# Patient Record
Sex: Male | Born: 1950 | Race: White | Hispanic: No | Smoking: Former smoker
Health system: Southern US, Community
[De-identification: ages and names within clinical notes are randomized; demographics above are authoritative.]

## PROBLEM LIST (undated history)

## (undated) DIAGNOSIS — I509 Heart failure, unspecified: Secondary | ICD-10-CM

## (undated) DIAGNOSIS — I10 Essential (primary) hypertension: Secondary | ICD-10-CM

## (undated) DIAGNOSIS — I2510 Atherosclerotic heart disease of native coronary artery without angina pectoris: Secondary | ICD-10-CM

## (undated) DIAGNOSIS — E785 Hyperlipidemia, unspecified: Secondary | ICD-10-CM

## (undated) DIAGNOSIS — Z87442 Personal history of urinary calculi: Secondary | ICD-10-CM

## (undated) DIAGNOSIS — E119 Type 2 diabetes mellitus without complications: Secondary | ICD-10-CM

## (undated) HISTORY — DX: Atherosclerotic heart disease of native coronary artery without angina pectoris: I25.10

## (undated) HISTORY — PX: BILATERAL KNEE ARTHROSCOPY: SUR91

## (undated) HISTORY — DX: Type 2 diabetes mellitus without complications: E11.9

## (undated) HISTORY — PX: NOSE SURGERY: SHX723

## (undated) HISTORY — DX: Essential (primary) hypertension: I10

## (undated) HISTORY — DX: Hyperlipidemia, unspecified: E78.5

## (undated) HISTORY — PX: LAPAROSCOPIC INGUINAL HERNIA REPAIR: SUR788

## (undated) HISTORY — DX: Heart failure, unspecified: I50.9

## (undated) HISTORY — PX: CORONARY ANGIOPLASTY WITH STENT PLACEMENT: SHX49

---

## 2000-10-26 ENCOUNTER — Emergency Department (HOSPITAL_COMMUNITY): Admission: EM | Admit: 2000-10-26 | Discharge: 2000-10-27 | Payer: Self-pay | Admitting: Emergency Medicine

## 2007-10-14 DIAGNOSIS — E785 Hyperlipidemia, unspecified: Secondary | ICD-10-CM

## 2007-10-14 DIAGNOSIS — E119 Type 2 diabetes mellitus without complications: Secondary | ICD-10-CM

## 2007-10-14 DIAGNOSIS — M109 Gout, unspecified: Secondary | ICD-10-CM

## 2007-10-14 DIAGNOSIS — I10 Essential (primary) hypertension: Secondary | ICD-10-CM | POA: Insufficient documentation

## 2007-10-14 HISTORY — DX: Essential (primary) hypertension: I10

## 2007-10-14 HISTORY — DX: Gout, unspecified: M10.9

## 2007-10-14 HISTORY — DX: Type 2 diabetes mellitus without complications: E11.9

## 2007-10-14 HISTORY — DX: Hyperlipidemia, unspecified: E78.5

## 2008-08-23 ENCOUNTER — Ambulatory Visit: Payer: Self-pay | Admitting: Internal Medicine

## 2008-08-23 ENCOUNTER — Inpatient Hospital Stay (HOSPITAL_COMMUNITY): Admission: EM | Admit: 2008-08-23 | Discharge: 2008-08-27 | Payer: Self-pay | Admitting: Emergency Medicine

## 2008-08-24 ENCOUNTER — Encounter (INDEPENDENT_AMBULATORY_CARE_PROVIDER_SITE_OTHER): Payer: Self-pay | Admitting: Internal Medicine

## 2008-09-16 ENCOUNTER — Ambulatory Visit: Payer: Self-pay | Admitting: Cardiology

## 2008-09-17 ENCOUNTER — Encounter (HOSPITAL_COMMUNITY): Admission: RE | Admit: 2008-09-17 | Discharge: 2008-11-16 | Payer: Self-pay | Admitting: Internal Medicine

## 2008-11-10 ENCOUNTER — Ambulatory Visit: Payer: Self-pay | Admitting: Internal Medicine

## 2009-06-15 ENCOUNTER — Ambulatory Visit: Payer: Self-pay | Admitting: Internal Medicine

## 2009-06-15 DIAGNOSIS — I10 Essential (primary) hypertension: Secondary | ICD-10-CM

## 2009-06-15 DIAGNOSIS — I2510 Atherosclerotic heart disease of native coronary artery without angina pectoris: Secondary | ICD-10-CM | POA: Insufficient documentation

## 2009-06-15 DIAGNOSIS — E785 Hyperlipidemia, unspecified: Secondary | ICD-10-CM

## 2009-06-15 HISTORY — DX: Essential (primary) hypertension: I10

## 2009-06-15 HISTORY — DX: Atherosclerotic heart disease of native coronary artery without angina pectoris: I25.10

## 2009-07-19 ENCOUNTER — Encounter: Payer: Self-pay | Admitting: Internal Medicine

## 2009-08-05 ENCOUNTER — Encounter: Payer: Self-pay | Admitting: Cardiology

## 2009-08-06 ENCOUNTER — Telehealth: Payer: Self-pay | Admitting: Internal Medicine

## 2009-08-09 ENCOUNTER — Encounter: Payer: Self-pay | Admitting: Internal Medicine

## 2009-09-30 ENCOUNTER — Telehealth: Payer: Self-pay | Admitting: Internal Medicine

## 2009-10-22 ENCOUNTER — Ambulatory Visit (HOSPITAL_COMMUNITY): Admission: RE | Admit: 2009-10-22 | Discharge: 2009-10-22 | Payer: Self-pay | Admitting: General Surgery

## 2009-11-23 ENCOUNTER — Encounter: Payer: Self-pay | Admitting: Internal Medicine

## 2010-03-04 ENCOUNTER — Encounter: Payer: Self-pay | Admitting: Internal Medicine

## 2010-03-29 ENCOUNTER — Telehealth: Payer: Self-pay | Admitting: Internal Medicine

## 2010-07-04 ENCOUNTER — Ambulatory Visit: Payer: Self-pay | Admitting: Internal Medicine

## 2010-12-22 NOTE — Assessment & Plan Note (Signed)
Summary: f1y   Visit Type:  Follow-up Primary Provider:  Ria Clock MD  CC:  no compliants.  History of Present Illness: Mr. Tremont is a very pleasant 60 year old male with history of coronary artery disease status post non-ST-elevation myocardial infarction, October 2008, treated with two drug-eluting stents to the RCA and a stent to the mid LAD.  EF was 55%.  Also, has a history of hypertension (with white-coat component), hyperlipidemia, and diabetes.   He returns today for routine followup.  He is doing very well.  He is walking and working in the yard without any chest pain or dyspnea.  Not exerising much the past month due to heat. He has been taking his blood pressure at home and his average pressures are being 120-130/70s.  His cholesterol and diabetes has been followed by Dr. Ilsa Iha. Recently HgBA1c 6.1. Unable to tolerate Niaspan due to severe flushing.   Current Medications (verified): 1)  Norvasc 10 Mg Tabs (Amlodipine Besylate) .... Take 1 Tablet By Mouth Once A Day 2)  Plavix 75 Mg Tabs (Clopidogrel Bisulfate) .... Take 1 Tablet By Mouth Once A Day 3)  Coreg Cr 40 Mg Xr24h-Cap (Carvedilol Phosphate) .... Take One Tablet By Mouth Once Daily. 4)  Glipizide 10 Mg Xr24h-Tab (Glipizide) .... Take One Tablet By Mouth Once Daily. 5)  Micardis 80 Mg Tabs (Telmisartan) .... Take One Tablet By Mouth Daily 6)  Lantus 100 Unit/ml Soln (Insulin Glargine) .... Uad 7)  Crestor 20 Mg Tabs (Rosuvastatin Calcium) .... Take One Tablet By Mouth Daily. 8)  Aspirin 81 Mg Tbec (Aspirin) .... Take One Tablet By Mouth Daily  Allergies (verified): 1)  ! * Pt Can Not Take Asa 325mg /bleeding 2)  * Fish Oil  Review of Systems       As per HPI and past medical history; otherwise all systems negative.    Vital Signs:  Patient profile:   60 year old male Height:      73 inches Weight:      224 pounds BMI:     29.66 Pulse rate:   69 / minute BP sitting:   148 / 70  (left arm) Cuff size:    regular  Vitals Entered By: Hardin Negus, RMA (July 04, 2010 2:15 PM)  Physical Exam  General:  Gen: well appearing. no resp difficulty HEENT: normal Neck: supple. no JVD. Carotids 2+ bilat; not bruits. No lymphadenopathy or thryomegaly appreciated. Cor: PMI nondisplaced. Regular rate & rhythm. No rubs, murmur. +s4 Lungs: clear Abdomen: obese. soft, nontender, nondistended. Good bowel sounds. Extremities: no cyanosis, clubbing, rash, tr edema on left Neuro: alert & orientedx3, cranial nerves grossly intact. moves all 4 extremities w/o difficulty. affect pleasant    Impression & Recommendations:  Problem # 1:  CAD, NATIVE VESSEL (ICD-414.01) He remains active with no evidence of ischemia. Continue current regimen. We discussed f/u treadmill for screening but he is active enough without symptoms that i don't feel strongly about it.   Problem # 2:  HYPERTENSION, BENIGN (ICD-401.1) Blood pressure well controlled. Continue current regimen.  Problem # 3:  HYPERLIPIDEMIA-MIXED (ICD-272.4) Recent TC was 94. Looks great. Continue Crestor.   Other Orders: EKG w/ Interpretation (93000)  Patient Instructions: 1)  Your physician wants you to follow-up in:  1 year.  You will receive a reminder letter in the mail two months in advance. If you don't receive a letter, please call our office to schedule the follow-up appointment.

## 2010-12-22 NOTE — Letter (Signed)
Summary: Dr Jim Desanctis Rosenbower's Office Note  Dr Jim Desanctis Rosenbower's Office Note   Imported By: Roderic Ovens 12/30/2009 13:08:51  _____________________________________________________________________  External Attachment:    Type:   Image     Comment:   External Document

## 2010-12-22 NOTE — Progress Notes (Signed)
Summary: Pt returning call  Phone Note Call from Patient Call back at [phone]   Caller: Patient Summary of Call: Pt returning  call Initial call taken by: Judie Grieve,  Mar 29, 2010 10:10 AM  Follow-up for Phone Call        Left message to call back Meredith Staggers, RN  Mar 29, 2010 2:32 PM   spoke w/pt regarding labwork he states he can not tolerate fish oil Meredith Staggers, RN  Mar 29, 2010 3:03 PM    New Allergies: * FISH OIL New Allergies: * FISH OIL

## 2011-02-09 ENCOUNTER — Observation Stay (HOSPITAL_COMMUNITY)
Admission: EM | Admit: 2011-02-09 | Discharge: 2011-02-10 | DRG: 143 | Disposition: A | Discharge status: Home Health | Payer: BC Managed Care – PPO | Attending: Internal Medicine | Admitting: Internal Medicine

## 2011-02-09 ENCOUNTER — Emergency Department (HOSPITAL_COMMUNITY): Payer: BC Managed Care – PPO

## 2011-02-09 DIAGNOSIS — E119 Type 2 diabetes mellitus without complications: Secondary | ICD-10-CM | POA: Insufficient documentation

## 2011-02-09 DIAGNOSIS — I2510 Atherosclerotic heart disease of native coronary artery without angina pectoris: Secondary | ICD-10-CM | POA: Insufficient documentation

## 2011-02-09 DIAGNOSIS — I498 Other specified cardiac arrhythmias: Secondary | ICD-10-CM | POA: Insufficient documentation

## 2011-02-09 DIAGNOSIS — R079 Chest pain, unspecified: Principal | ICD-10-CM | POA: Insufficient documentation

## 2011-02-09 DIAGNOSIS — F411 Generalized anxiety disorder: Secondary | ICD-10-CM | POA: Insufficient documentation

## 2011-02-09 DIAGNOSIS — Z794 Long term (current) use of insulin: Secondary | ICD-10-CM | POA: Insufficient documentation

## 2011-02-09 DIAGNOSIS — M109 Gout, unspecified: Secondary | ICD-10-CM | POA: Insufficient documentation

## 2011-02-09 DIAGNOSIS — I252 Old myocardial infarction: Secondary | ICD-10-CM | POA: Insufficient documentation

## 2011-02-09 DIAGNOSIS — E785 Hyperlipidemia, unspecified: Secondary | ICD-10-CM | POA: Insufficient documentation

## 2011-02-09 DIAGNOSIS — I10 Essential (primary) hypertension: Secondary | ICD-10-CM | POA: Insufficient documentation

## 2011-02-09 LAB — POCT I-STAT, CHEM 8
Calcium, Ion: 1.15 mmol/L (ref 1.12–1.32)
Chloride: 106 mEq/L (ref 96–112)
Creatinine, Ser: 1.3 mg/dL (ref 0.4–1.5)
Glucose, Bld: 77 mg/dL (ref 70–99)
HCT: 45 % (ref 39.0–52.0)
Potassium: 3.7 mEq/L (ref 3.5–5.1)
Sodium: 146 mEq/L — ABNORMAL HIGH (ref 135–145)

## 2011-02-09 LAB — POCT CARDIAC MARKERS: Troponin i, poc: <0.05 ng/mL (ref 0.00–0.09)

## 2011-02-10 DIAGNOSIS — R079 Chest pain, unspecified: Secondary | ICD-10-CM

## 2011-02-10 LAB — DIFFERENTIAL
Basophils Absolute: 0 10*3/uL (ref 0.0–0.1)
Lymphs Abs: 1.8 10*3/uL (ref 0.7–4.0)
Neutrophils Relative %: 69 % (ref 43–77)

## 2011-02-10 LAB — COMPREHENSIVE METABOLIC PANEL
ALT: 20 U/L (ref 0–53)
Albumin: 4.3 g/dL (ref 3.5–5.2)
Alkaline Phosphatase: 60 U/L (ref 39–117)
Creatinine, Ser: 1.13 mg/dL (ref 0.4–1.5)
GFR calc Af Amer: >60 mL/min (ref >60)
GFR calc non Af Amer: >60 mL/min (ref >60)
Glucose, Bld: 71 mg/dL (ref 70–99)
Total Protein: 6.7 g/dL (ref 6.0–8.3)

## 2011-02-10 LAB — CARDIAC PANEL(CRET KIN+CKTOT+MB+TROPI)
CK, MB: 1.7 ng/mL (ref 0.3–4.0)
Total CK: 80 U/L (ref 7–232)
Troponin I: 0.02 ng/mL (ref 0.00–0.06)

## 2011-02-10 LAB — TSH: TSH: 2.587 u[IU]/mL (ref 0.350–4.500)

## 2011-02-10 LAB — HEMOGLOBIN A1C: Hgb A1c MFr Bld: 6.6 % — ABNORMAL HIGH (ref <5.7)

## 2011-02-10 LAB — GLUCOSE, CAPILLARY

## 2011-02-10 LAB — CK TOTAL AND CKMB (NOT AT ARMC)
CK, MB: 1.7 ng/mL (ref 0.3–4.0)
Relative Index: INVALID (ref 0.0–2.5)
Total CK: 98 U/L (ref 7–232)

## 2011-02-10 LAB — CBC
Hemoglobin: 15.6 g/dL (ref 13.0–17.0)
MCH: 31.5 pg (ref 26.0–34.0)

## 2011-02-10 LAB — LIPID PANEL
LDL Cholesterol: 33 mg/dL (ref 0–99)
Triglycerides: 149 mg/dL (ref <150)
VLDL: 30 mg/dL (ref 0–40)

## 2011-02-10 LAB — TROPONIN I: Troponin I: 0.01 ng/mL (ref 0.00–0.06)

## 2011-02-10 LAB — HEPARIN LEVEL (UNFRACTIONATED): Heparin Unfractionated: 0.3 IU/mL (ref 0.30–0.70)

## 2011-02-20 ENCOUNTER — Ambulatory Visit (HOSPITAL_COMMUNITY): Payer: BC Managed Care – PPO | Attending: Internal Medicine | Admitting: Radiology

## 2011-02-20 DIAGNOSIS — R079 Chest pain, unspecified: Secondary | ICD-10-CM | POA: Insufficient documentation

## 2011-02-20 DIAGNOSIS — I252 Old myocardial infarction: Secondary | ICD-10-CM

## 2011-02-20 DIAGNOSIS — R0789 Other chest pain: Secondary | ICD-10-CM

## 2011-02-20 DIAGNOSIS — E119 Type 2 diabetes mellitus without complications: Secondary | ICD-10-CM

## 2011-02-20 DIAGNOSIS — I2510 Atherosclerotic heart disease of native coronary artery without angina pectoris: Secondary | ICD-10-CM

## 2011-02-20 DIAGNOSIS — R0989 Other specified symptoms and signs involving the circulatory and respiratory systems: Secondary | ICD-10-CM

## 2011-02-20 MED ORDER — TECHNETIUM TC 99M TETROFOSMIN IV KIT
33.0000 | PACK | Freq: Once | INTRAVENOUS | Status: AC | PRN
  Administered 2011-02-20: 33 via INTRAVENOUS

## 2011-02-20 MED ORDER — TECHNETIUM TC 99M TETROFOSMIN IV KIT
11.0000 | PACK | Freq: Once | INTRAVENOUS | Status: AC | PRN
  Administered 2011-02-20: 11 via INTRAVENOUS

## 2011-02-20 NOTE — Progress Notes (Signed)
[facility] SITE 3 NUCLEAR MED [address] [place] [address] [phone]  Cardiology Nuclear Med Study  [name] [name] is a 60 y.o. male [id number] 12-31-1950 60 y.o.   Nuclear Med Background Indication for Stress Test:  Evaluation for Ischemia, Stent Patency and 02/10/11 Post Hospital: CP, R/O MI History: '09 Echo EF=55%,'09 NSTEMI>Heart Catheterization>PTCA/ Stents(RCA), residual mild-moderate CAD, EF=60% Cardiac Risk Factors: Family History - CAD, History of Smoking, Hypertension, IDDM Type 2 and Lipids  Symptoms:  Chest Pressure with Exertion, Dizziness, DOE, Fatigue, Fatigue with Exertion, Light-Headedness and Near Syncope   Nuclear Pre-Procedure Caffeine/Decaff Intake:  7:00pm NPO After: 8:00am   Lungs:  Clear IV 0.9% NS with Angio Cath:  18g  IV Site: R Antecubital  IV Started by:  Stanton Kidney, EMT-P  Chest Size (in):  46 Cup Size: n/a  Height: 6' (1.829 m)  Weight:  228 lb (103.42 kg)  BMI:  Body mass index is 30.92 kg/(m^2). Tech Comments:  Coreg held x 4 days, per patient.    Nuclear Med Study 1 or 2 day study: 1 day  Stress Test Type:  Stress  Reading MD: Dietrich Pates, MD  Order Authorizing Provider:  Dr. Arvilla Meres, MD  Resting Radionuclide: Technetium 23m Tetrofosmin  Resting Radionuclide Dose: 11 mCi   Stress Radionuclide:  Technetium 25m Tetrofosmin  Stress Radionuclide Dose: 33 mCi           Stress Protocol Rest HR: 67 Stress HR: 160  Rest BP: 151/81 Stress BP: 220/84  Exercise Time (min): 8:01 METS: 10.1          Dose of Adenosine (mg):  n/a Dose of Lexiscan: n/a mg  Dose of Atropine (mg): n/a Dose of Dobutamine:  n/a mcg/kg/min (at max HR)  Stress Test Technologist: Irean Hong, RN  Nuclear Technologist:  Doyne Keel, CNMT     Rest Procedure:  Myocardial perfusion imaging was performed at rest 45 minutes following the intravenous administration of Technetium 39m Tetrofosmin. Rest ECG: NSR  Stress Procedure:   The patient exercised for 8 minutes and 01 seconds.  The patient stopped due to DOE and denied any chest pain.  There were nonspecific ST-T wave changes. There was a hypertensive response to exercise. There were frequent PAC's, occasion PVC's, and 6 beat PSVT.  Technetium 35m Tetrofosmin was injected at peak exercise and myocardial perfusion imaging was performed after a brief delay. Stress ECG: No significant change from baseline ECG  QPS Raw Data Images:  Images were motion corrected.  SOft tissue (subcutaneous fat, diaphragm) surround heart. Stress Images:  Defect in the inferior wall (base,minimally mid), inferoseptal wall (base, mid) and apex.  Otherwise normal perfusion. Rest Images:  Incomplete improvement with increased counts in the mid inferior, mid inferoseptal wall. Subtraction (SDS):  Mild ischemia. Transient Ischemic Dilatation (Normal <1.22):  0.96 Lung/Heart Ratio (Normal <0.45):  0.37  Quantitative Gated Spect Images QGS EDV:  137 ml QGS ESV:  77 ml QGS cine images:  Hypokinesis in the inferior and septal walls. QGS EF: 44%  Impression Exercise Capacity:  Good exercise capacity. BP Response:  Normal blood pressure response. Clinical Symptoms:  No chest pain. ECG Impression:  No significant ST segment change suggestive of ischemia. Comparison with Prior Nuclear Study: No previous nuclear study performed  Overall Impression:  Scar with mild periinfarct ischemia in the inferior, inferoseptal walls.  Cannot exclude coexistent soft tissue attenuation.  Thinning at apex.  Consider echo to further define EF.    [name]  [employer]

## 2011-02-21 LAB — GLUCOSE, CAPILLARY: Glucose-Capillary: 146 mg/dL — ABNORMAL HIGH (ref 70–99)

## 2011-02-21 LAB — COMPREHENSIVE METABOLIC PANEL
Calcium: 9.7 mg/dL (ref 8.4–10.5)
Chloride: 108 mEq/L (ref 96–112)
GFR calc Af Amer: >60 mL/min (ref >60)
Glucose, Bld: 119 mg/dL — ABNORMAL HIGH (ref 70–99)
Sodium: 145 mEq/L (ref 135–145)

## 2011-02-21 LAB — CBC
HCT: 47.7 % (ref 39.0–52.0)
Hemoglobin: 16.8 g/dL (ref 13.0–17.0)
MCHC: 35.1 g/dL (ref 30.0–36.0)
Platelets: 119 10*3/uL — ABNORMAL LOW (ref 150–400)
RBC: 5.08 MIL/uL (ref 4.22–5.81)
RDW: 12.7 % (ref 11.5–15.5)
WBC: 5.9 10*3/uL (ref 4.0–10.5)

## 2011-02-21 LAB — DIFFERENTIAL
Basophils Relative: 1 % (ref 0–1)
Eosinophils Absolute: 0.2 10*3/uL (ref 0.0–0.7)
Eosinophils Relative: 3 % (ref 0–5)
Monocytes Absolute: 0.4 10*3/uL (ref 0.1–1.0)
Monocytes Relative: 7 % (ref 3–12)

## 2011-02-21 NOTE — Progress Notes (Signed)
NUC REPORT ROUTED TO DR. Gala Romney.Mirna Mires

## 2011-02-21 NOTE — Progress Notes (Signed)
Probably ok. Please pull images for me to review. Also check 2-d echo to assess EF better.

## 2011-02-22 ENCOUNTER — Other Ambulatory Visit: Payer: Self-pay | Admitting: Internal Medicine

## 2011-03-01 NOTE — Progress Notes (Signed)
Per Dr Gala Romney, he reviewed images would pt to have an office visit to discuss,

## 2011-03-01 NOTE — Progress Notes (Signed)
Left message to call back  

## 2011-03-02 ENCOUNTER — Telehealth: Payer: Self-pay | Admitting: Internal Medicine

## 2011-03-02 NOTE — Telephone Encounter (Signed)
Left message to call back  

## 2011-03-02 NOTE — Telephone Encounter (Signed)
Pt aware test abn. appt sch for Mon 4/16 at 3pm with Dr Gala Romney

## 2011-03-02 NOTE — Progress Notes (Signed)
Pt aware appt sch fro 4/16 at 3pm

## 2011-03-06 ENCOUNTER — Encounter: Payer: Self-pay | Admitting: Internal Medicine

## 2011-03-06 ENCOUNTER — Ambulatory Visit (INDEPENDENT_AMBULATORY_CARE_PROVIDER_SITE_OTHER): Payer: BC Managed Care – PPO | Admitting: Internal Medicine

## 2011-03-06 VITALS — BP 152/84 | HR 66 | Ht 72.0 in | Wt 228.0 lb

## 2011-03-06 DIAGNOSIS — I10 Essential (primary) hypertension: Secondary | ICD-10-CM

## 2011-03-06 DIAGNOSIS — I2510 Atherosclerotic heart disease of native coronary artery without angina pectoris: Secondary | ICD-10-CM

## 2011-03-06 MED ORDER — CARVEDILOL PHOSPHATE ER 10 MG PO CP24: 10.0000 mg | ORAL_CAPSULE | Freq: Every day | ORAL | Status: DC

## 2011-03-06 MED ORDER — SPIRONOLACTONE 25 MG PO TABS: ORAL_TABLET | ORAL | Status: DC

## 2011-03-06 NOTE — Patient Instructions (Signed)
Decrease Coreg CR 10mg  daily Start Spironolactone 25mg  1/2 tab daily Your physician recommends that you return for lab work in: 1 week (bmet 401.1) Your physician wants you to follow-up in: 6 months. You will receive a reminder letter in the mail two months in advance. If you don't receive a letter, please call our office to schedule the follow-up appointment.

## 2011-03-06 NOTE — Progress Notes (Signed)
HPI:  [name] is a very pleasant 60 year old male with history of coronary artery disease status post non-ST-elevation myocardial infarction, October 2008, treated with two drug-eluting stents to the RCA and a stent to the mid LAD.  EF was 55%.  Also, has a history of hypertension (with white-coat component), hyperlipidemia, and diabetes.  He was admitted in March with dizziness and staggering after mowing the grass. Also had some tightness. HR on admit was in low 40s and we cut Coreg back. Ruled out for MI with serial cardiac markers. Had f/u ETT/Myoview on 02/20/11, Walked 8:01. Stopped due to dyspnea. No CP.  BP 164/93 at baseline.went up to 220/84 in stage I. ECG normal. Images with mild reversible defect at the base of the infero-septal wall.   Returns today to discuss results. Feeling much better since Coreg cut back. Mowing the grass and doing other activities with no CP or dyspnea.   Following BP at home SBP 120-135. HRs in 50s    ROS: All systems negative except as listed in HPI, PMH and Problem List.  Past Medical History  Diagnosis Date  . CAD (coronary artery disease)     nonST elevated MI in Oct 2008 treated w/2 drug -eluting stents to the RCA and  mid LAD, EF 55%  . HTN (hypertension)   . Hyperlipidemia   . DM (diabetes mellitus)     Current Outpatient Prescriptions  Medication Sig Dispense Refill  . amLODipine (NORVASC) 10 MG tablet Take 10 mg by mouth daily.        Marland Kitchen aspirin 81 MG tablet Take 81 mg by mouth daily.        . carvedilol (COREG CR) 40 MG 24 hr capsule Take 40 mg by mouth daily.        Marland Kitchen glipiZIDE (GLUCOTROL) 10 MG tablet Take 10 mg by mouth daily.        . insulin glargine (LANTUS) 100 UNIT/ML injection Inject into the skin as directed.        Marland Kitchen PLAVIX 75 MG tablet TAKE ONE TABLET BY MOUTH ONE TIME DAILY  30 each  7  . rosuvastatin (CRESTOR) 20 MG tablet Take 20 mg by mouth daily.        Marland Kitchen telmisartan (MICARDIS) 80 MG tablet Take 80 mg by mouth daily.            PHYSICAL EXAM: Filed Vitals:   03/06/11 1535  BP: 152/84  Pulse: 66   General:  Well appearing. No resp difficulty HEENT: normal Neck: supple. JVP flat. Carotids 2+ bilaterally; no bruits. No lymphadenopathy or thryomegaly appreciated. Cor: PMI normal. Regular rate & rhythm. No rubs, gallops or murmurs. Lungs: clear Abdomen: soft, nontender, nondistended. No hepatosplenomegaly. No bruits or masses. Good bowel sounds. Extremities: no cyanosis, clubbing, rash, edema Neuro: alert & orientedx3, cranial nerves grossly intact. Moves all 4 extremities w/o difficulty. Affect pleasant.    ECG:   ASSESSMENT & PLAN:

## 2011-03-06 NOTE — Assessment & Plan Note (Signed)
We reviewed stress test together and I told him I felt this was very low-risk. As his symptoms have totally resolved with decrease of Coreg would not pursue cath at this time. Continue RF management.

## 2011-03-06 NOTE — Assessment & Plan Note (Signed)
He has a significant white-coat component. Given how much better he feels on lower dose of Coreg will decrease down to 10 qd and add spiro 12.5 daily. Check labs 1 week. Keep on eye BP.

## 2011-03-15 ENCOUNTER — Other Ambulatory Visit (INDEPENDENT_AMBULATORY_CARE_PROVIDER_SITE_OTHER): Payer: BC Managed Care – PPO | Admitting: *Deleted

## 2011-03-15 DIAGNOSIS — I10 Essential (primary) hypertension: Secondary | ICD-10-CM

## 2011-03-16 LAB — BASIC METABOLIC PANEL
Calcium: 9.3 mg/dL (ref 8.4–10.5)
Creatinine, Ser: 1.1 mg/dL (ref 0.4–1.5)
GFR: 73.79 mL/min (ref >60.00)
Sodium: 145 mEq/L (ref 135–145)

## 2011-03-17 NOTE — H&P (Signed)
NAME:  [name] [name], [name] [name] NO.:  [id number]  MEDICAL RECORD NO.:  [id number]           PATIENT TYPE:  E  LOCATION:  [facility]                         FACILITY:  [facility]  PHYSICIAN:  Rollene Rotunda, MD, FACCDATE OF BIRTH:  [birth year]  DATE OF ADMISSION:  02/09/2011 DATE OF DISCHARGE:                             HISTORY & PHYSICAL   PRIMARY PHYSICIAN:  Dalbert Mayotte, MD  CARDIOLOGIST:  Bevelyn Buckles. Bensimhon, MD  REASON FOR PRESENTATION:  Evaluate the patient with chest pain and known coronary artery disease.  HISTORY OF PRESENT ILLNESS:  The patient is a 60 year old with history of coronary artery disease as described below.  He has been having weakness for couple of days.  He has felt lethargic and slightly dizzy. He did see his primary care doctor and labs were pending.  Today, he had some chest discomfort.  This was while mowing his lawn.  He had some mild tightness like he could not get a deep breath.  He was a little bit dyspneic.  He was anxious.  He did not have any excessive diaphoresis, nausea or vomiting.  There was no arm or jaw discomfort.  He thought it was slightly reminiscent of his previous angina though this was mild. He had otherwise been active, walking up until about a week ago.  He had not had any discomfort with this.  He has not had any resting shortness of breath, PND or orthopnea.  He was having some discomfort in the emergency room without acute EKG changes.  Point-of-care markers were negative x1.  He currently is pain free.  He did receive aspirin and a sublingual nitroglycerin.  He is not sure when the pain disappeared.  He does report increased anxiety recently.  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  PAST SURGICAL HISTORY:  Hemorrhoids, nasal surgery, inguinal hernia repair x2, right elbow surgery.  ALLERGIES:  Intolerances none.  MEDICATIONS: 1. Aspirin 81 mg daily. 2. NovoLog insulin 17 units nightly. 3. Micardis 80 mg daily. 4. Glipizide 10 mg daily. 5. Crestor 20 mg daily. 6. Plavix 75 mg daily. 7. Norvasc 10 mg daily. 8. Coreg 40 mg daily.  SOCIAL HISTORY:  The patient is married.  He does not smoke cigarettes, quitting 25 years ago.  FAMILY HISTORY:  Contributory for his father dying with myocardial infarction age 84.  REVIEW OF SYSTEMS:  As stated in the HPI negative for all other systems.  PHYSICAL EXAMINATION:  GENERAL:  The patient is pleasant and in no distress. VITAL SIGNS:  Blood pressure 158/86, heart rate 64 and regular, afebrile, respiratory rate 16. HEENT:  Eyes are unremarkable.  Pupils equal, round, and reactive to light.  Fundi not visualized.  Oral mucosa unremarkable. NECK:  No jugular venous distention at 45 degrees.  Carotid upstroke brisk and symmetrical.  No bruits and no thyromegaly.  LYMPHATICS:  No cervical,  axillary, or inguinal adenopathy. LUNGS:  Clear to auscultation bilaterally. BACK:  No costovertebral angle tenderness. CHEST:  Unremarkable. HEART:  PMI not displaced or sustained.  S1 and S2 within normal limits. No S3.  No S4.  No clicks, rubs, or murmurs.  ABDOMEN:  Flat, positive bowel sounds normal in frequency and pitch.  No bruits, no rebound, no guarding, no midline pulsatile mass, no hepatomegaly, no splenomegaly. SKIN:  No rashes.  No nodules. EXTREMITIES:  Pulses 2+ throughout.  No edema, no cyanosis, no clubbing. NEURO:  Oriented to person, place, and time.  Cranial nerves II through XII are grossly intact.  Motor grossly intact.  EKG:  Sinus bradycardia, rate 50, axis within normal  limits, intervals within normal limits, no acute ST-T wave changes, bifid T-wave.  LABORATORY DATA:  Point-of-care markers negative x1.  Sodium 146, potassium 3.7, BUN 17, creatinine 1.3.  CHEST X-RAY:  No acute disease.  ASSESSMENT AND PLAN: 1. Chest comfort.  The patient's chest discomfort has atypical greater     than typical features, though it is somewhat reminiscent to this     previous angina.  The predominance of complaints have been     dizziness and lethargy.  At this point, given his problems with     catheterization previously, if he does not have any objective     evidence of ischemia, I would suggest inpatient stress perfusion     imaging prior to discharge.  If, however, of course he has any     acute EKG changes or enzyme elevations, he would need cardiac     catheterization.  He might suggest holding his ARB at that time and     hydrating.  He needs continued risk reduction. 2. Diabetes.  He will continue his previous insulin regimen with     sliding scale coverage.  We will check hemoglobin A1c. 3. Dyslipidemia.  I will continue his current statin and check a     fasting lipid profile. 4. Bradycardia.  He has been lightheaded and fatigued.  His heart rate     at home has been in the 60s to 50s.  His     blood pressures have been controlled.  He will likely need his dose     of carvedilol reduced at discharge.  I will hold it in the hospital     for heart rate less than 55.  He will have a TSH. 5. Thrombocytopenia.  CBC is pending.     Rollene Rotunda, MD, [facility]     JH/MEDQ  D:  02/10/2011  T:  02/10/2011  Job:  [id number]  Electronically Signed by Rollene Rotunda MD [facility] on 03/17/2011 11:43:25 AM

## 2011-03-21 NOTE — Discharge Summary (Signed)
NAME:  [name] [name] NO.:  [id number]  MEDICAL RECORD NO.:  [id number]           PATIENT TYPE:  I  LOCATION:  2011                         FACILITY:  [facility]  PHYSICIAN:  Bevelyn Buckles. Kathryn Cosby, MDDATE OF BIRTH:  [birth year]  DATE OF ADMISSION:  02/09/2011 DATE OF DISCHARGE:  02/10/2011                              DISCHARGE SUMMARY   PRIMARY CARE PHYSICIAN:  Dalbert Mayotte, MD  PRIMARY CARDIOLOGIST:  Bevelyn Buckles. Artie Mcintyre, MD  DISCHARGE DIAGNOSES: 1. Chest pain, low risk for cardiac etiology.     a.     Ruled out with serial negative cardiac enzymes, stress      Myoview scheduled for February 20, 2011, at [facility]. 2. Elevated TSH.     a.     TSH 5.306, symptoms consistent with hypothyroidism, follow      up with primary care physician. 3. Bradycardia.     a.     Long-acting Coreg, decreased from 40 to 20 mg p.o. daily.  SECONDARY DIAGNOSES: 1. Coronary artery disease.a.     Status post non-ST segment elevation myocardial infarction,      August 2009, obtuse marginal 40%, diagonal mid 70%, distal 90%,      stenosis, Atrioventricular groove 50% stenosis, right coronary      artery occluded, percutaneous coronary intervention of right      coronary artery with two overlapping drug-eluting stents. 2. Insulin-dependent diabetes mellitus. 3. Hypertension. 4. Hyperlipidemia. 5. Gout. 6. Anxiety. 7. Mild thrombocytopenia. 8. Renal insufficiency (following catheterization).  ALLERGIES:  NKDA.  PROCEDURES: 1. EKG, February 09, 2011:  Sinus bradycardia, 50 bpm, no acute ST-T wave     changes or significant Q-waves.  Normal axis, no evidence of     hypertrophy, PR 166, QRS 98, and QTc of 419. 2. Chest x-ray, February 09, 2011:  No acute disease. 3. EKG, February 10, 2011:  No significant change from prior tracing.  HISTORY OF PRESENT ILLNESS:  [name] is a 60 year old gentleman with the above-noted complex medical history who presented to [facility] ED in the late  evening of February 09, 2011, complaining of chest discomfort as well as weakness, lethargy, and dizziness.  He saw his primary care physician recently and labs were done, but he is unaware of these results.  On the day of his presentation, he was mowing his lawn and had chest tightness and felt like he could not take a deep breath.  He was mildly dyspneic and anxious.  No diaphoresis, nausea, or vomiting.  No radiation to the arm or jaw.  He thought it was somewhat reminiscent of his prior angina and decided to present to [facility] ED for further evaluation.  The patient denies any symptoms recently with activity including walking.  No resting shortness of breath, PND, or orthopnea either.  When evaluated by Cardiology, he continued to have some mild chest discomfort but was without objective evidence of cardiac etiology (no EKG changes or elevation to initial point-of-care markers).  He was treated with aspirin and sublingual nitroglycerin in the emergency department.  He is unsure of when his pain totally resolved, but did resolve in the  emergency department.  He has had increased anxiety recently as well.  HOSPITAL COURSE:  The patient was admitted and was ruled out with serial negative cardiac enzymes.  He was seen in the morning of February 10, 2011, by his primary cardiologist, Dr. Arvilla Meres.  Dr. Gala Romney felt that his chest pain was mostly atypical and as the patient was anxious to get home, it was felt that he was appropriate for outpatient risk stratification with a nuclear treadmill stress Myoview.  The patient had continued bradycardia down to the 40's even off his beta-blocker.  The patient had his long-acting Coreg decreased from 40 to 20 mg p.o. daily. At the time of discharge, the patient received his new medication list, followup instructions including instructions to follow up with primary care provider (TSH noted to be slightly elevated and symptoms consistent with  hypothyroidism) in 1-2 weeks.  The patient has treadmill Myoview on February 20, 2011, at 12:30 p.m.  He also had all of his questions and concerns answered prior to his leaving the hospital.  DISCHARGE LABORATORIES:  WBC is 8.7, HGB 15.6, HCT 44.6, PLT count is 136, WBC differential was within normal limits.  Sodium 144, potassium 3.8, chloride 109, bicarb 30, BUN 15, creatinine 1.13, glucose 71. Liver function tests within normal limits.  Total protein 6.7, albumin 4.3, calcium 9.3.  Point-of-care markers negative x1 and two full sets of cardiac enzymes also negative.  Total cholesterol 93, triglycerides 149, HDL 30, LDL 33, total cholesterol/HDL ratio of 3.1.  TSH 5.306.  FOLLOWUP PLANS AND APPOINTMENTS: 1. Dalbert Mayotte, MD (primary care physician), the patient will call     and set up appointment in the next 1-2 weeks. 2. [facility], nuclear treadmill stress test, February 20, 2011,     at 12:30 p.m.  DISCHARGE MEDICATIONS: 1. Coreg CR 20 mg p.o. daily. 2. Aspirin 81 mg p.o. nightly. 3. Crestor 20 mg p.o. nightly. 4. Glipizide XL 10 mg p.o. nightly. 5. Lantus insulin 17 units subcu injection nightly. 6. Micardis 80 mg 1 tablet p.o. nightly. 7. Norvasc 10 mg p.o. nightly. 8. Plavix 75 mg p.o. daily.  DURATION OF DISCHARGE ENCOUNTER INCLUDING PHYSICIAN TIME:  35 minutes.     Jarrett Ables, PAC   ______________________________ Bevelyn Buckles. Saryna Kneeland, MD    MS/MEDQ  D:  02/10/2011  T:  02/11/2011  Job:  [id number]  cc:   Dalbert Mayotte, M.D.  Electronically Signed by Jarrett Ables PAC on 02/18/2011 02:49:10 PM Electronically Signed by Arvilla Meres MD on 03/21/2011 07:08:07 PM

## 2011-04-04 NOTE — Assessment & Plan Note (Signed)
[facility]                            CARDIOLOGY OFFICE NOTE   NAME:Powell, DARRIS STAIGER                   MRN:          [id number]  DATE:11/10/2008                            DOB:          06/28/1951    PRIMARY CARE PHYSICIAN:  Dalbert Mayotte, MD   INTERVAL HISTORY:  Mr. Dommer is a very pleasant 60 year old male with  history of coronary artery disease status post non-ST-elevation  myocardial infarction, October 2008, treated with two drug-eluting  stents to the RCA and a stent to the mid LAD.  EF was 55%.  Also, has a  history of hypertension, hyperlipidemia, and diabetes.   He returns today for routine followup.  He is doing well.  He is  exercising without any chest pain or dyspnea.  He has been taking his  blood pressure at home and his average pressures are being 130/70.  His  cholesterol has been followed by Dr. Drue Second.  He tells me recently that  his total cholesterol has been in the 80s.   CURRENT MEDICATIONS:  1. Aspirin 81 a day.  2. Plavix 75 a day.  3. Crestor 20 a day.  4. Coreg CR 40 a day.  5. Norvasc 5 a day.  6. Niacin 1000 mg a day.  7. Glipizide 10 a day.  8. Micardis 80 a day.  9. Lantus insulin.   PHYSICAL EXAMINATION:  GENERAL:  He is well-appearing in no acute  distress.  He ambulates around the clinic without respiratory  difficulty.  VITAL SIGNS:  Blood pressure is 150/86, heart rate 75.  HEENT:  Normal.  NECK:  Supple.  No JVD.  CARDIAC:  PMI is nondisplaced.  Irregular rate and rhythm.  No S4, no  murmur.  LUNGS:  Clear.  ABDOMEN:  Obese, nontender, nondistended.  No hepatosplenomegaly, no  bruits, no masses.  Good bowel sounds.  EXTREMITIES:  Warm.  No cyanosis, clubbing, or edema.  No rash.  NEURO:  Alert and oriented x3.  Cranial nerves II through XII intact.  Moves all 4 extremities without difficulty.  Affect is pleasant.   ASSESSMENT AND PLAN:  1. Coronary artery disease.  He is doing well.  No  evidence of      ischemia.  Continue current therapy.  2. Hypertension.  Blood pressure is mildly elevated, increase Norvasc      to 10.  3. Hyperlipidemia, followed by Dr. Drue Second.  It sounds like his lipids      are well controlled.  Goal LDL is less than 70.   DISPOSITION:  We will see him back in 1 year for routine followup.     Bevelyn Buckles. Bensimhon, MD  Electronically Signed    DRB/MedQ  DD: 11/10/2008  DT: 11/11/2008  Job #: [id number]   cc:   Dalbert Mayotte, M.D.

## 2011-04-04 NOTE — Cardiovascular Report (Signed)
NAME:  MCKADE, GURKA NO.:  [id number]   MEDICAL RECORD NO.:  [id number]          PATIENT TYPE:  INP   LOCATION:  2807                         FACILITY:  [facility]   PHYSICIAN:  Bevelyn Buckles. Bensimhon, MDDATE OF BIRTH:  [birth year]   DATE OF PROCEDURE:  08/24/2008  DATE OF DISCHARGE:                            CARDIAC CATHETERIZATION   PATIENT IDENTIFICATION:  Mr. Kliebert is well as a very pleasant 60-year-  old male with a history of hypertension and hyperlipidemia, as well as  diabetes, but no known history of coronary artery disease who was  admitted with a non-ST elevation myocardial infarction.   PROCEDURES PERFORMED:  1. Selective coronary angiography.  2. Left heart cath.  3. Left ventriculogram.   DESCRIPTION OF PROCEDURE:  The risks and indications of the  catheterization were explained.  Consent was signed and placed on the  chart.  A 6-French arterial sheath was placed in the right femoral  artery using the modified Seldinger technique.  Standard catheters  including JL-4, JR-4 and angled pigtail were used for the procedure.  All catheter exchanges were made over wire.  No apparent complications.   HEMODYNAMICS:  Central aortic pressure 94/57 with a mean of 74.  LV  pressure 97/11 with an EDP of 20.  There was no aortic stenosis.   Left main had a 40% mid-lesion and a 40% distal lesion.   LAD was a long vessel wrapping the apex.  It gave off a large diagonal  in the proximal portion.  There was mild disease proximally.  There was  a long 70% lesion in the midsection after the first diagonal.  Within  that section, there was a high-grade 90% stenosis.  There was mild to  moderate diffuse disease in the distal LAD.   Left circumflex was made up of an OM-1, an OM-2 and a small OM-3.  There  was a 50% tubular lesion in the mid AV groove circ and moderate diffuse  disease distally.  No high-grade stenoses.   Right coronary artery was totally occluded  proximally.  There was  significant calcification in the midsection.  There were left-to-right  collaterals filling the distal RCA.   Left ventriculogram done in the RAO position showed ejection fraction of  60% with a small area of inferior basilar akinesis.   ASSESSMENT:  1. Three-vessel coronary artery disease with totally occluded RCA and      high-grade LAD lesion.  2. Normal left ventricular function.   PLAN/DISCUSSION:  It is a bit unclear what is the culprit artery,  whether the right coronary is freshly occluded or whether that is more  chronic and the LAD is the culprit.  At this point, I think we have to  assume the RCA is the culprit.  He will undergo angioplasty of his RCA  with Dr. Juanda Chance today with a staged angioplasty of the LAD on Wednesday.  He will need aggressive risk factor modification.      Bevelyn Buckles. Bensimhon, MD  Electronically Signed     DRB/MEDQ  D:  08/24/2008  T:  08/24/2008  Job:  [id number]

## 2011-04-04 NOTE — Cardiovascular Report (Signed)
NAME:  ORMAN, [name] [name] NO.:  [id number]   MEDICAL RECORD NO.:  [id number]          PATIENT TYPE:  INP   LOCATION:  6525                         FACILITY:  [facility]   PHYSICIAN:  Everardo Beals. Juanda Chance, MD, FACCDATE OF BIRTH:  [birth year]   DATE OF PROCEDURE:  08/24/2008  DATE OF DISCHARGE:                            CARDIAC CATHETERIZATION   CLINICAL HISTORY:  Mr. Nikolai is 60 years old and has no prior history  of known heart disease.  Although, he does have a history of  hypertension and diabetes and hyperlipidemia.  He was admitted to the  hospital with a non-ST-elevation myocardial infarction and studied  earlier today by Dr. Gala Romney and found to have a total occlusion of  the right coronary artery and 90% stenosis in the mid LAD.  We elected  to treat the right coronary artery today.   PROCEDURE:  The procedure was performed via the right femoral arteries,  an arterial sheath, and a JR-4 6-French guiding catheter with side  holes.  The patient given Angiomax bolus and infusion and was loaded  with 600 mg of Plavix and had previously received 4 chewable aspirin.  We were able to cross the lesion with a Prowater wire without too much  difficulty.  We predilated the lesion with a 2.25 x 20 mm apex balloon  performing multiple inflations in the proximal and mid vessel.  We then  upgraded to a 2.5 x 20 mm balloon and performed multiple inflations in  the proximal and mid vessel up to 10 atmospheres for 30 seconds each.  We then deployed a 3.0 x 24 mm PROMUS stent in the midportion of the  vessel, deploying this with 1 inflation of 15 atmospheres for 30  seconds.  We then deployed a 3.5 x 24 mm PROMUS stent proximal to the  previous stent and overlapping the previous stent and located in the  proximal and proximal of the mid vessel.  We deployed this with 1  inflation of 12 atmospheres for 30 seconds.  We then postdilated both  stents with a 3.5 x 20 mm [place] Voyager  performing 4 inflations up to 18  atmospheres for 30 seconds.  Final diagnostics were then performed  through the guiding catheter.  The patient tolerated the procedure well  and left the laboratory in satisfactory condition.   RESULTS:  Initially, the right coronary artery was totally occluded, the  proximal vessel with TIMI 1 flow distally.  Following stenting, this  improved to 0% with TIMI 3 flow.   CONCLUSION:  Successful PCI of the totally occluded right coronary  artery using 2 overlapping PROMUS drug-eluting stents with improvement  of central narrowing from 99% to 0% improvement of the flow from TIMI 1  to TIMI 3 flow.   DISPOSITION:  The patient returned to Post-Angioplasty Unit.  We will  plan intervention on the LAD on Wednesday.      Bruce Elvera Lennox Juanda Chance, MD, [facility]  Electronically Signed     BRB/MEDQ  D:  08/24/2008  T:  08/25/2008  Job:  [id number]   cc:   Bevelyn Buckles. Bensimhon, MD  Dalbert Mayotte, M.D.

## 2011-04-04 NOTE — Assessment & Plan Note (Signed)
[facility]                            CARDIOLOGY OFFICE NOTE   NAME:[name], [name] ALSIP                   MRN:          [id number]  DATE:09/16/2008                            DOB:          [birth year]    PRIMARY CARDIOLOGIST:  Bevelyn Buckles. Bensimhon, MD   PRIMARY CARE PHYSICIAN:  Dalbert Mayotte, MD   [name] [name] is a 61 year old married white male patient who had a non-  ST-elevation MI on August 24, 2007, treated with two drug-eluting stents  to the RCA, as well as a stent to the mid LAD by Dr. Charlies Constable.  It  was difficult to tell which was the culprit artery, but they felt the  RCA was most likely the culprit.  The patient had normal LV function and  followup 2D echo confirmed this with an ejection fraction of 55% with no  wall motion abnormalities.  He had left ventricular wall thickness with  mildly-to-moderately increased.   Since the patient has been home, he has had no further chest pain,  shortness of breath, palpitations, dizziness or presyncope.  He is  walking 30 minutes a day and doing well.  He is very nervous about  coming to our office today and his blood pressure is elevated, although  he says at home it has been in the 120-130 over 60-70 range.  The  patient says his sugars have been running high and he has an appointment  with his primary care physician and most likely will be started on  insulin.   CURRENT MEDICATIONS:  1. Aspirin 81 mg daily.  2. Plavix 75 mg daily.  3. Glipizide 5 mg daily.  4. Crestor 20 mg daily.  5. Micardis 40 mg daily, the patient was on 80 mg prior to admission.  6. Coreg CR 40 mg daily.  7. Norvasc 5 mg daily.  8. Niacin 1000 mg daily.   PHYSICAL EXAMINATION:  GENERAL:  This is a pleasant, but anxious 57-year-  old white male in no acute distress.  VITAL SIGNS:  Blood pressure 156/90 when I retook it was 160/100, pulse  74, weight 235.  NECK:  Without JVD, HJR, bruit, or thyroid enlargement.  LUNGS:  Clear anterior, posterior, and lateral.  HEART:  Regular rate and rhythm at 70 beats per minute.  Normal S1 and  S2.  No murmur, rub, bruit, thrill, or heave noted.  ABDOMEN:  Soft  without organomegaly, masses, lesions or abnormal tenderness.  Right  groin without hematoma or hemorrhage.  He has good distal pulses.  LOWER EXTREMITIES:  Without cyanosis, clubbing, or edema.  He has  positive distal pulses.   EKG, normal sinus rhythm.  He has some inferolateral T-wave inversion.  No acute changes.   IMPRESSION:  1. Status post non-ST-segment elevation myocardial infarction on      August 23, 2008, treated with two drug-eluting stents to the right      coronary artery and drug-eluting stent to the mid left anterior      descending (coronary artery).  2. Normal left ventricular function.  3. Hypertension.  4. Diabetes mellitus type 2.  5. Dyslipidemia.   PLAN:  I will increase his Micardis back up to 80 mg a day as he was  taking prior to hospitalization.  I told him he could increase his  exercise.  He is able to return back to work at [employer] November 15.  He  will see Dr. Gala Romney back in 2-3 months.  I have not increased his  niacin because he says he has a little bit of ringing in his ears when  he takes this.      Jacolyn Reedy, PA-C  Electronically Signed      Marca Ancona, MD  Electronically Signed   ML/MedQ  DD: 09/16/2008  DT: 09/16/2008  Job #: [id number]   cc:   Dalbert Mayotte, M.D.

## 2011-04-04 NOTE — Consult Note (Signed)
NAME:  [name] [name], [name] [name] NO.:  [id number]   MEDICAL RECORD NO.:  [id number]          PATIENT TYPE:  INP   LOCATION:  3705                         FACILITY:  [facility]   PHYSICIAN:  Bevelyn Buckles. Bensimhon, MDDATE OF BIRTH:  [birth year]   DATE OF CONSULTATION:  08/23/2008  DATE OF DISCHARGE:                                 CONSULTATION   PRIMARY CARE PHYSICIAN:  Bevelyn Buckles. Bensimhon, MD, cardiologist.   SUMMARY OF HISTORY:  [name] [name] is a 60 year old white male who was  admitted through [facility] via teaching service  secondary to fatigue and left arm numbness.  [name] [name] states over  the last week he has felt lethargic.  He further describes this as  weakness, feeling drunk, lightheaded, and fatigue with decreased energy.  He said that his blood pressure checks at home were not really resulted  in any specific changes and he does never check his sugar.  However,  last night, he went to bed as usual around 11 o'clock and was awakened  around 1 a.m. with sweatiness.  He checked his blood pressure and it was  151/89.  He also noted some left arm tingling.  He went downstairs and  watched TV and the tingling reduced from a 4 intensity to a 2 intensity,  and when he returned back to bed about 2:30 his left arm tingling became  worse and he also noticed a feeling like a hot flash.  It was similar to  the beginning of a panic attack, but it was also different that he  really could not elaborate.  He also describes some radiation into his  left shoulder.  He could not describe any alleviating or aggravating  factors; however, he has noticed this before on occasion that it would  occur when he would sleep on his arm long, but today it was much worse  than usual and lasted much longer.  He came to the emergency room due to  the persistence, which was unusual, but he is not sure what relieved the  tingling.   ALLERGIES:  No known drug allergies.   MEDICATIONS:   Prior to admission include:  1. Coreg 40 mg daily.  2. Micardis 80 mg daily.  3. Glipizide 10 mg daily.  4. Crestor 10 mg daily.  5. Norvasc 5 mg daily.  6. Aspirin 81 daily.  7. NovoLog 70/30, 15 units in the morning and 35 units in the p.m.  8. Colchicine p.r.n.  9. Xanax p.r.n.   His medical history is notable for hypertension since the 90s at home.  It would be from the 120s-140s/70s-80s.  He has also had diabetes for  the last 6 years.  He does not check his sugars.  He denies any  associated apathies.  Last hemoglobin A1c was approximately 1 month ago  and was 6.6.  He admits to a history of gout, anxiety, hyperlipidemia  with unknown last check.   PAST SURGICAL HISTORY:  Hemorrhoids, inguinal hernia repair, nasal  surgery, bilateral knee surgery, and right elbow surgery.  He  specifically denies a history of myocardial infarction,  CVA, COPD,  bleeding dyscrasias, renal dysfunction, and thyroid disorder.   SOCIAL HISTORY:  He resides in [place] with his wife.  He works at  the [employer].  He quit smoking approximately 25 years ago.  He  denies any alcohol, drugs, exercise, or specific diet.   FAMILY HISTORY:  Mother died at the age of 59 with hypertension and  squamous cell cancer.  Father died at the age of 16 with his first  myocardial infarction, history of diabetes, and hypertension.  He has  two brothers one of which has hypertension and another has a irregular  heart beat.   REVIEW OF SYSTEMS:  In addition to the above is notable for occasional  sinus problems, glasses, slow heart beat, positive snoring but has never  been evaluated for sleep apnea, generalized weakness, numbness, and  anxiety.  All other systems are unremarkable.   PHYSICAL EXAMINATION:  GENERAL:  Well-nourished, well-developed,  pleasant white male in no apparent distress.  Wife is present.  VITAL SIGNS:  Temperature is 98.2, blood pressure 145/91, pulse 48,  respirations 18, and 97%  sat on 2 liters.  HEENT:  Unremarkable.  NECK:  Supple without thyromegaly, adenopathy, JVD, or carotid bruits.  CHEST:  Symmetrical excursion.  LUNGS:  Clear to auscultation.  HEART:  PMI is not displaced.  Regular rate and rhythm.  Did not  appreciate any murmurs, rubs, clicks, or gallops.  All pulses are  symmetrical and intact.  Did not appreciate any abdominal or femoral  bruits.  SKIN:  Integument is intact.  ABDOMEN:  Obese.  Bowel sounds present without organomegaly, masses, or  tenderness.  EXTREMITIES:  Negative for cyanosis, clubbing, or edema.  MUSCULOSKELETAL:  Unremarkable.  NEUROLOGIC:  Unremarkable.   Chest x-ray showed mild cardiomegaly with minimal left basilar scarring  and atelectasis.  EKG showed sinus bradycardia, normal axis, early R-  wave, and nonspecific ST-T wave changes.  Old EKG is not available for  comparison.  H&H was 15.9 and 47.1, normal indices, platelets 150, and  WBCs 8.3.  I-STAT in the ER showed a sodium of 144, potassium 3.8, BUN  15, creatinine 1.2, and glucose 169.  Point-of-care markers were  negative x1.  PTT 28, PT 13.8, CK-MB was 130 and 4.3, and a troponin of  1.6.  Urine drug screen was negative.   IMPRESSION:  1. Left arm tingling, possible anginal equivalent.  2. Hypertension.  3. Sinus bradycardia.  4. Hyperglycemia with a history of diabetes.  5. History as listed per past medical history.   DISPOSITION:  Dr. Gala Romney reviewed the patient's history, spoke with  and examined the patient, and agrees with the above.  His symptoms are  felt to be very typical for cardiac ischemia; however given the  presentation,  elevated markers, and risk factors, we will arrange a cardiac  catheterization to evaluate his coronary artery anatomy for August 24, 2008.  I agree with current medications and decreasing Coreg given his  sinus bradycardia.  We would also consider checking a TSH and evaluate  him as an outpatient for sleep  apnea.      Joellyn Rued, PA-C      Bevelyn Buckles. Bensimhon, MD  Electronically Signed    EW/MEDQ  D:  08/23/2008  T:  08/24/2008  Job:  [id number]   cc:   Dalbert Mayotte, M.D.

## 2011-04-04 NOTE — Discharge Summary (Signed)
NAME:  [name] [name], [name] [name] NO.:  [id number]   MEDICAL RECORD NO.:  [id number]          PATIENT TYPE:  INP   LOCATION:  6525                         FACILITY:  [facility]   PHYSICIAN:  Chauncey Reading, D.O.  DATE OF BIRTH:  [birth year]   DATE OF ADMISSION:  08/23/2008  DATE OF DISCHARGE:  08/27/2008                               DISCHARGE SUMMARY   CONSULTANTS:  [facility], Dr. Gala Romney and Dr. Juanda Chance.   DISCHARGE DIAGNOSES:  1. Non ST segment elevation myocardial infarction.  2. Hypertension.  3. Renal insufficiency, resolved, likely due to contrast exposure      during cath.  4. Diabetes type 2.  5. Dyslipidemia.  6. Bradycardia, likely secondary to #1.  7. Mild thrombocytopenia, unclear etiology.   DISCHARGE MEDICATIONS:  1. Aspirin 81 mg daily.  2. Plavix 75 mg daily.  3. Glipizide 5 mg daily.  4. Crestor 20 mg daily.  5. Niacin 250 mg b.i.d., this will be increased to 2-3 g as an      outpatient.  6. Micardis 40 mg daily.   DISPOSITION:  The patient will follow up with [facility] for his  stent.   PROCEDURES PERFORMED:  1. The patient had a 2-D echo that showed a left ventricular systolic      function within normal limits with an ejection fraction of 55%.      There was no diagnostic evidence of left ventricular wall motion      abnormality.  Cardiac catheterization showed a totally occluded      right coronary artery and a partially occluded left anterior      descending.  2. The right coronary was stented with two drug-eluting stents and the      left anterior descending was stented in the midportion without a      drug-eluting stent.   BRIEF ADMITTING H AND P:  This is a 60 year old gentleman with past  medical history of hypertension, diabetes, hyperlipidemia, presents with  chest pain of 1-week duration, feeling tired and lightheaded at this  time.  This would improve during the night.  One day prior to admission,  he was washing  his truck and feeling dizzy, felt tired, and little bit  short of breath.  He does sat down and his chest pain improved.  The  next day, he was working at his yard and started getting left arm pain  with some diaphoresis.  He took his blood pressure at home and his blood  pressure was 151/89.  He complained of some nausea and some vomiting,  but no diarrhea accompanying this with shortness of breath.  It is to  note that he has no baseline short of breath and the shortness of breath  started after he was exerting himself.   PHYSICAL EXAMINATION:  VITAL SIGNS:  Temperature 97.1, blood pressure  157/87, pulse of 57, respirations 16, and he was sating 99% on room air.  GENERAL APPEARANCE:  He is alert, lying down in bed comfortably in no  acute distress.  RESPIRATION:  Good air movement and clear to auscultation.  CARDIOVASCULAR:  Regular rate  and rhythm, borderlines level.  No  murmurs, rubs, or gallops.  ABDOMEN:  He had positive bowel sounds, nondistended, and nontender.  Extremities:  Warm positive pulses with no edema.  NEUROLOGIC:  Alert and oriented x3.  Cranial nerves III to XII grossly  intact.  Sensation intact to light touch.   LABS ON THE DAY OF ADMISSION:  His UDS was negative.  Sodium 144,  potassium 3.8, chloride of 108, bicarb of 29, BUN of 15, creatinine 1.2.  Glucose of 169, INR is currently 1.19.  His point of cardiac care  markers, his first one was 0.16 and his CK-MB was 4.3.  A chest x-ray  showed little bit of cardiomegaly with some right basilar scarring.  An  EKG showed sinus brady with normal axis and nonspecific T-wave changes.  This was thought to be an NSTEMI due to his typical presentation,  Cardiology was consulted.  The patient had a bump in his troponins.   HOSPITAL COURSE:  1. Non STEMI.  His second set of cardiac enzymes showed a troponin of      0.16 and Cardiology was consulted.  The patient was taken to the      cath lab his RCA which was totally  occluded was opened up and 2      drug-eluting stent overlapping were placed.  He was continued on      his aspirin and he was started on Plavix on admission.  During his      hospital stay, he was not complaining of any more chest pain or      shortness of breath and he is to follow up with [place] as an outpatient      too.  He was not started on a beta-blocker due to his bradycardia.      This will be follow up with his cardiologist in the outpatient      clinic to see if a beta-blocker is needed.  2. Hypertension.  On admission, his blood pressure medication was      stopped because his blood pressure was running low.  His Coreg was      held due to his bradycardia.  The only medication for his      hypertension which was started was Benicar.  3. Increasing creatinine, clearly thought to be secondarily to      contrast.  So, the ACE was held during the first day.  He was given      fluids and his creatinine came back down.  4. Diabetes.  He was on sliding scale in the hospital, required no      additional insulin.  His hemoglobin A1c was 6.7.  He was only sent      on glipizide 5 mg daily.  This will be follow up as an outpatient      by his PCP.  5. Dyslipidemia.  The patient had an HDL of less than 40.  This was      significant and he was started on niacin, this would be increased      to 2-3 g as an outpatient.  On the day of discharge, his      temperature was 98.8, blood pressure 105/52, pulse of 52,      respiration of 20, he was sating on 93% on room air.  His      hemoglobin is 13.7, white count 6.5, and platelet 112.  Sodium 138,      potassium 3.3, chloride 105, bicarb  was 27, BUN of 10, creatinine      of 1.0.  Glucose of 149 and calcium of 8.8.  6. Thrombocytopenia.  This patient came in with platelets of 124 from      160.  He has no history of alcohol abuse and this was thought to be      secondarily to stress.  It could also be from his Plavix, so this      will be follow  up as an outpatient by his PCP.      Marinda Elk, M.D.  Electronically Signed      Chauncey Reading, D.O.  Electronically Signed    AF/MEDQ  D:  09/09/2008  T:  09/10/2008  Job:  [id number]   cc:   Dalbert Mayotte, M.D.

## 2011-08-22 LAB — LIPID PANEL
HDL: 27 — ABNORMAL LOW
LDL Cholesterol: 56
Total CHOL/HDL Ratio: 3.7
VLDL: 17

## 2011-08-22 LAB — COMPREHENSIVE METABOLIC PANEL
AST: 25
Albumin: 4.1
BUN: 12
Calcium: 9.1
Chloride: 104
Creatinine, Ser: 0.98
GFR calc Af Amer: >60
Total Protein: 6.3

## 2011-08-22 LAB — BASIC METABOLIC PANEL
BUN: 10
BUN: 12
CO2: 24
CO2: 25
CO2: 26
Calcium: 8.4
Chloride: 108
Chloride: 109
Creatinine, Ser: 1.03
Creatinine, Ser: 1.08
Creatinine, Ser: 1.23
Creatinine, Ser: 1.46
GFR calc Af Amer: >60
GFR calc Af Amer: >60
GFR calc non Af Amer: >60
GFR calc non Af Amer: >60
Glucose, Bld: 149 — ABNORMAL HIGH
Potassium: 3.3 — ABNORMAL LOW
Potassium: 3.5
Potassium: 3.7
Sodium: 141
Sodium: 141

## 2011-08-22 LAB — CK TOTAL AND CKMB (NOT AT ARMC)
CK, MB: 4.3 — ABNORMAL HIGH
Relative Index: 3.3 — ABNORMAL HIGH
Total CK: 130

## 2011-08-22 LAB — RAPID URINE DRUG SCREEN, HOSP PERFORMED
Amphetamines: NOT DETECTED
Cocaine: NOT DETECTED
Tetrahydrocannabinol: NOT DETECTED

## 2011-08-22 LAB — TSH: TSH: 2.674

## 2011-08-22 LAB — GLUCOSE, CAPILLARY
Glucose-Capillary: 132 — ABNORMAL HIGH
Glucose-Capillary: 136 — ABNORMAL HIGH
Glucose-Capillary: 138 — ABNORMAL HIGH
Glucose-Capillary: 142 — ABNORMAL HIGH
Glucose-Capillary: 142 — ABNORMAL HIGH
Glucose-Capillary: 146 — ABNORMAL HIGH
Glucose-Capillary: 150 — ABNORMAL HIGH
Glucose-Capillary: 151 — ABNORMAL HIGH
Glucose-Capillary: 161 — ABNORMAL HIGH
Glucose-Capillary: 161 — ABNORMAL HIGH
Glucose-Capillary: 179 — ABNORMAL HIGH
Glucose-Capillary: 191 — ABNORMAL HIGH
Glucose-Capillary: 195 — ABNORMAL HIGH
Glucose-Capillary: 207 — ABNORMAL HIGH
Glucose-Capillary: 149 — ABNORMAL HIGH

## 2011-08-22 LAB — CBC
HCT: 39.9
HCT: 42.5
HCT: 47.1
Hemoglobin: 12.8 — ABNORMAL LOW
MCHC: 33.8
MCHC: 34
MCV: 92.9
MCV: 94.8
MCV: 94.8
Platelets: 112 — ABNORMAL LOW
Platelets: 115 — ABNORMAL LOW
Platelets: 124 — ABNORMAL LOW
Platelets: 150
RBC: 3.98 — ABNORMAL LOW
RBC: 4 — ABNORMAL LOW
RDW: 12.6
RDW: 13.1
WBC: 6.5
WBC: 7.8
WBC: 8.3
WBC: 9.5

## 2011-08-22 LAB — POCT I-STAT, CHEM 8
Calcium, Ion: 1.19
Chloride: 108
HCT: 47
Hemoglobin: 16
Potassium: 3.8

## 2011-08-22 LAB — HEPARIN LEVEL (UNFRACTIONATED)
Heparin Unfractionated: 0.42
Heparin Unfractionated: 0.71 — ABNORMAL HIGH

## 2011-08-22 LAB — DIFFERENTIAL
Basophils Absolute: 0
Eosinophils Relative: 1
Lymphocytes Relative: 16
Lymphs Abs: 1.3
Monocytes Absolute: 0.4
Monocytes Relative: 4
Neutro Abs: 6.5

## 2011-08-22 LAB — APTT: aPTT: 28

## 2011-08-22 LAB — POCT CARDIAC MARKERS: Myoglobin, poc: 79.1

## 2011-08-22 LAB — PROTIME-INR
INR: 1
Prothrombin Time: 13.8

## 2011-08-22 LAB — CARDIAC PANEL(CRET KIN+CKTOT+MB+TROPI): Troponin I: 3.86

## 2011-10-23 ENCOUNTER — Other Ambulatory Visit: Payer: Self-pay | Admitting: Internal Medicine

## 2012-05-16 ENCOUNTER — Other Ambulatory Visit: Payer: Self-pay | Admitting: Internal Medicine

## 2012-05-16 MED ORDER — CLOPIDOGREL BISULFATE 75 MG PO TABS: 75.0000 mg | ORAL_TABLET | Freq: Every day | ORAL | Status: DC

## 2012-07-15 ENCOUNTER — Other Ambulatory Visit: Payer: Self-pay | Admitting: *Deleted

## 2012-07-15 MED ORDER — CLOPIDOGREL BISULFATE 75 MG PO TABS: 75.0000 mg | ORAL_TABLET | Freq: Every day | ORAL | Status: DC

## 2014-08-05 ENCOUNTER — Ambulatory Visit (HOSPITAL_COMMUNITY)
Admission: RE | Admit: 2014-08-05 | Discharge: 2014-08-05 | Disposition: A | Discharge status: Home / Self Care | Payer: BC Managed Care – PPO | Source: Ambulatory Visit | Attending: Internal Medicine | Admitting: Internal Medicine

## 2014-08-05 VITALS — BP 176/84 | HR 84 | Wt 234.0 lb

## 2014-08-05 DIAGNOSIS — Z7901 Long term (current) use of anticoagulants: Secondary | ICD-10-CM | POA: Diagnosis not present

## 2014-08-05 DIAGNOSIS — I2589 Other forms of chronic ischemic heart disease: Secondary | ICD-10-CM | POA: Diagnosis not present

## 2014-08-05 DIAGNOSIS — Z794 Long term (current) use of insulin: Secondary | ICD-10-CM | POA: Diagnosis not present

## 2014-08-05 DIAGNOSIS — E119 Type 2 diabetes mellitus without complications: Secondary | ICD-10-CM | POA: Insufficient documentation

## 2014-08-05 DIAGNOSIS — E785 Hyperlipidemia, unspecified: Secondary | ICD-10-CM | POA: Insufficient documentation

## 2014-08-05 DIAGNOSIS — Z7982 Long term (current) use of aspirin: Secondary | ICD-10-CM | POA: Insufficient documentation

## 2014-08-05 DIAGNOSIS — I10 Essential (primary) hypertension: Secondary | ICD-10-CM | POA: Insufficient documentation

## 2014-08-05 DIAGNOSIS — I2510 Atherosclerotic heart disease of native coronary artery without angina pectoris: Secondary | ICD-10-CM | POA: Insufficient documentation

## 2014-08-05 DIAGNOSIS — R0989 Other specified symptoms and signs involving the circulatory and respiratory systems: Secondary | ICD-10-CM | POA: Diagnosis not present

## 2014-08-05 DIAGNOSIS — I252 Old myocardial infarction: Secondary | ICD-10-CM | POA: Diagnosis not present

## 2014-08-05 HISTORY — DX: Other specified symptoms and signs involving the circulatory and respiratory systems: R09.89

## 2014-08-05 NOTE — Progress Notes (Signed)
Patient ID: CLANTON EMANUELSON, male   DOB: 01/12/1951, 63 y.o.   MRN: [id number] HPI:  Mr. Denham is a very pleasant 63 year old male with history of coronary artery disease status post non-ST-elevation myocardial infarction, October 2008, treated with two drug-eluting stents to the RCA and a stent to the mid LAD.  EF was 55%. Myoview 2012 EF 44% Also, has a history of hypertension (with white-coat component), hyperlipidemia, and diabetes.  He was admitted in March 2012 with dizziness and staggering after mowing the grass. Also had some tightness. HR on admit was in low 40s and we cut Coreg back. Ruled out for MI with serial cardiac markers. Had f/u ETT/Myoview on 02/20/11, Walked 8:01. Stopped due to dyspnea. No CP.  BP 164/93 at baseline.went up to 220/84 in stage I. ECG normal. Images with mild reversible defect at the base of the infero-septal wall. EF 44%  Returns today to establish care since 2012. SBP in home 258N/27P (lowest systolic at home 824M); HR usually running 60-65. Weights in the last few weeks 220s-230s. Feels good when he is at 220. No further episodes of chest pain or tightness. Has used lasix about 3 times in the last month. Takes when notes peripheral swelling in ankles. Is staying away from carbonated beverages, watches fluid and salt intake. Exercises at least every other day, working towards becoming more active.   Labs 03/15/11- Cr 1.1; K 4.0  ROS: All systems negative except as listed in HPI, PMH and Problem List.  Past Medical History  Diagnosis Date  . CAD (coronary artery disease)     nonST elevated MI in Oct 2008 treated w/2 drug -eluting stents to the RCA and  mid LAD, EF 55%  . HTN (hypertension)   . Hyperlipidemia   . DM (diabetes mellitus)     Current Outpatient Prescriptions  Medication Sig Dispense Refill  . amLODipine (NORVASC) 10 MG tablet Take 10 mg by mouth daily.        Marland Kitchen aspirin 81 MG tablet Take 81 mg by mouth daily.        . clopidogrel (PLAVIX)  75 MG tablet Take 1 tablet (75 mg total) by mouth daily.  15 tablet  0  . cyanocobalamin 1000 MCG tablet Take 1,000 mcg by mouth every 14 (fourteen) days.      . furosemide (LASIX) 20 MG tablet Take 20 mg by mouth as needed.      Marland Kitchen glipiZIDE (GLUCOTROL) 10 MG tablet Take 10 mg by mouth daily.        . insulin glargine (LANTUS) 100 UNIT/ML injection Inject 13 Units into the skin as directed.       . metoprolol succinate (TOPROL-XL) 25 MG 24 hr tablet Take 12.5 mg by mouth daily.      . rosuvastatin (CRESTOR) 20 MG tablet Take 20 mg by mouth daily.        Marland Kitchen telmisartan (MICARDIS) 80 MG tablet Take 80 mg by mouth daily.         No current facility-administered medications for this encounter.     PHYSICAL EXAM: Filed Vitals:   08/05/14 1350  BP: 176/84  Pulse: 84   General:  Well appearing. No resp difficulty HEENT: normal Neck: supple. JVP flat. Carotids 2+ bilaterally; right carotid bruit, not present on the left. No lymphadenopathy or thryomegaly appreciated. Cor: PMI normal. Regular rate & rhythm. Soft TR murmur No rubs, gallops Lungs: clear Abdomen: soft, nontender, nondistended. No hepatosplenomegaly. No bruits or masses. Good bowel sounds.  Extremities: no cyanosis, clubbing, rash, trace edema bilaterally  Neuro: alert & orientedx3, cranial nerves grossly intact. Moves all 4 extremities w/o difficulty. Affect pleasant.   ECG: NSR, nml axis, Qt nml, small inferior Qs  twave flattening in ii, iii, avf (not new from previous), no LVH   ASSESSMENT & PLAN: 1. CAD s/p NSTEMI s/p 2 DES placement- currently doing well on ASA and plavix -is prone to more bleeding events, but no falls at home or trauma injuries of late   -goal LDL <70, would f/up with PCP, presently on crestor 20mg   -continue exercise goal 30 mins most days a week; diet control -cannot up titrate metoprolol 2/2 episodes of bradycardia in the past   2. HTN- has known hx of white coat HTN- at home SBPs at goal -continue  home BP monitoring  - If BP trends back up would add back spiro 12.5 for BP and trace edema.   3. Ischemic CM - EF 44% per myoview in 2012 -doing much better on toprolol vs coreg -on ARB, statin, previously on spiro not currently  4. Carotid bruit (right sided) -will obtain carotid dupplex   Benay Spice 5:21 PM

## 2014-08-05 NOTE — Patient Instructions (Signed)
Your physician has requested that you have a carotid duplex. This test is an ultrasound of the carotid arteries in your neck. It looks at blood flow through these arteries that supply the brain with blood. Allow one hour for this exam. There are no restrictions or special instructions.  We will contact you in 1 year to schedule your next appointment.  

## 2014-08-06 NOTE — Addendum Note (Signed)
Encounter addended by: Asencion Gowda, CCT on: 08/06/2014  8:50 AM<BR>     Documentation filed: Charges VN

## 2014-10-01 ENCOUNTER — Ambulatory Visit (HOSPITAL_COMMUNITY): Payer: BC Managed Care – PPO | Attending: Cardiology | Admitting: *Deleted

## 2014-10-01 DIAGNOSIS — I10 Essential (primary) hypertension: Secondary | ICD-10-CM | POA: Diagnosis not present

## 2014-10-01 DIAGNOSIS — R0989 Other specified symptoms and signs involving the circulatory and respiratory systems: Secondary | ICD-10-CM | POA: Insufficient documentation

## 2014-10-01 DIAGNOSIS — I2510 Atherosclerotic heart disease of native coronary artery without angina pectoris: Secondary | ICD-10-CM | POA: Diagnosis not present

## 2014-10-01 DIAGNOSIS — E119 Type 2 diabetes mellitus without complications: Secondary | ICD-10-CM | POA: Insufficient documentation

## 2014-10-01 DIAGNOSIS — E785 Hyperlipidemia, unspecified: Secondary | ICD-10-CM | POA: Insufficient documentation

## 2014-10-01 NOTE — Progress Notes (Signed)
Carotid duplex complete 

## 2016-08-17 ENCOUNTER — Emergency Department (HOSPITAL_BASED_OUTPATIENT_CLINIC_OR_DEPARTMENT_OTHER): Payer: BLUE CROSS/BLUE SHIELD

## 2016-08-17 ENCOUNTER — Encounter (HOSPITAL_BASED_OUTPATIENT_CLINIC_OR_DEPARTMENT_OTHER): Payer: Self-pay

## 2016-08-17 ENCOUNTER — Inpatient Hospital Stay (HOSPITAL_BASED_OUTPATIENT_CLINIC_OR_DEPARTMENT_OTHER)
Admission: EM | Admit: 2016-08-17 | Discharge: 2016-08-20 | DRG: 194 | Disposition: A | Discharge status: Home / Self Care | Payer: BLUE CROSS/BLUE SHIELD | Attending: Internal Medicine | Admitting: Internal Medicine

## 2016-08-17 DIAGNOSIS — Z881 Allergy status to other antibiotic agents status: Secondary | ICD-10-CM

## 2016-08-17 DIAGNOSIS — I2510 Atherosclerotic heart disease of native coronary artery without angina pectoris: Secondary | ICD-10-CM | POA: Diagnosis present

## 2016-08-17 DIAGNOSIS — Z880 Allergy status to penicillin: Secondary | ICD-10-CM

## 2016-08-17 DIAGNOSIS — Z23 Encounter for immunization: Secondary | ICD-10-CM | POA: Diagnosis not present

## 2016-08-17 DIAGNOSIS — I10 Essential (primary) hypertension: Secondary | ICD-10-CM | POA: Diagnosis present

## 2016-08-17 DIAGNOSIS — E119 Type 2 diabetes mellitus without complications: Secondary | ICD-10-CM

## 2016-08-17 DIAGNOSIS — D696 Thrombocytopenia, unspecified: Secondary | ICD-10-CM | POA: Diagnosis present

## 2016-08-17 DIAGNOSIS — N179 Acute kidney failure, unspecified: Secondary | ICD-10-CM | POA: Diagnosis present

## 2016-08-17 DIAGNOSIS — J181 Lobar pneumonia, unspecified organism: Secondary | ICD-10-CM

## 2016-08-17 DIAGNOSIS — J189 Pneumonia, unspecified organism: Principal | ICD-10-CM | POA: Diagnosis present

## 2016-08-17 DIAGNOSIS — Z87891 Personal history of nicotine dependence: Secondary | ICD-10-CM

## 2016-08-17 DIAGNOSIS — I252 Old myocardial infarction: Secondary | ICD-10-CM | POA: Diagnosis not present

## 2016-08-17 DIAGNOSIS — Z833 Family history of diabetes mellitus: Secondary | ICD-10-CM

## 2016-08-17 DIAGNOSIS — E785 Hyperlipidemia, unspecified: Secondary | ICD-10-CM | POA: Diagnosis present

## 2016-08-17 DIAGNOSIS — Z888 Allergy status to other drugs, medicaments and biological substances status: Secondary | ICD-10-CM | POA: Diagnosis not present

## 2016-08-17 DIAGNOSIS — Z955 Presence of coronary angioplasty implant and graft: Secondary | ICD-10-CM

## 2016-08-17 DIAGNOSIS — Z8249 Family history of ischemic heart disease and other diseases of the circulatory system: Secondary | ICD-10-CM | POA: Diagnosis not present

## 2016-08-17 DIAGNOSIS — D72829 Elevated white blood cell count, unspecified: Secondary | ICD-10-CM | POA: Diagnosis present

## 2016-08-17 DIAGNOSIS — Z7902 Long term (current) use of antithrombotics/antiplatelets: Secondary | ICD-10-CM | POA: Diagnosis not present

## 2016-08-17 DIAGNOSIS — Z7982 Long term (current) use of aspirin: Secondary | ICD-10-CM

## 2016-08-17 DIAGNOSIS — R05 Cough: Secondary | ICD-10-CM | POA: Diagnosis present

## 2016-08-17 DIAGNOSIS — E118 Type 2 diabetes mellitus with unspecified complications: Secondary | ICD-10-CM

## 2016-08-17 DIAGNOSIS — Z79899 Other long term (current) drug therapy: Secondary | ICD-10-CM

## 2016-08-17 DIAGNOSIS — Z794 Long term (current) use of insulin: Secondary | ICD-10-CM | POA: Diagnosis not present

## 2016-08-17 DIAGNOSIS — E1165 Type 2 diabetes mellitus with hyperglycemia: Secondary | ICD-10-CM | POA: Diagnosis present

## 2016-08-17 DIAGNOSIS — T380X5A Adverse effect of glucocorticoids and synthetic analogues, initial encounter: Secondary | ICD-10-CM | POA: Diagnosis present

## 2016-08-17 HISTORY — DX: Pneumonia, unspecified organism: J18.9

## 2016-08-17 LAB — COMPREHENSIVE METABOLIC PANEL
ALBUMIN: 3.8 g/dL (ref 3.5–5.0)
ALT: 28 U/L (ref 17–63)
ANION GAP: 9 (ref 5–15)
AST: 37 U/L (ref 15–41)
Alkaline Phosphatase: 58 U/L (ref 38–126)
BILIRUBIN TOTAL: 1.9 mg/dL — AB (ref 0.3–1.2)
BUN: 18 mg/dL (ref 6–20)
CO2: 26 mmol/L (ref 22–32)
Calcium: 8.9 mg/dL (ref 8.9–10.3)
Chloride: 101 mmol/L (ref 101–111)
Creatinine, Ser: 1.35 mg/dL — ABNORMAL HIGH (ref 0.61–1.24)
GFR calc Af Amer: >60 mL/min (ref >60)
GFR calc non Af Amer: 54 mL/min — ABNORMAL LOW (ref >60)
GLUCOSE: 214 mg/dL — AB (ref 65–99)
POTASSIUM: 3.7 mmol/L (ref 3.5–5.1)
SODIUM: 136 mmol/L (ref 135–145)
TOTAL PROTEIN: 7.3 g/dL (ref 6.5–8.1)

## 2016-08-17 LAB — CBC WITH DIFFERENTIAL/PLATELET
BASOS PCT: 0 %
Basophils Absolute: 0 10*3/uL (ref 0.0–0.1)
EOS ABS: 0 10*3/uL (ref 0.0–0.7)
EOS PCT: 0 %
HEMATOCRIT: 47.7 % (ref 39.0–52.0)
Hemoglobin: 16.5 g/dL (ref 13.0–17.0)
Lymphocytes Relative: 8 %
Lymphs Abs: 0.7 10*3/uL (ref 0.7–4.0)
MCH: 30.8 pg (ref 26.0–34.0)
MCHC: 34.6 g/dL (ref 30.0–36.0)
MCV: 89.2 fL (ref 78.0–100.0)
MONO ABS: 1 10*3/uL (ref 0.1–1.0)
MONOS PCT: 11 %
Neutro Abs: 7.4 10*3/uL (ref 1.7–7.7)
Neutrophils Relative %: 81 %
PLATELETS: 109 10*3/uL — AB (ref 150–400)
RBC: 5.35 MIL/uL (ref 4.22–5.81)
RDW: 12.5 % (ref 11.5–15.5)
WBC: 9.2 10*3/uL (ref 4.0–10.5)

## 2016-08-17 LAB — I-STAT CG4 LACTIC ACID, ED: LACTIC ACID, VENOUS: 0.91 mmol/L (ref 0.5–1.9)

## 2016-08-17 MED ORDER — CEFTRIAXONE SODIUM 1 G IJ SOLR: 1.0000 g | Freq: Once | INTRAMUSCULAR | Status: DC

## 2016-08-17 MED ORDER — IPRATROPIUM-ALBUTEROL 0.5-2.5 (3) MG/3ML IN SOLN: 3.0000 mL | RESPIRATORY_TRACT | Status: DC

## 2016-08-17 MED ORDER — DEXTROSE 5 % IV SOLN
1.0000 g | Freq: Once | INTRAVENOUS | Status: AC
  Administered 2016-08-17: 1 g via INTRAVENOUS
  Filled 2016-08-17: qty 10

## 2016-08-17 MED ORDER — LEVALBUTEROL HCL 1.25 MG/0.5ML IN NEBU
1.2500 mg | INHALATION_SOLUTION | Freq: Once | RESPIRATORY_TRACT | Status: AC
  Administered 2016-08-17: 1.25 mg via RESPIRATORY_TRACT
  Filled 2016-08-17: qty 0.5

## 2016-08-17 MED ORDER — ACETAMINOPHEN 325 MG PO TABS
650.0000 mg | ORAL_TABLET | Freq: Once | ORAL | Status: AC
  Administered 2016-08-17: 650 mg via ORAL
  Filled 2016-08-17: qty 2

## 2016-08-17 MED ORDER — METHYLPREDNISOLONE SODIUM SUCC 125 MG IJ SOLR
125.0000 mg | Freq: Once | INTRAMUSCULAR | Status: AC
  Administered 2016-08-17: 125 mg via INTRAVENOUS
  Filled 2016-08-17: qty 2

## 2016-08-17 MED ORDER — IPRATROPIUM BROMIDE 0.02 % IN SOLN
0.5000 mg | Freq: Once | RESPIRATORY_TRACT | Status: AC
  Administered 2016-08-17: 0.5 mg via RESPIRATORY_TRACT
  Filled 2016-08-17: qty 2.5

## 2016-08-17 MED ORDER — DEXTROSE 5 % IV SOLN
500.0000 mg | Freq: Once | INTRAVENOUS | Status: AC
  Administered 2016-08-17: 500 mg via INTRAVENOUS

## 2016-08-17 MED ORDER — AZITHROMYCIN 500 MG IV SOLR
INTRAVENOUS | Status: AC
  Filled 2016-08-17: qty 500

## 2016-08-17 NOTE — ED Notes (Signed)
Patient transported to X-ray 

## 2016-08-17 NOTE — ED Triage Notes (Addendum)
Prod cough x 1 week-was seen by PCP 2 days ago-started on abx and inhaler-DOE noted when walked from ED WR to triage

## 2016-08-17 NOTE — Progress Notes (Signed)
[place],  ED requesting admission for LLL pneumonia, will accept to [facility]

## 2016-08-17 NOTE — ED Provider Notes (Signed)
[facility] DEPT [facility] Provider Note   CSN: [id number] Arrival date & time: 08/17/16  2039   By signing my name below, I, Delton Prairie, attest that this documentation has been prepared under the direction and in the presence of Deno Etienne, DO  Electronically Signed: Delton Prairie, ED Scribe. 08/17/16. 9:32 PM.   History   Chief Complaint Chief Complaint  Patient presents with  . Cough    The history is provided by the patient. No language interpreter was used.   HPI Comments:  [name] [name] is a 65 y.o. male, with a hx of CAD, MI's and HTN, who presents to the Emergency Department complaining of productive cough x 1 week. Pt was seen by PCP 2 days ago and was prescribed medication. He is not able to properly catch his breath. He denies any other complaints at this time.   Past Medical History:  Diagnosis Date  . 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 DM (diabetes mellitus) ([place])   . HTN (hypertension)   . Hyperlipidemia     Patient Active Problem List   Diagnosis Date Noted  . Right carotid bruit 08/05/2014  . HYPERLIPIDEMIA-MIXED 06/15/2009  . HYPERTENSION, BENIGN 06/15/2009  . CAD, NATIVE VESSEL 06/15/2009    Past Surgical History:  Procedure Laterality Date  . CORONARY ANGIOPLASTY WITH STENT PLACEMENT    . LAPAROSCOPIC INGUINAL HERNIA REPAIR         Home Medications    Prior to Admission medications   Medication Sig Start Date End Date Taking? Authorizing Provider  cefdinir (OMNICEF) 300 MG capsule Take 300 mg by mouth 2 (two) times daily.   Yes Historical Provider, MD  levalbuterol [facility] HFA) 45 MCG/ACT inhaler Inhale into the lungs every 4 (four) hours as needed for wheezing.   Yes Historical Provider, MD  amLODipine (NORVASC) 10 MG tablet Take 10 mg by mouth daily.      Historical Provider, MD  aspirin 81 MG tablet Take 81 mg by mouth daily.      Historical  Provider, MD  clopidogrel (PLAVIX) 75 MG tablet Take 1 tablet (75 mg total) by mouth daily. 07/15/12   Jolaine Artist, MD  cyanocobalamin 1000 MCG tablet Take 1,000 mcg by mouth every 14 (fourteen) days.    Historical Provider, MD  furosemide (LASIX) 20 MG tablet Take 20 mg by mouth as needed.    Historical Provider, MD  glipiZIDE (GLUCOTROL) 10 MG tablet Take 10 mg by mouth daily.      Historical Provider, MD  insulin glargine (LANTUS) 100 UNIT/ML injection Inject 13 Units into the skin as directed.     Historical Provider, MD  metoprolol succinate (TOPROL-XL) 25 MG 24 hr tablet Take 12.5 mg by mouth daily.    Historical Provider, MD  rosuvastatin (CRESTOR) 20 MG tablet Take 20 mg by mouth daily.      Historical Provider, MD  telmisartan (MICARDIS) 80 MG tablet Take 80 mg by mouth daily.      Historical Provider, MD    Family History Family History  Problem Relation Age of Onset  . Hypertension Mother   . Cancer Mother   . Heart attack Father   . Hypertension Father   . Diabetes Father   . Hypertension Brother   . Irregular heart beat Brother     Social History Social History  Substance Use Topics  .  Smoking status: Former Research scientist (life sciences)  . Smokeless tobacco: Never Used  . Alcohol use No     Allergies   Fish oil; Penicillins; Ramipril; and Amoxicillin   Review of Systems Review of Systems  Constitutional: Positive for chills and fever.  HENT: Negative for congestion and facial swelling.   Eyes: Negative for discharge and visual disturbance.  Respiratory: Positive for cough and shortness of breath.   Cardiovascular: Negative for chest pain and palpitations.  Gastrointestinal: Negative for abdominal pain, diarrhea and vomiting.  Musculoskeletal: Negative for arthralgias and myalgias.  Skin: Negative for color change and rash.  Neurological: Positive for weakness. Negative for tremors, syncope and headaches.  Psychiatric/Behavioral: Negative for confusion and dysphoric mood.       Physical Exam Updated Vital Signs BP 118/77 (BP Location: Right Arm)   Pulse 108   Temp 102.1 F (38.9 C) (Oral) Comment: RN Santiago Glad informed of Pts temp  Resp 24   SpO2 92%   Physical Exam  Constitutional: He is oriented to person, place, and time. He appears well-developed and well-nourished.  HENT:  Head: Normocephalic and atraumatic.  Eyes: EOM are normal. Pupils are equal, round, and reactive to light.  Neck: Normal range of motion. Neck supple. No JVD present.  Cardiovascular: Normal rate and regular rhythm.  Exam reveals no gallop and no friction rub.   No murmur heard. Pulmonary/Chest: No respiratory distress. He has no wheezes.  Dimised lung sounds in all fields with prolonged expiration.  Abdominal: He exhibits no distension. There is no rebound and no guarding.  Musculoskeletal: Normal range of motion. He exhibits no edema.  No lower extremity edema.  Neurological: He is alert and oriented to person, place, and time.  Skin: No rash noted. No pallor.  Psychiatric: He has a normal mood and affect. His behavior is normal.  Nursing note and vitals reviewed.    ED Treatments / Results  DIAGNOSTIC STUDIES:  Oxygen Saturation is 91% on RA, adequate by my interpretation.    COORDINATION OF CARE:  9:16 PM Discussed treatment plan with pt at bedside and pt agreed to plan.  Labs (all labs ordered are listed, but only abnormal results are displayed) Labs Reviewed  CBC WITH DIFFERENTIAL/PLATELET - Abnormal; Notable for the following:       Result Value   Platelets 109 (*)    All other components within normal limits  COMPREHENSIVE METABOLIC PANEL - Abnormal; Notable for the following:    Glucose, Bld 214 (*)    Creatinine, Ser 1.35 (*)    Total Bilirubin 1.9 (*)    GFR calc non Af Amer 54 (*)    All other components within normal limits  CULTURE, BLOOD (ROUTINE X 2)  CULTURE, BLOOD (ROUTINE X 2)  I-STAT CG4 LACTIC ACID, ED    EKG  EKG  Interpretation  Date/Time:  Thursday August 17 2016 21:03:55 EDT Ventricular Rate:  105 PR Interval:  218 QRS Duration: 96 QT Interval:  344 QTC Calculation: 454 R Axis:   27 Text Interpretation:  Sinus tachycardia with 1st degree A-V block Nonspecific T wave abnormality Abnormal ECG No significant change since last tracing Confirmed by Ulas Zuercher MD, DANIEL [phone]) on 08/17/2016 9:12:06 PM       Radiology Dg Chest 2 View  Result Date: 08/17/2016 CLINICAL DATA:  65 year old with 4 day history of cough and chest congestion. Former smoker. Current history of coronary artery disease post coronary stenting. EXAM: CHEST  2 VIEW COMPARISON:  02/09/2011, 10/20/2009, 08/23/2008. FINDINGS: Suboptimal inspiration.  Taking this into account, cardiac silhouette upper normal in size, unchanged. Hilar and mediastinal contours otherwise unremarkable. Airspace consolidation involving the left lower lobe. Lungs otherwise clear. Pulmonary vascularity normal. No pleural effusions. Visualized bony thorax intact. IMPRESSION: Suboptimal inspiration.  Acute left lower lobe pneumonia. Electronically Signed   By: Evangeline Dakin M.D.   On: 08/17/2016 22:17    Procedures Procedures (including critical care time)  Medications Ordered in ED Medications  azithromycin (ZITHROMAX) 500 mg in dextrose 5 % 250 mL IVPB (not administered)  cefTRIAXone (ROCEPHIN) 1 g in dextrose 5 % 50 mL IVPB (1 g Intravenous New Bag/Given 08/17/16 2252)  acetaminophen (TYLENOL) tablet 650 mg (650 mg Oral Given 08/17/16 2053)  levalbuterol (XOPENEX) nebulizer solution 1.25 mg (1.25 mg Nebulization Given 08/17/16 2110)  ipratropium (ATROVENT) nebulizer solution 0.5 mg (0.5 mg Nebulization Given 08/17/16 2110)  methylPREDNISolone sodium succinate (SOLU-MEDROL) 125 mg/2 mL injection 125 mg (125 mg Intravenous Given 08/17/16 2141)  levalbuterol (XOPENEX) nebulizer solution 1.25 mg (1.25 mg Nebulization Given 08/17/16 2142)  ipratropium (ATROVENT)  nebulizer solution 0.5 mg (0.5 mg Nebulization Given 08/17/16 2141)  levalbuterol (XOPENEX) nebulizer solution 1.25 mg (1.25 mg Nebulization Given 08/17/16 2156)  ipratropium (ATROVENT) nebulizer solution 0.5 mg (0.5 mg Nebulization Given 08/17/16 2156)     Initial Impression / Assessment and Plan / ED Course  I have reviewed the triage vital signs and the nursing notes.  Pertinent labs & imaging results that were available during my care of the patient were reviewed by me and considered in my medical decision making (see chart for details).  Clinical Course    64 yo M With a chief complaint of shortness of breath area is been going on for the past 3 or 4 days. He seen by his family physician and started on Omnicef. He said no improvement of the symptoms. He has been using a albuterol inhaler a couple times a day with some minimal improvement. On my initial exam the patient was mildly tachypnic and diminished breath sounds bilaterally. Chest x-ray concerning for left lower lobe pneumonia. Patient was given 3 DuoNeb's back-to-back with no significant improvement. Will start the patient on Rocephin and azithromycin. Admit.  Final Clinical Impressions(s) / ED Diagnoses   Final diagnoses:  LLL pneumonia    New Prescriptions New Prescriptions   No medications on file  I personally performed the services described in this documentation, which was scribed in my presence. The recorded information has been reviewed and is accurate.      Deno Etienne, DO 08/17/16 2257

## 2016-08-18 ENCOUNTER — Encounter (HOSPITAL_COMMUNITY): Payer: Self-pay | Admitting: Internal Medicine

## 2016-08-18 DIAGNOSIS — J189 Pneumonia, unspecified organism: Principal | ICD-10-CM

## 2016-08-18 DIAGNOSIS — I10 Essential (primary) hypertension: Secondary | ICD-10-CM

## 2016-08-18 DIAGNOSIS — E118 Type 2 diabetes mellitus with unspecified complications: Secondary | ICD-10-CM

## 2016-08-18 DIAGNOSIS — Z794 Long term (current) use of insulin: Secondary | ICD-10-CM

## 2016-08-18 LAB — CBC
HEMATOCRIT: 48.7 % (ref 39.0–52.0)
Hemoglobin: 16.4 g/dL (ref 13.0–17.0)
MCH: 30.8 pg (ref 26.0–34.0)
MCHC: 33.7 g/dL (ref 30.0–36.0)
MCV: 91.4 fL (ref 78.0–100.0)
Platelets: 110 10*3/uL — ABNORMAL LOW (ref 150–400)
RBC: 5.33 MIL/uL (ref 4.22–5.81)
RDW: 12.5 % (ref 11.5–15.5)
WBC: 10 10*3/uL (ref 4.0–10.5)

## 2016-08-18 LAB — COMPREHENSIVE METABOLIC PANEL
ALT: 32 U/L (ref 17–63)
ANION GAP: 11 (ref 5–15)
AST: 38 U/L (ref 15–41)
Albumin: 3.5 g/dL (ref 3.5–5.0)
Alkaline Phosphatase: 61 U/L (ref 38–126)
BILIRUBIN TOTAL: 1.4 mg/dL — AB (ref 0.3–1.2)
BUN: 22 mg/dL — AB (ref 6–20)
CO2: 23 mmol/L (ref 22–32)
Calcium: 9 mg/dL (ref 8.9–10.3)
Chloride: 101 mmol/L (ref 101–111)
Creatinine, Ser: 1.59 mg/dL — ABNORMAL HIGH (ref 0.61–1.24)
GFR, EST AFRICAN AMERICAN: 52 mL/min — AB (ref >60)
GFR, EST NON AFRICAN AMERICAN: 45 mL/min — AB (ref >60)
Glucose, Bld: 417 mg/dL — ABNORMAL HIGH (ref 65–99)
POTASSIUM: 3.7 mmol/L (ref 3.5–5.1)
Sodium: 135 mmol/L (ref 135–145)
TOTAL PROTEIN: 7.2 g/dL (ref 6.5–8.1)

## 2016-08-18 LAB — BASIC METABOLIC PANEL
ANION GAP: 9 (ref 5–15)
BUN: 29 mg/dL — ABNORMAL HIGH (ref 6–20)
CO2: 23 mmol/L (ref 22–32)
Calcium: 8.8 mg/dL — ABNORMAL LOW (ref 8.9–10.3)
Chloride: 103 mmol/L (ref 101–111)
Creatinine, Ser: 1.58 mg/dL — ABNORMAL HIGH (ref 0.61–1.24)
GFR calc Af Amer: 52 mL/min — ABNORMAL LOW (ref >60)
GFR, EST NON AFRICAN AMERICAN: 45 mL/min — AB (ref >60)
Glucose, Bld: 515 mg/dL (ref 65–99)
POTASSIUM: 3.5 mmol/L (ref 3.5–5.1)
SODIUM: 135 mmol/L (ref 135–145)

## 2016-08-18 LAB — GLUCOSE, CAPILLARY
GLUCOSE-CAPILLARY: 364 mg/dL — AB (ref 65–99)
GLUCOSE-CAPILLARY: 515 mg/dL — AB (ref 65–99)
GLUCOSE-CAPILLARY: 449 mg/dL — AB (ref 65–99)
GLUCOSE-CAPILLARY: 396 mg/dL — AB (ref 65–99)
GLUCOSE-CAPILLARY: 197 mg/dL — AB (ref 65–99)
GLUCOSE-CAPILLARY: 237 mg/dL — AB (ref 65–99)
GLUCOSE-CAPILLARY: 129 mg/dL — AB (ref 65–99)
Glucose-Capillary: 118 mg/dL — ABNORMAL HIGH (ref 65–99)
Glucose-Capillary: 238 mg/dL — ABNORMAL HIGH (ref 65–99)
Glucose-Capillary: 290 mg/dL — ABNORMAL HIGH (ref 65–99)
Glucose-Capillary: 394 mg/dL — ABNORMAL HIGH (ref 65–99)
Glucose-Capillary: 449 mg/dL — ABNORMAL HIGH (ref 65–99)

## 2016-08-18 LAB — MRSA PCR SCREENING: MRSA BY PCR: NEGATIVE

## 2016-08-18 MED ORDER — DEXTROSE-NACL 5-0.45 % IV SOLN
INTRAVENOUS | Status: DC
  Administered 2016-08-18: 20:00:00 via INTRAVENOUS

## 2016-08-18 MED ORDER — INSULIN ASPART 100 UNIT/ML ~~LOC~~ SOLN
25.0000 [IU] | Freq: Once | SUBCUTANEOUS | Status: AC
  Administered 2016-08-18: 25 [IU] via SUBCUTANEOUS

## 2016-08-18 MED ORDER — CLOPIDOGREL BISULFATE 75 MG PO TABS
75.0000 mg | ORAL_TABLET | Freq: Every day | ORAL | Status: DC
  Administered 2016-08-18 – 2016-08-20 (×3): 75 mg via ORAL
  Filled 2016-08-18 (×3): qty 1

## 2016-08-18 MED ORDER — ACETAMINOPHEN 650 MG RE SUPP: 650.0000 mg | Freq: Four times a day (QID) | RECTAL | Status: DC | PRN

## 2016-08-18 MED ORDER — INSULIN ASPART 100 UNIT/ML ~~LOC~~ SOLN: 0.0000 [IU] | Freq: Every day | SUBCUTANEOUS | Status: DC

## 2016-08-18 MED ORDER — SODIUM CHLORIDE 0.9% FLUSH
3.0000 mL | Freq: Two times a day (BID) | INTRAVENOUS | Status: DC
Start: 2016-08-18 — End: 2016-08-20
  Administered 2016-08-18 – 2016-08-20 (×5): 3 mL via INTRAVENOUS

## 2016-08-18 MED ORDER — ROSUVASTATIN CALCIUM 10 MG PO TABS
20.0000 mg | ORAL_TABLET | Freq: Every day | ORAL | Status: DC
  Administered 2016-08-18 – 2016-08-20 (×3): 20 mg via ORAL
  Filled 2016-08-18: qty 2
  Filled 2016-08-18: qty 1
  Filled 2016-08-18: qty 2

## 2016-08-18 MED ORDER — INSULIN ASPART 100 UNIT/ML ~~LOC~~ SOLN: 4.0000 [IU] | Freq: Three times a day (TID) | SUBCUTANEOUS | Status: DC

## 2016-08-18 MED ORDER — ENOXAPARIN SODIUM 40 MG/0.4ML ~~LOC~~ SOLN
40.0000 mg | SUBCUTANEOUS | Status: DC
  Administered 2016-08-18 – 2016-08-20 (×3): 40 mg via SUBCUTANEOUS
  Filled 2016-08-18 (×3): qty 0.4

## 2016-08-18 MED ORDER — INSULIN ASPART 100 UNIT/ML ~~LOC~~ SOLN: 0.0000 [IU] | Freq: Three times a day (TID) | SUBCUTANEOUS | Status: DC

## 2016-08-18 MED ORDER — IRBESARTAN 75 MG PO TABS: 37.5000 mg | ORAL_TABLET | Freq: Every day | ORAL | Status: DC

## 2016-08-18 MED ORDER — INSULIN GLARGINE 100 UNIT/ML ~~LOC~~ SOLN: 20.0000 [IU] | Freq: Every day | SUBCUTANEOUS | Status: DC

## 2016-08-18 MED ORDER — CYANOCOBALAMIN 1000 MCG/ML IJ SOLN: 1000.0000 ug | INTRAMUSCULAR | Status: DC

## 2016-08-18 MED ORDER — SODIUM CHLORIDE 0.9 % IV SOLN
INTRAVENOUS | Status: DC
  Administered 2016-08-18: 03:00:00 via INTRAVENOUS

## 2016-08-18 MED ORDER — IRBESARTAN 75 MG PO TABS
37.5000 mg | ORAL_TABLET | Freq: Every day | ORAL | Status: DC
  Filled 2016-08-18: qty 0.5

## 2016-08-18 MED ORDER — HYDROCOD POLST-CPM POLST ER 10-8 MG/5ML PO SUER
5.0000 mL | Freq: Two times a day (BID) | ORAL | Status: DC | PRN
  Administered 2016-08-18 – 2016-08-19 (×3): 5 mL via ORAL
  Filled 2016-08-18 (×3): qty 5

## 2016-08-18 MED ORDER — FUROSEMIDE 20 MG PO TABS
20.0000 mg | ORAL_TABLET | Freq: Every day | ORAL | Status: DC
  Administered 2016-08-18: 20 mg via ORAL
  Filled 2016-08-18: qty 1

## 2016-08-18 MED ORDER — GLIPIZIDE 10 MG PO TABS
10.0000 mg | ORAL_TABLET | Freq: Every day | ORAL | Status: DC
  Administered 2016-08-18: 10 mg via ORAL
  Filled 2016-08-18: qty 1

## 2016-08-18 MED ORDER — DEXTROSE 50 % IV SOLN: 25.0000 mL | INTRAVENOUS | Status: DC | PRN

## 2016-08-18 MED ORDER — INSULIN ASPART 100 UNIT/ML ~~LOC~~ SOLN
20.0000 [IU] | Freq: Once | SUBCUTANEOUS | Status: AC
  Administered 2016-08-18: 20 [IU] via SUBCUTANEOUS

## 2016-08-18 MED ORDER — LEVALBUTEROL HCL 0.63 MG/3ML IN NEBU: 0.6300 mg | INHALATION_SOLUTION | RESPIRATORY_TRACT | Status: DC | PRN

## 2016-08-18 MED ORDER — INSULIN REGULAR BOLUS VIA INFUSION
0.0000 [IU] | Freq: Three times a day (TID) | INTRAVENOUS | Status: DC
  Filled 2016-08-18: qty 10

## 2016-08-18 MED ORDER — BENZONATATE 100 MG PO CAPS: 100.0000 mg | ORAL_CAPSULE | Freq: Three times a day (TID) | ORAL | Status: DC | PRN

## 2016-08-18 MED ORDER — VITAMIN B-12 1000 MCG PO TABS: 1000.0000 ug | ORAL_TABLET | ORAL | Status: DC

## 2016-08-18 MED ORDER — ASPIRIN EC 81 MG PO TBEC
81.0000 mg | DELAYED_RELEASE_TABLET | Freq: Every day | ORAL | Status: DC
  Administered 2016-08-18 – 2016-08-20 (×3): 81 mg via ORAL
  Filled 2016-08-18 (×3): qty 1

## 2016-08-18 MED ORDER — METOPROLOL SUCCINATE ER 25 MG PO TB24
12.5000 mg | ORAL_TABLET | Freq: Every day | ORAL | Status: DC
  Administered 2016-08-18 – 2016-08-20 (×3): 12.5 mg via ORAL
  Filled 2016-08-18 (×3): qty 1

## 2016-08-18 MED ORDER — SODIUM CHLORIDE 0.9 % IV SOLN
INTRAVENOUS | Status: DC
  Administered 2016-08-18: 3.3 [IU]/h via INTRAVENOUS
  Filled 2016-08-18: qty 2.5

## 2016-08-18 MED ORDER — SODIUM CHLORIDE 0.9 % IV SOLN
INTRAVENOUS | Status: DC
  Administered 2016-08-18 – 2016-08-20 (×3): via INTRAVENOUS

## 2016-08-18 MED ORDER — INSULIN GLARGINE 100 UNIT/ML ~~LOC~~ SOLN
20.0000 [IU] | Freq: Every day | SUBCUTANEOUS | Status: DC
  Administered 2016-08-18: 20 [IU] via SUBCUTANEOUS
  Filled 2016-08-18: qty 0.2

## 2016-08-18 MED ORDER — LEVOFLOXACIN IN D5W 750 MG/150ML IV SOLN
750.0000 mg | INTRAVENOUS | Status: DC
  Administered 2016-08-18: 750 mg via INTRAVENOUS
  Filled 2016-08-18: qty 150

## 2016-08-18 MED ORDER — INSULIN GLARGINE 100 UNIT/ML ~~LOC~~ SOLN
15.0000 [IU] | Freq: Every day | SUBCUTANEOUS | Status: DC
  Filled 2016-08-18: qty 0.15

## 2016-08-18 MED ORDER — ACETAMINOPHEN 325 MG PO TABS: 650.0000 mg | ORAL_TABLET | Freq: Four times a day (QID) | ORAL | Status: DC | PRN

## 2016-08-18 NOTE — H&P (Addendum)
TRH H&P   Patient Demographics:    [name] [name], is a 65 y.o. male  MRN: [id number]   DOB - [birth year]  Admit Date - 08/17/2016  Outpatient Primary MD for the patient is Pcp Not In System  . [name]  ([place], [place])  Referring MD/NP/PA:  Olegario Shearer  Outpatient Specialists:   Patient coming from: home  Chief Complaint  Patient presents with  . Cough      HPI:    [name] [name]  is a 65 y.o. male, w Dm2, CAD s/p stent apparently c/o cough w yellow sputum. X 5 days.   + subjective fever.  Slight dyspnea.  Pt denies cp, palp, n/v, diarrhea, brbpr, black stool.   In ED.  CXR showed LLL pneumonia.  Pt will be admitted for pneumonia , CAP    Review of systems:    In addition to the HPI above,   No Headache, No changes with Vision or hearing, No problems swallowing food or Liquids, No Chest pain, No Abdominal pain, No Nausea or Vommitting, Bowel movements are regular, No Blood in stool or Urine, No dysuria, No new skin rashes or bruises, No new joints pains-aches,  No new weakness, tingling, numbness in any extremity, No recent weight gain or loss, No polyuria, polydypsia or polyphagia, No significant Mental Stressors.  A full 10 point Review of Systems was done, except as stated above, all other Review of Systems were negative.   With Past History of the following :    Past Medical History:  Diagnosis Date  . 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 DM (diabetes mellitus) ([place])   . HTN (hypertension)   . Hyperlipidemia       Past Surgical History:  Procedure Laterality Date  . CORONARY ANGIOPLASTY WITH STENT PLACEMENT    . LAPAROSCOPIC INGUINAL HERNIA REPAIR        Social History:     Social History  Substance Use Topics  . Smoking  status: Former Smoker    Packs/day: 0.50    Years: 10.00  . Smokeless tobacco: Never Used  . Alcohol use No     Lives - at home  Mobility -  Ambulates by self.     Family History :     Family History  Problem Relation Age of Onset  . Hypertension Mother   . Cancer Mother   . Heart attack Father   . Hypertension Father   . Diabetes Father   . Hypertension Brother   . Irregular heart beat Brother       Home Medications:   Prior to Admission medications   Medication Sig Start Date End Date Taking? Authorizing Provider  cefdinir (OMNICEF) 300 MG capsule Take 300 mg by mouth 2 (  two) times daily.   Yes Historical Provider, MD  levalbuterol [facility] HFA) 45 MCG/ACT inhaler Inhale into the lungs every 4 (four) hours as needed for wheezing.   Yes Historical Provider, MD  amLODipine (NORVASC) 10 MG tablet Take 10 mg by mouth daily.      Historical Provider, MD  aspirin 81 MG tablet Take 81 mg by mouth daily.      Historical Provider, MD  clopidogrel (PLAVIX) 75 MG tablet Take 1 tablet (75 mg total) by mouth daily. 07/15/12   Jolaine Artist, MD  cyanocobalamin 1000 MCG tablet Take 1,000 mcg by mouth every 14 (fourteen) days.    Historical Provider, MD  furosemide (LASIX) 20 MG tablet Take 20 mg by mouth as needed.    Historical Provider, MD  glipiZIDE (GLUCOTROL) 10 MG tablet Take 10 mg by mouth daily.      Historical Provider, MD  insulin glargine (LANTUS) 100 UNIT/ML injection Inject 13 Units into the skin as directed.     Historical Provider, MD  metoprolol succinate (TOPROL-XL) 25 MG 24 hr tablet Take 12.5 mg by mouth daily.    Historical Provider, MD  rosuvastatin (CRESTOR) 20 MG tablet Take 20 mg by mouth daily.      Historical Provider, MD  telmisartan (MICARDIS) 80 MG tablet Take 80 mg by mouth daily.      Historical Provider, MD     Allergies:     Allergies  Allergen Reactions  . Fish Oil   . Penicillins   . Ramipril Cough  . Amoxicillin Rash     Physical  Exam:   Vitals  Blood pressure (!) 93/54, pulse 84, temperature 98.1 F (36.7 C), temperature source Oral, resp. rate 20, height 6\' 1"  (1.854 m), weight 106.3 kg (234 lb 5.6 oz), SpO2 94 %.   1. General  lying in bed in NAD,    2. Normal affect and insight, Not Suicidal or Homicidal, Awake Alert, Oriented X 3.  3. No F.N deficits, ALL C.Nerves Intact, Strength 5/5 all 4 extremities, Sensation intact all 4 extremities, Plantars down going.  4. Ears and Eyes appear Normal, Conjunctivae clear, PERRLA. Moist Oral Mucosa.  5. Supple Neck, No JVD, No cervical lymphadenopathy appriciated, No Carotid Bruits.  6. Symmetrical Chest wall movement, Good air movement bilaterally, + crackles, LLL base, no wheeze  7. RRR, No Gallops, Rubs or Murmurs, No Parasternal Heave.  8. Positive Bowel Sounds, Abdomen Soft, No tenderness, No organomegaly appriciated,No rebound -guarding or rigidity.  9.  No Cyanosis, Normal Skin Turgor, No Skin Rash or Bruise.  10. Good muscle tone,  joints appear normal , no effusions, Normal ROM.  11. No Palpable Lymph Nodes in Neck or Axillae     Data Review:    CBC  Recent Labs Lab 08/17/16 2055  WBC 9.2  HGB 16.5  HCT 47.7  PLT 109*  MCV 89.2  MCH 30.8  MCHC 34.6  RDW 12.5  LYMPHSABS 0.7  MONOABS 1.0  EOSABS 0.0  BASOSABS 0.0   ------------------------------------------------------------------------------------------------------------------  Chemistries   Recent Labs Lab 08/17/16 2055  NA 136  K 3.7  CL 101  CO2 26  GLUCOSE 214*  BUN 18  CREATININE 1.35*  CALCIUM 8.9  AST 37  ALT 28  ALKPHOS 58  BILITOT 1.9*   ------------------------------------------------------------------------------------------------------------------ estimated creatinine clearance is 71.7 mL/min (by C-G formula based on SCr of 1.35 mg/dL  (H)). ------------------------------------------------------------------------------------------------------------------ No results for input(s): TSH, T4TOTAL, T3FREE, THYROIDAB in the last 72 hours.  Invalid  input(s): FREET3  Coagulation profile No results for input(s): INR, PROTIME in the last 168 hours. ------------------------------------------------------------------------------------------------------------------- No results for input(s): DDIMER in the last 72 hours. -------------------------------------------------------------------------------------------------------------------  Cardiac Enzymes No results for input(s): CKMB, TROPONINI, MYOGLOBIN in the last 168 hours.  Invalid input(s): CK ------------------------------------------------------------------------------------------------------------------ No results found for: BNP   ---------------------------------------------------------------------------------------------------------------  Urinalysis No results found for: COLORURINE, APPEARANCEUR, [place], [place], [place], [place], BILIRUBINUR, KETONESUR, PROTEINUR, UROBILINOGEN, NITRITE, LEUKOCYTESUR  ----------------------------------------------------------------------------------------------------------------   Imaging Results:    Dg Chest 2 View  Result Date: 08/17/2016 CLINICAL DATA:  65 year old with 4 day history of cough and chest congestion. Former smoker. Current history of coronary artery disease post coronary stenting. EXAM: CHEST  2 VIEW COMPARISON:  02/09/2011, 10/20/2009, 08/23/2008. FINDINGS: Suboptimal inspiration. Taking this into account, cardiac silhouette upper normal in size, unchanged. Hilar and mediastinal contours otherwise unremarkable. Airspace consolidation involving the left lower lobe. Lungs otherwise clear. Pulmonary vascularity normal. No pleural effusions. Visualized bony thorax intact. IMPRESSION: Suboptimal inspiration.  Acute left lower  lobe pneumonia. Electronically Signed   By: Evangeline Dakin M.D.   On: 08/17/2016 22:17      Assessment & Plan:    Principal Problem:   CAP (community acquired pneumonia) Active Problems:   HYPERTENSION, BENIGN   Type II diabetes mellitus with manifestations ([place])    1. CAP Check sputum gram stain, culture Check blood culture x2 Check urine legionella antigen Start levaquin iv pharmacy to dose  2. Dm2 fsbs ac and qhs, iss Hold off on glipizide due to risk of hypoglycemia w levaquin  3. Hypertension bp soft Hold off on amlodipine, continue other bp medications.   4. Renal insufficiency Hydrate with ns iv Check cmp in am  DVT Prophylaxis Lovenox - SCDs   AM Labs Ordered, also please review Full Orders  Family Communication: Admission, patients condition and plan of care including tests being ordered have been discussed with the patient  who indicate understanding and agree with the plan and Code Status.  Code Status FULL CODE  Likely DC to  home  Condition GUARDED    Consults called:   Admission status: inpatient  Time spent in minutes : 45 minutes   Jani Gravel M.D on 08/18/2016 at 2:33 AM  Between 7am to 7pm - Pager - [phone]. After 7pm go to [email or web address] - password [email or web address]  [facility] - Office  [phone]

## 2016-08-18 NOTE — ED Notes (Signed)
Attempted report to floor.  

## 2016-08-18 NOTE — Progress Notes (Signed)
Dr. Maudie Mercury with pt.- informed of cbg 364.

## 2016-08-18 NOTE — Progress Notes (Signed)
Pt. transferred from [place] to [place]- 13; alert and oriented x4; skin intact; some coughing noted. Oriented to room.

## 2016-08-18 NOTE — Plan of Care (Signed)
Problem: Education: Goal: Knowledge of [facility] Education information/materials will improve Outcome: Progressing POC reviewed with pt.   

## 2016-08-18 NOTE — Progress Notes (Addendum)
PROGRESS NOTE        PATIENT DETAILS Name: [name] [name] Age: 65 y.o. Sex: male Date of Birth: November 05, 1951 Admit Date: 08/17/2016 Admitting Physician Jani Gravel, MD PCP:Pcp Not In System  Brief Narrative: Patient is a 65 y.o. male with history of type 2 diabetes, hypertension admitted with several days history of cold like symptoms, cough and slight dyspnea. Found to have community acquired pneumonia and admitted to the hospitalist service for further evaluation and treatment.   Subjective: Feels better than yesterday. Not much shortness of breath today. Continues to cough.  No chest pain Nausea vomiting  Assessment/Plan: Community-acquired pneumonia: Clinically improved-less cough and fever curve better-no leukocytosis I think continue empiric levofloxacin. Await culture data.  Uncontrolled type 2 diabetes: Claims CBGs were at home are controlled with just 15 units of Lantus and glipizide-CBGs are currently uncontrolled (given 125 mg of Solu-Medrol in the emergency room)-given numerous doses of NovoLog insulin this morning-waiting repeat CBC, if still uncontrolled-will require insulin infusion. Await A1c  Addendum 1:55 PM  CBGs still > 400-has received numerous subcutaneous NovoLog this morning-we will place the patient on insulin infusion, transfer to stepdown, once CBGs are better controlled we will attempt to get him off the insulin drip and transition him back to subcutaneous insulin.   Acute kidney injury: Suspect hemodynamically mediated-secondary to pneumonia and uncontrolled diabetes.Continue to hold the Micardis-discontinue Lasix for now. Hydrate, supportive measures and follow electrolytes.   Hypertension: Blood pressure currently soft-continue to hold amlodipineMicardis,-cautiously continue with metoprolol  History of CAD: Currently chest pain-free-continue aspirin/Plavix/beta blocker/statin.  Thrombocytopenia: Mild-seems to be a chronic  issue-follow for now  DVT Prophylaxis: Prophylactic Lovenox   Code Status: Full code  Family Communication: None at bedside  Disposition Plan: Remain inpatient-requires several more days of hospitalization prior to discharge  Antimicrobial agents: Levofloxacin 9/29>>  Procedures: None  CONSULTS:  None  Time spent: 25 minutes-Greater than 50% of this time was spent in counseling, explanation of diagnosis, planning of further management, and coordination of care.  MEDICATIONS: Anti-infectives    Start     Dose/Rate Route Frequency Ordered Stop   08/18/16 1000  levofloxacin (LEVAQUIN) IVPB 750 mg    Comments:  Levaquin 750 mg IV q24h for CrCl > 50 mL/min   750 mg 100 mL/hr over 90 Minutes Intravenous Every 24 hours 08/18/16 0227     08/17/16 2305  azithromycin (ZITHROMAX) 500 MG injection    Comments:  Miguel Rota   : cabinet override      08/17/16 2305 08/18/16 1114   08/17/16 2245  cefTRIAXone (ROCEPHIN) 1 g in dextrose 5 % 50 mL IVPB     1 g 100 mL/hr over 30 Minutes Intravenous  Once 08/17/16 2230 08/17/16 2314   08/17/16 2230  azithromycin (ZITHROMAX) 500 mg in dextrose 5 % 250 mL IVPB     500 mg 250 mL/hr over 60 Minutes Intravenous  Once 08/17/16 2228 08/18/16 0022   08/17/16 2230  cefTRIAXone (ROCEPHIN) injection 1 g  Status:  Discontinued     1 g Intramuscular  Once 08/17/16 2228 08/17/16 2230      Scheduled Meds: . aspirin EC  81 mg Oral Daily  . clopidogrel  75 mg Oral Daily  . [START ON 08/25/2016] cyanocobalamin  1,000 mcg Intramuscular Q14 Days  . enoxaparin (LOVENOX) injection  40 mg Subcutaneous Q24H  .  furosemide  20 mg Oral Daily  . glipiZIDE  10 mg Oral Q breakfast  . insulin aspart  0-20 Units Subcutaneous TID WC  . insulin aspart  0-5 Units Subcutaneous QHS  . insulin aspart  4 Units Subcutaneous TID WC  . insulin glargine  20 Units Subcutaneous Daily  . [START ON 08/19/2016] irbesartan  37.5 mg Oral Daily  . levofloxacin (LEVAQUIN) IV  750  mg Intravenous Q24H  . metoprolol succinate  12.5 mg Oral Daily  . rosuvastatin  20 mg Oral Daily  . sodium chloride flush  3 mL Intravenous Q12H   Continuous Infusions:  PRN Meds:.acetaminophen **OR** acetaminophen, benzonatate, chlorpheniramine-HYDROcodone, levalbuterol   PHYSICAL EXAM: Vital signs: Vitals:   08/18/16 0030 08/18/16 0126 08/18/16 0511 08/18/16 0943  BP: 112/69 (!) 93/54 126/73 107/60  Pulse: 90 84 80 76  Resp: 19 20 19 17   Temp: 97.9 F (36.6 C) 98.1 F (36.7 C) 98.2 F (36.8 C) 97.7 F (36.5 C)  TempSrc: Oral Oral Oral Oral  SpO2: 93% 94% 94% 92%  Weight:  106.3 kg (234 lb 5.6 oz)    Height:  6\' 1"  ([id number] m)     Filed Weights   08/18/16 0126  Weight: 106.3 kg (234 lb 5.6 oz)   Body mass index is 30.92 kg/m.   General appearance :Awake, alert, not in any distress. Speech Clear. Not toxic Looking Eyes:, pupils equally reactive to light and accomodation,no scleral icterus.Pink conjunctiva HEENT: Atraumatic and Normocephalic Neck: supple, no JVD. No cervical lymphadenopathy. No thyromegaly Resp:Good air entry bilaterally, no added sounds  CVS: S1 S2 regular, no murmurs.  GI: Bowel sounds present, Non tender and not distended with no gaurding, rigidity or rebound.No organomegaly Extremities: B/L Lower Ext shows no edema, both legs are warm to touch Neurology:  speech clear,Non focal, sensation is grossly intact. Psychiatric: Normal judgment and insight. Alert and oriented x 3. Normal mood. Musculoskeletal:gait appears to be normal.No digital cyanosis Skin:No Rash, warm and dry Wounds:N/A  I have personally reviewed following labs and imaging studies  LABORATORY DATA: CBC:  Recent Labs Lab 08/17/16 2055 08/18/16 0522  WBC 9.2 10.0  NEUTROABS 7.4  --   HGB 16.5 16.4  HCT 47.7 48.7  MCV 89.2 91.4  PLT 109* 110*    Basic Metabolic Panel:  Recent Labs Lab 08/17/16 2055 08/18/16 0522 08/18/16 1150  NA 136 135 135  K 3.7 3.7 3.5  CL 101  101 103  CO2 26 23 23   GLUCOSE 214* 417* 515*  BUN 18 22* 29*  CREATININE 1.35* 1.59* 1.58*  CALCIUM 8.9 9.0 8.8*    GFR: Estimated Creatinine Clearance: 61.3 mL/min (by C-G formula based on SCr of 1.58 mg/dL (H)).  Liver Function Tests:  Recent Labs Lab 08/17/16 2055 08/18/16 0522  AST 37 38  ALT 28 32  ALKPHOS 58 61  BILITOT 1.9* 1.4*  PROT 7.3 7.2  ALBUMIN 3.8 3.5   No results for input(s): LIPASE, AMYLASE in the last 168 hours. No results for input(s): AMMONIA in the last 168 hours.  Coagulation Profile: No results for input(s): INR, PROTIME in the last 168 hours.  Cardiac Enzymes: No results for input(s): CKTOTAL, CKMB, CKMBINDEX, TROPONINI in the last 168 hours.  BNP (last 3 results) No results for input(s): PROBNP in the last 8760 hours.  HbA1C: No results for input(s): HGBA1C in the last 72 hours.  CBG:  Recent Labs Lab 08/18/16 0123 08/18/16 0759 08/18/16 1033  GLUCAP 364* 449* 515*  Lipid Profile: No results for input(s): CHOL, HDL, LDLCALC, TRIG, CHOLHDL, LDLDIRECT in the last 72 hours.  Thyroid Function Tests: No results for input(s): TSH, T4TOTAL, FREET4, T3FREE, THYROIDAB in the last 72 hours.  Anemia Panel: No results for input(s): VITAMINB12, FOLATE, FERRITIN, TIBC, IRON, RETICCTPCT in the last 72 hours.  Urine analysis: No results found for: COLORURINE, APPEARANCEUR, LABSPEC, PHURINE, GLUCOSEU, HGBUR, BILIRUBINUR, KETONESUR, PROTEINUR, UROBILINOGEN, NITRITE, LEUKOCYTESUR  Sepsis Labs: Lactic Acid, Venous    Component Value Date/Time   LATICACIDVEN 0.91 08/17/2016 2107    MICROBIOLOGY: No results found for this or any previous visit (from the past 240 hour(s)).  RADIOLOGY STUDIES/RESULTS: Dg Chest 2 View  Result Date: 08/17/2016 CLINICAL DATA:  65 year old with 4 day history of cough and chest congestion. Former smoker. Current history of coronary artery disease post coronary stenting. EXAM: CHEST  2 VIEW COMPARISON:   02/09/2011, 10/20/2009, 08/23/2008. FINDINGS: Suboptimal inspiration. Taking this into account, cardiac silhouette upper normal in size, unchanged. Hilar and mediastinal contours otherwise unremarkable. Airspace consolidation involving the left lower lobe. Lungs otherwise clear. Pulmonary vascularity normal. No pleural effusions. Visualized bony thorax intact. IMPRESSION: Suboptimal inspiration.  Acute left lower lobe pneumonia. Electronically Signed   By: Evangeline Dakin M.D.   On: 08/17/2016 22:17     LOS: 1 day   Oren Binet, MD  [facility] Pager:[phone]  If 7PM-7AM, please contact night-coverage [email or web address] Password [email or web address] 08/18/2016, 1:27 PM

## 2016-08-18 NOTE — ED Notes (Addendum)
Attempted report to floor.  

## 2016-08-19 LAB — BLOOD CULTURE ID PANEL (REFLEXED)
ACINETOBACTER BAUMANNII: NOT DETECTED
CANDIDA ALBICANS: NOT DETECTED
CANDIDA GLABRATA: NOT DETECTED
CANDIDA PARAPSILOSIS: NOT DETECTED
Candida krusei: NOT DETECTED
Candida tropicalis: NOT DETECTED
ENTEROBACTER CLOACAE COMPLEX: NOT DETECTED
ENTEROBACTERIACEAE SPECIES: NOT DETECTED
ENTEROCOCCUS SPECIES: NOT DETECTED
Escherichia coli: NOT DETECTED
HAEMOPHILUS INFLUENZAE: NOT DETECTED
KLEBSIELLA OXYTOCA: NOT DETECTED
Klebsiella pneumoniae: NOT DETECTED
LISTERIA MONOCYTOGENES: NOT DETECTED
NEISSERIA MENINGITIDIS: NOT DETECTED
Proteus species: NOT DETECTED
Pseudomonas aeruginosa: NOT DETECTED
STREPTOCOCCUS PYOGENES: NOT DETECTED
STREPTOCOCCUS SPECIES: DETECTED — AB
Serratia marcescens: NOT DETECTED
Staphylococcus aureus (BCID): NOT DETECTED
Staphylococcus species: NOT DETECTED
Streptococcus agalactiae: NOT DETECTED
Streptococcus pneumoniae: NOT DETECTED

## 2016-08-19 LAB — GLUCOSE, CAPILLARY
GLUCOSE-CAPILLARY: 135 mg/dL — AB (ref 65–99)
GLUCOSE-CAPILLARY: 176 mg/dL — AB (ref 65–99)
GLUCOSE-CAPILLARY: 206 mg/dL — AB (ref 65–99)
GLUCOSE-CAPILLARY: 228 mg/dL — AB (ref 65–99)
GLUCOSE-CAPILLARY: 340 mg/dL — AB (ref 65–99)
Glucose-Capillary: 146 mg/dL — ABNORMAL HIGH (ref 65–99)
Glucose-Capillary: 181 mg/dL — ABNORMAL HIGH (ref 65–99)
Glucose-Capillary: 219 mg/dL — ABNORMAL HIGH (ref 65–99)

## 2016-08-19 LAB — CBC
HEMATOCRIT: 46.7 % (ref 39.0–52.0)
HEMOGLOBIN: 16.1 g/dL (ref 13.0–17.0)
MCH: 31.1 pg (ref 26.0–34.0)
MCHC: 34.5 g/dL (ref 30.0–36.0)
MCV: 90.3 fL (ref 78.0–100.0)
Platelets: 121 10*3/uL — ABNORMAL LOW (ref 150–400)
RBC: 5.17 MIL/uL (ref 4.22–5.81)
RDW: 12.3 % (ref 11.5–15.5)
WBC: 17.6 10*3/uL — AB (ref 4.0–10.5)

## 2016-08-19 LAB — BASIC METABOLIC PANEL
Anion gap: 8 (ref 5–15)
BUN: 32 mg/dL — AB (ref 6–20)
CHLORIDE: 109 mmol/L (ref 101–111)
CO2: 24 mmol/L (ref 22–32)
CREATININE: 1.24 mg/dL (ref 0.61–1.24)
Calcium: 9 mg/dL (ref 8.9–10.3)
GFR calc Af Amer: >60 mL/min (ref >60)
GFR calc non Af Amer: >60 mL/min (ref >60)
Glucose, Bld: 153 mg/dL — ABNORMAL HIGH (ref 65–99)
POTASSIUM: 4.1 mmol/L (ref 3.5–5.1)
Sodium: 141 mmol/L (ref 135–145)

## 2016-08-19 LAB — LEGIONELLA PNEUMOPHILA SEROGP 1 UR AG: L. pneumophila Serogp 1 Ur Ag: NEGATIVE

## 2016-08-19 LAB — HEMOGLOBIN A1C
Hgb A1c MFr Bld: 7.4 % — ABNORMAL HIGH (ref 4.8–5.6)
MEAN PLASMA GLUCOSE: 166 mg/dL

## 2016-08-19 MED ORDER — INSULIN DETEMIR 100 UNIT/ML ~~LOC~~ SOLN
10.0000 [IU] | Freq: Once | SUBCUTANEOUS | Status: AC
  Administered 2016-08-19: 10 [IU] via SUBCUTANEOUS
  Filled 2016-08-19: qty 0.1

## 2016-08-19 MED ORDER — DEXTROSE 5 % IV SOLN
500.0000 mg | INTRAVENOUS | Status: DC
  Administered 2016-08-19 – 2016-08-20 (×2): 500 mg via INTRAVENOUS
  Filled 2016-08-19 (×2): qty 500

## 2016-08-19 MED ORDER — INSULIN DETEMIR 100 UNIT/ML ~~LOC~~ SOLN
10.0000 [IU] | Freq: Once | SUBCUTANEOUS | Status: DC
  Filled 2016-08-19: qty 0.1

## 2016-08-19 MED ORDER — PNEUMOCOCCAL VAC POLYVALENT 25 MCG/0.5ML IJ INJ
0.5000 mL | INJECTION | INTRAMUSCULAR | Status: AC
  Administered 2016-08-20: 0.5 mL via INTRAMUSCULAR
  Filled 2016-08-19: qty 0.5

## 2016-08-19 MED ORDER — INSULIN ASPART 100 UNIT/ML ~~LOC~~ SOLN
0.0000 [IU] | Freq: Three times a day (TID) | SUBCUTANEOUS | Status: DC
  Administered 2016-08-19: 8 [IU] via SUBCUTANEOUS
  Administered 2016-08-19: 16 [IU] via SUBCUTANEOUS
  Administered 2016-08-19: 8 [IU] via SUBCUTANEOUS
  Administered 2016-08-20 (×2): 2 [IU] via SUBCUTANEOUS

## 2016-08-19 MED ORDER — DEXTROSE 5 % IV SOLN
2.0000 g | INTRAVENOUS | Status: DC
  Administered 2016-08-19 – 2016-08-20 (×2): 2 g via INTRAVENOUS
  Filled 2016-08-19 (×2): qty 2

## 2016-08-19 MED ORDER — INSULIN DETEMIR 100 UNIT/ML ~~LOC~~ SOLN
10.0000 [IU] | Freq: Every day | SUBCUTANEOUS | Status: DC
  Administered 2016-08-19: 10 [IU] via SUBCUTANEOUS
  Filled 2016-08-19: qty 0.1

## 2016-08-19 MED ORDER — ORAL CARE MOUTH RINSE: 15.0000 mL | Freq: Two times a day (BID) | OROMUCOSAL | Status: DC

## 2016-08-19 MED ORDER — ALPRAZOLAM 0.5 MG PO TABS
0.5000 mg | ORAL_TABLET | Freq: Once | ORAL | Status: AC
  Administered 2016-08-19: 0.5 mg via ORAL
  Filled 2016-08-19: qty 1

## 2016-08-19 NOTE — Progress Notes (Signed)
PROGRESS NOTE        PATIENT DETAILS Name: [name] [name] Age: 65 y.o. Sex: male Date of Birth: October 25, 1951 Admit Date: 08/17/2016 Admitting Physician Jani Gravel, MD PCP:Pcp Not In System  Brief Narrative: Patient is a 65 y.o. male with history of type 2 diabetes, hypertension admitted with several days history of cold like symptoms, cough and slight dyspnea. Found to have community acquired pneumonia and admitted to the hospitalist service for further evaluation and treatment. He was transferred to step down 9/29 for glucommander after getting IV steroids in the ER, but sugars are better now and off the insulin drip. Concern for strep species in 1/2 bottles of blood culture 9/30 so Levaquin changed to Rocephin/Azithromycin.  Subjective: Feels better than yesterday. No shortness of breath today. Continues to cough with green/yellow sputum. No fevers, no chest pain.  Assessment/Plan: Community-acquired pneumonia: Clinically improved-less cough and fever curve better- leukocytosis due to steroids. Change to Azithro/Rocephin due to pos blood culture.  Bacteremia Clinically he looks good, this is likely contaminant. Follow BC results, will obtain repeat culture x2 this AM.  Uncontrolled type 2 diabetes: Claims CBGs were at home are controlled with just 15 units of Lantus and glipizide-CBGs are currently uncontrolled (given 125 mg of Solu-Medrol in the emergency room). Now his sugars are better, on baseline Levemir 10 units qHS and SSI. I will discontinue steroids today as he is not wheezing, and will help [place] stabilize his sugars. Out of [place] if sugars still acceptable at lunch time. Await A1c  Acute kidney injury: Suspect hemodynamically mediated-secondary to pneumonia and uncontrolled diabetes.Continue to hold the Micardis-discontinue Lasix for now. Hydrate, supportive measures and follow electrolytes. Cr improved to normal, will follow daily.  Hypertension:  Blood pressure normal, but will continue to hold amlodipineMicardis,-cautiously continue with metoprolol  History of CAD: Currently chest pain-free-continue aspirin/Plavix/beta blocker/statin.  Thrombocytopenia: Mild-seems to be a chronic issue-follow for now  DVT Prophylaxis: Prophylactic Lovenox   Code Status: Full code  Family Communication: None at bedside  Disposition Plan: Remain inpatient-requires 24-48 hours of hospitalization prior to discharge  Antimicrobial agents: Levofloxacin 9/29>>9/30 Azithro/Rocephin 9/30 -->  Procedures: None  CONSULTS:  None  Time spent: 25 minutes-Greater than 50% of this time was spent in counseling, explanation of diagnosis, planning of further management, and coordination of care.  MEDICATIONS: Anti-infectives    Start     Dose/Rate Route Frequency Ordered Stop   08/18/16 1000  levofloxacin (LEVAQUIN) IVPB 750 mg    Comments:  Levaquin 750 mg IV q24h for CrCl > 50 mL/min   750 mg 100 mL/hr over 90 Minutes Intravenous Every 24 hours 08/18/16 0227     08/17/16 2305  azithromycin (ZITHROMAX) 500 MG injection    Comments:  Miguel Rota   : cabinet override      08/17/16 2305 08/18/16 1114   08/17/16 2245  cefTRIAXone (ROCEPHIN) 1 g in dextrose 5 % 50 mL IVPB     1 g 100 mL/hr over 30 Minutes Intravenous  Once 08/17/16 2230 08/17/16 2314   08/17/16 2230  azithromycin (ZITHROMAX) 500 mg in dextrose 5 % 250 mL IVPB     500 mg 250 mL/hr over 60 Minutes Intravenous  Once 08/17/16 2228 08/18/16 0022   08/17/16 2230  cefTRIAXone (ROCEPHIN) injection 1 g  Status:  Discontinued     1 g Intramuscular  Once 08/17/16 2228 08/17/16 2230      Scheduled Meds: . aspirin EC  81 mg Oral Daily  . clopidogrel  75 mg Oral Daily  . [START ON 08/25/2016] cyanocobalamin  1,000 mcg Intramuscular Q14 Days  . enoxaparin (LOVENOX) injection  40 mg Subcutaneous Q24H  . insulin aspart  0-24 Units Subcutaneous TID WC  . insulin detemir  10 Units  Subcutaneous QHS  . insulin regular  0-10 Units Intravenous TID WC  . levofloxacin (LEVAQUIN) IV  750 mg Intravenous Q24H  . mouth rinse  15 mL Mouth Rinse BID  . metoprolol succinate  12.5 mg Oral Daily  . [START ON 08/20/2016] pneumococcal 23 valent vaccine  0.5 mL Intramuscular Tomorrow-1000  . rosuvastatin  20 mg Oral Daily  . sodium chloride flush  3 mL Intravenous Q12H   Continuous Infusions: . sodium chloride Stopped (08/18/16 1951)   PRN Meds:.acetaminophen **OR** acetaminophen, benzonatate, chlorpheniramine-HYDROcodone, dextrose, levalbuterol   PHYSICAL EXAM: Vital signs: Vitals:   08/18/16 0943 08/18/16 1917 08/18/16 2346 08/19/16 0335  BP: 107/60 (!) 139/97 122/73 125/78  Pulse: 76 75 65 66  Resp: 17 (!) 22 (!) 21 17  Temp: 97.7 F (36.5 C) 97.3 F (36.3 C) 97.5 F (36.4 C) 97 F (36.1 C)  TempSrc: Oral Oral Oral Oral  SpO2: 92% 97% 96% 97%  Weight:      Height:       Filed Weights   08/18/16 0126  Weight: 106.3 kg (234 lb 5.6 oz)   Body mass index is 30.92 kg/m.   General appearance :Awake, alert, not in any distress. Speech Clear. Not toxic Looking HEENT: Atraumatic and Normocephalic Neck: supple, no JVD. No cervical lymphadenopathy. No thyromegaly Resp: Reduced air entry bilaterally, no added sounds  CVS: S1 S2 regular, no murmurs.  GI: Bowel sounds present, Non tender and not distended with no gaurding, rigidity or rebound.No organomegaly Extremities: B/L Lower Ext shows no edema, both legs are warm to touch Neurology:  speech clear,Non focal, sensation is grossly intact. Psychiatric: Normal judgment and insight. Alert and oriented x 3. Normal mood. Musculoskeletal:gait appears to be normal.No digital cyanosis Skin:No Rash, warm and dry Wounds:N/A  I have personally reviewed following labs and imaging studies  LABORATORY DATA: CBC:  Recent Labs Lab 08/17/16 2055 08/18/16 0522 08/19/16 0147  WBC 9.2 10.0 17.6*  NEUTROABS 7.4  --   --   HGB  16.5 16.4 16.1  HCT 47.7 48.7 46.7  MCV 89.2 91.4 90.3  PLT 109* 110* 121*    Basic Metabolic Panel:  Recent Labs Lab 08/17/16 2055 08/18/16 0522 08/18/16 1150 08/19/16 0147  NA 136 135 135 141  K 3.7 3.7 3.5 4.1  CL 101 101 103 109  CO2 26 23 23 24   GLUCOSE 214* 417* 515* 153*  BUN 18 22* 29* 32*  CREATININE 1.35* 1.59* 1.58* 1.24  CALCIUM 8.9 9.0 8.8* 9.0    GFR: Estimated Creatinine Clearance: 78.1 mL/min (by C-G formula based on SCr of 1.24 mg/dL).  Liver Function Tests:  Recent Labs Lab 08/17/16 2055 08/18/16 0522  AST 37 38  ALT 28 32  ALKPHOS 58 61  BILITOT 1.9* 1.4*  PROT 7.3 7.2  ALBUMIN 3.8 3.5   No results for input(s): LIPASE, AMYLASE in the last 168 hours. No results for input(s): AMMONIA in the last 168 hours.  Coagulation Profile: No results for input(s): INR, PROTIME in the last 168 hours.  Cardiac Enzymes: No results for input(s): CKTOTAL, CKMB, CKMBINDEX, TROPONINI in  the last 168 hours.  BNP (last 3 results) No results for input(s): PROBNP in the last 8760 hours.  HbA1C:  Recent Labs  08/18/16 0845  HGBA1C 7.4*    CBG:  Recent Labs Lab 08/18/16 2340 08/19/16 0046 08/19/16 0152 08/19/16 0304 08/19/16 0420  GLUCAP 118* 135* 146* 176* 181*    Lipid Profile: No results for input(s): CHOL, HDL, LDLCALC, TRIG, CHOLHDL, LDLDIRECT in the last 72 hours.  Thyroid Function Tests: No results for input(s): TSH, T4TOTAL, FREET4, T3FREE, THYROIDAB in the last 72 hours.  Anemia Panel: No results for input(s): VITAMINB12, FOLATE, FERRITIN, TIBC, IRON, RETICCTPCT in the last 72 hours.  Urine analysis: No results found for: COLORURINE, APPEARANCEUR, LABSPEC, PHURINE, GLUCOSEU, HGBUR, BILIRUBINUR, KETONESUR, PROTEINUR, UROBILINOGEN, NITRITE, LEUKOCYTESUR  Sepsis Labs: Lactic Acid, Venous    Component Value Date/Time   LATICACIDVEN 0.91 08/17/2016 2107    MICROBIOLOGY: Recent Results (from the past 240 hour(s))  Blood culture  (routine x 2)     Status: None (Preliminary result)   Collection Time: 08/17/16  9:15 PM  Result Value Ref Range Status   Specimen Description BLOOD LEFT HAND  Final   Special Requests   Final    BOTTLES DRAWN AEROBIC AND ANAEROBIC AER 5cc ANA 5cc   Culture  Setup Time   Final    GRAM POSITIVE COCCI IN CHAINS AEROBIC BOTTLE ONLY CRITICAL RESULT CALLED TO, READ BACK BY AND VERIFIED WITH: Gypsy Balsam AT 0710 08/19/16 BY L BENFIELD Performed at [facility]  Final   Report Status PENDING  Incomplete  Blood Culture ID Panel (Reflexed)     Status: Abnormal   Collection Time: 08/17/16  9:15 PM  Result Value Ref Range Status   Enterococcus species NOT DETECTED NOT DETECTED Final   Listeria monocytogenes NOT DETECTED NOT DETECTED Final   Staphylococcus species NOT DETECTED NOT DETECTED Final   Staphylococcus aureus NOT DETECTED NOT DETECTED Final   Streptococcus species DETECTED (A) NOT DETECTED Final    Comment: CRITICAL RESULT CALLED TO, READ BACK BY AND VERIFIED WITH: M SHUDA,PHARMD AT 0710 08/19/16 BY L BENFIELD    Streptococcus agalactiae NOT DETECTED NOT DETECTED Final   Streptococcus pneumoniae NOT DETECTED NOT DETECTED Final   Streptococcus pyogenes NOT DETECTED NOT DETECTED Final   Acinetobacter baumannii NOT DETECTED NOT DETECTED Final   Enterobacteriaceae species NOT DETECTED NOT DETECTED Final   Enterobacter cloacae complex NOT DETECTED NOT DETECTED Final   Escherichia coli NOT DETECTED NOT DETECTED Final   Klebsiella oxytoca NOT DETECTED NOT DETECTED Final   Klebsiella pneumoniae NOT DETECTED NOT DETECTED Final   Proteus species NOT DETECTED NOT DETECTED Final   Serratia marcescens NOT DETECTED NOT DETECTED Final   Haemophilus influenzae NOT DETECTED NOT DETECTED Final   Neisseria meningitidis NOT DETECTED NOT DETECTED Final   Pseudomonas aeruginosa NOT DETECTED NOT DETECTED Final   Candida albicans NOT DETECTED NOT DETECTED Final    Candida glabrata NOT DETECTED NOT DETECTED Final   Candida krusei NOT DETECTED NOT DETECTED Final   Candida parapsilosis NOT DETECTED NOT DETECTED Final   Candida tropicalis NOT DETECTED NOT DETECTED Final    Comment: Performed at [facility]  MRSA PCR Screening     Status: None   Collection Time: 08/18/16  5:16 PM  Result Value Ref Range Status   MRSA by PCR NEGATIVE NEGATIVE Final    Comment:        The GeneXpert MRSA Assay (FDA approved  for NASAL specimens only), is one component of a comprehensive MRSA colonization surveillance program. It is not intended to diagnose MRSA infection nor to guide or monitor treatment for MRSA infections.     RADIOLOGY STUDIES/RESULTS: Dg Chest 2 View  Result Date: 08/17/2016 CLINICAL DATA:  65 year old with 4 day history of cough and chest congestion. Former smoker. Current history of coronary artery disease post coronary stenting. EXAM: CHEST  2 VIEW COMPARISON:  02/09/2011, 10/20/2009, 08/23/2008. FINDINGS: Suboptimal inspiration. Taking this into account, cardiac silhouette upper normal in size, unchanged. Hilar and mediastinal contours otherwise unremarkable. Airspace consolidation involving the left lower lobe. Lungs otherwise clear. Pulmonary vascularity normal. No pleural effusions. Visualized bony thorax intact. IMPRESSION: Suboptimal inspiration.  Acute left lower lobe pneumonia. Electronically Signed   By: Evangeline Dakin M.D.   On: 08/17/2016 22:17     LOS: 2 days   Mir Marry Guan, MD  [facility] Pager:[phone]  If 7PM-7AM, please contact night-coverage [email or web address] Password [facility] 08/19/2016, 7:37 AM

## 2016-08-19 NOTE — Progress Notes (Signed)
PHARMACY - PHYSICIAN COMMUNICATION CRITICAL VALUE ALERT - BLOOD CULTURE IDENTIFICATION (BCID)  Results for orders placed or performed during the hospital encounter of 08/17/16  Blood Culture ID Panel (Reflexed) (Collected: 08/17/2016  9:15 PM)  Result Value Ref Range   Enterococcus species NOT DETECTED NOT DETECTED   Listeria monocytogenes NOT DETECTED NOT DETECTED   Staphylococcus species NOT DETECTED NOT DETECTED   Staphylococcus aureus NOT DETECTED NOT DETECTED   Streptococcus species DETECTED (A) NOT DETECTED   Streptococcus agalactiae NOT DETECTED NOT DETECTED   Streptococcus pneumoniae NOT DETECTED NOT DETECTED   Streptococcus pyogenes NOT DETECTED NOT DETECTED   Acinetobacter baumannii NOT DETECTED NOT DETECTED   Enterobacteriaceae species NOT DETECTED NOT DETECTED   Enterobacter cloacae complex NOT DETECTED NOT DETECTED   Escherichia coli NOT DETECTED NOT DETECTED   Klebsiella oxytoca NOT DETECTED NOT DETECTED   Klebsiella pneumoniae NOT DETECTED NOT DETECTED   Proteus species NOT DETECTED NOT DETECTED   Serratia marcescens NOT DETECTED NOT DETECTED   Haemophilus influenzae NOT DETECTED NOT DETECTED   Neisseria meningitidis NOT DETECTED NOT DETECTED   Pseudomonas aeruginosa NOT DETECTED NOT DETECTED   Candida albicans NOT DETECTED NOT DETECTED   Candida glabrata NOT DETECTED NOT DETECTED   Candida krusei NOT DETECTED NOT DETECTED   Candida parapsilosis NOT DETECTED NOT DETECTED   Candida tropicalis NOT DETECTED NOT DETECTED    Name of physician (or Provider) Contacted: Dr. Hollice Gong  Changes to prescribed antibiotics required:  Changing levofloxacin to ceftriaxone and azithromycin to cover for CAP per MD  Delane Ginger 08/19/2016  7:13 AM

## 2016-08-20 LAB — BASIC METABOLIC PANEL
Anion gap: 6 (ref 5–15)
BUN: 30 mg/dL — AB (ref 6–20)
CO2: 26 mmol/L (ref 22–32)
CREATININE: 1.17 mg/dL (ref 0.61–1.24)
Calcium: 8.6 mg/dL — ABNORMAL LOW (ref 8.9–10.3)
Chloride: 112 mmol/L — ABNORMAL HIGH (ref 101–111)
GFR calc Af Amer: >60 mL/min (ref >60)
GLUCOSE: 159 mg/dL — AB (ref 65–99)
POTASSIUM: 3.9 mmol/L (ref 3.5–5.1)
SODIUM: 144 mmol/L (ref 135–145)

## 2016-08-20 LAB — CBC
HEMATOCRIT: 45.9 % (ref 39.0–52.0)
Hemoglobin: 15.3 g/dL (ref 13.0–17.0)
MCH: 30.9 pg (ref 26.0–34.0)
MCHC: 33.3 g/dL (ref 30.0–36.0)
MCV: 92.7 fL (ref 78.0–100.0)
PLATELETS: 130 10*3/uL — AB (ref 150–400)
RBC: 4.95 MIL/uL (ref 4.22–5.81)
RDW: 13 % (ref 11.5–15.5)
WBC: 13.9 10*3/uL — AB (ref 4.0–10.5)

## 2016-08-20 LAB — GLUCOSE, CAPILLARY
Glucose-Capillary: 160 mg/dL — ABNORMAL HIGH (ref 65–99)
Glucose-Capillary: 130 mg/dL — ABNORMAL HIGH (ref 65–99)
Glucose-Capillary: 152 mg/dL — ABNORMAL HIGH (ref 65–99)

## 2016-08-20 MED ORDER — INSULIN GLARGINE 100 UNIT/ML ~~LOC~~ SOLN: 18.0000 [IU] | Freq: Every day | SUBCUTANEOUS | 0 refills | Status: DC

## 2016-08-20 MED ORDER — CEFDINIR 300 MG PO CAPS: 300.0000 mg | ORAL_CAPSULE | Freq: Two times a day (BID) | ORAL | 0 refills | Status: DC

## 2016-08-20 MED ORDER — CEFDINIR 300 MG PO CAPS: 300.0000 mg | ORAL_CAPSULE | Freq: Two times a day (BID) | ORAL | 0 refills | Status: AC

## 2016-08-20 MED ORDER — AZITHROMYCIN 500 MG PO TABS: 500.0000 mg | ORAL_TABLET | Freq: Every day | ORAL | 0 refills | Status: AC

## 2016-08-20 MED ORDER — BENZONATATE 100 MG PO CAPS: 100.0000 mg | ORAL_CAPSULE | Freq: Three times a day (TID) | ORAL | 0 refills | Status: DC | PRN

## 2016-08-20 MED ORDER — INSULIN GLARGINE 100 UNIT/ML ~~LOC~~ SOLN
18.0000 [IU] | Freq: Every day | SUBCUTANEOUS | 11 refills | Status: DC
Start: 2016-08-20 — End: 2016-08-20

## 2016-08-20 MED ORDER — INSULIN DETEMIR 100 UNIT/ML ~~LOC~~ SOLN
22.0000 [IU] | Freq: Every day | SUBCUTANEOUS | Status: DC
  Filled 2016-08-20: qty 0.22

## 2016-08-20 NOTE — Discharge Summary (Signed)
Discharge Summary  [name] [name] [id number] DOB: 03/28/1951  PCP: Pcp Not In System  Admit date: 08/17/2016 Discharge date: 08/20/2016   Recommendations for Outpatient Follow-up:  1. PCP 1 week. Needs to follow up inpatient blood culture. If consistent with contamination, d/c all antibiotics on 10/5.   Discharge Diagnoses:  Active Hospital Problems   Diagnosis Date Noted  . CAP (community acquired pneumonia) 08/17/2016  . Type II diabetes mellitus with manifestations ([place]) 08/18/2016  . HYPERTENSION, BENIGN 06/15/2009    Resolved Hospital Problems   Diagnosis Date Noted Date Resolved  No resolved problems to display.    Discharge Condition: Stable   Diet recommendation: Diabetic Diet  Vitals:   08/20/16 0800 08/20/16 1000  BP: (!) 146/88 126/70  Pulse: 71 (!) 55  Resp:    Temp: 97.6 F (36.4 C)     History of present illness and Hospital Course:  Patient is a 65 y.o. male with history of type 2 diabetes, hypertension admitted with several days history of cold like symptoms, cough and slight dyspnea. Found to have community acquired pneumonia and admitted to the hospitalist service for further evaluation and treatment. He was transferred to step down 9/29 for glucommander after getting IV steroids in the ER, but sugars are better now and off the insulin drip. Concern for strep species in 1/2 bottles of blood culture 9/30 so Levaquin changed to Rocephin/Azithromycin. He has been afebrile and requests discharge home, he is on room air. Plan dc home today since I feel blood culture is likely contaminant. Home on Azithro/Cefdinir and he was instructed to follow closely with PCP for blood cx results.  Community-acquired pneumonia: Clinically improved-less cough and fever curve better- leukocytosis due to steroids. Change to Azithro/Rocephin due to pos blood culture.  Bacteremia Clinically he looks good, this is likely contaminant. Follow [facility] results, repeat culture x2 was  obtained 9/30 as well. As patient is looking very good today, will discharge home. Prescription given for total 14 days of antibiotics in case of true bacteremia, and patient knows to f/u with PCP to follow up blood cultures. Also discussed symptoms such as fever, chills for which to seek medical care or return to ER.  Uncontrolled type 2 diabetes: Claims CBGs were at home are controlled with just 15 units of Lantus and glipizide-CBGs are currently uncontrolled (given 125 mg of Solu-Medrol in the emergency room). Now his sugars are better, on baseline Levemir 10 units qHS and SSI. Steroids were discontinued rapidly as he has had no wheezing or other indication for steroids.  Acute kidney injury: Suspect hemodynamically mediated-secondary to pneumonia and uncontrolled diabetes.Continue to hold the Micardis due to hypotension. Lasix was held initially but since renal function improved can restart Lasix PO at home.   Hypertension: Blood pressure normal, but will continue to hold Micardis at home, continue beta blocker.  History of CAD: Currently chest pain-free-continue aspirin/Plavix/beta blocker/statin.  Thrombocytopenia: Mild-seems to be a chronic issue-follow for now  Procedures:  None   Consultations:  None   Discharge Exam: BP 126/70   Pulse (!) 55   Temp 97.6 F (36.4 C) (Oral)   Resp 18   Ht 6\' 1"  (1.854 m)   Wt 106.3 kg (234 lb 5.6 oz)   SpO2 96%   BMI 30.92 kg/m  General:  Alert, oriented, calm, in no acute distress  Eyes: EOMI, clear sclerea Neck: supple, no masses, trachea mildline  Cardiovascular: RRR, no murmurs or rubs, no peripheral edema  Respiratory: clear to auscultation  bilaterally, no wheezes, no crackles  Abdomen: soft, nontender, nondistended, normal bowel tones heard  Skin: dry, no rashes  Musculoskeletal: no joint effusions, normal range of motion  Psychiatric: appropriate affect, normal speech  Neurologic: extraocular muscles intact, clear speech,  moving all extremities with intact sensorium    Discharge Instructions You were cared for by a hospitalist during your hospital stay. If you have any questions about your discharge medications or the care you received while you were in the hospital after you are discharged, you can call the unit and asked to speak with the hospitalist on call if the hospitalist that took care of you is not available. Once you are discharged, your primary care physician will handle any further medical issues. Please note that NO REFILLS for any discharge medications will be authorized once you are discharged, as it is imperative that you return to your primary care physician (or establish a relationship with a primary care physician if you do not have one) for your aftercare needs so that they can reassess your need for medications and monitor your lab values.  Discharge Instructions    Call MD for:  difficulty breathing, headache or visual disturbances    Complete by:  As directed    Call MD for:  persistant dizziness or light-headedness    Complete by:  As directed    Call MD for:  temperature >100.4    Complete by:  As directed    Diet - low sodium heart healthy    Complete by:  As directed    Increase activity slowly    Complete by:  As directed        Medication List    STOP taking these medications   amLODipine 10 MG tablet Commonly known as:  NORVASC   telmisartan 80 MG tablet Commonly known as:  MICARDIS     TAKE these medications   aspirin 81 MG tablet Take 81 mg by mouth daily.   azithromycin 500 MG tablet Commonly known as:  ZITHROMAX Take 1 tablet (500 mg total) by mouth daily.   benzonatate 100 MG capsule Commonly known as:  TESSALON Take 1 capsule (100 mg total) by mouth 3 (three) times daily as needed for cough.   cefdinir 300 MG capsule Commonly known as:  OMNICEF Take 1 capsule (300 mg total) by mouth 2 (two) times daily.   clopidogrel 75 MG tablet Commonly known as:   PLAVIX Take 1 tablet (75 mg total) by mouth daily.   cyanocobalamin 1000 MCG/ML injection Commonly known as:  (VITAMIN B-12) Inject 1,000 mcg into the muscle every 14 (fourteen) days.   furosemide 20 MG tablet Commonly known as:  LASIX Take 20 mg by mouth as needed for fluid.   glipiZIDE 10 MG tablet Commonly known as:  GLUCOTROL Take 10 mg by mouth daily.   insulin glargine 100 UNIT/ML injection Commonly known as:  LANTUS Inject 0.18 mLs (18 Units total) into the skin at bedtime. What changed:  how much to take   levalbuterol 45 MCG/ACT inhaler Commonly known as:  XOPENEX HFA Inhale into the lungs every 4 (four) hours as needed for wheezing.   metoprolol succinate 25 MG 24 hr tablet Commonly known as:  TOPROL-XL Take 12.5 mg by mouth daily.   rosuvastatin 20 MG tablet Commonly known as:  CRESTOR Take 20 mg by mouth daily.      Allergies  Allergen Reactions  . Fish Oil   . Penicillins   . Ramipril Cough  .  Amoxicillin Rash   Follow-up Information    BRYAN,DANIEL J, PA-C Follow up in 1 week(s).   Specialty:  Family Medicine Why:  Please contact [facility] for final results of blood cultures (likely contaminant). Contact information: [address] [place] [address] [phone]            The results of significant diagnostics from this hospitalization (including imaging, microbiology, ancillary and laboratory) are listed below for reference.    Significant Diagnostic Studies: Dg Chest 2 View  Result Date: 08/17/2016 CLINICAL DATA:  65 year old with 4 day history of cough and chest congestion. Former smoker. Current history of coronary artery disease post coronary stenting. EXAM: CHEST  2 VIEW COMPARISON:  02/09/2011, 10/20/2009, 08/23/2008. FINDINGS: Suboptimal inspiration. Taking this into account, cardiac silhouette upper normal in size, unchanged. Hilar and mediastinal contours otherwise unremarkable. Airspace consolidation  involving the left lower lobe. Lungs otherwise clear. Pulmonary vascularity normal. No pleural effusions. Visualized bony thorax intact. IMPRESSION: Suboptimal inspiration.  Acute left lower lobe pneumonia. Electronically Signed   By: Evangeline Dakin M.D.   On: 08/17/2016 22:17    Microbiology: Recent Results (from the past 240 hour(s))  Blood culture (routine x 2)     Status: None (Preliminary result)   Collection Time: 08/17/16  9:00 PM  Result Value Ref Range Status   Specimen Description BLOOD LEFT ANTECUBITAL  Final   Special Requests   Final    BOTTLES DRAWN AEROBIC AND ANAEROBIC AER 5cc ANA 5cc   Culture   Final    NO GROWTH 1 DAY Performed at [facility]    Report Status PENDING  Incomplete  Blood culture (routine x 2)     Status: Abnormal (Preliminary result)   Collection Time: 08/17/16  9:15 PM  Result Value Ref Range Status   Specimen Description BLOOD LEFT HAND  Final   Special Requests   Final    BOTTLES DRAWN AEROBIC AND ANAEROBIC AER 5cc ANA 5cc   Culture  Setup Time   Final    GRAM POSITIVE COCCI IN CHAINS AEROBIC BOTTLE ONLY CRITICAL RESULT CALLED TO, READ BACK BY AND VERIFIED WITH: M SHUDA,PHARMD AT 0710 08/19/16 BY L BENFIELD    Culture (A)  Final    STREPTOCOCCUS SPECIES IDENTIFICATION TO FOLLOW Performed at [facility]    Report Status PENDING  Incomplete  Blood Culture ID Panel (Reflexed)     Status: Abnormal   Collection Time: 08/17/16  9:15 PM  Result Value Ref Range Status   Enterococcus species NOT DETECTED NOT DETECTED Final   Listeria monocytogenes NOT DETECTED NOT DETECTED Final   Staphylococcus species NOT DETECTED NOT DETECTED Final   Staphylococcus aureus NOT DETECTED NOT DETECTED Final   Streptococcus species DETECTED (A) NOT DETECTED Final    Comment: CRITICAL RESULT CALLED TO, READ BACK BY AND VERIFIED WITH: M SHUDA,PHARMD AT 0710 08/19/16 BY L BENFIELD    Streptococcus agalactiae NOT DETECTED NOT DETECTED Final    Streptococcus pneumoniae NOT DETECTED NOT DETECTED Final   Streptococcus pyogenes NOT DETECTED NOT DETECTED Final   Acinetobacter baumannii NOT DETECTED NOT DETECTED Final   Enterobacteriaceae species NOT DETECTED NOT DETECTED Final   Enterobacter cloacae complex NOT DETECTED NOT DETECTED Final   Escherichia coli NOT DETECTED NOT DETECTED Final   Klebsiella oxytoca NOT DETECTED NOT DETECTED Final   Klebsiella pneumoniae NOT DETECTED NOT DETECTED Final   Proteus species NOT DETECTED NOT DETECTED Final   Serratia marcescens NOT DETECTED NOT DETECTED  Final   Haemophilus influenzae NOT DETECTED NOT DETECTED Final   Neisseria meningitidis NOT DETECTED NOT DETECTED Final   Pseudomonas aeruginosa NOT DETECTED NOT DETECTED Final   Candida albicans NOT DETECTED NOT DETECTED Final   Candida glabrata NOT DETECTED NOT DETECTED Final   Candida krusei NOT DETECTED NOT DETECTED Final   Candida parapsilosis NOT DETECTED NOT DETECTED Final   Candida tropicalis NOT DETECTED NOT DETECTED Final    Comment: Performed at [facility]  MRSA PCR Screening     Status: None   Collection Time: 08/18/16  5:16 PM  Result Value Ref Range Status   MRSA by PCR NEGATIVE NEGATIVE Final    Comment:        The GeneXpert MRSA Assay (FDA approved for NASAL specimens only), is one component of a comprehensive MRSA colonization surveillance program. It is not intended to diagnose MRSA infection nor to guide or monitor treatment for MRSA infections.      Labs: Basic Metabolic Panel:  Recent Labs Lab 08/17/16 2055 08/18/16 0522 08/18/16 1150 08/19/16 0147 08/20/16 0306  NA 136 135 135 141 144  K 3.7 3.7 3.5 4.1 3.9  CL 101 101 103 109 112*  CO2 26 23 23 24 26   GLUCOSE 214* 417* 515* 153* 159*  BUN 18 22* 29* 32* 30*  CREATININE 1.35* 1.59* 1.58* 1.24 1.17  CALCIUM 8.9 9.0 8.8* 9.0 8.6*   Liver Function Tests:  Recent Labs Lab 08/17/16 2055 08/18/16 0522  AST 37 38  ALT 28 32  ALKPHOS 58  61  BILITOT 1.9* 1.4*  PROT 7.3 7.2  ALBUMIN 3.8 3.5   No results for input(s): LIPASE, AMYLASE in the last 168 hours. No results for input(s): AMMONIA in the last 168 hours. CBC:  Recent Labs Lab 08/17/16 2055 08/18/16 0522 08/19/16 0147 08/20/16 0306  WBC 9.2 10.0 17.6* 13.9*  NEUTROABS 7.4  --   --   --   HGB 16.5 16.4 16.1 15.3  HCT 47.7 48.7 46.7 45.9  MCV 89.2 91.4 90.3 92.7  PLT 109* 110* 121* 130*   Cardiac Enzymes: No results for input(s): CKTOTAL, CKMB, CKMBINDEX, TROPONINI in the last 168 hours. BNP: BNP (last 3 results) No results for input(s): BNP in the last 8760 hours.  ProBNP (last 3 results) No results for input(s): PROBNP in the last 8760 hours.  CBG:  Recent Labs Lab 08/19/16 1343 08/19/16 1654 08/19/16 2111 08/20/16 0311 08/20/16 0808  GLUCAP 219* 340* 206* 160* 130*    Time spent: 32 minutes were spent in preparing this discharge including medication reconciliation, counseling, and coordination of care.  Signed:  Salvatrice Morandi [employer]  [facility] 08/20/2016, 12:11 PM

## 2016-08-21 LAB — CULTURE, BLOOD (ROUTINE X 2)

## 2016-08-23 LAB — CULTURE, BLOOD (ROUTINE X 2): Culture: NO GROWTH

## 2016-08-24 LAB — CULTURE, BLOOD (ROUTINE X 2)
CULTURE: NO GROWTH
CULTURE: NO GROWTH

## 2017-03-05 ENCOUNTER — Encounter: Payer: Self-pay | Admitting: Sports Medicine

## 2017-03-05 ENCOUNTER — Ambulatory Visit (INDEPENDENT_AMBULATORY_CARE_PROVIDER_SITE_OTHER): Payer: BLUE CROSS/BLUE SHIELD | Admitting: Sports Medicine

## 2017-03-05 ENCOUNTER — Ambulatory Visit: Payer: Self-pay

## 2017-03-05 VITALS — BP 160/84 | HR 82 | Ht 73.0 in | Wt 228.4 lb

## 2017-03-05 DIAGNOSIS — Z794 Long term (current) use of insulin: Secondary | ICD-10-CM | POA: Diagnosis not present

## 2017-03-05 DIAGNOSIS — I10 Essential (primary) hypertension: Secondary | ICD-10-CM

## 2017-03-05 DIAGNOSIS — M25461 Effusion, right knee: Secondary | ICD-10-CM

## 2017-03-05 DIAGNOSIS — M170 Bilateral primary osteoarthritis of knee: Secondary | ICD-10-CM | POA: Insufficient documentation

## 2017-03-05 DIAGNOSIS — E118 Type 2 diabetes mellitus with unspecified complications: Secondary | ICD-10-CM

## 2017-03-05 DIAGNOSIS — M25561 Pain in right knee: Secondary | ICD-10-CM | POA: Diagnosis not present

## 2017-03-05 HISTORY — DX: Bilateral primary osteoarthritis of knee: M17.0

## 2017-03-05 NOTE — Assessment & Plan Note (Signed)
Has been doing quite well.  Will need to be cautious with continued steroid use

## 2017-03-05 NOTE — Assessment & Plan Note (Signed)
Blood pressure has been elevated over the past several days since using ibuprofen.  He reports good control at home on a regular basis although the lower extremity swelling has been problematic for him.  He will plan to follow-up with his PCP to further discuss both of these issues.

## 2017-03-05 NOTE — Assessment & Plan Note (Signed)
Aspiration injection of the right knee.  We will get him preapproved him for Visco supplementation.  He did have an extruded meniscus on MSK ultrasound along the medial joint line.  If any lack of improvement MRI of the right knee versus surgical consultation but his degenerative changes quite advanced and he would likely require total knee arthroplasty if surgical intervention was pursued.  He would like to avoid surgery at this time.  Continue with bracing and strengthening as previously prescribed.  We will preapproved him for Visco supplementation with Synvisc series.

## 2017-03-05 NOTE — Progress Notes (Signed)
OFFICE VISIT NOTE [name] [name]. [name] [name], [name] [name] at [facility] [phone]  RIOT WATERWORTH - 66 y.o. male MRN [id number]  Date of birth: [birth year]  Visit Date: 03/05/2017  PCP: Pcp Not In System   Referred by: No ref. provider found  SUBJECTIVE:   Chief Complaint  Patient presents with  . pain in right knee    Pt c/o right knee pain that started Friday. The knee is tender to palpation. The pain in mostly medial. He has tried Advil, Naproxen, Tylenol, and Icy Hot with not relief. Pain is currently 7/10. The pain is constant but is worse when bending the knee.    HPI: As above. Additional pertinent information includes:  Symptoms as above.  Prior patient of mine at [facility].  Has done well with last injections including cortisone and Visco supplementation with Synvisc 1 up until Friday.  Ibuprofen has been mildly helpful but still having mainly medial sided pain.  Denies any catching, locking or giving way.  Summary swelling has been present for quite some time due to amlodipine use.  Is trying to reestablish with his prior primary doctor who is now doing [facility]    ROS: ROS  Otherwise per HPI.  HISTORY & PERTINENT PRIOR DATA:  No specialty comments available. He reports that he has quit smoking. He has a 5.00 pack-year smoking history. He has never used smokeless tobacco.   Recent Labs  08/18/16 0845  HGBA1C 7.4*   Medications & Allergies reviewed per EMR Patient Active Problem List   Diagnosis Date Noted  . Primary osteoarthritis of both knees 03/05/2017  . Type II diabetes mellitus with manifestations ([place]) 08/18/2016  . CAP (community acquired pneumonia) 08/17/2016  . Right carotid bruit 08/05/2014  . HYPERLIPIDEMIA-MIXED 06/15/2009  . HYPERTENSION, BENIGN 06/15/2009  . CAD, NATIVE VESSEL 06/15/2009   Past Medical History:  Diagnosis Date  . CAD (coronary artery disease)    nonST elevated MI in Oct 2008 treated  w/2 drug -eluting stents to the RCA and  mid LAD, EF 55%  . DM (diabetes mellitus) ([place])   . HTN (hypertension)   . Hyperlipidemia    Family History  Problem Relation Age of Onset  . Hypertension Mother   . Cancer Mother   . Heart attack Father   . Hypertension Father   . Diabetes Father   . Hypertension Brother   . Irregular heart beat Brother    Past Surgical History:  Procedure Laterality Date  . CORONARY ANGIOPLASTY WITH STENT PLACEMENT    . LAPAROSCOPIC INGUINAL HERNIA REPAIR     Social History   Occupational History  . Not on file.   Social History Main Topics  . Smoking status: Former Smoker    Packs/day: 0.50    Years: 10.00  . Smokeless tobacco: Never Used  . Alcohol use No  . Drug use: No  . Sexual activity: Not on file    OBJECTIVE:  VS:  HT:6\' 1"  (185.4 cm)   WT:228 lb 6.4 oz (103.6 kg)  BMI:30.2    BP:(!) 160/84  HR:82bpm  TEMP: ( )  RESP:96 % Physical Exam  Constitutional: He appears well-developed and well-nourished. He is cooperative.  Non-toxic appearance.  HENT:  Head: Normocephalic and atraumatic.  Cardiovascular: Intact distal pulses.   Pulmonary/Chest: No accessory muscle usage. No respiratory distress.  Musculoskeletal: He exhibits edema (2+ pretibial).  Neurological: He is alert. He is not disoriented. He displays normal reflexes. No sensory  deficit.  Skin: Skin is warm, dry and intact. Capillary refill takes less than 2 seconds. No abrasion and no rash noted.  Psychiatric: He has a normal mood and affect. His speech is normal and behavior is normal. Thought content normal.   Right Knee:  No significant rashes/lesions/ulcerations overlying the legs.  No significant bruising or scarring.  LE Sensation intact to light touch.  Overall joint is well aligned, no significant deformity.    Moderate effusion.    ROM: 5 to 95.    Extensor mechanism intact  Moderate medial joint line tenderness..    Stable to varus/valgus strain &  anterior/posterior drawer.  Normal Lachman's.    Pain with McMurray's and Thessaly.  There is a palpable click on the medial joint line with flexion and extension.      IMAGING & PROCEDURES: No results found. No additional findings.   PROCEDURE NOTE: ULTRASOUND GUIDED RIGHT KNEE ASPIRATION &INJECTION  Images were obtained and interpreted by myself, Teresa Coombs, DO  Images have been saved and stored to PACS system. Images obtained on: GE S7 Ultrasound machine  DESCRIPTION OF PROCEDURE:  The patient's clinical condition is marked by substantial pain and/or significant functional disability. Other conservative therapy has not provided relief, is contraindicated, or not appropriate. There is a reasonable likelihood that injection will significantly improve the patient's pain and/or functional impairment. After discussing the risks, benefits and expected outcomes of the injection and all questions were reviewed and answered, the patient wished to undergo the above named procedure. Verbal consent was obtained. The ultrasound was used to identify the target structure and adjacent neurovascular structures. The skin was then prepped in sterile fashion and the target structure was injected under direct visualization using sterile technique as below: PREP: Alcohol, Ethel Chloride, 5cc 1% lidocaine on 21 needle APPROACH: Superiolateral,Stockcock technique, 21g 2" needle INJECTATE: 2cc 0.5% marcaine, 2cc 40mg  DepoMedrol ASPIRATE: 25cc slightly orange clear synovial fluid  DRESSING: Band-Aid and 6" Ace-Wrap  Post procedural instructions including recommending icing and warning signs for infection were reviewed. This procedure was well tolerated and there were no complications.   IMPRESSION: Succesful US Guided Aspiration & injection    ASSESSMENT & PLAN:  Visit Diagnoses:  1. Right knee pain, unspecified chronicity   2. Effusion of right knee   3. Primary osteoarthritis of both knees   4.  Type 2 diabetes mellitus with complication, with long-term current use of insulin ([place])   5. HYPERTENSION, BENIGN    Meds: No orders of the defined types were placed in this encounter.   Orders:  Orders Placed This Encounter  Procedures  . [place] LIMITED JOINT SPACE STRUCTURES LOW RIGHT(NO LINKED CHARGES)    Follow-up: No Follow-up on file.   Otherwise please see problem oriented charting as below.

## 2017-03-05 NOTE — Patient Instructions (Signed)
Keep using your compression. We are going get you pre-approved for the Synvisc 3 shot series to do in 10 weeks.  If you are not better in 2 weeks please give [place] a call!

## 2017-03-29 ENCOUNTER — Telehealth: Payer: Self-pay | Admitting: Sports Medicine

## 2017-03-29 NOTE — Telephone Encounter (Signed)
Patient asking about prior auth for knee injections he discussed with provider. Can you advise?

## 2017-03-29 NOTE — Telephone Encounter (Signed)
Spoke with patient and advised that Synvisc did not appear to be covered so we will check coverage for Monovisc and let him know when we hear back.

## 2017-04-03 NOTE — Telephone Encounter (Signed)
Monovisc is not covered under medical benefits but is covered under pharmacy benefits. Rx can be sent to [facility]. Pt should also contact [facility] regarding cost [phone].

## 2017-04-03 NOTE — Telephone Encounter (Signed)
Rx request received from [facility], completed, and faxed back.

## 2017-04-03 NOTE — Telephone Encounter (Signed)
Called [phone] and left VM for pt to call the office.

## 2017-04-03 NOTE — Telephone Encounter (Signed)
Spoke with patient and advised. He will contact [facility] and call back if he wants to proceed with the injection.

## 2017-04-27 ENCOUNTER — Ambulatory Visit: Payer: BLUE CROSS/BLUE SHIELD | Admitting: Sports Medicine

## 2017-05-04 ENCOUNTER — Encounter: Payer: Self-pay | Admitting: Sports Medicine

## 2017-05-04 ENCOUNTER — Ambulatory Visit (INDEPENDENT_AMBULATORY_CARE_PROVIDER_SITE_OTHER): Payer: BLUE CROSS/BLUE SHIELD | Admitting: Sports Medicine

## 2017-05-04 ENCOUNTER — Ambulatory Visit: Payer: Self-pay

## 2017-05-04 VITALS — BP 144/90 | HR 86 | Ht 73.0 in | Wt 232.8 lb

## 2017-05-04 DIAGNOSIS — R600 Localized edema: Secondary | ICD-10-CM | POA: Diagnosis not present

## 2017-05-04 DIAGNOSIS — M170 Bilateral primary osteoarthritis of knee: Secondary | ICD-10-CM | POA: Diagnosis not present

## 2017-05-04 DIAGNOSIS — I872 Venous insufficiency (chronic) (peripheral): Secondary | ICD-10-CM

## 2017-05-04 HISTORY — DX: Localized edema: R60.0

## 2017-05-04 NOTE — Progress Notes (Signed)
OFFICE VISIT NOTE [name] [name] at [facility] [phone]  [name] [name] - 66 y.o. male MRN [id number]  Date of birth: Jul 08, 1951  Visit Date: 05/04/2017  PCP: System, Pcp Not In   Referred by: No ref. provider found  Burlene Arnt, CMA acting as scribe for Dr. Paulla Fore.  SUBJECTIVE:   Chief Complaint  Patient presents with  . Follow-up    osteoarthritis of both knees.   HPI: As below and per problem based documentation when appropriate.  Pt presents today in follow-up of osteoarthritis of both knees. He had steroid injection 03/05/17. He takes Ibuprofen as needed for the pain. He has purchases Monovisc injections and had them shipped to the office and would like to receive these injections today.   Pt reports increased pain in both knees since receiving the steroid injection. His knees swell and swelling increases throughout the day. Pt reports that the steroid injection increased his BG but he was able to get it under control after 3-4 days.   Pt denies fever, chills, night sweats, unintentional weight gain or loss.     Review of Systems  Constitutional: Negative for chills and fever.  Respiratory: Negative for shortness of breath and wheezing.   Cardiovascular: Positive for leg swelling. Negative for chest pain and palpitations.  Musculoskeletal: Positive for joint pain. Negative for falls.  Neurological: Positive for tingling (fingers). Negative for dizziness and headaches.  Endo/Heme/Allergies: Bruises/bleeds easily.    Otherwise per HPI.  HISTORY & PERTINENT PRIOR DATA:  No specialty comments available. He reports that he has quit smoking. He has a 5.00 pack-year smoking history. He has never used smokeless tobacco.   Recent Labs  08/18/16 0845  HGBA1C 7.4*   Medications & Allergies reviewed per EMR Patient Active Problem List   Diagnosis Date Noted  . Bilateral leg edema 05/04/2017  . Primary  osteoarthritis of both knees 03/05/2017  . Type II diabetes mellitus with manifestations ([place]) 08/18/2016  . CAP (community acquired pneumonia) 08/17/2016  . Right carotid bruit 08/05/2014  . HYPERLIPIDEMIA-MIXED 06/15/2009  . HYPERTENSION, BENIGN 06/15/2009  . CAD, NATIVE VESSEL 06/15/2009   Past Medical History:  Diagnosis Date  . 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 DM (diabetes mellitus) ([place])   . HTN (hypertension)   . Hyperlipidemia    Family History  Problem Relation Age of Onset  . Hypertension Mother   . Cancer Mother   . Heart attack Father   . Hypertension Father   . Diabetes Father   . Hypertension Brother   . Irregular heart beat Brother    Past Surgical History:  Procedure Laterality Date  . CORONARY ANGIOPLASTY WITH STENT PLACEMENT    . LAPAROSCOPIC INGUINAL HERNIA REPAIR     Social History   Occupational History  . Not on file.   Social History Main Topics  . Smoking status: Former Smoker    Packs/day: 0.50    Years: 10.00  . Smokeless tobacco: Never Used  . Alcohol use No  . Drug use: No  . Sexual activity: Not on file    OBJECTIVE:  VS:  HT:6\' 1"  (185.4 cm)   WT:232 lb 12.8 oz (105.6 kg)  BMI:30.8    BP:(!) 144/90  HR:86bpm  TEMP: ( )  RESP:94 % EXAM: Findings:  Knees have generalized osteoarthritic bossing.  Large effusions.  Ligamentously has 3-4 mm of opening with valgus testing bilaterally with solid endpoint.  Anterior posterior drawer stable.  Range of motion is full and unrestricted.  2+ pitting edema bilaterally.  DP and PT pulses 2+/4.     No results found. ASSESSMENT & PLAN:   Problem List Items Addressed This Visit    Primary osteoarthritis of both knees - Primary    PROCEDURE NOTE -  ULTRASOUND GUIDEDINJECTION: Bilateral Knee Aspiration & Monovisc Inection  Images were obtained and interpreted by myself, Teresa Coombs, DO    Images have been saved and stored to PACS system. Images obtained on: GE S7 Ultrasound machine  ULTRASOUND FINDINGS: Large Effusions bilaterally with marked synovitis  DESCRIPTION OF PROCEDURE:  The patient's clinical condition is marked by substantial pain and/or significant functional disability. Other conservative therapy has not provided relief, is contraindicated, or not appropriate. There is a reasonable likelihood that injection will significantly improve the patient's pain and/or functional impairment. After discussing the risks, benefits and expected outcomes of the injection and all questions were reviewed and answered, the patient wished to undergo the above named procedure. Verbal consent was obtained. The ultrasound was used to identify the target structure and adjacent neurovascular structures. The skin was then prepped in sterile fashion and the target structure was injected under direct visualization using sterile technique as below:  Right Knee: PREP: Alcohol, Ethel Chloride, 3cc 1% lidocaine on 25 needle APPROACH: Superiolateral, sterile stopcock Technique, 18g 2 " needle INJECTATE: 88mg  Monovisc ([facility]) ASPIRATE: 25cc yellow serous fluid DRESSING: Band-Aid and 6" Ace-Wrap  Right Knee: PREP: Alcohol, Ethel Chloride, 3cc 1% lidocaine on 25 needle APPROACH: Superiolateral, sterile stopcock Technique, 18g 2 " needle INJECTATE: 88mg  Monovisc [facility]) ASPIRATE: 45cc Orange tinged thin fluid DRESSING: Band-Aid and 6" Ace-Wrap  Post procedural instructions including recommending icing and warning signs for infection were reviewed. This procedure was well tolerated and there were no complications.   IMPRESSION: Succesful US Guided Aspiration & Monovisc injection           Relevant Orders   US GUIDED NEEDLE PLACEMENT(NO LINKED CHARGES)   Synovial cell count + diff, w/ crystals (Completed)   Bilateral leg edema    Marked lower extremity edema  that is symmetric.  He has noticed that this is worse towards the end of the day.  He has tried over-the-counter compression socks as well as low-grade prescription that are quite uncomfortable for him.  We will try prescription grade 30-40 mm 2 layer sock from 5 wear.  Prescription provided today.  He should also continue with elevation at nighttime.  He will follow-up with his PCP for discussion further of continuing his diuretic as he has discontinued since I last saw him.  I do not favor him being on this we will leave this up to his PCP.       Other Visit Diagnoses    Venous insufficiency       Relevant Medications   amLODipine (NORVASC) 5 MG tablet      Follow-up: Return if symptoms worsen or fail to improve.  No future appointments.   CMA/ATC served as Education administrator during this visit. History, Physical, and Plan performed by medical provider. Documentation and orders reviewed and attested to.      Teresa Coombs, [place] [facility] Physician

## 2017-05-04 NOTE — Assessment & Plan Note (Signed)
PROCEDURE NOTE -  ULTRASOUND GUIDEDINJECTION: Bilateral Knee Aspiration & Monovisc Inection  Images were obtained and interpreted by myself, Teresa Coombs, DO  Images have been saved and stored to PACS system. Images obtained on: GE S7 Ultrasound machine  ULTRASOUND FINDINGS: Large Effusions bilaterally with marked synovitis  DESCRIPTION OF PROCEDURE:  The patient's clinical condition is marked by substantial pain and/or significant functional disability. Other conservative therapy has not provided relief, is contraindicated, or not appropriate. There is a reasonable likelihood that injection will significantly improve the patient's pain and/or functional impairment. After discussing the risks, benefits and expected outcomes of the injection and all questions were reviewed and answered, the patient wished to undergo the above named procedure. Verbal consent was obtained. The ultrasound was used to identify the target structure and adjacent neurovascular structures. The skin was then prepped in sterile fashion and the target structure was injected under direct visualization using sterile technique as below:  Right Knee: PREP: Alcohol, Ethel Chloride, 3cc 1% lidocaine on 25 needle APPROACH: Superiolateral, sterile stopcock Technique, 18g 2 " needle INJECTATE: 88mg  Monovisc ([facility]) ASPIRATE: 25cc yellow serous fluid DRESSING: Band-Aid and 6" Ace-Wrap  Right Knee: PREP: Alcohol, Ethel Chloride, 3cc 1% lidocaine on 25 needle APPROACH: Superiolateral, sterile stopcock Technique, 18g 2 " needle INJECTATE: 88mg  Monovisc ([facility]) ASPIRATE: 45cc Orange tinged thin fluid DRESSING: Band-Aid and 6" Ace-Wrap  Post procedural instructions including recommending icing and warning signs for infection were reviewed. This procedure was well tolerated and there were no complications.   IMPRESSION: Succesful US Guided Aspiration & Monovisc injection

## 2017-05-04 NOTE — Assessment & Plan Note (Signed)
Marked lower extremity edema that is symmetric.  He has noticed that this is worse towards the end of the day.  He has tried over-the-counter compression socks as well as low-grade prescription that are quite uncomfortable for him.  We will try prescription grade 30-40 mm 2 layer sock from 5 wear.  Prescription provided today.  He should also continue with elevation at nighttime.  He will follow-up with his PCP for discussion further of continuing his diuretic as he has discontinued since I last saw him.  I do not favor him being on this we will leave this up to his PCP.

## 2017-05-05 LAB — SYNOVIAL CELL COUNT + DIFF, W/ CRYSTALS
BASOPHILS, %: 0 %
EOSINOPHILS-SYNOVIAL: 0 % (ref 0–2)
LYMPHOCYTES-SYNOVIAL FLD: 28 % (ref 0–74)
MONOCYTE/MACROPHAGE: 60 % (ref 0–69)
NEUTROPHIL, SYNOVIAL: 12 % (ref 0–24)
Synoviocytes, %: 0 % (ref 0–15)
WBC, Synovial: 62 cells/uL (ref <150)

## 2017-05-22 ENCOUNTER — Other Ambulatory Visit: Payer: Self-pay | Admitting: Sports Medicine

## 2017-11-09 ENCOUNTER — Ambulatory Visit: Payer: Self-pay

## 2017-11-09 ENCOUNTER — Encounter: Payer: Self-pay | Admitting: Sports Medicine

## 2017-11-09 ENCOUNTER — Ambulatory Visit: Payer: BLUE CROSS/BLUE SHIELD | Admitting: Sports Medicine

## 2017-11-09 VITALS — BP 132/78 | HR 76 | Ht 73.0 in | Wt 224.6 lb

## 2017-11-09 DIAGNOSIS — Z794 Long term (current) use of insulin: Secondary | ICD-10-CM | POA: Diagnosis not present

## 2017-11-09 DIAGNOSIS — R600 Localized edema: Secondary | ICD-10-CM | POA: Diagnosis not present

## 2017-11-09 DIAGNOSIS — M25561 Pain in right knee: Secondary | ICD-10-CM

## 2017-11-09 DIAGNOSIS — M170 Bilateral primary osteoarthritis of knee: Secondary | ICD-10-CM

## 2017-11-09 DIAGNOSIS — G8929 Other chronic pain: Secondary | ICD-10-CM | POA: Diagnosis not present

## 2017-11-09 DIAGNOSIS — E118 Type 2 diabetes mellitus with unspecified complications: Secondary | ICD-10-CM

## 2017-11-09 DIAGNOSIS — M25562 Pain in left knee: Secondary | ICD-10-CM | POA: Diagnosis not present

## 2017-11-09 NOTE — Progress Notes (Signed)
[name], [place] at [facility] [phone]  CORY KITT - 66 y.o. male MRN [id number]  Date of birth: Feb 06, 1951  Visit Date: 11/09/2017  PCP: System, Pcp Not In   Referred by: No ref. provider found   Scribe for today's visit: Wendy Poet, ATC    SUBJECTIVE:  [name] [name] is here for Follow-up (B knee pain) .   Pt presents today in follow-up of osteoarthritis of both knees. He had steroid injection 03/05/17. He takes Ibuprofen as needed for the pain. He has purchases Monovisc injections and had them shipped to the office and would like to receive these injections today.   Pt reports increased pain in both knees since receiving the steroid injection. His knees swell and swelling increases throughout the day. Pt reports that the steroid injection increased his BG but he was able to get it under control after 3-4 days.   Pt denies fever, chills, night sweats, unintentional weight gain or loss.     Compared to the last office visit on 05/04/17, his previously described B knee pain symptoms are worsening with increased pain and swelling in both knees over the past month. Current symptoms are 4-5/10 pain at rest and 6-7/10 pain at it's worst & are nonradiating He has been taking Advil as needed w/ very limited relief.   ROS Denies night time disturbances. Denies fevers, chills, or night sweats. Denies unexplained weight loss. Denies personal history of cancer. Denies changes in bowel or bladder habits. Denies recent unreported falls. Denies new or worsening dyspnea or wheezing. Denies headaches or dizziness.  Reports numbness, tingling or weakness  In the extremities. In B hands. Denies dizziness or presyncopal episodes Denies lower extremity edema     HISTORY & PERTINENT PRIOR DATA:  Prior History reviewed and updated per electronic medical record.  Significant history, findings, studies and interim changes  include:  reports that he has quit smoking. He has a 5.00 pack-year smoking history. he has never used smokeless tobacco. No results for input(s): HGBA1C, LABURIC, CREATINE in the last 8760 hours. No specialty comments available. Problem  Bilateral Leg Edema  Type II Diabetes Mellitus With Manifestations ([facility])     OBJECTIVE:  VS:  HT:6\' 1"  (185.4 cm)   WT:224 lb 9.6 oz (101.9 kg)  BMI:29.64    BP:132/78  HR:76bpm  TEMP: ( )  RESP:94 %  PHYSICAL EXAM: Constitutional: WDWN, Non-toxic appearing. Psychiatric: Alert & appropriately interactive. Not depressed or anxious appearing. Respiratory: No increased work of breathing. Trachea Midline Eyes: Pupils are equal. EOM intact without nystagmus. No scleral icterus Cardiovascular:  Peripheral Pulses: peripheral pulses symmetrical No clubbing or cyanosis appreciated Capillary Refill is normal, less than 2 seconds  1+ pitting edema bilateral lower extremities Sensory Exam: intact to light touch   Bilateral knees: Moderate effusions bilaterally.  Limited extension by approximately 3 degrees on the left and 5 degrees on the right.  This improved following aspiration.  He is ligamentously intact.   ASSESSMENT & PLAN:   1. Chronic pain of both knees   2. Type 2 diabetes mellitus with complication, with long-term current use of insulin ([place])   3. Primary osteoarthritis of both knees   4. Bilateral leg edema    PLAN: Aspiration and injection today with corticosteroid but will need to consider Visco supplementation again in 10 weeks and we will plan to request this from his pharmacy after looking into whether a longer acting triamcinolone injection  with ZILRETTA (triamcinolone acetonide extended-release injectable suspension) is a feasible option.  We also discussed that for Visco supplementation in the future serial injections may provide more benefit given the persistent effusions that he has.  We will consider Orthovisc at follow-up if the  above is not available  ++++++++++++++++++++++++++++++++++++++++++++ Orders & Meds: Orders Placed This Encounter  Procedures  . [place] LIMITED JOINT SPACE STRUCTURES LOW BILAT(NO LINKED CHARGES)  . US GUIDED NEEDLE PLACEMENT(NO LINKED CHARGES)    No orders of the defined types were placed in this encounter.   ++++++++++++++++++++++++++++++++++++++++++++ Follow-up: Return in about 10 weeks (around 01/18/2018).   Pertinent documentation may be included in additional procedure notes, imaging studies, problem based documentation and patient instructions. Please see these sections of the encounter for additional information regarding this visit. CMA/ATC served as Education administrator during this visit. History, Physical, and Plan performed by medical provider. Documentation and orders reviewed and attested to.      Gerda Diss, [place] [facility] Physician

## 2017-11-09 NOTE — Assessment & Plan Note (Signed)
Trace today. Reports good improvement since stopping Norvasc

## 2017-11-09 NOTE — Assessment & Plan Note (Signed)
Most recent A1c's Per chart review are acceptable and less than 7. Caution with corticosteroids.

## 2017-11-09 NOTE — Patient Instructions (Signed)

## 2017-11-09 NOTE — Procedures (Signed)
PROCEDURE NOTE -  ULTRASOUND GUIDEDInjection: Bilateral knee aspirations and injections Images were obtained and interpreted by myself, Teresa Coombs, DO  Images have been saved and stored to PACS system. Images obtained on: GE S7 Ultrasound machine  ULTRASOUND FINDINGS:  Large effusions bilaterally  DESCRIPTION OF PROCEDURE:  The patient's clinical condition is marked by substantial pain and/or significant functional disability. Other conservative therapy has not provided relief, is contraindicated, or not appropriate. There is a reasonable likelihood that injection will significantly improve the patient's pain and/or functional impairment.  After discussing the risks, benefits and expected outcomes of the injection and all questions were reviewed and answered, the patient wished to undergo the above named procedure. Verbal consent was obtained.  The ultrasound was used to identify the target structure and adjacent neurovascular structures. The skin was then prepped in sterile fashion and the target structure was injected under direct visualization using sterile technique as below:  Bilateral Knee Corticosteroid Injections - Each knee injected in identical fashion as below PREP: Alcohol, Ethel Chloride, 5cc 1% lidocaine on 25g 1.5 in. Needle APPROACH: superiolateral, stopcock technique, 18g 1.5in. INJECTATE:  2cc 0.5% marcaine, 1cc 40mg /mL DepoMedrol  ASPIRATE:    RIGHT: 30cc serous slightly cloudy yellow fluid   LEFT: 40cc serous clear yellow fluid  DRESSING: Band-Aid and 6" Ace-Wrap    Post procedural instructions including recommending icing and warning signs for infection were reviewed.  This procedure was well tolerated and there were no complications.   IMPRESSION: Succesful US Guided Aspiration and Injection

## 2018-01-18 ENCOUNTER — Encounter: Payer: Self-pay | Admitting: Sports Medicine

## 2018-01-18 ENCOUNTER — Ambulatory Visit: Payer: Self-pay

## 2018-01-18 ENCOUNTER — Ambulatory Visit: Payer: BLUE CROSS/BLUE SHIELD | Admitting: Sports Medicine

## 2018-01-18 VITALS — BP 122/82 | HR 66 | Ht 73.0 in | Wt 224.2 lb

## 2018-01-18 DIAGNOSIS — M170 Bilateral primary osteoarthritis of knee: Secondary | ICD-10-CM

## 2018-01-18 NOTE — Patient Instructions (Signed)

## 2018-01-18 NOTE — Procedures (Addendum)
PROCEDURE NOTE:  Ultrasound Guided: Aspiration and Injection: bilateral knee Images were obtained and interpreted by myself, Teresa Coombs, DO  Images have been saved and stored to PACS system. Images obtained on: GE S7 Ultrasound machine  ULTRASOUND FINDINGS:  Bilateral Large Effusions  DESCRIPTION OF PROCEDURE:  The patient's clinical condition is marked by substantial pain and/or significant functional disability. Other conservative therapy has not provided relief, is contraindicated, or not appropriate. There is a reasonable likelihood that injection will significantly improve the patient's pain and/or functional impairment.  After discussing the risks, benefits and expected outcomes of the injection and all questions were reviewed and answered, the patient wished to undergo the above named procedure. Verbal consent was obtained.  The ultrasound was used to identify the target structure and adjacent neurovascular structures. The skin was then prepped in sterile fashion and the target structure was injected under direct visualization using sterile technique as below:  BILATERAL KNEES INJECTED IN SIMILAR FASHION AS BELOW  PREP: Alcohol, Ethel Chloride, 5cc 1% lidocaine on  1.5 in. Needle APPROACH: superiolateral, stopcock technique, 18g 1.5in.  INJECTATE: 2cc 0.5% marcaine, 1cc: 40mg /mL DepoMedrol   ASPIRATE: ASPIRATE: right - 27 straw colored; left -30 straw colored   DRESSING: Band-Aid  Post procedural instructions including recommending icing and warning signs for infection were reviewed.   This procedure was well tolerated and there were no complications.   IMPRESSION: Succesful Ultrasound Guided: Aspiration and Injection

## 2018-01-18 NOTE — Progress Notes (Signed)
[name] [name]. [name] [name], [place] at [facility] [phone]  [name] [name] - 67 y.o. male MRN [id number]  Date of birth: November 27, 1950  Visit Date: 01/18/2018  PCP: System, Pcp Not In   Referred by: No ref. provider found   Scribe for today's visit: Josepha Pigg, CMA     SUBJECTIVE:  [name] [name] is here for Follow-up (bilateral knee pain)  03/05/17: Symptoms as above.  Prior patient of mine at [facility].  Has done well with last injections including cortisone and Visco supplementation with Synvisc 1 up until Friday.  Ibuprofen has been mildly helpful but still having mainly medial sided pain.  Denies any catching, locking or giving way. Summary swelling has been present for quite some time due to amlodipine use.  Is trying to reestablish with his prior primary doctor who is now doing [facility].  05/04/17: Pt presents today in follow-up of osteoarthritis of both knees. He had steroid injection 03/05/17. He takes Ibuprofen as needed for the pain. He has purchases Monovisc injections and had them shipped to the office and would like to receive these injections today.  Pt reports increased pain in both knees since receiving the steroid injection. His knees swell and swelling increases throughout the day. Pt reports that the steroid injection increased his BG but he was able to get it under control after 3-4 days.  Pt denies fever, chills, night sweats, unintentional weight gain or loss.   11/09/17: Compared to the last office visit on 05/04/17, his previously described B knee pain symptoms are worsening with increased pain and swelling in both knees over the past month. Current symptoms are 4-5/10 pain at rest and 6-7/10 pain at it's worst & are nonradiating He has been taking Advil as needed w/ very limited relief.  01/18/18: Compared to the last office visit, his previously described symptoms show no change, he continues to c/o swelling in  both knees.  Current symptoms are moderate & are nonradiating He has been taking IBU as needed with some relief.  Last steroid injection 11/09/2017. Monovisc injection 05/04/17.    ROS Denies night time disturbances. Denies fevers, chills, or night sweats. Denies unexplained weight loss. Denies personal history of cancer. Denies changes in bowel or bladder habits. Denies recent unreported falls. Reports new or worsening dyspnea or wheezing, had cold last week. Denies headaches or dizziness.  Reports numbness, tingling in hands. Denies dizziness or presyncopal episodes Reports lower extremity edema    HISTORY & PERTINENT PRIOR DATA:  Prior History reviewed and updated per electronic medical record.  Significant history, findings, studies and interim changes include:  reports that he has quit smoking. He has a 5.00 pack-year smoking history. he has never used smokeless tobacco. No results for input(s): HGBA1C, LABURIC, CREATINE in the last 8760 hours. No specialty comments available. Problem  Primary Osteoarthritis of Both Knees   Considering Zilretta injections. Good improvement with corticosteroid injections however short-lived. Mild improvement with Visco supplementation.     OBJECTIVE:  VS:  HT:6\' 1"  (185.4 cm)   WT:224 lb 3.2 oz (101.7 kg)  BMI:29.59    BP:122/82  HR:66bpm  TEMP: ( )  RESP:96 %   PHYSICAL EXAM: Constitutional: WDWN, Non-toxic appearing. Psychiatric: Alert & appropriately interactive.  Not depressed or anxious appearing. Respiratory: No increased work of breathing.  Trachea Midline Eyes: Pupils are equal.  EOM intact without nystagmus.  No scleral icterus  NEUROVASCULAR exam: No clubbing or cyanosis appreciated No significant  venous stasis changes Capillary Refill: normal, less than 2 seconds   BILATERAL KNEES:  Large non-tense effusions bilaterally.  Ligamentously stable.  Pain with patellar grind.  ASSESSMENT & PLAN:   1. Primary  osteoarthritis of both knees    ++++++++++++++++++++++++++++++++++++++++++++ Orders & Meds:  Orders Placed This Encounter  Procedures  . [place] MSK POCT ULTRASOUND   No orders of the defined types were placed in this encounter.   ++++++++++++++++++++++++++++++++++++++++++++ PLAN:   Findings:  Bilateral knee injections today.  We will plan to set him up to have him return in 10 weeks and at that time will plan to do Zilretta if he is having persistent symptoms.  Authorization for checking benefits was obtained today but there is a chance that he will be transitioning insurance between now 10 weeks from now and if this is the case he will call us so that we can update his insurance information.  >50% of this 25 minute visit spent in direct patient counseling and/or coordination of care.  Discussion was focused on education regarding the in discussing the pathoetiology and anticipated clinical course of the above condition.  Long discussion regarding the merits of Zilretta which is a relatively new long acting corticosteroid at should help reduce the amount of significant synovitis and pain that he has for up to and possibly beyond 12 weeks.    Follow-up: Return in about 10 weeks (around 03/29/2018) for zilretta injections.   Pertinent documentation may be included in additional procedure notes, imaging studies, problem based documentation and patient instructions. Please see these sections of the encounter for additional information regarding this visit. CMA/ATC served as Education administrator during this visit. History, Physical, and Plan performed by medical provider. Documentation and orders reviewed and attested to.      Gerda Diss, [place] [facility] Physician

## 2018-03-01 ENCOUNTER — Encounter: Payer: Self-pay | Admitting: Sports Medicine

## 2018-03-01 ENCOUNTER — Ambulatory Visit: Payer: Self-pay

## 2018-03-01 ENCOUNTER — Ambulatory Visit: Payer: BLUE CROSS/BLUE SHIELD | Admitting: Sports Medicine

## 2018-03-01 DIAGNOSIS — M170 Bilateral primary osteoarthritis of knee: Secondary | ICD-10-CM

## 2018-03-01 NOTE — Patient Instructions (Signed)

## 2018-03-01 NOTE — Progress Notes (Signed)
[name]. Praveen Coia, [place] at [facility] [phone]  [name] [name] - 67 y.o. male MRN [id number]  Date of birth: [birth year]  Visit Date: 03/01/2018  PCP: System, Pcp Not In   Referred by: No ref. provider found  Scribe for today's visit: Josepha Pigg, CMA     SUBJECTIVE:  [name] [name] is here for Follow-up (bilateral knee pain)  03/05/17: Symptoms as above.  Prior patient of mine at [facility].  Has done well with last injections including cortisone and Visco supplementation with Synvisc 1 up until Friday.  Ibuprofen has been mildly helpful but still having mainly medial sided pain.  Denies any catching, locking or giving way. Summary swelling has been present for quite some time due to amlodipine use.  Is trying to reestablish with his prior primary doctor who is now doing [facility].  05/04/17: Pt presents today in follow-up of osteoarthritis of both knees. He had steroid injection 03/05/17. He takes Ibuprofen as needed for the pain. He has purchases Monovisc injections and had them shipped to the office and would like to receive these injections today.  Pt reports increased pain in both knees since receiving the steroid injection. His knees swell and swelling increases throughout the day. Pt reports that the steroid injection increased his BG but he was able to get it under control after 3-4 days.  Pt denies fever, chills, night sweats, unintentional weight gain or loss.   11/09/17: Compared to the last office visit on 05/04/17, his previously described B knee pain symptoms are worsening with increased pain and swelling in both knees over the past month. Current symptoms are 4-5/10 pain at rest and 6-7/10 pain at it's worst & are nonradiating He has been taking Advil as needed w/ very limited relief.  01/18/18: Compared to the last office visit, his previously described symptoms show no change, he continues to c/o swelling in  both knees.  Current symptoms are moderate & are nonradiating He has been taking IBU as needed with some relief.  Last steroid injection 11/09/2017. Monovisc injection 05/04/17.   03/01/2018: Compared to the last office visit, his previously described symptoms are worsening. He has noticed increased pain and swelling around both knees. He has been doing more yard work recently and feels that swelling is related to that.  Current symptoms are moderate & are nonradiating He has been wearing compression sleeve occasionally but doesn't feel that it is very beneficial.  ROS Denies night time disturbances. Denies fevers, chills, or night sweats. Denies unexplained weight loss. Denies personal history of cancer. Denies changes in bowel or bladder habits. Denies recent unreported falls. Denies new or worsening dyspnea or wheezing. Denies headaches or dizziness.  Denies numbness, tingling or weakness  In the extremities.  Denies dizziness or presyncopal episodes Reports lower extremity edema    HISTORY & PERTINENT PRIOR DATA:  Prior History reviewed and updated per electronic medical record.  Significant/pertinent history, findings, studies include:  reports that he has quit smoking. He has a 5.00 pack-year smoking history. He has never used smokeless tobacco. No results for input(s): HGBA1C, LABURIC, CREATINE in the last 8760 hours. 03/13/18 - [employer] covers Zilretta at 80%. Pt responsibility is 20% with a $50 copay.   01/25/18- Zilretta covered at 85% after $400 deductible has been met, also $40 copay. Once OOP $2500 is met insurance will cover at 100% No problems updated.  OBJECTIVE:  VS:  HT:    WT:  BMI:     BP:   HR: bpm  TEMP: ( )  RESP:    PHYSICAL EXAM: Constitutional: WDWN, Non-toxic appearing. Psychiatric: Alert & appropriately interactive.  Not depressed or anxious appearing. Respiratory: No increased work of breathing.  Trachea Midline Eyes: Pupils are equal.  EOM intact  without nystagmus.  No scleral icterus  Vascular Exam: warm to touch no edema  lower extremity neuro exam: unremarkable  MSK Exam: Bilateral osteophytic bossing of these.  He has a small effusion bilaterally.  Ligamentously has solid endpoints but does have to 3 mm of opening bilaterally.   ASSESSMENT & PLAN:   1. Primary osteoarthritis of both knees     PLAN: Repeat knee injections performed today with standard steroid.  Follow-up for consideration of Zilretta once he is more clear on his insurance status.  Follow-up: Return in about 2 months (around 05/10/2018).      Please see additional documentation for Objective, Assessment and Plan sections. Pertinent additional documentation may be included in corresponding procedure notes, imaging studies, problem based documentation and patient instructions. Please see these sections of the encounter for additional information regarding this visit.  CMA/ATC served as Education administrator during this visit. History, Physical, and Plan performed by medical provider. Documentation and orders reviewed and attested to.      Gerda Diss, [facility] Physician

## 2018-03-29 ENCOUNTER — Ambulatory Visit: Payer: BLUE CROSS/BLUE SHIELD | Admitting: Sports Medicine

## 2018-03-29 ENCOUNTER — Encounter: Payer: Self-pay | Admitting: Sports Medicine

## 2018-03-29 NOTE — Procedures (Signed)
PROCEDURE NOTE:  Ultrasound Guided: Injection: Bilateral knee Images were obtained and interpreted by myself, Teresa Coombs, DO  Images have been saved and stored to PACS system. Images obtained on: GE S7 Ultrasound machine    ULTRASOUND FINDINGS:  Generalized effusions bilaterally   DESCRIPTION OF PROCEDURE:  The patient's clinical condition is marked by substantial pain and/or significant functional disability. Other conservative therapy has not provided relief, is contraindicated, or not appropriate. There is a reasonable likelihood that injection will significantly improve the patient's pain and/or functional impairment.   After discussing the risks, benefits and expected outcomes of the injection and all questions were reviewed and answered, the patient wished to undergo the above named procedure.  Verbal consent was obtained.  The ultrasound was used to identify the target structure and adjacent neurovascular structures. The skin was then prepped in sterile fashion and the target structure was injected under direct visualization using sterile technique as below:  Bilateral knees were injected in similar fashion right followed by left.  PREP: Alcohol, Ethel Chloride and 5 cc 1% lidocaine on 25g 1.5 in. needle APPROACH: superiolateral, sterile needle exchange technique, 18g 1.5 in. INJECTATE: 2 cc 0.5% Marcaine and 1 cc 40mg /mL DepoMedrol ASPIRATE: Right knee with 20 cc of serous aspirate Left knee with 25 cc of serous aspirate. DRESSING: Band-Aid  Post procedural instructions including recommending icing and warning signs for infection were reviewed.    This procedure was well tolerated and there were no complications.   IMPRESSION: Succesful Ultrasound Guided: Injection

## 2018-04-05 ENCOUNTER — Ambulatory Visit: Payer: BLUE CROSS/BLUE SHIELD | Admitting: Sports Medicine

## 2018-04-05 ENCOUNTER — Encounter: Payer: Self-pay | Admitting: Sports Medicine

## 2018-04-05 ENCOUNTER — Ambulatory Visit: Payer: Self-pay

## 2018-04-05 DIAGNOSIS — M170 Bilateral primary osteoarthritis of knee: Secondary | ICD-10-CM | POA: Diagnosis not present

## 2018-04-05 NOTE — Patient Instructions (Signed)

## 2018-04-05 NOTE — Progress Notes (Signed)
[name]. Talma Aguillard, [place] at [facility] [phone]  [name] [name] - 67 y.o. male MRN [id number]  Date of birth: 10/12/1951  Visit Date: 04/05/2018  PCP: System, Pcp Not In   Referred by: No ref. provider found  Scribe for today's visit: Wendy Poet, LAT, ATC     SUBJECTIVE:  [name] [name] is here for Follow-up (B knee pain) .   03/05/17: Symptoms as above.  Prior patient of mine at [facility].  Has done well with last injections including cortisone and Visco supplementation with Synvisc 1 up until Friday.  Ibuprofen has been mildly helpful but still having mainly medial sided pain.  Denies any catching, locking or giving way. Summary swelling has been present for quite some time due to amlodipine use.  Is trying to reestablish with his prior primary doctor who is now doing [facility].  05/04/17: Pt presents today in follow-up of osteoarthritis of both knees. He had steroid injection 03/05/17. He takes Ibuprofen as needed for the pain. He has purchases Monovisc injections and had them shipped to the office and would like to receive these injections today.  Pt reports increased pain in both knees since receiving the steroid injection. His knees swell and swelling increases throughout the day. Pt reports that the steroid injection increased his BG but he was able to get it under control after 3-4 days.  Pt denies fever, chills, night sweats, unintentional weight gain or loss.   11/09/17: Compared to the last office visit on 05/04/17, his previously described B knee pain symptoms are worsening with increased pain and swelling in both knees over the past month. Current symptoms are 4-5/10 pain at rest and 6-7/10 pain at it's worst & are nonradiating He has been taking Advil as needed w/ very limited relief.  01/18/18: Compared to the last office visit, his previously described symptoms show no change, he continues to c/o swelling in  both knees.  Current symptoms are moderate & are nonradiating He has been taking IBU as needed with some relief.  Last steroid injection 11/09/2017. Monovisc injection 05/04/17.   03/01/2018: Compared to the last office visit, his previously described symptoms are worsening. He has noticed increased pain and swelling around both knees. He has been doing more yard work recently and feels that swelling is related to that.  Current symptoms are moderate & are nonradiating He has been wearing compression sleeve occasionally but doesn't feel that it is very beneficial.  04/05/2018: Compared to the last office visit on 03/01/18, his previously described B knee pain symptoms show no change.  He states that he con't to have issues w/ fluid build-up on his knees but today they're not too bad. Current symptoms are moderate & are nonradiating He has been wearing compression socks but he's not wearing any compression on his knee at this point.  Con't to take IBU prn.  ROS Denies night time disturbances. Denies fevers, chills, or night sweats. Denies unexplained weight loss. Denies personal history of cancer. Denies changes in bowel or bladder habits. Denies recent unreported falls. Denies new or worsening dyspnea or wheezing. Denies headaches or dizziness.  Denies numbness, tingling or weakness  In the extremities.  Denies dizziness or presyncopal episodes Reports lower extremity edema - in the B knees   HISTORY & PERTINENT PRIOR DATA:  Prior History reviewed and updated per electronic medical record.  Significant/pertinent history, findings, studies include:  reports that he has quit smoking. He  has a 5.00 pack-year smoking history. He has never used smokeless tobacco. No results for input(s): HGBA1C, LABURIC, CREATINE in the last 8760 hours. 03/13/18 - [employer] covers Zilretta at 80%. Pt responsibility is 20% with a $50 copay.   01/25/18- Zilretta covered at 85% after $400 deductible has been met,  also $40 copay. Once OOP $2500 is met insurance will cover at 100% No problems updated.  OBJECTIVE:  VS:  HT:_0  (185.4 cm)   WT:220 lb 12.8 oz (100.2 kg)  BMI:29.14    BP:124/86  HR:90bpm  TEMP: ( )  RESP:95 %   PHYSICAL EXAM: Constitutional: WDWN, Non-toxic appearing. Psychiatric: Alert & appropriately interactive.  Not depressed or anxious appearing. Respiratory: No increased work of breathing.  Trachea Midline Eyes: Pupils are equal.  EOM intact without nystagmus.  No scleral icterus  Vascular Exam: warm to touch no edema  lower extremity neuro exam: unremarkable  MSK Exam: Small knee effusions bilaterally.  He is ligamentously stable.  Generalized osteoarthritic bossing.   ASSESSMENT & PLAN:   1. Primary osteoarthritis of both knees     PLAN: Discussed multiple options including Zilretta injections and we will get him performed today.  We will plan to check in 12 weeks to ensure good clinical improvement.  Can consider repeating this at any point.  Continue with compression and avoidance of exacerbating activities.  Follow-up: Return in about 3 months (around 06/28/2018) for consideration of repeat injections.     Please see additional documentation for Objective, Assessment and Plan sections. Pertinent additional documentation may be included in corresponding procedure notes, imaging studies, problem based documentation and patient instructions. Please see these sections of the encounter for additional information regarding this visit.  CMA/ATC served as Education administrator during this visit. History, Physical, and Plan performed by medical provider. Documentation and orders reviewed and attested to.      Gerda Diss, [facility] Physician

## 2018-04-05 NOTE — Procedures (Signed)
PROCEDURE NOTE:  Ultrasound Guided: Injection: Bilateral knee, Zilretta Injections Images were obtained and interpreted by myself, Teresa Coombs, DO  Images have been saved and stored to PACS system. Images obtained on: GE S7 Ultrasound machine    ULTRASOUND FINDINGS:  small to moderate effusions, moderate/significant synovitis  DESCRIPTION OF PROCEDURE:  The patient's clinical condition is marked by substantial pain and/or significant functional disability. Other conservative therapy has not provided relief, is contraindicated, or not appropriate. There is a reasonable likelihood that injection will significantly improve the patient's pain and/or functional impairment.   After discussing the risks, benefits and expected outcomes of the injection and all questions were reviewed and answered, the patient wished to undergo the above named procedure.  Verbal consent was obtained.  The ultrasound was used to identify the target structure and adjacent neurovascular structures. The skin was then prepped in sterile fashion and the target structure was injected under direct visualization using sterile technique as below:  PREP: Alcohol and Ethel Chloride APPROACH: superiolateral, single injection, 21g 2 in. INJECTATE: 5 cc Zilretta (sustained release triamcinolone) ASPIRATE: None DRESSING: Band-Aid  Post procedural instructions including recommending icing and warning signs for infection were reviewed.    This procedure was well tolerated and there were no complications.   IMPRESSION: Succesful Ultrasound Guided: Injection

## 2018-05-03 ENCOUNTER — Ambulatory Visit: Payer: Medicare Other | Admitting: Sports Medicine

## 2018-05-30 DIAGNOSIS — Z79899 Other long term (current) drug therapy: Secondary | ICD-10-CM | POA: Diagnosis not present

## 2018-05-30 DIAGNOSIS — E119 Type 2 diabetes mellitus without complications: Secondary | ICD-10-CM | POA: Diagnosis not present

## 2018-05-30 DIAGNOSIS — Z125 Encounter for screening for malignant neoplasm of prostate: Secondary | ICD-10-CM | POA: Diagnosis not present

## 2018-06-28 ENCOUNTER — Ambulatory Visit: Payer: Medicare Other | Admitting: Sports Medicine

## 2018-07-18 ENCOUNTER — Ambulatory Visit: Payer: Medicare Other | Admitting: Sports Medicine

## 2018-07-18 ENCOUNTER — Encounter: Payer: Self-pay | Admitting: Sports Medicine

## 2018-07-18 ENCOUNTER — Ambulatory Visit: Payer: Self-pay

## 2018-07-18 VITALS — BP 130/84 | HR 83 | Ht 73.0 in | Wt 221.6 lb

## 2018-07-18 DIAGNOSIS — M25562 Pain in left knee: Secondary | ICD-10-CM | POA: Diagnosis not present

## 2018-07-18 DIAGNOSIS — M25461 Effusion, right knee: Secondary | ICD-10-CM

## 2018-07-18 DIAGNOSIS — M25561 Pain in right knee: Secondary | ICD-10-CM | POA: Diagnosis not present

## 2018-07-18 DIAGNOSIS — M170 Bilateral primary osteoarthritis of knee: Secondary | ICD-10-CM

## 2018-07-18 DIAGNOSIS — M25462 Effusion, left knee: Secondary | ICD-10-CM

## 2018-07-18 DIAGNOSIS — G8929 Other chronic pain: Secondary | ICD-10-CM | POA: Diagnosis not present

## 2018-07-18 NOTE — Procedures (Signed)
PROCEDURE NOTE:  Ultrasound Guided: Injection: Bilateral knee, bilateral Orthovisc Images were obtained and interpreted by myself, Teresa Coombs, DO  Images have been saved and stored to PACS system. Images obtained on: GE S7 Ultrasound machine    ULTRASOUND FINDINGS:  Marked degenerative changes with small left effusion and minimal right effusion with marked synovitis bilaterally  DESCRIPTION OF PROCEDURE:  The patient's clinical condition is marked by substantial pain and/or significant functional disability. Other conservative therapy has not provided relief, is contraindicated, or not appropriate. There is a reasonable likelihood that injection will significantly improve the patient's pain and/or functional impairment.   After discussing the risks, benefits and expected outcomes of the injection and all questions were reviewed and answered, the patient wished to undergo the above named procedure.  Verbal consent was obtained.  The ultrasound was used to identify the target structure and adjacent neurovascular structures. The skin was then prepped in sterile fashion and the target structure was injected under direct visualization using sterile technique as below:  Bilateral injections performed under identical technique as below: PREP: Alcohol and Ethel Chloride APPROACH:superiolateral, single injection, 21g 2 in. INJECTATE: 2cc OrthoVisc ASPIRATE: None DRESSING: Band-Aid  Post procedural instructions including recommending icing and warning signs for infection were reviewed.    This procedure was well tolerated and there were no complications.   IMPRESSION: Succesful Ultrasound Guided: Injection

## 2018-07-18 NOTE — Patient Instructions (Addendum)

## 2018-07-18 NOTE — Progress Notes (Signed)
[name] [name]. [name] [name], [place] at [facility] [phone]  [name] [name] - 67 y.o. male MRN [id number]  Date of birth: 1951/01/27  Visit Date: 07/18/2018  PCP: System, Pcp Not In   Referred by: No ref. provider found  Scribe(s) for today's visit: Josepha Pigg, CMA  SUBJECTIVE:  [name] [name] is here for Follow-up (bilateral knee pain)   03/05/17: Prior patient of mine at [facility].  Has done well with last injections including cortisone and Visco supplementation with Synvisc 1 up until Friday.  Ibuprofen has been mildly helpful but still having mainly medial sided pain.  Denies any catching, locking or giving way. Summary swelling has been present for quite some time due to amlodipine use.  Is trying to reestablish with his prior primary doctor who is now doing [facility].  05/04/17: Pt presents today in follow-up of osteoarthritis of both knees. He had steroid injection 03/05/17. He takes Ibuprofen as needed for the pain. He has purchases Monovisc injections and had them shipped to the office and would like to receive these injections today.  Pt reports increased pain in both knees since receiving the steroid injection. His knees swell and swelling increases throughout the day. Pt reports that the steroid injection increased his BG but he was able to get it under control after 3-4 days.  Pt denies fever, chills, night sweats, unintentional weight gain or loss.   11/09/17: Compared to the last office visit on 05/04/17, his previously described B knee pain symptoms are worsening with increased pain and swelling in both knees over the past month. Current symptoms are 4-5/10 pain at rest and 6-7/10 pain at it's worst & are nonradiating He has been taking Advil as needed w/ very limited relief.  01/18/18: Compared to the last office visit, his previously described symptoms show no change, he continues to c/o swelling in both knees.    Current symptoms are moderate & are nonradiating He has been taking IBU as needed with some relief.  Last steroid injection 11/09/2017. Monovisc injection 05/04/17.   03/01/2018: Compared to the last office visit, his previously described symptoms are worsening. He has noticed increased pain and swelling around both knees. He has been doing more yard work recently and feels that swelling is related to that.  Current symptoms are moderate & are nonradiating He has been wearing compression sleeve occasionally but doesn't feel that it is very beneficial.  04/05/2018: Compared to the last office visit on 03/01/18, his previously described B knee pain symptoms show no change.  He states that he con't to have issues w/ fluid build-up on his knees but today they're not too bad. Current symptoms are moderate & are nonradiating He has been wearing compression socks but he's not wearing any compression on his knee at this point.  Con't to take IBU prn.  07/18/2018: Compared to the last office visit, his previously described symptoms are improving. Swelling has decreased. Knee pain has improved overall but he does report recent flare-up of the R knee pain.  Current symptoms are moderate-severe & are nonradiating He continues to have IBU prn and wear compression.   He reports that BG and BP increased for about 1 month after receiving Zilretta injection. He reports having 2 black stools after the injection. He had ?fecal occult blood test? With PCP and was told it was normal.    REVIEW OF SYSTEMS: Denies night time disturbances. Denies fevers, chills, or night sweats. Denies  unexplained weight loss. Denies personal history of cancer. Reports changes in bowel or bladder habits - black stool. Denies recent unreported falls. Denies new or worsening dyspnea or wheezing. Denies headaches or dizziness.  Denies numbness, tingling or weakness  In the extremities.  Denies dizziness or presyncopal  episodes Reports lower extremity edema    HISTORY & PERTINENT PRIOR DATA:  Significant/pertinent history, findings, studies include:  reports that he has quit smoking. He has a 5.00 pack-year smoking history. He has never used smokeless tobacco. No results for input(s): HGBA1C, LABURIC, CREATINE in the last 8760 hours. 03/13/18 - [employer] covers Zilretta at 80%. Pt responsibility is 20% with a $50 copay.   01/25/18- Zilretta covered at 85% after $400 deductible has been met, also $40 copay. Once OOP $2500 is met insurance will cover at 100% No problems updated.  Otherwise prior history reviewed and updated per electronic medical record.    OBJECTIVE:  VS:  HT:_0  (185.4 cm)   WT:221 lb 9.6 oz (100.5 kg)  BMI:29.24    BP:130/84  HR:83bpm  TEMP: ( )  RESP:97 %   PHYSICAL EXAM: CONSTITUTIONAL: Well-developed, Well-nourished and In no acute distress Alert & appropriately interactive. and Not depressed or anxious appearing. RESPIRATORY: No increased work of breathing and Trachea Midline EYES: Pupils are equal., EOM intact without nystagmus. and No scleral icterus.  Lower extremities: Warm and well perfused NEURO: unremarkable Normal associated myotomal distribution strength to manual muscle testing Normal sensation to light touch  MSK Exam: Bilateral Knees: . Slight varus alignment. Slight flexor contracture . No overlying skin changes. . TTP over Medial and lateral joint lines bilaterally.  Suprapatellar pouch is mildly tender with crepitation bilaterally. .  . Ligamentously stable to Varus and valgus strain . Small effusions with generalized synovitis bilaterally   PROCEDURES & DATA REVIEWED:  . US Guided Injection per procedure note  ASSESSMENT   1. Primary osteoarthritis of both knees     PLAN:       . Orthovisc series 1 of 3, follow-up in 1 week for repeat injections, . Can consider aspirations if persistent effusion but only minimal today. . Please see procedure  section and notes. No problem-specific Assessment & Plan notes found for this encounter.   Follow-up: Return in about 1 week (around 07/25/2018) for Orthovisc #2.      Please see additional documentation for Objective, Assessment and Plan sections. Pertinent additional documentation may be included in corresponding procedure notes, imaging studies, problem based documentation and patient instructions. Please see these sections of the encounter for additional information regarding this visit.  CMA/ATC served as Education administrator during this visit. History, Physical, and Plan performed by medical provider. Documentation and orders reviewed and attested to.      Gerda Diss, [facility] Physician

## 2018-07-25 ENCOUNTER — Encounter: Payer: Self-pay | Admitting: Sports Medicine

## 2018-07-25 ENCOUNTER — Ambulatory Visit (INDEPENDENT_AMBULATORY_CARE_PROVIDER_SITE_OTHER): Payer: Medicare Other | Admitting: Sports Medicine

## 2018-07-25 ENCOUNTER — Ambulatory Visit: Payer: Self-pay

## 2018-07-25 VITALS — BP 110/76 | HR 86 | Ht 73.0 in | Wt 222.2 lb

## 2018-07-25 DIAGNOSIS — M170 Bilateral primary osteoarthritis of knee: Secondary | ICD-10-CM | POA: Diagnosis not present

## 2018-07-25 NOTE — Patient Instructions (Signed)

## 2018-07-25 NOTE — Procedures (Signed)
  [name] [name]. [name] [name], [place] at [facility] [phone]  [name] [name] - 67 y.o. male MRN [id number]  Date of birth: February 02, 1951  Visit Date: 07/25/2018  PCP: System, Pcp Not In   Referred by: No ref. provider found  SUBJECTIVE:  [name] [name] is here for a procedure only visit for Orthovisc injection #2 of 2  OBJECTIVE:  PHYSICAL EXAM: Please see previous exam notes for full evaluation of knee exam. MSK Exam: Degenerative bossing   ASSESSMENT   1. Primary osteoarthritis of both knees      PLAN & PROCEDURES:   PROCEDURE NOTE:  Ultrasound Guided: Injection: Bilateral knee, Orthovisc No. 2 of 3 Images were obtained and interpreted by myself, Teresa Coombs, DO  Images have been saved and stored to PACS system. Images obtained on: GE S7 Ultrasound machine    ULTRASOUND FINDINGS:  Small supraphysiologic effusion bilaterally.  DESCRIPTION OF PROCEDURE:  The patient's clinical condition is marked by substantial pain and/or significant functional disability. Other conservative therapy has not provided relief, is contraindicated, or not appropriate. There is a reasonable likelihood that injection will significantly improve the patient's pain and/or functional impairment.   After discussing the risks, benefits and expected outcomes of the injection and all questions were reviewed and answered, the patient wished to undergo the above named procedure.  Verbal consent was obtained.  The ultrasound was used to identify the target structure and adjacent neurovascular structures. The skin was then prepped in sterile fashion and the target structure was injected under direct visualization using sterile technique as below:  Bilateral injections performed under identical technique as below: PREP: Alcohol and Ethel Chloride APPROACH:superiolateral, single injection, 21g 2 in. INJECTATE: 2cc OrthoVisc ASPIRATE: None DRESSING:  Band-Aid  Post procedural instructions including recommending icing and warning signs for infection were reviewed.    This procedure was well tolerated and there were no complications.   IMPRESSION: Succesful Ultrasound Guided: Injection Orthovisc #2 / 3  Follow-up in 1 week for completion of series.         Gerda Diss, [facility] Physician

## 2018-07-31 ENCOUNTER — Telehealth: Payer: Self-pay | Admitting: Sports Medicine

## 2018-07-31 NOTE — Telephone Encounter (Signed)
See note  Copied from [place] [id number]. Topic: General - Other >> Jul 31, 2018  4:39 PM Keene Breath wrote: Reason for CRM: Patient called to request that his appointment with Dr. Paulla Fore be changed to an earlier date because he is in a lot of pain and has been taking a lot of pain medication which he is not comfortable taking.  His original appointment is for 9/23 at 10:20.  There is a same day appt. On 9/19 at 9:00.  Patient would like to be moved to that appointment if possible.  Please call patient if doctor could see him at that time.  CB# [phone].

## 2018-08-01 ENCOUNTER — Ambulatory Visit: Payer: Medicare Other | Admitting: Sports Medicine

## 2018-08-01 NOTE — Telephone Encounter (Signed)
Left voicemail requesting call back to schedule.

## 2018-08-01 NOTE — Telephone Encounter (Signed)
Spoke with pt -  He denies redness, drainage, increased warmth or fever. He has noticed increased swelling. Sx improved with Aleve. He is due for Orthovisc #3 today, would like to come back in prior to 9/23 for 3rd injection. Will discuss with Dr. Paulla Fore and give pt a call back.

## 2018-08-01 NOTE — Telephone Encounter (Signed)
Spoke with Dr Paulla Fore, please work pt in for tomorrow for 3rd orthovisc injection.

## 2018-08-02 ENCOUNTER — Ambulatory Visit: Payer: Self-pay

## 2018-08-02 ENCOUNTER — Ambulatory Visit (INDEPENDENT_AMBULATORY_CARE_PROVIDER_SITE_OTHER): Payer: Medicare Other | Admitting: Sports Medicine

## 2018-08-02 DIAGNOSIS — M170 Bilateral primary osteoarthritis of knee: Secondary | ICD-10-CM

## 2018-08-02 MED ORDER — DICLOFENAC SODIUM 1 % TD GEL: TRANSDERMAL | 1 refills | Status: DC

## 2018-08-02 NOTE — Patient Instructions (Signed)

## 2018-08-02 NOTE — Telephone Encounter (Signed)
Pt called and added-on to end of Dr. Nicolasa Ducking schedule at 11:40 am today.

## 2018-08-02 NOTE — Procedures (Signed)
Patient presents today for Orthovisc No. 3 of 3 Reports mild pain.  Right greater than left knee.  Left knee has moderate effusion, right knee has minimal.  PROCEDURE NOTE:  Ultrasound Guided: Injection: Right knee, Orthovisc No. 3 of 3 Images were obtained and interpreted by myself, Teresa Coombs, DO  Images have been saved and stored to PACS system. Images obtained on: GE S7 Ultrasound machine    ULTRASOUND FINDINGS:  Marked degenerative changes, mild effusion with moderate synovitis  DESCRIPTION OF PROCEDURE:  The patient's clinical condition is marked by substantial pain and/or significant functional disability. Other conservative therapy has not provided relief, is contraindicated, or not appropriate. There is a reasonable likelihood that injection will significantly improve the patient's pain and/or functional impairment.   After discussing the risks, benefits and expected outcomes of the injection and all questions were reviewed and answered, the patient wished to undergo the above named procedure.  Verbal consent was obtained.  The ultrasound was used to identify the target structure and adjacent neurovascular structures. The skin was then prepped in sterile fashion and the target structure was injected under direct visualization using sterile technique as below:  Single injection performed as below: PREP: Alcohol and Ethel Chloride APPROACH:superiolateral, single injection, 21g 2 in. INJECTATE: 2cc OrthoVisc ASPIRATE: None DRESSING: Band-Aid  Post procedural instructions including recommending icing and warning signs for infection were reviewed.    This procedure was well tolerated and there were no complications.   IMPRESSION: Succesful Ultrasound Guided: Injection    PROCEDURE NOTE:  Ultrasound Guided: Aspiration and Injection: Left knee Images were obtained and interpreted by myself, Teresa Coombs, DO  Images have been saved and stored to PACS system. Images obtained on:  GE S7 Ultrasound machine    ULTRASOUND FINDINGS:  Moderate effusion  DESCRIPTION OF PROCEDURE:  The patient's clinical condition is marked by substantial pain and/or significant functional disability. Other conservative therapy has not provided relief, is contraindicated, or not appropriate. There is a reasonable likelihood that injection will significantly improve the patient's pain and/or functional impairment.   After discussing the risks, benefits and expected outcomes of the injection and all questions were reviewed and answered, the patient wished to undergo the above named procedure.  Verbal consent was obtained.  The ultrasound was used to identify the target structure and adjacent neurovascular structures. The skin was then prepped in sterile fashion and the target structure was injected under direct visualization using sterile technique as below:  Single injection performed as below: PREP: Alcohol, Ethel Chloride and 5 cc 1% lidocaine on 25g 1.5 in. needle APPROACH:superiolateral, stopcock technique, 21g 2 in. INJECTATE: 2cc OrthoVisc ASPIRATE: None DRESSING: Band-Aid  Post procedural instructions including recommending icing and warning signs for infection were reviewed.    This procedure was well tolerated and there were no complications.   IMPRESSION: Succesful Ultrasound Guided: Aspiration and Injection

## 2018-08-12 ENCOUNTER — Ambulatory Visit: Payer: Medicare Other | Admitting: Sports Medicine

## 2018-08-14 ENCOUNTER — Telehealth: Payer: Self-pay

## 2018-08-14 NOTE — Telephone Encounter (Signed)
Okay to return for normal steroid injection Friday or next week if he's hurting that badly.  Please make sure no skin changes, fevers, chills, night sweats but low likelihood of infections

## 2018-08-14 NOTE — Telephone Encounter (Signed)
Pt calling to advise that his knee pain has intensified. He received 3rd Orthovisc injection 08/02/2018. Pain is severe with ADLs. He reports minimal swelling, denies increased warmth or erythema. He has tried taking OTC meds and using Pennsaid with no relief. He has tried icing the knee which makes it worse. He reports that pain is so severe he feels that he is unable to function and if he had to work, he wouldn't be able to.

## 2018-08-15 NOTE — Telephone Encounter (Signed)
Spoke with pt, scheduled OV for tomorrow at 2:20.

## 2018-08-16 ENCOUNTER — Ambulatory Visit: Payer: Self-pay

## 2018-08-16 ENCOUNTER — Encounter: Payer: Self-pay | Admitting: Sports Medicine

## 2018-08-16 ENCOUNTER — Ambulatory Visit (INDEPENDENT_AMBULATORY_CARE_PROVIDER_SITE_OTHER): Payer: Medicare Other | Admitting: Sports Medicine

## 2018-08-16 VITALS — BP 116/78 | HR 84 | Ht 73.0 in | Wt 219.2 lb

## 2018-08-16 DIAGNOSIS — M25562 Pain in left knee: Secondary | ICD-10-CM

## 2018-08-16 DIAGNOSIS — G8929 Other chronic pain: Secondary | ICD-10-CM

## 2018-08-16 DIAGNOSIS — M25461 Effusion, right knee: Secondary | ICD-10-CM | POA: Diagnosis not present

## 2018-08-16 DIAGNOSIS — M170 Bilateral primary osteoarthritis of knee: Secondary | ICD-10-CM

## 2018-08-16 DIAGNOSIS — M25561 Pain in right knee: Secondary | ICD-10-CM | POA: Diagnosis not present

## 2018-08-16 NOTE — Procedures (Signed)
PROCEDURE NOTE:  Ultrasound Guided: Injection: Right knee Images were obtained and interpreted by myself, Teresa Coombs, DO  Images have been saved and stored to PACS system. Images obtained on: GE S7 Ultrasound machine    ULTRASOUND FINDINGS:  Tricompartmental degenerative changes Significant effusion  DESCRIPTION OF PROCEDURE:  The patient's clinical condition is marked by substantial pain and/or significant functional disability. Other conservative therapy has not provided relief, is contraindicated, or not appropriate. There is a reasonable likelihood that injection will significantly improve the patient's pain and/or functional impairment.   After discussing the risks, benefits and expected outcomes of the injection and all questions were reviewed and answered, the patient wished to undergo the above named procedure.  Verbal consent was obtained.  The ultrasound was used to identify the target structure and adjacent neurovascular structures. The skin was then prepped in sterile fashion and the target structure was injected under direct visualization using sterile technique as below:  Single injection performed as below: PREP: Alcohol and Ethel Chloride APPROACH:superiolateral, single injection, 21g 2 in. INJECTATE: 2 cc 0.5% Marcaine and 2 cc 40mg /mL DepoMedrol ASPIRATE: None DRESSING: Band-Aid  Post procedural instructions including recommending icing and warning signs for infection were reviewed.    This procedure was well tolerated and there were no complications.   IMPRESSION: Succesful Ultrasound Guided: Injection

## 2018-08-16 NOTE — Patient Instructions (Signed)

## 2018-08-16 NOTE — Progress Notes (Signed)
[name]. Makala Fetterolf, [place] at [facility] [phone]  RHONDA VANGIESON - 67 y.o. male MRN [id number]  Date of birth: May 29, 1951  Visit Date: 08/16/2018  PCP: System, Pcp Not In   Referred by: No ref. provider found  Scribe(s) for today's visit: Josepha Pigg, CMA  SUBJECTIVE:  [name] [name] is here for Follow-up (R knee pain)   03/05/17: Prior patient of mine at [facility] [employer].  Has done well with last injections including cortisone and Visco supplementation with Synvisc 1 up until Friday.  Ibuprofen has been mildly helpful but still having mainly medial sided pain.  Denies any catching, locking or giving way. Summary swelling has been present for quite some time due to amlodipine use.  Is trying to reestablish with his prior primary doctor who is now doing [facility].  05/04/17: Pt presents today in follow-up of osteoarthritis of both knees. He had steroid injection 03/05/17. He takes Ibuprofen as needed for the pain. He has purchases Monovisc injections and had them shipped to the office and would like to receive these injections today.  Pt reports increased pain in both knees since receiving the steroid injection. His knees swell and swelling increases throughout the day. Pt reports that the steroid injection increased his BG but he was able to get it under control after 3-4 days.  Pt denies fever, chills, night sweats, unintentional weight gain or loss.   11/09/17: Compared to the last office visit on 05/04/17, his previously described B knee pain symptoms are worsening with increased pain and swelling in both knees over the past month. Current symptoms are 4-5/10 pain at rest and 6-7/10 pain at it's worst & are nonradiating He has been taking Advil as needed w/ very limited relief.  01/18/18: Compared to the last office visit, his previously described symptoms show no change, he continues to c/o swelling in both knees.  Current  symptoms are moderate & are nonradiating He has been taking IBU as needed with some relief.  Last steroid injection 11/09/2017. Monovisc injection 05/04/17.   03/01/2018: Compared to the last office visit, his previously described symptoms are worsening. He has noticed increased pain and swelling around both knees. He has been doing more yard work recently and feels that swelling is related to that.  Current symptoms are moderate & are nonradiating He has been wearing compression sleeve occasionally but doesn't feel that it is very beneficial.  04/05/2018: Compared to the last office visit on 03/01/18, his previously described B knee pain symptoms show no change.  He states that he con't to have issues w/ fluid build-up on his knees but today they're not too bad. Current symptoms are moderate & are nonradiating He has been wearing compression socks but he's not wearing any compression on his knee at this point.  Con't to take IBU prn.  07/18/2018: Compared to the last office visit, his previously described symptoms are improving. Swelling has decreased. Knee pain has improved overall but he does report recent flare-up of the R knee pain.  Current symptoms are moderate-severe & are nonradiating He continues to have IBU prn and wear compression.   He reports that BG and BP increased for about 1 month after receiving Zilretta injection. He reports having 2 black stools after the injection. He had ?fecal occult blood test? With PCP and was told it was normal.   08/16/2018: Compared to the last office visit on 08/02/18, his previously described R knee pain symptoms  are worsening.  He had his 3rd Orthovisc knee injections at his last visit.  He notes increased swelling in his R knee and some redness.  He also reports an antalgic gait pattern. Current symptoms are severe & are nonradiating He has been using Voltaren gel.  REVIEW OF SYSTEMS: Reports night time disturbances. Denies fevers, chills, or  night sweats. Denies unexplained weight loss. Denies personal history of cancer. Denies changes in bowel or bladder habits. Denies recent unreported falls. Denies new or worsening dyspnea or wheezing. Denies headaches or dizziness.  Denies numbness, tingling or weakness  In the extremities.  Denies dizziness or presyncopal episodes Reports lower extremity edema    HISTORY & PERTINENT PRIOR DATA:  Significant/pertinent history, findings, studies include:  reports that he has quit smoking. He has a 5.00 pack-year smoking history. He has never used smokeless tobacco. No results for input(s): HGBA1C, LABURIC, CREATINE in the last 8760 hours. 03/13/18 - [employer] covers Zilretta at 80%. Pt responsibility is 20% with a $50 copay.   01/25/18- Zilretta covered at 85% after $400 deductible has been met, also $40 copay. Once OOP $2500 is met insurance will cover at 100% Problem  Primary Osteoarthritis of Both Knees   Considering Zilretta injections. Good improvement with corticosteroid injections however short-lived. Mild improvement with Visco supplementation.     Otherwise prior history reviewed and updated per electronic medical record.    OBJECTIVE:  VS:  HT:6' 1"  (185.4 cm)   WT:219 lb 3.2 oz (99.4 kg)  BMI:28.93    BP:116/78  HR:84bpm  TEMP: ( )  RESP:96 %   PHYSICAL EXAM: CONSTITUTIONAL: Well-developed, Well-nourished and In no acute distress Psychiatric: Alert & appropriately interactive. and Not depressed or anxious appearing. RESPIRATORY: No increased work of breathing and Trachea Midline EYES: Pupils are equal., EOM intact without nystagmus. and No scleral icterus.  Lower extremities: EXTREMITY EXAM: Warm and well perfused NEURO: unremarkable Normal associated myotomal distribution strength to manual muscle testing Normal sensation to light touch  MSK Exam: Bilateral Knees: . Slight varus alignment. Slight flexor contracture . No overlying skin changes. . TTP over  Medial and lateral joint lines bilaterally.  Suprapatellar pouch is mildly tender with crepitation bilaterally. .  . Ligamentously stable to Varus and valgus strain . Small effusions with generalized synovitis bilaterally   ASSESSMENT   1. Primary osteoarthritis of both knees   2. Chronic pain of both knees   3. Effusion of right knee     PLAN:  Pertinent additional documentation may be included in corresponding procedure notes, imaging studies, problem based documentation and patient instructions.  Procedures:  . US Guided Injection per procedure note  Medications:  No orders of the defined types were placed in this encounter.  Discussion/Instructions: Primary osteoarthritis of both knees Repeat injection performed today.  We did discuss consultation with orthopedics for total knee arthroplasty and she did this time is most likely definitive treatment given the ongoing pain and symptoms.   >50% of this 25 minute visit spent in direct patient counseling and/or coordination of care.  Discussion was focused on education regarding the in discussing the pathoetiology and anticipated clinical course of the above condition.  Ultimately he will likely require brief stay at skilled facility given lack of support at home due to his wife's recent passing.  I am happy to see him back at any time for repeat injections after 10 week.  He does understand that repeat injections would like a not going to continue to provide him  with benefit desired.  Patient aware minimal time between injection and surgery is 6 weeks.  . repeat injection today . Discussed red flag symptoms that warrant earlier emergent evaluation and patient voices understanding. . Activity modifications and the importance of avoiding exacerbating activities (limiting pain to no more than a 4 / 10 during or following activity) recommended and discussed.  Follow-up:  . Return if symptoms worsen or fail to improve.  . If any lack  of improvement consider: referral to Orthopedics for total knee arthroplasty . At follow up will plan to consider: repeat corticosteroid injections     CMA/ATC served as scribe during this visit. History, Physical, and Plan performed by medical provider. Documentation and orders reviewed and attested to.      Gerda Diss, [facility] Physician

## 2018-08-21 ENCOUNTER — Other Ambulatory Visit: Payer: Self-pay | Admitting: Physical Therapy

## 2018-08-21 MED ORDER — TRAMADOL HCL 50 MG PO TABS: 50.0000 mg | ORAL_TABLET | Freq: Four times a day (QID) | ORAL | 0 refills | Status: DC | PRN

## 2018-08-22 ENCOUNTER — Ambulatory Visit (INDEPENDENT_AMBULATORY_CARE_PROVIDER_SITE_OTHER): Payer: Medicare Other | Admitting: Sports Medicine

## 2018-08-22 ENCOUNTER — Ambulatory Visit (INDEPENDENT_AMBULATORY_CARE_PROVIDER_SITE_OTHER): Payer: Medicare Other

## 2018-08-22 ENCOUNTER — Encounter: Payer: Self-pay | Admitting: Sports Medicine

## 2018-08-22 ENCOUNTER — Other Ambulatory Visit: Payer: Self-pay | Admitting: Sports Medicine

## 2018-08-22 VITALS — BP 102/78 | HR 94 | Temp 97.8°F | Ht 73.0 in | Wt 215.8 lb

## 2018-08-22 DIAGNOSIS — M170 Bilateral primary osteoarthritis of knee: Secondary | ICD-10-CM

## 2018-08-22 DIAGNOSIS — M1712 Unilateral primary osteoarthritis, left knee: Secondary | ICD-10-CM | POA: Diagnosis not present

## 2018-08-22 DIAGNOSIS — M1711 Unilateral primary osteoarthritis, right knee: Secondary | ICD-10-CM | POA: Diagnosis not present

## 2018-08-22 MED ORDER — COLCRYS 0.6 MG PO TABS: 0.6000 mg | ORAL_TABLET | Freq: Two times a day (BID) | ORAL | 1 refills | Status: DC

## 2018-08-22 NOTE — Progress Notes (Signed)
[name], [place] at [facility] [phone]  BERTRAM HADDIX - 67 y.o. male MRN [id number]  Date of birth: 1951/05/06  Visit Date: 08/22/2018  PCP: System, Pcp Not In   Referred by: No ref. provider found   Scribe(s) for today's visit: Wendy Poet, LAT, ATC  SUBJECTIVE:  [name] [name] is here for Follow-up (R knee pain)    HPI: 03/05/17: Prior patient of mine at [facility].  Has done well with last injections including cortisone and Visco supplementation with Synvisc 1 up until Friday.  Ibuprofen has been mildly helpful but still having mainly medial sided pain.  Denies any catching, locking or giving way. Summary swelling has been present for quite some time due to amlodipine use.  Is trying to reestablish with his prior primary doctor who is now doing [facility].  05/04/17: Pt presents today in follow-up of osteoarthritis of both knees. He had steroid injection 03/05/17. He takes Ibuprofen as needed for the pain. He has purchases Monovisc injections and had them shipped to the office and would like to receive these injections today.  Pt reports increased pain in both knees since receiving the steroid injection. His knees swell and swelling increases throughout the day. Pt reports that the steroid injection increased his BG but he was able to get it under control after 3-4 days.  Pt denies fever, chills, night sweats, unintentional weight gain or loss.   11/09/17: Compared to the last office visit on 05/04/17, his previously described B knee pain symptoms are worsening with increased pain and swelling in both knees over the past month. Current symptoms are 4-5/10 pain at rest and 6-7/10 pain at it's worst & are nonradiating He has been taking Advil as needed w/ very limited relief.  01/18/18: Compared to the last office visit, his previously described symptoms show no change, he continues to c/o swelling in both knees.    Current symptoms are moderate & are nonradiating He has been taking IBU as needed with some relief.  Last steroid injection 11/09/2017. Monovisc injection 05/04/17.   03/01/2018: Compared to the last office visit, his previously described symptoms are worsening. He has noticed increased pain and swelling around both knees. He has been doing more yard work recently and feels that swelling is related to that.  Current symptoms are moderate & are nonradiating He has been wearing compression sleeve occasionally but doesn't feel that it is very beneficial.  04/05/2018: Compared to the last office visit on 03/01/18, his previously described B knee pain symptoms show no change.  He states that he con't to have issues w/ fluid build-up on his knees but today they're not too bad. Current symptoms are moderate & are nonradiating He has been wearing compression socks but he's not wearing any compression on his knee at this point.  Con't to take IBU prn.  07/18/2018: Compared to the last office visit, his previously described symptoms are improving. Swelling has decreased. Knee pain has improved overall but he does report recent flare-up of the R knee pain.  Current symptoms are moderate-severe & are nonradiating He continues to have IBU prn and wear compression.  He reports that BG and BP increased for about 1 month after receiving Zilretta injection. He reports having 2 black stools after the injection. He had ?fecal occult blood test? With PCP and was told it was normal.   08/16/2018: Compared to the last office visit on 08/02/18, his previously described  R knee pain symptoms are worsening.  He had his 3rd Orthovisc knee injections at his last visit.  He notes increased swelling in his R knee and some redness.  He also reports an antalgic gait pattern. Current symptoms are severe & are nonradiating He has been using Voltaren gel.  08/22/2018: Compared to the last office visit, his previously described  symptoms are worsening. He has tenderness to palpation on the medial aspect of the knee. He reports a burning sensation near the injection site.  Current symptoms are moderate-severe & are nonradiating He has been taking Tramadol with some relief. He is unable to ice the knee d/t the tenderness. Voltaren gel has become ineffective.    REVIEW OF SYSTEMS: Reports night time disturbances. Denies fevers, chills, or night sweats. Denies unexplained weight loss. Denies personal history of cancer. Denies changes in bowel or bladder habits. Denies recent unreported falls. Denies new or worsening dyspnea or wheezing. Denies headaches or dizziness.  Denies numbness, tingling or weakness  In the extremities.  Denies dizziness or presyncopal episodes Reports lower extremity edema    HISTORY:  Prior history reviewed and updated per electronic medical record.  Social History   Occupational History  . Not on file  Tobacco Use  . Smoking status: Former Smoker    Packs/day: 0.50    Years: 10.00    Pack years: 5.00  . Smokeless tobacco: Never Used  Substance and Sexual Activity  . Alcohol use: No  . Drug use: No  . Sexual activity: Not on file   Social History   Social History Narrative  . Not on file    DATA OBTAINED & REVIEWED:  No results for input(s): HGBA1C, LABURIC, CREATINE in the last 8760 hours. . 08/22/18 XR Bilateral Knees -significant tricompartmental degenerative changes worse in the right medial compartment.  Marked chondrocalcinosis. . 08/16/2018: Corticosteroid injection right knee. . Orthovisc series began on 07/18/2018  . 04/05/2018: Bilateral Zilretta injections .   OBJECTIVE:  VS:  HT:6\' 1"  (185.4 cm)   WT:215 lb 12.8 oz (97.9 kg)  BMI:28.48    BP:102/78  HR:94bpm  TEMP:97.8 F (36.6 C)(Oral)  RESP:94 %   PHYSICAL EXAM: CONSTITUTIONAL: Well-developed, Well-nourished and In no acute distress PSYCHIATRIC: Alert & appropriately interactive. and Not depressed  or anxious appearing. RESPIRATORY: No increased work of breathing and Trachea Midline EYES: Pupils are equal., EOM intact without nystagmus. and No scleral icterus.  VASCULAR EXAM: Warm and well perfused NEURO: unremarkable  MSK Exam: Bilateral knee  Generalized osteoarthritic bossing.  Well-healed postsurgical incision along the medial knee. No overlying skin changes. Generalized tenderness over the medial and lateral joint lines bilaterally.  Most focally over the medial joint line.   RANGE OF MOTION & STRENGTH  Slight flexor contracture.   SPECIALITY TESTING:  Ligamentously stable to varus and valgus straining.  No significant effusion.       ASSESSMENT   1. Primary osteoarthritis of both knees     PLAN:  Pertinent additional documentation may be included in corresponding procedure notes, imaging studies, problem based documentation and patient instructions.  Procedures:  . None  Medications:  Meds ordered this encounter  Medications  . COLCRYS 0.6 MG tablet    Sig: Take 1 tablet (0.6 mg total) by mouth 2 (two) times daily.    Dispense:  60 tablet    Refill:  1   Discussion/Instructions: No problem-specific Assessment & Plan notes found for this encounter.  . Long discussion today regarding the merits of total  knee arthroplasty.  Ultimately we have exhausted conservative measures he is continuing to have significant pain.  Unfortunately his wife did recently passed away and he still dealing with estate affairs and has been trying to avoid total knee arthroplasty all along but understands that this is most likely the only option he has remaining to remain as functional in his activities as you would like to be.  Spent a long time discussing the options for rehab following surgery including a rehab facility and he will consider this and discuss with his family his options.  We will plan to send him to [place] for surgical consultation but he does understand  that he will need to wait at least 6 weeks from the time of his last corticosteroid injection which was 08/16/2018. . We will try him on Colcrys on a scheduled basis to see if this is beneficial for the chondrocalcinosis component.  Also prescription for tramadol was previously provided and recommend he try half a tablet up to 4 times per day. . Discussed red flag symptoms that warrant earlier emergent evaluation and patient voices understanding. . Activity modifications and the importance of avoiding exacerbating activities (limiting pain to no more than a 4 / 10 during or following activity) recommended and discussed. Marland Kitchen RICE (Rest, ICE, Compression, Elevation) principles reviewed with the patient. . >50% of this 25 minutes minute visit spent in direct patient counseling and/or coordination of care. Discussion was focused on education regarding the in discussing the pathoetiology and anticipated clinical course of the above condition.  Follow-up:  . Return if symptoms worsen or fail to improve.   . Follow-up with orthopedics for ongoing care.     CMA/ATC served as Education administrator during this visit. History, Physical, and Plan performed by medical provider. Documentation and orders reviewed and attested to.      Gerda Diss, [facility] Physician

## 2018-08-27 ENCOUNTER — Ambulatory Visit (INDEPENDENT_AMBULATORY_CARE_PROVIDER_SITE_OTHER): Payer: Medicare Other | Admitting: Orthopaedic Surgery

## 2018-08-27 ENCOUNTER — Encounter (INDEPENDENT_AMBULATORY_CARE_PROVIDER_SITE_OTHER): Payer: Self-pay | Admitting: Orthopaedic Surgery

## 2018-08-27 DIAGNOSIS — M170 Bilateral primary osteoarthritis of knee: Secondary | ICD-10-CM | POA: Diagnosis not present

## 2018-08-27 MED ORDER — METHYLPREDNISOLONE ACETATE 40 MG/ML IJ SUSP
40.0000 mg | INTRAMUSCULAR | Status: AC | PRN
  Administered 2018-08-27: 40 mg via INTRA_ARTICULAR

## 2018-08-27 MED ORDER — LIDOCAINE HCL 1 % IJ SOLN
3.0000 mL | INTRAMUSCULAR | Status: AC | PRN
  Administered 2018-08-27: 3 mL

## 2018-08-27 NOTE — Progress Notes (Addendum)
Office Visit Note   Patient: [name] [name]           Date of Birth: September 20, 1951           MRN: [id number] Visit Date: 08/27/2018              Requested by: No referring provider defined for this encounter. PCP: System, Pcp Not In   Assessment & Plan: Visit Diagnoses:  1. Primary osteoarthritis of both knees     Plan: At this point time [name] does not feel that he is ready for surgery due to the fact that he still dealing with settling the affairs of his wife and just emotionally dealing with this.  He would like to continue some form of conservative treatment understanding the some point time he most likely will need knee arthroplasty.  Therefore we will repeat cortisone injection in the right knee as it is most bothersome in 4 weeks we will inject his left knee.  Then planning on repeat Synvisc injections in both knees in December.  Did discuss with him at length total knee surgery discussed the risks which include but not limited to infection, DVT/PE, wound healing problems, prolonged pain, worsening pain, nerve or vascular injury.  He shown a knee model today also.  Questions were encouraged and answered at length by Dr. Ninfa Linden and myself.  Follow-Up Instructions: Return in about 4 weeks (around 09/24/2018).   Orders:  Orders Placed This Encounter  Procedures  . Large Joint Inj   No orders of the defined types were placed in this encounter.     Procedures: Large Joint Inj: R knee on 08/27/2018 11:46 AM Indications: pain Details: 22 G 1.5 in needle, anterolateral approach  Arthrogram: No  Medications: 3 mL lidocaine 1 %; 40 mg methylPREDNISolone acetate 40 MG/ML Outcome: tolerated well, no immediate complications Procedure, treatment alternatives, risks and benefits explained, specific risks discussed. Consent was given by the patient. Immediately prior to procedure a time out was called to verify the correct patient, procedure, equipment, support staff and  site/side marked as required. Patient was prepped and draped in the usual sterile fashion.       Clinical Data: No additional findings.   Subjective: Chief Complaint  Patient presents with  . Left Knee - Pain  . Right Knee - Pain    HPI  [name] is seen the request of Dr. Reinaldo Meeker for bilateral knee end-stage arthritis.  Patient has right greater than left knee pain.  He is tried supplemental injections, cortisone injections and knee braces without significant relief.  He does state with the cortisone injections helped some with his knee pain.One  the supplemental injections that he had helped for a while but his knee pain is now returning.  No particular injury to either knee.  No mechanical symptoms of either knee.  Patient did recently lose his wife to cancer 2 months ago still dealing with this. Radiographs dated 08/22/2018 bilateral knees show severe degenerative joint disease of both knees.  No acute fractures. Pertinent medical history diabetes mellitus which he states is well controlled coronary artery disease, hypertension hyperlipidemia and chronic anticoagulation on Plavix.  Denies any history of PE DVT  Review of Systems  Constitutional: Negative for chills and fever.  Respiratory: Negative for shortness of breath.   Cardiovascular: Negative for chest pain.     Objective: Vital Signs: There were no vitals taken for this visit.  Physical Exam  Constitutional: He is oriented to person, place,  and time. He appears well-developed and well-nourished. No distress.  Pulmonary/Chest: Effort normal.  Neurological: He is alert and oriented to person, place, and time.  Skin: He is not diaphoretic.  Psychiatric: He has a normal mood and affect.    Ortho Exam Bilateral knees no effusion abnormal warmth or erythema right knee well-healed medial surgical scar.  Right knee lacks 7 to 10 degrees and full extension flexes to approximately 110 degrees.  Left knee 0 to 110 degrees  is tenderness of the right knee along medial lateral joint line tenderness along the medial joint line of left knee.  No instability valgus varus stressing of either knee.  Calves are supple nontender bilaterally. Specialty Comments:  No specialty comments available.  Imaging: No results found.   PMFS History: Patient Active Problem List   Diagnosis Date Noted  . Bilateral leg edema 05/04/2017  . Primary osteoarthritis of both knees 03/05/2017  . Type II diabetes mellitus with manifestations ([place]) 08/18/2016  . CAP (community acquired pneumonia) 08/17/2016  . Right carotid bruit 08/05/2014  . HYPERLIPIDEMIA-MIXED 06/15/2009  . HYPERTENSION, BENIGN 06/15/2009  . CAD, NATIVE VESSEL 06/15/2009   Past Medical History:  Diagnosis Date  . 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 DM (diabetes mellitus) ([place])   . HTN (hypertension)   . Hyperlipidemia     Family History  Problem Relation Age of Onset  . Hypertension Mother   . Cancer Mother   . Heart attack Father   . Hypertension Father   . Diabetes Father   . Hypertension Brother   . Irregular heart beat Brother     Past Surgical History:  Procedure Laterality Date  . CORONARY ANGIOPLASTY WITH STENT PLACEMENT    . LAPAROSCOPIC INGUINAL HERNIA REPAIR     Social History   Occupational History  . Not on file  Tobacco Use  . Smoking status: Former Smoker    Packs/day: 0.50    Years: 10.00    Pack years: 5.00  . Smokeless tobacco: Never Used  Substance and Sexual Activity  . Alcohol use: No  . Drug use: No  . Sexual activity: Not on file

## 2018-08-28 DIAGNOSIS — K921 Melena: Secondary | ICD-10-CM | POA: Diagnosis not present

## 2018-08-28 DIAGNOSIS — Z7901 Long term (current) use of anticoagulants: Secondary | ICD-10-CM | POA: Diagnosis not present

## 2018-09-04 DIAGNOSIS — K209 Esophagitis, unspecified: Secondary | ICD-10-CM | POA: Diagnosis not present

## 2018-09-04 DIAGNOSIS — K3189 Other diseases of stomach and duodenum: Secondary | ICD-10-CM | POA: Diagnosis not present

## 2018-09-04 DIAGNOSIS — D131 Benign neoplasm of stomach: Secondary | ICD-10-CM | POA: Diagnosis not present

## 2018-09-04 DIAGNOSIS — K921 Melena: Secondary | ICD-10-CM | POA: Diagnosis not present

## 2018-09-24 ENCOUNTER — Encounter (INDEPENDENT_AMBULATORY_CARE_PROVIDER_SITE_OTHER): Payer: Self-pay | Admitting: Orthopaedic Surgery

## 2018-09-24 ENCOUNTER — Ambulatory Visit (INDEPENDENT_AMBULATORY_CARE_PROVIDER_SITE_OTHER): Payer: Medicare Other | Admitting: Orthopaedic Surgery

## 2018-09-24 DIAGNOSIS — M1711 Unilateral primary osteoarthritis, right knee: Secondary | ICD-10-CM | POA: Diagnosis not present

## 2018-09-24 MED ORDER — METHYLPREDNISOLONE ACETATE 40 MG/ML IJ SUSP
40.0000 mg | INTRAMUSCULAR | Status: AC | PRN
  Administered 2018-09-24: 40 mg via INTRA_ARTICULAR

## 2018-09-24 MED ORDER — HYLAN G-F 20 48 MG/6ML IX SOSY
48.0000 mg | PREFILLED_SYRINGE | INTRA_ARTICULAR | Status: AC | PRN
  Administered 2018-09-24: 48 mg via INTRA_ARTICULAR

## 2018-09-24 MED ORDER — LIDOCAINE HCL 1 % IJ SOLN
3.0000 mL | INTRAMUSCULAR | Status: AC | PRN
  Administered 2018-09-24: 3 mL

## 2018-09-24 NOTE — Progress Notes (Signed)
   Procedure Note  Patient: [name] [name]             Date of Birth: 1951-03-18           MRN: [id number]             Visit Date: 09/24/2018 HPI: [name] [name] returns today requesting a supplemental injection in his right knee.  He had Orthovisc on 08/02/2018 with Dr. Paulla Fore states that the single shots of the supplemental injection worked for him better in the past and is requesting supplemental injection in the right knee today.  He states that the cortisone really did not help his pain in the knee on the right and raised his glucose levels.  Physical exam: Right knee no effusion abnormal warmth erythema he lacks about 10 degrees of full extension flexes to beyond 90 degrees.  No instability valgus varus stressing.  Procedures: Visit Diagnoses: Unilateral primary osteoarthritis, right knee  Large Joint Inj: R knee on 09/24/2018 11:51 AM Indications: pain Details: 22 G 1.5 in needle, anterolateral approach  Arthrogram: No  Medications: 3 mL lidocaine 1 %; 40 mg methylPREDNISolone acetate 40 MG/ML; 48 mg Hylan 48 MG/6ML Outcome: tolerated well, no immediate complications Procedure, treatment alternatives, risks and benefits explained, specific risks discussed. Consent was given by the patient. Immediately prior to procedure a time out was called to verify the correct patient, procedure, equipment, support staff and site/side marked as required. Patient was prepped and draped in the usual sterile fashion.     Plan: Patient will use Voltaren gel which she has at home 4 g up to 4 times daily to the right knee.  He wished to pay for a supplemental injection out-of-pocket today and therefore it was supplied.  He also is considering having a right total knee replacement after the first year he is given Samella Parr card.  Like to see him back after the first of the year 2 discuss total knee arthroplasty with him at that time.  Questions were encouraged and answered by Dr. Ninfa Linden myself  today.

## 2018-09-29 ENCOUNTER — Encounter: Payer: Self-pay | Admitting: Sports Medicine

## 2018-09-29 NOTE — Assessment & Plan Note (Signed)
Repeat injection performed today.  We did discuss consultation with orthopedics for total knee arthroplasty and she did this time is most likely definitive treatment given the ongoing pain and symptoms.   >50% of this 25 minute visit spent in direct patient counseling and/or coordination of care.  Discussion was focused on education regarding the in discussing the pathoetiology and anticipated clinical course of the above condition.  Ultimately he will likely require brief stay at skilled facility given lack of support at home due to his wife's recent passing.  I am happy to see him back at any time for repeat injections after 10 week.  He does understand that repeat injections would like a not going to continue to provide him with benefit desired.  Patient aware minimal time between injection and surgery is 6 weeks.

## 2018-10-08 DIAGNOSIS — K3189 Other diseases of stomach and duodenum: Secondary | ICD-10-CM | POA: Diagnosis not present

## 2018-10-08 DIAGNOSIS — Z7901 Long term (current) use of anticoagulants: Secondary | ICD-10-CM | POA: Diagnosis not present

## 2018-10-08 DIAGNOSIS — K2210 Ulcer of esophagus without bleeding: Secondary | ICD-10-CM | POA: Diagnosis not present

## 2018-10-08 DIAGNOSIS — Z791 Long term (current) use of non-steroidal anti-inflammatories (NSAID): Secondary | ICD-10-CM | POA: Diagnosis not present

## 2018-10-11 ENCOUNTER — Ambulatory Visit: Payer: Medicare Other | Admitting: Sports Medicine

## 2018-10-14 DIAGNOSIS — Z79899 Other long term (current) drug therapy: Secondary | ICD-10-CM | POA: Diagnosis not present

## 2018-10-14 DIAGNOSIS — K279 Peptic ulcer, site unspecified, unspecified as acute or chronic, without hemorrhage or perforation: Secondary | ICD-10-CM | POA: Diagnosis not present

## 2018-10-14 DIAGNOSIS — E119 Type 2 diabetes mellitus without complications: Secondary | ICD-10-CM | POA: Diagnosis not present

## 2018-10-19 ENCOUNTER — Encounter (HOSPITAL_COMMUNITY): Payer: Self-pay | Admitting: Emergency Medicine

## 2018-10-19 ENCOUNTER — Emergency Department (HOSPITAL_COMMUNITY)
Admission: EM | Admit: 2018-10-19 | Discharge: 2018-10-19 | Disposition: A | Discharge status: Home / Self Care | Payer: Medicare Other | Attending: Emergency Medicine | Admitting: Emergency Medicine

## 2018-10-19 DIAGNOSIS — M25561 Pain in right knee: Secondary | ICD-10-CM | POA: Diagnosis not present

## 2018-10-19 DIAGNOSIS — Z87891 Personal history of nicotine dependence: Secondary | ICD-10-CM | POA: Insufficient documentation

## 2018-10-19 DIAGNOSIS — Z7984 Long term (current) use of oral hypoglycemic drugs: Secondary | ICD-10-CM | POA: Insufficient documentation

## 2018-10-19 DIAGNOSIS — G8929 Other chronic pain: Secondary | ICD-10-CM | POA: Insufficient documentation

## 2018-10-19 DIAGNOSIS — I2510 Atherosclerotic heart disease of native coronary artery without angina pectoris: Secondary | ICD-10-CM | POA: Diagnosis not present

## 2018-10-19 DIAGNOSIS — I10 Essential (primary) hypertension: Secondary | ICD-10-CM | POA: Insufficient documentation

## 2018-10-19 DIAGNOSIS — E119 Type 2 diabetes mellitus without complications: Secondary | ICD-10-CM | POA: Insufficient documentation

## 2018-10-19 DIAGNOSIS — Z7982 Long term (current) use of aspirin: Secondary | ICD-10-CM | POA: Diagnosis not present

## 2018-10-19 MED ORDER — OXYCODONE-ACETAMINOPHEN 5-325 MG PO TABS: 1.0000 | ORAL_TABLET | ORAL | 0 refills | Status: DC | PRN

## 2018-10-19 MED ORDER — OXYCODONE-ACETAMINOPHEN 5-325 MG PO TABS
2.0000 | ORAL_TABLET | Freq: Once | ORAL | Status: AC
  Administered 2018-10-19: 2 via ORAL
  Filled 2018-10-19: qty 2

## 2018-10-19 MED ORDER — METHOCARBAMOL 500 MG PO TABS: 500.0000 mg | ORAL_TABLET | Freq: Two times a day (BID) | ORAL | 0 refills | Status: DC

## 2018-10-19 NOTE — ED Notes (Signed)
ED Provider at bedside. 

## 2018-10-19 NOTE — ED Triage Notes (Signed)
Patient here from home with complaints of right knee pain. Denies injury. Reports that he is suppose to have knee replacement on Jan 10, but pain is unbearable. Tramadol and Tylenol with no relief.

## 2018-10-19 NOTE — ED Provider Notes (Signed)
[facility] DEPT Provider Note   CSN: [id number] Arrival date & time: 10/19/18  0827     History   Chief Complaint Chief Complaint  Patient presents with  . Knee Pain    HPI [name] [name] is a 67 y.o. male.  67 year old male with history of severe right knee osteoarthritis presents with worsening pain times several days.  Pain characterizes sharp and worse with standing.  Denies any trauma.  No fever chills.  No new joint edema.  No distal numbness or tingling.  Has been using his Ultram without relief.  He is scheduled for surgery in about a month.     Past Medical History:  Diagnosis Date  . 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 DM (diabetes mellitus) ([place])   . HTN (hypertension)   . Hyperlipidemia     Patient Active Problem List   Diagnosis Date Noted  . Bilateral leg edema 05/04/2017  . Primary osteoarthritis of both knees 03/05/2017  . Type II diabetes mellitus with manifestations ([place]) 08/18/2016  . CAP (community acquired pneumonia) 08/17/2016  . Right carotid bruit 08/05/2014  . HYPERLIPIDEMIA-MIXED 06/15/2009  . HYPERTENSION, BENIGN 06/15/2009  . CAD, NATIVE VESSEL 06/15/2009    Past Surgical History:  Procedure Laterality Date  . CORONARY ANGIOPLASTY WITH STENT PLACEMENT    . LAPAROSCOPIC INGUINAL HERNIA REPAIR          Home Medications    Prior to Admission medications   Medication Sig Start Date End Date Taking? Authorizing Provider  ALPRAZolam (XANAX) 0.25 MG tablet Take 0.25 mg by mouth. 11/27/16   [provider]  aspirin 81 MG tablet Take 81 mg by mouth daily.      [provider]  CIALIS 20 MG tablet  02/06/17   [provider]  clopidogrel (PLAVIX) 75 MG tablet Take 1 tablet (75 mg total) by mouth daily. 07/15/12   Bensimhon, Shaune Pascal, MD  COLCRYS 0.6 MG tablet Take 1 tablet (0.6 mg  total) by mouth 2 (two) times daily. 08/22/18   Gerda Diss, DO  cyanocobalamin (,VITAMIN B-12,) 1000 MCG/ML injection Inject 1,000 mcg into the muscle every 14 (fourteen) days. 05/12/16   [provider]  diclofenac sodium (VOLTAREN) 1 % GEL Apply topically to affected area qid 08/02/18   Gerda Diss, DO  FREESTYLE INSULINX TEST test strip  01/12/17   [provider]  glipiZIDE (GLUCOTROL) 10 MG tablet Take 10 mg by mouth daily.      [provider]  insulin glargine (LANTUS) 100 UNIT/ML injection Inject 0.18 mLs (18 Units total) into the skin at bedtime. 08/20/16 03/05/17  Tomma Rakers, MD  Lancets (FREESTYLE) lancets  01/04/17   [provider]  levalbuterol Penne Lash HFA) 45 MCG/ACT inhaler Inhale into the lungs every 4 (four) hours as needed for wheezing.    [provider]  metoprolol succinate (TOPROL-XL) 25 MG 24 hr tablet Take 12.5 mg by mouth daily.    [provider]  NOVOFINE 30G X 8 MM MISC  12/08/16   [provider]  rosuvastatin (CRESTOR) 20 MG tablet Take 20 mg by mouth daily.      [provider]  telmisartan (MICARDIS) 80 MG tablet  02/13/17   [provider]  traMADol (ULTRAM) 50 MG tablet Take 1 tablet (50 mg total) by mouth every 6 (  six) hours as needed for moderate pain. 08/21/18   Gerda Diss, DO    Family History Family History  Problem Relation Age of Onset  . Hypertension Mother   . Cancer Mother   . Heart attack Father   . Hypertension Father   . Diabetes Father   . Hypertension Brother   . Irregular heart beat Brother     Social History Social History   Tobacco Use  . Smoking status: Former Smoker    Packs/day: 0.50    Years: 10.00    Pack years: 5.00  . Smokeless tobacco: Never Used  Substance Use Topics  . Alcohol use: No  . Drug use: No     Allergies   Allopurinol; Aripiprazole; Bupropion; Buspirone; Desvenlafaxine; Duloxetine; Escitalopram; Fish  oil; Fluticasone furoate; Hydroxyzine hcl; Nortriptyline; Paroxetine hcl; Penicillins; Ramipril; Sertraline; Venlafaxine; Vilazodone; and Amoxicillin   Review of Systems Review of Systems  All other systems reviewed and are negative.    Physical Exam Updated Vital Signs BP 122/68 (BP Location: Left Arm)   Pulse 78   Temp 98 F (36.7 C) (Oral)   Resp 19   SpO2 99%   Physical Exam  Constitutional: He is oriented to person, place, and time. He appears well-developed and well-nourished.  Non-toxic appearance. No distress.  HENT:  Head: Normocephalic and atraumatic.  Eyes: Pupils are equal, round, and reactive to light. Conjunctivae, EOM and lids are normal.  Neck: Normal range of motion. Neck supple. No tracheal deviation present. No thyroid mass present.  Cardiovascular: Normal rate, regular rhythm and normal heart sounds. Exam reveals no gallop.  No murmur heard. Pulmonary/Chest: Effort normal and breath sounds normal. No stridor. No respiratory distress. He has no decreased breath sounds. He has no wheezes. He has no rhonchi. He has no rales.  Abdominal: Soft. Normal appearance and bowel sounds are normal. He exhibits no distension. There is no tenderness. There is no rebound and no CVA tenderness.  Musculoskeletal: He exhibits no edema or tenderness.       Right knee: He exhibits decreased range of motion. He exhibits no effusion and no erythema.  Neurological: He is alert and oriented to person, place, and time. He has normal strength. No cranial nerve deficit or sensory deficit. GCS eye subscore is 4. GCS verbal subscore is 5. GCS motor subscore is 6.  Skin: Skin is warm and dry. No abrasion and no rash noted.  Psychiatric: He has a normal mood and affect. His speech is normal and behavior is normal.  Nursing note and vitals reviewed.    ED Treatments / Results  Labs (all labs ordered are listed, but only abnormal results are displayed) Labs Reviewed - No data to  display  EKG None  Radiology No results found.  Procedures Procedures (including critical care time)  Medications Ordered in ED Medications  oxyCODONE-acetaminophen (PERCOCET/ROXICET) 5-325 MG per tablet 2 tablet (has no administration in time range)     Initial Impression / Assessment and Plan / ED Course  I have reviewed the triage vital signs and the nursing notes.  Pertinent labs & imaging results that were available during my care of the patient were reviewed by me and considered in my medical decision making (see chart for details).     Patient medicated Percocet and given crutches and will follow with his doctor  Final Clinical Impressions(s) / ED Diagnoses   Final diagnoses:  None    ED Discharge Orders    None  Lacretia Leigh, MD 10/19/18 [phone]

## 2018-10-21 ENCOUNTER — Telehealth (INDEPENDENT_AMBULATORY_CARE_PROVIDER_SITE_OTHER): Payer: Self-pay | Admitting: Orthopaedic Surgery

## 2018-10-21 NOTE — Telephone Encounter (Signed)
FYI

## 2018-10-21 NOTE — Telephone Encounter (Signed)
Patient called to let Dr. Ninfa Linden know that he had to go to the ER on 10/19/2018 and was prescribed Oxycodone and Robaxin.  CB#[phone].  Thank you.

## 2018-10-22 DIAGNOSIS — K259 Gastric ulcer, unspecified as acute or chronic, without hemorrhage or perforation: Secondary | ICD-10-CM | POA: Diagnosis not present

## 2018-10-22 DIAGNOSIS — K3189 Other diseases of stomach and duodenum: Secondary | ICD-10-CM | POA: Diagnosis not present

## 2018-10-22 DIAGNOSIS — K2970 Gastritis, unspecified, without bleeding: Secondary | ICD-10-CM | POA: Diagnosis not present

## 2018-11-04 ENCOUNTER — Emergency Department (HOSPITAL_COMMUNITY)
Admission: EM | Admit: 2018-11-04 | Discharge: 2018-11-04 | Disposition: A | Discharge status: Home / Self Care | Payer: Medicare Other | Attending: Emergency Medicine | Admitting: Emergency Medicine

## 2018-11-04 ENCOUNTER — Other Ambulatory Visit: Payer: Self-pay

## 2018-11-04 ENCOUNTER — Encounter (HOSPITAL_COMMUNITY): Payer: Self-pay | Admitting: Obstetrics and Gynecology

## 2018-11-04 ENCOUNTER — Emergency Department (HOSPITAL_COMMUNITY): Payer: Medicare Other

## 2018-11-04 DIAGNOSIS — I2510 Atherosclerotic heart disease of native coronary artery without angina pectoris: Secondary | ICD-10-CM | POA: Insufficient documentation

## 2018-11-04 DIAGNOSIS — E119 Type 2 diabetes mellitus without complications: Secondary | ICD-10-CM | POA: Insufficient documentation

## 2018-11-04 DIAGNOSIS — I10 Essential (primary) hypertension: Secondary | ICD-10-CM | POA: Insufficient documentation

## 2018-11-04 DIAGNOSIS — S40012A Contusion of left shoulder, initial encounter: Secondary | ICD-10-CM

## 2018-11-04 DIAGNOSIS — R52 Pain, unspecified: Secondary | ICD-10-CM | POA: Diagnosis not present

## 2018-11-04 DIAGNOSIS — Y9389 Activity, other specified: Secondary | ICD-10-CM | POA: Insufficient documentation

## 2018-11-04 DIAGNOSIS — W108XXA Fall (on) (from) other stairs and steps, initial encounter: Secondary | ICD-10-CM | POA: Diagnosis not present

## 2018-11-04 DIAGNOSIS — M25519 Pain in unspecified shoulder: Secondary | ICD-10-CM | POA: Diagnosis not present

## 2018-11-04 DIAGNOSIS — W19XXXA Unspecified fall, initial encounter: Secondary | ICD-10-CM | POA: Diagnosis not present

## 2018-11-04 DIAGNOSIS — S4992XA Unspecified injury of left shoulder and upper arm, initial encounter: Secondary | ICD-10-CM | POA: Diagnosis not present

## 2018-11-04 DIAGNOSIS — Z7902 Long term (current) use of antithrombotics/antiplatelets: Secondary | ICD-10-CM | POA: Diagnosis not present

## 2018-11-04 DIAGNOSIS — S8001XA Contusion of right knee, initial encounter: Secondary | ICD-10-CM

## 2018-11-04 DIAGNOSIS — M25512 Pain in left shoulder: Secondary | ICD-10-CM | POA: Diagnosis not present

## 2018-11-04 DIAGNOSIS — Z7982 Long term (current) use of aspirin: Secondary | ICD-10-CM | POA: Insufficient documentation

## 2018-11-04 DIAGNOSIS — S0990XA Unspecified injury of head, initial encounter: Secondary | ICD-10-CM | POA: Diagnosis not present

## 2018-11-04 DIAGNOSIS — S8991XA Unspecified injury of right lower leg, initial encounter: Secondary | ICD-10-CM | POA: Diagnosis not present

## 2018-11-04 DIAGNOSIS — Y999 Unspecified external cause status: Secondary | ICD-10-CM | POA: Diagnosis not present

## 2018-11-04 DIAGNOSIS — Y92015 Private garage of single-family (private) house as the place of occurrence of the external cause: Secondary | ICD-10-CM | POA: Diagnosis not present

## 2018-11-04 DIAGNOSIS — Z79899 Other long term (current) drug therapy: Secondary | ICD-10-CM | POA: Diagnosis not present

## 2018-11-04 DIAGNOSIS — R609 Edema, unspecified: Secondary | ICD-10-CM | POA: Diagnosis not present

## 2018-11-04 DIAGNOSIS — Z794 Long term (current) use of insulin: Secondary | ICD-10-CM | POA: Diagnosis not present

## 2018-11-04 DIAGNOSIS — M25561 Pain in right knee: Secondary | ICD-10-CM | POA: Diagnosis not present

## 2018-11-04 MED ORDER — HYDROCODONE-ACETAMINOPHEN 5-325 MG PO TABS
1.0000 | ORAL_TABLET | Freq: Once | ORAL | Status: AC
  Administered 2018-11-04: 1 via ORAL
  Filled 2018-11-04: qty 1

## 2018-11-04 MED ORDER — HYDROCODONE-ACETAMINOPHEN 5-325 MG PO TABS: 1.0000 | ORAL_TABLET | Freq: Four times a day (QID) | ORAL | 0 refills | Status: DC | PRN

## 2018-11-04 NOTE — ED Provider Notes (Signed)
[facility] DEPT Provider Note   CSN: [id number] Arrival date & time: 11/04/18  1615     History   Chief Complaint Chief Complaint  Patient presents with  . Fall    HPI [name] [name] is a 67 y.o. male with a past medical history of hypertension, primary osteoarthritis of bilateral knees, CAD currently anticoagulated on Plavix who presents to ED for mechanical fall that occurred prior to arrival.  States that he was walking down steps when he missed a step in his garage.  States that he hit his right hand and right knee on the car and then landed directly on his left shoulder.  Denies any head injury or loss of consciousness.  States that his son helped him up when he arrived at his home and sat him down in a chair.  He tried to put weight on his leg but had pain associated with it.  He took a hydrocodone prior to arrival.  Denies any headache, vision changes, numbness in arms or legs, neck pain.  Patient was scheduled for a knee replacement surgery in 11 days.  HPI  Past Medical History:  Diagnosis Date  . 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 DM (diabetes mellitus) ([place])   . HTN (hypertension)   . Hyperlipidemia     Patient Active Problem List   Diagnosis Date Noted  . Bilateral leg edema 05/04/2017  . Primary osteoarthritis of both knees 03/05/2017  . Type II diabetes mellitus with manifestations ([place]) 08/18/2016  . CAP (community acquired pneumonia) 08/17/2016  . Right carotid bruit 08/05/2014  . HYPERLIPIDEMIA-MIXED 06/15/2009  . HYPERTENSION, BENIGN 06/15/2009  . CAD, NATIVE VESSEL 06/15/2009    Past Surgical History:  Procedure Laterality Date  . CORONARY ANGIOPLASTY WITH STENT PLACEMENT    . LAPAROSCOPIC INGUINAL HERNIA REPAIR          Home Medications    Prior to Admission medications   Medication Sig Start Date End Date Taking?  Authorizing Provider  ALPRAZolam Duanne Moron) 0.25 MG tablet Take 0.25 mg by mouth at bedtime as needed for anxiety.  11/27/16   [provider]  aspirin 81 MG tablet Take 81 mg by mouth at bedtime.     [provider]  CIALIS 20 MG tablet Take 20 mg by mouth daily as needed for erectile dysfunction.  02/06/17   [provider]  clopidogrel (PLAVIX) 75 MG tablet Take 1 tablet (75 mg total) by mouth daily. Patient taking differently: Take 75 mg by mouth at bedtime.  07/15/12   Bensimhon, Shaune Pascal, MD  COLCRYS 0.6 MG tablet Take 1 tablet (0.6 mg total) by mouth 2 (two) times daily. 08/22/18   Gerda Diss, DO  cyanocobalamin (,VITAMIN B-12,) 1000 MCG/ML injection Inject 1,000 mcg into the muscle every 14 (fourteen) days. 05/12/16   [provider]  diclofenac sodium (VOLTAREN) 1 % GEL Apply topically to affected area qid 08/02/18   Gerda Diss, DO  FREESTYLE INSULINX TEST test strip  01/12/17   [provider]  glipiZIDE (GLUCOTROL XL) 10 MG 24 hr tablet Take 10 mg by mouth at bedtime.    [provider]  HYDROcodone-acetaminophen (NORCO/VICODIN) 5-325 MG tablet Take 1 tablet by mouth every 6 (six) hours as needed. 11/04/18   Cuinn Westerhold, PA-C  insulin glargine (LANTUS) 100 UNIT/ML injection Inject 0.18 mLs (  18 Units total) into the skin at bedtime. Patient taking differently: Inject 19 Units into the skin at bedtime.  08/20/16 11/04/18  Tomma Rakers, MD  Lancets (FREESTYLE) lancets  01/04/17   [provider]  levalbuterol Penne Lash HFA) 45 MCG/ACT inhaler Inhale 1 puff into the lungs every 4 (four) hours as needed for wheezing.     [provider]  methocarbamol (ROBAXIN) 500 MG tablet Take 1 tablet (500 mg total) by mouth 2 (two) times daily. Patient not taking: Reported on 11/04/2018 10/19/18   Lacretia Leigh, MD  metoprolol succinate (TOPROL-XL) 25 MG 24 hr tablet Take 12.5 mg by mouth daily.    [provider]  NOVOFINE 30G X 8 MM MISC  12/08/16   [provider]  oxyCODONE-acetaminophen (PERCOCET/ROXICET) 5-325 MG tablet Take 1-2 tablets by mouth every 4 (four) hours as needed for moderate pain or severe pain. Patient not taking: Reported on 11/04/2018 10/19/18   Lacretia Leigh, MD  rosuvastatin (CRESTOR) 20 MG tablet Take 20 mg by mouth at bedtime.     [provider]  sertraline (ZOLOFT) 25 MG tablet Take 12.5 mg by mouth at bedtime.    [provider]  telmisartan (MICARDIS) 80 MG tablet Take 80 mg by mouth daily.  02/13/17   [provider]  traMADol (ULTRAM) 50 MG tablet Take 1 tablet (50 mg total) by mouth every 6 (six) hours as needed for moderate pain. Patient not taking: Reported on 11/04/2018 08/21/18   Gerda Diss, DO    Family History Family History  Problem Relation Age of Onset  . Hypertension Mother   . Cancer Mother   . Heart attack Father   . Hypertension Father   . Diabetes Father   . Hypertension Brother   . Irregular heart beat Brother     Social History Social History   Tobacco Use  . Smoking status: Former Smoker    Packs/day: 0.50    Years: 10.00    Pack years: 5.00  . Smokeless tobacco: Never Used  Substance Use Topics  . Alcohol use: No  . Drug use: No     Allergies   Allopurinol; Aripiprazole; Bupropion; Buspirone; Desvenlafaxine; Duloxetine; Escitalopram; Fish oil; Fluticasone furoate; Hydroxyzine hcl; Nortriptyline; Paroxetine hcl; Ramipril; Sertraline; Venlafaxine; and Vilazodone   Review of Systems Review of Systems  Constitutional: Negative for appetite change, chills and fever.  HENT: Negative for ear pain, rhinorrhea, sneezing and sore throat.   Eyes: Negative for photophobia and visual disturbance.  Respiratory: Negative for cough, chest tightness, shortness of breath and wheezing.   Cardiovascular: Negative for chest pain and palpitations.  Gastrointestinal: Negative for abdominal pain, blood in  stool, constipation, diarrhea, nausea and vomiting.  Genitourinary: Negative for dysuria, hematuria and urgency.  Musculoskeletal: Positive for arthralgias. Negative for myalgias.  Skin: Negative for rash.  Neurological: Negative for dizziness, weakness and light-headedness.     Physical Exam Updated Vital Signs BP (!) 145/97 (BP Location: Left Arm)   Pulse 83   Temp 98.4 F (36.9 C) (Oral)   Resp 18   SpO2 96%   Physical Exam Vitals signs and nursing note reviewed.  Constitutional:      General: He is not in acute distress.    Appearance: He is well-developed.  HENT:     Head: Normocephalic and atraumatic.     Nose: Nose normal.  Eyes:     General: No scleral icterus.       Left eye: No discharge.  Conjunctiva/sclera: Conjunctivae normal.  Neck:     Musculoskeletal: Normal range of motion and neck supple.     Comments: Full active and passive range of motion of neck without difficulty. Cardiovascular:     Rate and Rhythm: Normal rate and regular rhythm.     Heart sounds: Normal heart sounds. No murmur. No friction rub. No gallop.   Pulmonary:     Effort: Pulmonary effort is normal. No respiratory distress.     Breath sounds: Normal breath sounds.  Abdominal:     General: Bowel sounds are normal. There is no distension.     Palpations: Abdomen is soft.     Tenderness: There is no abdominal tenderness. There is no guarding.  Musculoskeletal: Normal range of motion.        General: Tenderness present.     Comments: Tenderness to palpation of the right lateral knee.  Pain with flexion and extension.  Able to perform straight leg raise.  No edema, erythema or other skin changes noted.  2+ DP pulse palpated. Tenderness to palpation of the anterior left shoulder.  Able to perform full active and passive range of motion without difficulty.  Skin:    General: Skin is warm and dry.     Findings: No rash.  Neurological:     Mental Status: He is alert and oriented to person,  place, and time.     Cranial Nerves: No cranial nerve deficit.     Sensory: No sensory deficit.     Motor: No abnormal muscle tone.     Coordination: Coordination normal.     Comments: Pupils reactive. No facial asymmetry noted. Cranial nerves appear grossly intact. Sensation intact to light touch on face, BUE and BLE.       ED Treatments / Results  Labs (all labs ordered are listed, but only abnormal results are displayed) Labs Reviewed - No data to display  EKG None  Radiology Ct Head Wo Contrast  Result Date: 11/04/2018 CLINICAL DATA:  [place] today. Taking Plavix. EXAM: CT HEAD WITHOUT CONTRAST TECHNIQUE: Contiguous axial images were obtained from the base of the skull through the vertex without intravenous contrast. COMPARISON:  None. FINDINGS: Brain: Mild-to-moderate diffuse enlargement of the ventricles and subarachnoid spaces. Minimal patchy white matter low density in both cerebral hemispheres. No intracranial hemorrhage, mass lesion or CT evidence of acute infarction. Vascular: No hyperdense vessel or unexpected calcification. Skull: Normal. Negative for fracture or focal lesion. Sinuses/Orbits: No acute finding. Other: None. IMPRESSION: 1. No acute abnormality. 2. Mild to moderate diffuse cerebral and cerebellar atrophy. 3. Minimal chronic small vessel white matter ischemic changes in both cerebral hemispheres. Electronically Signed   By: Claudie Revering M.D.   On: 11/04/2018 18:53   Dg Shoulder Left  Result Date: 11/04/2018 CLINICAL DATA:  Left shoulder pain following a fall today. EXAM: LEFT SHOULDER - 2+ VIEW COMPARISON:  None. FINDINGS: Mild to moderate acromioclavicular spur formation. No fracture or dislocation seen. IMPRESSION: 1. No fracture or dislocation. 2. Mild to moderate AC joint degenerative changes. Electronically Signed   By: Claudie Revering M.D.   On: 11/04/2018 18:50   Dg Knee Complete 4 Views Right  Result Date: 11/04/2018 CLINICAL DATA:  Right knee pain  following a fall today. Scheduled for a right knee replacement later this month. EXAM: RIGHT KNEE - COMPLETE 4+ VIEW COMPARISON:  08/22/2018. FINDINGS: Again demonstrated is marked medial joint space narrowing and associated spur formation. Lateral and patellofemoral spur formation is again demonstrated. Lateral meniscal  calcifications are again demonstrated as well as periarticular calcifications suggest synovial chondromatosis. No fracture, dislocation or effusion. IMPRESSION: 1. No fracture or effusion. 2. 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 Electronically Signed   By: Claudie Revering M.D.   On: 11/04/2018 18:50    Procedures Procedures (including critical care time)  Medications Ordered in ED Medications  HYDROcodone-acetaminophen (NORCO/VICODIN) 5-325 MG per tablet 1 tablet (1 tablet Oral Given 11/04/18 1906)     Initial Impression / Assessment and Plan / ED Course  I have reviewed the triage vital signs and the nursing notes.  Pertinent labs & imaging results that were available during my care of the patient were reviewed by me and considered in my medical decision making (see chart for details).     67 year old male presents to ED for mechanical fall that occurred prior to arrival.  States that he lost his footing while going downstairs, hit his knee on his car in the garage and then fell on his left shoulder.  He is scheduled for knee replacement on 11/15/2018 by Dr. Ninfa Linden.  He has had pain in the knee that has worsened since his fall.  He takes hydrocodone not at home for pain as prescribed previously.  On exam there is tenderness to palpation of the right knee and pain with flexion and extension.  He was able to flex knee and complete straight leg raise.  Tenderness palpation of the left shoulder with no changes to range of motion.  Area is neurovascularly intact.  No deficits on neurological exam.  Imaging here is unremarkable.  X-ray of  the knee shows redemonstration of his primary osteoarthritis.  CT head completed due to his injury and being anticoagulated.  CT negative. Will have patient follow-up with Dr. Ninfa Linden and continue rice therapy and shoulder range of motion exercises.  Will give short course of pain medication. Doubt infectious or vascular cause of symptoms. [place] PMP queried with no discrepancies.  Patient is hemodynamically stable, in NAD, and able to ambulate in the ED. Evaluation does not show pathology that would require ongoing emergent intervention or inpatient treatment. I explained the diagnosis to the patient. Pain has been managed and has no complaints prior to discharge. Patient is comfortable with above plan and is stable for discharge at this time. All questions were answered prior to disposition. Strict return precautions for returning to the ED were discussed. Encouraged follow up with PCP.    Portions of this note were generated with [employer]. Dictation errors may occur despite best attempts at proofreading.   Final Clinical Impressions(s) / ED Diagnoses   Final diagnoses:  Contusion of right knee, initial encounter  Contusion of left shoulder, initial encounter    ED Discharge Orders         Ordered    HYDROcodone-acetaminophen (NORCO/VICODIN) 5-325 MG tablet  Every 6 hours PRN     11/04/18 2035           Delia Heady, PA-C 11/04/18 2038    Davonna Belling, MD 11/05/18 0005

## 2018-11-04 NOTE — ED Notes (Signed)
Bed: [facility] Expected date:  Expected time:  Means of arrival:  Comments: EMS fall blood thinner

## 2018-11-04 NOTE — Discharge Instructions (Signed)
Return to ED for worsening symptoms, additional injuries or falls, numbness in arms or legs, headache or vision changes.

## 2018-11-04 NOTE — ED Triage Notes (Signed)
Pt has a fall today and fell on his left shoulder and right knee. Pt is reportedly having a knee replacement on that right knee this month.  Pt reports he did not hit his head and did not have any LOC. Pt reports he is on plavix. Pt reportedly fell in his garage.  Pt is alert and oriented x4.

## 2018-11-05 ENCOUNTER — Other Ambulatory Visit (INDEPENDENT_AMBULATORY_CARE_PROVIDER_SITE_OTHER): Payer: Self-pay | Admitting: Physician Assistant

## 2018-11-07 NOTE — Patient Instructions (Addendum)
DIANNA DESHLER  11/07/2018   Your procedure is scheduled on: 11-15-18    Report to [facility] Main  Entrance    Report to Admitting at 9:45 AM    Call this number if you have problems the morning of surgery [phone]    Remember: Do not eat food or drink liquids :After Midnight.    BRUSH YOUR TEETH MORNING OF SURGERY AND RINSE YOUR MOUTH OUT, NO CHEWING GUM CANDY OR MINTS.     Take these medicines the morning of surgery with A SIP OF WATER: Metoprolol Succinate (Toprol-XL)                                You may not have any metal on your body including hair pins and             piercings  Do not wear jewelry, make-up, lotions, powders or perfumes, deodorant             Men may shave face and neck.   Do not bring valuables to the hospital. [place].  Contacts, dentures or bridgework may not be worn into surgery.  Leave suitcase in the car. After surgery it may be brought to your room.    Special Instructions: N/A              Please read over the following fact sheets you were given: _____________________________________________________________________  How to Manage Your Diabetes Before and After Surgery  Why is it important to control my blood sugar before and after surgery? . Improving blood sugar levels before and after surgery helps healing and can limit problems. . A way of improving blood sugar control is eating a healthy diet by: o  Eating less sugar and carbohydrates o  Increasing activity/exercise o  Talking with your doctor about reaching your blood sugar goals . High blood sugars (greater than 180 mg/dL) can raise your risk of infections and slow your recovery, so you will need to focus on controlling your diabetes during the weeks before surgery. . Make sure that the doctor who takes care of your diabetes knows about your planned surgery including the date and location.  How do  I manage my blood sugar before surgery? . Check your blood sugar at least 4 times a day, starting 2 days before surgery, to make sure that the level is not too high or low. o Check your blood sugar the morning of your surgery when you wake up and every 2 hours until you get to the Short Stay unit. . If your blood sugar is less than 70 mg/dL, you will need to treat for low blood sugar: o Do not take insulin. o Treat a low blood sugar (less than 70 mg/dL) with  cup of clear juice (cranberry or apple), 4 glucose tablets, OR glucose gel. o Recheck blood sugar in 15 minutes after treatment (to make sure it is greater than 70 mg/dL). If your blood sugar is not greater than 70 mg/dL on recheck, call [phone] for further instructions. . Report your blood sugar to the short stay nurse when you get to Short Stay.  . If you are admitted to the hospital after surgery: o Your blood sugar will be checked  by the staff and you will probably be given insulin after surgery (instead of oral diabetes medicines) to make sure you have good blood sugar levels. o The goal for blood sugar control after surgery is 80-180 mg/dL.   WHAT DO I DO ABOUT MY DIABETES MEDICATION?  Marland Kitchen Do not take oral diabetes medicines (pills) the morning of surgery.  . THE DAY BEFORE SURGERY, take donot take your nightime dose of Glipizide. And only take 9 units of your bedtime dose of Lantus       insulin.       Reviewed and Endorsed by [facility] Patient Education Committee, August 2015           [facility] - Preparing for Surgery Before surgery, you can play an important role.  Because skin is not sterile, your skin needs to be as free of germs as possible.  You can reduce the number of germs on your skin by washing with CHG (chlorahexidine gluconate) soap before surgery.  CHG is an antiseptic cleaner which kills germs and bonds with the skin to continue killing germs even after washing. Please DO NOT use if you have an allergy to  CHG or antibacterial soaps.  If your skin becomes reddened/irritated stop using the CHG and inform your nurse when you arrive at Short Stay. Do not shave (including legs and underarms) for at least 48 hours prior to the first CHG shower.  You may shave your face/neck. Please follow these instructions carefully:  1.  Shower with CHG Soap the night before surgery and the  morning of Surgery.  2.  If you choose to wash your hair, wash your hair first as usual with your  normal  shampoo.  3.  After you shampoo, rinse your hair and body thoroughly to remove the  shampoo.                           4.  Use CHG as you would any other liquid soap.  You can apply chg directly  to the skin and wash                       Gently with a scrungie or clean washcloth.  5.  Apply the CHG Soap to your body ONLY FROM THE NECK DOWN.   Do not use on face/ open                           Wound or open sores. Avoid contact with eyes, ears mouth and genitals (private parts).                       Wash face,  Genitals (private parts) with your normal soap.             6.  Wash thoroughly, paying special attention to the area where your surgery  will be performed.  7.  Thoroughly rinse your body with warm water from the neck down.  8.  DO NOT shower/wash with your normal soap after using and rinsing off  the CHG Soap.                9.  Pat yourself dry with a clean towel.            10.  Wear clean pajamas.            11.  Place clean sheets on your bed the night of your first shower and do not  sleep with pets. Day of Surgery : Do not apply any lotions/deodorants the morning of surgery.  Please wear clean clothes to the hospital/surgery center.  FAILURE TO FOLLOW THESE INSTRUCTIONS MAY RESULT IN THE CANCELLATION OF YOUR SURGERY PATIENT SIGNATURE_________________________________  NURSE SIGNATURE__________________________________  ________________________________________________________________________   Adam Phenix  An incentive spirometer is a tool that can help keep your lungs clear and active. This tool measures how well you are filling your lungs with each breath. Taking long deep breaths may help reverse or decrease the chance of developing breathing (pulmonary) problems (especially infection) following:  A long period of time when you are unable to move or be active. BEFORE THE PROCEDURE   If the spirometer includes an indicator to show your best effort, your nurse or respiratory therapist will set it to a desired goal.  If possible, sit up straight or lean slightly forward. Try not to slouch.  Hold the incentive spirometer in an upright position. INSTRUCTIONS FOR USE  1. Sit on the edge of your bed if possible, or sit up as far as you can in bed or on a chair. 2. Hold the incentive spirometer in an upright position. 3. Breathe out normally. 4. Place the mouthpiece in your mouth and seal your lips tightly around it. 5. Breathe in slowly and as deeply as possible, raising the piston or the ball toward the top of the column. 6. Hold your breath for 3-5 seconds or for as long as possible. Allow the piston or ball to fall to the bottom of the column. 7. Remove the mouthpiece from your mouth and breathe out normally. 8. Rest for a few seconds and repeat Steps 1 through 7 at least 10 times every 1-2 hours when you are awake. Take your time and take a few normal breaths between deep breaths. 9. The spirometer may include an indicator to show your best effort. Use the indicator as a goal to work toward during each repetition. 10. After each set of 10 deep breaths, practice coughing to be sure your lungs are clear. If you have an incision (the cut made at the time of surgery), support your incision when coughing by placing a pillow or rolled up towels firmly against it. Once you are able to get out of bed, walk around indoors and cough well. You may stop using the incentive spirometer when  instructed by your caregiver.  RISKS AND COMPLICATIONS  Take your time so you do not get dizzy or light-headed.  If you are in pain, you may need to take or ask for pain medication before doing incentive spirometry. It is harder to take a deep breath if you are having pain. AFTER USE  Rest and breathe slowly and easily.  It can be helpful to keep track of a log of your progress. Your caregiver can provide you with a simple table to help with this. If you are using the spirometer at home, follow these instructions: [place] IF:   You are having difficultly using the spirometer.  You have trouble using the spirometer as often as instructed.  Your pain medication is not giving enough relief while using the spirometer.  You develop fever of 100.5 F (38.1 C) or higher. SEEK IMMEDIATE MEDICAL CARE IF:   You cough up bloody sputum that had not been present before.  You develop fever of 102 F (38.9 C) or greater.  You develop worsening pain at or near the incision site. MAKE SURE YOU:   Understand these instructions.  Will watch your condition.  Will get help right away if you are not doing well or get worse. Document Released: 03/19/2007 Document Revised: 01/29/2012 Document Reviewed: 05/20/2007 ExitCare Patient Information 2014 ExitCare, [place].   ________________________________________________________________________  WHAT IS A BLOOD TRANSFUSION? Blood Transfusion Information  A transfusion is the replacement of blood or some of its parts. Blood is made up of multiple cells which provide different functions.  Red blood cells carry oxygen and are used for blood loss replacement.  White blood cells fight against infection.  Platelets control bleeding.  Plasma helps clot blood.  Other blood products are available for specialized needs, such as hemophilia or other clotting disorders. BEFORE THE TRANSFUSION  Who gives blood for transfusions?   Healthy  volunteers who are fully evaluated to make sure their blood is safe. This is blood bank blood. Transfusion therapy is the safest it has ever been in the practice of medicine. Before blood is taken from a donor, a complete history is taken to make sure that person has no history of diseases nor engages in risky social behavior (examples are intravenous drug use or sexual activity with multiple partners). The donor's travel history is screened to minimize risk of transmitting infections, such as malaria. The donated blood is tested for signs of infectious diseases, such as HIV and hepatitis. The blood is then tested to be sure it is compatible with you in order to minimize the chance of a transfusion reaction. If you or a relative donates blood, this is often done in anticipation of surgery and is not appropriate for emergency situations. It takes many days to process the donated blood. RISKS AND COMPLICATIONS Although transfusion therapy is very safe and saves many lives, the main dangers of transfusion include:   Getting an infectious disease.  Developing a transfusion reaction. This is an allergic reaction to something in the blood you were given. Every precaution is taken to prevent this. The decision to have a blood transfusion has been considered carefully by your caregiver before blood is given. Blood is not given unless the benefits outweigh the risks. AFTER THE TRANSFUSION  Right after receiving a blood transfusion, you will usually feel much better and more energetic. This is especially true if your red blood cells have gotten low (anemic). The transfusion raises the level of the red blood cells which carry oxygen, and this usually causes an energy increase.  The nurse administering the transfusion will monitor you carefully for complications. HOME CARE INSTRUCTIONS  No special instructions are needed after a transfusion. You may find your energy is better. Speak with your caregiver about any  limitations on activity for underlying diseases you may have. SEEK MEDICAL CARE IF:   Your condition is not improving after your transfusion.  You develop redness or irritation at the intravenous (IV) site. SEEK IMMEDIATE MEDICAL CARE IF:  Any of the following symptoms occur over the next 12 hours:  Shaking chills.  You have a temperature by mouth above 102 F (38.9 C), not controlled by medicine.  Chest, back, or muscle pain.  People around you feel you are not acting correctly or are confused.  Shortness of breath or difficulty breathing.  Dizziness and fainting.  You get a rash or develop hives.  You have a decrease in urine output.  Your urine turns a dark color or changes to pink, red, or brown. Any  of the following symptoms occur over the next 10 days:  You have a temperature by mouth above 102 F (38.9 C), not controlled by medicine.  Shortness of breath.  Weakness after normal activity.  The white part of the eye turns yellow (jaundice).  You have a decrease in the amount of urine or are urinating less often.  Your urine turns a dark color or changes to pink, red, or brown. Document Released: 11/03/2000 Document Revised: 01/29/2012 Document Reviewed: 06/22/2008 [facility] Patient Information 2014 [place], [place].  _______________________________________________________________________

## 2018-11-08 ENCOUNTER — Encounter (HOSPITAL_COMMUNITY)
Admission: RE | Admit: 2018-11-08 | Discharge: 2018-11-08 | Disposition: A | Discharge status: Home / Self Care | Payer: Medicare Other | Source: Ambulatory Visit | Attending: Orthopaedic Surgery | Admitting: Orthopaedic Surgery

## 2018-11-08 ENCOUNTER — Other Ambulatory Visit: Payer: Self-pay

## 2018-11-08 ENCOUNTER — Encounter (HOSPITAL_COMMUNITY): Payer: Self-pay

## 2018-11-08 DIAGNOSIS — E119 Type 2 diabetes mellitus without complications: Secondary | ICD-10-CM | POA: Insufficient documentation

## 2018-11-08 DIAGNOSIS — I10 Essential (primary) hypertension: Secondary | ICD-10-CM | POA: Insufficient documentation

## 2018-11-08 DIAGNOSIS — Z01818 Encounter for other preprocedural examination: Secondary | ICD-10-CM | POA: Insufficient documentation

## 2018-11-08 LAB — HEMOGLOBIN A1C
HEMOGLOBIN A1C: 7.2 % — AB (ref 4.8–5.6)
Mean Plasma Glucose: 159.94 mg/dL

## 2018-11-08 LAB — CBC
HCT: 47.1 % (ref 39.0–52.0)
Hemoglobin: 14.6 g/dL (ref 13.0–17.0)
MCH: 28.5 pg (ref 26.0–34.0)
MCHC: 31 g/dL (ref 30.0–36.0)
MCV: 91.8 fL (ref 80.0–100.0)
Platelets: 169 10*3/uL (ref 150–400)
RBC: 5.13 MIL/uL (ref 4.22–5.81)
RDW: 13.2 % (ref 11.5–15.5)
WBC: 7.1 10*3/uL (ref 4.0–10.5)
nRBC: 0 % (ref 0.0–0.2)

## 2018-11-08 LAB — SURGICAL PCR SCREEN
MRSA, PCR: NEGATIVE
Staphylococcus aureus: POSITIVE — AB

## 2018-11-08 LAB — BASIC METABOLIC PANEL
Anion gap: 10 (ref 5–15)
BUN: 20 mg/dL (ref 8–23)
CO2: 30 mmol/L (ref 22–32)
Calcium: 9.4 mg/dL (ref 8.9–10.3)
Chloride: 103 mmol/L (ref 98–111)
Creatinine, Ser: 1.27 mg/dL — ABNORMAL HIGH (ref 0.61–1.24)
GFR calc Af Amer: >60 mL/min (ref >60)
GFR calc non Af Amer: 59 mL/min — ABNORMAL LOW (ref >60)
Glucose, Bld: 120 mg/dL — ABNORMAL HIGH (ref 70–99)
Potassium: 3.9 mmol/L (ref 3.5–5.1)
Sodium: 143 mmol/L (ref 135–145)

## 2018-11-08 LAB — GLUCOSE, CAPILLARY: Glucose-Capillary: 150 mg/dL — ABNORMAL HIGH (ref 70–99)

## 2018-11-11 NOTE — Progress Notes (Addendum)
11-11-18 Abnormal EKG and Cardiac history reviewed with Dr. Lanetta Inch. Pt last seen by cardiologist in 2015 with instructions to follow-up in 2016. No follow-up noted.Marland KitchenMarland KitchenCardiac clearance required prior to surgery. Sherri in Dr. Trevor Mace office made aware.  Per Venida Jarvis, pt is scheduled to see Cardiologist, Dr. Geraldo Pitter on 11-12-18 for Cardiac Clearance.

## 2018-11-12 ENCOUNTER — Encounter: Payer: Self-pay | Admitting: Cardiology

## 2018-11-12 ENCOUNTER — Ambulatory Visit: Payer: Medicare Other | Admitting: Cardiology

## 2018-11-12 ENCOUNTER — Encounter: Payer: Self-pay | Admitting: *Deleted

## 2018-11-12 VITALS — BP 120/62 | HR 64 | Ht 73.0 in | Wt 217.0 lb

## 2018-11-12 DIAGNOSIS — Z01810 Encounter for preprocedural cardiovascular examination: Secondary | ICD-10-CM

## 2018-11-12 DIAGNOSIS — I252 Old myocardial infarction: Secondary | ICD-10-CM

## 2018-11-12 DIAGNOSIS — Z860100 Personal history of colon polyps, unspecified: Secondary | ICD-10-CM

## 2018-11-12 DIAGNOSIS — F329 Major depressive disorder, single episode, unspecified: Secondary | ICD-10-CM | POA: Insufficient documentation

## 2018-11-12 DIAGNOSIS — Z87442 Personal history of urinary calculi: Secondary | ICD-10-CM

## 2018-11-12 DIAGNOSIS — Z789 Other specified health status: Secondary | ICD-10-CM | POA: Insufficient documentation

## 2018-11-12 DIAGNOSIS — K4091 Unilateral inguinal hernia, without obstruction or gangrene, recurrent: Secondary | ICD-10-CM

## 2018-11-12 DIAGNOSIS — E782 Mixed hyperlipidemia: Secondary | ICD-10-CM | POA: Insufficient documentation

## 2018-11-12 DIAGNOSIS — I2510 Atherosclerotic heart disease of native coronary artery without angina pectoris: Secondary | ICD-10-CM

## 2018-11-12 DIAGNOSIS — Z87891 Personal history of nicotine dependence: Secondary | ICD-10-CM | POA: Insufficient documentation

## 2018-11-12 DIAGNOSIS — F32A Depression, unspecified: Secondary | ICD-10-CM

## 2018-11-12 DIAGNOSIS — N529 Male erectile dysfunction, unspecified: Secondary | ICD-10-CM

## 2018-11-12 DIAGNOSIS — I10 Essential (primary) hypertension: Secondary | ICD-10-CM | POA: Diagnosis not present

## 2018-11-12 HISTORY — DX: Encounter for preprocedural cardiovascular examination: Z01.810

## 2018-11-12 HISTORY — DX: Personal history of colonic polyps: Z86.010

## 2018-11-12 HISTORY — DX: Old myocardial infarction: I25.2

## 2018-11-12 HISTORY — DX: Mixed hyperlipidemia: E78.2

## 2018-11-12 HISTORY — DX: Unilateral inguinal hernia, without obstruction or gangrene, recurrent: K40.91

## 2018-11-12 HISTORY — DX: Atherosclerotic heart disease of native coronary artery without angina pectoris: I25.10

## 2018-11-12 HISTORY — DX: Major depressive disorder, single episode, unspecified: F32.9

## 2018-11-12 HISTORY — DX: Personal history of urinary calculi: Z87.442

## 2018-11-12 HISTORY — DX: Male erectile dysfunction, unspecified: N52.9

## 2018-11-12 HISTORY — DX: Other specified health status: Z78.9

## 2018-11-12 HISTORY — DX: Personal history of nicotine dependence: Z87.891

## 2018-11-12 HISTORY — DX: Personal history of colon polyps, unspecified: Z86.0100

## 2018-11-12 HISTORY — DX: Depression, unspecified: F32.A

## 2018-11-12 NOTE — Addendum Note (Signed)
Addended by: Austin Miles on: 11/12/2018 11:05 AM   Modules accepted: Orders

## 2018-11-12 NOTE — Patient Instructions (Signed)
Medication Instructions:  Your physician recommends that you continue on your current medications as directed. Please refer to the Current Medication list given to you today.  If you need a refill on your cardiac medications before your next appointment, please call your pharmacy.   Lab work: None  If you have labs (blood work) drawn today and your tests are completely normal, you will receive your results only by: Marland Kitchen MyChart Message (if you have MyChart) OR . A paper copy in the mail If you have any lab test that is abnormal or we need to change your treatment, we will call you to review the results.  Testing/Procedures: Your physician has requested that you have a lexiscan myoview. For further information please visit [email or web address]. Please follow instruction sheet, as given.  Follow-Up: At [facility], you and your health needs are our priority.  As part of our continuing mission to provide you with exceptional heart care, we have created designated Provider Care Teams.  These Care Teams include your primary Cardiologist (physician) and Advanced Practice Providers (APPs -  Physician Assistants and Nurse Practitioners) who all work together to provide you with the care you need, when you need it. You will need a follow up appointment in 6 months.  Please call our office 2 months in advance to schedule this appointment.      Regadenoson injection What is this medicine? REGADENOSON is used to test the heart for coronary artery disease. It is used in patients who can not exercise for their stress test. This medicine may be used for other purposes; ask your health care provider or pharmacist if you have questions. COMMON BRAND NAME(S): Lexiscan What should I tell my health care provider before I take this medicine? They need to know if you have any of these conditions: -heart problems -lung or breathing disease, like asthma or COPD -an unusual or allergic reaction to regadenoson,  other medicines, foods, dyes, or preservatives -pregnant or trying to get pregnant -breast-feeding How should I use this medicine? This medicine is for injection into a vein. It is given by a health care professional in a hospital or clinic setting. Talk to your pediatrician regarding the use of this medicine in children. Special care may be needed. Overdosage: If you think you have taken too much of this medicine contact a poison control center or emergency room at once. NOTE: This medicine is only for you. Do not share this medicine with others. What if I miss a dose? This does not apply. What may interact with this medicine? -caffeine -dipyridamole -guarana -theophylline This list may not describe all possible interactions. Give your health care provider a list of all the medicines, herbs, non-prescription drugs, or dietary supplements you use. Also tell them if you smoke, drink alcohol, or use illegal drugs. Some items may interact with your medicine. What should I watch for while using this medicine? Your condition will be monitored carefully while you are receiving this medicine. Do not take medicines, foods, or drinks with caffeine (like coffee, tea, or colas) for at least 12 hours before your test. If you do not know if something contains caffeine, ask your health care professional. What side effects may I notice from receiving this medicine? Side effects that you should report to your doctor or health care professional as soon as possible: -allergic reactions like skin rash, itching or hives, swelling of the face, lips, or tongue -breathing problems -chest pain, tightness or palpitations -severe headache Side effects that usually  do not require medical attention (report to your doctor or health care professional if they continue or are bothersome): -flushing -headache -irritation or pain at site where injected -nausea, vomiting This list may not describe all possible side  effects. Call your doctor for medical advice about side effects. You may report side effects to FDA at 1-800-FDA-1088. Where should I keep my medicine? This drug is given in a hospital or clinic and will not be stored at home. NOTE: This sheet is a summary. It may not cover all possible information. If you have questions about this medicine, talk to your doctor, pharmacist, or health care provider.  2019 Elsevier/Gold Standard (2008-07-06 15:08:13)     Cardiac Nuclear Scan A cardiac nuclear scan is a test that is done to check the flow of blood to your heart. It is done when you are resting and when you are exercising. The test looks for problems such as:  Not enough blood reaching a portion of the heart.  The heart muscle not working as it should. You may need this test if:  You have heart disease.  You have had lab results that are not normal.  You have had heart surgery or a balloon procedure to open up blocked arteries (angioplasty).  You have chest pain.  You have shortness of breath. In this test, a special dye (tracer) is put into your bloodstream. The tracer will travel to your heart. A camera will then take pictures of your heart to see how the tracer moves through your heart. This test is usually done at a hospital and takes 2-4 hours. Tell a doctor about:  Any allergies you have.  All medicines you are taking, including vitamins, herbs, eye drops, creams, and over-the-counter medicines.  Any problems you or family members have had with anesthetic medicines.  Any blood disorders you have.  Any surgeries you have had.  Any medical conditions you have.  Whether you are pregnant or may be pregnant. What are the risks? Generally, this is a safe test. However, problems may occur, such as:  Serious chest pain and heart attack. This is only a risk if the stress portion of the test is done.  Rapid heartbeat.  A feeling of warmth in your chest. This feeling usually  does not last long.  Allergic reaction to the tracer. What happens before the test?  Ask your doctor about changing or stopping your normal medicines. This is important.  Follow instructions from your doctor about what you cannot eat or drink.  Remove your jewelry on the day of the test. What happens during the test?  An IV tube will be inserted into one of your veins.  Your doctor will give you a small amount of tracer through the IV tube.  You will wait for 20-40 minutes while the tracer moves through your bloodstream.  Your heart will be monitored with an electrocardiogram (ECG).  You will lie down on an exam table.  Pictures of your heart will be taken for about 15-20 minutes.  You may also have a stress test. For this test, one of these things may be done: ? You will be asked to exercise on a treadmill or a stationary bike. ? You will be given medicines that will make your heart work harder. This is done if you are unable to exercise.  When blood flow to your heart has peaked, a tracer will again be given through the IV tube.  After 20-40 minutes, you will get  back on the exam table. More pictures will be taken of your heart.  Depending on the tracer that is used, more pictures may need to be taken 3-4 hours later.  Your IV tube will be removed when the test is over. The test may vary among doctors and hospitals. What happens after the test?  Ask your doctor: ? Whether you can return to your normal schedule, including diet, activities, and medicines. ? Whether you should drink more fluids. This will help to remove the tracer from your body. Drink enough fluid to keep your pee (urine) pale yellow.  Ask your doctor, or the department that is doing the test: ? When will my results be ready? ? How will I get my results? Summary  A cardiac nuclear scan is a test that is done to check the flow of blood to your heart.  Tell your doctor whether you are pregnant or may be  pregnant.  Before the test, ask your doctor about changing or stopping your normal medicines. This is important.  Ask your doctor whether you can return to your normal activities. You may be asked to drink more fluids. This information is not intended to replace advice given to you by your health care provider. Make sure you discuss any questions you have with your health care provider. Document Released: 04/22/2018 Document Revised: 04/22/2018 Document Reviewed: 04/22/2018 Elsevier Interactive Patient Education  2019 [employer].

## 2018-11-12 NOTE — Progress Notes (Signed)
Cardiology Office Note:    Date:  11/12/2018   ID:  [name] [name], DOB 05-12-1951, MRN [id number]  PCP:  Raeanne Gathers, MD  Cardiologist:  Jenean Lindau, MD   Referring MD: Raeanne Gathers, MD    ASSESSMENT:    1. Pre-operative cardiovascular examination   2. Essential hypertension   3. Mixed hyperlipidemia   4. Coronary artery disease involving native coronary artery of native heart without angina pectoris    PLAN:    In order of problems listed above:  1. Secondary prevention stressed with the patient.  Importance of compliance with diet and medication stressed and he vocalized understanding.  His blood pressure is stable.  Diet was discussed for dyslipidemia and diabetes mellitus. 2. In view of his coronary artery disease status and multiple risk factors for coronary artery disease including diabetes mellitus and sedentary lifestyle I recommended Lexiscan sestamibi.  And he is agreeable.  If this test is negative then he is not at high risk for coronary events during the aforementioned surgery.  Meticulous hemodynamic monitoring will further reduce the risk of coronary events. 3. Patient will be seen in follow-up appointment in 6 months or earlier if the patient has any concerns    Medication Adjustments/Labs and Tests Ordered: Current medicines are reviewed at length with the patient today.  Concerns regarding medicines are outlined above.  No orders of the defined types were placed in this encounter.  No orders of the defined types were placed in this encounter.    History of Present Illness:    [name] [name] is a 67 y.o. male who is being seen today for the evaluation of preop cardiovascular risk assessment at the request of Raeanne Gathers, MD.  Patient is a pleasant 67 year old male.  He has past medical history of coronary artery disease post coronary stenting approximately a decade ago.,  Essential hypertension, diabetes mellitus.  He recently lost his  wife.  He essentially is here in a wheelchair because of disabling knee pain and is planning knee replacement surgery and his supportive son is with him.  He denies any chest pain orthopnea or PND.  He leads a very sedentary lifestyle.  He mentions to me that it is hard for him even to get to his bathroom because of his knee pain.  At the time of my evaluation, the patient is alert awake oriented and in no distress.  Past Medical History:  Diagnosis Date  . 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 DM (diabetes mellitus) ([place])   . HTN (hypertension)   . Hyperlipidemia     Past Surgical History:  Procedure Laterality Date  . CORONARY ANGIOPLASTY WITH STENT PLACEMENT    . LAPAROSCOPIC INGUINAL HERNIA REPAIR      Current Medications: Current Meds  Medication Sig  . ALPRAZolam (XANAX) 0.25 MG tablet Take 0.25 mg by mouth at bedtime as needed for anxiety.   Marland Kitchen CIALIS 20 MG tablet Take 20 mg by mouth daily as needed for erectile dysfunction.   Marland Kitchen COLCRYS 0.6 MG tablet Take 1 tablet (0.6 mg total) by mouth 2 (two) times daily.  . cyanocobalamin (,VITAMIN B-12,) 1000 MCG/ML injection Inject 1,000 mcg into the muscle every 14 (fourteen) days.  Marland Kitchen FREESTYLE INSULINX TEST test strip   . glipiZIDE (GLUCOTROL XL) 10 MG 24 hr tablet Take 10 mg by mouth at bedtime.  Marland Kitchen  HYDROcodone-acetaminophen (NORCO/VICODIN) 5-325 MG tablet Take 1 tablet by mouth every 6 (six) hours as needed.  . Lancets (FREESTYLE) lancets   . levalbuterol (XOPENEX HFA) 45 MCG/ACT inhaler Inhale 1 puff into the lungs every 4 (four) hours as needed for wheezing.   . metoprolol succinate (TOPROL-XL) 25 MG 24 hr tablet Take 12.5 mg by mouth daily.  Marland Kitchen NOVOFINE 30G X 8 MM MISC   . rosuvastatin (CRESTOR) 20 MG tablet Take 20 mg by mouth at bedtime.   . sertraline (ZOLOFT) 25 MG tablet Take 12.5 mg by mouth at bedtime.  Marland Kitchen telmisartan (MICARDIS) 80 MG  tablet Take 80 mg by mouth daily.   . [DISCONTINUED] methocarbamol (ROBAXIN) 500 MG tablet Take 1 tablet (500 mg total) by mouth 2 (two) times daily.     Allergies:   Allopurinol; Aripiprazole; Bupropion; Buspirone; Desvenlafaxine; Duloxetine; Escitalopram; Fish oil; Fluticasone furoate; Hydroxyzine hcl; Nortriptyline; Paroxetine hcl; Ramipril; Sertraline; Venlafaxine; and Vilazodone   Social History   Socioeconomic History  . Marital status: Married    Spouse name: Not on file  . Number of children: Not on file  . Years of education: Not on file  . Highest education level: Not on file  Occupational History  . Not on file  Social Needs  . Financial resource strain: Not on file  . Food insecurity:    Worry: Not on file    Inability: Not on file  . Transportation needs:    Medical: Not on file    Non-medical: Not on file  Tobacco Use  . Smoking status: Former Smoker    Packs/day: 0.50    Years: 10.00    Pack years: 5.00  . Smokeless tobacco: Former Systems developer    Types: Snuff, Sarina Ser    Quit date: 11/20/1968  Substance and Sexual Activity  . Alcohol use: No  . Drug use: No  . Sexual activity: Yes  Lifestyle  . Physical activity:    Days per week: Not on file    Minutes per session: Not on file  . Stress: Not on file  Relationships  . Social connections:    Talks on phone: Not on file    Gets together: Not on file    Attends religious service: Not on file    Active member of club or organization: Not on file    Attends meetings of clubs or organizations: Not on file    Relationship status: Not on file  Other Topics Concern  . Not on file  Social History Narrative  . Not on file     Family History: The patient's family history includes Cancer in his mother; Diabetes in his father; Heart attack in his father; Hypertension in his brother, father, and mother; Irregular heart beat in his brother.  ROS:   Please see the history of present illness.    All other systems reviewed  and are negative.  EKGs/Labs/Other Studies Reviewed:    The following studies were reviewed today: I discussed my findings with the patient at extensive length and EKG reveals sinus rhythm and nonspecific ST-T changes.   Recent Labs: 11/08/2018: BUN 20; Creatinine, Ser 1.27; Hemoglobin 14.6; Platelets 169; Potassium 3.9; Sodium 143  Recent Lipid Panel    Component Value Date/Time   CHOL  02/10/2011 0700    93        ATP III CLASSIFICATION:  <200     mg/dL   Desirable  200-239  mg/dL   Borderline High  >=240    mg/dL  High          TRIG 149 02/10/2011 0700   HDL 30 (L) 02/10/2011 0700   CHOLHDL 3.1 02/10/2011 0700   VLDL 30 02/10/2011 0700   LDLCALC  02/10/2011 0700    33        Total Cholesterol/HDL:CHD Risk Coronary Heart Disease Risk Table                     Men   Women  1/2 Average Risk   3.4   3.3  Average Risk       5.0   4.4  2 X Average Risk   9.6   7.1  3 X Average Risk  23.4   11.0        Use the calculated Patient Ratio above and the CHD Risk Table to determine the patient's CHD Risk.        ATP III CLASSIFICATION (LDL):  <100     mg/dL   Optimal  100-129  mg/dL   Near or Above                    Optimal  130-159  mg/dL   Borderline  160-189  mg/dL   High  >190     mg/dL   Very High    Physical Exam:    VS:  BP 120/62 (BP Location: Right Arm, Patient Position: Sitting, Cuff Size: Normal)   Pulse 64   Ht 6\' 1"  (1.854 m)   Wt 217 lb (98.4 kg)   SpO2 98%   BMI 28.63 kg/m     Wt Readings from Last 3 Encounters:  11/12/18 217 lb (98.4 kg)  11/08/18 214 lb 8 oz (97.3 kg)  08/22/18 215 lb 12.8 oz (97.9 kg)     GEN: Patient is in no acute distress HEENT: Normal NECK: No JVD; No carotid bruits LYMPHATICS: No lymphadenopathy CARDIAC: S1 S2 regular, 2/6 systolic murmur at the apex. RESPIRATORY:  Clear to auscultation without rales, wheezing or rhonchi  ABDOMEN: Soft, non-tender, non-distended MUSCULOSKELETAL:  No edema; No deformity  SKIN: Warm  and dry NEUROLOGIC:  Alert and oriented x 3 PSYCHIATRIC:  Normal affect    Signed, Jenean Lindau, MD  11/12/2018 10:27 AM    [place]

## 2018-11-14 ENCOUNTER — Encounter (HOSPITAL_COMMUNITY): Payer: Medicare Other

## 2018-11-14 ENCOUNTER — Ambulatory Visit (HOSPITAL_BASED_OUTPATIENT_CLINIC_OR_DEPARTMENT_OTHER)
Admission: RE | Admit: 2018-11-14 | Discharge: 2018-11-14 | Disposition: A | Discharge status: Home / Self Care | Payer: Medicare Other | Source: Ambulatory Visit | Attending: Cardiology | Admitting: Cardiology

## 2018-11-14 DIAGNOSIS — I252 Old myocardial infarction: Secondary | ICD-10-CM | POA: Diagnosis not present

## 2018-11-14 DIAGNOSIS — Z833 Family history of diabetes mellitus: Secondary | ICD-10-CM | POA: Diagnosis not present

## 2018-11-14 DIAGNOSIS — Z87891 Personal history of nicotine dependence: Secondary | ICD-10-CM | POA: Diagnosis not present

## 2018-11-14 DIAGNOSIS — I2510 Atherosclerotic heart disease of native coronary artery without angina pectoris: Secondary | ICD-10-CM | POA: Diagnosis not present

## 2018-11-14 DIAGNOSIS — Z87442 Personal history of urinary calculi: Secondary | ICD-10-CM | POA: Diagnosis not present

## 2018-11-14 DIAGNOSIS — Z955 Presence of coronary angioplasty implant and graft: Secondary | ICD-10-CM | POA: Diagnosis not present

## 2018-11-14 DIAGNOSIS — I10 Essential (primary) hypertension: Secondary | ICD-10-CM | POA: Diagnosis not present

## 2018-11-14 DIAGNOSIS — Z8249 Family history of ischemic heart disease and other diseases of the circulatory system: Secondary | ICD-10-CM | POA: Diagnosis not present

## 2018-11-14 DIAGNOSIS — Z01810 Encounter for preprocedural cardiovascular examination: Secondary | ICD-10-CM

## 2018-11-14 DIAGNOSIS — Z888 Allergy status to other drugs, medicaments and biological substances status: Secondary | ICD-10-CM | POA: Diagnosis not present

## 2018-11-14 DIAGNOSIS — I110 Hypertensive heart disease with heart failure: Secondary | ICD-10-CM | POA: Diagnosis not present

## 2018-11-14 DIAGNOSIS — I509 Heart failure, unspecified: Secondary | ICD-10-CM | POA: Diagnosis not present

## 2018-11-14 DIAGNOSIS — Z8719 Personal history of other diseases of the digestive system: Secondary | ICD-10-CM | POA: Diagnosis not present

## 2018-11-14 DIAGNOSIS — Z809 Family history of malignant neoplasm, unspecified: Secondary | ICD-10-CM | POA: Diagnosis not present

## 2018-11-14 DIAGNOSIS — E119 Type 2 diabetes mellitus without complications: Secondary | ICD-10-CM | POA: Diagnosis not present

## 2018-11-14 DIAGNOSIS — Z91013 Allergy to seafood: Secondary | ICD-10-CM | POA: Diagnosis not present

## 2018-11-14 DIAGNOSIS — L723 Sebaceous cyst: Secondary | ICD-10-CM | POA: Diagnosis not present

## 2018-11-14 DIAGNOSIS — L731 Pseudofolliculitis barbae: Secondary | ICD-10-CM | POA: Diagnosis not present

## 2018-11-14 DIAGNOSIS — E782 Mixed hyperlipidemia: Secondary | ICD-10-CM | POA: Diagnosis not present

## 2018-11-14 DIAGNOSIS — M1711 Unilateral primary osteoarthritis, right knee: Secondary | ICD-10-CM | POA: Diagnosis not present

## 2018-11-14 LAB — MYOCARDIAL PERFUSION IMAGING
CHL CUP RESTING HR STRESS: 69 {beats}/min
LV dias vol: 217 mL (ref 62–150)
LV sys vol: 147 mL
Peak HR: 83 {beats}/min
SDS: 1
SRS: 6
SSS: 7
TID: 1.37

## 2018-11-14 MED ORDER — TECHNETIUM TC 99M TETROFOSMIN IV KIT
10.1000 | PACK | Freq: Once | INTRAVENOUS | Status: AC | PRN
  Administered 2018-11-14: 10.1 via INTRAVENOUS
  Filled 2018-11-14: qty 11

## 2018-11-14 MED ORDER — TECHNETIUM TC 99M TETROFOSMIN IV KIT
31.0000 | PACK | Freq: Once | INTRAVENOUS | Status: AC | PRN
  Administered 2018-11-14: 31 via INTRAVENOUS
  Filled 2018-11-14: qty 31

## 2018-11-14 MED ORDER — REGADENOSON 0.4 MG/5ML IV SOLN
0.4000 mg | Freq: Once | INTRAVENOUS | Status: AC
  Administered 2018-11-14: 0.4 mg via INTRAVENOUS

## 2018-11-14 NOTE — Progress Notes (Signed)
This RN f/u on chart left by colleague Dolores Lory RN. Patient underwent stress test 11-14-18 . Results in epic indicate high risk. Patient chart given to APPs Janett Billow and Ebony Hail to f/u. APPs made aware patient surgery is tomorrow .

## 2018-11-14 NOTE — Progress Notes (Signed)
Anesthesia Chart Review   Case:  [id number] Date/Time:  11/15/18 1045   Procedure:  RIGHT TOTAL KNEE ARTHROPLASTY (Right )   Anesthesia type:  Choice   Pre-op diagnosis:  right knee osteoarthritis   Location:  [facility] ROOM 10 / [facility]   Surgeon:  Mcarthur Rossetti, MD      DISCUSSION:67 yo male former smoker with h/o CAD (nonST elevated MI in Oct 2008 treated w/2 drug -eluting stents to the RCA and  mid LAD), Hyperlipidemia, DM II, HTN who is scheduled for above surgery on 11/15/18 with Dr. Ninfa Linden.   Cardiac clearance requested by PCP, Raeanne Gathers, MD.  He was seen by cardiologist, Jyl Heinz, MD on 11/12/18.  At this visit reported he is no longer taking Plavix. Pt denies chest pain orthopnea or PND.  Per Dr. Julien Nordmann note, "In view of his coronary artery disease status and multiple risk factors for coronary artery disease including diabetes mellitus and sedentary lifestyle I recommended Lexiscan sestamibi.  And he is agreeable.  If this test is negative then he is not at high risk for coronary events during the aforementioned surgery.  Meticulous hemodynamic monitoring will further reduce the risk of coronary events."  Stress test done on 11/14/18, EF 32%, no ischemia noted.  Considered high risk study due to EF.    Spoke with Dr. Geraldo Pitter today regarding results of stress test.  He states that patient is cleared for surgery tomorrow, at moderate risk, no ischemia noted on stress test, no need for echo at this time.  Pt can proceed with planned procedure barring acute status change.  VS: BP 122/78   Pulse 70   Temp 36.9 C (Oral)   Resp 18   Ht 6\' 1"  (1.854 m)   Wt 97.3 kg   SpO2 99%   BMI 28.30 kg/m   PROVIDERS: Raeanne Gathers, MD is PCP  Jyl Heinz, MD is Cardiologist  LABS: Labs reviewed: Acceptable for surgery. (all labs ordered are listed, but only abnormal results are displayed)  Labs Reviewed  SURGICAL PCR SCREEN - Abnormal; Notable for the following  components:      Result Value   Staphylococcus aureus POSITIVE (*)    All other components within normal limits  HEMOGLOBIN A1C - Abnormal; Notable for the following components:   Hgb A1c MFr Bld 7.2 (*)    All other components within normal limits  BASIC METABOLIC PANEL - Abnormal; Notable for the following components:   Glucose, Bld 120 (*)    Creatinine, Ser 1.27 (*)    GFR calc non Af Amer 59 (*)    All other components within normal limits  GLUCOSE, CAPILLARY - Abnormal; Notable for the following components:   Glucose-Capillary 150 (*)    All other components within normal limits  CBC     IMAGES:   EKG:  11/08/18 Rate 67bpm Normal sinus rhythm Nonspecific T wave abnormality Prolonged QT Abnormal ECG  CV:  Stress test 11/14/18   Nuclear stress EF: 32%.  The left ventricular ejection fraction is moderately decreased (30-44%).  There was no ST segment deviation noted during stress.  Findings consistent with prior myocardial infarction.  This is a high risk study.   Small inferior basal wall infarct no ischemia Diffuse hypokinesis EF estimated at 32% suggest correlation With MRI or TTE High risk due to low EF   Past Medical History:  Diagnosis Date  . CAD (coronary artery disease)    nonST elevated MI in Oct 2008 treated  w/2 drug -eluting stents to the RCA and  mid LAD, EF 55%  . DM (diabetes mellitus) ([place])   . HTN (hypertension)   . Hyperlipidemia     Past Surgical History:  Procedure Laterality Date  . CORONARY ANGIOPLASTY WITH STENT PLACEMENT    . LAPAROSCOPIC INGUINAL HERNIA REPAIR      MEDICATIONS: . ALPRAZolam (XANAX) 0.25 MG tablet  . aspirin 81 MG tablet  . CIALIS 20 MG tablet  . clopidogrel (PLAVIX) 75 MG tablet  . COLCRYS 0.6 MG tablet  . cyanocobalamin (,VITAMIN B-12,) 1000 MCG/ML injection  . FREESTYLE INSULINX TEST test strip  . glipiZIDE (GLUCOTROL XL) 10 MG 24 hr tablet  . HYDROcodone-acetaminophen (NORCO/VICODIN) 5-325 MG  tablet  . insulin glargine (LANTUS) 100 UNIT/ML injection  . Lancets (FREESTYLE) lancets  . levalbuterol (XOPENEX HFA) 45 MCG/ACT inhaler  . metoprolol succinate (TOPROL-XL) 25 MG 24 hr tablet  . NOVOFINE 30G X 8 MM MISC  . rosuvastatin (CRESTOR) 20 MG tablet  . sertraline (ZOLOFT) 25 MG tablet  . telmisartan (MICARDIS) 80 MG tablet   No current facility-administered medications for this encounter.      [facility] Pre-Surgical Testing [phone] 11/14/18 3:23 PM

## 2018-11-14 NOTE — Anesthesia Preprocedure Evaluation (Addendum)
Anesthesia Evaluation  Patient identified by MRN, date of birth, ID band Patient awake    Reviewed: Allergy & Precautions, NPO status , Patient's Chart, lab work & pertinent test results, reviewed documented beta blocker date and time   Airway Mallampati: II  TM Distance: >3 FB     Dental   Pulmonary former smoker,    breath sounds clear to auscultation       Cardiovascular hypertension, Pt. on home beta blockers and Pt. on medications + CAD, + Past MI, + Cardiac Stents and +CHF   Rhythm:Regular Rate:Normal  Stress test done on 11/14/18, EF 32%, no ischemia noted.  Considered high risk study due to EF.    Discussion with PA and Dr. Geraldo Pitter regarding results of stress test.  He states that patient is cleared for surgery tomorrow, at moderate risk, no ischemia noted on stress test, no need for echo at this time.   Neuro/Psych negative neurological ROS     GI/Hepatic negative GI ROS, Neg liver ROS,   Endo/Other  diabetes, Type 2, Insulin Dependent  Renal/GU negative Renal ROS     Musculoskeletal  (+) Arthritis ,   Abdominal   Peds  Hematology  (+) Blood dyscrasia (on plavix. Last dose 12/22), ,   Anesthesia Other Findings   Reproductive/Obstetrics                            Lab Results  Component Value Date   WBC 7.1 11/08/2018   HGB 14.6 11/08/2018   HCT 47.1 11/08/2018   MCV 91.8 11/08/2018   PLT 169 11/08/2018   Lab Results  Component Value Date   CREATININE 1.27 (H) 11/08/2018   BUN 20 11/08/2018   NA 143 11/08/2018   K 3.9 11/08/2018   CL 103 11/08/2018   CO2 30 11/08/2018    Anesthesia Physical Anesthesia Plan  ASA: III  Anesthesia Plan: General   Post-op Pain Management:  Regional for Post-op pain   Induction: Intravenous  PONV Risk Score and Plan: 2 and Ondansetron, Dexamethasone and Treatment may vary due to age or medical condition  Airway Management Planned:  LMA and Oral ETT  Additional Equipment:   Intra-op Plan:   Post-operative Plan: Extubation in OR  Informed Consent: I have reviewed the patients History and Physical, chart, labs and discussed the procedure including the risks, benefits and alternatives for the proposed anesthesia with the patient or authorized representative who has indicated his/her understanding and acceptance.   Dental advisory given  Plan Discussed with:   Anesthesia Plan Comments:        Anesthesia Quick Evaluation

## 2018-11-15 ENCOUNTER — Telehealth: Payer: Self-pay | Admitting: *Deleted

## 2018-11-15 ENCOUNTER — Inpatient Hospital Stay (HOSPITAL_COMMUNITY)
Admission: AD | Admit: 2018-11-15 | Discharge: 2018-11-19 | DRG: 470 | Disposition: A | Discharge status: Skilled Nursing Facility | Payer: Medicare Other | Attending: Orthopaedic Surgery | Admitting: Orthopaedic Surgery

## 2018-11-15 ENCOUNTER — Encounter (HOSPITAL_COMMUNITY)
Admission: AD | Disposition: A | Discharge status: Skilled Nursing Facility | Payer: Self-pay | Source: Home / Self Care | Attending: Orthopaedic Surgery

## 2018-11-15 ENCOUNTER — Ambulatory Visit (HOSPITAL_COMMUNITY): Payer: Medicare Other | Admitting: Anesthesiology

## 2018-11-15 ENCOUNTER — Inpatient Hospital Stay (HOSPITAL_COMMUNITY): Payer: Medicare Other

## 2018-11-15 ENCOUNTER — Other Ambulatory Visit: Payer: Self-pay

## 2018-11-15 ENCOUNTER — Encounter (HOSPITAL_COMMUNITY): Payer: Self-pay | Admitting: *Deleted

## 2018-11-15 ENCOUNTER — Ambulatory Visit (HOSPITAL_COMMUNITY): Payer: Medicare Other | Admitting: Physician Assistant

## 2018-11-15 DIAGNOSIS — L731 Pseudofolliculitis barbae: Secondary | ICD-10-CM | POA: Diagnosis present

## 2018-11-15 DIAGNOSIS — Z91013 Allergy to seafood: Secondary | ICD-10-CM | POA: Diagnosis not present

## 2018-11-15 DIAGNOSIS — Z888 Allergy status to other drugs, medicaments and biological substances status: Secondary | ICD-10-CM

## 2018-11-15 DIAGNOSIS — I110 Hypertensive heart disease with heart failure: Secondary | ICD-10-CM | POA: Diagnosis present

## 2018-11-15 DIAGNOSIS — Z96651 Presence of right artificial knee joint: Secondary | ICD-10-CM | POA: Diagnosis not present

## 2018-11-15 DIAGNOSIS — E119 Type 2 diabetes mellitus without complications: Secondary | ICD-10-CM | POA: Diagnosis present

## 2018-11-15 DIAGNOSIS — Z8719 Personal history of other diseases of the digestive system: Secondary | ICD-10-CM | POA: Diagnosis not present

## 2018-11-15 DIAGNOSIS — M1711 Unilateral primary osteoarthritis, right knee: Secondary | ICD-10-CM | POA: Diagnosis not present

## 2018-11-15 DIAGNOSIS — I509 Heart failure, unspecified: Secondary | ICD-10-CM | POA: Diagnosis present

## 2018-11-15 DIAGNOSIS — Z809 Family history of malignant neoplasm, unspecified: Secondary | ICD-10-CM | POA: Diagnosis not present

## 2018-11-15 DIAGNOSIS — Z833 Family history of diabetes mellitus: Secondary | ICD-10-CM

## 2018-11-15 DIAGNOSIS — R262 Difficulty in walking, not elsewhere classified: Secondary | ICD-10-CM | POA: Diagnosis not present

## 2018-11-15 DIAGNOSIS — I2510 Atherosclerotic heart disease of native coronary artery without angina pectoris: Secondary | ICD-10-CM | POA: Diagnosis not present

## 2018-11-15 DIAGNOSIS — E782 Mixed hyperlipidemia: Secondary | ICD-10-CM | POA: Diagnosis not present

## 2018-11-15 DIAGNOSIS — K4091 Unilateral inguinal hernia, without obstruction or gangrene, recurrent: Secondary | ICD-10-CM | POA: Diagnosis not present

## 2018-11-15 DIAGNOSIS — R278 Other lack of coordination: Secondary | ICD-10-CM | POA: Diagnosis not present

## 2018-11-15 DIAGNOSIS — E1169 Type 2 diabetes mellitus with other specified complication: Secondary | ICD-10-CM | POA: Diagnosis not present

## 2018-11-15 DIAGNOSIS — Z471 Aftercare following joint replacement surgery: Secondary | ICD-10-CM | POA: Diagnosis not present

## 2018-11-15 DIAGNOSIS — Z87442 Personal history of urinary calculi: Secondary | ICD-10-CM

## 2018-11-15 DIAGNOSIS — I252 Old myocardial infarction: Secondary | ICD-10-CM

## 2018-11-15 DIAGNOSIS — F329 Major depressive disorder, single episode, unspecified: Secondary | ICD-10-CM | POA: Diagnosis present

## 2018-11-15 DIAGNOSIS — Z955 Presence of coronary angioplasty implant and graft: Secondary | ICD-10-CM

## 2018-11-15 DIAGNOSIS — Z8249 Family history of ischemic heart disease and other diseases of the circulatory system: Secondary | ICD-10-CM | POA: Diagnosis not present

## 2018-11-15 DIAGNOSIS — L723 Sebaceous cyst: Secondary | ICD-10-CM | POA: Diagnosis not present

## 2018-11-15 DIAGNOSIS — Z87891 Personal history of nicotine dependence: Secondary | ICD-10-CM | POA: Diagnosis not present

## 2018-11-15 DIAGNOSIS — G8918 Other acute postprocedural pain: Secondary | ICD-10-CM | POA: Diagnosis not present

## 2018-11-15 DIAGNOSIS — I10 Essential (primary) hypertension: Secondary | ICD-10-CM | POA: Diagnosis not present

## 2018-11-15 DIAGNOSIS — M25561 Pain in right knee: Secondary | ICD-10-CM | POA: Diagnosis present

## 2018-11-15 HISTORY — DX: Unilateral primary osteoarthritis, right knee: M17.11

## 2018-11-15 HISTORY — PX: TOTAL KNEE ARTHROPLASTY: SHX125

## 2018-11-15 HISTORY — DX: Presence of right artificial knee joint: Z96.651

## 2018-11-15 LAB — GLUCOSE, CAPILLARY
GLUCOSE-CAPILLARY: 212 mg/dL — AB (ref 70–99)
Glucose-Capillary: 108 mg/dL — ABNORMAL HIGH (ref 70–99)
Glucose-Capillary: 112 mg/dL — ABNORMAL HIGH (ref 70–99)

## 2018-11-15 SURGERY — ARTHROPLASTY, KNEE, TOTAL
Anesthesia: General | Site: Knee | Laterality: Right

## 2018-11-15 MED ORDER — HYDROMORPHONE HCL 1 MG/ML IJ SOLN
0.2500 mg | INTRAMUSCULAR | Status: DC | PRN
  Administered 2018-11-15: 0.5 mg via INTRAVENOUS

## 2018-11-15 MED ORDER — HYDROMORPHONE HCL 1 MG/ML IJ SOLN
INTRAMUSCULAR | Status: AC
  Administered 2018-11-16: 1 mg via INTRAVENOUS
  Filled 2018-11-15: qty 2

## 2018-11-15 MED ORDER — ONDANSETRON HCL 4 MG/2ML IJ SOLN
INTRAMUSCULAR | Status: DC | PRN
  Administered 2018-11-15: 4 mg via INTRAVENOUS

## 2018-11-15 MED ORDER — FENTANYL CITRATE (PF) 250 MCG/5ML IJ SOLN
INTRAMUSCULAR | Status: DC | PRN
  Administered 2018-11-15 (×6): 50 ug via INTRAVENOUS

## 2018-11-15 MED ORDER — HYDROMORPHONE HCL 1 MG/ML IJ SOLN
INTRAMUSCULAR | Status: AC
  Filled 2018-11-15: qty 2

## 2018-11-15 MED ORDER — SODIUM CHLORIDE 0.9 % IV SOLN
INTRAVENOUS | Status: DC | PRN
  Administered 2018-11-15: 30 ug/min via INTRAVENOUS

## 2018-11-15 MED ORDER — PROMETHAZINE HCL 25 MG/ML IJ SOLN: 6.2500 mg | INTRAMUSCULAR | Status: DC | PRN

## 2018-11-15 MED ORDER — LIDOCAINE 2% (20 MG/ML) 5 ML SYRINGE
INTRAMUSCULAR | Status: AC
  Filled 2018-11-15: qty 5

## 2018-11-15 MED ORDER — ASPIRIN EC 81 MG PO TBEC
81.0000 mg | DELAYED_RELEASE_TABLET | Freq: Every day | ORAL | Status: DC
  Administered 2018-11-15 – 2018-11-18 (×4): 81 mg via ORAL
  Filled 2018-11-15 (×4): qty 1

## 2018-11-15 MED ORDER — SUGAMMADEX SODIUM 200 MG/2ML IV SOLN
INTRAVENOUS | Status: DC | PRN
  Administered 2018-11-15: 200 mg via INTRAVENOUS

## 2018-11-15 MED ORDER — FENTANYL CITRATE (PF) 100 MCG/2ML IJ SOLN
50.0000 ug | INTRAMUSCULAR | Status: DC
  Administered 2018-11-15: 50 ug via INTRAVENOUS

## 2018-11-15 MED ORDER — HYDROMORPHONE HCL 2 MG/ML IJ SOLN
INTRAMUSCULAR | Status: AC
  Filled 2018-11-15: qty 1

## 2018-11-15 MED ORDER — SUGAMMADEX SODIUM 200 MG/2ML IV SOLN
INTRAVENOUS | Status: AC
  Filled 2018-11-15: qty 2

## 2018-11-15 MED ORDER — PANTOPRAZOLE SODIUM 40 MG PO TBEC
40.0000 mg | DELAYED_RELEASE_TABLET | Freq: Every day | ORAL | Status: DC
  Administered 2018-11-16 – 2018-11-19 (×4): 40 mg via ORAL
  Filled 2018-11-15 (×4): qty 1

## 2018-11-15 MED ORDER — METHOCARBAMOL 500 MG PO TABS
500.0000 mg | ORAL_TABLET | Freq: Four times a day (QID) | ORAL | Status: DC | PRN
  Administered 2018-11-15 – 2018-11-19 (×11): 500 mg via ORAL
  Filled 2018-11-15 (×12): qty 1

## 2018-11-15 MED ORDER — ALUM & MAG HYDROXIDE-SIMETH 200-200-20 MG/5ML PO SUSP: 30.0000 mL | ORAL | Status: DC | PRN

## 2018-11-15 MED ORDER — DEXAMETHASONE SODIUM PHOSPHATE 10 MG/ML IJ SOLN
INTRAMUSCULAR | Status: AC
  Filled 2018-11-15: qty 1

## 2018-11-15 MED ORDER — METHOCARBAMOL 500 MG IVPB - SIMPLE MED
500.0000 mg | Freq: Four times a day (QID) | INTRAVENOUS | Status: DC | PRN
  Administered 2018-11-15: 500 mg via INTRAVENOUS
  Filled 2018-11-15: qty 50

## 2018-11-15 MED ORDER — PROPOFOL 10 MG/ML IV BOLUS
INTRAVENOUS | Status: DC | PRN
  Administered 2018-11-15: 150 mg via INTRAVENOUS

## 2018-11-15 MED ORDER — OXYCODONE HCL 5 MG PO TABS
10.0000 mg | ORAL_TABLET | ORAL | Status: DC | PRN
  Administered 2018-11-16 (×4): 15 mg via ORAL
  Administered 2018-11-16: 10 mg via ORAL
  Administered 2018-11-17 (×2): 15 mg via ORAL
  Administered 2018-11-17: 10 mg via ORAL
  Administered 2018-11-17 (×2): 15 mg via ORAL
  Administered 2018-11-18 (×2): 10 mg via ORAL
  Administered 2018-11-18 (×3): 15 mg via ORAL
  Administered 2018-11-19: 10 mg via ORAL
  Filled 2018-11-15 (×11): qty 3
  Filled 2018-11-15: qty 2
  Filled 2018-11-15: qty 3

## 2018-11-15 MED ORDER — PHENYLEPHRINE HCL 10 MG/ML IJ SOLN
INTRAMUSCULAR | Status: AC
  Filled 2018-11-15: qty 2

## 2018-11-15 MED ORDER — INSULIN ASPART 100 UNIT/ML ~~LOC~~ SOLN
0.0000 [IU] | Freq: Every day | SUBCUTANEOUS | Status: DC
  Administered 2018-11-15: 2 [IU] via SUBCUTANEOUS

## 2018-11-15 MED ORDER — CEFAZOLIN SODIUM-DEXTROSE 2-4 GM/100ML-% IV SOLN
2.0000 g | INTRAVENOUS | Status: AC
  Administered 2018-11-15: 2 g via INTRAVENOUS
  Filled 2018-11-15: qty 100

## 2018-11-15 MED ORDER — INSULIN ASPART 100 UNIT/ML ~~LOC~~ SOLN
0.0000 [IU] | Freq: Three times a day (TID) | SUBCUTANEOUS | Status: DC
  Administered 2018-11-16: 5 [IU] via SUBCUTANEOUS
  Administered 2018-11-16: 3 [IU] via SUBCUTANEOUS
  Administered 2018-11-17: 2 [IU] via SUBCUTANEOUS
  Administered 2018-11-17: 3 [IU] via SUBCUTANEOUS
  Administered 2018-11-17: 2 [IU] via SUBCUTANEOUS
  Administered 2018-11-18 – 2018-11-19 (×3): 3 [IU] via SUBCUTANEOUS

## 2018-11-15 MED ORDER — PHENOL 1.4 % MT LIQD
1.0000 | OROMUCOSAL | Status: DC | PRN
  Filled 2018-11-15: qty 177

## 2018-11-15 MED ORDER — FENTANYL CITRATE (PF) 100 MCG/2ML IJ SOLN
INTRAMUSCULAR | Status: AC
  Filled 2018-11-15: qty 2

## 2018-11-15 MED ORDER — MIDAZOLAM HCL 2 MG/2ML IJ SOLN
INTRAMUSCULAR | Status: AC
  Administered 2018-11-15: 1 mg via INTRAVENOUS
  Filled 2018-11-15: qty 2

## 2018-11-15 MED ORDER — COLCHICINE 0.6 MG PO TABS
0.6000 mg | ORAL_TABLET | Freq: Two times a day (BID) | ORAL | Status: DC
  Administered 2018-11-16 – 2018-11-18 (×3): 0.6 mg via ORAL
  Filled 2018-11-15 (×6): qty 1

## 2018-11-15 MED ORDER — LACTATED RINGERS IV SOLN
INTRAVENOUS | Status: DC
  Administered 2018-11-15 (×2): via INTRAVENOUS

## 2018-11-15 MED ORDER — CLONIDINE HCL (ANALGESIA) 100 MCG/ML EP SOLN
EPIDURAL | Status: DC | PRN
  Administered 2018-11-15: 50 ug

## 2018-11-15 MED ORDER — INSULIN ASPART 100 UNIT/ML ~~LOC~~ SOLN
4.0000 [IU] | Freq: Three times a day (TID) | SUBCUTANEOUS | Status: DC
  Administered 2018-11-16 – 2018-11-19 (×7): 4 [IU] via SUBCUTANEOUS

## 2018-11-15 MED ORDER — ROCURONIUM BROMIDE 10 MG/ML (PF) SYRINGE
PREFILLED_SYRINGE | INTRAVENOUS | Status: DC | PRN
  Administered 2018-11-15: 50 mg via INTRAVENOUS
  Administered 2018-11-15 (×2): 10 mg via INTRAVENOUS

## 2018-11-15 MED ORDER — METOCLOPRAMIDE HCL 5 MG/ML IJ SOLN: 5.0000 mg | Freq: Three times a day (TID) | INTRAMUSCULAR | Status: DC | PRN

## 2018-11-15 MED ORDER — DEXAMETHASONE SODIUM PHOSPHATE 10 MG/ML IJ SOLN
INTRAMUSCULAR | Status: DC | PRN
  Administered 2018-11-15: 8 mg via INTRAVENOUS

## 2018-11-15 MED ORDER — SODIUM CHLORIDE 0.9 % IR SOLN
Status: DC | PRN
  Administered 2018-11-15: 1000 mL

## 2018-11-15 MED ORDER — METOPROLOL SUCCINATE ER 25 MG PO TB24
12.5000 mg | ORAL_TABLET | Freq: Every day | ORAL | Status: DC
  Administered 2018-11-16 – 2018-11-19 (×4): 12.5 mg via ORAL
  Filled 2018-11-15 (×4): qty 1

## 2018-11-15 MED ORDER — EPHEDRINE SULFATE-NACL 50-0.9 MG/10ML-% IV SOSY
PREFILLED_SYRINGE | INTRAVENOUS | Status: DC | PRN
  Administered 2018-11-15 (×2): 5 mg via INTRAVENOUS

## 2018-11-15 MED ORDER — LABETALOL HCL 5 MG/ML IV SOLN
INTRAVENOUS | Status: DC | PRN
  Administered 2018-11-15: 5 mg via INTRAVENOUS

## 2018-11-15 MED ORDER — GLIPIZIDE ER 5 MG PO TB24
10.0000 mg | ORAL_TABLET | Freq: Every day | ORAL | Status: DC
  Administered 2018-11-15 – 2018-11-18 (×4): 10 mg via ORAL
  Filled 2018-11-15 (×4): qty 2

## 2018-11-15 MED ORDER — POLYETHYLENE GLYCOL 3350 17 G PO PACK: 17.0000 g | PACK | Freq: Every day | ORAL | Status: DC | PRN

## 2018-11-15 MED ORDER — ALPRAZOLAM 0.25 MG PO TABS
0.2500 mg | ORAL_TABLET | Freq: Every evening | ORAL | Status: DC | PRN
  Filled 2018-11-15: qty 1

## 2018-11-15 MED ORDER — LABETALOL HCL 5 MG/ML IV SOLN
INTRAVENOUS | Status: AC
  Filled 2018-11-15: qty 4

## 2018-11-15 MED ORDER — METOCLOPRAMIDE HCL 5 MG PO TABS: 5.0000 mg | ORAL_TABLET | Freq: Three times a day (TID) | ORAL | Status: DC | PRN

## 2018-11-15 MED ORDER — ONDANSETRON HCL 4 MG PO TABS: 4.0000 mg | ORAL_TABLET | Freq: Four times a day (QID) | ORAL | Status: DC | PRN

## 2018-11-15 MED ORDER — BUPIVACAINE-EPINEPHRINE (PF) 0.5% -1:200000 IJ SOLN
INTRAMUSCULAR | Status: DC | PRN
  Administered 2018-11-15: 20 mL via PERINEURAL

## 2018-11-15 MED ORDER — ACETAMINOPHEN 325 MG PO TABS
325.0000 mg | ORAL_TABLET | Freq: Four times a day (QID) | ORAL | Status: DC | PRN
  Administered 2018-11-16 – 2018-11-18 (×2): 650 mg via ORAL
  Filled 2018-11-15: qty 2

## 2018-11-15 MED ORDER — PHENYLEPHRINE 40 MCG/ML (10ML) SYRINGE FOR IV PUSH (FOR BLOOD PRESSURE SUPPORT)
PREFILLED_SYRINGE | INTRAVENOUS | Status: AC
  Filled 2018-11-15: qty 10

## 2018-11-15 MED ORDER — SERTRALINE HCL 25 MG PO TABS
12.5000 mg | ORAL_TABLET | Freq: Every day | ORAL | Status: DC
  Administered 2018-11-15 – 2018-11-18 (×4): 12.5 mg via ORAL
  Filled 2018-11-15 (×4): qty 1

## 2018-11-15 MED ORDER — TRANEXAMIC ACID-NACL 1000-0.7 MG/100ML-% IV SOLN
1000.0000 mg | INTRAVENOUS | Status: AC
  Administered 2018-11-15: 1000 mg via INTRAVENOUS
  Filled 2018-11-15: qty 100

## 2018-11-15 MED ORDER — DIPHENHYDRAMINE HCL 12.5 MG/5ML PO ELIX: 12.5000 mg | ORAL_SOLUTION | ORAL | Status: DC | PRN

## 2018-11-15 MED ORDER — LIDOCAINE 2% (20 MG/ML) 5 ML SYRINGE
INTRAMUSCULAR | Status: DC | PRN
  Administered 2018-11-15: 60 mg via INTRAVENOUS

## 2018-11-15 MED ORDER — STERILE WATER FOR IRRIGATION IR SOLN
Status: DC | PRN
  Administered 2018-11-15: 2000 mL

## 2018-11-15 MED ORDER — OXYCODONE HCL 5 MG PO TABS
5.0000 mg | ORAL_TABLET | ORAL | Status: DC | PRN
  Administered 2018-11-15: 5 mg via ORAL
  Administered 2018-11-15 – 2018-11-19 (×3): 10 mg via ORAL
  Filled 2018-11-15 (×4): qty 2
  Filled 2018-11-15: qty 1
  Filled 2018-11-15 (×2): qty 2

## 2018-11-15 MED ORDER — ONDANSETRON HCL 4 MG/2ML IJ SOLN: 4.0000 mg | Freq: Four times a day (QID) | INTRAMUSCULAR | Status: DC | PRN

## 2018-11-15 MED ORDER — 0.9 % SODIUM CHLORIDE (POUR BTL) OPTIME
TOPICAL | Status: DC | PRN
  Administered 2018-11-15: 1000 mL

## 2018-11-15 MED ORDER — HYDROMORPHONE HCL 1 MG/ML IJ SOLN
0.5000 mg | INTRAMUSCULAR | Status: DC | PRN
  Administered 2018-11-16 – 2018-11-18 (×7): 1 mg via INTRAVENOUS
  Filled 2018-11-15 (×7): qty 1

## 2018-11-15 MED ORDER — DOCUSATE SODIUM 100 MG PO CAPS
100.0000 mg | ORAL_CAPSULE | Freq: Two times a day (BID) | ORAL | Status: DC
  Administered 2018-11-15 – 2018-11-19 (×8): 100 mg via ORAL
  Filled 2018-11-15 (×8): qty 1

## 2018-11-15 MED ORDER — IRBESARTAN 150 MG PO TABS
300.0000 mg | ORAL_TABLET | Freq: Every day | ORAL | Status: DC
  Administered 2018-11-16 – 2018-11-19 (×4): 300 mg via ORAL
  Filled 2018-11-15 (×4): qty 2

## 2018-11-15 MED ORDER — PROPOFOL 10 MG/ML IV BOLUS
INTRAVENOUS | Status: AC
  Filled 2018-11-15: qty 80

## 2018-11-15 MED ORDER — CEFAZOLIN SODIUM-DEXTROSE 1-4 GM/50ML-% IV SOLN
1.0000 g | Freq: Four times a day (QID) | INTRAVENOUS | Status: AC
  Administered 2018-11-15 (×2): 1 g via INTRAVENOUS
  Filled 2018-11-15 (×2): qty 50

## 2018-11-15 MED ORDER — ROCURONIUM BROMIDE 10 MG/ML (PF) SYRINGE
PREFILLED_SYRINGE | INTRAVENOUS | Status: AC
  Filled 2018-11-15: qty 10

## 2018-11-15 MED ORDER — FENTANYL CITRATE (PF) 100 MCG/2ML IJ SOLN
INTRAMUSCULAR | Status: AC
  Administered 2018-11-15: 50 ug via INTRAVENOUS
  Filled 2018-11-15: qty 2

## 2018-11-15 MED ORDER — MENTHOL 3 MG MT LOZG: 1.0000 | LOZENGE | OROMUCOSAL | Status: DC | PRN

## 2018-11-15 MED ORDER — CHLORHEXIDINE GLUCONATE 4 % EX LIQD: 60.0000 mL | Freq: Once | CUTANEOUS | Status: DC

## 2018-11-15 MED ORDER — HYDROMORPHONE HCL 1 MG/ML IJ SOLN
0.2500 mg | INTRAMUSCULAR | Status: DC | PRN
  Administered 2018-11-15 (×4): 0.5 mg via INTRAVENOUS

## 2018-11-15 MED ORDER — SODIUM CHLORIDE 0.9 % IV SOLN
INTRAVENOUS | Status: DC
  Administered 2018-11-15 (×2): via INTRAVENOUS

## 2018-11-15 MED ORDER — ROSUVASTATIN CALCIUM 20 MG PO TABS
20.0000 mg | ORAL_TABLET | Freq: Every day | ORAL | Status: DC
  Administered 2018-11-15 – 2018-11-18 (×4): 20 mg via ORAL
  Filled 2018-11-15 (×4): qty 1

## 2018-11-15 MED ORDER — METHOCARBAMOL 500 MG IVPB - SIMPLE MED
INTRAVENOUS | Status: AC
  Filled 2018-11-15: qty 50

## 2018-11-15 MED ORDER — MIDAZOLAM HCL 2 MG/2ML IJ SOLN
1.0000 mg | INTRAMUSCULAR | Status: DC
  Administered 2018-11-15: 1 mg via INTRAVENOUS

## 2018-11-15 MED ORDER — ONDANSETRON HCL 4 MG/2ML IJ SOLN
INTRAMUSCULAR | Status: AC
  Filled 2018-11-15: qty 2

## 2018-11-15 MED ORDER — EPHEDRINE 5 MG/ML INJ
INTRAVENOUS | Status: AC
  Filled 2018-11-15: qty 10

## 2018-11-15 MED ORDER — CLOPIDOGREL BISULFATE 75 MG PO TABS
75.0000 mg | ORAL_TABLET | Freq: Every day | ORAL | Status: DC
  Administered 2018-11-16 – 2018-11-19 (×4): 75 mg via ORAL
  Filled 2018-11-15 (×4): qty 1

## 2018-11-15 MED ORDER — PHENYLEPHRINE 40 MCG/ML (10ML) SYRINGE FOR IV PUSH (FOR BLOOD PRESSURE SUPPORT)
PREFILLED_SYRINGE | INTRAVENOUS | Status: DC | PRN
  Administered 2018-11-15: 120 ug via INTRAVENOUS

## 2018-11-15 MED ORDER — HYDROMORPHONE HCL 1 MG/ML IJ SOLN
INTRAMUSCULAR | Status: DC | PRN
  Administered 2018-11-15 (×4): 0.5 mg via INTRAVENOUS

## 2018-11-15 SURGICAL SUPPLY — 57 items
BAG ZIPLOCK 12X15 (MISCELLANEOUS) IMPLANT
BANDAGE ACE 6X5 VEL STRL LF (GAUZE/BANDAGES/DRESSINGS) ×2 IMPLANT
BANDAGE ELASTIC 4 VELCRO ST LF (GAUZE/BANDAGES/DRESSINGS) ×2 IMPLANT
BANDAGE ELASTIC 6 VELCRO ST LF (GAUZE/BANDAGES/DRESSINGS) ×2 IMPLANT
BASEPLATE TIBIAL UNV SZ5 (Plate) ×2 IMPLANT
BEARIN INSERT TIBIAL TRIA #5 (Orthopedic Implant) ×2 IMPLANT
BEARING INSERT TIBIAL TRIA #5 (Orthopedic Implant) ×1 IMPLANT
BENZOIN TINCTURE PRP APPL 2/3 (GAUZE/BANDAGES/DRESSINGS) IMPLANT
BLADE SAG 18X100X1.27 (BLADE) ×2 IMPLANT
BLADE SURG SZ10 CARB STEEL (BLADE) ×4 IMPLANT
BOWL SMART MIX CTS (DISPOSABLE) ×2 IMPLANT
CEMENT BONE SIMPLEX SPEEDSET (Cement) ×4 IMPLANT
COVER SURGICAL LIGHT HANDLE (MISCELLANEOUS) ×2 IMPLANT
COVER WAND RF STERILE (DRAPES) IMPLANT
CUFF TOURN SGL QUICK 34 (TOURNIQUET CUFF) ×1
CUFF TRNQT CYL 34X4X40X1 (TOURNIQUET CUFF) ×1 IMPLANT
DECANTER SPIKE VIAL GLASS SM (MISCELLANEOUS) IMPLANT
DRAPE U-SHAPE 47X51 STRL (DRAPES) ×2 IMPLANT
DRSG PAD ABDOMINAL 8X10 ST (GAUZE/BANDAGES/DRESSINGS) ×2 IMPLANT
DURAPREP 26ML APPLICATOR (WOUND CARE) ×2 IMPLANT
ELECT REM PT RETURN 15FT ADLT (MISCELLANEOUS) ×2 IMPLANT
FEMORAL PEG DISTAL FIXATION (Orthopedic Implant) ×2 IMPLANT
FEMORAL POST STABILIZED NO 6 (Orthopedic Implant) ×2 IMPLANT
GAUZE SPONGE 4X4 12PLY STRL (GAUZE/BANDAGES/DRESSINGS) ×2 IMPLANT
GAUZE XEROFORM 1X8 LF (GAUZE/BANDAGES/DRESSINGS) ×2 IMPLANT
GLOVE BIO SURGEON STRL SZ7.5 (GLOVE) ×2 IMPLANT
GLOVE BIOGEL PI IND STRL 8 (GLOVE) ×2 IMPLANT
GLOVE BIOGEL PI INDICATOR 8 (GLOVE) ×2
GLOVE ECLIPSE 8.0 STRL XLNG CF (GLOVE) ×2 IMPLANT
GOWN STRL REUS W/TWL XL LVL3 (GOWN DISPOSABLE) ×4 IMPLANT
HANDPIECE INTERPULSE COAX TIP (DISPOSABLE) ×1
HOLDER FOLEY CATH W/STRAP (MISCELLANEOUS) IMPLANT
IMMOBILIZER KNEE 20 (SOFTGOODS) ×4 IMPLANT
IMMOBILIZER KNEE 20 THIGH 36 (SOFTGOODS) ×1 IMPLANT
NDL SAFETY ECLIPSE 18X1.5 (NEEDLE) IMPLANT
NEEDLE HYPO 18GX1.5 SHARP (NEEDLE)
NS IRRIG 1000ML POUR BTL (IV SOLUTION) ×2 IMPLANT
PACK TOTAL KNEE CUSTOM (KITS) ×2 IMPLANT
PAD ABD 8X10 STRL (GAUZE/BANDAGES/DRESSINGS) ×2 IMPLANT
PADDING CAST COTTON 6X4 STRL (CAST SUPPLIES) ×4 IMPLANT
PATELLA 32MMX10MM (Knees) ×2 IMPLANT
PROTECTOR NERVE ULNAR (MISCELLANEOUS) ×2 IMPLANT
SET HNDPC FAN SPRY TIP SCT (DISPOSABLE) ×1 IMPLANT
SET PAD KNEE POSITIONER (MISCELLANEOUS) ×2 IMPLANT
STAPLER VISISTAT 35W (STAPLE) IMPLANT
STRIP CLOSURE SKIN 1/2X4 (GAUZE/BANDAGES/DRESSINGS) IMPLANT
SUT MNCRL AB 4-0 PS2 18 (SUTURE) IMPLANT
SUT VIC AB 0 CT1 27 (SUTURE) ×1
SUT VIC AB 0 CT1 27XBRD ANTBC (SUTURE) ×1 IMPLANT
SUT VIC AB 1 CT1 36 (SUTURE) ×4 IMPLANT
SUT VIC AB 2-0 CT1 27 (SUTURE) ×2
SUT VIC AB 2-0 CT1 TAPERPNT 27 (SUTURE) ×2 IMPLANT
SYR 3ML LL SCALE MARK (SYRINGE) IMPLANT
TRAY FOLEY MTR SLVR 16FR STAT (SET/KITS/TRAYS/PACK) ×2 IMPLANT
WATER STERILE IRR 1000ML POUR (IV SOLUTION) ×2 IMPLANT
WRAP KNEE MAXI GEL POST OP (GAUZE/BANDAGES/DRESSINGS) ×2 IMPLANT
YANKAUER SUCT BULB TIP 10FT TU (MISCELLANEOUS) ×2 IMPLANT

## 2018-11-15 NOTE — Progress Notes (Signed)
AssistedDr. Rob Fitzgerald with right, ultrasound guided, adductor canal block. Side rails up, monitors on throughout procedure. See vital signs in flow sheet. Tolerated Procedure well.  

## 2018-11-15 NOTE — Telephone Encounter (Signed)
Left message for patient's son, Will, advising that Dr. Geraldo Pitter has cleared the patient for surgery scheduled for today. Per Angie Fava, RN the clearance form has been signed and faxed back to the surgeon's office. Advised Will to contact our office with any further questions or concerns.

## 2018-11-15 NOTE — Addendum Note (Signed)
Addendum  created 11/15/18 2049 by Lollie Sails, CRNA   Charge Capture section accepted

## 2018-11-15 NOTE — Transfer of Care (Signed)
Immediate Anesthesia Transfer of Care Note  Patient: [name] [name]  Procedure(s) Performed: Procedure(s): RIGHT TOTAL KNEE ARTHROPLASTY (Right)  Patient Location: PACU  Anesthesia Type:General  Level of Consciousness:  sedated, patient cooperative and responds to stimulation  Airway & Oxygen Therapy:Patient Spontanous Breathing and Patient connected to face mask oxgen  Post-op Assessment:  Report given to PACU RN and Post -op Vital signs reviewed and stable  Post vital signs:  Reviewed and stable  Last Vitals:  Vitals:   11/15/18 1148 11/15/18 1150  BP:    Pulse: (!) 59 60  Resp: 19 14  Temp:    SpO2: 132% 440%    Complications: No apparent anesthesia complications

## 2018-11-15 NOTE — Anesthesia Postprocedure Evaluation (Signed)
Anesthesia Post Note  Patient: [name] [name]  Procedure(s) Performed: RIGHT TOTAL KNEE ARTHROPLASTY (Right Knee)     Patient location during evaluation: PACU Anesthesia Type: General Level of consciousness: awake and alert Pain management: pain level controlled Vital Signs Assessment: post-procedure vital signs reviewed and stable Respiratory status: spontaneous breathing, nonlabored ventilation, respiratory function stable and patient connected to nasal cannula oxygen Cardiovascular status: blood pressure returned to baseline and stable Postop Assessment: no apparent nausea or vomiting Anesthetic complications: no    Last Vitals:  Vitals:   11/15/18 1615 11/15/18 1630  BP: (!) 135/91 134/86  Pulse: 79 68  Resp: 10 11  Temp:  36.7 C  SpO2: 95% 96%    Last Pain:  Vitals:   11/15/18 1630  TempSrc:   PainSc: [name]

## 2018-11-15 NOTE — Plan of Care (Signed)
  Problem: Education: Goal: Knowledge of the prescribed therapeutic regimen will improve Outcome: Progressing Goal: Individualized Educational Video(s) Outcome: Progressing   Problem: Activity: Goal: Ability to avoid complications of mobility impairment will improve Outcome: Progressing   

## 2018-11-15 NOTE — Anesthesia Procedure Notes (Signed)
Procedure Name: Intubation Date/Time: 11/15/2018 12:31 PM Performed by: Niel Hummer, CRNA Pre-anesthesia Checklist: Patient being monitored, Suction available, Emergency Drugs available and Patient identified Patient Re-evaluated:Patient Re-evaluated prior to induction Oxygen Delivery Method: Circle system utilized Preoxygenation: Pre-oxygenation with 100% oxygen Induction Type: IV induction Ventilation: Mask ventilation without difficulty and Oral airway inserted - appropriate to patient size Laryngoscope Size: Sabra Heck and 2 Grade View: Grade II Tube type: Oral Tube size: 7.5 mm Number of attempts: 1 Airway Equipment and Method: Stylet Placement Confirmation: ETT inserted through vocal cords under direct vision,  positive ETCO2 and breath sounds checked- equal and bilateral Secured at: 22 cm Tube secured with: Tape Dental Injury: Teeth and Oropharynx as per pre-operative assessment  Comments: MAC 4 used, grade 3 view unable to pass tube with bougie. Masked pt. Grade II view by MDA, successful tube pass.

## 2018-11-15 NOTE — H&P (Signed)
TOTAL KNEE ADMISSION H&P  Patient is being admitted for right total knee arthroplasty.  Subjective:  Chief Complaint:right knee pain.  HPI: [name] [name], 67 y.o. male, has a history of pain and functional disability in the right knee due to arthritis and has failed non-surgical conservative treatments for greater than 12 weeks to includeNSAID's and/or analgesics, corticosteriod injections, viscosupplementation injections, flexibility and strengthening excercises, use of assistive devices and activity modification.  Onset of symptoms was gradual, starting 4 years ago with gradually worsening course since that time. The patient noted no past surgery on the right knee(s).  Patient currently rates pain in the right knee(s) at 10 out of 10 with activity. Patient has night pain, worsening of pain with activity and weight bearing, pain that interferes with activities of daily living, pain with passive range of motion, crepitus and joint swelling.  Patient has evidence of subchondral cysts, subchondral sclerosis, periarticular osteophytes and joint space narrowing by imaging studies. There is no active infection.  Patient Active Problem List   Diagnosis Date Noted  . Unilateral primary osteoarthritis, right knee 11/15/2018  . Depressive disorder 11/12/2018  . 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 History of colonic polyps 11/12/2018  . History of nephrolithiasis 11/12/2018  . History of smoking 11/12/2018  . 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 Medication intolerance 11/12/2018  . Old myocardial infarction 11/12/2018  . Essential hypertension 11/12/2018  . Mixed hyperlipidemia 11/12/2018  . Pre-operative cardiovascular examination 11/12/2018  . CAD (coronary artery disease) 11/12/2018  . Bilateral leg edema 05/04/2017  . Primary osteoarthritis of both knees 03/05/2017  . Type II diabetes mellitus  with manifestations ([place]) 08/18/2016  . CAP (community acquired pneumonia) 08/17/2016  . Right carotid bruit 08/05/2014  . HYPERLIPIDEMIA-MIXED 06/15/2009  . HYPERTENSION, BENIGN 06/15/2009  . CAD, NATIVE VESSEL 06/15/2009  . Gout 10/14/2007   Past Medical History:  Diagnosis Date  . 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 DM (diabetes mellitus) ([place])   . HTN (hypertension)   . Hyperlipidemia     Past Surgical History:  Procedure Laterality Date  . CORONARY ANGIOPLASTY WITH STENT PLACEMENT    . LAPAROSCOPIC INGUINAL HERNIA REPAIR      Current Facility-Administered Medications  Medication Dose Route Frequency Provider Last Rate Last Dose  . ceFAZolin (ANCEF) IVPB 2g/100 mL premix  2 g Intravenous On Call to OR Pete Pelt, PA-C      . chlorhexidine (HIBICLENS) 4 % liquid 4 application  60 mL Topical Once Erskine Emery W, PA-C      . fentaNYL (SUBLIMAZE) 100 MCG/2ML injection           . fentaNYL (SUBLIMAZE) injection 50-100 mcg  50-100 mcg Intravenous Rober Minion, MD      . lactated ringers infusion   Intravenous Continuous Suzette Battiest, MD 20 mL/hr at 11/15/18 [phone]    . midazolam (VERSED) 2 MG/2ML injection           . midazolam (VERSED) injection 1-2 mg  1-2 mg Intravenous Rober Minion, MD      . tranexamic acid (CYKLOKAPRON) IVPB 1,000 mg  1,000 mg Intravenous To OR Pete Pelt, PA-C       Allergies  Allergen Reactions  . Allopurinol Other (See Comments)    Felt terrible  . Aripiprazole  Other (See Comments)    Felt terrible  . Bupropion Other (See Comments)    Felt bad  . Buspirone Other (See Comments)    Felt bad  . Desvenlafaxine Other (See Comments)    Made him hyper  . Duloxetine Other (See Comments)    Knocked him out  . Escitalopram Other (See Comments)    Felt like crap  . Fish Oil   . Fluticasone Furoate Other (See Comments)     Unknown   . Hydroxyzine Hcl Other (See Comments)    Knocked him out  . Nortriptyline Other (See Comments)    Ineffective  . Paroxetine Hcl Other (See Comments)    unknown  . Ramipril Cough  . Sertraline Other (See Comments)    "felt plugged in"  . Venlafaxine Other (See Comments)    Felt bad  . Vilazodone Other (See Comments)    "felt weird"    Social History   Tobacco Use  . Smoking status: Former Smoker    Packs/day: 0.50    Years: 10.00    Pack years: 5.00  . Smokeless tobacco: Former User    Types: Snuff, Sarina Ser    Quit date: 11/20/1968  Substance Use Topics  . Alcohol use: No    Family History  Problem Relation Age of Onset  . Hypertension Mother   . Cancer Mother   . Heart attack Father   . Hypertension Father   . Diabetes Father   . Hypertension Brother   . Irregular heart beat Brother      Review of Systems  Musculoskeletal: Positive for joint pain.  All other systems reviewed and are negative.   Objective:  Physical Exam  Constitutional: He is oriented to person, place, and time. He appears well-developed and well-nourished.  HENT:  Head: Normocephalic and atraumatic.  Eyes: Pupils are equal, round, and reactive to light.  Neck: Normal range of motion.  Cardiovascular: Normal rate.  Respiratory: Effort normal.  GI: Soft.  Musculoskeletal:     Right knee: He exhibits decreased range of motion, swelling, effusion, abnormal alignment, bony tenderness and abnormal meniscus. Tenderness found. Medial joint line and lateral joint line tenderness noted.  Neurological: He is alert and oriented to person, place, and time.  Skin: Skin is warm and dry.  Psychiatric: He has a normal mood and affect.    Vital signs in last 24 hours: Temp:  [98.1 F (36.7 C)] 98.1 F (36.7 C) (12/27 0839) Pulse Rate:  [71] 71 (12/27 0839) Resp:  [12] 12 (12/27 0839) BP: (145)/(88) 145/88 (12/27 0839) SpO2:  [96 %] 96 % (12/27 0839) Weight:  [98.4 kg] 98.4 kg (12/27  0853)  Labs:   Estimated body mass index is 28.63 kg/m as calculated from the following:   Height as of this encounter: 6\' 1"  (1.854 m).   Weight as of this encounter: 98.4 kg.   Imaging Review Plain radiographs demonstrate severe degenerative joint disease of the right knee(s). The overall alignment ismild varus. The bone quality appears to be good for age and reported activity level.   Preoperative templating of the joint replacement has been completed, documented, and submitted to the Operating Room personnel in order to optimize intra-operative equipment management.   Anticipated LOS equal to or greater than 2 midnights due to - Age 58 and older with one or more of the following:  - Obesity  - Expected need for hospital services (PT, OT, Nursing) required for safe  discharge  - Anticipated need for  postoperative skilled nursing care or inpatient rehab  - Active co-morbidities: Coronary Artery Disease OR   - Unanticipated findings during/Post Surgery: None  - Patient is a high risk of re-admission due to: None     Assessment/Plan:  End stage arthritis, right knee   The patient history, physical examination, clinical judgment of the provider and imaging studies are consistent with end stage degenerative joint disease of the right knee(s) and total knee arthroplasty is deemed medically necessary. The treatment options including medical management, injection therapy arthroscopy and arthroplasty were discussed at length. The risks and benefits of total knee arthroplasty were presented and reviewed. The risks due to aseptic loosening, infection, stiffness, patella tracking problems, thromboembolic complications and other imponderables were discussed. The patient acknowledged the explanation, agreed to proceed with the plan and consent was signed. Patient is being admitted for inpatient treatment for surgery, pain control, PT, OT, prophylactic antibiotics, VTE prophylaxis,  progressive ambulation and ADL's and discharge planning. The patient is planning to be discharged home with home health services

## 2018-11-15 NOTE — Anesthesia Procedure Notes (Signed)
Anesthesia Regional Block: Adductor canal block   Pre-Anesthetic Checklist: ,, timeout performed, Correct Patient, Correct Site, Correct Laterality, Correct Procedure, Correct Position, site marked, Risks and benefits discussed,  Surgical consent,  Pre-op evaluation,  At surgeon's request and post-op pain management  Laterality: Right  Prep: chloraprep       Needles:  Injection technique: Single-shot  Needle Type: Echogenic Needle     Needle Length: 9cm  Needle Gauge: 21     Additional Needles:   Procedures:,,,, ultrasound used (permanent image in chart),,,,  Narrative:  Start time: 11/15/2018 10:55 AM End time: 11/15/2018 11:02 AM Injection made incrementally with aspirations every 5 mL.  Performed by: Personally  Anesthesiologist: Suzette Battiest, MD

## 2018-11-15 NOTE — Brief Op Note (Signed)
11/15/2018  2:20 PM  PATIENT:  [name]  67 y.o. male  PRE-OPERATIVE DIAGNOSIS:  right knee osteoarthritis  POST-OPERATIVE DIAGNOSIS:  right knee osteoarthritis  PROCEDURE:  Procedure(s): RIGHT TOTAL KNEE ARTHROPLASTY (Right)  SURGEON:  Surgeon(s) and Role:    Mcarthur Rossetti, MD - Primary  ANESTHESIA:   regional and general  EBL:  50 mL   COUNTS:  YES  TOURNIQUET:   Total Tourniquet Time Documented: Thigh (Right) - 67 minutes Total: Thigh (Right) - 67 minutes   DICTATION: .Other Dictation: Dictation Number [id number]  PLAN OF CARE: Admit to inpatient   PATIENT DISPOSITION:  PACU - hemodynamically stable.   Delay start of Pharmacological VTE agent (>24hrs) due to surgical blood loss or risk of bleeding: no

## 2018-11-16 LAB — CBC
HCT: 41.3 % (ref 39.0–52.0)
Hemoglobin: 12.6 g/dL — ABNORMAL LOW (ref 13.0–17.0)
MCH: 27.6 pg (ref 26.0–34.0)
MCHC: 30.5 g/dL (ref 30.0–36.0)
MCV: 90.6 fL (ref 80.0–100.0)
Platelets: 144 10*3/uL — ABNORMAL LOW (ref 150–400)
RBC: 4.56 MIL/uL (ref 4.22–5.81)
RDW: 13.4 % (ref 11.5–15.5)
WBC: 9 10*3/uL (ref 4.0–10.5)
nRBC: 0 % (ref 0.0–0.2)

## 2018-11-16 LAB — GLUCOSE, CAPILLARY
GLUCOSE-CAPILLARY: 231 mg/dL — AB (ref 70–99)
GLUCOSE-CAPILLARY: 120 mg/dL — AB (ref 70–99)
Glucose-Capillary: 169 mg/dL — ABNORMAL HIGH (ref 70–99)
Glucose-Capillary: 100 mg/dL — ABNORMAL HIGH (ref 70–99)

## 2018-11-16 LAB — BASIC METABOLIC PANEL
Anion gap: 12 (ref 5–15)
BUN: 24 mg/dL — ABNORMAL HIGH (ref 8–23)
CHLORIDE: 103 mmol/L (ref 98–111)
CO2: 24 mmol/L (ref 22–32)
Calcium: 8.7 mg/dL — ABNORMAL LOW (ref 8.9–10.3)
Creatinine, Ser: 1.4 mg/dL — ABNORMAL HIGH (ref 0.61–1.24)
GFR calc Af Amer: >60 mL/min (ref >60)
GFR calc non Af Amer: 52 mL/min — ABNORMAL LOW (ref >60)
Glucose, Bld: 280 mg/dL — ABNORMAL HIGH (ref 70–99)
POTASSIUM: 4.4 mmol/L (ref 3.5–5.1)
SODIUM: 139 mmol/L (ref 135–145)

## 2018-11-16 NOTE — Evaluation (Signed)
Physical Therapy Evaluation Patient Details Name: [name] [name] MRN: [id number] DOB: Apr 20, 1951 Today's Date: 11/16/2018   History of Present Illness  s/p R TKA  Clinical Impression  Pt admitted with above diagnosis. Pt currently with functional limitations due to the deficits listed below (see PT Problem List).  Pt limited by pain today, will benefit from SNF to allow return to independence;  Pt will benefit from skilled PT to increase their independence and safety with mobility to allow discharge to the venue listed below.       Follow Up Recommendations SNF    Equipment Recommendations  None recommended by PT    Recommendations for Other Services       Precautions / Restrictions Precautions Precautions: Knee Required Braces or Orthoses: Knee Immobilizer - Right Knee Immobilizer - Right: Discontinue once straight leg raise with < 10 degree lag Restrictions Weight Bearing Restrictions: No      Mobility  Bed Mobility Overal bed mobility: Needs Assistance Bed Mobility: Supine to Sit     Supine to sit: Min assist     General bed mobility comments: assist with RLE  Transfers Overall transfer level: Needs assistance Equipment used: Rolling walker (2 wheeled) Transfers: Sit to/from [facility] Sit to Stand: Min assist Stand pivot transfers: Min assist       General transfer comment: cues for safety and hand placement, incr time d/t pain  Ambulation/Gait             General Gait Details: unable d/t pain (has not had meds despite multiple attempts to acquire them)  Stairs            Wheelchair Mobility    Modified Rankin (Stroke Patients Only)       Balance Overall balance assessment: Needs assistance;History of Falls   Sitting balance-Leahy Scale: Good       Standing balance-Leahy Scale: Poor Standing balance comment: reliant on UEs; recent fall on stairs entering his home                              Pertinent Vitals/Pain Pain Assessment: 0-10 Pain Score: 9  Pain Location: right knee Pain Descriptors / Indicators: Discomfort;Grimacing;Guarding Pain Intervention(s): Limited activity within patient's tolerance;Monitored during session;Repositioned;Patient requesting pain meds-RN notified;Ice applied    Home Living Family/patient expects to be discharged to:: Skilled nursing facility Living Arrangements: Alone   Type of Home: House Home Access: Stairs to enter Entrance Stairs-Rails: Psychiatric nurse of Steps: 4 Home Layout: Two level;Bed/bath upstairs Home Equipment: None      Prior Function Level of Independence: Independent               Hand Dominance        Extremity/Trunk Assessment   Upper Extremity Assessment Upper Extremity Assessment: Defer to OT evaluation    Lower Extremity Assessment Lower Extremity Assessment: RLE deficits/detail RLE Deficits / Details: ankle WFL; knee ROM ~15* to 25*, limited by pain RLE: Unable to fully assess due to pain       Communication   Communication: No difficulties  Cognition Arousal/Alertness: Awake/alert Behavior During Therapy: WFL for tasks assessed/performed Overall Cognitive Status: Within Functional Limits for tasks assessed                                        General Comments  Exercises Total Joint Exercises Ankle Circles/Pumps: 15 reps;AROM;Both Quad Sets: AROM;5 reps;Both;Limitations Quad Sets Limitations: pain   Assessment/Plan    PT Assessment Patient needs continued PT services  PT Problem List Decreased strength;Decreased range of motion;Decreased activity tolerance;Decreased mobility;Pain;Decreased knowledge of use of DME       PT Treatment Interventions Gait training;DME instruction;Functional mobility training;Therapeutic activities;Balance training;Patient/family education;Therapeutic exercise    PT Goals (Current goals can be found in the  Care Plan section)  Acute Rehab PT Goals Patient Stated Goal: home PT Goal Formulation: With patient Time For Goal Achievement: 11/23/18 Potential to Achieve Goals: Good    Frequency 7X/week   Barriers to discharge        Co-evaluation               AM-PAC PT "6 Clicks" Mobility  Outcome Measure Help needed turning from your back to your side while in a flat bed without using bedrails?: A Little Help needed moving from lying on your back to sitting on the side of a flat bed without using bedrails?: A Little Help needed moving to and from a bed to a chair (including a wheelchair)?: A Little Help needed standing up from a chair using your arms (e.g., wheelchair or bedside chair)?: A Little Help needed to walk in hospital room?: A Lot Help needed climbing 3-5 steps with a railing? : A Lot 6 Click Score: 16    End of Session Equipment Utilized During Treatment: Gait belt Activity Tolerance: Patient tolerated treatment well Patient left: in chair;with call bell/phone within reach;with chair alarm set   PT Visit Diagnosis: History of falling (Z91.81);Difficulty in walking, not elsewhere classified (R26.2)    Time: 8921-1941 PT Time Calculation (min) (ACUTE ONLY): 29 min   Charges:   PT Evaluation $PT Eval Low Complexity: 1 Low PT Treatments $Therapeutic Activity: 8-22 mins        Kenyon Ana, PT  Pager: [phone] Acute Rehab Dept [facility]): [phone]   11/16/2018   [facility] 11/16/2018, 12:17 PM

## 2018-11-16 NOTE — Progress Notes (Signed)
Subjective: 1 Day Post-Op Procedure(s) (LRB): RIGHT TOTAL KNEE ARTHROPLASTY (Right) Patient reports pain as moderate.    Objective: Vital signs in last 24 hours: Temp:  [97.7 F (36.5 C)-98.3 F (36.8 C)] 98 F (36.7 C) (12/28 0953) Pulse Rate:  [56-99] 79 (12/28 0953) Resp:  [6-20] 16 (12/28 0953) BP: (118-144)/(68-99) 118/82 (12/28 0953) SpO2:  [95 %-100 %] 99 % (12/28 0953)  Intake/Output from previous day: 12/27 0701 - 12/28 0700 In: 4039.2 [P.O.:1080; I.V.:2709.2; IV Piggyback:250] Out: 1050 [Urine:1000; Blood:50] Intake/Output this shift: No intake/output data recorded.  Recent Labs    11/16/18 0349  HGB 12.6*   Recent Labs    11/16/18 0349  WBC 9.0  RBC 4.56  HCT 41.3  PLT 144*   Recent Labs    11/16/18 0349  NA 139  K 4.4  CL 103  CO2 24  BUN 24*  CREATININE 1.40*  GLUCOSE 280*  CALCIUM 8.7*   No results for input(s): LABPT, INR in the last 72 hours.  Intact pulses distally Dorsiflexion/Plantar flexion intact Incision: dressing C/D/I No cellulitis present Compartment soft  Anticipated LOS equal to or greater than 2 midnights due to - Age 36 and older with one or more of the following:  - Obesity  - Expected need for hospital services (PT, OT, Nursing) required for safe  discharge  - Anticipated need for postoperative skilled nursing care or inpatient rehab  - Active co-morbidities: Coronary Artery Disease OR   - Unanticipated findings during/Post Surgery: None  - Patient is a high risk of re-admission due to: None   Assessment/Plan: 1 Day Post-Op Procedure(s) (LRB): RIGHT TOTAL KNEE ARTHROPLASTY (Right) Up with therapy Discharge to [facility] Monday.    Mcarthur Rossetti 11/16/2018, 11:21 AM

## 2018-11-16 NOTE — Progress Notes (Signed)
   11/16/18 1520  PT Visit Information  Last PT Received On 11/16/18  Assistance Needed +1  History of Present Illness s/p R TKA  Subjective Data  Patient Stated Goal home  Precautions  Precautions Knee  Required Braces or Orthoses Knee Immobilizer - Right  Knee Immobilizer - Right Discontinue once straight leg raise with < 10 degree lag  Restrictions  Weight Bearing Restrictions No  Other Position/Activity Restrictions WBAT  Pain Assessment  Pain Assessment 0-10  Pain Score 8  Pain Location right knee  Pain Descriptors / Indicators Discomfort;Grimacing;Guarding  Pain Intervention(s) Limited activity within patient's tolerance;Monitored during session;Premedicated before session;Repositioned;Ice applied  Cognition  Arousal/Alertness Awake/alert  Behavior During Therapy WFL for tasks assessed/performed  Overall Cognitive Status Within Functional Limits for tasks assessed  Bed Mobility  Bed Mobility Sit to Supine  Sit to supine Min assist  General bed mobility comments assist with RLE, incr time  Transfers  Overall transfer level Needs assistance  Equipment used Rolling walker (2 wheeled)  Transfers Sit to/from Stand  Sit to Stand Min assist  General transfer comment light steadying assist, cues for hand placement and RLE position  Ambulation/Gait  Ambulation/Gait assistance Min assist;Min guard  Gait Distance (Feet) 40 Feet  Assistive device Rolling walker (2 wheeled)  Gait Pattern/deviations Step-to pattern;Antalgic  General Gait Details cues for sequence and RW position  Total Joint Exercises  Ankle Circles/Pumps AROM;Both;5 reps  Quad Sets AROM;Both;5 reps  Quad Sets Limitations pain  Heel Slides AAROM;Right;10 reps;Limitations  Heel Slides Limitations pain; grossly -10* to 35*  knee flexion  PT - End of Session  Equipment Utilized During Treatment Gait belt  Activity Tolerance Patient tolerated treatment well;Patient limited by pain  Patient left in bed;with call  bell/phone within reach;with bed alarm set   PT - Assessment/Plan  PT Plan Current plan remains appropriate  PT Visit Diagnosis History of falling (Z91.81);Difficulty in walking, not elsewhere classified (R26.2)  PT Frequency (ACUTE ONLY) 7X/week  Follow Up Recommendations SNF  PT equipment None recommended by PT  AM-PAC PT "6 Clicks" Mobility Outcome Measure (Version 2)  Help needed turning from your back to your side while in a flat bed without using bedrails? 3  Help needed moving from lying on your back to sitting on the side of a flat bed without using bedrails? 3  Help needed moving to and from a bed to a chair (including a wheelchair)? 3  Help needed standing up from a chair using your arms (e.g., wheelchair or bedside chair)? 3  Help needed to walk in hospital room? 3  Help needed climbing 3-5 steps with a railing?  2  6 Click Score 17  Consider Recommendation of Discharge To: Home with [facility]  PT Goal Progression  Progress towards PT goals Progressing toward goals (slowly)  Acute Rehab PT Goals  PT Goal Formulation With patient  Time For Goal Achievement 11/23/18  Potential to Achieve Goals Good  PT Time Calculation  PT Start Time (ACUTE ONLY) 1451  PT Stop Time (ACUTE ONLY) 1519  PT Time Calculation (min) (ACUTE ONLY) 28 min  PT General Charges  $$ ACUTE PT VISIT 1 Visit  PT Treatments  $Gait Training 8-22 mins

## 2018-11-16 NOTE — Plan of Care (Signed)
Plan of care reviewed and discussed with the patient. 

## 2018-11-16 NOTE — Op Note (Signed)
NAME: [name] [name], CAHALL MEDICAL RECORD [id number] ACCOUNT [id number] DATE OF BIRTH:1951-08-10 FACILITY: [facility] LOCATION: [facility] PHYSICIAN:Dagon Budai [place], MD  OPERATIVE REPORT  DATE OF PROCEDURE:  11/15/2018  PREOPERATIVE DIAGNOSES:   1.  Severe osteoarthritis and degenerative joint disease, right knee. 2.  Small inflamed ingrown hair, sebaceous cyst, right distal lateral femur.  POSTOPERATIVE DIAGNOSES:   1.  Severe osteoarthritis and degenerative joint disease, right knee. 2.  Small inflamed ingrown hair, sebaceous cyst, right distal lateral femur.  PROCEDURE: 1.  Right total knee arthroplasty. 2.  Removal of small superficial lesion, right thigh.  SURGEON:  Lind Guest.  Ninfa Linden, MD  ANESTHESIA: 1.  Right lower extremity adductor canal block. 2.  General.  ANTIBIOTICS:  Two grams IV Ancef.  ESTIMATED BLOOD LOSS:  Less than 200 mL  TOURNIQUET TIME:  Less than an hour and a half.  COMPLICATIONS:  None.  IMPLANTS:  Stryker triathlon knee system with size 5 tibia universal baseplate, size 6 right femur,  11 mm fixed bearing polyethylene insert, size 32 cemented patellar button  Danija Gosa.  COMPLICATIONS:  None.  INDICATIONS:  The patient is a very pleasant 67 year old gentleman with debilitating arthritis involving both of his knees with the right much worse than his left.  He has had remote surgery on that right knee as well.  We followed him for a long period  of time and at this point he has tried and failed all forms of conservative treatment.  At this point, he does wish to proceed with total knee arthroplasty on the right knee.  We had obtained cardiac clearance for the surgery and he still is considered a  significant risk for coronary events, but he feels at this point that his pain is so significant and his disability so significant, he does wish to proceed with total knee arthroplasty.    He understands these risks fully.  He also understands the  risk of acute blood loss anemia, nerve or vessel injury, fracture, infection, DVT, and implant failure.  He understands our goals are to decrease pain, improve mobility and improve quality of  life.    In the holding room, I did note there was some type of inflamed lesion on the lateral side near where we make an incision.  It did not appear to be infected, but it was certainly red and had been picking at it all week.  It does not appear to be a skin  cancer but more of a potential ingrown hair I told him I would like either delay surgery or would proceed with surgery, a second I and D to remove this lesion in the same setting, which he agreed to because he really wants to have the surgery done today  and I agree and again it does not appear to be infected.  DESCRIPTION OF PROCEDURE:  After informed consent was obtained and appropriate right leg was marked and anesthesia obtained an adductor canal block in the holding room.  He was then brought to the operating room and placed supine on the operating table.   General anesthesia was then obtained.  A nonsterile tourniquet was placed on his upper right thigh and his right thigh, knee, leg and ankle were prepped and draped with DuraPrep and sterile drapes in a sterile stockinette.  Time-out was called to  identify correct patient, correct right knee.  I then used an Esmarch to wrap that leg and tourniquet was inflated to 300 mm of pressure.  I then used a #  10 blade and ellipsed out the lesion on the anterolateral aspect of his right thigh.  This appeared  benign and there was no evidence of infection.  I irrigated the soft tissue and then reapproximated the skin with interrupted 2-0 nylon suture.  I then made a direct midline incision over the patella and carried this proximally and distally.  I dissected  down the knee joint carried out.  A medial parapatellar arthrotomy, finding very large joint effusion and significant synovitis throughout the knee and  severe end-stage arthritis involving all 3 compartments with periarticular osteophytes and complete  loss of his cartilage in the knee.  He can also tell, he had previous lateral medial reconstructive surgery of his ligaments.  With the knee in a flexed position, I removed remnants of ACL, PCL, medial and lateral meniscus and osteophytes throughout the  knee.  We then used the extramedullary cutting guide for making our proximal tibia cut.  We corrected for varus and valgus off of this and a neutral slope and made the cut, taking 2 mm off the high side.  We then used an intramedullary drill in the  intercondylar area of the knee for using our distal femoral cutting guide, setting this for a 10 mm distal femoral cut based off the flexion contracture  he had preoperatively, setting this for a right knee at 5 degrees externally rotated.  We made that  cut without difficulty and brought the knee back down to full extension with a 9 mm extension block and obtained full extension.  We then went back to the femur and put our femoral sizing guide based off the epicondylar axis.  Based on this, we chose a  size 6 femur.  We put a 4-in-1 cutting block for a size 6 femur, made our anterior and posterior cuts followed by our chamfer cuts and then we made our femoral box cut.  We then went back to the tibia and chose a size 5 tibia for coverage.  Based on  previous ligament issues of his knee and the bone plate, I thought a universal baseplate would be better.  We set our rotation of the tibia tubercle and the femur and then drilled for a size 5 universal baseplate and did our keel punch off of this.  We  then trialed the tibia, size 5 tray, followed by the size 6 femur and then a 9 and an 11 mm fixed bearing trial polyethylene insert and I was pleased with stability and range of motion of the size 11 insert.  We then made our patellar cut and drilled 3  holes for a size 32 patellar button.  We then removed all  instrumentation from the knee and irrigated the knee with normal saline solution using pulsatile lavage.  We mixed our cement and dried the knee well.  With the knee in a flexed position, we then  cemented our size 5 universal tibial baseplate, followed by our size 6 right femur.  We removed cement debris from the knee and placed our fixed bearing polyethylene insert, size 11, and cemented our patellar button.  Once the cement had hardened, the  tourniquet was let down.  Hemostasis obtained with electrocautery.  We then closed the arthrotomy with interrupted #1 Vicryl suture, followed by 0 Vicryl in the deep tissue, 2-0 Vicryl subcutaneous tissue and interrupted staples on the skin.  Xeroform  well-padded sterile dressing was applied.    He was awakened, extubated, and taken to recovery room in stable  condition.  All final counts were correct.  There were no complications noted.  AN/NUANCE  D:11/15/2018 T:11/16/2018 JOB:[id number]

## 2018-11-16 NOTE — Evaluation (Signed)
Occupational Therapy Evaluation Patient Details Name: [name] [name] MRN: [id number] DOB: May 21, 1951 Today's Date: 11/16/2018    History of Present Illness (P) s/p R TKA   Clinical Impression   This 67 year old man was admitted for the above. At baseline, he is independent, but he does report a fall where knee buckled.  He needs min A for mobility and up to max A for LB adls. He lives alone and does not have help upon d/c. Goals in acute are for supervision level, and recommend continued OT at SNF after d/c from hospital     Follow Up Recommendations  SNF    Equipment Recommendations  3 in 1 bedside commode    Recommendations for Other Services       Precautions / Restrictions Precautions Precautions: (P) Knee Required Braces or Orthoses: (P) Knee Immobilizer - Right Knee Immobilizer - Right: (P) Discontinue once straight leg raise with < 10 degree lag Restrictions Weight Bearing Restrictions: (P) No Other Position/Activity Restrictions: (P) WBAT      Mobility Bed Mobility Overal bed mobility: Needs Assistance Bed Mobility: (P) Sit to Supine     Supine to sit: Min assist     General bed mobility comments: oob by PT  Transfers Overall transfer level: Needs assistance Equipment used: Rolling walker (2 wheeled) Transfers: Sit to/from [facility] Sit to Stand: Min assist Stand pivot transfers: Min assist       General transfer comment: light steadying assist    Balance Overall balance assessment: Needs assistance;History of Falls   Sitting balance-Leahy Scale: Good       Standing balance-Leahy Scale: Poor Standing balance comment: reliant on UEs; recent fall on stairs entering his home                           ADL either performed or assessed with clinical judgement   ADL Overall ADL's : Needs assistance/impaired             Lower Body Bathing: Moderate assistance;Sit to/from stand       Lower Body Dressing:  Maximal assistance;Sit to/from stand   Toilet Transfer: Minimal assistance;Ambulation;RW(back to bed)             General ADL Comments: pt can perform UB adls with set up.  Limited by pain, so seen in 2 parts today. Would benefit from AE; did not introduce on this visit. Pt is able to lift RLE.  Reinforced knee precautions     Vision         Perception     Praxis      Pertinent Vitals/Pain Pain Assessment: (P) 0-10 Pain Score: (P) 8  Pain Location: (P) right knee Pain Descriptors / Indicators: (P) Discomfort;Grimacing;Guarding Pain Intervention(s): (P) Limited activity within patient's tolerance;Monitored during session;Premedicated before session;Repositioned;Ice applied     Hand Dominance     Extremity/Trunk Assessment Upper Extremity Assessment Upper Extremity Assessment: Overall WFL for tasks assessed          Communication Communication Communication: No difficulties   Cognition Arousal/Alertness: (P) Awake/alert Behavior During Therapy: (P) WFL for tasks assessed/performed Overall Cognitive Status: (P) Within Functional Limits for tasks assessed                                     General Comments       Exercises Total Joint Exercises Ankle Circles/Pumps:  15 reps;AROM;Both Quad Sets: AROM;5 reps;Both;Limitations Quad Sets Limitations: pain   Shoulder Instructions      Home Living Family/patient expects to be discharged to:: Skilled nursing facility Living Arrangements: Alone                               Additional Comments: has tub shower and standard commode at home      Prior Functioning/Environment Level of Independence: Independent        Comments: reports a fall when L knee gave out        OT Problem List: Decreased strength;Decreased activity tolerance;Pain;Decreased knowledge of use of DME or AE;Decreased knowledge of precautions      OT Treatment/Interventions: Self-care/ADL training;DME and/or AE  instruction;Therapeutic activities;Patient/family education    OT Goals(Current goals can be found in the care plan section) Acute Rehab OT Goals Patient Stated Goal: (P) home OT Goal Formulation: With patient Time For Goal Achievement: 11/23/18 Potential to Achieve Goals: Good ADL Goals Pt Will Perform Grooming: with supervision;standing Pt Will Perform Lower Body Bathing: with adaptive equipment;sit to/from stand;with supervision Pt Will Perform Lower Body Dressing: with min assist;sit to/from stand;with adaptive equipment Pt Will Transfer to Toilet: with supervision;ambulating;bedside commode Pt Will Perform Toileting - Clothing Manipulation and hygiene: with supervision;sit to/from stand  OT Frequency: Min 2X/week   Barriers to D/C: Decreased caregiver support          Co-evaluation              AM-PAC OT "6 Clicks" Daily Activity     Outcome Measure Help from another person eating meals?: None Help from another person taking care of personal grooming?: A Little Help from another person toileting, which includes using toliet, bedpan, or urinal?: A Little Help from another person bathing (including washing, rinsing, drying)?: A Lot Help from another person to put on and taking off regular upper body clothing?: A Little Help from another person to put on and taking off regular lower body clothing?: A Lot 6 Click Score: 17   End of Session    Activity Tolerance: Patient tolerated treatment well Patient left: in chair;with call bell/phone within reach;with chair alarm set  OT Visit Diagnosis: Pain Pain - Right/Left: Right Pain - part of body: Knee                Time: 1351-1411 OT Time Calculation (min): 20 min Charges:  OT General Charges $OT Visit: 1 Visit OT Evaluation $OT Eval Low Complexity: [place], OTR/L Acute Rehabilitation Services [phone] [facility] pager [phone] office 11/16/2018  Kailoni Vahle 11/16/2018, 3:23 PM

## 2018-11-17 LAB — GLUCOSE, CAPILLARY
Glucose-Capillary: 161 mg/dL — ABNORMAL HIGH (ref 70–99)
Glucose-Capillary: 150 mg/dL — ABNORMAL HIGH (ref 70–99)
Glucose-Capillary: 148 mg/dL — ABNORMAL HIGH (ref 70–99)
Glucose-Capillary: 111 mg/dL — ABNORMAL HIGH (ref 70–99)
Glucose-Capillary: 114 mg/dL — ABNORMAL HIGH (ref 70–99)

## 2018-11-17 NOTE — NC FL2 (Signed)
[place] MEDICAID FL2 LEVEL OF CARE SCREENING TOOL     IDENTIFICATION  Patient Name: [name] [name] Birthdate: 20-Jun-1951 Sex: male Admission Date (Current Location): 11/15/2018  [facility] and [place] Number:  Herbalist and Address:  [facility],  [address], [place]      Provider Number: [id number]  Attending Physician Name and Address:  Mcarthur Rossetti  Relative Name and Phone Number:  Will Mabe: [phone]    Current Level of Care: Hospital Recommended Level of Care: [place] Prior Approval Number:    Date Approved/Denied:   PASRR Number: Pending  Discharge Plan: SNF    Current Diagnoses: Patient Active Problem List   Diagnosis Date Noted  . Unilateral primary osteoarthritis, right knee 11/15/2018  . 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 Depressive disorder 11/12/2018  . 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 History of colonic polyps 11/12/2018  . History of nephrolithiasis 11/12/2018  . History of smoking 11/12/2018  . 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 Medication intolerance 11/12/2018  . Old myocardial infarction 11/12/2018  . Essential hypertension 11/12/2018  . Mixed hyperlipidemia 11/12/2018  . Pre-operative cardiovascular examination 11/12/2018  . CAD (coronary artery disease) 11/12/2018  . Bilateral leg edema 05/04/2017  . Primary osteoarthritis of both knees 03/05/2017  . Type II diabetes mellitus with manifestations ([place]) 08/18/2016  . CAP (community acquired pneumonia) 08/17/2016  . Right carotid bruit 08/05/2014  . HYPERLIPIDEMIA-MIXED 06/15/2009  . HYPERTENSION, BENIGN 06/15/2009  . CAD, NATIVE VESSEL 06/15/2009  . Gout 10/14/2007    Orientation RESPIRATION BLADDER Height & Weight     Self, Time, Situation, Place  Normal Continent Weight: 217 lb (98.4  kg) Height:  6\' 1"  (185.4 cm)  BEHAVIORAL SYMPTOMS/MOOD NEUROLOGICAL BOWEL NUTRITION STATUS      Continent Diet(Carb modified)  AMBULATORY STATUS COMMUNICATION OF NEEDS Skin   Limited Assist Verbally Surgical wounds(R knee incision)                       Personal Care Assistance Level of Assistance  Bathing, Feeding, Dressing Bathing Assistance: Limited assistance Feeding assistance: Independent Dressing Assistance: Limited assistance     Functional Limitations Info  Sight, Hearing, Speech Sight Info: Adequate Hearing Info: Adequate Speech Info: Adequate    SPECIAL CARE FACTORS FREQUENCY  PT (By licensed PT), OT (By licensed OT)     PT Frequency: 7x/week OT Frequency: 7x/week            Contractures Contractures Info: Not present    Additional Factors Info  Code Status, Allergies, Psychotropic Code Status Info: Full Allergies Info: ALLOPURINOL, ARIPIPRAZOLE, BUPROPION, BUSPIRONE, DESVENLAFAXINE, DULOXETINE, ESCITALOPRAM, FISH OIL, FLUTICASONE FUROATE, HYDROXYZINE HCL, NORTRIPTYLINE, PAROXETINE HCL, RAMIPRIL, SERTRALINE, VENLAFAXINE, VILAZODONE  Psychotropic Info: Zoloft         Current Medications (11/17/2018):  This is the current hospital active medication list Current Facility-Administered Medications  Medication Dose Route Frequency Provider Last Rate Last Dose  . 0.9 %  sodium chloride infusion   Intravenous Continuous Mcarthur Rossetti, MD   Stopped at 11/16/18 0930  . acetaminophen (TYLENOL) tablet 325-650 mg  325-650 mg Oral Q6H PRN Mcarthur Rossetti, MD   650 mg at 11/16/18 1815  . ALPRAZolam Duanne Moron) tablet 0.25 mg  0.25 mg Oral QHS PRN Mcarthur Rossetti, MD      . alum & mag hydroxide-simeth (MAALOX/MYLANTA) 200-200-20 MG/5ML suspension  30 mL  30 mL Oral Q4H PRN Mcarthur Rossetti, MD      . aspirin EC tablet 81 mg  81 mg Oral QHS Mcarthur Rossetti, MD   81 mg at 11/16/18 2136  . clopidogrel (PLAVIX) tablet 75 mg  75 mg  Oral Daily Mcarthur Rossetti, MD   75 mg at 11/17/18 1048  . colchicine tablet 0.6 mg  0.6 mg Oral BID Mcarthur Rossetti, MD   0.6 mg at 11/16/18 2136  . diphenhydrAMINE (BENADRYL) 12.5 MG/5ML elixir 12.5-25 mg  12.5-25 mg Oral Q4H PRN Mcarthur Rossetti, MD      . docusate sodium (COLACE) capsule 100 mg  100 mg Oral BID Mcarthur Rossetti, MD   100 mg at 11/17/18 1048  . glipiZIDE (GLUCOTROL XL) 24 hr tablet 10 mg  10 mg Oral QHS Mcarthur Rossetti, MD   10 mg at 11/16/18 2136  . HYDROmorphone (DILAUDID) injection 0.5-1 mg  0.5-1 mg Intravenous Q2H PRN Mcarthur Rossetti, MD   1 mg at 11/17/18 1321  . insulin aspart (novoLOG) injection 0-15 Units  0-15 Units Subcutaneous TID WC Mcarthur Rossetti, MD   2 Units at 11/17/18 1318  . insulin aspart (novoLOG) injection 0-5 Units  0-5 Units Subcutaneous QHS Mcarthur Rossetti, MD   2 Units at 11/15/18 2332  . insulin aspart (novoLOG) injection 4 Units  4 Units Subcutaneous TID WC Mcarthur Rossetti, MD   4 Units at 11/17/18 1317  . irbesartan (AVAPRO) tablet 300 mg  300 mg Oral Daily Mcarthur Rossetti, MD   300 mg at 11/17/18 1047  . menthol-cetylpyridinium (CEPACOL) lozenge 3 mg  1 lozenge Oral PRN Mcarthur Rossetti, MD       Or  . phenol (CHLORASEPTIC) mouth spray 1 spray  1 spray Mouth/Throat PRN Mcarthur Rossetti, MD      . methocarbamol (ROBAXIN) tablet 500 mg  500 mg Oral Q6H PRN Mcarthur Rossetti, MD   500 mg at 11/17/18 1047   Or  . methocarbamol (ROBAXIN) 500 mg in dextrose 5 % 50 mL IVPB  500 mg Intravenous Q6H PRN Mcarthur Rossetti, MD 100 mL/hr at 11/15/18 1504 500 mg at 11/15/18 1504  . metoCLOPramide (REGLAN) tablet 5-10 mg  5-10 mg Oral Q8H PRN Mcarthur Rossetti, MD       Or  . metoCLOPramide (REGLAN) injection 5-10 mg  5-10 mg Intravenous Q8H PRN Mcarthur Rossetti, MD      . metoprolol succinate (TOPROL-XL) 24 hr tablet 12.5 mg  12.5 mg Oral Daily  Mcarthur Rossetti, MD   12.5 mg at 11/17/18 1047  . ondansetron (ZOFRAN) tablet 4 mg  4 mg Oral Q6H PRN Mcarthur Rossetti, MD       Or  . ondansetron [facility]) injection 4 mg  4 mg Intravenous Q6H PRN Mcarthur Rossetti, MD      . oxyCODONE (Oxy IR/ROXICODONE) immediate release tablet 10-15 mg  10-15 mg Oral Q3H PRN Mcarthur Rossetti, MD   15 mg at 11/17/18 1047  . oxyCODONE (Oxy IR/ROXICODONE) immediate release tablet 5-10 mg  5-10 mg Oral Q3H PRN Mcarthur Rossetti, MD   10 mg at 11/15/18 2256  . pantoprazole (PROTONIX) EC tablet 40 mg  40 mg Oral Daily Mcarthur Rossetti, MD   40 mg at 11/17/18 1048  . polyethylene glycol (MIRALAX / GLYCOLAX) packet 17 g  17 g Oral Daily PRN Mcarthur Rossetti, MD      .  rosuvastatin (CRESTOR) tablet 20 mg  20 mg Oral QHS Mcarthur Rossetti, MD   20 mg at 11/16/18 2136  . sertraline (ZOLOFT) tablet 12.5 mg  12.5 mg Oral QHS Mcarthur Rossetti, MD   12.5 mg at 11/16/18 2136     Discharge Medications: Please see discharge summary for a list of discharge medications.  Relevant Imaging Results:  Relevant Lab Results:   Additional Information SSN: [id number]  Pricilla Holm, [place]

## 2018-11-17 NOTE — Clinical Social Work Note (Signed)
Clinical Social Work Assessment  Patient Details  Name: [name] [name] MRN: [id number] Date of Birth: November 18, 1951  Date of referral:  11/16/18               Reason for consult:  Facility Placement                Permission sought to share information with:  Facility Art therapist granted to share information::  Yes, Verbal Permission Granted  Name::     Will Mabe  Agency::  SNF  Relationship::  Son  Contact Information:  [phone]  Housing/Transportation Living arrangements for the past 2 months:  Single Family Home Source of Information:  Patient Patient Interpreter Needed:  None Criminal Activity/Legal Involvement Pertinent to Current Situation/Hospitalization:  No - Comment as needed Significant Relationships:  Adult Children Lives with:  Self Do you feel safe going back to the place where you live?  No Need for family participation in patient care:  No (Coment)  Care giving concerns:  Patient post R TKA. Patient lives alone and does not have family support nearby who can provide care. PT recommending short term therapy at SNF to regain strength before returning home.    Social Worker assessment / plan:  CSW met with patient at bedside to discuss discharge plan and SNF process. Patient reports he currently lives alone and does not have family nearby who can provide support. Patient has not been to SNF level rehab before but is agreeable at this time. Patient reported high levels of pain during conversation and had a difficult time focusing. He stated he had a strenuous PT session this morning and was worn out.   Patient agreeable to CSW making referrals to area SNFs. He has no preferences.  CSW explained referral process. [facility] insurance Josem Kaufmann will be needed before d/c.   CSW will complete FL2 and send out referrals.     Employment status:    Nurse, adult PT Recommendations:  [facility] /  Referral to community resources:  [place]  Patient/Family's Response to care:  Patient reported high levels of pain during conversation and wanted to rest. He was appreciative of CSW explanation of SNF process.   Patient/Family's Understanding of and Emotional Response to Diagnosis, Current Treatment, and Prognosis:  Patient understands CSW's role and referral process. He knows CSW will f/u with bed offers and [facility] insurance auth process.   Emotional Assessment Appearance:  Appears older than stated age Attitude/Demeanor/Rapport:    Affect (typically observed):  Accepting, Adaptable, Appropriate Orientation:  Oriented to Self, Oriented to  Time, Oriented to Place, Oriented to Situation Alcohol / Substance use:  Not Applicable Psych involvement (Current and /or in the community):  No (Comment)  Discharge Needs  Concerns to be addressed:  Care Coordination Readmission within the last 30 days:  No Current discharge risk:  Physical Impairment Barriers to Discharge:  Continued Medical Work up, [employer], [place] 11/17/2018, 2:01 PM

## 2018-11-17 NOTE — Progress Notes (Signed)
   Subjective: 2 Days Post-Op Procedure(s) (LRB): RIGHT TOTAL KNEE ARTHROPLASTY (Right) Patient reports pain as moderate and severe.    Objective: Vital signs in last 24 hours: Temp:  [97.8 F (36.6 C)-98.5 F (36.9 C)] 97.8 F (36.6 C) (12/29 0541) Pulse Rate:  [71-80] 71 (12/29 0541) Resp:  [16-17] 17 (12/29 0541) BP: (103-123)/(66-85) 123/75 (12/29 0541) SpO2:  [94 %-99 %] 96 % (12/29 0541)  Intake/Output from previous day: 12/28 0701 - 12/29 0700 In: 770 [P.O.:720; I.V.:50] Out: 300 [Urine:300] Intake/Output this shift: No intake/output data recorded.  Recent Labs    11/16/18 0349  HGB 12.6*   Recent Labs    11/16/18 0349  WBC 9.0  RBC 4.56  HCT 41.3  PLT 144*   Recent Labs    11/16/18 0349  NA 139  K 4.4  CL 103  CO2 24  BUN 24*  CREATININE 1.40*  GLUCOSE 280*  CALCIUM 8.7*   No results for input(s): LABPT, INR in the last 72 hours.  Neurologically intact No results found.  Assessment/Plan: 2 Days Post-Op Procedure(s) (LRB): RIGHT TOTAL KNEE ARTHROPLASTY (Right) Plan: lives alone, plan is SNF  [name] [name] 11/17/2018, 10:22 AM

## 2018-11-17 NOTE — Progress Notes (Signed)
Physical Therapy Treatment Patient Details Name: [name] [name] MRN: [id number] DOB: April 24, 1951 Today's Date: 11/17/2018    History of Present Illness s/p R TKA    PT Comments    Very slow progress d/t pain, pt reports pain as 10/10, had IV meds prior to PT and  More meds during PT session;  Pt with extremely slow, guarded gait; will attempt to come back later and review exercises if schedule allows and pt agreeable, he states at this time pain will not allow for him to even perform quad sets  Follow Up Recommendations  SNF     Equipment Recommendations  None recommended by PT    Recommendations for Other Services       Precautions / Restrictions Precautions Precautions: Knee Required Braces or Orthoses: Knee Immobilizer - Right Knee Immobilizer - Right: Discontinue once straight leg raise with < 10 degree lag Restrictions Weight Bearing Restrictions: No Other Position/Activity Restrictions: WBAT    Mobility  Bed Mobility Overal bed mobility: Needs Assistance Bed Mobility: Supine to Sit     Supine to sit: Min guard     General bed mobility comments: min/guard RLE, cues to self assist RLE using LLE, incr time  Transfers Overall transfer level: Needs assistance Equipment used: Rolling walker (2 wheeled) Transfers: Sit to/from Stand Sit to Stand: Min guard;Min assist         General transfer comment: assist for anterior-superior wt translation  Ambulation/Gait Ambulation/Gait assistance: Min guard Gait Distance (Feet): 45 Feet Assistive device: Rolling walker (2 wheeled) Gait Pattern/deviations: Step-to pattern;Antalgic Gait velocity: .05 (extremely slow gait speed) Gait velocity interpretation: <1.31 ft/sec, indicative of household ambulator General Gait Details: cues for sequence, posture and RW position   Stairs             Wheelchair Mobility    Modified Rankin (Stroke Patients Only)       Balance                                             Cognition Arousal/Alertness: Awake/alert Behavior During Therapy: WFL for tasks assessed/performed Overall Cognitive Status: Within Functional Limits for tasks assessed                                        Exercises Total Joint Exercises Ankle Circles/Pumps: Limitations Quad Sets Limitations: refused d/t pain    General Comments        Pertinent Vitals/Pain Pain Assessment: 0-10 Pain Score: 10-Worst pain ever Pain Location: right knee Pain Descriptors / Indicators: Aching;Burning;Constant;Discomfort;Dull;Grimacing;Guarding Pain Intervention(s): Limited activity within patient's tolerance;Monitored during session;Repositioned;Ice applied;RN gave pain meds during session;Patient requesting pain meds-RN notified;Premedicated before session(IV meds prior to PT)    Home Living                      Prior Function            PT Goals (current goals can now be found in the care plan section) Acute Rehab PT Goals Patient Stated Goal: home PT Goal Formulation: With patient Time For Goal Achievement: 11/23/18 Potential to Achieve Goals: Good Progress towards PT goals: Progressing toward goals(slowly -ltd by pain)    Frequency    7X/week      PT Plan Current plan remains appropriate  Co-evaluation              AM-PAC PT "6 Clicks" Mobility   Outcome Measure  Help needed turning from your back to your side while in a flat bed without using bedrails?: A Little Help needed moving from lying on your back to sitting on the side of a flat bed without using bedrails?: A Little Help needed moving to and from a bed to a chair (including a wheelchair)?: A Little Help needed standing up from a chair using your arms (e.g., wheelchair or bedside chair)?: A Little Help needed to walk in hospital room?: A Little Help needed climbing 3-5 steps with a railing? : A Lot 6 Click Score: 17    End of Session Equipment  Utilized During Treatment: Gait belt Activity Tolerance: Patient limited by pain Patient left: in chair;with call bell/phone within reach;with family/visitor present   PT Visit Diagnosis: History of falling (Z91.81);Difficulty in walking, not elsewhere classified (R26.2)     Time: 9458-5929 PT Time Calculation (min) (ACUTE ONLY): 27 min  Charges:  $Gait Training: 23-37 mins                     Kenyon Ana, PT  Pager: [phone] Acute Rehab Dept [facility]): [phone]   11/17/2018    [facility] 11/17/2018, 3:23 PM

## 2018-11-17 NOTE — Progress Notes (Signed)
CM referral for [facility]. PT/OT recommended SNF rehab. CSW aware of SNF rehab. Will follow up to arrange SNF at time of dc. Jonnie Finner RN CCM Case Mgmt phone [phone]

## 2018-11-18 ENCOUNTER — Encounter (HOSPITAL_COMMUNITY): Payer: Self-pay | Admitting: Orthopaedic Surgery

## 2018-11-18 LAB — GLUCOSE, CAPILLARY
Glucose-Capillary: 84 mg/dL (ref 70–99)
Glucose-Capillary: 177 mg/dL — ABNORMAL HIGH (ref 70–99)
Glucose-Capillary: 170 mg/dL — ABNORMAL HIGH (ref 70–99)
Glucose-Capillary: 157 mg/dL — ABNORMAL HIGH (ref 70–99)

## 2018-11-18 MED ORDER — METHOCARBAMOL 500 MG PO TABS: 500.0000 mg | ORAL_TABLET | Freq: Four times a day (QID) | ORAL | 0 refills | Status: DC | PRN

## 2018-11-18 MED ORDER — OXYCODONE HCL 5 MG PO TABS: 5.0000 mg | ORAL_TABLET | ORAL | 0 refills | Status: DC | PRN

## 2018-11-18 NOTE — Progress Notes (Signed)
Physical Therapy Treatment Patient Details Name: [name] [name] MRN: [id number] DOB: [birth year] Today's Date: 11/18/2018    History of Present Illness s/p R TKA    PT Comments    POD # 3 am session Pt unable to perform SLR so applied KI and instructed on proper use and application.  Assisted OOB required increased time and pt c/o mod dizziness.  Vitals taken all WNL.  Assisted with amb in hallway.  VERY unsteady gait with LOB x 3 therapist recovered.  Pt present with slow, delayed corrective reaction and impaired safety cognition (pain meds?).  Pt required repeat functional VC's for proper walker use and safety with turns.  Max VC's to extend R LE prior to sit and support R LE to decrease pain.  Positioned in recliner and applied ICE.  Pt unable to tolerate TE's due to c/o fatigue and pain level.   Pt progressing slowly and will need ST Rehab.   Follow Up Recommendations  SNF     Equipment Recommendations  None recommended by PT    Recommendations for Other Services       Precautions / Restrictions Precautions Precautions: Knee Precaution Comments: instructed on KI use and proper application Restrictions Weight Bearing Restrictions: No Other Position/Activity Restrictions: WBAT    Mobility  Bed Mobility Overal bed mobility: Needs Assistance Bed Mobility: Supine to Sit     Supine to sit: Min assist     General bed mobility comments: assist with R LE and increased time   Transfers Overall transfer level: Needs assistance Equipment used: Rolling walker (2 wheeled) Transfers: Sit to/from Stand Sit to Stand: Min assist;Mod assist         General transfer comment: 25% VC's on proper hand placement and safety with turns.  Initial posterior LOB.    Ambulation/Gait Ambulation/Gait assistance: Min assist;Mod assist Gait Distance (Feet): 32 Feet   Gait Pattern/deviations: Step-to pattern;Antalgic Gait velocity: decreased x 3   General Gait Details: cues for  sequence, posture and RW position.  VERY unsteady gait and present with impaired cognition (meds?)    Stairs             Wheelchair Mobility    Modified Rankin (Stroke Patients Only)       Balance                                            Cognition Arousal/Alertness: Awake/alert Behavior During Therapy: WFL for tasks assessed/performed Overall Cognitive Status: Within Functional Limits for tasks assessed                                        Exercises      General Comments        Pertinent Vitals/Pain Pain Assessment: 0-10 Pain Score: 8  Pain Location: right knee Pain Descriptors / Indicators: Aching;Burning;Constant;Discomfort;Dull;Grimacing;Guarding Pain Intervention(s): Monitored during session;Repositioned;Patient requesting pain meds-RN notified;Ice applied    Home Living                      Prior Function            PT Goals (current goals can now be found in the care plan section) Progress towards PT goals: Progressing toward goals    Frequency    7X/week  PT Plan Current plan remains appropriate    Co-evaluation              AM-PAC PT "6 Clicks" Mobility   Outcome Measure  Help needed turning from your back to your side while in a flat bed without using bedrails?: A Little Help needed moving from lying on your back to sitting on the side of a flat bed without using bedrails?: A Little Help needed moving to and from a bed to a chair (including a wheelchair)?: A Little Help needed standing up from a chair using your arms (e.g., wheelchair or bedside chair)?: A Little Help needed to walk in hospital room?: A Little Help needed climbing 3-5 steps with a railing? : A Lot 6 Click Score: 17    End of Session Equipment Utilized During Treatment: Gait belt Activity Tolerance: Patient limited by pain Patient left: in chair;with call bell/phone within reach;with chair alarm set Nurse  Communication: Mobility status PT Visit Diagnosis: History of falling (Z91.81);Difficulty in walking, not elsewhere classified (R26.2)     Time: 2725-3664 PT Time Calculation (min) (ACUTE ONLY): 44 min  Charges:  $Gait Training: 8-22 mins $Therapeutic Activity: 23-37 mins                     Rica Koyanagi  PTA Acute  Rehabilitation Services Pager      [phone] Office      [phone]

## 2018-11-18 NOTE — Discharge Summary (Signed)
Patient ID: [name] [name] MRN: [id number] DOB/AGE: 1951-11-06 67 y.o.  Admit date: 11/15/2018 Discharge date: 11/18/2018  Admission Diagnoses:  Principal Problem:   Unilateral primary osteoarthritis, right knee Active Problems:   Status post total right knee replacement   Discharge Diagnoses:  Same  Past Medical History:  Diagnosis Date  . 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 DM (diabetes mellitus) ([place])   . HTN (hypertension)   . Hyperlipidemia     Surgeries: Procedure(s): RIGHT TOTAL KNEE ARTHROPLASTY on 11/15/2018   Consultants:   Discharged Condition: Improved  Hospital Course: [name] [name] is an 67 y.o. male who was admitted 11/15/2018 for operative treatment ofUnilateral primary osteoarthritis, right knee. Patient has severe unremitting pain that affects sleep, daily activities, and work/hobbies. After pre-op clearance the patient was taken to the operating room on 11/15/2018 and underwent  Procedure(s): RIGHT TOTAL KNEE ARTHROPLASTY.    Patient was given perioperative antibiotics:  Anti-infectives (From admission, onward)   Start     Dose/Rate Route Frequency Ordered Stop   11/15/18 1830  ceFAZolin (ANCEF) IVPB 1 g/50 mL premix     1 g 100 mL/hr over 30 Minutes Intravenous Every 6 hours 11/15/18 1803 11/16/18 0004   11/15/18 0830  ceFAZolin (ANCEF) IVPB 2g/100 mL premix     2 g 200 mL/hr over 30 Minutes Intravenous On call to O.R. 11/15/18 6294 11/15/18 1301       Patient was given sequential compression devices, early ambulation, and chemoprophylaxis to prevent DVT.  Patient benefited maximally from hospital stay and there were no complications.    Recent vital signs:  Patient Vitals for the past 24 hrs:  BP Temp Temp src Pulse Resp SpO2  11/18/18 0619 (!) 145/86 97.8 F (36.6 C) Oral 75 18 100 %  11/17/18 2159 125/86 98.4 F (36.9 C) Oral 80 20 99  %  11/17/18 1457 (!) 142/83 98.4 F (36.9 C) - 84 16 99 %     Recent laboratory studies:  Recent Labs    11/16/18 0349  WBC 9.0  HGB 12.6*  HCT 41.3  PLT 144*  NA 139  K 4.4  CL 103  CO2 24  BUN 24*  CREATININE 1.40*  GLUCOSE 280*  CALCIUM 8.7*     Discharge Medications:   Allergies as of 11/18/2018      Reactions   Allopurinol Other (See Comments)   Felt terrible   Aripiprazole Other (See Comments)   Felt terrible   Bupropion Other (See Comments)   Felt bad   Buspirone Other (See Comments)   Felt bad   Desvenlafaxine Other (See Comments)   Made him hyper   Duloxetine Other (See Comments)   Knocked him out   Escitalopram Other (See Comments)   Felt like crap   Fish Oil    Fluticasone Furoate Other (See Comments)   Unknown    Hydroxyzine Hcl Other (See Comments)   Knocked him out   Nortriptyline Other (See Comments)   Ineffective   Paroxetine Hcl Other (See Comments)   unknown   Ramipril Cough   Sertraline Other (See Comments)   "felt plugged in"   Venlafaxine Other (See Comments)   Felt bad   Vilazodone Other (See Comments)   "felt weird"      Medication List    STOP taking these medications   HYDROcodone-acetaminophen 5-325 MG tablet  Commonly known as:  NORCO/VICODIN     TAKE these medications   ALPRAZolam 0.25 MG tablet Commonly known as:  XANAX Take 0.25 mg by mouth at bedtime as needed for anxiety.   aspirin 81 MG tablet Take 81 mg by mouth at bedtime.   CIALIS 20 MG tablet Generic drug:  tadalafil Take 20 mg by mouth daily as needed for erectile dysfunction.   clopidogrel 75 MG tablet Commonly known as:  PLAVIX Take 1 tablet (75 mg total) by mouth daily.   COLCRYS 0.6 MG tablet Generic drug:  colchicine Take 1 tablet (0.6 mg total) by mouth 2 (two) times daily.   cyanocobalamin 1000 MCG/ML injection Commonly known as:  (VITAMIN B-12) Inject 1,000 mcg into the muscle every 14 (fourteen) days.   FREESTYLE INSULINX TEST test  strip Generic drug:  glucose blood   freestyle lancets   glipiZIDE 10 MG 24 hr tablet Commonly known as:  GLUCOTROL XL Take 10 mg by mouth at bedtime.   insulin glargine 100 UNIT/ML injection Commonly known as:  LANTUS Inject 0.18 mLs (18 Units total) into the skin at bedtime. What changed:  how much to take   levalbuterol 45 MCG/ACT inhaler Commonly known as:  XOPENEX HFA Inhale 1 puff into the lungs every 4 (four) hours as needed for wheezing.   methocarbamol 500 MG tablet Commonly known as:  ROBAXIN Take 1 tablet (500 mg total) by mouth every 6 (six) hours as needed for muscle spasms.   metoprolol succinate 25 MG 24 hr tablet Commonly known as:  TOPROL-XL Take 12.5 mg by mouth daily.   NOVOFINE 30G X 8 MM Misc Generic drug:  Insulin Pen Needle   oxyCODONE 5 MG immediate release tablet Commonly known as:  Oxy IR/ROXICODONE Take 1-2 tablets (5-10 mg total) by mouth every 4 (four) hours as needed for moderate pain (pain score 4-6).   rosuvastatin 20 MG tablet Commonly known as:  CRESTOR Take 20 mg by mouth at bedtime.   sertraline 25 MG tablet Commonly known as:  ZOLOFT Take 12.5 mg by mouth at bedtime.   telmisartan 80 MG tablet Commonly known as:  MICARDIS Take 80 mg by mouth daily.            Durable Medical Equipment  (From admission, onward)         Start     Ordered   11/15/18 1804  DME Walker rolling  Once    Question:  Patient needs a walker to treat with the following condition  Answer:  Status post total right knee replacement   11/15/18 1803   11/15/18 1804  DME 3 n 1  Once     11/15/18 1803          Diagnostic Studies: Ct Head Wo Contrast  Result Date: 11/04/2018 CLINICAL DATA:  [place] today. Taking Plavix. EXAM: CT HEAD WITHOUT CONTRAST TECHNIQUE: Contiguous axial images were obtained from the base of the skull through the vertex without intravenous contrast. COMPARISON:  None. FINDINGS: Brain: Mild-to-moderate diffuse enlargement of the  ventricles and subarachnoid spaces. Minimal patchy white matter low density in both cerebral hemispheres. No intracranial hemorrhage, mass lesion or CT evidence of acute infarction. Vascular: No hyperdense vessel or unexpected calcification. Skull: Normal. Negative for fracture or focal lesion. Sinuses/Orbits: No acute finding. Other: None. IMPRESSION: 1. No acute abnormality. 2. Mild to moderate diffuse cerebral and cerebellar atrophy. 3. Minimal chronic small vessel white matter ischemic changes in both cerebral hemispheres. Electronically Signed   By:  Claudie Revering M.D.   On: 11/04/2018 18:53   Dg Shoulder Left  Result Date: 11/04/2018 CLINICAL DATA:  Left shoulder pain following a fall today. EXAM: LEFT SHOULDER - 2+ VIEW COMPARISON:  None. FINDINGS: Mild to moderate acromioclavicular spur formation. No fracture or dislocation seen. IMPRESSION: 1. No fracture or dislocation. 2. Mild to moderate AC joint degenerative changes. Electronically Signed   By: Claudie Revering M.D.   On: 11/04/2018 18:50   Dg Knee Complete 4 Views Right  Result Date: 11/04/2018 CLINICAL DATA:  Right knee pain following a fall today. Scheduled for a right knee replacement later this month. EXAM: RIGHT KNEE - COMPLETE 4+ VIEW COMPARISON:  08/22/2018. FINDINGS: Again demonstrated is marked medial joint space narrowing and associated spur formation. Lateral and patellofemoral spur formation is again demonstrated. Lateral meniscal calcifications are again demonstrated as well as periarticular calcifications suggest synovial chondromatosis. No fracture, dislocation or effusion. IMPRESSION: 1. No fracture or effusion. 2. 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 Electronically Signed   By: Claudie Revering M.D.   On: 11/04/2018 18:50   Dg Knee Right Port  Result Date: 11/15/2018 CLINICAL DATA:  Status post right total knee replacement. EXAM: PORTABLE RIGHT KNEE - 1-2 VIEW COMPARISON:   Radiographs of November 04, 2018. FINDINGS: The femoral and tibial components appear to be well situated. Expected postoperative changes are noted in the soft tissues anteriorly. No fracture or dislocation is noted. IMPRESSION: Status post right total knee arthroplasty. Electronically Signed   By: Marijo Conception, M.D.   On: 11/15/2018 15:31    Disposition: Discharge disposition: 03-Skilled [place]    Mcarthur Rossetti, MD Follow up in 2 week(s).   Specialty:  Orthopedic Surgery Contact information: [place] [address] [phone]            Signed: Mcarthur Rossetti 11/18/2018, 7:23 AM

## 2018-11-18 NOTE — Progress Notes (Addendum)
Patient has been accepted to [facility] for short rehab. SNF started [facility] authorization process.   CSW notified the physician office, the patient will need a 30 day note sign for level II PASRR screening. CSW faxed it to: [phone]  [facility] is out of network with the patient current insurance policy. CSW updated the patient. Patient discussing new SNF option with son at this time.  30 day note faxed to [facility].   Patient agreeable to [facility]. Facility initiated insurance process. CSW will inform the patient and medical team when Josem Kaufmann is received.  PASRR number received.    Kathrin Greathouse, Marlinda Mike, MSW Clinical Social Worker  [phone] 11/18/2018  11:05 AM

## 2018-11-18 NOTE — Discharge Instructions (Signed)

## 2018-11-18 NOTE — Progress Notes (Signed)
Physical Therapy Treatment Patient Details Name: [name] [name] MRN: [id number] DOB: Jun 03, 1951 Today's Date: 11/18/2018    History of Present Illness s/p R TKA    PT Comments    POD # 3 pm session Attempted to amb and assisted to standing when pt c/o increased dizziness.  Vitals all WNL.  Waited for + 2 assist and stood again.  Pt unsteady.  Assisted back to bed.   Follow Up Recommendations  SNF     Equipment Recommendations  None recommended by PT    Recommendations for Other Services       Precautions / Restrictions Precautions Precautions: Knee Precaution Comments: instructed on KI use and proper application Restrictions Weight Bearing Restrictions: No Other Position/Activity Restrictions: WBAT    Mobility  Bed Mobility Overal bed mobility: Needs Assistance Bed Mobility: Supine to Sit     Supine to sit: Min assist     General bed mobility comments: assist with R LE and increased time   Transfers Overall transfer level: Needs assistance Equipment used: Rolling walker (2 wheeled) Transfers: Sit to/from Stand Sit to Stand: Min assist;Mod assist         General transfer comment: 25% VC's on proper hand placement and safety with turns.  Initial posterior LOB.    Ambulation/Gait   Stairs             Wheelchair Mobility    Modified Rankin (Stroke Patients Only)       Balance                                            Cognition Arousal/Alertness: Awake/alert Behavior During Therapy: WFL for tasks assessed/performed Overall Cognitive Status: Within Functional Limits for tasks assessed                                        Exercises      General Comments        Pertinent Vitals/Pain Pain Assessment: 0-10 Pain Score: 8  Pain Location: right knee Pain Descriptors / Indicators: Aching;Burning;Constant;Discomfort;Dull;Grimacing;Guarding Pain Intervention(s): Monitored during  session;Repositioned;Patient requesting pain meds-RN notified;Ice applied    Home Living                      Prior Function            PT Goals (current goals can now be found in the care plan section) Progress towards PT goals: Progressing toward goals    Frequency    7X/week      PT Plan Current plan remains appropriate    Co-evaluation              AM-PAC PT "6 Clicks" Mobility   Outcome Measure  Help needed turning from your back to your side while in a flat bed without using bedrails?: A Little Help needed moving from lying on your back to sitting on the side of a flat bed without using bedrails?: A Little Help needed moving to and from a bed to a chair (including a wheelchair)?: A Little Help needed standing up from a chair using your arms (e.g., wheelchair or bedside chair)?: A Little Help needed to walk in hospital room?: A Little Help needed climbing 3-5 steps with a railing? : A Lot 6 Click Score:  17    End of Session Equipment Utilized During Treatment: Gait belt Activity Tolerance: Patient limited by pain Patient left: in chair;with call bell/phone within reach;with chair alarm set Nurse Communication: Mobility status PT Visit Diagnosis: History of falling (Z91.81);Difficulty in walking, not elsewhere classified (R26.2)     Time: 3559-7416 PT Time Calculation (min) (ACUTE ONLY): 15 min  Charges:  $Gait Training: 8-22 mins $Therapeutic Activity: 8-22 mins                     Rica Koyanagi  PTA Acute  Rehabilitation Services Pager      [phone] Office      [phone]

## 2018-11-18 NOTE — Care Management Important Message (Signed)
Important Message  Patient Details  Name: CALE BETHARD MRN: [id number] Date of Birth: [birth year]   Medicare Important Message Given:  Yes    Kerin Salen 11/18/2018, 12:19 [place] Message  Patient Details  Name: WILHELM GANAWAY MRN: [id number] Date of Birth: Oct 02, 1951   Medicare Important Message Given:  Yes    Kerin Salen 11/18/2018, 12:19 PM

## 2018-11-18 NOTE — Progress Notes (Signed)
Subjective: 3 Days Post-Op Procedure(s) (LRB): RIGHT TOTAL KNEE ARTHROPLASTY (Right) Patient reports pain as moderate.  Has been working well with therapy.  Short-term skilled nursing has been recommended.  Objective: Vital signs in last 24 hours: Temp:  [97.8 F (36.6 C)-98.4 F (36.9 C)] 97.8 F (36.6 C) (12/30 0619) Pulse Rate:  [75-84] 75 (12/30 0619) Resp:  [16-20] 18 (12/30 0619) BP: (125-145)/(83-86) 145/86 (12/30 0619) SpO2:  [99 %-100 %] 100 % (12/30 0619)  Intake/Output from previous day: 12/29 0701 - 12/30 0700 In: 1020 [P.O.:1020] Out: 2000 [Urine:2000] Intake/Output this shift: No intake/output data recorded.  Recent Labs    11/16/18 0349  HGB 12.6*   Recent Labs    11/16/18 0349  WBC 9.0  RBC 4.56  HCT 41.3  PLT 144*   Recent Labs    11/16/18 0349  NA 139  K 4.4  CL 103  CO2 24  BUN 24*  CREATININE 1.40*  GLUCOSE 280*  CALCIUM 8.7*   No results for input(s): LABPT, INR in the last 72 hours.  Sensation intact distally Intact pulses distally Dorsiflexion/Plantar flexion intact Incision: scant drainage No cellulitis present Compartment soft  Assessment/Plan: 3 Days Post-Op Procedure(s) (LRB): RIGHT TOTAL KNEE ARTHROPLASTY (Right) Up with therapy Discharge to SNF today.    Mcarthur Rossetti 11/18/2018, 7:20 AM

## 2018-11-19 DIAGNOSIS — E785 Hyperlipidemia, unspecified: Secondary | ICD-10-CM | POA: Diagnosis not present

## 2018-11-19 DIAGNOSIS — I10 Essential (primary) hypertension: Secondary | ICD-10-CM | POA: Diagnosis not present

## 2018-11-19 DIAGNOSIS — Z96651 Presence of right artificial knee joint: Secondary | ICD-10-CM | POA: Diagnosis not present

## 2018-11-19 DIAGNOSIS — K4091 Unilateral inguinal hernia, without obstruction or gangrene, recurrent: Secondary | ICD-10-CM | POA: Diagnosis not present

## 2018-11-19 DIAGNOSIS — M1711 Unilateral primary osteoarthritis, right knee: Secondary | ICD-10-CM | POA: Diagnosis not present

## 2018-11-19 DIAGNOSIS — R262 Difficulty in walking, not elsewhere classified: Secondary | ICD-10-CM | POA: Diagnosis not present

## 2018-11-19 DIAGNOSIS — E1169 Type 2 diabetes mellitus with other specified complication: Secondary | ICD-10-CM | POA: Diagnosis not present

## 2018-11-19 DIAGNOSIS — T8141XA Infection following a procedure, superficial incisional surgical site, initial encounter: Secondary | ICD-10-CM | POA: Diagnosis not present

## 2018-11-19 DIAGNOSIS — I2510 Atherosclerotic heart disease of native coronary artery without angina pectoris: Secondary | ICD-10-CM | POA: Diagnosis not present

## 2018-11-19 DIAGNOSIS — R278 Other lack of coordination: Secondary | ICD-10-CM | POA: Diagnosis not present

## 2018-11-19 DIAGNOSIS — Z471 Aftercare following joint replacement surgery: Secondary | ICD-10-CM | POA: Diagnosis not present

## 2018-11-19 DIAGNOSIS — E119 Type 2 diabetes mellitus without complications: Secondary | ICD-10-CM | POA: Diagnosis not present

## 2018-11-19 DIAGNOSIS — E782 Mixed hyperlipidemia: Secondary | ICD-10-CM | POA: Diagnosis not present

## 2018-11-19 LAB — GLUCOSE, CAPILLARY
Glucose-Capillary: 152 mg/dL — ABNORMAL HIGH (ref 70–99)
Glucose-Capillary: 167 mg/dL — ABNORMAL HIGH (ref 70–99)

## 2018-11-19 NOTE — Progress Notes (Signed)
Patient ID: [name] [name], male   DOB: 01-26-1951, 67 y.o.   MRN: [id number] The patient is awaiting insurance approval for discharge to skilled nursing.  This is the only thing that is keeping him in the hospital.  He can be still discharged today.  An updated discharge summary is not needed.

## 2018-11-19 NOTE — Progress Notes (Signed)
Pt to be transported to [facility] today by son. Report called and given to [facility], RN. Pt stable at this time, ready for transport. Discharge paperwork and prescriptions placed in d/c packet. Instructions given to son to give packet to RN at rehab facility.

## 2018-11-19 NOTE — Plan of Care (Signed)
Pt alert and oriented, in CPM this am. Pain well controlled with PO pain meds, awaiting authorization for SNF. RN will monitor.

## 2018-11-20 DIAGNOSIS — Z96651 Presence of right artificial knee joint: Secondary | ICD-10-CM | POA: Diagnosis not present

## 2018-11-20 DIAGNOSIS — E785 Hyperlipidemia, unspecified: Secondary | ICD-10-CM | POA: Diagnosis not present

## 2018-11-20 DIAGNOSIS — I2510 Atherosclerotic heart disease of native coronary artery without angina pectoris: Secondary | ICD-10-CM | POA: Diagnosis not present

## 2018-11-20 DIAGNOSIS — I10 Essential (primary) hypertension: Secondary | ICD-10-CM | POA: Diagnosis not present

## 2018-11-20 DIAGNOSIS — E119 Type 2 diabetes mellitus without complications: Secondary | ICD-10-CM | POA: Diagnosis not present

## 2018-11-21 ENCOUNTER — Ambulatory Visit (INDEPENDENT_AMBULATORY_CARE_PROVIDER_SITE_OTHER): Payer: Medicare Other | Admitting: Orthopaedic Surgery

## 2018-11-21 DIAGNOSIS — M1711 Unilateral primary osteoarthritis, right knee: Secondary | ICD-10-CM | POA: Diagnosis not present

## 2018-11-21 DIAGNOSIS — I10 Essential (primary) hypertension: Secondary | ICD-10-CM | POA: Diagnosis not present

## 2018-11-21 DIAGNOSIS — I2510 Atherosclerotic heart disease of native coronary artery without angina pectoris: Secondary | ICD-10-CM | POA: Diagnosis not present

## 2018-11-21 DIAGNOSIS — E1169 Type 2 diabetes mellitus with other specified complication: Secondary | ICD-10-CM | POA: Diagnosis not present

## 2018-11-27 DIAGNOSIS — E1169 Type 2 diabetes mellitus with other specified complication: Secondary | ICD-10-CM | POA: Diagnosis not present

## 2018-11-27 DIAGNOSIS — M1711 Unilateral primary osteoarthritis, right knee: Secondary | ICD-10-CM | POA: Diagnosis not present

## 2018-11-27 DIAGNOSIS — I10 Essential (primary) hypertension: Secondary | ICD-10-CM | POA: Diagnosis not present

## 2018-11-28 ENCOUNTER — Telehealth (INDEPENDENT_AMBULATORY_CARE_PROVIDER_SITE_OTHER): Payer: Self-pay | Admitting: Physician Assistant

## 2018-11-28 NOTE — Telephone Encounter (Signed)
They need to leave the incision alone.  We will take them out at his outpatient visit.

## 2018-11-28 NOTE — Telephone Encounter (Signed)
Please advise, or keep staples in until his appointment with [place]?

## 2018-11-28 NOTE — Telephone Encounter (Signed)
[name] [name] from [facility] called wanting to know if the PA at the nursing home can take the stitches out.  Patient has an appointment on Monday, January 13 at 1:30.  CB#[phone].  Thank you.

## 2018-11-28 NOTE — Telephone Encounter (Signed)
Lennette Bihari aware to just keep staples in

## 2018-12-02 ENCOUNTER — Ambulatory Visit (INDEPENDENT_AMBULATORY_CARE_PROVIDER_SITE_OTHER): Payer: Medicare Other | Admitting: Physician Assistant

## 2018-12-02 ENCOUNTER — Encounter (INDEPENDENT_AMBULATORY_CARE_PROVIDER_SITE_OTHER): Payer: Self-pay | Admitting: Physician Assistant

## 2018-12-02 DIAGNOSIS — Z96651 Presence of right artificial knee joint: Secondary | ICD-10-CM

## 2018-12-02 DIAGNOSIS — T8141XA Infection following a procedure, superficial incisional surgical site, initial encounter: Secondary | ICD-10-CM | POA: Diagnosis not present

## 2018-12-02 DIAGNOSIS — M1711 Unilateral primary osteoarthritis, right knee: Secondary | ICD-10-CM | POA: Diagnosis not present

## 2018-12-02 DIAGNOSIS — I10 Essential (primary) hypertension: Secondary | ICD-10-CM | POA: Diagnosis not present

## 2018-12-02 DIAGNOSIS — E1169 Type 2 diabetes mellitus with other specified complication: Secondary | ICD-10-CM | POA: Diagnosis not present

## 2018-12-02 MED ORDER — DOXYCYCLINE HYCLATE 100 MG PO TABS: 100.0000 mg | ORAL_TABLET | Freq: Two times a day (BID) | ORAL | 0 refills | Status: AC

## 2018-12-02 NOTE — Progress Notes (Signed)
Office Visit Note   Patient: [name] [name]           Date of Birth: Jun 22, 1951           MRN: [id number] Visit Date: 12/02/2018              Requested by: Raeanne Gathers, MD [address] [place], [place] [address] PCP: Raeanne Gathers, MD   Assessment & Plan: Visit Diagnoses:  1. Status post total right knee replacement     Plan: We will have him wash the knee and shower with antibacterial soap daily dry completely and then apply a light bandage.  See him back on Thursday of this week for wound check.  He is to follow-up sooner if he has any increased drainage or redness around the incision site.  He was placed on doxycycline 100 mg twice daily for 14 days.  Questions encouraged and answered.  Most likely will remove staples early next week.  Continue to work with therapy for range of motion strengthening gait and balance.  Follow-Up Instructions: Return in about 3 days (around 12/05/2018) for post op.   Orders:  No orders of the defined types were placed in this encounter.  Meds ordered this encounter  Medications  . doxycycline (VIBRA-TABS) 100 MG tablet    Sig: Take 1 tablet (100 mg total) by mouth 2 (two) times daily for 14 days.    Dispense:  28 tablet    Refill:  0      Procedures: No procedures performed   Clinical Data: No additional findings.   Subjective: Chief Complaint  Patient presents with  . Right Knee - Routine Post Op    HPI [name] [name] returns now 2 weeks status post right total knee replacement.  He is overall doing well.  Had no chest pain shortness of breath.  He notes that he has had some drainage from the wound but he is status reinforced.  He has had no fevers.  He is working with physical therapy and is at a skilled facility. Review of Systems See HPI  Objective: Vital Signs: There were no vitals taken for this visit.  Physical Exam  Ortho Exam Right knee he lacks full extension by about 7 degrees flexion to 85 to 90 degrees.   Calf supple nontender.  Staples are well approximate the incision site sutures from hardware removal lateral aspect of the knee healing well.  Distal right knee incision with serosanguineous drainage.  No signs of gross infection.  Slight warmth right knee.  No significant erythema. Specialty Comments:  No specialty comments available.  Imaging: No results found.   PMFS History: Patient Active Problem List   Diagnosis Date Noted  . Unilateral primary osteoarthritis, right knee 11/15/2018  . 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 Depressive disorder 11/12/2018  . 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 History of colonic polyps 11/12/2018  . History of nephrolithiasis 11/12/2018  . History of smoking 11/12/2018  . 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 Medication intolerance 11/12/2018  . Old myocardial infarction 11/12/2018  . Essential hypertension 11/12/2018  . Mixed hyperlipidemia 11/12/2018  . Pre-operative cardiovascular examination 11/12/2018  . CAD (coronary artery disease) 11/12/2018  . Bilateral leg edema 05/04/2017  . Primary osteoarthritis of both knees 03/05/2017  . Type II diabetes mellitus with manifestations ([place]) 08/18/2016  . CAP (community acquired pneumonia) 08/17/2016  . Right carotid bruit 08/05/2014  .  HYPERLIPIDEMIA-MIXED 06/15/2009  . HYPERTENSION, BENIGN 06/15/2009  . CAD, NATIVE VESSEL 06/15/2009  . Gout 10/14/2007   Past Medical History:  Diagnosis Date  . 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 DM (diabetes mellitus) ([place])   . HTN (hypertension)   . Hyperlipidemia     Family History  Problem Relation Age of Onset  . Hypertension Mother   . Cancer Mother   . Heart attack Father   . Hypertension Father   . Diabetes Father   . Hypertension Brother     . Irregular heart beat Brother     Past Surgical History:  Procedure Laterality Date  . CORONARY ANGIOPLASTY WITH STENT PLACEMENT    . LAPAROSCOPIC INGUINAL HERNIA REPAIR    . TOTAL KNEE ARTHROPLASTY Right 11/15/2018   Procedure: RIGHT TOTAL KNEE ARTHROPLASTY;  Surgeon: Mcarthur Rossetti, MD;  Location: [facility];  Service: Orthopedics;  Laterality: Right;   Social History   Occupational History  . Not on file  Tobacco Use  . Smoking status: Former Smoker    Packs/day: 0.50    Years: 10.00    Pack years: 5.00  . Smokeless tobacco: Former Systems developer    Types: Snuff, Sarina Ser    Quit date: 11/20/1968  Substance and Sexual Activity  . Alcohol use: No  . Drug use: No  . Sexual activity: Yes

## 2018-12-05 ENCOUNTER — Ambulatory Visit (INDEPENDENT_AMBULATORY_CARE_PROVIDER_SITE_OTHER): Payer: Medicare Other | Admitting: Physician Assistant

## 2018-12-05 ENCOUNTER — Encounter (INDEPENDENT_AMBULATORY_CARE_PROVIDER_SITE_OTHER): Payer: Self-pay | Admitting: Physician Assistant

## 2018-12-05 DIAGNOSIS — Z96651 Presence of right artificial knee joint: Secondary | ICD-10-CM

## 2018-12-05 NOTE — Progress Notes (Signed)
HPI: Mr. Arndt returns today for follow-up of his right knee.  He states overall that the wound is looking better.  He is ambulating more.  He states his leg does not feel swollen.  He has been taking doxycycline 100 mg twice daily and washing the leg with an antibacterial soap.  Physical exam right knee staples well approximate the wound.  Areas of maceration are resolved.  Decreasing erythema about the knee.  No gross purulence some minimal serosanguineous drainage.  Right knee lacks a few degrees and full extension flexes to 90 degrees.  Impression: Status post right total knee arthroplasty 11/15/2018  Plan: He will continue washing the wound with an antibacterial soap daily.  Staples were removed today Steri-Strips applied.  He will continue his doxycycline.  Continue physical therapy to work on range of motion strengthening the knee.  Follow-up with [place] in 1 week sooner if there is any questions or concerns.

## 2018-12-09 DIAGNOSIS — E1169 Type 2 diabetes mellitus with other specified complication: Secondary | ICD-10-CM | POA: Diagnosis not present

## 2018-12-09 DIAGNOSIS — M1711 Unilateral primary osteoarthritis, right knee: Secondary | ICD-10-CM | POA: Diagnosis not present

## 2018-12-09 DIAGNOSIS — I10 Essential (primary) hypertension: Secondary | ICD-10-CM | POA: Diagnosis not present

## 2018-12-09 DIAGNOSIS — T8141XA Infection following a procedure, superficial incisional surgical site, initial encounter: Secondary | ICD-10-CM | POA: Diagnosis not present

## 2018-12-12 ENCOUNTER — Encounter (INDEPENDENT_AMBULATORY_CARE_PROVIDER_SITE_OTHER): Payer: Self-pay | Admitting: Physician Assistant

## 2018-12-12 ENCOUNTER — Ambulatory Visit (INDEPENDENT_AMBULATORY_CARE_PROVIDER_SITE_OTHER): Payer: Medicare Other | Admitting: Physician Assistant

## 2018-12-12 DIAGNOSIS — Z96651 Presence of right artificial knee joint: Secondary | ICD-10-CM

## 2018-12-12 NOTE — Progress Notes (Signed)
HPI: [name] [name] returns today now 27 days status post right total knee arthroplasty.  He states he is overall doing well.  He is working with physical therapy daily still in skilled facilities did discharge home tomorrow.  He has had no fevers chills.  He remains on the doxycycline 100 mg twice daily.  Physical exam: Right knee surgical incisions healing well.  No dehiscence.  Slight serosanguineous drainage at the distal end of the incision.  No gross signs of infection.  Has full extension flexion to 90 degrees calf supple nontender  Impression: Status post right total knee arthroplasty  Plan: We will keep him on doxycycline for additional 7 days.  We will continue work with physical therapy for range of motion strengthening.  He is to wash the incision daily with antibacterial soap.  Follow-up with Dr. Ninfa [place]en in 2 weeks check his progress lack

## 2018-12-15 DIAGNOSIS — Z471 Aftercare following joint replacement surgery: Secondary | ICD-10-CM | POA: Diagnosis not present

## 2018-12-15 DIAGNOSIS — I10 Essential (primary) hypertension: Secondary | ICD-10-CM | POA: Diagnosis not present

## 2018-12-15 DIAGNOSIS — K4091 Unilateral inguinal hernia, without obstruction or gangrene, recurrent: Secondary | ICD-10-CM | POA: Diagnosis not present

## 2018-12-15 DIAGNOSIS — Z96651 Presence of right artificial knee joint: Secondary | ICD-10-CM | POA: Diagnosis not present

## 2018-12-15 DIAGNOSIS — E119 Type 2 diabetes mellitus without complications: Secondary | ICD-10-CM | POA: Diagnosis not present

## 2018-12-15 DIAGNOSIS — I2510 Atherosclerotic heart disease of native coronary artery without angina pectoris: Secondary | ICD-10-CM | POA: Diagnosis not present

## 2018-12-15 DIAGNOSIS — E782 Mixed hyperlipidemia: Secondary | ICD-10-CM | POA: Diagnosis not present

## 2018-12-15 DIAGNOSIS — Z9181 History of falling: Secondary | ICD-10-CM | POA: Diagnosis not present

## 2018-12-16 ENCOUNTER — Telehealth (INDEPENDENT_AMBULATORY_CARE_PROVIDER_SITE_OTHER): Payer: Self-pay | Admitting: Orthopaedic Surgery

## 2018-12-16 NOTE — Telephone Encounter (Signed)
Titana/[facility]/[phone] called  Needing verbal agreement for PT. Seen patient on 12/15/17.  3x week for 2 weeks 2x week for 1 week.  Please call Titana to advise.[phone]

## 2018-12-16 NOTE — Telephone Encounter (Signed)
Verbal order given  

## 2018-12-17 DIAGNOSIS — E119 Type 2 diabetes mellitus without complications: Secondary | ICD-10-CM | POA: Diagnosis not present

## 2018-12-17 DIAGNOSIS — E782 Mixed hyperlipidemia: Secondary | ICD-10-CM | POA: Diagnosis not present

## 2018-12-17 DIAGNOSIS — Z9181 History of falling: Secondary | ICD-10-CM | POA: Diagnosis not present

## 2018-12-17 DIAGNOSIS — Z471 Aftercare following joint replacement surgery: Secondary | ICD-10-CM | POA: Diagnosis not present

## 2018-12-17 DIAGNOSIS — I10 Essential (primary) hypertension: Secondary | ICD-10-CM | POA: Diagnosis not present

## 2018-12-17 DIAGNOSIS — Z96651 Presence of right artificial knee joint: Secondary | ICD-10-CM | POA: Diagnosis not present

## 2018-12-17 DIAGNOSIS — I2510 Atherosclerotic heart disease of native coronary artery without angina pectoris: Secondary | ICD-10-CM | POA: Diagnosis not present

## 2018-12-17 DIAGNOSIS — K4091 Unilateral inguinal hernia, without obstruction or gangrene, recurrent: Secondary | ICD-10-CM | POA: Diagnosis not present

## 2018-12-19 DIAGNOSIS — Z9181 History of falling: Secondary | ICD-10-CM | POA: Diagnosis not present

## 2018-12-19 DIAGNOSIS — Z471 Aftercare following joint replacement surgery: Secondary | ICD-10-CM | POA: Diagnosis not present

## 2018-12-19 DIAGNOSIS — I10 Essential (primary) hypertension: Secondary | ICD-10-CM | POA: Diagnosis not present

## 2018-12-19 DIAGNOSIS — I2510 Atherosclerotic heart disease of native coronary artery without angina pectoris: Secondary | ICD-10-CM | POA: Diagnosis not present

## 2018-12-19 DIAGNOSIS — Z96651 Presence of right artificial knee joint: Secondary | ICD-10-CM | POA: Diagnosis not present

## 2018-12-19 DIAGNOSIS — E782 Mixed hyperlipidemia: Secondary | ICD-10-CM | POA: Diagnosis not present

## 2018-12-19 DIAGNOSIS — E119 Type 2 diabetes mellitus without complications: Secondary | ICD-10-CM | POA: Diagnosis not present

## 2018-12-19 DIAGNOSIS — K4091 Unilateral inguinal hernia, without obstruction or gangrene, recurrent: Secondary | ICD-10-CM | POA: Diagnosis not present

## 2018-12-20 DIAGNOSIS — Z471 Aftercare following joint replacement surgery: Secondary | ICD-10-CM | POA: Diagnosis not present

## 2018-12-20 DIAGNOSIS — I2510 Atherosclerotic heart disease of native coronary artery without angina pectoris: Secondary | ICD-10-CM | POA: Diagnosis not present

## 2018-12-20 DIAGNOSIS — Z96651 Presence of right artificial knee joint: Secondary | ICD-10-CM | POA: Diagnosis not present

## 2018-12-20 DIAGNOSIS — I10 Essential (primary) hypertension: Secondary | ICD-10-CM | POA: Diagnosis not present

## 2018-12-20 DIAGNOSIS — Z9181 History of falling: Secondary | ICD-10-CM | POA: Diagnosis not present

## 2018-12-20 DIAGNOSIS — E782 Mixed hyperlipidemia: Secondary | ICD-10-CM | POA: Diagnosis not present

## 2018-12-20 DIAGNOSIS — K4091 Unilateral inguinal hernia, without obstruction or gangrene, recurrent: Secondary | ICD-10-CM | POA: Diagnosis not present

## 2018-12-20 DIAGNOSIS — E119 Type 2 diabetes mellitus without complications: Secondary | ICD-10-CM | POA: Diagnosis not present

## 2018-12-23 DIAGNOSIS — E782 Mixed hyperlipidemia: Secondary | ICD-10-CM | POA: Diagnosis not present

## 2018-12-23 DIAGNOSIS — Z471 Aftercare following joint replacement surgery: Secondary | ICD-10-CM | POA: Diagnosis not present

## 2018-12-23 DIAGNOSIS — Z9181 History of falling: Secondary | ICD-10-CM | POA: Diagnosis not present

## 2018-12-23 DIAGNOSIS — I10 Essential (primary) hypertension: Secondary | ICD-10-CM | POA: Diagnosis not present

## 2018-12-23 DIAGNOSIS — E119 Type 2 diabetes mellitus without complications: Secondary | ICD-10-CM | POA: Diagnosis not present

## 2018-12-23 DIAGNOSIS — K4091 Unilateral inguinal hernia, without obstruction or gangrene, recurrent: Secondary | ICD-10-CM | POA: Diagnosis not present

## 2018-12-23 DIAGNOSIS — I2510 Atherosclerotic heart disease of native coronary artery without angina pectoris: Secondary | ICD-10-CM | POA: Diagnosis not present

## 2018-12-23 DIAGNOSIS — Z96651 Presence of right artificial knee joint: Secondary | ICD-10-CM | POA: Diagnosis not present

## 2018-12-24 DIAGNOSIS — Z471 Aftercare following joint replacement surgery: Secondary | ICD-10-CM | POA: Diagnosis not present

## 2018-12-24 DIAGNOSIS — I10 Essential (primary) hypertension: Secondary | ICD-10-CM | POA: Diagnosis not present

## 2018-12-24 DIAGNOSIS — E119 Type 2 diabetes mellitus without complications: Secondary | ICD-10-CM | POA: Diagnosis not present

## 2018-12-24 DIAGNOSIS — E782 Mixed hyperlipidemia: Secondary | ICD-10-CM | POA: Diagnosis not present

## 2018-12-24 DIAGNOSIS — Z9181 History of falling: Secondary | ICD-10-CM | POA: Diagnosis not present

## 2018-12-24 DIAGNOSIS — K4091 Unilateral inguinal hernia, without obstruction or gangrene, recurrent: Secondary | ICD-10-CM | POA: Diagnosis not present

## 2018-12-24 DIAGNOSIS — Z96651 Presence of right artificial knee joint: Secondary | ICD-10-CM | POA: Diagnosis not present

## 2018-12-24 DIAGNOSIS — I2510 Atherosclerotic heart disease of native coronary artery without angina pectoris: Secondary | ICD-10-CM | POA: Diagnosis not present

## 2018-12-25 DIAGNOSIS — Z9181 History of falling: Secondary | ICD-10-CM | POA: Diagnosis not present

## 2018-12-25 DIAGNOSIS — Z471 Aftercare following joint replacement surgery: Secondary | ICD-10-CM | POA: Diagnosis not present

## 2018-12-25 DIAGNOSIS — E119 Type 2 diabetes mellitus without complications: Secondary | ICD-10-CM | POA: Diagnosis not present

## 2018-12-25 DIAGNOSIS — K4091 Unilateral inguinal hernia, without obstruction or gangrene, recurrent: Secondary | ICD-10-CM | POA: Diagnosis not present

## 2018-12-25 DIAGNOSIS — Z96651 Presence of right artificial knee joint: Secondary | ICD-10-CM | POA: Diagnosis not present

## 2018-12-25 DIAGNOSIS — I2510 Atherosclerotic heart disease of native coronary artery without angina pectoris: Secondary | ICD-10-CM | POA: Diagnosis not present

## 2018-12-25 DIAGNOSIS — E782 Mixed hyperlipidemia: Secondary | ICD-10-CM | POA: Diagnosis not present

## 2018-12-25 DIAGNOSIS — I10 Essential (primary) hypertension: Secondary | ICD-10-CM | POA: Diagnosis not present

## 2018-12-26 ENCOUNTER — Ambulatory Visit (INDEPENDENT_AMBULATORY_CARE_PROVIDER_SITE_OTHER): Payer: Medicare Other | Admitting: Orthopaedic Surgery

## 2018-12-27 DIAGNOSIS — E119 Type 2 diabetes mellitus without complications: Secondary | ICD-10-CM | POA: Diagnosis not present

## 2018-12-27 DIAGNOSIS — K4091 Unilateral inguinal hernia, without obstruction or gangrene, recurrent: Secondary | ICD-10-CM | POA: Diagnosis not present

## 2018-12-27 DIAGNOSIS — Z9181 History of falling: Secondary | ICD-10-CM | POA: Diagnosis not present

## 2018-12-27 DIAGNOSIS — Z471 Aftercare following joint replacement surgery: Secondary | ICD-10-CM | POA: Diagnosis not present

## 2018-12-27 DIAGNOSIS — E782 Mixed hyperlipidemia: Secondary | ICD-10-CM | POA: Diagnosis not present

## 2018-12-27 DIAGNOSIS — I10 Essential (primary) hypertension: Secondary | ICD-10-CM | POA: Diagnosis not present

## 2018-12-27 DIAGNOSIS — Z96651 Presence of right artificial knee joint: Secondary | ICD-10-CM | POA: Diagnosis not present

## 2018-12-27 DIAGNOSIS — I2510 Atherosclerotic heart disease of native coronary artery without angina pectoris: Secondary | ICD-10-CM | POA: Diagnosis not present

## 2018-12-30 DIAGNOSIS — I2510 Atherosclerotic heart disease of native coronary artery without angina pectoris: Secondary | ICD-10-CM | POA: Diagnosis not present

## 2018-12-30 DIAGNOSIS — E782 Mixed hyperlipidemia: Secondary | ICD-10-CM | POA: Diagnosis not present

## 2018-12-30 DIAGNOSIS — Z9181 History of falling: Secondary | ICD-10-CM | POA: Diagnosis not present

## 2018-12-30 DIAGNOSIS — K4091 Unilateral inguinal hernia, without obstruction or gangrene, recurrent: Secondary | ICD-10-CM | POA: Diagnosis not present

## 2018-12-30 DIAGNOSIS — I10 Essential (primary) hypertension: Secondary | ICD-10-CM | POA: Diagnosis not present

## 2018-12-30 DIAGNOSIS — Z471 Aftercare following joint replacement surgery: Secondary | ICD-10-CM | POA: Diagnosis not present

## 2018-12-30 DIAGNOSIS — E119 Type 2 diabetes mellitus without complications: Secondary | ICD-10-CM | POA: Diagnosis not present

## 2018-12-30 DIAGNOSIS — Z96651 Presence of right artificial knee joint: Secondary | ICD-10-CM | POA: Diagnosis not present

## 2019-01-01 ENCOUNTER — Ambulatory Visit (INDEPENDENT_AMBULATORY_CARE_PROVIDER_SITE_OTHER): Payer: Medicare Other | Admitting: Orthopaedic Surgery

## 2019-01-01 ENCOUNTER — Encounter (INDEPENDENT_AMBULATORY_CARE_PROVIDER_SITE_OTHER): Payer: Self-pay | Admitting: Orthopaedic Surgery

## 2019-01-01 DIAGNOSIS — Z96651 Presence of right artificial knee joint: Secondary | ICD-10-CM

## 2019-01-01 MED ORDER — HYDROMORPHONE HCL 2 MG PO TABS: 2.0000 mg | ORAL_TABLET | ORAL | 0 refills | Status: DC | PRN

## 2019-01-01 NOTE — Progress Notes (Signed)
The patient is about 6 weeks status post a right total knee arthroplasty.  He still having home therapy due to the delay after being in skilled nursing.  We also started him on doxycycline 2 weeks ago just due to a little bit of drainage of the knee.  That is since calmed down and he has no issues.  Therapy is coming to his house still.  They want to extend this for another few weeks which I agree with since he is a fall risk.  They do come 2-3 times a week.  On exam he has almost full extension of the right knee.  There is still swelling.  There is no evidence of infection at all no drainage of the incision at all.  His calf is soft.  He can only flex to about 80 degrees.  He still needs aggressive physical therapy to get his knee moving and bending.  I will see him back in 4 weeks to see how he is doing overall.  It is not beyond 90 degrees then we will need to manipulate the knee.

## 2019-01-02 DIAGNOSIS — Z9181 History of falling: Secondary | ICD-10-CM | POA: Diagnosis not present

## 2019-01-02 DIAGNOSIS — K4091 Unilateral inguinal hernia, without obstruction or gangrene, recurrent: Secondary | ICD-10-CM | POA: Diagnosis not present

## 2019-01-02 DIAGNOSIS — I2510 Atherosclerotic heart disease of native coronary artery without angina pectoris: Secondary | ICD-10-CM | POA: Diagnosis not present

## 2019-01-02 DIAGNOSIS — I10 Essential (primary) hypertension: Secondary | ICD-10-CM | POA: Diagnosis not present

## 2019-01-02 DIAGNOSIS — E782 Mixed hyperlipidemia: Secondary | ICD-10-CM | POA: Diagnosis not present

## 2019-01-02 DIAGNOSIS — Z471 Aftercare following joint replacement surgery: Secondary | ICD-10-CM | POA: Diagnosis not present

## 2019-01-02 DIAGNOSIS — E119 Type 2 diabetes mellitus without complications: Secondary | ICD-10-CM | POA: Diagnosis not present

## 2019-01-02 DIAGNOSIS — Z96651 Presence of right artificial knee joint: Secondary | ICD-10-CM | POA: Diagnosis not present

## 2019-01-06 DIAGNOSIS — K4091 Unilateral inguinal hernia, without obstruction or gangrene, recurrent: Secondary | ICD-10-CM | POA: Diagnosis not present

## 2019-01-06 DIAGNOSIS — Z96651 Presence of right artificial knee joint: Secondary | ICD-10-CM | POA: Diagnosis not present

## 2019-01-06 DIAGNOSIS — I2510 Atherosclerotic heart disease of native coronary artery without angina pectoris: Secondary | ICD-10-CM | POA: Diagnosis not present

## 2019-01-06 DIAGNOSIS — E782 Mixed hyperlipidemia: Secondary | ICD-10-CM | POA: Diagnosis not present

## 2019-01-06 DIAGNOSIS — I10 Essential (primary) hypertension: Secondary | ICD-10-CM | POA: Diagnosis not present

## 2019-01-06 DIAGNOSIS — Z471 Aftercare following joint replacement surgery: Secondary | ICD-10-CM | POA: Diagnosis not present

## 2019-01-06 DIAGNOSIS — E119 Type 2 diabetes mellitus without complications: Secondary | ICD-10-CM | POA: Diagnosis not present

## 2019-01-06 DIAGNOSIS — Z9181 History of falling: Secondary | ICD-10-CM | POA: Diagnosis not present

## 2019-01-08 DIAGNOSIS — I10 Essential (primary) hypertension: Secondary | ICD-10-CM | POA: Diagnosis not present

## 2019-01-08 DIAGNOSIS — E782 Mixed hyperlipidemia: Secondary | ICD-10-CM | POA: Diagnosis not present

## 2019-01-08 DIAGNOSIS — E119 Type 2 diabetes mellitus without complications: Secondary | ICD-10-CM | POA: Diagnosis not present

## 2019-01-08 DIAGNOSIS — Z96651 Presence of right artificial knee joint: Secondary | ICD-10-CM | POA: Diagnosis not present

## 2019-01-08 DIAGNOSIS — I2510 Atherosclerotic heart disease of native coronary artery without angina pectoris: Secondary | ICD-10-CM | POA: Diagnosis not present

## 2019-01-08 DIAGNOSIS — K4091 Unilateral inguinal hernia, without obstruction or gangrene, recurrent: Secondary | ICD-10-CM | POA: Diagnosis not present

## 2019-01-08 DIAGNOSIS — Z471 Aftercare following joint replacement surgery: Secondary | ICD-10-CM | POA: Diagnosis not present

## 2019-01-08 DIAGNOSIS — Z9181 History of falling: Secondary | ICD-10-CM | POA: Diagnosis not present

## 2019-01-13 DIAGNOSIS — I10 Essential (primary) hypertension: Secondary | ICD-10-CM | POA: Diagnosis not present

## 2019-01-13 DIAGNOSIS — Z471 Aftercare following joint replacement surgery: Secondary | ICD-10-CM | POA: Diagnosis not present

## 2019-01-13 DIAGNOSIS — Z96651 Presence of right artificial knee joint: Secondary | ICD-10-CM | POA: Diagnosis not present

## 2019-01-13 DIAGNOSIS — Z9181 History of falling: Secondary | ICD-10-CM | POA: Diagnosis not present

## 2019-01-13 DIAGNOSIS — E782 Mixed hyperlipidemia: Secondary | ICD-10-CM | POA: Diagnosis not present

## 2019-01-13 DIAGNOSIS — E119 Type 2 diabetes mellitus without complications: Secondary | ICD-10-CM | POA: Diagnosis not present

## 2019-01-13 DIAGNOSIS — I2510 Atherosclerotic heart disease of native coronary artery without angina pectoris: Secondary | ICD-10-CM | POA: Diagnosis not present

## 2019-01-13 DIAGNOSIS — K4091 Unilateral inguinal hernia, without obstruction or gangrene, recurrent: Secondary | ICD-10-CM | POA: Diagnosis not present

## 2019-01-15 DIAGNOSIS — Z9181 History of falling: Secondary | ICD-10-CM | POA: Diagnosis not present

## 2019-01-15 DIAGNOSIS — E119 Type 2 diabetes mellitus without complications: Secondary | ICD-10-CM | POA: Diagnosis not present

## 2019-01-15 DIAGNOSIS — K4091 Unilateral inguinal hernia, without obstruction or gangrene, recurrent: Secondary | ICD-10-CM | POA: Diagnosis not present

## 2019-01-15 DIAGNOSIS — I10 Essential (primary) hypertension: Secondary | ICD-10-CM | POA: Diagnosis not present

## 2019-01-15 DIAGNOSIS — I2510 Atherosclerotic heart disease of native coronary artery without angina pectoris: Secondary | ICD-10-CM | POA: Diagnosis not present

## 2019-01-15 DIAGNOSIS — E782 Mixed hyperlipidemia: Secondary | ICD-10-CM | POA: Diagnosis not present

## 2019-01-15 DIAGNOSIS — Z96651 Presence of right artificial knee joint: Secondary | ICD-10-CM | POA: Diagnosis not present

## 2019-01-15 DIAGNOSIS — Z471 Aftercare following joint replacement surgery: Secondary | ICD-10-CM | POA: Diagnosis not present

## 2019-01-16 DIAGNOSIS — E785 Hyperlipidemia, unspecified: Secondary | ICD-10-CM | POA: Diagnosis not present

## 2019-01-16 DIAGNOSIS — Z79899 Other long term (current) drug therapy: Secondary | ICD-10-CM | POA: Diagnosis not present

## 2019-01-16 DIAGNOSIS — E538 Deficiency of other specified B group vitamins: Secondary | ICD-10-CM | POA: Diagnosis not present

## 2019-01-16 DIAGNOSIS — E119 Type 2 diabetes mellitus without complications: Secondary | ICD-10-CM | POA: Diagnosis not present

## 2019-01-20 ENCOUNTER — Other Ambulatory Visit: Payer: Self-pay

## 2019-01-20 NOTE — Patient Outreach (Signed)
[place] [facility]) Care Management  01/20/2019  TIERRA DIVELBISS 1951/02/26 [id number]   Medication Adherence call to Mr. Gayle Collard patient did not answer patient is due on Rosuvastatin 20 mg and Telmisartan 80 mg. [facility] said last pick up was on 12/14/18 on both medications. Mr. Dollard is showing past due under [place].   [place] Management Direct Dial [phone]  Fax [phone] [email or web address]

## 2019-01-21 DIAGNOSIS — R6889 Other general symptoms and signs: Secondary | ICD-10-CM | POA: Diagnosis not present

## 2019-01-22 DIAGNOSIS — I10 Essential (primary) hypertension: Secondary | ICD-10-CM | POA: Diagnosis not present

## 2019-01-22 DIAGNOSIS — E119 Type 2 diabetes mellitus without complications: Secondary | ICD-10-CM | POA: Diagnosis not present

## 2019-01-22 DIAGNOSIS — Z471 Aftercare following joint replacement surgery: Secondary | ICD-10-CM | POA: Diagnosis not present

## 2019-01-22 DIAGNOSIS — I2510 Atherosclerotic heart disease of native coronary artery without angina pectoris: Secondary | ICD-10-CM | POA: Diagnosis not present

## 2019-01-22 DIAGNOSIS — Z9181 History of falling: Secondary | ICD-10-CM | POA: Diagnosis not present

## 2019-01-22 DIAGNOSIS — E782 Mixed hyperlipidemia: Secondary | ICD-10-CM | POA: Diagnosis not present

## 2019-01-22 DIAGNOSIS — K4091 Unilateral inguinal hernia, without obstruction or gangrene, recurrent: Secondary | ICD-10-CM | POA: Diagnosis not present

## 2019-01-22 DIAGNOSIS — Z96651 Presence of right artificial knee joint: Secondary | ICD-10-CM | POA: Diagnosis not present

## 2019-01-24 DIAGNOSIS — I10 Essential (primary) hypertension: Secondary | ICD-10-CM | POA: Diagnosis not present

## 2019-01-24 DIAGNOSIS — E782 Mixed hyperlipidemia: Secondary | ICD-10-CM | POA: Diagnosis not present

## 2019-01-24 DIAGNOSIS — K4091 Unilateral inguinal hernia, without obstruction or gangrene, recurrent: Secondary | ICD-10-CM | POA: Diagnosis not present

## 2019-01-24 DIAGNOSIS — Z96651 Presence of right artificial knee joint: Secondary | ICD-10-CM | POA: Diagnosis not present

## 2019-01-24 DIAGNOSIS — Z471 Aftercare following joint replacement surgery: Secondary | ICD-10-CM | POA: Diagnosis not present

## 2019-01-24 DIAGNOSIS — E119 Type 2 diabetes mellitus without complications: Secondary | ICD-10-CM | POA: Diagnosis not present

## 2019-01-24 DIAGNOSIS — I2510 Atherosclerotic heart disease of native coronary artery without angina pectoris: Secondary | ICD-10-CM | POA: Diagnosis not present

## 2019-01-24 DIAGNOSIS — Z9181 History of falling: Secondary | ICD-10-CM | POA: Diagnosis not present

## 2019-01-29 ENCOUNTER — Encounter (INDEPENDENT_AMBULATORY_CARE_PROVIDER_SITE_OTHER): Payer: Self-pay | Admitting: Orthopaedic Surgery

## 2019-01-29 ENCOUNTER — Ambulatory Visit (INDEPENDENT_AMBULATORY_CARE_PROVIDER_SITE_OTHER): Payer: Medicare Other | Admitting: Orthopaedic Surgery

## 2019-01-29 ENCOUNTER — Other Ambulatory Visit: Payer: Self-pay

## 2019-01-29 DIAGNOSIS — Z96651 Presence of right artificial knee joint: Secondary | ICD-10-CM

## 2019-01-29 NOTE — Addendum Note (Signed)
Addended byLaurann [place] on: 01/29/2019 04:19 PM   Modules accepted: Orders

## 2019-01-29 NOTE — Progress Notes (Signed)
The patient is now 75 days status post a right total knee arthroplasty.  He is actually only been in home therapy and not been in outpatient therapy.  He feels like he is making progress.  His only complaint is getting sleep at night.  He is not taking any narcotics either.  The signs with him.  He is ambulating with a cane and has been exercising most days.  On exam he lacks full extension by about 3 degrees and I can flex him to barely past 90 degrees for his right knee.  There is still some swelling but no significant effusion.  The knee feels ligamentously stable.  It is essential we set him up for outpatient physical therapy.  He is closer to [facility] so we can set up outpatient physical therapy on his right knee to work on strengthening and range of motion as well as his continued bouts of coordination.  We will see him back in 4 weeks to see how is doing overall.

## 2019-02-10 ENCOUNTER — Other Ambulatory Visit: Payer: Self-pay

## 2019-02-10 ENCOUNTER — Ambulatory Visit: Payer: Medicare Other | Attending: Orthopaedic Surgery | Admitting: Physical Therapy

## 2019-02-10 DIAGNOSIS — R600 Localized edema: Secondary | ICD-10-CM | POA: Diagnosis not present

## 2019-02-10 DIAGNOSIS — R262 Difficulty in walking, not elsewhere classified: Secondary | ICD-10-CM | POA: Diagnosis not present

## 2019-02-10 DIAGNOSIS — M25661 Stiffness of right knee, not elsewhere classified: Secondary | ICD-10-CM

## 2019-02-10 DIAGNOSIS — M25561 Pain in right knee: Secondary | ICD-10-CM | POA: Diagnosis not present

## 2019-02-10 DIAGNOSIS — M6281 Muscle weakness (generalized): Secondary | ICD-10-CM | POA: Diagnosis not present

## 2019-02-10 DIAGNOSIS — R2689 Other abnormalities of gait and mobility: Secondary | ICD-10-CM | POA: Diagnosis not present

## 2019-02-10 NOTE — Therapy (Signed)
[facility] [address]  [place], [place], [address] Phone: [phone]   Fax:  [phone]  Physical Therapy Evaluation  Patient Details  Name: [name] [name] MRN: [id number] Date of Birth: [birth year] Referring Provider (PT): Jean Rosenthal, MD   Encounter Date: 02/10/2019  PT End of Session - 02/10/19 1047    Visit Number  1    Number of Visits  12    Date for PT Re-Evaluation  03/24/19    Authorization Type  [employer] Medicare    PT Start Time  6440    PT Stop Time  1158    PT Time Calculation (min)  71 min    Activity Tolerance  Patient tolerated treatment well    Behavior During Therapy  [facility] for tasks assessed/performed       Past Medical History:  Diagnosis Date  . 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 DM (diabetes mellitus) ([place])   . HTN (hypertension)   . Hyperlipidemia     Past Surgical History:  Procedure Laterality Date  . CORONARY ANGIOPLASTY WITH STENT PLACEMENT    . LAPAROSCOPIC INGUINAL HERNIA REPAIR    . TOTAL KNEE ARTHROPLASTY Right 11/15/2018   Procedure: RIGHT TOTAL KNEE ARTHROPLASTY;  Surgeon: Mcarthur Rossetti, MD;  Location: [facility];  Service: Orthopedics;  Laterality: Right;    There were no vitals filed for this visit.   Subjective Assessment - 02/10/19 1050    Subjective  Pt reports he initially went to SNF rehab x ~3 weeks but developed an infection in his distal incision requiring antibiotics. Upon discharge received HH PT 2-3x/wk for a few weeks, completed several weeks ago. Has continued to work on HEP on his own and tries to walk as much as he can for exercise. Still notes ongoing swelling & tightness.     Pertinent History  R TKR 11/15/18    Limitations  Standing;Walking;House hold activities    How long can you stand comfortably?  15 minutes    How long can you walk  comfortably?  30 minutes    Patient Stated Goals  "unlimited walking and be able to ride my ([employer]) bicycle"    Currently in Pain?  Yes    Pain Score  0-No pain   2-4/10 on average   Pain Location  Knee    Pain Orientation  Right;Anterior    Pain Descriptors / Indicators  Sore;Tightness    Pain Type  Surgical pain;Acute pain    Pain Onset  More than a month ago    Pain Frequency  Intermittent    Aggravating Factors   bending knee to end range    Pain Relieving Factors  Tylenol, occasional icing    Effect of Pain on Daily Activities  difficulty putting shoe on, climbing in/out tub shower         [facility] PT Assessment - 02/10/19 1047      Assessment   Medical Diagnosis  R TKR    Referring Provider (PT)  Jean Rosenthal, MD    Onset Date/Surgical Date  11/15/18    Next MD Visit  02/27/19    Prior Therapy  SNF rehab & [facility] PT following surgery      Precautions   Precautions  Fall      Balance Screen   Has the patient fallen  in the past 6 months  Yes    How many times?  1    Has the patient had a decrease in activity level because of a fear of falling?   Yes    Is the patient reluctant to leave their home because of a fear of falling?   No      Home Environment   Living Environment  Private residence    Living Arrangements  Alone    Type of [place] Access  Stairs to enter    Entrance Stairs-Number of Steps  3    Entrance Stairs-Rails  Left    Home Layout  Two level;Bed/bath upstairs    Alternate Level Stairs-Number of Steps  14    Alternate Level Stairs-Rails  Left    Home Equipment  Cane - single point;Grab bars - tub/shower;Bedside commode;Shower seat      Prior Function   Level of Independence  Independent    Vocation  Retired    Leisure  watching sports, vintage cars, walking in neighborhood 2-3/wk      Cognition   Overall Cognitive Status  Within Functional Limits for tasks assessed      Observation/Other Assessments   Focus on Therapeutic  Outcomes (FOTO)   Knee - 50% (50% limitation); Predicted 65% (35% limitation)      ROM / Strength   AROM / PROM / Strength  AROM;PROM;Strength      AROM   AROM Assessment Site  Knee    Right/Left Knee  Right;Left    Right Knee Extension  8   10 dg with LAQ   Right Knee Flexion  94    Left Knee Extension  0    Left Knee Flexion  122      PROM   PROM Assessment Site  Knee    Right/Left Knee  Right    Right Knee Extension  7    Right Knee Flexion  95      Strength   Strength Assessment Site  Hip;Knee    Right/Left Hip  Right;Left    Right Hip Flexion  4/5    Right Hip Extension  4-/5    Right Hip ABduction  4-/5    Right Hip ADduction  4-/5    Left Hip Flexion  5/5    Left Hip Extension  4+/5    Left Hip ABduction  5/5    Left Hip ADduction  4/5    Right/Left Knee  Right;Left    Right Knee Flexion  4/5    Right Knee Extension  4+/5    Left Knee Flexion  5/5    Left Knee Extension  5/5      Flexibility   Soft Tissue Assessment /Muscle Length  yes    Hamstrings  mod/severe tight B    ITB  mod tight R    Piriformis  mod tight R      Ambulation/Gait   Assistive device  Straight cane    Gait Pattern  Step-through pattern;Decreased hip/knee flexion - right;Decreased weight shift to right    Ambulation Surface  Level;Indoor                Objective measurements completed on examination: See above findings.      [facility] Adult PT Treatment/Exercise - 02/10/19 1047      Exercises   Exercises  Knee/Hip      Knee/Hip Exercises: Stretches   Passive Hamstring Stretch  Right;30 seconds;1 rep  Passive Hamstring Stretch Limitations  supine with strap    Hip Flexor Stretch  Right;30 seconds;1 rep    Hip Flexor Stretch Limitations  mod thomas RF stretch with strap    Knee: Self-Stretch Limitations  verbal/visual instruction in step stretch    ITB Stretch  Right;30 seconds;1 rep    ITB Stretch Limitations  supine with strap    Other Knee/Hip Stretches   Verbal/visual instruction in seated knee extension stretch      Knee/Hip Exercises: Standing   Heel Raises  Both;10 reps;3 seconds    Heel Raises Limitations  cues for quad & glute set to encourage knee extension    Functional Squat  10 reps;5 seconds    Functional Squat Limitations  cues to avoid knees forward of toes      Knee/Hip Exercises: Supine   Straight Leg Raises  Right;15 reps   3" hold            PT Education - 02/10/19 1158    Education Details  PTeval findings, anticipated POC and initial HEP    Person(s) Educated  Patient    Methods  Explanation;Demonstration;Handout    Comprehension  Verbalized understanding;Returned demonstration       PT Short Term Goals - 02/10/19 1158      PT SHORT TERM GOAL #1   Title  Independent with initial HEP    Status  New    Target Date  02/24/19      PT SHORT TERM GOAL #2   Title  R knee AROM >/= 5-105 dg    Status  New    Target Date  03/03/19        PT Long Term Goals - 02/10/19 1158      PT LONG TERM GOAL #1   Title  Independent with ongoing HEP    Status  New    Target Date  03/24/19      PT LONG TERM GOAL #2   Title  R knee AROM >/= 3-115 dg to allow for normal gait and stair pattern    Status  New    Target Date  03/24/19      PT LONG TERM GOAL #3   Title  B LE strength >/= 4+/5 for improved stability    Status  New    Target Date  03/24/19      PT LONG TERM GOAL #4   Title  Patient will ambulate with normal gait pattern w/o AD to increase ease of community access    Status  New    Target Date  03/24/19      PT LONG TERM GOAL #5   Title  Patient will ascend/descend 1 flight of stairs with good reciprocal pattern    Status  New    Target Date  03/24/19      PT LONG TERM GOAL #6   Title  Patient will report ability to ride [employer] bike with full revolutions    Status  New    Target Date  03/24/19             Plan - 02/10/19 1158    Clinical Impression Statement  [name] [name] is a 67  y/o male who presents to OP PT 3 months s/p R TKR on 11/16/19. Post-op course complicated by incisional infection requiring antibiotic treatment and resulting in extended skilled nursing rehab followed by several weeks of [facility]. He reports he has continued to work on [employer] HEP exercises, partial revolutions on  his home [employer]ird[employer] bike and walking but still feels limited by the R knee. He arrives to PT ambulating with SPC on L with mild antalgic gait pattern favoring R LE with decreased gait velocity, decreased stride length, decreased weight shift to R, and decreased hip and knee flexion on R. Assessment reveals limited AROM of R knee - currently 8-94 dg, with patient reporting MD wanting to proceed with manipulation however patient refusing.  He also demonstrates moderate to severely decreased proximal LE flexibility with decreased R patellar mobility in all planes, decreased incisional scar mobility, mild LE edema and mild to moderate B hip and R knee weakness. [place] will benefit from skilled PT intervention to address the above listed deficits to allow for improved balance and gait to maximize function and mobility.    Personal Factors and Comorbidities  Comorbidity 3+;Past/Current Experience;Time since onset of injury/illness/exacerbation    Comorbidities  post-op TKR incisional infection; DM-type II; CAD s/p stents; HTN    Examination-Activity Limitations  Bathing;Bend;Dressing;Locomotion Level;Sit;Squat;Stairs;Stand;Toileting;Transfers    Examination-Participation Restrictions  Community Activity;Yard Work    Merchant [employer] officer  Evolving/Moderate complexity    Clinical Decision Making  Moderate    Rehab Potential  Good    PT Frequency  2x / week    PT Duration  6 weeks    PT Treatment/Interventions  Patient/family education;Therapeutic exercise;Therapeutic activities;Functional mobility training;Gait training;Stair training;Balance training;Neuromuscular re-education;Manual  techniques;Scar mobilization;Passive range of motion;Dry needling;Taping;Joint Manipulations;Cryotherapy;Vasopneumatic Device;Electrical Stimulation;Iontophoresis 4mg /ml Dexamethasone;Moist Heat;ADLs/Self Care Home Management    PT Next Visit Plan  Review initial OP HEP as well as further HH PT exercises; R knee ROM; proximal LE strenghtening    Consulted and Agree with Plan of Care  Patient       Patient will benefit from skilled therapeutic intervention in order to improve the following deficits and impairments:  Abnormal gait, Decreased activity tolerance, Decreased balance, Decreased endurance, Decreased range of motion, Decreased strength, Difficulty walking, Hypomobility, Increased edema, Increased muscle spasms, Impaired flexibility, Improper body mechanics, Pain  Visit Diagnosis: Stiffness of right knee, not elsewhere classified - Plan: PT plan of care cert/re-cert  Acute pain of right knee - Plan: PT plan of care cert/re-cert  Muscle weakness (generalized) - Plan: PT plan of care cert/re-cert  Other abnormalities of gait and mobility - Plan: PT plan of care cert/re-cert  Difficulty in walking, not elsewhere classified - Plan: PT plan of care cert/re-cert  Localized edema - Plan: PT plan of care cert/re-cert     Problem List Patient Active Problem List   Diagnosis Date Noted  . Unilateral primary osteoarthritis, right knee 11/15/2018  . 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 Depressive disorder 11/12/2018  . 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 History of colonic polyps 11/12/2018  . History of nephrolithiasis 11/12/2018  . History of smoking 11/12/2018  . 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 Medication intolerance 11/12/2018  . Old myocardial infarction 11/12/2018  . Essential hypertension 11/12/2018  . Mixed hyperlipidemia 11/12/2018  . Pre-operative  cardiovascular examination 11/12/2018  . CAD (coronary artery disease) 11/12/2018  . Bilateral leg edema 05/04/2017  . Primary osteoarthritis of both knees 03/05/2017  . Type II diabetes mellitus with manifestations ([place]) 08/18/2016  . CAP (community acquired pneumonia) 08/17/2016  . Right carotid bruit 08/05/2014  . HYPERLIPIDEMIA-MIXED 06/15/2009  . HYPERTENSION, BENIGN 06/15/2009  . CAD, NATIVE VESSEL 06/15/2009  . Gout 10/14/2007    [name] [name]  [name] [name] 02/10/2019, 1:08 PM  [facility] [address]  [place], [place], [address] Phone: [phone]   Fax:  [phone]  Name: [name] [name] MRN: [id number] Date of Birth: 03/25/1951

## 2019-02-13 ENCOUNTER — Other Ambulatory Visit: Payer: Self-pay

## 2019-02-13 ENCOUNTER — Ambulatory Visit: Payer: Medicare Other

## 2019-02-13 DIAGNOSIS — R262 Difficulty in walking, not elsewhere classified: Secondary | ICD-10-CM

## 2019-02-13 DIAGNOSIS — M25561 Pain in right knee: Secondary | ICD-10-CM | POA: Diagnosis not present

## 2019-02-13 DIAGNOSIS — M6281 Muscle weakness (generalized): Secondary | ICD-10-CM | POA: Diagnosis not present

## 2019-02-13 DIAGNOSIS — R2689 Other abnormalities of gait and mobility: Secondary | ICD-10-CM | POA: Diagnosis not present

## 2019-02-13 DIAGNOSIS — R600 Localized edema: Secondary | ICD-10-CM | POA: Diagnosis not present

## 2019-02-13 DIAGNOSIS — M25661 Stiffness of right knee, not elsewhere classified: Secondary | ICD-10-CM | POA: Diagnosis not present

## 2019-02-13 NOTE — Therapy (Signed)
[facility] [address]  [place], [place], [address] Phone: [phone]   Fax:  [phone]  Physical Therapy Treatment All interventions performed day of treatment.   Patient Details   Name: [name] [name] MRN: [id number] Date of Birth: [birth year] Referring Provider (PT): Jean Rosenthal, MD   Encounter Date: 02/13/2019  PT End of Session - 02/13/19 1156    Visit Number  2    Number of Visits  12    Date for PT Re-Evaluation  03/24/19    Authorization Type  [employer] Medicare    PT Start Time  1151    PT Stop Time  1240    PT Time Calculation (min)  49 min    Activity Tolerance  Patient tolerated treatment well    Behavior During Therapy  [facility] for tasks assessed/performed       Past Medical History:  Diagnosis Date  . 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 DM (diabetes mellitus) ([place])   . HTN (hypertension)   . Hyperlipidemia     Past Surgical History:  Procedure Laterality Date  . CORONARY ANGIOPLASTY WITH STENT PLACEMENT    . LAPAROSCOPIC INGUINAL HERNIA REPAIR    . TOTAL KNEE ARTHROPLASTY Right 11/15/2018   Procedure: RIGHT TOTAL KNEE ARTHROPLASTY;  Surgeon: Mcarthur Rossetti, MD;  Location: [facility];  Service: Orthopedics;  Laterality: Right;    There were no vitals filed for this visit.  Subjective Assessment - 02/13/19 1155    Subjective  Notes he's been performing HEP daily.      Pertinent History  R TKR 11/15/18    Patient Stated Goals  "unlimited walking and be able to ride my ([employer]) bicycle"    Currently in Pain?  Yes    Pain Score  3     Pain Location  Knee   "stiffness"   Pain Orientation  Right;Anterior    Pain Descriptors / Indicators  Sore;Tightness    Pain Type  Surgical pain;Acute pain    Pain Onset  More than a month ago    Pain Frequency  Intermittent    Aggravating  Factors   bending knee     Multiple Pain Sites  No                       [facility] Adult PT Treatment/Exercise - 02/13/19 1208      Knee/Hip Exercises: Stretches   Passive Hamstring Stretch  Right;30 seconds;1 rep    Passive Hamstring Stretch Limitations  supine with strap    Hip Flexor Stretch  Right;30 seconds;1 rep    Hip Flexor Stretch Limitations  mod thomas RF stretch with strap    ITB Stretch  Right;30 seconds;1 rep    ITB Stretch Limitations  supine with strap    Other Knee/Hip Stretches  Seated R HS stretch on 4" step x 30 sec       Knee/Hip Exercises: Aerobic   Nustep  Lvl 3, 6 min (LE only)      Knee/Hip Exercises: Standing   Heel Raises  Both;15 reps    Heel Raises Limitations  cues for quad & glute set to encourage knee extension    Terminal Knee Extension  Right;15 reps;Theraband;Strengthening    Theraband Level (Terminal Knee Extension)  Level 4 (Blue)    Terminal  Knee Extension Limitations  TKE blue TB closed in door with chair support     Functional Squat  5 seconds   x 12 reps    Functional Squat Limitations  to chair with 2 airex pads     Other Standing Knee Exercises  Alternating toe-clears 2# to 9" step x 15 reps; counter support      Modalities   Modalities  Vasopneumatic      Vasopneumatic   Number Minutes Vasopneumatic   10 minutes    Vasopnuematic Location   Knee    Vasopneumatic Pressure  Low    Vasopneumatic Temperature   Coldest temp.             PT Education - 02/13/19 1236    Education Details  Pt. reporting he occasionally performs HH PT exercises: squat, standing march, seated LAQ     Person(s) Educated  Patient    Comprehension  Verbal cues required       PT Short Term Goals - 02/13/19 1156      PT SHORT TERM GOAL #1   Title  Independent with initial HEP    Status  On-going    Target Date  02/24/19      PT SHORT TERM GOAL #2   Title  R knee AROM >/= 5-105 dg    Status  On-going    Target Date  03/03/19         PT Long Term Goals - 02/13/19 1157      PT LONG TERM GOAL #1   Title  Independent with ongoing HEP    Status  On-going      PT LONG TERM GOAL #2   Title  R knee AROM >/= 3-115 dg to allow for normal gait and stair pattern    Status  On-going      PT LONG TERM GOAL #3   Title  B LE strength >/= 4+/5 for improved stability    Status  On-going      PT LONG TERM GOAL #4   Title  Patient will ambulate with normal gait pattern w/o AD to increase ease of community access    Status  On-going      PT LONG TERM GOAL #5   Title  Patient will ascend/descend 1 flight of stairs with good reciprocal pattern    Status  On-going      PT LONG TERM GOAL #6   Title  Patient will report ability to ride [employer] bike with full revolutions    Status  On-going            Plan - 02/13/19 1157    Clinical Impression Statement  Pt. reports he occasionally performs previously issued HH PT HEP of heel raise, counter squat, and heel slide.  Pt. encouraged to perform these activities secondary to current HEP issued to him last session.  Required mod cueing with review of initial ROM and strengthening HEP for proper hold times and positioning today.  Pt. verbalized understanding.  Tolerated minor progression of strengthening activities well today.  Ended visit with application of ice/compression to R knee to reduce visible post-exercise swelling.      Personal Factors and Comorbidities  Comorbidity 3+;Past/Current Experience;Time since onset of injury/illness/exacerbation    Comorbidities  post-op TKR incisional infection; DM-type II; CAD s/p stents; HTN    Examination-Activity Limitations  Bathing;Bend;Dressing;Locomotion Level;Sit;Squat;Stairs;Stand;Toileting;Transfers    Examination-Participation Restrictions  Community Activity;Yard Work    [employer]  Evolving/Moderate complexity  Rehab Potential  Good    PT Treatment/Interventions  Patient/family  education;Therapeutic exercise;Therapeutic activities;Functional mobility training;Gait training;Stair training;Balance training;Neuromuscular re-education;Manual techniques;Scar mobilization;Passive range of motion;Dry needling;Taping;Joint Manipulations;Cryotherapy;Vasopneumatic Device;Electrical Stimulation;Iontophoresis 4mg /ml Dexamethasone;Moist Heat;ADLs/Self Care Home Management    PT Next Visit Plan  R knee ROM; proximal LE strenghtening    Consulted and Agree with Plan of Care  Patient       Patient will benefit from skilled therapeutic intervention in order to improve the following deficits and impairments:  Abnormal gait, Decreased activity tolerance, Decreased balance, Decreased endurance, Decreased range of motion, Decreased strength, Difficulty walking, Hypomobility, Increased edema, Increased muscle spasms, Impaired flexibility, Improper body mechanics, Pain  Visit Diagnosis: Stiffness of right knee, not elsewhere classified  Acute pain of right knee  Muscle weakness (generalized)  Other abnormalities of gait and mobility  Difficulty in walking, not elsewhere classified  Localized edema     Problem List Patient Active Problem List   Diagnosis Date Noted  . Unilateral primary osteoarthritis, right knee 11/15/2018  . 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 Depressive disorder 11/12/2018  . 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 History of colonic polyps 11/12/2018  . History of nephrolithiasis 11/12/2018  . History of smoking 11/12/2018  . 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 Medication intolerance 11/12/2018  . Old myocardial infarction 11/12/2018  . Essential hypertension 11/12/2018  . Mixed hyperlipidemia 11/12/2018  . Pre-operative cardiovascular examination 11/12/2018  . CAD (coronary artery disease) 11/12/2018  . Bilateral leg edema 05/04/2017  .  Primary osteoarthritis of both knees 03/05/2017  . Type II diabetes mellitus with manifestations ([place]) 08/18/2016  . CAP (community acquired pneumonia) 08/17/2016  . Right carotid bruit 08/05/2014  . HYPERLIPIDEMIA-MIXED 06/15/2009  . HYPERTENSION, BENIGN 06/15/2009  . CAD, NATIVE VESSEL 06/15/2009  . Gout 10/14/2007    Bess Harvest, PTA 02/14/19 1:09 PM   [facility] [address]  [place], [place], [address] Phone: [phone]   Fax:  [phone]  Name: [name] [name] MRN: [id number] Date of Birth: 1951/02/06

## 2019-02-17 ENCOUNTER — Encounter: Payer: Self-pay | Admitting: Physical Therapy

## 2019-02-17 ENCOUNTER — Other Ambulatory Visit: Payer: Self-pay

## 2019-02-17 ENCOUNTER — Ambulatory Visit: Payer: Medicare Other | Admitting: Physical Therapy

## 2019-02-17 DIAGNOSIS — M25661 Stiffness of right knee, not elsewhere classified: Secondary | ICD-10-CM | POA: Diagnosis not present

## 2019-02-17 DIAGNOSIS — M25561 Pain in right knee: Secondary | ICD-10-CM | POA: Diagnosis not present

## 2019-02-17 DIAGNOSIS — R262 Difficulty in walking, not elsewhere classified: Secondary | ICD-10-CM

## 2019-02-17 DIAGNOSIS — R600 Localized edema: Secondary | ICD-10-CM

## 2019-02-17 DIAGNOSIS — M6281 Muscle weakness (generalized): Secondary | ICD-10-CM

## 2019-02-17 DIAGNOSIS — R2689 Other abnormalities of gait and mobility: Secondary | ICD-10-CM | POA: Diagnosis not present

## 2019-02-17 NOTE — Therapy (Signed)
[facility] [address]  [place], [place], [address] Phone: [phone]   Fax:  [phone]  Physical Therapy Treatment  Patient Details  Name: [name] [name] MRN: [id number] Date of Birth: 1951/01/04 Referring Provider (PT): Jean Rosenthal, MD   Encounter Date: 02/17/2019  PT End of Session - 02/17/19 1147    Visit Number  3    Number of Visits  12    Date for PT Re-Evaluation  03/24/19    Authorization Type  [employer] Medicare    PT Start Time  1147    PT Stop Time  1254    PT Time Calculation (min)  67 min    Activity Tolerance  Patient tolerated treatment well    Behavior During Therapy  [facility] for tasks assessed/performed       Past Medical History:  Diagnosis Date  . 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 DM (diabetes mellitus) ([place])   . HTN (hypertension)   . Hyperlipidemia     Past Surgical History:  Procedure Laterality Date  . CORONARY ANGIOPLASTY WITH STENT PLACEMENT    . LAPAROSCOPIC INGUINAL HERNIA REPAIR    . TOTAL KNEE ARTHROPLASTY Right 11/15/2018   Procedure: RIGHT TOTAL KNEE ARTHROPLASTY;  Surgeon: Mcarthur Rossetti, MD;  Location: [facility];  Service: Orthopedics;  Laterality: Right;    There were no vitals filed for this visit.  Subjective Assessment - 02/17/19 1149    Subjective  Pt reports he was able to go for a walk last night.    Pertinent History  R TKR 11/15/18    Patient Stated Goals  "unlimited walking and be able to ride my ([employer]) bicycle"    Currently in Pain?  Yes    Pain Score  3    2-3/10   Pain Location  Knee    Pain Orientation  Right    Pain Descriptors / Indicators  Tightness   "stffness"   Pain Type  Surgical pain;Acute pain    Pain Frequency  Intermittent                       [facility] Adult PT Treatment/Exercise - 02/17/19 1147      Self-Care   Self-Care  Scar Mobilizations    Scar Mobilizations  Instructed pt in R knee incisional scar mobization, with pt performing return demonstration.      Exercises   Exercises  Knee/Hip      Knee/Hip Exercises: Stretches   Gastroc Stretch  Right;30 seconds;3 reps    Gastroc Stretch Limitations  standing at counter      Knee/Hip Exercises: Aerobic   Recumbent Bike  rocking to intermittent full revolutions x 6 min - no resistance      Knee/Hip Exercises: Standing   Hip Flexion  Right;Left;10 reps;Knee straight;Stengthening    Hip Flexion Limitations  red TB, UE support on back of chair    Terminal Knee Extension  Right;15 reps;Theraband;Strengthening    Theraband Level (Terminal Knee Extension)  Level 4 (Blue)    Terminal Knee Extension Limitations  cues for 5" quad & glute set pushing heel into floor; UE support on back of chair    Hip ADduction  Right;Left;10 reps;Strengthening    Hip ADduction Limitations  red TB, UE support on back of chair    Hip  Abduction  Right;Left;10 reps;Knee straight;Stengthening    Abduction Limitations  red TB, UE support on back of chair    Hip Extension  Right;Left;10 reps;Knee straight;Stengthening    Extension Limitations  red TB, UE support on back of chair      Knee/Hip Exercises: Supine   Patellar Mobs  Instructed pt in R knee patellar mobs in all directions, with pt performing return demonstration.    Knee Flexion  Both;20 reps;Right;AAROM    Knee Flexion Limitations  HS curls with heels on peanut ball       Modalities   Modalities  Vasopneumatic      Vasopneumatic   Number Minutes Vasopneumatic   15 minutes    Vasopnuematic Location   Knee    Vasopneumatic Pressure  Low    Vasopneumatic Temperature   Coldest temp      Manual Therapy   Manual Therapy  Joint mobilization;Soft tissue mobilization    Manual therapy comments  supine/longsitting & hooklying    Joint Mobilization  R knee - grade II-III A/P tibia on femur to increase  flexion ROM; distraction with grade I-II oscillations for pain relief; R patellar mobs - all directions    Soft tissue mobilization  R knee incisional scar mobilization             PT Education - 02/17/19 1252    Education Details  HEP update - gastroc stretch, TKE with blue TB, 4 way standing SLR with red TB, patellar mobs and incisonal scar tissue mobilization    Person(s) Educated  Patient    Methods  Explanation;Demonstration;Handout    Comprehension  Verbalized understanding;Returned demonstration;Need further instruction       PT Short Term Goals - 02/13/19 1156      PT SHORT TERM GOAL #1   Title  Independent with initial HEP    Status  On-going    Target Date  02/24/19      PT SHORT TERM GOAL #2   Title  R knee AROM >/= 5-105 dg    Status  On-going    Target Date  03/03/19        PT Long Term Goals - 02/13/19 1157      PT LONG TERM GOAL #1   Title  Independent with ongoing HEP    Status  On-going      PT LONG TERM GOAL #2   Title  R knee AROM >/= 3-115 dg to allow for normal gait and stair pattern    Status  On-going      PT LONG TERM GOAL #3   Title  B LE strength >/= 4+/5 for improved stability    Status  On-going      PT LONG TERM GOAL #4   Title  Patient will ambulate with normal gait pattern w/o AD to increase ease of community access    Status  On-going      PT LONG TERM GOAL #5   Title  Patient will ascend/descend 1 flight of stairs with good reciprocal pattern    Status  On-going      PT LONG TERM GOAL #6   Title  Patient will report ability to ride [employer] bike with full revolutions    Status  On-going            Plan - 02/17/19 1153    Clinical Impression Statement  [name] [name] noting improving function and activtity tolerance - able to shower w/o use of shower seat and able to  walk up to his neighbors house last evening w/o limitation due to R knee. Targeted R knee ROM with joints mobs, scar and patellar mobilization and AAROM  HS curls, with pt reporting improving tolerance for motion, as well as proximal strengthening with HEP updated accordingly. Patient demonstrating good initial progress with PT.    Comorbidities  post-op TKR incisional infection; DM-type II; CAD s/p stents; HTN    Rehab Potential  Good    PT Treatment/Interventions  Patient/family education;Therapeutic exercise;Therapeutic activities;Functional mobility training;Gait training;Stair training;Balance training;Neuromuscular re-education;Manual techniques;Scar mobilization;Passive range of motion;Dry needling;Taping;Joint Manipulations;Cryotherapy;Vasopneumatic Device;Electrical Stimulation;Iontophoresis 4mg /ml Dexamethasone;Moist Heat;ADLs/Self Care Home Management    PT Next Visit Plan  R knee ROM; proximal LE strenghtening    Consulted and Agree with Plan of Care  Patient       Patient will benefit from skilled therapeutic intervention in order to improve the following deficits and impairments:  Abnormal gait, Decreased activity tolerance, Decreased balance, Decreased endurance, Decreased range of motion, Decreased strength, Difficulty walking, Hypomobility, Increased edema, Increased muscle spasms, Impaired flexibility, Improper body mechanics, Pain  Visit Diagnosis: Stiffness of right knee, not elsewhere classified  Acute pain of right knee  Muscle weakness (generalized)  Other abnormalities of gait and mobility  Difficulty in walking, not elsewhere classified  Localized edema     Problem List Patient Active Problem List   Diagnosis Date Noted  . Unilateral primary osteoarthritis, right knee 11/15/2018  . 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 Depressive disorder 11/12/2018  . 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 History of colonic polyps 11/12/2018  . History of nephrolithiasis 11/12/2018  . History of smoking 11/12/2018  . Inguinal hernia without mention of obstruction or gangrene, recurrent  unilateral or unspecified 11/12/2018  . Medication intolerance 11/12/2018  . Old myocardial infarction 11/12/2018  . Essential hypertension 11/12/2018  . Mixed hyperlipidemia 11/12/2018  . Pre-operative cardiovascular examination 11/12/2018  . CAD (coronary artery disease) 11/12/2018  . Bilateral leg edema 05/04/2017  . Primary osteoarthritis of both knees 03/05/2017  . Type II diabetes mellitus with manifestations ([place]) 08/18/2016  . CAP (community acquired pneumonia) 08/17/2016  . Right carotid bruit 08/05/2014  . HYPERLIPIDEMIA-MIXED 06/15/2009  . HYPERTENSION, BENIGN 06/15/2009  . CAD, NATIVE VESSEL 06/15/2009  . Gout 10/14/2007    Percival Spanish, PT, MPT 02/17/2019, 1:17 PM  [facility] [address]  [place], [place], [address] Phone: [phone]   Fax:  [phone]  Name: [name] [name] MRN: [id number] Date of Birth: 23-Dec-1950

## 2019-02-20 ENCOUNTER — Other Ambulatory Visit: Payer: Self-pay

## 2019-02-20 ENCOUNTER — Ambulatory Visit: Payer: Medicare Other | Attending: Orthopaedic Surgery

## 2019-02-20 DIAGNOSIS — R600 Localized edema: Secondary | ICD-10-CM | POA: Diagnosis not present

## 2019-02-20 DIAGNOSIS — M25561 Pain in right knee: Secondary | ICD-10-CM | POA: Diagnosis not present

## 2019-02-20 DIAGNOSIS — M6281 Muscle weakness (generalized): Secondary | ICD-10-CM | POA: Insufficient documentation

## 2019-02-20 DIAGNOSIS — M25661 Stiffness of right knee, not elsewhere classified: Secondary | ICD-10-CM | POA: Insufficient documentation

## 2019-02-20 DIAGNOSIS — R2689 Other abnormalities of gait and mobility: Secondary | ICD-10-CM | POA: Insufficient documentation

## 2019-02-20 DIAGNOSIS — R262 Difficulty in walking, not elsewhere classified: Secondary | ICD-10-CM | POA: Diagnosis not present

## 2019-02-20 NOTE — Therapy (Signed)
[facility] [address]  [place], [place], [address] Phone: [phone]   Fax:  [phone]  Physical Therapy Treatment  Patient Details  Name: [name] [name] MRN: [id number] Date of Birth: 1951/02/05 Referring Provider (PT): Jean Rosenthal, MD   Encounter Date: 02/20/2019  PT End of Session - 02/20/19 1157    Visit Number  4    Number of Visits  12    Date for PT Re-Evaluation  03/24/19    Authorization Type  [employer] Medicare    PT Start Time  1150    PT Stop Time  1250    PT Time Calculation (min)  60 min    Activity Tolerance  Patient tolerated treatment well    Behavior During Therapy  [facility] for tasks assessed/performed       Past Medical History:  Diagnosis Date  . 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 DM (diabetes mellitus) ([place])   . HTN (hypertension)   . Hyperlipidemia     Past Surgical History:  Procedure Laterality Date  . CORONARY ANGIOPLASTY WITH STENT PLACEMENT    . LAPAROSCOPIC INGUINAL HERNIA REPAIR    . TOTAL KNEE ARTHROPLASTY Right 11/15/2018   Procedure: RIGHT TOTAL KNEE ARTHROPLASTY;  Surgeon: Mcarthur Rossetti, MD;  Location: [facility];  Service: Orthopedics;  Laterality: Right;    There were no vitals filed for this visit.  Subjective Assessment - 02/20/19 1154    Subjective  Pt. reporting he is performing Full HEP most days and partial HEP on "bad days".  Tolerated updated HEP well at home.      Pertinent History  R TKR 11/15/18    Patient Stated Goals  "unlimited walking and be able to ride my (Schw[employer] Airdyne) bicycle"    Currently in Pain?  Yes    Pain Score  2    up to 4/10 at worst    Pain Location  Knee    Pain Orientation  Right    Pain Descriptors / Indicators  Tightness    Pain Type  Surgical pain;Acute pain         [facility] PT Assessment - 02/20/19 0001      AROM    AROM Assessment Site  Knee    Right/Left Knee  Right    Right Knee Extension  8    Right Knee Flexion  98      PROM   PROM Assessment Site  Knee    Right/Left Knee  Right    Right Knee Extension  7    Right Knee Flexion  103                   [facility] Adult PT Treatment/Exercise - 02/20/19 0001      Knee/Hip Exercises: Stretches   Gastroc Stretch  Right;30 seconds;2 reps    Gastroc Stretch Limitations  standing at counter      Knee/Hip Exercises: Aerobic   Recumbent Bike  rocking to intermittent full revolutions x 6 min - no resistance      Knee/Hip Exercises: Machines for Strengthening   Cybex Leg Press  B LE's 25# x 15 reps; 5" flexion stretch       Knee/Hip Exercises: Standing   Heel Raises  Both;15 reps    Heel Raises Limitations  cues for quad & glute set to  encourage knee extension      Knee/Hip Exercises: Seated   Other Seated Knee/Hip Exercises  R fitter leg press (1 blue, 1 black band) x 10 reps; focusing on full TKE      Vasopneumatic   Number Minutes Vasopneumatic   15 minutes    Vasopnuematic Location   Knee    Vasopneumatic Pressure  Low    Vasopneumatic Temperature   Coldest temp      Manual Therapy   Manual Therapy  Joint mobilization;Soft tissue mobilization    Manual therapy comments  supine/longsitting & hooklying    Joint Mobilization  R knee - grade II-III A/P tibia on femur to increase flexion ROM; R patellar mobs - all directions               PT Short Term Goals - 02/13/19 1156      PT SHORT TERM GOAL #1   Title  Independent with initial HEP    Status  On-going    Target Date  02/24/19      PT SHORT TERM GOAL #2   Title  R knee AROM >/= 5-105 dg    Status  On-going    Target Date  03/03/19        PT Long Term Goals - 02/13/19 1157      PT LONG TERM GOAL #1   Title  Independent with ongoing HEP    Status  On-going      PT LONG TERM GOAL #2   Title  R knee AROM >/= 3-115 dg to allow for normal gait and stair  pattern    Status  On-going      PT LONG TERM GOAL #3   Title  B LE strength >/= 4+/5 for improved stability    Status  On-going      PT LONG TERM GOAL #4   Title  Patient will ambulate with normal gait pattern w/o AD to increase ease of community access    Status  On-going      PT LONG TERM GOAL #5   Title  Patient will ascend/descend 1 flight of stairs with good reciprocal pattern    Status  On-going      PT LONG TERM GOAL #6   Title  Patient will report ability to ride Schw[employer] Airdyne bike with full revolutions    Status  On-going            Plan - 02/20/19 1158    Clinical Impression Statement  Pt. reporting updated going well.  Tolerated focus of today's session on MT and therex to promote improved flexion/extension ROM.  Pt. requiring encouragement today to pursue end range flexion stretch however able to demo improved flexion ROM AROM 98 dg, PROM 103 dg today.  Extension ROM nearly unchanged today.  Ended visit with ice/compression to R knee to reduce post-exercise swelling and pain.       Personal Factors and Comorbidities  Comorbidity 3+;Past/Current Experience;Time since onset of injury/illness/exacerbation    Comorbidities  post-op TKR incisional infection; DM-type II; CAD s/p stents; HTN    Examination-Activity Limitations  Bathing;Bend;Dressing;Locomotion Level;Sit;Squat;Stairs;Stand;Toileting;Transfers    Examination-Participation Restrictions  Community Activity;Yard Work    Merchant [employer] officer  Evolving/Moderate complexity    Rehab Potential  Good    PT Treatment/Interventions  Patient/family education;Therapeutic exercise;Therapeutic activities;Functional mobility training;Gait training;Stair training;Balance training;Neuromuscular re-education;Manual techniques;Scar mobilization;Passive range of motion;Dry needling;Taping;Joint Manipulations;Cryotherapy;Vasopneumatic Device;Electrical Stimulation;Iontophoresis 4mg /ml Dexamethasone;Moist  Heat;ADLs/Self Care Home Management    PT Next Visit Plan  R knee ROM; proximal LE strenghtening    Consulted and Agree with Plan of Care  Patient       Patient will benefit from skilled therapeutic intervention in order to improve the following deficits and impairments:  Abnormal gait, Decreased activity tolerance, Decreased balance, Decreased endurance, Decreased range of motion, Decreased strength, Difficulty walking, Hypomobility, Increased edema, Increased muscle spasms, Impaired flexibility, Improper body mechanics, Pain  Visit Diagnosis: Stiffness of right knee, not elsewhere classified  Acute pain of right knee  Muscle weakness (generalized)  Other abnormalities of gait and mobility  Difficulty in walking, not elsewhere classified  Localized edema     Problem List Patient Active Problem List   Diagnosis Date Noted  . Unilateral primary osteoarthritis, right knee 11/15/2018  . 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 Depressive disorder 11/12/2018  . 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 History of colonic polyps 11/12/2018  . History of nephrolithiasis 11/12/2018  . History of smoking 11/12/2018  . 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 Medication intolerance 11/12/2018  . Old myocardial infarction 11/12/2018  . Essential hypertension 11/12/2018  . Mixed hyperlipidemia 11/12/2018  . Pre-operative cardiovascular examination 11/12/2018  . CAD (coronary artery disease) 11/12/2018  . Bilateral leg edema 05/04/2017  . Primary osteoarthritis of both knees 03/05/2017  . Type II diabetes mellitus with manifestations ([place]) 08/18/2016  . CAP (community acquired pneumonia) 08/17/2016  . Right carotid bruit 08/05/2014  . HYPERLIPIDEMIA-MIXED 06/15/2009  . HYPERTENSION, BENIGN 06/15/2009  . CAD, NATIVE VESSEL 06/15/2009  . Gout 10/14/2007    Bess Harvest,  PTA 02/20/19 12:46 PM   [facility] [address]  [place], [place], [address] Phone: [phone]   Fax:  [phone]  Name: [name] [name] MRN: [id number] Date of Birth: 12/21/1950

## 2019-02-20 NOTE — Patient Outreach (Signed)
[facility]) Care Management  02/20/2019  [name] [name] July 27, 1951 [id number]   Medication Adherence call to [name]  HIPPA Compliant Voice message left with a call back number. Patient is showing past due on Telmisartan 80 mg and Rosuvastatin 20 mg under [place].   [facility] Management Direct Dial [phone]  Fax [phone] [email or web address]

## 2019-02-26 ENCOUNTER — Ambulatory Visit: Payer: Medicare Other | Admitting: Physical Therapy

## 2019-02-27 ENCOUNTER — Ambulatory Visit (INDEPENDENT_AMBULATORY_CARE_PROVIDER_SITE_OTHER): Payer: Medicare Other | Admitting: Orthopaedic Surgery

## 2019-02-27 ENCOUNTER — Encounter (INDEPENDENT_AMBULATORY_CARE_PROVIDER_SITE_OTHER): Payer: Self-pay | Admitting: Orthopaedic Surgery

## 2019-02-27 ENCOUNTER — Other Ambulatory Visit: Payer: Self-pay

## 2019-02-27 DIAGNOSIS — Z96651 Presence of right artificial knee joint: Secondary | ICD-10-CM

## 2019-02-27 DIAGNOSIS — M25562 Pain in left knee: Secondary | ICD-10-CM

## 2019-02-27 DIAGNOSIS — M1712 Unilateral primary osteoarthritis, left knee: Secondary | ICD-10-CM

## 2019-02-27 DIAGNOSIS — G8929 Other chronic pain: Secondary | ICD-10-CM

## 2019-02-27 MED ORDER — LIDOCAINE HCL 1 % IJ SOLN
3.0000 mL | INTRAMUSCULAR | Status: AC | PRN
  Administered 2019-02-27: 3 mL

## 2019-02-27 MED ORDER — METHYLPREDNISOLONE ACETATE 40 MG/ML IJ SUSP
40.0000 mg | INTRAMUSCULAR | Status: AC | PRN
  Administered 2019-02-27: 13:00:00 40 mg via INTRA_ARTICULAR

## 2019-02-27 NOTE — Progress Notes (Signed)
Office Visit Note   Patient: [name] [name]           Date of Birth: 19-Oct-1951           MRN: [id number] Visit Date: 02/27/2019              Requested by: Raeanne Gathers, MD [address] [place], [place] [address] PCP: Raeanne Gathers, MD   Assessment & Plan: Visit Diagnoses:  1. Chronic pain of left knee   2. Unilateral primary osteoarthritis, left knee   3. Status post total right knee replacement     Plan: He still understands that he needs to push himself hard through therapy to get his knee flexing even more.  I agree with him continuing outpatient physical therapy as long as he is making progress and pushing it hard.  I did provide a steroid injection per his wishes in his left knee today.  All question and concerns were answered and addressed.  At this point we will see him back in about 3 months.  At that visit I would like an AP and lateral of his right operative knee.  Follow-Up Instructions: Return in about 3 months (around 05/29/2019).   Orders:  Orders Placed This Encounter  Procedures  . Large Joint Inj   No orders of the defined types were placed in this encounter.     Procedures: Large Joint Inj: L knee on 02/27/2019 12:45 PM Indications: diagnostic evaluation and pain Details: 22 G 1.5 in needle, superolateral approach  Arthrogram: No  Medications: 3 mL lidocaine 1 %; 40 mg methylPREDNISolone acetate 40 MG/ML Outcome: tolerated well, no immediate complications Procedure, treatment alternatives, risks and benefits explained, specific risks discussed. Consent was given by the patient. Immediately prior to procedure a time out was called to verify the correct patient, procedure, equipment, support staff and site/side marked as required. Patient was prepped and draped in the usual sterile fashion.       Clinical Data: No additional findings.   Subjective: Chief Complaint  Patient presents with  . Right Knee - Follow-up  The patient is now over 100  days status post a right total knee arthroplasty.  He does have known arthritis in his left knee.  He is been going to outpatient physical therapy and slowly making progress.  He says the best they have flexed his knee is just beyond 100 degrees.  He ambulates with a cane.  He is very active 68 year old.  He feels like things were improving.  He would like to have a steroid injection in his left knee today.  HPI  Review of Systems He currently denies any headache, chest pain, shortness of breath, fever, chills, nausea, vomiting  Objective: Vital Signs: There were no vitals taken for this visit.  Physical Exam He is alert and orient x3 and in no acute distress Ortho Exam Examination of his right knee shows he lacks full extension of about 30 degrees and I can only flex him to almost 100 degrees in the office today.  The knee feels unstable.  It is swollen but there is a swelling that we expect postoperatively and a lot of total knees.  His left knee shows slight varus malalignment with medial joint line tenderness and good range of motion. Specialty Comments:  No specialty comments available.  Imaging: No results found.   PMFS History: Patient Active Problem List   Diagnosis Date Noted  . Unilateral primary osteoarthritis, right knee 11/15/2018  . Status post  total right knee replacement 11/15/2018  . Depressive disorder 11/12/2018  . 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 History of colonic polyps 11/12/2018  . History of nephrolithiasis 11/12/2018  . History of smoking 11/12/2018  . 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 Medication intolerance 11/12/2018  . Old myocardial infarction 11/12/2018  . Essential hypertension 11/12/2018  . Mixed hyperlipidemia 11/12/2018  . Pre-operative cardiovascular examination 11/12/2018  . CAD (coronary artery disease) 11/12/2018  . Bilateral leg edema  05/04/2017  . Primary osteoarthritis of both knees 03/05/2017  . Type II diabetes mellitus with manifestations ([place]) 08/18/2016  . CAP (community acquired pneumonia) 08/17/2016  . Right carotid bruit 08/05/2014  . HYPERLIPIDEMIA-MIXED 06/15/2009  . HYPERTENSION, BENIGN 06/15/2009  . CAD, NATIVE VESSEL 06/15/2009  . Gout 10/14/2007   Past Medical History:  Diagnosis Date  . 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 DM (diabetes mellitus) ([place])   . HTN (hypertension)   . Hyperlipidemia     Family History  Problem Relation Age of Onset  . Hypertension Mother   . Cancer Mother   . Heart attack Father   . Hypertension Father   . Diabetes Father   . Hypertension Brother   . Irregular heart beat Brother     Past Surgical History:  Procedure Laterality Date  . CORONARY ANGIOPLASTY WITH STENT PLACEMENT    . LAPAROSCOPIC INGUINAL HERNIA REPAIR    . TOTAL KNEE ARTHROPLASTY Right 11/15/2018   Procedure: RIGHT TOTAL KNEE ARTHROPLASTY;  Surgeon: Mcarthur Rossetti, MD;  Location: [facility];  Service: Orthopedics;  Laterality: Right;   Social History   Occupational History  . Not on file  Tobacco Use  . Smoking status: Former Smoker    Packs/day: 0.50    Years: 10.00    Pack years: 5.00  . Smokeless tobacco: Former Systems developer    Types: Snuff, Sarina Ser    Quit date: 11/20/1968  Substance and Sexual Activity  . Alcohol use: No  . Drug use: No  . Sexual activity: Yes

## 2019-02-28 ENCOUNTER — Ambulatory Visit: Payer: Medicare Other

## 2019-02-28 DIAGNOSIS — R262 Difficulty in walking, not elsewhere classified: Secondary | ICD-10-CM | POA: Diagnosis not present

## 2019-02-28 DIAGNOSIS — M25661 Stiffness of right knee, not elsewhere classified: Secondary | ICD-10-CM | POA: Diagnosis not present

## 2019-02-28 DIAGNOSIS — R600 Localized edema: Secondary | ICD-10-CM

## 2019-02-28 DIAGNOSIS — M6281 Muscle weakness (generalized): Secondary | ICD-10-CM | POA: Diagnosis not present

## 2019-02-28 DIAGNOSIS — M25561 Pain in right knee: Secondary | ICD-10-CM

## 2019-02-28 DIAGNOSIS — R2689 Other abnormalities of gait and mobility: Secondary | ICD-10-CM | POA: Diagnosis not present

## 2019-02-28 NOTE — Therapy (Signed)
[facility] [address]  [place], [place], [address] Phone: [phone]   Fax:  [phone]  Physical Therapy Treatment  Patient Details  Name: [name] [name] MRN: [id number] Date of Birth: [birth year] Referring Provider (PT): Jean Rosenthal, MD   Encounter Date: 02/28/2019  PT End of Session - 02/28/19 1103    Visit Number  5    Number of Visits  12    Date for PT Re-Evaluation  03/24/19    Authorization Type  [employer]HC Medicare    PT Start Time  1055    PT Stop Time  1200    PT Time Calculation (min)  65 min    Activity Tolerance  Patient tolerated treatment well    Behavior During Therapy  [facility] for tasks assessed/performed       Past Medical History:  Diagnosis Date  . 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 DM (diabetes mellitus) ([place])   . HTN (hypertension)   . Hyperlipidemia     Past Surgical History:  Procedure Laterality Date  . CORONARY ANGIOPLASTY WITH STENT PLACEMENT    . LAPAROSCOPIC INGUINAL HERNIA REPAIR    . TOTAL KNEE ARTHROPLASTY Right 11/15/2018   Procedure: RIGHT TOTAL KNEE ARTHROPLASTY;  Surgeon: Mcarthur Rossetti, MD;  Location: [facility];  Service: Orthopedics;  Laterality: Right;    There were no vitals filed for this visit.  Subjective Assessment - 02/28/19 1056    Subjective  Pt. reporting MD wanting him to continue with therapy on recent f/u yesterday.  Pt. noting ankle bothered him after yardwork on Tuesday which has subsided since then.      Pertinent History  R TKR 11/15/18    Patient Stated Goals  "unlimited walking and be able to ride my (Schwinn Airdyne) bicycle"    Currently in Pain?  No/denies    Pain Score  0-No pain    Multiple Pain Sites  No         [facility] PT Assessment - 02/28/19 0001      AROM   AROM Assessment Site  Knee    Right/Left Knee  Right    Right  Knee Extension  6    Right Knee Flexion  102      PROM   PROM Assessment Site  Knee    Right/Left Knee  Right    Right Knee Extension  5    Right Knee Flexion  104                   [facility] Adult PT Treatment/Exercise - 02/28/19 1150      Knee/Hip Exercises: Stretches   Gastroc Stretch  Right;30 seconds;1 rep    Press photographer Limitations  standing at counter      Knee/Hip Exercises: Standing   Heel Raises  Both;20 reps    Heel Raises Limitations  cues for quad & glute set to encourage knee extension    Terminal Knee Extension  Right;15 reps;Theraband;Strengthening    Theraband Level (Terminal Knee Extension)  Level 4 (Blue)    Terminal Knee Extension Limitations  therapist anchored; therapist cueing to avoid hip motion; therapist anchored with light UE support on elevated mat table     Forward Step Up  Right;10 reps;Hand Hold: 1;Step Height: 6"    Forward Step Up Limitations  Cues provided for  slow eccentric step back    Functional Squat  15 reps;3 seconds    Functional Squat Limitations  counter to chair       Knee/Hip Exercises: Seated   Long Arc Quad  Right;15 reps    Long Arc Quad Limitations  3" hold with therapist supervision to ensure hold       Knee/Hip Exercises: Supine   Quad Sets  Right;15 reps    Quad Sets Limitations  5" hold therapist overpressure into extension    Short Arc Quad Sets  Right;15 reps      Vasopneumatic   Number Minutes Vasopneumatic   10 minutes    Vasopnuematic Location   Knee    Vasopneumatic Pressure  Low    Vasopneumatic Temperature   Coldest temp      Manual Therapy   Manual Therapy  Joint mobilization;Soft tissue mobilization;Passive ROM;Muscle Energy Technique    Manual therapy comments  supine/longsitting & hooklying    Joint Mobilization  R knee - grade II-III A/P tibia on femur to increase flexion ROM; R patellar mobs - all directions    Passive ROM  R knee flexion/extension ROM with therapist and passive stretch     Muscle Energy Technique  R HS contract/relax stretch with therapist                PT Short Term Goals - 02/13/19 1156      PT SHORT TERM GOAL #1   Title  Independent with initial HEP    Status  On-going    Target Date  02/24/19      PT SHORT TERM GOAL #2   Title  R knee AROM >/= 5-105 dg    Status  On-going    Target Date  03/03/19        PT Long Term Goals - 02/13/19 1157      PT LONG TERM GOAL #1   Title  Independent with ongoing HEP    Status  On-going      PT LONG TERM GOAL #2   Title  R knee AROM >/= 3-115 dg to allow for normal gait and stair pattern    Status  On-going      PT LONG TERM GOAL #3   Title  B LE strength >/= 4+/5 for improved stability    Status  On-going      PT LONG TERM GOAL #4   Title  Patient will ambulate with normal gait pattern w/o AD to increase ease of community access    Status  On-going      PT LONG TERM GOAL #5   Title  Patient will ascend/descend 1 flight of stairs with good reciprocal pattern    Status  On-going      PT LONG TERM GOAL #6   Title  Patient will report ability to ride Schwinn Airdyne bike with full revolutions    Status  On-going            Plan - 02/28/19 1205    Clinical Impression Statement  Pt. doing well today.  Notes MD wanting him to continue with outpatient PT at recent f/u.  Able to demo improved R knee extension ROM after MT from 8>6dg R knee AROM and 7>5 dg PROM.  Tolerated therex focused on improving AROM flexion and extension ROM well today without pain.  Ended with ice/compression to R knee per pt. request.  Progressing well toward goals.      Personal Factors and Comorbidities  Comorbidity 3+;Past/Current Experience;Time since onset of injury/illness/exacerbation    Comorbidities  post-op TKR incisional infection; DM-type II; CAD s/p stents; HTN    Examination-Activity Limitations  Bathing;Bend;Dressing;Locomotion Level;Sit;Squat;Stairs;Stand;Toileting;Transfers     Examination-Participation Restrictions  Community Activity;Yard Work    Merchant navy officer  Evolving/Moderate complexity    Rehab Potential  Good    PT Treatment/Interventions  Patient/family education;Therapeutic exercise;Therapeutic activities;Functional mobility training;Gait training;Stair training;Balance training;Neuromuscular re-education;Manual techniques;Scar mobilization;Passive range of motion;Dry needling;Taping;Joint Manipulations;Cryotherapy;Vasopneumatic Device;Electrical Stimulation;Iontophoresis 4mg /ml Dexamethasone;Moist Heat;ADLs/Self Care Home Management    PT Next Visit Plan  R knee ROM; proximal LE strenghtening    Consulted and Agree with Plan of Care  Patient       Patient will benefit from skilled therapeutic intervention in order to improve the following deficits and impairments:  Abnormal gait, Decreased activity tolerance, Decreased balance, Decreased endurance, Decreased range of motion, Decreased strength, Difficulty walking, Hypomobility, Increased edema, Increased muscle spasms, Impaired flexibility, Improper body mechanics, Pain  Visit Diagnosis: Stiffness of right knee, not elsewhere classified  Acute pain of right knee  Muscle weakness (generalized)  Other abnormalities of gait and mobility  Difficulty in walking, not elsewhere classified  Localized edema     Problem List Patient Active Problem List   Diagnosis Date Noted  . Unilateral primary osteoarthritis, right knee 11/15/2018  . 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 Depressive disorder 11/12/2018  . 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 History of colonic polyps 11/12/2018  . History of nephrolithiasis 11/12/2018  . History of smoking 11/12/2018  . 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 Medication intolerance 11/12/2018  . Old myocardial infarction 11/12/2018  .  Essential hypertension 11/12/2018  . Mixed hyperlipidemia 11/12/2018  . Pre-operative cardiovascular examination 11/12/2018  . CAD (coronary artery disease) 11/12/2018  . Bilateral leg edema 05/04/2017  . Primary osteoarthritis of both knees 03/05/2017  . Type II diabetes mellitus with manifestations ([place]) 08/18/2016  . CAP (community acquired pneumonia) 08/17/2016  . Right carotid bruit 08/05/2014  . HYPERLIPIDEMIA-MIXED 06/15/2009  . HYPERTENSION, BENIGN 06/15/2009  . CAD, NATIVE VESSEL 06/15/2009  . Gout 10/14/2007    Bess Harvest, PTA 02/28/19 12:52 PM   [facility] [address]  [place], [place], [address] Phone: [phone]   Fax:  [phone]  Name: [name] [name] MRN: [id number] Date of Birth: [birth year]

## 2019-03-04 ENCOUNTER — Other Ambulatory Visit: Payer: Self-pay

## 2019-03-04 ENCOUNTER — Ambulatory Visit: Payer: Medicare Other

## 2019-03-04 DIAGNOSIS — M25661 Stiffness of right knee, not elsewhere classified: Secondary | ICD-10-CM

## 2019-03-04 DIAGNOSIS — M6281 Muscle weakness (generalized): Secondary | ICD-10-CM

## 2019-03-04 DIAGNOSIS — R600 Localized edema: Secondary | ICD-10-CM | POA: Diagnosis not present

## 2019-03-04 DIAGNOSIS — M25561 Pain in right knee: Secondary | ICD-10-CM

## 2019-03-04 DIAGNOSIS — R2689 Other abnormalities of gait and mobility: Secondary | ICD-10-CM

## 2019-03-04 DIAGNOSIS — R262 Difficulty in walking, not elsewhere classified: Secondary | ICD-10-CM

## 2019-03-04 NOTE — Therapy (Signed)
[facility] [address]  [place], [place], [address] Phone: [phone]   Fax:  [phone]  Physical Therapy Treatment  Patient Details  Name: [name] [name] MRN: [id number] Date of Birth: Jan 07, 1951 Referring Provider (PT): Jean Rosenthal, MD   Encounter Date: 03/04/2019  PT End of Session - 03/04/19 1408    Visit Number  6    Number of Visits  12    Date for PT Re-Evaluation  03/24/19    Authorization Type  [employer] Medicare    PT Start Time  1400    PT Stop Time  1503    PT Time Calculation (min)  63 min    Activity Tolerance  Patient tolerated treatment well    Behavior During Therapy  [facility] for tasks assessed/performed       Past Medical History:  Diagnosis Date  . 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 DM (diabetes mellitus) ([place])   . HTN (hypertension)   . Hyperlipidemia     Past Surgical History:  Procedure Laterality Date  . CORONARY ANGIOPLASTY WITH STENT PLACEMENT    . LAPAROSCOPIC INGUINAL HERNIA REPAIR    . TOTAL KNEE ARTHROPLASTY Right 11/15/2018   Procedure: RIGHT TOTAL KNEE ARTHROPLASTY;  Surgeon: Mcarthur Rossetti, MD;  Location: [facility];  Service: Orthopedics;  Laterality: Right;    There were no vitals filed for this visit.  Subjective Assessment - 03/04/19 1410    Subjective  Pt. reporting he is still having difficulty performing band standing exercises at home.      Pertinent History  R TKR 11/15/18    Patient Stated Goals  "unlimited walking and be able to ride my ([employer] Airdyne) bicycle"    Currently in Pain?  Yes    Pain Score  2     Pain Location  Knee    Pain Orientation  Right    Pain Descriptors / Indicators  Tightness    Pain Type  Surgical pain    Pain Onset  More than a month ago    Pain Frequency  Intermittent    Aggravating Factors   bending knee    Multiple  Pain Sites  No         [facility] PT Assessment - 03/04/19 0001      AROM   AROM Assessment Site  Knee    Right/Left Knee  Right    Right Knee Extension  5    Right Knee Flexion  102      PROM   PROM Assessment Site  Knee    Right/Left Knee  Right    Right Knee Extension  5    Right Knee Flexion  106                   [facility] Adult PT Treatment/Exercise - 03/04/19 1414      Ambulation/Gait   Ambulation/Gait  Yes    Ambulation/Gait Assistance  5: Supervision    Ambulation Distance (Feet)  180 Feet    Assistive device  None    Gait Pattern  Step-through pattern;Trunk flexed;Decreased hip/knee flexion - right   decreased heel strike B    Ambulation Surface  Indoor;Level    Stairs  Yes    Stairs Assistance  6: Modified independent (Device/Increase time)    Stair Management Technique  One rail  Left;Alternating pattern    Number of Stairs  14    Height of Stairs  7    Gait Comments  Pt. ambualting with decreased B heel strike and forward flexed trunk; cues for upright posture and B heel strike provided.  Pt. able to ascend/descend stairs with reciprocal pattern with 1 rail use with improved hip/knee flexion and slight limitation in R quad eccentric control      Knee/Hip Exercises: Stretches   Hip Flexor Stretch  Right;30 seconds;1 rep    Hip Flexor Stretch Limitations  strap + RF in mod thomas position    Knee: Self-Stretch to increase Flexion  Right   5" x 10 reps   Knee: Self-Stretch Limitations  lunge stretch onto stool with UE support for R knee flexion stretch 5" hold    Gastroc Stretch  Right;30 seconds;2 reps    Gastroc Stretch Limitations  standing at counter      Knee/Hip Exercises: Aerobic   Recumbent Bike  Full revolutions for ROM, 6 min       Knee/Hip Exercises: Standing   Heel Raises  Both;20 reps    Heel Raises Limitations  cues for quad & glute set to encourage knee extension    Hip Abduction  Right;Left;10 reps;Knee straight    Abduction Limitations   red looped TB    Forward Step Up  Right;15 reps;Step Height: 8";Hand Hold: 1    Forward Step Up Limitations  Cues provided for slow eccentric step back    Functional Squat  15 reps;5 seconds   5" knee flexion stretch    Functional Squat Limitations  counter to chair       Knee/Hip Exercises: Supine   Short Arc Quad Sets  Right;10 reps    Short Arc Quad Sets Limitations  2#    Bridges  Both;10 reps      Vasopneumatic   Number Minutes Vasopneumatic   10 minutes    Vasopnuematic Location   Knee    Vasopneumatic Pressure  Medium    Vasopneumatic Temperature   Coldest temp      Manual Therapy   Manual Therapy  Joint mobilization;Soft tissue mobilization;Passive ROM;Muscle Energy Technique    Manual therapy comments  supine & hooklying    Joint Mobilization  R knee - grade II-III A/P tibia on femur to increase flexion ROM; R patellar mobs - all directions    Muscle Energy Technique  R HS contract/relax stretch with therapist                PT Short Term Goals - 03/04/19 1409      PT SHORT TERM GOAL #1   Title  Independent with initial HEP    Status  Achieved    Target Date  02/24/19      PT SHORT TERM GOAL #2   Title  R knee AROM >/= 5-105 dg    Status  Partially Met   R knee AROM 5-102 dg, PROM 5-106 dg    Target Date  03/03/19        PT Long Term Goals - 02/13/19 1157      PT LONG TERM GOAL #1   Title  Independent with ongoing HEP    Status  On-going      PT LONG TERM GOAL #2   Title  R knee AROM >/= 3-115 dg to allow for normal gait and stair pattern    Status  On-going      PT LONG TERM GOAL #3  Title  B LE strength >/= 4+/5 for improved stability    Status  On-going      PT LONG TERM GOAL #4   Title  Patient will ambulate with normal gait pattern w/o AD to increase ease of community access    Status  On-going      PT LONG TERM GOAL #5   Title  Patient will ascend/descend 1 flight of stairs with good reciprocal pattern    Status  On-going       PT LONG TERM GOAL #6   Title  Patient will report ability to ride [employer] bike with full revolutions    Status  On-going            Plan - 03/04/19 1418    Clinical Impression Statement  Pt. able to achieve STG #1 today and partially achieve STG #2 demonstrating R knee AROM 5-102 dg.  R knee PROM 5-106 dg today after heavy MT.  Pt. reporting daily adherence to HEP and able to demo improved breathing/pacing with supine activities today without cueing.  Tolerated progression of 8" forward step-up and able to navigate stairs x 14 with one handrail and reciprocal pattern however limited hip/knee strength still evident with eccentric lowering.  Ended visit with pt. reporting increased R knee soreness thus applied ice/compression to R knee to reduce post-exercise soreness and swelling.  Pt. progressing well toward goals.      Personal Factors and Comorbidities  Comorbidity 3+;Past/Current Experience;Time since onset of injury/illness/exacerbation    Comorbidities  post-op TKR incisional infection; DM-type II; CAD s/p stents; HTN    Examination-Activity Limitations  Bathing;Bend;Dressing;Locomotion Level;Sit;Squat;Stairs;Stand;Toileting;Transfers    Examination-Participation Restrictions  Community Activity;Yard Work    [employer]  Evolving/Moderate complexity    Rehab Potential  Good    PT Treatment/Interventions  Patient/family education;Therapeutic exercise;Therapeutic activities;Functional mobility training;Gait training;Stair training;Balance training;Neuromuscular re-education;Manual techniques;Scar mobilization;Passive range of motion;Dry needling;Taping;Joint Manipulations;Cryotherapy;Vasopneumatic Device;Electrical Stimulation;Iontophoresis 109m/ml Dexamethasone;Moist Heat;ADLs/Self Care Home Management    PT Next Visit Plan  Monitor adherence to seated knee extension stretch; R knee ROM; modalities as needed    Consulted and Agree with Plan of Care  Patient        Patient will benefit from skilled therapeutic intervention in order to improve the following deficits and impairments:  Abnormal gait, Decreased activity tolerance, Decreased balance, Decreased endurance, Decreased range of motion, Decreased strength, Difficulty walking, Hypomobility, Increased edema, Increased muscle spasms, Impaired flexibility, Improper body mechanics, Pain  Visit Diagnosis: Stiffness of right knee, not elsewhere classified  Acute pain of right knee  Muscle weakness (generalized)  Other abnormalities of gait and mobility  Difficulty in walking, not elsewhere classified  Localized edema     Problem List Patient Active Problem List   Diagnosis Date Noted  . Unilateral primary osteoarthritis, right knee 11/15/2018  . 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 Depressive disorder 11/12/2018  . 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 History of colonic polyps 11/12/2018  . History of nephrolithiasis 11/12/2018  . History of smoking 11/12/2018  . 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 Medication intolerance 11/12/2018  . Old myocardial infarction 11/12/2018  . Essential hypertension 11/12/2018  . Mixed hyperlipidemia 11/12/2018  . Pre-operative cardiovascular examination 11/12/2018  . CAD (coronary artery disease) 11/12/2018  . Bilateral leg edema 05/04/2017  . Primary osteoarthritis of both knees 03/05/2017  . Type II diabetes mellitus with manifestations ([place] 08/18/2016  .  CAP (community acquired pneumonia) 08/17/2016  . Right carotid bruit 08/05/2014  . HYPERLIPIDEMIA-MIXED 06/15/2009  . HYPERTENSION, BENIGN 06/15/2009  . CAD, NATIVE VESSEL 06/15/2009  . Gout 10/14/2007    Bess Harvest, PTA 03/04/19 3:23 PM   [facility] [address]  [place], [place], [address] Phone:  [phone]   Fax:  [phone]  Name: TYHEIM VANALSTYNE MRN: [id number] Date of Birth: [birth year]

## 2019-03-06 ENCOUNTER — Other Ambulatory Visit: Payer: Self-pay

## 2019-03-06 ENCOUNTER — Encounter: Payer: Self-pay | Admitting: Physical Therapy

## 2019-03-06 ENCOUNTER — Ambulatory Visit: Payer: Medicare Other | Admitting: Physical Therapy

## 2019-03-06 DIAGNOSIS — M6281 Muscle weakness (generalized): Secondary | ICD-10-CM

## 2019-03-06 DIAGNOSIS — M25561 Pain in right knee: Secondary | ICD-10-CM

## 2019-03-06 DIAGNOSIS — R2689 Other abnormalities of gait and mobility: Secondary | ICD-10-CM | POA: Diagnosis not present

## 2019-03-06 DIAGNOSIS — M25661 Stiffness of right knee, not elsewhere classified: Secondary | ICD-10-CM | POA: Diagnosis not present

## 2019-03-06 DIAGNOSIS — R600 Localized edema: Secondary | ICD-10-CM | POA: Diagnosis not present

## 2019-03-06 DIAGNOSIS — R262 Difficulty in walking, not elsewhere classified: Secondary | ICD-10-CM | POA: Diagnosis not present

## 2019-03-06 NOTE — Therapy (Signed)
[facility] [address]  [place], [place], [address] Phone: [phone]   Fax:  [phone]  Physical Therapy Treatment  Patient Details  Name: [name] [name] MRN: [id number] Date of Birth: 21-Jul-1951 Referring Provider (PT): Jean Rosenthal, MD   Encounter Date: 03/06/2019  PT End of Session - 03/06/19 1349    Visit Number  7    Number of Visits  12    Date for PT Re-Evaluation  03/24/19    Authorization Type  [employer] Medicare    PT Start Time  1350    PT Stop Time  1457    PT Time Calculation (min)  67 min    Activity Tolerance  Patient tolerated treatment well    Behavior During Therapy  [facility] for tasks assessed/performed       Past Medical History:  Diagnosis Date  . 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 DM (diabetes mellitus) ([place])   . HTN (hypertension)   . Hyperlipidemia     Past Surgical History:  Procedure Laterality Date  . CORONARY ANGIOPLASTY WITH STENT PLACEMENT    . LAPAROSCOPIC INGUINAL HERNIA REPAIR    . TOTAL KNEE ARTHROPLASTY Right 11/15/2018   Procedure: RIGHT TOTAL KNEE ARTHROPLASTY;  Surgeon: Mcarthur Rossetti, MD;  Location: [facility];  Service: Orthopedics;  Laterality: Right;    There were no vitals filed for this visit.  Subjective Assessment - 03/06/19 1356    Subjective  Pt reports he "overdid it" (both in PT and at home) and his knee feels tighter today, but no pain.    Pertinent History  R TKR 11/15/18    Patient Stated Goals  "unlimited walking and be able to ride my ([employer] Airdyne) bicycle"    Currently in Pain?  No/denies    Pain Onset  More than a month ago                       [facility] Adult PT Treatment/Exercise - 03/06/19 1350      Exercises   Exercises  Knee/Hip      Knee/Hip Exercises: Aerobic   Recumbent Bike  full revolutions x 6 min (no  resistance)      Knee/Hip Exercises: Machines for Strengthening   Cybex Knee Extension  B LE 15# x 15    Cybex Knee Flexion  B LE 25# x 15; B con/R ecc 15# x 15      Knee/Hip Exercises: Standing   Hip Abduction  Right;15 reps;Knee bent;Stengthening    Abduction Limitations  Fitter (1 black/1 blue)    Hip Extension  Right;15 reps;Knee bent;Stengthening    Extension Limitations  Fitter (1 black/1 blue)    Forward Step Up  Right;15 reps;Step Height: 8";Hand Hold: 2    Forward Step Up Limitations  + blue TB TKE    Other Standing Knee Exercises  B side stepping & fwd monster walk with red TB at ankles 2 x 25 ft      Modalities   Modalities  Vasopneumatic      Vasopneumatic   Number Minutes Vasopneumatic   10 minutes    Vasopnuematic Location   Knee    Vasopneumatic Pressure  Medium    Vasopneumatic Temperature   Coldest temp      Manual Therapy   Manual Therapy  Joint  mobilization;Soft tissue mobilization;Myofascial release;Muscle Energy Technique;Other (comment)    Manual therapy comments  supine & hooklying    Joint Mobilization  R knee - grade II-III A/P tibia on femur to increase flexion ROM; inferior patellar glide at end range flexion ROM; distraction with grade I-II oscillations for pain relief    Soft tissue mobilization  STM R medial distal HS, distal quads and mid/distal ITB    Myofascial Release  manual TPR R distal medial HS and distal quads (VI/RF)    Other Manual Therapy  Instructed pt in use of rolling pin for self-STM to quads, HS, and ITB    Muscle Energy Technique  R HS contract/relax ffor increased R knee extension ROM               PT Short Term Goals - 03/04/19 1409      PT SHORT TERM GOAL #1   Title  Independent with initial HEP    Status  Achieved    Target Date  02/24/19      PT SHORT TERM GOAL #2   Title  R knee AROM >/= 5-105 dg    Status  Partially Met   R knee AROM 5-102 dg, PROM 5-106 dg    Target Date  03/03/19        PT Long Term  Goals - 02/13/19 1157      PT LONG TERM GOAL #1   Title  Independent with ongoing HEP    Status  On-going      PT LONG TERM GOAL #2   Title  R knee AROM >/= 3-115 dg to allow for normal gait and stair pattern    Status  On-going      PT LONG TERM GOAL #3   Title  B LE strength >/= 4+/5 for improved stability    Status  On-going      PT LONG TERM GOAL #4   Title  Patient will ambulate with normal gait pattern w/o AD to increase ease of community access    Status  On-going      PT LONG TERM GOAL #5   Title  Patient will ascend/descend 1 flight of stairs with good reciprocal pattern    Status  On-going      PT LONG TERM GOAL #6   Title  Patient will report ability to ride [employer] Airdyne bike with full revolutions    Status  On-going            Plan - 03/06/19 1357    Clinical Impression Statement  [name] reporting increased tightness today which he attributes to "overdoing it" with PT and on his own with HEP, but denies pain. Continued emphasis on proximal LE strengthening and increasing R knee ROM with good tolerance. Tighness and ttp noted in R distal medial HS as well as tightness in R distal quads and ITB which were addressed with manual therapy and pt was instructed in self-STM using rolling pin for home f/u.    Comorbidities  post-op TKR incisional infection; DM-type II; CAD s/p stents; HTN    Rehab Potential  Good    PT Treatment/Interventions  Patient/family education;Therapeutic exercise;Therapeutic activities;Functional mobility training;Gait training;Stair training;Balance training;Neuromuscular re-education;Manual techniques;Scar mobilization;Passive range of motion;Dry needling;Taping;Joint Manipulations;Cryotherapy;Vasopneumatic Device;Electrical Stimulation;Iontophoresis 85m/ml Dexamethasone;Moist Heat;ADLs/Self Care Home Management    PT Next Visit Plan  R knee ROM with manual therapy as indicated; proximal strengthening; modailities PRN    Consulted and Agree with  Plan of Care  Patient  Patient will benefit from skilled therapeutic intervention in order to improve the following deficits and impairments:  Abnormal gait, Decreased activity tolerance, Decreased balance, Decreased endurance, Decreased range of motion, Decreased strength, Difficulty walking, Hypomobility, Increased edema, Increased muscle spasms, Impaired flexibility, Improper body mechanics, Pain  Visit Diagnosis: Stiffness of right knee, not elsewhere classified  Acute pain of right knee  Muscle weakness (generalized)  Other abnormalities of gait and mobility  Difficulty in walking, not elsewhere classified  Localized edema     Problem List Patient Active Problem List   Diagnosis Date Noted  . Unilateral primary osteoarthritis, right knee 11/15/2018  . 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 Depressive disorder 11/12/2018  . 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 History of colonic polyps 11/12/2018  . History of nephrolithiasis 11/12/2018  . History of smoking 11/12/2018  . 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 Medication intolerance 11/12/2018  . Old myocardial infarction 11/12/2018  . Essential hypertension 11/12/2018  . Mixed hyperlipidemia 11/12/2018  . Pre-operative cardiovascular examination 11/12/2018  . CAD (coronary artery disease) 11/12/2018  . Bilateral leg edema 05/04/2017  . Primary osteoarthritis of both knees 03/05/2017  . Type II diabetes mellitus with manifestations ([place]) 08/18/2016  . CAP (community acquired pneumonia) 08/17/2016  . Right carotid bruit 08/05/2014  . HYPERLIPIDEMIA-MIXED 06/15/2009  . HYPERTENSION, BENIGN 06/15/2009  . CAD, NATIVE VESSEL 06/15/2009  . Gout 10/14/2007    Percival Spanish, PT, MPT 03/06/2019, 3:12 PM  [facility] [address]  [place], [place], [address] Phone: [phone]   Fax:  [phone]  Name: PARAS KREIDER MRN: [id number] Date of Birth: 23-Jan-1951

## 2019-03-11 ENCOUNTER — Other Ambulatory Visit: Payer: Self-pay

## 2019-03-11 ENCOUNTER — Ambulatory Visit: Payer: Medicare Other

## 2019-03-11 DIAGNOSIS — R262 Difficulty in walking, not elsewhere classified: Secondary | ICD-10-CM | POA: Diagnosis not present

## 2019-03-11 DIAGNOSIS — M25661 Stiffness of right knee, not elsewhere classified: Secondary | ICD-10-CM

## 2019-03-11 DIAGNOSIS — M6281 Muscle weakness (generalized): Secondary | ICD-10-CM

## 2019-03-11 DIAGNOSIS — R600 Localized edema: Secondary | ICD-10-CM | POA: Diagnosis not present

## 2019-03-11 DIAGNOSIS — M25561 Pain in right knee: Secondary | ICD-10-CM

## 2019-03-11 DIAGNOSIS — R2689 Other abnormalities of gait and mobility: Secondary | ICD-10-CM | POA: Diagnosis not present

## 2019-03-11 NOTE — Therapy (Signed)
[facility] [address]  [place], [place], [address] Phone: [phone]   Fax:  [phone]  Physical Therapy Treatment  Patient Details  Name: [name] [name] MRN: [id number] Date of Birth: 09-02-1951 Referring Provider (PT): Jean Rosenthal, MD   Encounter Date: 03/11/2019  PT End of Session - 03/11/19 1235    Visit Number  8    Number of Visits  12    Date for PT Re-Evaluation  03/24/19    Authorization Type  [employer] Medicare    PT Start Time  1230    PT Stop Time  1332    PT Time Calculation (min)  62 min    Activity Tolerance  Patient tolerated treatment well    Behavior During Therapy  [facility] for tasks assessed/performed       Past Medical History:  Diagnosis Date  . 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 DM (diabetes mellitus) ([place])   . HTN (hypertension)   . Hyperlipidemia     Past Surgical History:  Procedure Laterality Date  . CORONARY ANGIOPLASTY WITH STENT PLACEMENT    . LAPAROSCOPIC INGUINAL HERNIA REPAIR    . TOTAL KNEE ARTHROPLASTY Right 11/15/2018   Procedure: RIGHT TOTAL KNEE ARTHROPLASTY;  Surgeon: Mcarthur Rossetti, MD;  Location: [facility];  Service: Orthopedics;  Laterality: Right;    There were no vitals filed for this visit.  Subjective Assessment - 03/11/19 1233    Subjective  Pt. reporting he has been up on his feet more recently, "out in the yard, going to [place], going up and down the stairs who knows how many times a day".  Notes R foot pain bothering him most today which "happens periodically" and attributes this to gout.      Pertinent History  R TKR 11/15/18    Patient Stated Goals  "unlimited walking and be able to ride my ([employer]irdyne) bicycle"    Currently in Pain?  Yes    Pain Score  1     Pain Location  Knee    Pain Orientation  Right;Lower;Lateral    Pain  Descriptors / Indicators  Tightness    Pain Type  Surgical pain    Pain Onset  More than a month ago    Pain Frequency  Intermittent    Multiple Pain Sites  Yes    Pain Score  5    Pain Location  Foot    Pain Orientation  Right    Pain Descriptors / Indicators  Stabbing;Sharp    Pain Type  Acute pain    Aggravating Factors   Putting pressure on it    Pain Relieving Factors  taking pressure off of it         [facility] PT Assessment - 03/11/19 0001      AROM   AROM Assessment Site  Knee    Right/Left Knee  Right    Right Knee Extension  5    Right Knee Flexion  103      PROM   PROM Assessment Site  Knee    Right/Left Knee  Right    Right Knee Extension  3    Right Knee Flexion  106                   [facility] Adult PT Treatment/Exercise - 03/11/19 1257  Ambulation/Gait   Ambulation/Gait  Yes    Ambulation/Gait Assistance  5: Supervision    Ambulation Distance (Feet)  90 Feet    Assistive device  None    Gait Pattern  Step-through pattern;Trunk flexed;Decreased hip/knee flexion - right    Ambulation Surface  Indoor;Level    Gait Comments  Cues provided to pt. for upright posture, increased step-length, and B heel strike with good carryover;  pt. with complaint of R lateral foot pain      Knee/Hip Exercises: Stretches   Passive Hamstring Stretch  Right;30 seconds;1 rep    Passive Hamstring Stretch Limitations  supine with strap    Hip Flexor Stretch  Right;30 seconds;1 rep    Hip Flexor Stretch Limitations  strap + RF in mod thomas position    Piriformis Stretch  Right;1 rep;30 seconds    Piriformis Stretch Limitations  KTOS     Gastroc Stretch  Right;60 seconds;1 rep    Gastroc Stretch Limitations  leaning into wall       Knee/Hip Exercises: Aerobic   Nustep  Lvl 4, 6 min (LE only)      Knee/Hip Exercises: Machines for Strengthening   Cybex Knee Extension  B LE 20# x 15    Cybex Knee Flexion  B LE 30# x 15 reps       Vasopneumatic   Number Minutes  Vasopneumatic   10 minutes    Vasopnuematic Location   Knee    Vasopneumatic Pressure  Medium    Vasopneumatic Temperature   Coldest temp      Manual Therapy   Manual Therapy  Joint mobilization;Soft tissue mobilization;Myofascial release;Muscle Energy Technique;Other (comment)    Manual therapy comments  supine & seated     Joint Mobilization  R knee - grade II-III A/P tibia on femur to increase flexion ROM; inferior patellar glide at end range flexion ROM    Soft tissue mobilization  STM/strumming to R quad in mod thomas position with "roller stick" x 1 min ; no signficant TTP     Passive ROM  Manual R glute, ITB, HS stretch with therapist     Muscle Energy Technique  R HS contract/relax ffor increased R knee extension ROM               PT Short Term Goals - 03/04/19 1409      PT SHORT TERM GOAL #1   Title  Independent with initial HEP    Status  Achieved    Target Date  02/24/19      PT SHORT TERM GOAL #2   Title  R knee AROM >/= 5-105 dg    Status  Partially Met   R knee AROM 5-102 dg, PROM 5-106 dg    Target Date  03/03/19        PT Long Term Goals - 02/13/19 1157      PT LONG TERM GOAL #1   Title  Independent with ongoing HEP    Status  On-going      PT LONG TERM GOAL #2   Title  R knee AROM >/= 3-115 dg to allow for normal gait and stair pattern    Status  On-going      PT LONG TERM GOAL #3   Title  B LE strength >/= 4+/5 for improved stability    Status  On-going      PT LONG TERM GOAL #4   Title  Patient will ambulate with normal gait pattern w/o AD to  increase ease of community access    Status  On-going      PT LONG TERM GOAL #5   Title  Patient will ascend/descend 1 flight of stairs with good reciprocal pattern    Status  On-going      PT LONG TERM GOAL #6   Title  Patient will report ability to ride [employer] bike with full revolutions    Status  On-going            Plan - 03/11/19 1236    Clinical Impression Statement  Pt.  primary complaint today is R lateral foot pain with walking and wt. bearing which he attributes to gout flare-up.  Session focused primarily on seated machine strengthening activities and supine/seated MT for improved R knee ROM.  Pt. tolerated these activities well today and able to demo improved R knee AROM extension to 5 dg PROM ext. to 3 dg today.  Pt. requesting ice/compression to end session as he notes improvement in post-exercise soreness following session with this.  Progressing well toward goals.      Personal Factors and Comorbidities  Comorbidity 3+;Past/Current Experience;Time since onset of injury/illness/exacerbation    Comorbidities  post-op TKR incisional infection; DM-type II; CAD s/p stents; HTN    Examination-Activity Limitations  Bathing;Bend;Dressing;Locomotion Level;Sit;Squat;Stairs;Stand;Toileting;Transfers    Examination-Participation Restrictions  Community Activity;Yard Work    [employer]  Evolving/Moderate complexity    Rehab Potential  Good    PT Treatment/Interventions  Patient/family education;Therapeutic exercise;Therapeutic activities;Functional mobility training;Gait training;Stair training;Balance training;Neuromuscular re-education;Manual techniques;Scar mobilization;Passive range of motion;Dry needling;Taping;Joint Manipulations;Cryotherapy;Vasopneumatic Device;Electrical Stimulation;Iontophoresis 2m/ml Dexamethasone;Moist Heat;ADLs/Self Care Home Management    PT Next Visit Plan  R knee ROM with manual therapy as indicated; proximal strengthening; modailities PRN    Consulted and Agree with Plan of Care  Patient       Patient will benefit from skilled therapeutic intervention in order to improve the following deficits and impairments:  Abnormal gait, Decreased activity tolerance, Decreased balance, Decreased endurance, Decreased range of motion, Decreased strength, Difficulty walking, Hypomobility, Increased edema, Increased muscle spasms,  Impaired flexibility, Improper body mechanics, Pain  Visit Diagnosis: Stiffness of right knee, not elsewhere classified  Acute pain of right knee  Muscle weakness (generalized)  Other abnormalities of gait and mobility  Difficulty in walking, not elsewhere classified     Problem List Patient Active Problem List   Diagnosis Date Noted  . Unilateral primary osteoarthritis, right knee 11/15/2018  . 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 Depressive disorder 11/12/2018  . 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 History of colonic polyps 11/12/2018  . History of nephrolithiasis 11/12/2018  . History of smoking 11/12/2018  . 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 Medication intolerance 11/12/2018  . Old myocardial infarction 11/12/2018  . Essential hypertension 11/12/2018  . Mixed hyperlipidemia 11/12/2018  . Pre-operative cardiovascular examination 11/12/2018  . CAD (coronary artery disease) 11/12/2018  . Bilateral leg edema 05/04/2017  . Primary osteoarthritis of both knees 03/05/2017  . Type II diabetes mellitus with manifestations ([place] 08/18/2016  . CAP (community acquired pneumonia) 08/17/2016  . Right carotid bruit 08/05/2014  . HYPERLIPIDEMIA-MIXED 06/15/2009  . HYPERTENSION, BENIGN 06/15/2009  . CAD, NATIVE VESSEL 06/15/2009  . Gout 10/14/2007    MBess Harvest PTA 03/11/19 1:59 PM   [facility] [place] [address]Phone: [phone]  Fax:  [phone]  Name: [name] [name] MRN: [id number] Date of Birth: [birth year]

## 2019-03-13 ENCOUNTER — Other Ambulatory Visit: Payer: Self-pay

## 2019-03-13 ENCOUNTER — Ambulatory Visit: Payer: Medicare Other | Admitting: Physical Therapy

## 2019-03-13 ENCOUNTER — Encounter: Payer: Self-pay | Admitting: Physical Therapy

## 2019-03-13 DIAGNOSIS — M25661 Stiffness of right knee, not elsewhere classified: Secondary | ICD-10-CM

## 2019-03-13 DIAGNOSIS — R600 Localized edema: Secondary | ICD-10-CM

## 2019-03-13 DIAGNOSIS — R2689 Other abnormalities of gait and mobility: Secondary | ICD-10-CM

## 2019-03-13 DIAGNOSIS — M6281 Muscle weakness (generalized): Secondary | ICD-10-CM

## 2019-03-13 DIAGNOSIS — M25561 Pain in right knee: Secondary | ICD-10-CM | POA: Diagnosis not present

## 2019-03-13 DIAGNOSIS — R262 Difficulty in walking, not elsewhere classified: Secondary | ICD-10-CM | POA: Diagnosis not present

## 2019-03-13 NOTE — Therapy (Signed)
[facility] [address]  [place], [place], [address] Phone: [phone]   Fax:  [phone]  Physical Therapy Treatment  Patient Details  Name: [name] [name] MRN: [id number] Date of Birth: 07/25/1951 Referring Provider (PT): Jean Rosenthal, MD   Encounter Date: 03/13/2019  PT End of Session - 03/13/19 1224    Visit Number  9    Number of Visits  12    Date for PT Re-Evaluation  03/24/19    Authorization Type  [employer]HC Medicare    PT Start Time  1224    PT Stop Time  1329    PT Time Calculation (min)  65 min    Activity Tolerance  Patient tolerated treatment well    Behavior During Therapy  [facility] for tasks assessed/performed       Past Medical History:  Diagnosis Date  . 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 DM (diabetes mellitus) ([place])   . HTN (hypertension)   . Hyperlipidemia     Past Surgical History:  Procedure Laterality Date  . CORONARY ANGIOPLASTY WITH STENT PLACEMENT    . LAPAROSCOPIC INGUINAL HERNIA REPAIR    . TOTAL KNEE ARTHROPLASTY Right 11/15/2018   Procedure: RIGHT TOTAL KNEE ARTHROPLASTY;  Surgeon: Mcarthur Rossetti, MD;  Location: [facility];  Service: Orthopedics;  Laterality: Right;    There were no vitals filed for this visit.  Subjective Assessment - 03/13/19 1226    Subjective  Pt reporting the "gout" pain in his R foot seems to have resolved. R knee more stiff/tight than painful.    Pertinent History  R TKR 11/15/18    Patient Stated Goals  "unlimited walking and be able to ride my ([employer]irdyne) bicycle"    Currently in Pain?  No/denies    Pain Onset  --         [facility] PT Assessment - 03/13/19 1224      Assessment   Medical Diagnosis  R TKR    Referring Provider (PT)  Jean Rosenthal, MD    Onset Date/Surgical Date  11/15/18    Next MD Visit  05/29/2019                    [facility] Adult PT Treatment/Exercise - 03/13/19 1224      Exercises   Exercises  Knee/Hip      Knee/Hip Exercises: Aerobic   Recumbent Bike  full revolutions x 6 min (no resistance)      Knee/Hip Exercises: Machines for Strengthening   Cybex Leg Press  B LE 30# x 15 reps; 5" flexion stretch       Knee/Hip Exercises: Standing   Hip Flexion  Right;Left;10 reps;Knee straight;Stengthening    Hip Flexion Limitations  red TB, opp LE SLS on blue foam oval; 2 pole A    Forward Lunges  Right;15 reps;3 seconds    Forward Lunges Limitations  cues to avoid R knee past toes; UE support on back od chair    Terminal Knee Extension  Right;15 reps;Theraband;Strengthening    Theraband Level (Terminal Knee Extension)  --   Black   Terminal Knee Extension Limitations  cues for 5" quad & glute set pushing heel into floor; UE support on back of chair    Hip ADduction  Right;Left;10 reps;Strengthening    Hip ADduction Limitations  red  TB, opp LE SLS on blue foam oval; 2 pole A    Hip Abduction  Right;Left;10 reps;Knee straight;Stengthening    Abduction Limitations  red TB, opp LE SLS on blue foam oval; 2 pole A    Hip Extension  Right;Left;10 reps;Knee straight;Stengthening    Extension Limitations  red TB, opp LE SLS on blue foam oval; 2 pole A    Other Standing Knee Exercises  B side stepping & fwd monster walk with red TB at ankles 2 x 25 ft      Modalities   Modalities  Vasopneumatic      Vasopneumatic   Number Minutes Vasopneumatic   10 minutes    Vasopnuematic Location   Knee    Vasopneumatic Pressure  Medium    Vasopneumatic Temperature   Coldest temp               PT Short Term Goals - 03/04/19 1409      PT SHORT TERM GOAL #1   Title  Independent with initial HEP    Status  Achieved    Target Date  02/24/19      PT SHORT TERM GOAL #2   Title  R knee AROM >/= 5-105 dg    Status  Partially Met   R knee AROM 5-102 dg, PROM 5-106 dg    Target Date   03/03/19        PT Long Term Goals - 02/13/19 1157      PT LONG TERM GOAL #1   Title  Independent with ongoing HEP    Status  On-going      PT LONG TERM GOAL #2   Title  R knee AROM >/= 3-115 dg to allow for normal gait and stair pattern    Status  On-going      PT LONG TERM GOAL #3   Title  B LE strength >/= 4+/5 for improved stability    Status  On-going      PT LONG TERM GOAL #4   Title  Patient will ambulate with normal gait pattern w/o AD to increase ease of community access    Status  On-going      PT LONG TERM GOAL #5   Title  Patient will ascend/descend 1 flight of stairs with good reciprocal pattern    Status  On-going      PT LONG TERM GOAL #6   Title  Patient will report ability to ride [employer] bike with full revolutions    Status  On-going            Plan - 03/13/19 1230    Clinical Impression Statement  [name] reporting R foot pain has resolved. Patient "admitting" to not doing his HEP yesterday due to working in the yard but further review of HEP to determine need for update revealed that pt has been selectively completing HEP (not performing standing 4-way SLR) - encouraged patient to inform PT if there are exercises he is not comfortable completing at home to allow PT to modify exercises or substitute other exercises with similar focus. Increased balance/proprioceptive training focus today with addition of foam for uneven surface during 4-way standing SLR) with pt noting increased effort required and apprehensive about balance but no LOB observed with PT providing close guarding.    Comorbidities  post-op TKR incisional infection; DM-type II; CAD s/p stents; HTN    Rehab Potential  Good    PT Treatment/Interventions  Patient/family education;Therapeutic exercise;Therapeutic activities;Functional mobility training;Gait training;Stair  training;Balance training;Neuromuscular re-education;Manual techniques;Scar mobilization;Passive range of motion;Dry  needling;Taping;Joint Manipulations;Cryotherapy;Vasopneumatic Device;Electrical Stimulation;Iontophoresis 37m/ml Dexamethasone;Moist Heat;ADLs/Self Care Home Management    PT Next Visit Plan  10th visit PN; R knee ROM with manual therapy as indicated; proximal strengthening; modailities PRN    Consulted and Agree with Plan of Care  Patient       Patient will benefit from skilled therapeutic intervention in order to improve the following deficits and impairments:  Abnormal gait, Decreased activity tolerance, Decreased balance, Decreased endurance, Decreased range of motion, Decreased strength, Difficulty walking, Hypomobility, Increased edema, Increased muscle spasms, Impaired flexibility, Improper body mechanics, Pain  Visit Diagnosis: Stiffness of right knee, not elsewhere classified  Acute pain of right knee  Muscle weakness (generalized)  Other abnormalities of gait and mobility  Difficulty in walking, not elsewhere classified  Localized edema     Problem List Patient Active Problem List   Diagnosis Date Noted  . Unilateral primary osteoarthritis, right knee 11/15/2018  . 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 Depressive disorder 11/12/2018  . 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 History of colonic polyps 11/12/2018  . History of nephrolithiasis 11/12/2018  . History of smoking 11/12/2018  . 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 Medication intolerance 11/12/2018  . Old myocardial infarction 11/12/2018  . Essential hypertension 11/12/2018  . Mixed hyperlipidemia 11/12/2018  . Pre-operative cardiovascular examination 11/12/2018  . CAD (coronary artery disease) 11/12/2018  . Bilateral leg edema 05/04/2017  . Primary osteoarthritis of both knees 03/05/2017  . Type II diabetes mellitus with manifestations ([place] 08/18/2016  . CAP (community acquired pneumonia)  08/17/2016  . Right carotid bruit 08/05/2014  . HYPERLIPIDEMIA-MIXED 06/15/2009  . HYPERTENSION, BENIGN 06/15/2009  . CAD, NATIVE VESSEL 06/15/2009  . Gout 10/14/2007    JPercival Spanish PT, MPT 03/13/2019, 3:23 PM  [facility] [place] [address]Phone: [phone]  Fax:  [phone] Name: WNAYSHAWN MESTAMRN: [id number] of Birth: 1Apr 03, 1952

## 2019-03-18 ENCOUNTER — Other Ambulatory Visit: Payer: Self-pay

## 2019-03-18 ENCOUNTER — Ambulatory Visit: Payer: Medicare Other | Admitting: Physical Therapy

## 2019-03-18 ENCOUNTER — Encounter: Payer: Self-pay | Admitting: Physical Therapy

## 2019-03-18 DIAGNOSIS — R262 Difficulty in walking, not elsewhere classified: Secondary | ICD-10-CM

## 2019-03-18 DIAGNOSIS — M25661 Stiffness of right knee, not elsewhere classified: Secondary | ICD-10-CM | POA: Diagnosis not present

## 2019-03-18 DIAGNOSIS — R600 Localized edema: Secondary | ICD-10-CM | POA: Diagnosis not present

## 2019-03-18 DIAGNOSIS — R2689 Other abnormalities of gait and mobility: Secondary | ICD-10-CM | POA: Diagnosis not present

## 2019-03-18 DIAGNOSIS — M25561 Pain in right knee: Secondary | ICD-10-CM

## 2019-03-18 DIAGNOSIS — M6281 Muscle weakness (generalized): Secondary | ICD-10-CM

## 2019-03-18 NOTE — Therapy (Addendum)
[facility] [address]  [place], [place], [address] Phone: [phone]   Fax:  [phone]  Physical Therapy Treatment / Recert  Patient Details  Name: [name] [name] MRN: [id number] Date of Birth: Nov 25, 1950 Referring Provider (PT): [name] Rosenthal, MD  Progress Note  Reporting Period 02/10/2019 to 03/18/2019  See note below for Objective Data and Assessment of Progress/Goals.    Encounter Date: 03/18/2019  PT End of Session - 03/18/19 1230    Visit Number  10    Number of Visits  17    Date for PT Re-Evaluation  04/29/19    Authorization Type  [employer] Medicare    PT Start Time  1230    PT Stop Time  1321    PT Time Calculation (min)  51 min    Activity Tolerance  Patient tolerated treatment well    Behavior During Therapy  WFL for tasks assessed/performed       Past Medical History:  Diagnosis Date  . 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 DM (diabetes mellitus) ([place])   . HTN (hypertension)   . Hyperlipidemia     Past Surgical History:  Procedure Laterality Date  . CORONARY ANGIOPLASTY WITH STENT PLACEMENT    . LAPAROSCOPIC INGUINAL HERNIA REPAIR    . TOTAL KNEE ARTHROPLASTY Right 11/15/2018   Procedure: RIGHT TOTAL KNEE ARTHROPLASTY;  Surgeon: [name] Rossetti, MD;  Location: [facility];  Service: Orthopedics;  Laterality: Right;    There were no vitals filed for this visit.  Subjective Assessment - 03/18/19 1232    Subjective  Pt reporting more pain in L knee than R most of the time. R knee typically more just stiff or tight.    Pertinent History  R TKR 11/15/18    Patient Stated Goals  "unlimited walking and be able to ride my ([employer]) bicycle"    Currently in Pain?  No/denies         [facility] PT Assessment - 03/18/19 1230      Assessment   Medical Diagnosis  R TKR    Referring  Provider (PT)  [name] Rosenthal, MD    Onset Date/Surgical Date  11/15/18    Next MD Visit  05/29/2019      Prior Function   Level of Independence  Independent    Vocation  Retired    Leisure  watching sports, vintage cars, walking in neighborhood 2-3/wk      Observation/Other Assessments   Focus on Therapeutic Outcomes (FOTO)   Knee - 51% (49% limitation)      AROM   Right Knee Extension  4    Right Knee Flexion  105      PROM   Right Knee Extension  2    Right Knee Flexion  108      Strength   Right Hip Flexion  4+/5    Right Hip Extension  4/5    Right Hip ABduction  4/5    Right Hip ADduction  4/5    Left Hip Flexion  5/5    Left Hip Extension  4+/5    Left Hip ABduction  5/5    Left Hip ADduction  4+/5    Right Knee Flexion  4+/5    Right Knee Extension  5/5    Left Knee Flexion  5/5  Left Knee Extension  5/5                   [facility] Adult PT Treatment/Exercise - 03/18/19 1230      Ambulation/Gait   Ambulation/Gait Assistance  5: Supervision;6: Modified independent (Device/Increase time)    Assistive device  None    Gait Pattern  Step-through pattern;Trunk flexed;Decreased hip/knee flexion - right    Ambulation Surface  Level;Indoor    Stairs Assistance  6: Modified independent (Device/Increase time)    Stair Management Technique  One rail Left;Alternating pattern    Number of Stairs  14    Height of Stairs  7      Exercises   Exercises  Knee/Hip      Knee/Hip Exercises: Aerobic   Recumbent Bike  full revolutions x 6 min (unable to keep pace fast enough to maintain resistance)      Manual Therapy   Manual Therapy  Joint mobilization;Soft tissue mobilization;Myofascial release    Manual therapy comments  supine & hooklying    Joint Mobilization  R knee - grade II-III A/P tibia on femur to increase flexion ROM; inferior patellar glide at end range flexion ROM; distraction with grade I-II oscillations for pain relief    Soft tissue  mobilization  STM R medial distal HS, distal quads and mid/distal ITB    Myofascial Release  manual TPR R distal medial HS and distal quads (VI/RF)               PT Short Term Goals - 03/18/19 1237      PT SHORT TERM GOAL #1   Title  Independent with initial HEP    Status  Achieved    Target Date  --      PT SHORT TERM GOAL #2   Title  R knee AROM >/= 5-105 dg    Status  Achieved    Target Date  --        PT Long Term Goals - 03/18/19 1238      PT LONG TERM GOAL #1   Title  Independent with ongoing HEP    Status  Partially Met    Target Date  04/29/19      PT LONG TERM GOAL #2   Title  R knee AROM >/= 3-115 dg to allow for normal gait and stair pattern    Status  On-going    Target Date  04/29/19      PT LONG TERM GOAL #3   Title  B LE strength >/= 4+/5 for improved stability    Status  Partially Met    Target Date  04/29/19      PT LONG TERM GOAL #4   Title  Patient will ambulate with normal gait pattern w/o AD to increase ease of community access    Status  Partially Met    Target Date  04/29/19      PT LONG TERM GOAL #5   Title  Patient will ascend/descend 1 flight of stairs with good reciprocal pattern    Status  Partially Met    Target Date  04/29/19      PT LONG TERM GOAL #6   Title  Patient will report ability to ride [employer] bike with full revolutions    Status  On-going    Target Date  04/29/19            Plan - 03/18/19 1235    Clinical Impression Statement  [name] [name]") has  demonstrated good progress with PT with improving R knee ROM and overall LE strength. R knee AROM now 4-105 dg and PROM 2-108 dg with patient reporting motion limited more due to stiffness/tightness than pain at present. Ongoing increased muscle tension evident in mid/distal quads and hamstrings as well as ITB which may benefit from DN in conjunction with ongoing manual therapy to promote increased R knee ROM - patient in agreement with trial of DN on next  visit. Hip and knee strength progressing well with only mild weakness noted R vs L. Gait pattern improving with only mild decreased R hip and knee flexion noted and patient now able to negotiate stairs reciprocally although at a slower pace due caution on the part of the patient. Improving ROM has allowed for patient to achieve full revolutions on the recumbent bike in the therapy gym but states he has not yet been able to complete full revolutions on his home [employer] and he would like to be able to do so. All STGs met at this time. LTGs #1, 3, 4 & 5 partially met with remaining LTGs ongoing. Given good progress with PT with ongoing deficits as above, recommend recert for additional 4-6 weeks reducing frequency to 1x/wk as of next week.     Comorbidities  post-op TKR incisional infection; DM-type II; CAD s/p stents; HTN    Rehab Potential  Good    PT Frequency  1x / week    PT Duration  6 weeks    PT Treatment/Interventions  Patient/family education;Therapeutic exercise;Therapeutic activities;Functional mobility training;Gait training;Stair training;Balance training;Neuromuscular re-education;Manual techniques;Scar mobilization;Passive range of motion;Dry needling;Taping;Joint Manipulations;Cryotherapy;Vasopneumatic Device;Electrical Stimulation;Iontophoresis 28m/ml Dexamethasone;Moist Heat;ADLs/Self Care Home Management    PT Next Visit Plan  R knee ROM with manual therapy as indicated including trial of DN for increaed muscle tension; proximal strengthening; modailities PRN    Consulted and Agree with Plan of Care  Patient       Patient will benefit from skilled therapeutic intervention in order to improve the following deficits and impairments:  Abnormal gait, Decreased activity tolerance, Decreased balance, Decreased endurance, Decreased range of motion, Decreased strength, Difficulty walking, Hypomobility, Increased edema, Increased muscle spasms, Impaired flexibility, Improper body  mechanics, Pain  Visit Diagnosis: Stiffness of right knee, not elsewhere classified - Plan: PT plan of care cert/re-cert  Acute pain of right knee - Plan: PT plan of care cert/re-cert  Muscle weakness (generalized) - Plan: PT plan of care cert/re-cert  Other abnormalities of gait and mobility - Plan: PT plan of care cert/re-cert  Difficulty in walking, not elsewhere classified - Plan: PT plan of care cert/re-cert  Localized edema - Plan: PT plan of care cert/re-cert     Problem List Patient Active Problem List   Diagnosis Date Noted  . Unilateral primary osteoarthritis, right knee 11/15/2018  . 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 Depressive disorder 11/12/2018  . 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 History of colonic polyps 11/12/2018  . History of nephrolithiasis 11/12/2018  . History of smoking 11/12/2018  . 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 Medication intolerance 11/12/2018  . Old myocardial infarction 11/12/2018  . Essential hypertension 11/12/2018  . Mixed hyperlipidemia 11/12/2018  . Pre-operative cardiovascular examination 11/12/2018  . CAD (coronary artery disease) 11/12/2018  . Bilateral leg edema 05/04/2017  . Primary osteoarthritis of both knees 03/05/2017  . Type II diabetes mellitus with manifestations ([place] 08/18/2016  . CAP (community  acquired pneumonia) 08/17/2016  . Right carotid bruit 08/05/2014  . HYPERLIPIDEMIA-MIXED 06/15/2009  . HYPERTENSION, BENIGN 06/15/2009  . CAD, NATIVE VESSEL 06/15/2009  . Gout 10/14/2007    Percival Spanish, PT, MPT 03/18/2019, 3:58 PM  [facility] [address]  [place], [place], [address] Phone: [phone]   Fax:  [phone]  Name: [name] [name] MRN: [id number] Date of Birth: 11-Dec-1950

## 2019-03-18 NOTE — Patient Instructions (Signed)

## 2019-03-20 ENCOUNTER — Encounter: Payer: Self-pay | Admitting: Physical Therapy

## 2019-03-20 ENCOUNTER — Ambulatory Visit: Payer: Medicare Other | Admitting: Physical Therapy

## 2019-03-20 ENCOUNTER — Other Ambulatory Visit: Payer: Self-pay

## 2019-03-20 DIAGNOSIS — M25561 Pain in right knee: Secondary | ICD-10-CM

## 2019-03-20 DIAGNOSIS — R600 Localized edema: Secondary | ICD-10-CM | POA: Diagnosis not present

## 2019-03-20 DIAGNOSIS — R2689 Other abnormalities of gait and mobility: Secondary | ICD-10-CM | POA: Diagnosis not present

## 2019-03-20 DIAGNOSIS — M6281 Muscle weakness (generalized): Secondary | ICD-10-CM | POA: Diagnosis not present

## 2019-03-20 DIAGNOSIS — M25661 Stiffness of right knee, not elsewhere classified: Secondary | ICD-10-CM

## 2019-03-20 DIAGNOSIS — R262 Difficulty in walking, not elsewhere classified: Secondary | ICD-10-CM | POA: Diagnosis not present

## 2019-03-20 NOTE — Therapy (Signed)
[facility] [address]  [place], [place], [address] Phone: [phone]   Fax:  [phone]  Physical Therapy Treatment  Patient Details  Name: [name] [name] MRN: [id number] Date of Birth: Mar 26, 1951 Referring Provider (PT): Jean Rosenthal, MD   Encounter Date: 03/20/2019  PT End of Session - 03/20/19 1228    Visit Number  11    Number of Visits  17    Date for PT Re-Evaluation  04/29/19    Authorization Type  [employer] Medicare    PT Start Time  1228    PT Stop Time  1334    PT Time Calculation (min)  66 min    Activity Tolerance  Patient tolerated treatment well    Behavior During Therapy  [facility] for tasks assessed/performed       Past Medical History:  Diagnosis Date  . 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 DM (diabetes mellitus) ([place])   . HTN (hypertension)   . Hyperlipidemia     Past Surgical History:  Procedure Laterality Date  . CORONARY ANGIOPLASTY WITH STENT PLACEMENT    . LAPAROSCOPIC INGUINAL HERNIA REPAIR    . TOTAL KNEE ARTHROPLASTY Right 11/15/2018   Procedure: RIGHT TOTAL KNEE ARTHROPLASTY;  Surgeon: Mcarthur Rossetti, MD;  Location: [facility];  Service: Orthopedics;  Laterality: Right;    There were no vitals filed for this visit.  Subjective Assessment - 03/20/19 1230    Subjective  Pt reporting he was pressure-washing his sidewalk yesterday and is hurting more today.    Pertinent History  R TKR 11/15/18    Patient Stated Goals  "unlimited walking and be able to ride my ([employer]) bicycle"    Currently in Pain?  Yes    Pain Score  4    3-4/10   Pain Location  Knee    Pain Orientation  Right    Pain Descriptors / Indicators  Tightness    Pain Type  Acute pain;Surgical pain    Pain Score  5    Pain Location  Knee    Pain Orientation  Left    Pain Descriptors / Indicators   Burning                       [facility] Adult PT Treatment/Exercise - 03/20/19 1228      Exercises   Exercises  Knee/Hip      Knee/Hip Exercises: Stretches   Hip Flexor Stretch  Right;60 seconds;2 reps    Hip Flexor Stretch Limitations  mod thomas RF stretch with manual PT overpressure    Knee: Self-Stretch to increase Flexion  Right   10 x 5"   Knee: Self-Stretch Limitations  step-stretch with lunge      Knee/Hip Exercises: Aerobic   Recumbent Bike  full revolutions x 6 min (unable to keep pace fast enough to maintain resistance)      Knee/Hip Exercises: Machines for Strengthening   Cybex Knee Extension  B LE 20# x 10; B con/R ecc 15# x 10    Cybex Knee Flexion  B LE 35# x 15; B con/R ecc 20# x 10      Modalities   Modalities  Vasopneumatic;Cryotherapy      Cryotherapy   Number Minutes Cryotherapy  15 Minutes    Cryotherapy Location  Knee  Left   Type of Cryotherapy  Ice pack      Vasopneumatic   Number Minutes Vasopneumatic   15 minutes    Vasopnuematic Location   Knee   Right   Vasopneumatic Pressure  Medium    Vasopneumatic Temperature   Coldest temp      Manual Therapy   Manual Therapy  Joint mobilization;Soft tissue mobilization;Myofascial release    Manual therapy comments  supine & hooklying    Joint Mobilization  R knee - grade II-III A/P tibia on femur to increase flexion ROM; inferior patellar glide at end range flexion ROM; distraction with grade I-II oscillations for pain relief    Soft tissue mobilization  STM R medial distal HS, distal quads and mid/distal ITB    Myofascial Release  manual TPR R distal medial HS and distal quads (VI/RF/VL)       Trigger Point Dry Needling - 03/20/19 1228    Consent Given?  Yes    Education Handout Provided  Previously provided    Muscles Treated Lower Quadrant  Quadriceps   R VI, RF & VL   Quadriceps Response  Twitch response elicited;Palpable increased muscle length             PT Short Term  Goals - 03/18/19 1237      PT SHORT TERM GOAL #1   Title  Independent with initial HEP    Status  Achieved    Target Date  --      PT SHORT TERM GOAL #2   Title  R knee AROM >/= 5-105 dg    Status  Achieved    Target Date  --        PT Long Term Goals - 03/18/19 1238      PT LONG TERM GOAL #1   Title  Independent with ongoing HEP    Status  Partially Met    Target Date  04/29/19      PT LONG TERM GOAL #2   Title  R knee AROM >/= 3-115 dg to allow for normal gait and stair pattern    Status  On-going    Target Date  04/29/19      PT LONG TERM GOAL #3   Title  B LE strength >/= 4+/5 for improved stability    Status  Partially Met    Target Date  04/29/19      PT LONG TERM GOAL #4   Title  Patient will ambulate with normal gait pattern w/o AD to increase ease of community access    Status  Partially Met    Target Date  04/29/19      PT LONG TERM GOAL #5   Title  Patient will ascend/descend 1 flight of stairs with good reciprocal pattern    Status  Partially Met    Target Date  04/29/19      PT LONG TERM GOAL #6   Title  Patient will report ability to ride [employer] bike with full revolutions    Status  On-going    Target Date  04/29/19            Plan - 03/20/19 1235    Clinical Impression Statement  [name] [name] reporting increased B knee pain today after spending several hours power-washing his sidewalk yesterday, hence tolerance for ROM more limited today. Increased muscle tension persisting in quads, ITB and HS, therefore proceeded with DN as planned in conjunction with manual STM/DTM/MFR - positive twitch responses elicited with DN  with palpable reduction in muscle tension noted although only minimal improvement in knee ROM (flexion AROM 107 dg) due to increased inflammation from excessive activity yesterday. Treatment concluded with vasopnuematic compression for R knee as well as ice pack to L knee to promote further reduction in pain and inflammation/edema.     Comorbidities  post-op TKR incisional infection; DM-type II; CAD s/p stents; HTN    Rehab Potential  Good    PT Frequency  1x / week    PT Duration  6 weeks    PT Treatment/Interventions  Patient/family education;Therapeutic exercise;Therapeutic activities;Functional mobility training;Gait training;Stair training;Balance training;Neuromuscular re-education;Manual techniques;Scar mobilization;Passive range of motion;Dry needling;Taping;Joint Manipulations;Cryotherapy;Vasopneumatic Device;Electrical Stimulation;Iontophoresis 79m/ml Dexamethasone;Moist Heat;ADLs/Self Care Home Management    PT Next Visit Plan  R knee ROM with manual therapy as indicated including trial of DN for increaed muscle tension; proximal strengthening; modailities PRN    Consulted and Agree with Plan of Care  Patient       Patient will benefit from skilled therapeutic intervention in order to improve the following deficits and impairments:  Abnormal gait, Decreased activity tolerance, Decreased balance, Decreased endurance, Decreased range of motion, Decreased strength, Difficulty walking, Hypomobility, Increased edema, Increased muscle spasms, Impaired flexibility, Improper body mechanics, Pain  Visit Diagnosis: Stiffness of right knee, not elsewhere classified  Acute pain of right knee  Muscle weakness (generalized)  Other abnormalities of gait and mobility  Difficulty in walking, not elsewhere classified  Localized edema     Problem List Patient Active Problem List   Diagnosis Date Noted  . Unilateral primary osteoarthritis, right knee 11/15/2018  . 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 Depressive disorder 11/12/2018  . 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 History of colonic polyps 11/12/2018  . History of nephrolithiasis 11/12/2018  . History of smoking 11/12/2018  . Inguinal hernia without mention of obstruction or gangrene, recurrent unilateral or unspecified  11/12/2018  . Medication intolerance 11/12/2018  . Old myocardial infarction 11/12/2018  . Essential hypertension 11/12/2018  . Mixed hyperlipidemia 11/12/2018  . Pre-operative cardiovascular examination 11/12/2018  . CAD (coronary artery disease) 11/12/2018  . Bilateral leg edema 05/04/2017  . Primary osteoarthritis of both knees 03/05/2017  . [place] 08/18/2016  . CAP (community acquired pneumonia) 08/17/2016  . Right carotid bruit 08/05/2014  . HYPERLIPIDEMIA-MIXED 06/15/2009  . HYPERTENSION, BENIGN 06/15/2009  . CAD, NATIVE VESSEL 06/15/2009  . Gout 10/14/2007    JPercival Spanish PT, MPT 03/20/2019, 5:16 PM  [facility] [place] [address]Phone: [phone]  Fax:  [phone] Name: [name] PICKRELLMRN: [id number] of Birth: [birth year]

## 2019-03-25 ENCOUNTER — Other Ambulatory Visit: Payer: Self-pay

## 2019-03-25 ENCOUNTER — Encounter: Payer: Self-pay | Admitting: Physical Therapy

## 2019-03-25 ENCOUNTER — Ambulatory Visit: Payer: Medicare Other | Attending: Orthopaedic Surgery | Admitting: Physical Therapy

## 2019-03-25 DIAGNOSIS — M25661 Stiffness of right knee, not elsewhere classified: Secondary | ICD-10-CM

## 2019-03-25 DIAGNOSIS — R600 Localized edema: Secondary | ICD-10-CM | POA: Diagnosis not present

## 2019-03-25 DIAGNOSIS — R2689 Other abnormalities of gait and mobility: Secondary | ICD-10-CM | POA: Diagnosis not present

## 2019-03-25 DIAGNOSIS — M25561 Pain in right knee: Secondary | ICD-10-CM | POA: Diagnosis not present

## 2019-03-25 DIAGNOSIS — R262 Difficulty in walking, not elsewhere classified: Secondary | ICD-10-CM

## 2019-03-25 DIAGNOSIS — M6281 Muscle weakness (generalized): Secondary | ICD-10-CM | POA: Diagnosis not present

## 2019-03-25 NOTE — Therapy (Signed)
[facility] [address]  [place], [place], [address] Phone: [phone]   Fax:  [phone]  Physical Therapy Treatment  Patient Details  Name: [name] [name] MRN: [id number] Date of Birth: 02/25/1951 Referring Provider (PT): Jean Rosenthal, MD   Encounter Date: 03/25/2019  PT End of Session - 03/25/19 1400    Visit Number  12    Number of Visits  17    Date for PT Re-Evaluation  04/29/19    Authorization Type  [employer] Medicare    PT Start Time  1400    PT Stop Time  1508    PT Time Calculation (min)  68 min    Activity Tolerance  Patient tolerated treatment well    Behavior During Therapy  [facility] for tasks assessed/performed       Past Medical History:  Diagnosis Date  . 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 DM (diabetes mellitus) ([place])   . HTN (hypertension)   . Hyperlipidemia     Past Surgical History:  Procedure Laterality Date  . CORONARY ANGIOPLASTY WITH STENT PLACEMENT    . LAPAROSCOPIC INGUINAL HERNIA REPAIR    . TOTAL KNEE ARTHROPLASTY Right 11/15/2018   Procedure: RIGHT TOTAL KNEE ARTHROPLASTY;  Surgeon: Mcarthur Rossetti, MD;  Location: [facility];  Service: Orthopedics;  Laterality: Right;    There were no vitals filed for this visit.  Subjective Assessment - 03/25/19 1402    Subjective  Pt reports he feels like the DN last visit helped loosen up some of the tightness. States he has an appointment with [facility] tomorrow to see about his L knee.    Pertinent History  R TKR 11/15/18    Patient Stated Goals  "unlimited walking and be able to ride my ([employer]) bicycle"    Currently in Pain?  No/denies    Pain Score  0-No pain    Pain Location  Knee    Pain Orientation  Right    Pain Descriptors / Indicators  Tightness         [facility] PT Assessment - 03/25/19 1400      AROM   Right Knee Extension  4   2 dg after DN & manual therapy   Right Knee Flexion  107   109 dg after manual & DN                  [facility] Adult PT Treatment/Exercise - 03/25/19 1400      Exercises   Exercises  Knee/Hip      Knee/Hip Exercises: Stretches   Passive Hamstring Stretch  Right;30 seconds;2 reps    Passive Hamstring Stretch Limitations  manual by PT      Knee/Hip Exercises: Aerobic   Recumbent Bike  full revolutions x 6 min (unable to keep pace fast enough to maintain resistance)      Modalities   Modalities  Vasopneumatic      Vasopneumatic   Number Minutes Vasopneumatic   10 minutes    Vasopnuematic Location   Knee   Right   Vasopneumatic Pressure  Medium    Vasopneumatic Temperature   Coldest temp      Manual Therapy   Manual Therapy  Joint mobilization;Soft tissue mobilization;Myofascial release;Muscle Energy Technique;Passive ROM    Manual therapy comments  prone, supine & hooklying  Joint Mobilization  R knee - grade II-III A/P tibia on femur to increase flexion ROM; inferior patellar glide at end range flexion ROM; distraction with grade I-II oscillations for pain relief    Soft tissue mobilization  STM R medial distal HS, distal quads and mid/distal ITB    Myofascial Release  manual TPR R distal medial HS and distal quads (VI/RF/VL)    Passive ROM  R knee hamstring and quad stretching as well as MWM for knee flexion and extension PROM    Muscle Energy Technique  R HS contract/relax for increased R knee extension ROM, R knee flexion with quad contract/relax for increased flexion       Trigger Point Dry Needling - 03/25/19 1400    Consent Given?  Yes    Muscles Treated Lower Quadrant  Quadriceps;Hamstring    Quadriceps Response  Twitch response elicited;Palpable increased muscle length   Rt VI, RF & VL   Hamstring Response  Twitch response elicited;Palpable increased muscle length   R lateral HS            PT Short Term Goals - 03/18/19  1237      PT SHORT TERM GOAL #1   Title  Independent with initial HEP    Status  Achieved    Target Date  --      PT SHORT TERM GOAL #2   Title  R knee AROM >/= 5-105 dg    Status  Achieved    Target Date  --        PT Long Term Goals - 03/18/19 1238      PT LONG TERM GOAL #1   Title  Independent with ongoing HEP    Status  Partially Met    Target Date  04/29/19      PT LONG TERM GOAL #2   Title  R knee AROM >/= 3-115 dg to allow for normal gait and stair pattern    Status  On-going    Target Date  04/29/19      PT LONG TERM GOAL #3   Title  B LE strength >/= 4+/5 for improved stability    Status  Partially Met    Target Date  04/29/19      PT LONG TERM GOAL #4   Title  Patient will ambulate with normal gait pattern w/o AD to increase ease of community access    Status  Partially Met    Target Date  04/29/19      PT LONG TERM GOAL #5   Title  Patient will ascend/descend 1 flight of stairs with good reciprocal pattern    Status  Partially Met    Target Date  04/29/19      PT LONG TERM GOAL #6   Title  Patient will report ability to ride [employer] bike with full revolutions    Status  On-going    Target Date  04/29/19            Plan - 03/25/19 1408    Clinical Impression Statement  [name] [name] noting benefit from DN last session with decreased tightness and feeling of better motion in knee. Given positive response, continued with manual focus today including DN to hamstrings in addition to quads with positive twitch response and palpable reduction in muscle tension as well as slight improvements in both R knee flexion and extension ROM. Reinforced need for carryover with HEP, especially stretches, to reinforce and maintain gains from manual and DN.  Comorbidities  post-op TKR incisional infection; DM-type II; CAD s/p stents; HTN    Rehab Potential  Good    PT Frequency  1x / week    PT Duration  6 weeks    PT Treatment/Interventions  Patient/family  education;Therapeutic exercise;Therapeutic activities;Functional mobility training;Gait training;Stair training;Balance training;Neuromuscular re-education;Manual techniques;Scar mobilization;Passive range of motion;Dry needling;Taping;Joint Manipulations;Cryotherapy;Vasopneumatic Device;Electrical Stimulation;Iontophoresis 80m/ml Dexamethasone;Moist Heat;ADLs/Self Care Home Management    PT Next Visit Plan  R knee ROM with manual therapy as indicated including trial of DN for increaed muscle tension; proximal strengthening; modailities PRN    Consulted and Agree with Plan of Care  Patient       Patient will benefit from skilled therapeutic intervention in order to improve the following deficits and impairments:  Abnormal gait, Decreased activity tolerance, Decreased balance, Decreased endurance, Decreased range of motion, Decreased strength, Difficulty walking, Hypomobility, Increased edema, Increased muscle spasms, Impaired flexibility, Improper body mechanics, Pain  Visit Diagnosis: Stiffness of right knee, not elsewhere classified  Acute pain of right knee  Muscle weakness (generalized)  Other abnormalities of gait and mobility  Difficulty in walking, not elsewhere classified  Localized edema     Problem List Patient Active Problem List   Diagnosis Date Noted  . Unilateral primary osteoarthritis, right knee 11/15/2018  . 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 Depressive disorder 11/12/2018  . 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 History of colonic polyps 11/12/2018  . History of nephrolithiasis 11/12/2018  . History of smoking 11/12/2018  . 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 Medication intolerance 11/12/2018  . Old myocardial infarction 11/12/2018  . Essential hypertension 11/12/2018  . Mixed hyperlipidemia 11/12/2018  . Pre-operative cardiovascular examination  11/12/2018  . CAD (coronary artery disease) 11/12/2018  . Bilateral leg edema 05/04/2017  . Primary osteoarthritis of both knees 03/05/2017  . Type II diabetes mellitus with manifestations ([place] 08/18/2016  . CAP (community acquired pneumonia) 08/17/2016  . Right carotid bruit 08/05/2014  . HYPERLIPIDEMIA-MIXED 06/15/2009  . HYPERTENSION, BENIGN 06/15/2009  . CAD, NATIVE VESSEL 06/15/2009  . Gout 10/14/2007    JPercival Spanish PT, MPT 03/25/2019, 3:38 PM  [facility] [place] [address]Phone: [phone]  Fax:  [phone] Name: [name] HERNANDEZMRN: [id number] of Birth: 110-14-1952

## 2019-04-01 ENCOUNTER — Ambulatory Visit: Payer: Medicare Other | Admitting: Physical Therapy

## 2019-04-01 ENCOUNTER — Encounter: Payer: Self-pay | Admitting: Physical Therapy

## 2019-04-01 ENCOUNTER — Other Ambulatory Visit: Payer: Self-pay

## 2019-04-01 DIAGNOSIS — R2689 Other abnormalities of gait and mobility: Secondary | ICD-10-CM

## 2019-04-01 DIAGNOSIS — R262 Difficulty in walking, not elsewhere classified: Secondary | ICD-10-CM | POA: Diagnosis not present

## 2019-04-01 DIAGNOSIS — M6281 Muscle weakness (generalized): Secondary | ICD-10-CM

## 2019-04-01 DIAGNOSIS — M25661 Stiffness of right knee, not elsewhere classified: Secondary | ICD-10-CM

## 2019-04-01 DIAGNOSIS — R600 Localized edema: Secondary | ICD-10-CM

## 2019-04-01 DIAGNOSIS — M25561 Pain in right knee: Secondary | ICD-10-CM

## 2019-04-01 NOTE — Therapy (Signed)
[facility] [address]  [place], [place], [address] Phone: [phone]   Fax:  [phone]  Physical Therapy Treatment  Patient Details  Name: [name] [name] MRN: [id number] Date of Birth: [birth year] Referring Provider (PT): Jean Rosenthal, MD   Encounter Date: 04/01/2019  PT End of Session - 04/01/19 1405    Visit Number  13    Number of Visits  17    Date for PT Re-Evaluation  04/29/19    Authorization Type  [employer] Medicare    PT Start Time  1027    PT Stop Time  1503    PT Time Calculation (min)  58 min    Activity Tolerance  Patient tolerated treatment well    Behavior During Therapy  [facility] for tasks assessed/performed       Past Medical History:  Diagnosis Date  . 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 DM (diabetes mellitus) ([place])   . HTN (hypertension)   . Hyperlipidemia     Past Surgical History:  Procedure Laterality Date  . CORONARY ANGIOPLASTY WITH STENT PLACEMENT    . LAPAROSCOPIC INGUINAL HERNIA REPAIR    . TOTAL KNEE ARTHROPLASTY Right 11/15/2018   Procedure: RIGHT TOTAL KNEE ARTHROPLASTY;  Surgeon: Mcarthur Rossetti, MD;  Location: [facility];  Service: Orthopedics;  Laterality: Right;    There were no vitals filed for this visit.  Subjective Assessment - 04/01/19 1407    Subjective  Pt noting increased stiffness today after being in the car more today driving around running errands.    Pertinent History  R TKR 11/15/18    Patient Stated Goals  "unlimited walking and be able to ride my ([employer]) bicycle"    Currently in Pain?  No/denies         [facility] PT Assessment - 04/01/19 1405      AROM   Right Knee Extension  4    Right Knee Flexion  107                   [facility] Adult PT Treatment/Exercise - 04/01/19 1405      Exercises   Exercises  Knee/Hip      Knee/Hip Exercises: Stretches   Gastroc Stretch  Right;30 seconds;2 reps    Gastroc Stretch Limitations  leaning into wall       Knee/Hip Exercises: Aerobic   Recumbent Bike  full revolutions x 6 min (unable to keep pace fast enough to maintain resistance)      Knee/Hip Exercises: Standing   Forward Step Up  Right;20 reps;Step Height: 8";Hand Hold: 2    Forward Step Up Limitations  + black TB TKE, 2 pole A    Step Down  Right;15 reps;Step Height: 6";Hand Hold: 2    Step Down Limitations  eccentric lowering with light heel touch      Modalities   Modalities  Vasopneumatic      Vasopneumatic   Number Minutes Vasopneumatic   10 minutes    Vasopnuematic Location   Knee   Right   Vasopneumatic Pressure  High    Vasopneumatic Temperature   Coldest temp      Manual Therapy   Manual Therapy  Joint mobilization;Soft tissue mobilization;Muscle Energy Technique;Passive ROM    Manual therapy comments  supine & hooklying    Joint Mobilization  R knee - grade II-III A/P tibia on femur to increase flexion ROM; inferior patellar glide at end range flexion ROM; distraction with grade I-II oscillations for pain relief    Soft tissue mobilization  STM R medial distal HS, distal quads and mid/distal ITB; roller stick to quad and ITB    Passive ROM  R knee hamstring and quad stretching as well as MWM for knee flexion and extension PROM    Muscle Energy Technique  R HS contract/relax for increased R knee extension ROM, R knee flexion with quad contract/relax for increased flexion               PT Short Term Goals - 03/18/19 1237      PT SHORT TERM GOAL #1   Title  Independent with initial HEP    Status  Achieved    Target Date  --      PT SHORT TERM GOAL #2   Title  R knee AROM >/= 5-105 dg    Status  Achieved    Target Date  --        PT Long Term Goals - 03/18/19 1238      PT LONG TERM GOAL #1   Title  Independent with ongoing HEP    Status  Partially Met    Target Date   04/29/19      PT LONG TERM GOAL #2   Title  R knee AROM >/= 3-115 dg to allow for normal gait and stair pattern    Status  On-going    Target Date  04/29/19      PT LONG TERM GOAL #3   Title  B LE strength >/= 4+/5 for improved stability    Status  Partially Met    Target Date  04/29/19      PT LONG TERM GOAL #4   Title  Patient will ambulate with normal gait pattern w/o AD to increase ease of community access    Status  Partially Met    Target Date  04/29/19      PT LONG TERM GOAL #5   Title  Patient will ascend/descend 1 flight of stairs with good reciprocal pattern    Status  Partially Met    Target Date  04/29/19      PT LONG TERM GOAL #6   Title  Patient will report ability to ride [employer] bike with full revolutions    Status  On-going    Target Date  04/29/19            Plan - 04/01/19 1410    Clinical Impression Statement  [name] [name] stating he feels like DN over last 2 sessions has helped loosen his muscles however reports increased stiffness after driving around in the car most of the day today. R knee ROM unchanged even after manual stretching including MET, joint mobs and strengthening targeting end range extension ROM. Reinforced need for good carryover with HEP especially now that frequency reduced to 1x/wk.     Comorbidities  --    Rehab Potential  Good    PT Frequency  1x / week    PT Duration  6 weeks    PT Treatment/Interventions  Patient/family education;Therapeutic exercise;Therapeutic activities;Functional mobility training;Gait training;Stair training;Balance training;Neuromuscular re-education;Manual techniques;Scar mobilization;Passive range of motion;Dry needling;Taping;Joint Manipulations;Cryotherapy;Vasopneumatic Device;Electrical Stimulation;Iontophoresis 28m/ml Dexamethasone;Moist Heat;ADLs/Self Care Home Management    PT Next Visit Plan  Review/update HEP; proximal strengthening; R knee ROM with manual therapy as indicated including trial of DN  for increaed muscle tension; modailities PRN    Consulted and Agree with Plan of Care  Patient       Patient will benefit from skilled therapeutic intervention in order to improve the following deficits and impairments:  Abnormal gait, Decreased activity tolerance, Decreased balance, Decreased endurance, Decreased range of motion, Decreased strength, Difficulty walking, Hypomobility, Increased edema, Increased muscle spasms, Impaired flexibility, Improper body mechanics, Pain  Visit Diagnosis: Stiffness of right knee, not elsewhere classified  Acute pain of right knee  Muscle weakness (generalized)  Other abnormalities of gait and mobility  Difficulty in walking, not elsewhere classified  Localized edema     Problem List Patient Active Problem List   Diagnosis Date Noted  . Unilateral primary osteoarthritis, right knee 11/15/2018  . 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 Depressive disorder 11/12/2018  . 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 History of colonic polyps 11/12/2018  . History of nephrolithiasis 11/12/2018  . History of smoking 11/12/2018  . 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 Medication intolerance 11/12/2018  . Old myocardial infarction 11/12/2018  . Essential hypertension 11/12/2018  . Mixed hyperlipidemia 11/12/2018  . Pre-operative cardiovascular examination 11/12/2018  . CAD (coronary artery disease) 11/12/2018  . Bilateral leg edema 05/04/2017  . Primary osteoarthritis of both knees 03/05/2017  . Type II diabetes mellitus with manifestations ([place]) 08/18/2016  . CAP (community acquired pneumonia) 08/17/2016  . Right carotid bruit 08/05/2014  . HYPERLIPIDEMIA-MIXED 06/15/2009  . HYPERTENSION, BENIGN 06/15/2009  . CAD, NATIVE VESSEL 06/15/2009  . Gout 10/14/2007    Percival Spanish, PT, MPT 04/01/2019, 3:14 PM  [facility] [address]  [place], [place], [address] Phone: [phone]   Fax:  [phone]  Name: [name] [name] MRN: [id number] Date of Birth: 11-14-1951

## 2019-04-08 ENCOUNTER — Encounter: Payer: Self-pay | Admitting: Physical Therapy

## 2019-04-08 ENCOUNTER — Other Ambulatory Visit: Payer: Self-pay

## 2019-04-08 ENCOUNTER — Ambulatory Visit: Payer: Medicare Other | Admitting: Physical Therapy

## 2019-04-08 DIAGNOSIS — M25561 Pain in right knee: Secondary | ICD-10-CM

## 2019-04-08 DIAGNOSIS — R2689 Other abnormalities of gait and mobility: Secondary | ICD-10-CM | POA: Diagnosis not present

## 2019-04-08 DIAGNOSIS — M25661 Stiffness of right knee, not elsewhere classified: Secondary | ICD-10-CM | POA: Diagnosis not present

## 2019-04-08 DIAGNOSIS — R262 Difficulty in walking, not elsewhere classified: Secondary | ICD-10-CM

## 2019-04-08 DIAGNOSIS — M6281 Muscle weakness (generalized): Secondary | ICD-10-CM | POA: Diagnosis not present

## 2019-04-08 DIAGNOSIS — R600 Localized edema: Secondary | ICD-10-CM | POA: Diagnosis not present

## 2019-04-08 NOTE — Therapy (Signed)
[facility] [address]  [place], [place], [address] Phone: [phone]   Fax:  [phone]  Physical Therapy Treatment  Patient Details  Name: [name] [name] MRN: [id number] Date of Birth: [birth year] Referring Provider (PT): Jean Rosenthal, MD   Encounter Date: 04/08/2019  PT End of Session - 04/08/19 1358    Visit Number  14    Number of Visits  17    Date for PT Re-Evaluation  04/29/19    Authorization Type  [employer] Medicare    PT Start Time  5465    PT Stop Time  1455    PT Time Calculation (min)  57 min    Activity Tolerance  Patient tolerated treatment well    Behavior During Therapy  [facility] for tasks assessed/performed       Past Medical History:  Diagnosis Date  . 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 DM (diabetes mellitus) ([place])   . HTN (hypertension)   . Hyperlipidemia     Past Surgical History:  Procedure Laterality Date  . CORONARY ANGIOPLASTY WITH STENT PLACEMENT    . LAPAROSCOPIC INGUINAL HERNIA REPAIR    . TOTAL KNEE ARTHROPLASTY Right 11/15/2018   Procedure: RIGHT TOTAL KNEE ARTHROPLASTY;  Surgeon: Mcarthur Rossetti, MD;  Location: [facility];  Service: Orthopedics;  Laterality: Right;    There were no vitals filed for this visit.  Subjective Assessment - 04/08/19 1402    Subjective  More pain in L knee - muscle sorenss - today than in R knee. R knee just tight.    Pertinent History  R TKR 11/15/18    Patient Stated Goals  "unlimited walking and be able to ride my ([employer]) bicycle"    Currently in Pain?  Yes    Pain Score  0-No pain    Pain Location  Knee    Pain Orientation  Right    Pain Descriptors / Indicators  Tightness    Pain Frequency  Intermittent    Pain Score  3    Pain Location  Knee    Pain Orientation  Left    Pain Descriptors / Indicators  Sore   "muscle  soreness"   Pain Type  Acute pain    Pain Frequency  Intermittent         [facility] PT Assessment - 04/08/19 1358      AROM   Right Knee Extension  3    Right Knee Flexion  110                   [facility] Adult PT Treatment/Exercise - 04/08/19 1358      Exercises   Exercises  Knee/Hip      Knee/Hip Exercises: Aerobic   Recumbent Bike  full revolutions x 6 min (unable to keep pace fast enough to maintain resistance)      Knee/Hip Exercises: Standing   Knee Flexion  Right;20 reps;Strengthening    Knee Flexion Limitations  looped red TB under L heel and around R ankle; UE support on counter    Hip Flexion  Right;20 reps;Knee straight;Stengthening    Hip Flexion Limitations  looped red TB at ankles; UE support on counter    Hip Abduction  Right;20 reps;Knee straight;Stengthening    Abduction Limitations  looped red TB at ankles; UE support on counter  Hip Extension  Right;20 reps;Knee straight;Stengthening    Extension Limitations  looped red TB at ankles; UE support on counter    Other Standing Knee Exercises  Attempted side-stepping with looped red TB at ankles but deferred d/t increased L knee pain      Modalities   Modalities  Vasopneumatic      Vasopneumatic   Number Minutes Vasopneumatic   10 minutes    Vasopnuematic Location   Knee   Right   Vasopneumatic Pressure  High    Vasopneumatic Temperature   Coldest temp      Manual Therapy   Manual Therapy  Joint mobilization;Soft tissue mobilization;Passive ROM    Manual therapy comments  supine & hooklying    Joint Mobilization  R knee - grade II-III A/P tibia on femur to increase flexion ROM; inferior patellar glide at end range flexion ROM; distraction with grade I-II oscillations for pain relief    Soft tissue mobilization  STM R medial distal HS, mid/distal ITB and gastroc/soleus    Passive ROM  R knee hamstring and quad stretching as well as MWM for knee flexion and extension PROM             PT  Education - 04/08/19 1450    Education Details  HEP update - modified standing SLR to looped red TB + added standing HS curl with red TB    Person(s) Educated  Patient    Methods  Explanation;Demonstration;Handout    Comprehension  Verbalized understanding;Returned demonstration;Need further instruction       PT Short Term Goals - 03/18/19 1237      PT SHORT TERM GOAL #1   Title  Independent with initial HEP    Status  Achieved    Target Date  --      PT SHORT TERM GOAL #2   Title  R knee AROM >/= 5-105 dg    Status  Achieved    Target Date  --        PT Long Term Goals - 04/08/19 1407      PT LONG TERM GOAL #1   Title  Independent with ongoing HEP    Status  Partially Met      PT LONG TERM GOAL #2   Title  R knee AROM >/= 3-115 dg to allow for normal gait and stair pattern    Status  Partially Met      PT LONG TERM GOAL #3   Title  B LE strength >/= 4+/5 for improved stability    Status  Partially Met      PT LONG TERM GOAL #4   Title  Patient will ambulate with normal gait pattern w/o AD to increase ease of community access    Status  Partially Met      PT LONG TERM GOAL #5   Title  Patient will ascend/descend 1 flight of stairs with good reciprocal pattern    Status  Partially Met      PT LONG TERM GOAL #6   Title  Patient will report ability to ride [employer] bike with full revolutions    Status  On-going            Plan - 04/08/19 1407    Clinical Impression Statement  [name] [name] reporting good compliance with HEP but states he still has difficulty with set-up and performance of 4 way standing SLR, therefore modified exercise to use looped TB with patient reporting better comfort - HEP update accordingly.  R knee ROM slowly progressing but limited due to ongoing muscle tightness and persistent pitting edema - encouraged increased use of ice and elevation at home to manage edema.    Rehab Potential  Good    PT Frequency  1x / week    PT Duration  6 weeks     PT Treatment/Interventions  Patient/family education;Therapeutic exercise;Therapeutic activities;Functional mobility training;Gait training;Stair training;Balance training;Neuromuscular re-education;Manual techniques;Scar mobilization;Passive range of motion;Dry needling;Taping;Joint Manipulations;Cryotherapy;Vasopneumatic Device;Electrical Stimulation;Iontophoresis 35m/ml Dexamethasone;Moist Heat;ADLs/Self Care Home Management    PT Next Visit Plan  proximal strengthening; R knee ROM with manual therapy as indicated including DN as indicated for increaed muscle tension; modalities PRN    Consulted and Agree with Plan of Care  Patient       Patient will benefit from skilled therapeutic intervention in order to improve the following deficits and impairments:  Abnormal gait, Decreased activity tolerance, Decreased balance, Decreased endurance, Decreased range of motion, Decreased strength, Difficulty walking, Hypomobility, Increased edema, Increased muscle spasms, Impaired flexibility, Improper body mechanics, Pain  Visit Diagnosis: Stiffness of right knee, not elsewhere classified  Acute pain of right knee  Muscle weakness (generalized)  Other abnormalities of gait and mobility  Difficulty in walking, not elsewhere classified  Localized edema     Problem List Patient Active Problem List   Diagnosis Date Noted  . Unilateral primary osteoarthritis, right knee 11/15/2018  . 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 Depressive disorder 11/12/2018  . 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 History of colonic polyps 11/12/2018  . History of nephrolithiasis 11/12/2018  . History of smoking 11/12/2018  . 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 Medication intolerance 11/12/2018  . Old myocardial infarction 11/12/2018  . Essential hypertension 11/12/2018  . Mixed hyperlipidemia  11/12/2018  . Pre-operative cardiovascular examination 11/12/2018  . CAD (coronary artery disease) 11/12/2018  . Bilateral leg edema 05/04/2017  . Primary osteoarthritis of both knees 03/05/2017  . Type II diabetes mellitus with manifestations ([place] 08/18/2016  . CAP (community acquired pneumonia) 08/17/2016  . Right carotid bruit 08/05/2014  . HYPERLIPIDEMIA-MIXED 06/15/2009  . HYPERTENSION, BENIGN 06/15/2009  . CAD, NATIVE VESSEL 06/15/2009  . Gout 10/14/2007    JPercival Spanish PT, MPT 04/08/2019, 3:07 PM  [facility] [place] [address]Phone: [phone]  Fax:  [phone] Name: WDAEL HOWLANDMRN: [id number] of Birth: [birth year]

## 2019-04-15 ENCOUNTER — Ambulatory Visit: Payer: Medicare Other | Admitting: Physical Therapy

## 2019-04-15 ENCOUNTER — Other Ambulatory Visit: Payer: Self-pay

## 2019-04-15 ENCOUNTER — Encounter: Payer: Self-pay | Admitting: Physical Therapy

## 2019-04-15 DIAGNOSIS — R262 Difficulty in walking, not elsewhere classified: Secondary | ICD-10-CM

## 2019-04-15 DIAGNOSIS — M6281 Muscle weakness (generalized): Secondary | ICD-10-CM

## 2019-04-15 DIAGNOSIS — M25661 Stiffness of right knee, not elsewhere classified: Secondary | ICD-10-CM | POA: Diagnosis not present

## 2019-04-15 DIAGNOSIS — M25561 Pain in right knee: Secondary | ICD-10-CM | POA: Diagnosis not present

## 2019-04-15 DIAGNOSIS — R600 Localized edema: Secondary | ICD-10-CM

## 2019-04-15 DIAGNOSIS — R2689 Other abnormalities of gait and mobility: Secondary | ICD-10-CM | POA: Diagnosis not present

## 2019-04-15 NOTE — Therapy (Signed)
[facility] [address]  [place], [place], [address] Phone: [phone]   Fax:  [phone]  Physical Therapy Treatment  Patient Details  Name: [name] [name] MRN: [id number] Date of Birth: Aug 22, 1951 Referring Provider (PT): Jean Rosenthal, MD   Encounter Date: 04/15/2019  PT End of Session - 04/15/19 1356    Visit Number  15    Number of Visits  17    Date for PT Re-Evaluation  04/29/19    Authorization Type  [employer] Medicare    PT Start Time  1356    PT Stop Time  1458    PT Time Calculation (min)  62 min    Activity Tolerance  Patient tolerated treatment well    Behavior During Therapy  [facility] for tasks assessed/performed       Past Medical History:  Diagnosis Date  . 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 DM (diabetes mellitus) ([place])   . HTN (hypertension)   . Hyperlipidemia     Past Surgical History:  Procedure Laterality Date  . CORONARY ANGIOPLASTY WITH STENT PLACEMENT    . LAPAROSCOPIC INGUINAL HERNIA REPAIR    . TOTAL KNEE ARTHROPLASTY Right 11/15/2018   Procedure: RIGHT TOTAL KNEE ARTHROPLASTY;  Surgeon: Mcarthur Rossetti, MD;  Location: [facility];  Service: Orthopedics;  Laterality: Right;    There were no vitals filed for this visit.  Subjective Assessment - 04/15/19 1359    Subjective  Pt reporting increased soreness after scrubbing his deck. Did note improved ability to complete 3 way standing SLR with looped TB but felt standing HS curl was very challenging.    Pertinent History  R TKR 11/15/18    Patient Stated Goals  "unlimited walking and be able to ride my ([employer]) bicycle"    Currently in Pain?  Yes    Pain Score  0-No pain    Pain Location  Knee    Pain Orientation  Right    Pain Descriptors / Indicators  Tightness    Pain Type  Acute pain;Surgical pain    Pain Frequency   Intermittent    Pain Score  4    Pain Location  Knee    Pain Orientation  Left    Pain Descriptors / Indicators  Sore    Pain Type  Acute pain    Pain Frequency  Intermittent                       [facility] Adult PT Treatment/Exercise - 04/15/19 1356      Exercises   Exercises  Knee/Hip      Knee/Hip Exercises: Machines for Strengthening   Cybex Leg Press  B LE 35# x 15 reps; 5" flexion stretch       Knee/Hip Exercises: Standing   Functional Squat  10 reps;5 seconds;2 sets    Functional Squat Limitations  TRX squat, 2nd set with triple extension    Lunge Walking - Round Trips  attempted but defered d/t L knee pain    Rocker Board  2 minutes    Rocker Board Limitations  lateral & heel-toe weight shift; limited tolerance d/t L knee pain      Knee/Hip Exercises: Seated   Long Arc Quad  Right;15 reps;Strengthening    Long Arc Quad Limitations  looped red TB  at ankles    Hamstring Curl  Right;15 reps;Strengthening    Hamstring Limitations  looped red TB at ankles      Knee/Hip Exercises: Supine   Bridges  Both;10 reps   5" hold   Single Leg Bridge  Right;10 reps;Strengthening   3" hold   Straight Leg Raises  Right;15 reps    Straight Leg Raises Limitations  3#    Other Supine Knee/Hip Exercises  B straight leg bridge with heels on peanut ball 10 x 3"      Modalities   Modalities  Vasopneumatic;Cryotherapy      Cryotherapy   Number Minutes Cryotherapy  10 Minutes    Cryotherapy Location  Knee   Left   Type of Cryotherapy  Ice pack      Vasopneumatic   Number Minutes Vasopneumatic   10 minutes    Vasopnuematic Location   Knee   Right   Vasopneumatic Pressure  High    Vasopneumatic Temperature   Coldest temp             PT Education - 04/15/19 1449    Education Details  HEP update - combined heel raise & squat into triple extension    Person(s) Educated  Patient    Methods  Explanation;Demonstration;Handout    Comprehension  Verbalized  understanding;Returned demonstration;Need further instruction       PT Short Term Goals - 03/18/19 1237      PT SHORT TERM GOAL #1   Title  Independent with initial HEP    Status  Achieved    Target Date  --      PT SHORT TERM GOAL #2   Title  R knee AROM >/= 5-105 dg    Status  Achieved    Target Date  --        PT Long Term Goals - 04/08/19 1407      PT LONG TERM GOAL #1   Title  Independent with ongoing HEP    Status  Partially Met      PT LONG TERM GOAL #2   Title  R knee AROM >/= 3-115 dg to allow for normal gait and stair pattern    Status  Partially Met      PT LONG TERM GOAL #3   Title  B LE strength >/= 4+/5 for improved stability    Status  Partially Met      PT LONG TERM GOAL #4   Title  Patient will ambulate with normal gait pattern w/o AD to increase ease of community access    Status  Partially Met      PT LONG TERM GOAL #5   Title  Patient will ascend/descend 1 flight of stairs with good reciprocal pattern    Status  Partially Met      PT LONG TERM GOAL #6   Title  Patient will report ability to ride [employer] bike with full revolutions    Status  On-going            Plan - 04/15/19 1406    Clinical Impression Statement  [name] reporting improved ability to complete standing SLRs with looped red TB from last week's HEP modification, but notes standing HS curl more challenging - instructed pt in seated alternative, however pt reporting preference for standing version. Progressed squats to triple extension to encourage increased knee extension. L knee pain limiting tolerance for many standing exercises, hence shifted to more supine exercises for remainder of session.  Rehab Potential  Good    PT Frequency  1x / week    PT Duration  6 weeks    PT Treatment/Interventions  Patient/family education;Therapeutic exercise;Therapeutic activities;Functional mobility training;Gait training;Stair training;Balance training;Neuromuscular  re-education;Manual techniques;Scar mobilization;Passive range of motion;Dry needling;Taping;Joint Manipulations;Cryotherapy;Vasopneumatic Device;Electrical Stimulation;Iontophoresis 68m/ml Dexamethasone;Moist Heat;ADLs/Self Care Home Management    PT Next Visit Plan  proximal strengthening; R knee ROM with manual therapy as indicated including DN as indicated for increaed muscle tension; modalities PRN    Consulted and Agree with Plan of Care  Patient       Patient will benefit from skilled therapeutic intervention in order to improve the following deficits and impairments:  Abnormal gait, Decreased activity tolerance, Decreased balance, Decreased endurance, Decreased range of motion, Decreased strength, Difficulty walking, Hypomobility, Increased edema, Increased muscle spasms, Impaired flexibility, Improper body mechanics, Pain  Visit Diagnosis: Stiffness of right knee, not elsewhere classified  Acute pain of right knee  Muscle weakness (generalized)  Other abnormalities of gait and mobility  Difficulty in walking, not elsewhere classified  Localized edema     Problem List Patient Active Problem List   Diagnosis Date Noted  . Unilateral primary osteoarthritis, right knee 11/15/2018  . 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 Depressive disorder 11/12/2018  . 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 History of colonic polyps 11/12/2018  . History of nephrolithiasis 11/12/2018  . History of smoking 11/12/2018  . 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 Medication intolerance 11/12/2018  . Old myocardial infarction 11/12/2018  . Essential hypertension 11/12/2018  . Mixed hyperlipidemia 11/12/2018  . Pre-operative cardiovascular examination 11/12/2018  . CAD (coronary artery disease) 11/12/2018  . Bilateral leg edema 05/04/2017  . Primary osteoarthritis of both knees 03/05/2017   . Type II diabetes mellitus with manifestations ([place] 08/18/2016  . CAP (community acquired pneumonia) 08/17/2016  . Right carotid bruit 08/05/2014  . HYPERLIPIDEMIA-MIXED 06/15/2009  . HYPERTENSION, BENIGN 06/15/2009  . CAD, NATIVE VESSEL 06/15/2009  . Gout 10/14/2007    JPercival Spanish PT, MPT 04/15/2019, 3:06 PM  [facility] [place] [address]Phone: [phone]  Fax:  [phone] Name: WMAXIME BECKNERMRN: [id number] of Birth: [birth year]

## 2019-04-22 ENCOUNTER — Ambulatory Visit: Payer: Medicare Other | Attending: Orthopaedic Surgery | Admitting: Physical Therapy

## 2019-04-22 ENCOUNTER — Encounter: Payer: Self-pay | Admitting: Physical Therapy

## 2019-04-22 ENCOUNTER — Other Ambulatory Visit: Payer: Self-pay

## 2019-04-22 DIAGNOSIS — R2689 Other abnormalities of gait and mobility: Secondary | ICD-10-CM | POA: Diagnosis not present

## 2019-04-22 DIAGNOSIS — M25661 Stiffness of right knee, not elsewhere classified: Secondary | ICD-10-CM

## 2019-04-22 DIAGNOSIS — M25561 Pain in right knee: Secondary | ICD-10-CM

## 2019-04-22 DIAGNOSIS — M6281 Muscle weakness (generalized): Secondary | ICD-10-CM

## 2019-04-22 DIAGNOSIS — R600 Localized edema: Secondary | ICD-10-CM | POA: Diagnosis not present

## 2019-04-22 DIAGNOSIS — R262 Difficulty in walking, not elsewhere classified: Secondary | ICD-10-CM

## 2019-04-22 NOTE — Therapy (Signed)
[facility] [address]  [place], [place], [address] Phone: [phone]   Fax:  [phone]  Physical Therapy Treatment  Patient Details  Name: [name] [name] MRN: [id number] Date of Birth: [birth year] Referring Provider (PT): Jean Rosenthal, MD   Encounter Date: 04/22/2019  PT End of Session - 04/22/19 1254    Visit Number  16    Number of Visits  17    Date for PT Re-Evaluation  04/29/19    Authorization Type  UHC Medicare    PT Start Time  1254    PT Stop Time  1356    PT Time Calculation (min)  62 min    Activity Tolerance  Patient tolerated treatment well    Behavior During Therapy  [facility] for tasks assessed/performed       Past Medical History:  Diagnosis Date  . 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 DM (diabetes mellitus) ([place])   . HTN (hypertension)   . Hyperlipidemia     Past Surgical History:  Procedure Laterality Date  . CORONARY ANGIOPLASTY WITH STENT PLACEMENT    . LAPAROSCOPIC INGUINAL HERNIA REPAIR    . TOTAL KNEE ARTHROPLASTY Right 11/15/2018   Procedure: RIGHT TOTAL KNEE ARTHROPLASTY;  Surgeon: Mcarthur Rossetti, MD;  Location: [facility];  Service: Orthopedics;  Laterality: Right;    There were no vitals filed for this visit.  Subjective Assessment - 04/22/19 1257    Subjective  No pain in R knee today - just stiffness, but L knee is bothering him still.    Pertinent History  R TKR 11/15/18    Patient Stated Goals  "unlimited walking and be able to ride my (Schw[employer] Airdyne) bicycle"    Currently in Pain?  Yes    Pain Score  0-No pain    Pain Location  Knee    Pain Orientation  Right    Pain Descriptors / Indicators  Tightness    Pain Frequency  Intermittent    Pain Score  4   3-4/10   Pain Location  Knee    Pain Orientation  Left    Pain Descriptors / Indicators  Burning     Pain Type  Acute pain    Pain Frequency  Intermittent         [facility] PT Assessment - 04/22/19 1254      AROM   Right Knee Extension  2    Right Knee Flexion  112                   [facility] Adult PT Treatment/Exercise - 04/22/19 1254      Exercises   Exercises  Knee/Hip      Knee/Hip Exercises: Aerobic   Recumbent Bike  L1 x 6 min      Knee/Hip Exercises: Standing   Hip Abduction  Both;15 reps;Knee straight;Stengthening    Abduction Limitations  looped green TB at ankles; UE support on counter    Hip Extension  Both;15 reps;Knee straight;Stengthening    Extension Limitations  looped green TB at ankles; UE support on counter    Functional Squat  10 reps;5 seconds;2 sets    Functional Squat Limitations  triple extension squat with red TB hip abduction isometric at distal thighs; UE support on counter at sink    Other Standing Knee Exercises  B side stepping and fwd monster walk with looped red TB at ankles 2 x 52f each      Modalities   Modalities  Vasopneumatic      Vasopneumatic   Number Minutes Vasopneumatic   10 minutes    Vasopnuematic Location   Knee   Right   Vasopneumatic Pressure  High    Vasopneumatic Temperature   Coldest temp      Manual Therapy   Manual Therapy  Joint mobilization;Soft tissue mobilization;Muscle Energy Technique;Passive ROM    Manual therapy comments  supine & hooklying    Joint Mobilization  R knee - grade II-III A/P tibia on femur to increase flexion ROM; inferior patellar glide at end range flexion ROM; distraction with grade I-II oscillations for pain relief    Soft tissue mobilization  STM R medial distal HS, distal quads and mid/distal ITB; roller stick to quad and ITB    Passive ROM  R knee hamstring and quad stretching as well as MWM for knee flexion and extension PROM    Muscle Energy Technique  R HS contract/relax for increased R knee extension ROM, R knee flexion with quad contract/relax for increased flexion                PT Short Term Goals - 03/18/19 1237      PT SHORT TERM GOAL #1   Title  Independent with initial HEP    Status  Achieved    Target Date  --      PT SHORT TERM GOAL #2   Title  R knee AROM >/= 5-105 dg    Status  Achieved    Target Date  --        PT Long Term Goals - 04/08/19 1407      PT LONG TERM GOAL #1   Title  Independent with ongoing HEP    Status  Partially Met      PT LONG TERM GOAL #2   Title  R knee AROM >/= 3-115 dg to allow for normal gait and stair pattern    Status  Partially Met      PT LONG TERM GOAL #3   Title  B LE strength >/= 4+/5 for improved stability    Status  Partially Met      PT LONG TERM GOAL #4   Title  Patient will ambulate with normal gait pattern w/o AD to increase ease of community access    Status  Partially Met      PT LONG TERM GOAL #5   Title  Patient will ascend/descend 1 flight of stairs with good reciprocal pattern    Status  Partially Met      PT LONG TERM GOAL #6   Title  Patient will report ability to ride [employer] Airdyne bike with full revolutions    Status  On-going            Plan - 04/22/19 1259    Clinical Impression Statement  [name] reporting no issues with latest HEP update combining squats with heel raises into triple extension. Able to complete revolutions on recumbent bike with resistance for first time today. Continued HEP review and update in preparation for anticipate discharge next week, progressing standing 3-way SLR to green TB while reminding pt to maintain good quad set, and adding hip abduction isometric to triple extension squat. Patient denies need for review of stretches but incorporated manual stretching into joint mobs and MWM to promote maximal R knee  ROM. Patient reporting good comfort with HEP and feels that he will be ready to transition to HEP next week as planned.    Rehab Potential  Good    PT Frequency  1x / week    PT Duration  6 weeks    PT Treatment/Interventions   Patient/family education;Therapeutic exercise;Therapeutic activities;Functional mobility training;Gait training;Stair training;Balance training;Neuromuscular re-education;Manual techniques;Scar mobilization;Passive range of motion;Dry needling;Taping;Joint Manipulations;Cryotherapy;Vasopneumatic Device;Electrical Stimulation;Iontophoresis 63m/ml Dexamethasone;Moist Heat;ADLs/Self Care Home Management    PT Next Visit Plan  discharge assessment    Consulted and Agree with Plan of Care  Patient       Patient will benefit from skilled therapeutic intervention in order to improve the following deficits and impairments:  Abnormal gait, Decreased activity tolerance, Decreased balance, Decreased endurance, Decreased range of motion, Decreased strength, Difficulty walking, Hypomobility, Increased edema, Increased muscle spasms, Impaired flexibility, Improper body mechanics, Pain  Visit Diagnosis: Stiffness of right knee, not elsewhere classified  Acute pain of right knee  Muscle weakness (generalized)  Other abnormalities of gait and mobility  Difficulty in walking, not elsewhere classified  Localized edema     Problem List Patient Active Problem List   Diagnosis Date Noted  . Unilateral primary osteoarthritis, right knee 11/15/2018  . 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 Depressive disorder 11/12/2018  . 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 History of colonic polyps 11/12/2018  . History of nephrolithiasis 11/12/2018  . History of smoking 11/12/2018  . 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 Medication intolerance 11/12/2018  . Old myocardial infarction 11/12/2018  . Essential hypertension 11/12/2018  . Mixed hyperlipidemia 11/12/2018  . Pre-operative cardiovascular examination 11/12/2018  . CAD (coronary artery disease) 11/12/2018  . Bilateral leg edema 05/04/2017  .  Primary osteoarthritis of both knees 03/05/2017  . Type II diabetes mellitus with manifestations ([place] 08/18/2016  . CAP (community acquired pneumonia) 08/17/2016  . Right carotid bruit 08/05/2014  . HYPERLIPIDEMIA-MIXED 06/15/2009  . HYPERTENSION, BENIGN 06/15/2009  . CAD, NATIVE VESSEL 06/15/2009  . Gout 10/14/2007    JPercival Spanish PT, MPT 04/22/2019, 2:12 PM  [facility] [place] [address]Phone: [phone]  Fax:  [phone] Name: WDOY TAAFFEMRN: [id number] of Birth: [birth year]

## 2019-04-29 ENCOUNTER — Encounter: Payer: Self-pay | Admitting: Physical Therapy

## 2019-04-29 ENCOUNTER — Ambulatory Visit: Payer: Medicare Other | Admitting: Physical Therapy

## 2019-04-29 ENCOUNTER — Other Ambulatory Visit: Payer: Self-pay

## 2019-04-29 DIAGNOSIS — M25661 Stiffness of right knee, not elsewhere classified: Secondary | ICD-10-CM | POA: Diagnosis not present

## 2019-04-29 DIAGNOSIS — M25561 Pain in right knee: Secondary | ICD-10-CM

## 2019-04-29 DIAGNOSIS — R600 Localized edema: Secondary | ICD-10-CM

## 2019-04-29 DIAGNOSIS — R2689 Other abnormalities of gait and mobility: Secondary | ICD-10-CM

## 2019-04-29 DIAGNOSIS — R262 Difficulty in walking, not elsewhere classified: Secondary | ICD-10-CM

## 2019-04-29 DIAGNOSIS — M6281 Muscle weakness (generalized): Secondary | ICD-10-CM

## 2019-04-29 NOTE — Therapy (Addendum)
[facility] [address]  [place], [place], [address] Phone: [phone]   Fax:  [phone]  Physical Therapy Treatment / Discharge Summary  Patient Details  Name: [name] [name] MRN: [id number] Date of Birth: [birth year] Referring Provider (PT): Jean Rosenthal, MD   Encounter Date: 04/29/2019  PT End of Session - 04/29/19 1256    Visit Number  17    Number of Visits  17    Date for PT Re-Evaluation  04/29/19    Authorization Type  UHC Medicare    PT Start Time  1287    PT Stop Time  1354    PT Time Calculation (min)  58 min    Activity Tolerance  Patient tolerated treatment well    Behavior During Therapy  [facility] for tasks assessed/performed       Past Medical History:  Diagnosis Date  . 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 DM (diabetes mellitus) ([place])   . HTN (hypertension)   . Hyperlipidemia     Past Surgical History:  Procedure Laterality Date  . CORONARY ANGIOPLASTY WITH STENT PLACEMENT    . LAPAROSCOPIC INGUINAL HERNIA REPAIR    . TOTAL KNEE ARTHROPLASTY Right 11/15/2018   Procedure: RIGHT TOTAL KNEE ARTHROPLASTY;  Surgeon: Mcarthur Rossetti, MD;  Location: [facility];  Service: Orthopedics;  Laterality: Right;    There were no vitals filed for this visit.  Subjective Assessment - 04/29/19 1301    Subjective  Pt reporting he did a lot of walking yesterday while shopping plus several flights of stairs at home - notes knee swelled up some but no pain.    Pertinent History  R TKR 11/15/18    How long can you stand comfortably?  15-20 minutes    How long can you walk comfortably?  30+ minutes    Patient Stated Goals  "unlimited walking and be able to ride my (Sch[employer] Airdyne) bicycle"    Currently in Pain?  No/denies         [facility] PT Assessment - 04/29/19 1256      Assessment   Medical  Diagnosis  R TKR    Referring Provider (PT)  Jean Rosenthal, MD    Onset Date/Surgical Date  11/15/18    Next MD Visit  05/29/2019      AROM   Right Knee Extension  1    Right Knee Flexion  111      Strength   Right Hip Flexion  5/5    Right Hip Extension  4+/5    Right Hip ABduction  4+/5    Right Hip ADduction  4+/5    Left Hip Flexion  5/5    Left Hip Extension  4+/5    Left Hip ABduction  5/5    Left Hip ADduction  4+/5    Right Knee Flexion  5/5    Right Knee Extension  5/5    Left Knee Flexion  5/5    Left Knee Extension  5/5                   [facility] Adult PT Treatment/Exercise - 04/29/19 1256      Ambulation/Gait   Ambulation/Gait Assistance  7: Independent    Gait Pattern  Within Functional Limits    Stairs  Yes    Stairs Assistance  6: Modified independent (Device/Increase time)    Stair Management Technique  One rail Right;Alternating pattern    Number of Stairs  14    Height of Stairs  7      Exercises   Exercises  Knee/Hip      Knee/Hip Exercises: Aerobic   Recumbent Bike  L2 x 6 min      Knee/Hip Exercises: Supine   Quad Sets  Right;20 reps    Quad Sets Limitations  with heel elevated on rolled yoga mat    Short Arc Quad Sets  --   5" hold   Knee Flexion  Both;20 reps;Right;AAROM    Knee Flexion Limitations  HS curls with heels on peanut ball       Modalities   Modalities  Vasopneumatic      Vasopneumatic   Number Minutes Vasopneumatic   10 minutes    Vasopnuematic Location   Knee   Right   Vasopneumatic Pressure  High    Vasopneumatic Temperature   Coldest temp      Manual Therapy   Manual Therapy  Joint mobilization;Soft tissue mobilization;Muscle Energy Technique;Passive ROM    Manual therapy comments  supine & hooklying    Joint Mobilization  R knee - grade II-III A/P tibia on femur to increase flexion ROM; inferior patellar glide at end range flexion ROM; distraction with grade I-II oscillations for pain relief    Soft  tissue mobilization  STM R medial distal HS, distal quads and mid/distal ITB; roller stick to quad and ITB    Passive ROM  R knee hamstring and quad stretching as well as MWM for knee flexion and extension PROM    Muscle Energy Technique  R HS contract/relax for increased R knee extension ROM, R knee flexion with quad contract/relax for increased flexion               PT Short Term Goals - 03/18/19 1237      PT SHORT TERM GOAL #1   Title  Independent with initial HEP    Status  Achieved    Target Date  --      PT SHORT TERM GOAL #2   Title  R knee AROM >/= 5-105 dg    Status  Achieved    Target Date  --        PT Long Term Goals - 04/29/19 1304      PT LONG TERM GOAL #1   Title  Independent with ongoing HEP    Status  Achieved      PT LONG TERM GOAL #2   Title  R knee AROM >/= 3-115 dg to allow for normal gait and stair pattern    Status  Partially Met      PT LONG TERM GOAL #3   Title  B LE strength >/= 4+/5 for improved stability    Status  Achieved      PT LONG TERM GOAL #4   Title  Patient will ambulate with normal gait pattern w/o AD to increase ease of community access    Status  Achieved      PT LONG TERM GOAL #5   Title  Patient will ascend/descend 1 flight of stairs with good reciprocal pattern    Status  Achieved      PT LONG TERM GOAL #6   Title  Patient will report ability to ride [employer] Airdyne bike with full revolutions    Status  Partially Met  Plan - 04/29/19 1305    Clinical Impression Statement  [name] [name] has demonstrated good progress with PT. Gait pattern improved with good foot clearance and normal heel-toe progression as well as good reciprocal stair negotiation. R knee AROM 1-111 dg today with flexion limited by increased LE edema from busy day yesterday. B LE strength now 4+/5 to 5/5. All goals met with exception of R knee flexion ROM and ability to complete full revolutions on Airdyne bicycle due to limited flexion ROM -  patient reporting he is making arrangements to obtain a recumbent bike. [name] [name] is comfortable with plan for transition to HEP at this time but would like to remain on 30-day hold until MD f/u appointment on 05/29/2019.    Rehab Potential  Good    PT Treatment/Interventions  Patient/family education;Therapeutic exercise;Therapeutic activities;Functional mobility training;Gait training;Stair training;Balance training;Neuromuscular re-education;Manual techniques;Scar mobilization;Passive range of motion;Dry needling;Taping;Joint Manipulations;Cryotherapy;Vasopneumatic Device;Electrical Stimulation;Iontophoresis 30m/ml Dexamethasone;Moist Heat;ADLs/Self Care Home Management    PT Next Visit Plan  30-day hold    Consulted and Agree with Plan of Care  Patient       Patient will benefit from skilled therapeutic intervention in order to improve the following deficits and impairments:  Abnormal gait, Decreased activity tolerance, Decreased balance, Decreased endurance, Decreased range of motion, Decreased strength, Difficulty walking, Hypomobility, Increased edema, Increased muscle spasms, Impaired flexibility, Improper body mechanics, Pain  Visit Diagnosis: Stiffness of right knee, not elsewhere classified  Acute pain of right knee  Muscle weakness (generalized)  Other abnormalities of gait and mobility  Difficulty in walking, not elsewhere classified  Localized edema     Problem List Patient Active Problem List   Diagnosis Date Noted  . Unilateral primary osteoarthritis, right knee 11/15/2018  . 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 Depressive disorder 11/12/2018  . 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 History of colonic polyps 11/12/2018  . History of nephrolithiasis 11/12/2018  . History of smoking 11/12/2018  . 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 Medication intolerance  11/12/2018  . Old myocardial infarction 11/12/2018  . Essential hypertension 11/12/2018  . Mixed hyperlipidemia 11/12/2018  . Pre-operative cardiovascular examination 11/12/2018  . CAD (coronary artery disease) 11/12/2018  . Bilateral leg edema 05/04/2017  . Primary osteoarthritis of both knees 03/05/2017  . Type II diabetes mellitus with manifestations ([place] 08/18/2016  . CAP (community acquired pneumonia) 08/17/2016  . Right carotid bruit 08/05/2014  . HYPERLIPIDEMIA-MIXED 06/15/2009  . HYPERTENSION, BENIGN 06/15/2009  . CAD, NATIVE VESSEL 06/15/2009  . Gout 10/14/2007    JPercival Spanish PT, MPT 04/29/2019, 4:05 PM  [facility] [place] [address]Phone: [phone]  Fax:  [phone] Name: WTRINDON DORTONMRN: [id number] of Birth: 108-Sep-1952 PHYSICAL THERAPY DISCHARGE SUMMARY  Visits from Start of Care: 17  Current functional level related to goals / functional outcomes:   Refer to above clinical impression for status as of last visit on 04/29/2019. Patient was placed on hold for 30 days and has not needed to return to PT, therefore will proceed with discharge from PT for this episode.   Remaining deficits:   As above.   Education / Equipment:   HEP  Plan: Patient agrees to discharge.  Patient goals were partially met. Patient is being discharged due to being pleased with the current functional level.  ?????  Percival Spanish, PT, MPT 05/30/19, 9:55 AM  [facility] [address]  [place], [place], [address] Phone: [phone]   Fax:  [phone]

## 2019-05-29 ENCOUNTER — Encounter: Payer: Self-pay | Admitting: Orthopaedic Surgery

## 2019-05-29 ENCOUNTER — Ambulatory Visit: Payer: Self-pay

## 2019-05-29 ENCOUNTER — Other Ambulatory Visit: Payer: Self-pay

## 2019-05-29 ENCOUNTER — Ambulatory Visit (INDEPENDENT_AMBULATORY_CARE_PROVIDER_SITE_OTHER): Payer: Medicare Other | Admitting: Orthopaedic Surgery

## 2019-05-29 DIAGNOSIS — Z96651 Presence of right artificial knee joint: Secondary | ICD-10-CM

## 2019-05-29 NOTE — Progress Notes (Signed)
The patient is now 7 months status post a right total knee arthroplasty.  He is doing well overall.  He is a very active 68 year old gentleman and is pleased that he can ride a bike now.  He does have known significant arthritis in his left knee.  He had injection of a steroid in the left knee back in April of this year.  He says the left knee hurts on and off but does not bother him enough right now to do anything about.  On examination of his left operative knee there is only slight swelling.  The incisions well-healed.  He has full extension to only about 95 to 100 degrees flexion but he is pleased with this.  The knee feels ligamentously stable.  2 views of the right knee show well-seated total knee arthroplasty with no malalignment or complicating features.  At this point follow-up as needed since he is doing so well.  If he has any issues with the left knee that needs any other intervention he will let us know.  If he has any problems in the right knee he will let us know.  All question concerns were answered and addressed.

## 2019-07-18 DIAGNOSIS — Z79899 Other long term (current) drug therapy: Secondary | ICD-10-CM | POA: Diagnosis not present

## 2019-07-18 DIAGNOSIS — I10 Essential (primary) hypertension: Secondary | ICD-10-CM | POA: Diagnosis not present

## 2019-07-18 DIAGNOSIS — E119 Type 2 diabetes mellitus without complications: Secondary | ICD-10-CM | POA: Diagnosis not present

## 2019-08-28 IMAGING — CT CT HEAD W/O CM
3 series · 15 of 47 positions shown, 18 images · non-contrast
Comparison: None.

CLINICAL DATA: Fell today. Taking Plavix.

EXAM:
CT HEAD WITHOUT CONTRAST
TECHNIQUE: Contiguous axial images were obtained from the base of the skull
through the vertex without intravenous contrast.

[Series 2: head wo · axial · 0.44mm/px · z∈[-133,-8]mm · 9 of 31 slices shown, 12 images]
[im 3/31  brain]
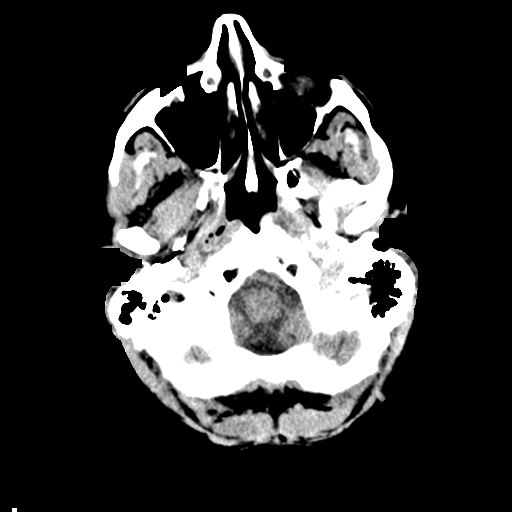
[im 3/31  bone]
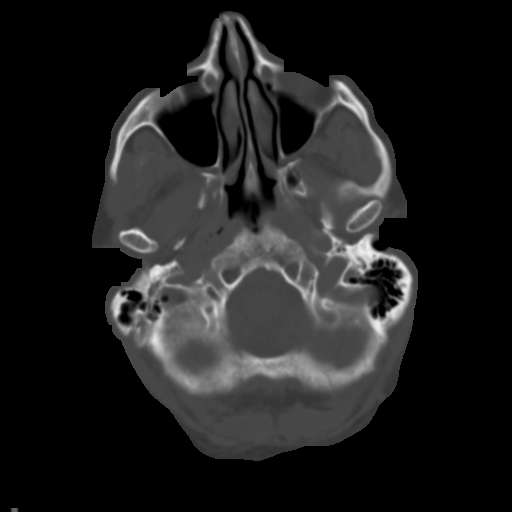
[im 6/31  brain]
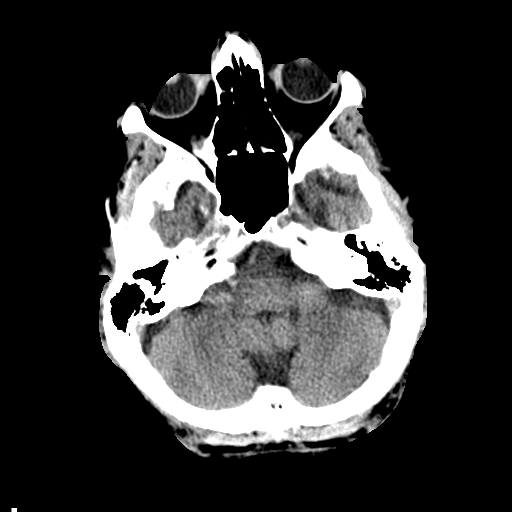
[im 9/31  brain]
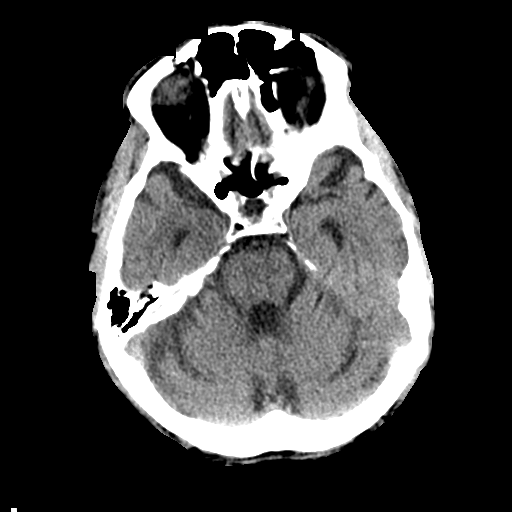
[im 12/31  brain]
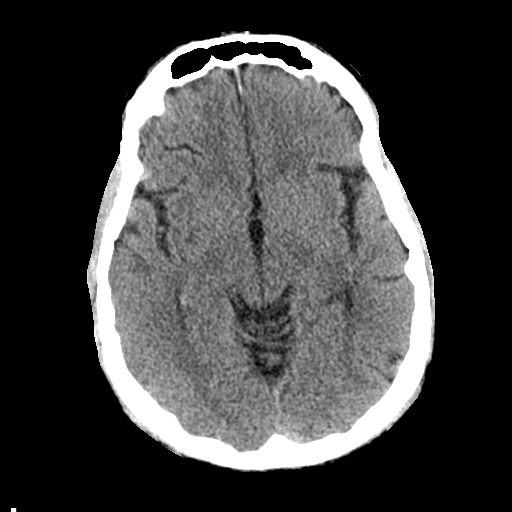
[im 16/31  brain]
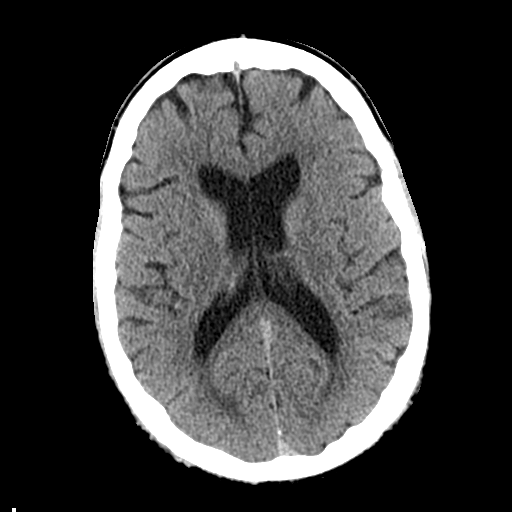
[im 16/31  bone]
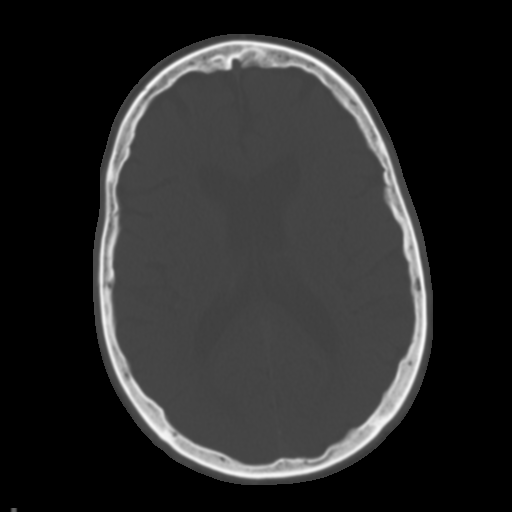
[im 19/31  brain]
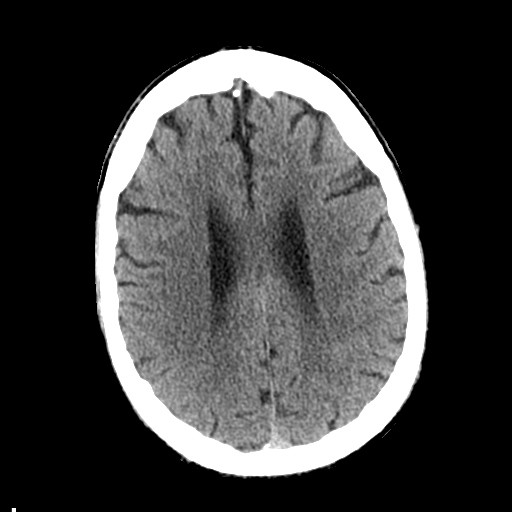
[im 22/31  brain]
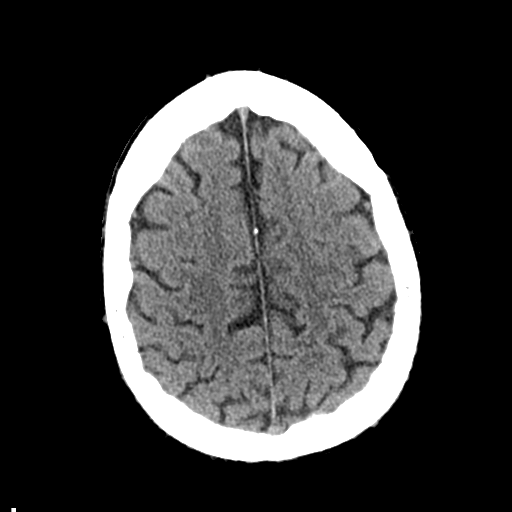
[im 25/31  brain]
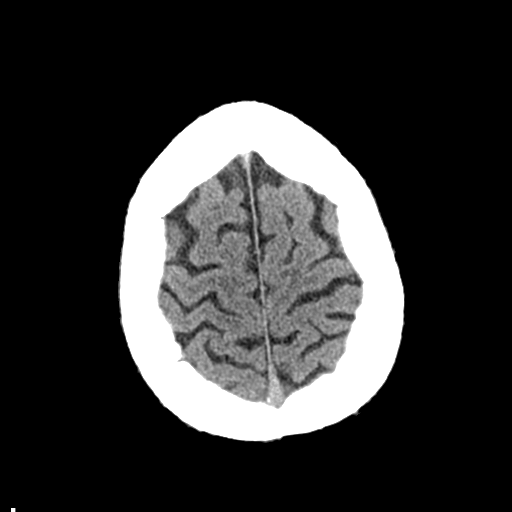
[im 28/31  brain]
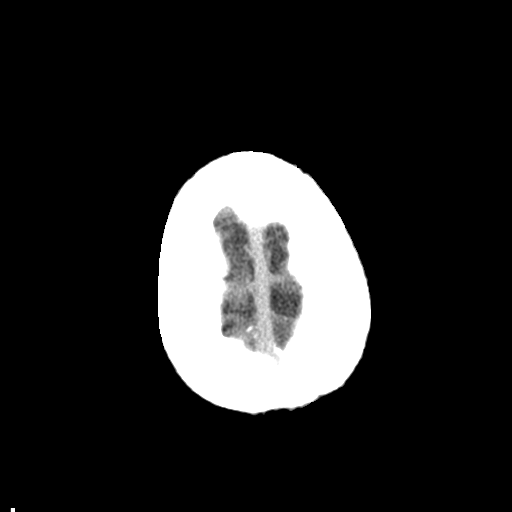
[im 28/31  bone]
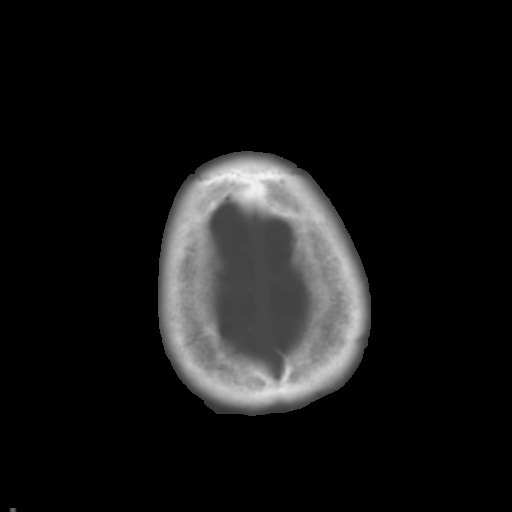

[Series 5: coronal soft tissue · coronal · 0.29mm/px · 3 of 72 slices shown]
[im 24/72  brain]
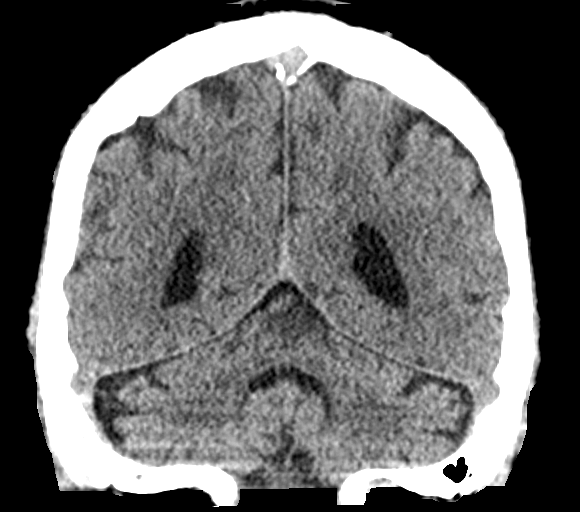
[im 32/72  brain]
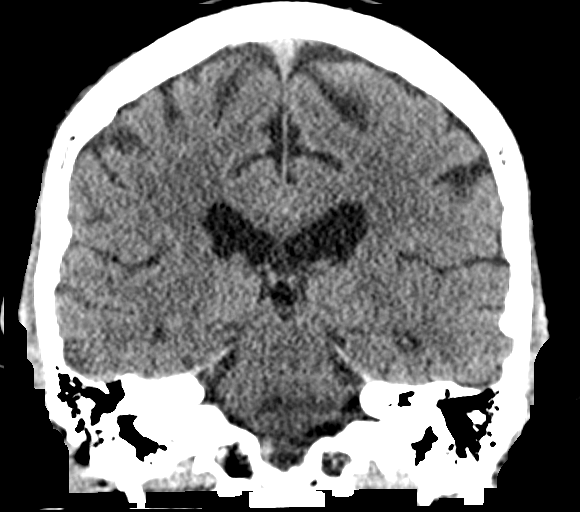
[im 40/72  brain]
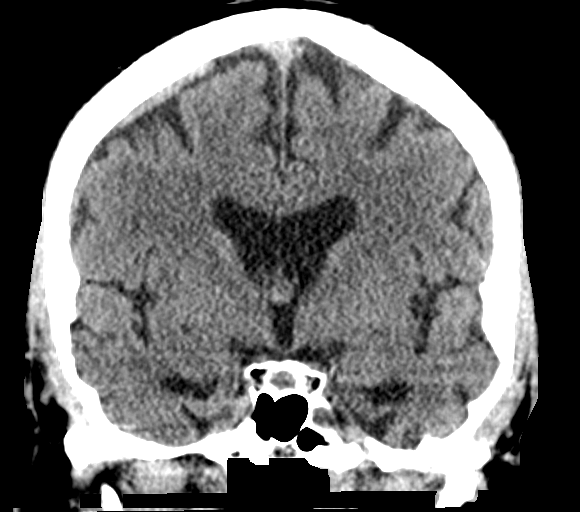

[Series 6: sagittal soft tissue · sagittal · 0.30mm/px · 3 of 58 slices shown]
[im 20/58  brain]
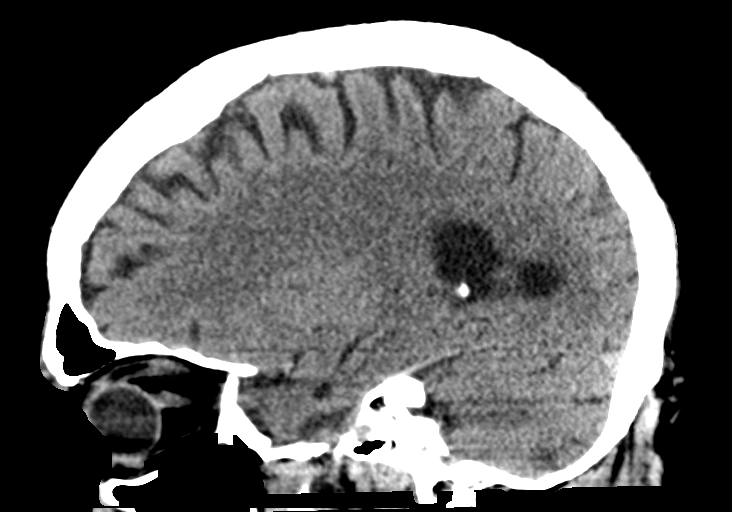
[im 29/58  brain]
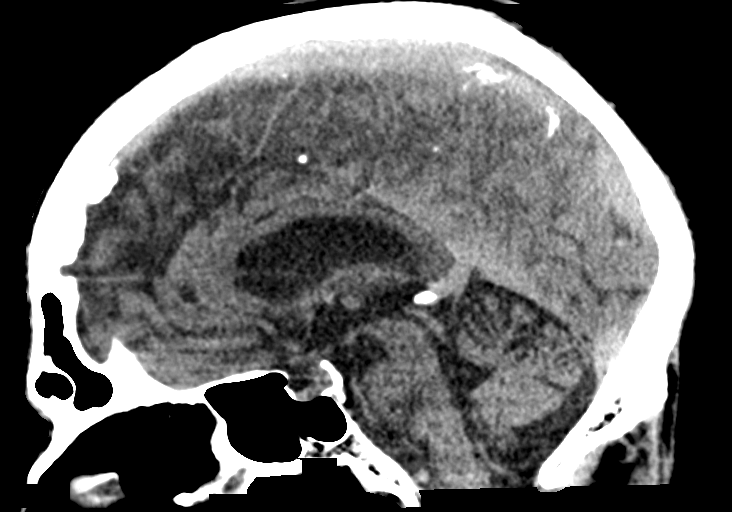
[im 39/58  brain]
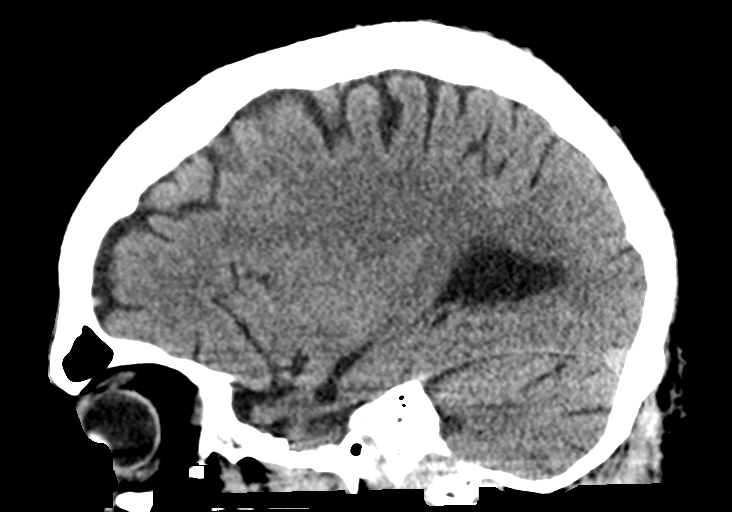

[15 of 47 positions shown; findings below may reference images not displayed]

FINDINGS: Brain: Mild-to-moderate diffuse enlargement of the ventricles and
subarachnoid spaces. Minimal patchy white matter low density in both
cerebral hemispheres. No intracranial hemorrhage, mass lesion or CT
evidence of acute infarction.

Vascular: No hyperdense vessel or unexpected calcification.

Skull: Normal. Negative for fracture or focal lesion.

Sinuses/Orbits: No acute finding.

Other: None.
IMPRESSION: 1. No acute abnormality.
2. Mild to moderate diffuse cerebral and cerebellar atrophy.
3. Minimal chronic small vessel white matter ischemic changes in
both cerebral hemispheres.

## 2019-11-06 ENCOUNTER — Other Ambulatory Visit: Payer: Self-pay

## 2019-11-06 NOTE — Patient Outreach (Signed)
[place] [facility]) Care Management  11/06/2019  [name] [name] Nov 24, 1950 [id number]   Medication Adherence call to [name] [name] HIPPA Compliant Voice message left with a call back number. [name] [name] is showing past due on Telmisartan 80 mg under [place].  [place] Management Direct Dial [phone]  Fax [phone] [email or web address]

## 2019-11-10 DIAGNOSIS — E119 Type 2 diabetes mellitus without complications: Secondary | ICD-10-CM | POA: Diagnosis not present

## 2019-11-10 DIAGNOSIS — E785 Hyperlipidemia, unspecified: Secondary | ICD-10-CM | POA: Diagnosis not present

## 2019-11-10 DIAGNOSIS — I10 Essential (primary) hypertension: Secondary | ICD-10-CM | POA: Diagnosis not present

## 2020-01-16 ENCOUNTER — Ambulatory Visit: Payer: Medicare Other | Attending: Internal Medicine

## 2020-01-16 DIAGNOSIS — Z23 Encounter for immunization: Secondary | ICD-10-CM | POA: Insufficient documentation

## 2020-01-16 NOTE — Progress Notes (Signed)
   Covid-19 Vaccination Clinic  Name:  [name] [name]    MRN: [id number] DOB: August 26, 1951  01/16/2020  [name] was observed post Covid-19 immunization for 15 minutes without incidence. He was provided with Vaccine Information Sheet and instruction to access the V-Safe system.   [name] was instructed to call 911 with any severe reactions post vaccine: Marland Kitchen Difficulty breathing  . Swelling of your face and throat  . A fast heartbeat  . A bad rash all over your body  . Dizziness and weakness    Immunizations Administered    Name Date Dose VIS Date Route   Pfizer COVID-19 Vaccine 01/16/2020 11:15 AM 0.3 mL 10/31/2019 Intramuscular   Manufacturer: [place]   Lot: [id number]   [place]: [id number]

## 2020-01-28 DIAGNOSIS — G5602 Carpal tunnel syndrome, left upper limb: Secondary | ICD-10-CM | POA: Diagnosis not present

## 2020-01-28 DIAGNOSIS — M79642 Pain in left hand: Secondary | ICD-10-CM | POA: Diagnosis not present

## 2020-01-28 DIAGNOSIS — M19032 Primary osteoarthritis, left wrist: Secondary | ICD-10-CM | POA: Diagnosis not present

## 2020-02-06 DIAGNOSIS — E119 Type 2 diabetes mellitus without complications: Secondary | ICD-10-CM | POA: Diagnosis not present

## 2020-02-06 DIAGNOSIS — Z79899 Other long term (current) drug therapy: Secondary | ICD-10-CM | POA: Diagnosis not present

## 2020-02-10 ENCOUNTER — Ambulatory Visit: Payer: Medicare Other | Attending: Internal Medicine

## 2020-02-10 DIAGNOSIS — Z23 Encounter for immunization: Secondary | ICD-10-CM

## 2020-02-10 NOTE — Progress Notes (Signed)
   Covid-19 Vaccination Clinic  Name:  [name] [name]    MRN: [id number] DOB: 28-Feb-1951  02/10/2020  [name] was observed post Covid-19 immunization for 15 minutes without incident. He was provided with Vaccine Information Sheet and instruction to access the V-Safe system.   [name] was instructed to call 911 with any severe reactions post vaccine: Marland Kitchen Difficulty breathing  . Swelling of face and throat  . A fast heartbeat  . A bad rash all over body  . Dizziness and weakness   Immunizations Administered    Name Date Dose VIS Date Route   Pfizer COVID-19 Vaccine 02/10/2020  1:23 PM 0.3 mL 10/31/2019 Intramuscular   Manufacturer: [place]   Lot: [id number]   [place]: [id number]

## 2020-02-16 DIAGNOSIS — G5602 Carpal tunnel syndrome, left upper limb: Secondary | ICD-10-CM | POA: Diagnosis not present

## 2020-04-16 DIAGNOSIS — R718 Other abnormality of red blood cells: Secondary | ICD-10-CM | POA: Diagnosis not present

## 2020-05-06 DIAGNOSIS — M1811 Unilateral primary osteoarthritis of first carpometacarpal joint, right hand: Secondary | ICD-10-CM | POA: Diagnosis not present

## 2020-05-06 DIAGNOSIS — M25561 Pain in right knee: Secondary | ICD-10-CM | POA: Diagnosis not present

## 2020-05-06 DIAGNOSIS — M25562 Pain in left knee: Secondary | ICD-10-CM | POA: Diagnosis not present

## 2020-05-06 DIAGNOSIS — M170 Bilateral primary osteoarthritis of knee: Secondary | ICD-10-CM | POA: Diagnosis not present

## 2020-05-12 DIAGNOSIS — Z79899 Other long term (current) drug therapy: Secondary | ICD-10-CM | POA: Diagnosis not present

## 2020-05-12 DIAGNOSIS — E119 Type 2 diabetes mellitus without complications: Secondary | ICD-10-CM | POA: Diagnosis not present

## 2020-05-12 DIAGNOSIS — R718 Other abnormality of red blood cells: Secondary | ICD-10-CM | POA: Diagnosis not present

## 2020-05-14 DIAGNOSIS — M170 Bilateral primary osteoarthritis of knee: Secondary | ICD-10-CM | POA: Diagnosis not present

## 2020-05-14 DIAGNOSIS — M25562 Pain in left knee: Secondary | ICD-10-CM | POA: Diagnosis not present

## 2020-05-20 DIAGNOSIS — M25562 Pain in left knee: Secondary | ICD-10-CM | POA: Diagnosis not present

## 2020-05-20 DIAGNOSIS — M170 Bilateral primary osteoarthritis of knee: Secondary | ICD-10-CM | POA: Diagnosis not present

## 2020-06-01 DIAGNOSIS — Z794 Long term (current) use of insulin: Secondary | ICD-10-CM | POA: Diagnosis not present

## 2020-06-01 DIAGNOSIS — R718 Other abnormality of red blood cells: Secondary | ICD-10-CM | POA: Diagnosis not present

## 2020-06-01 DIAGNOSIS — E119 Type 2 diabetes mellitus without complications: Secondary | ICD-10-CM | POA: Diagnosis not present

## 2020-06-01 DIAGNOSIS — D751 Secondary polycythemia: Secondary | ICD-10-CM | POA: Insufficient documentation

## 2020-06-01 HISTORY — DX: Secondary polycythemia: D75.1

## 2020-06-02 DIAGNOSIS — D751 Secondary polycythemia: Secondary | ICD-10-CM | POA: Diagnosis not present

## 2020-06-02 DIAGNOSIS — E119 Type 2 diabetes mellitus without complications: Secondary | ICD-10-CM | POA: Diagnosis not present

## 2020-06-02 DIAGNOSIS — Z794 Long term (current) use of insulin: Secondary | ICD-10-CM | POA: Diagnosis not present

## 2020-06-08 DIAGNOSIS — E119 Type 2 diabetes mellitus without complications: Secondary | ICD-10-CM | POA: Diagnosis not present

## 2020-06-08 DIAGNOSIS — D751 Secondary polycythemia: Secondary | ICD-10-CM | POA: Diagnosis not present

## 2020-06-08 DIAGNOSIS — Z794 Long term (current) use of insulin: Secondary | ICD-10-CM | POA: Diagnosis not present

## 2020-06-15 DIAGNOSIS — E119 Type 2 diabetes mellitus without complications: Secondary | ICD-10-CM | POA: Diagnosis not present

## 2020-06-15 DIAGNOSIS — Z794 Long term (current) use of insulin: Secondary | ICD-10-CM | POA: Diagnosis not present

## 2020-06-15 DIAGNOSIS — D751 Secondary polycythemia: Secondary | ICD-10-CM | POA: Diagnosis not present

## 2020-06-22 DIAGNOSIS — D751 Secondary polycythemia: Secondary | ICD-10-CM | POA: Diagnosis not present

## 2020-06-29 DIAGNOSIS — M25531 Pain in right wrist: Secondary | ICD-10-CM | POA: Diagnosis not present

## 2020-06-29 DIAGNOSIS — Z96651 Presence of right artificial knee joint: Secondary | ICD-10-CM | POA: Diagnosis not present

## 2020-06-29 DIAGNOSIS — M25562 Pain in left knee: Secondary | ICD-10-CM | POA: Diagnosis not present

## 2020-07-06 DIAGNOSIS — F17290 Nicotine dependence, other tobacco product, uncomplicated: Secondary | ICD-10-CM | POA: Diagnosis not present

## 2020-07-06 DIAGNOSIS — Z79899 Other long term (current) drug therapy: Secondary | ICD-10-CM | POA: Diagnosis not present

## 2020-07-06 DIAGNOSIS — D751 Secondary polycythemia: Secondary | ICD-10-CM | POA: Diagnosis not present

## 2020-07-06 DIAGNOSIS — D696 Thrombocytopenia, unspecified: Secondary | ICD-10-CM | POA: Diagnosis not present

## 2020-07-09 DIAGNOSIS — Z01812 Encounter for preprocedural laboratory examination: Secondary | ICD-10-CM | POA: Diagnosis not present

## 2020-07-09 DIAGNOSIS — Z79899 Other long term (current) drug therapy: Secondary | ICD-10-CM | POA: Diagnosis not present

## 2020-07-09 DIAGNOSIS — E119 Type 2 diabetes mellitus without complications: Secondary | ICD-10-CM | POA: Diagnosis not present

## 2020-07-12 ENCOUNTER — Telehealth: Payer: Self-pay

## 2020-07-12 NOTE — Telephone Encounter (Signed)
Attempted to reach patient but did not get an answer.

## 2020-07-12 NOTE — Telephone Encounter (Signed)
2nd attempt to reach patient, no answer

## 2020-07-12 NOTE — Telephone Encounter (Signed)
   [facility] Pre-operative Risk Assessment    HEARTCARE STAFF: - Please ensure there is not already an duplicate clearance open for this procedure. - Under Visit Info/Reason for Call, type in Other and utilize the format Clearance MM/DD/YY or Clearance TBD. Do not use dashes or single digits. - If request is for dental extraction, please clarify the # of teeth to be extracted.  Request for surgical clearance:  1. What type of surgery is being performed? Left Knee Arthroplasty    2. When is this surgery scheduled? 07-30-20   3. What type of clearance is required (medical clearance vs. Pharmacy clearance to hold med vs. Both)? Medical  4. Are there any medications that need to be held prior to surgery and how long? N/A   5. Practice name and name of physician performing surgery? [facility]- Dr. Frederik Pear   6. What is the office phone number? [phone]   7.   What is the office fax number? [phone]  8.   Anesthesia type (None, local, MAC, general) ? Spinal Lynnell Chad 07/12/2020, 8:37 AM  _________________________________________________________________   (provider comments below)

## 2020-07-12 NOTE — Telephone Encounter (Signed)
Left msg on voicemail for patient to call the office.

## 2020-07-12 NOTE — Telephone Encounter (Signed)
   Primary Cardiologist:No primary care provider on file.  Chart reviewed as part of pre-operative protocol coverage. Because of BENTLY WYSS past medical history and time since last visit, they will require a follow-up visit in order to better assess preoperative cardiovascular risk.  Pre-op covering staff: - Please schedule appointment and call patient to inform them. If patient already had an upcoming appointment within acceptable timeframe, please add "pre-op clearance" to the appointment notes so provider is aware. - Please contact requesting surgeon's office via preferred method (i.e, phone, fax) to inform them of need for appointment prior to surgery.  Surgery is scheduled for 9/10- he will need an appointment with Dr Geraldo Pitter or an APP before this.   If applicable, this message will also be routed to pharmacy pool and/or primary cardiologist for input on holding anticoagulant/antiplatelet agent as requested below so that this information is available to the clearing provider at time of patient's appointment.   Kerin Ransom, PA-C  07/12/2020, 11:47 AM

## 2020-07-13 NOTE — Telephone Encounter (Signed)
Patient is returning call. Please call back.  °

## 2020-07-14 NOTE — Telephone Encounter (Signed)
Appointment schedule with Dr Geraldo Pitter 08/27 @10 :[name] [name]

## 2020-07-16 ENCOUNTER — Other Ambulatory Visit: Payer: Self-pay

## 2020-07-16 ENCOUNTER — Ambulatory Visit: Payer: Medicare Other | Admitting: Cardiology

## 2020-07-16 ENCOUNTER — Encounter: Payer: Self-pay | Admitting: Cardiology

## 2020-07-16 VITALS — BP 118/78 | HR 78 | Ht 73.0 in | Wt 232.1 lb

## 2020-07-16 DIAGNOSIS — E088 Diabetes mellitus due to underlying condition with unspecified complications: Secondary | ICD-10-CM | POA: Diagnosis not present

## 2020-07-16 DIAGNOSIS — E782 Mixed hyperlipidemia: Secondary | ICD-10-CM

## 2020-07-16 DIAGNOSIS — Z01810 Encounter for preprocedural cardiovascular examination: Secondary | ICD-10-CM

## 2020-07-16 DIAGNOSIS — I480 Paroxysmal atrial fibrillation: Secondary | ICD-10-CM

## 2020-07-16 DIAGNOSIS — I10 Essential (primary) hypertension: Secondary | ICD-10-CM

## 2020-07-16 DIAGNOSIS — I2510 Atherosclerotic heart disease of native coronary artery without angina pectoris: Secondary | ICD-10-CM

## 2020-07-16 HISTORY — DX: Diabetes mellitus due to underlying condition with unspecified complications: E08.8

## 2020-07-16 HISTORY — DX: Paroxysmal atrial fibrillation: I48.0

## 2020-07-16 NOTE — Patient Instructions (Signed)
Medication Instructions:  No medication changes. *If you need a refill on your cardiac medications before your next appointment, please call your pharmacy*   Lab Work: None ordered If you have labs (blood work) drawn today and your tests are completely normal, you will receive your results only by: Marland Kitchen MyChart Message (if you have MyChart) OR . A paper copy in the mail If you have any lab test that is abnormal or we need to change your treatment, we will call you to review the results.   Testing/Procedures: Your physician has requested that you have a lexiscan myoview. For further information please visit [email or web address]. Please follow instruction sheet, as given.  The test will take approximately 3 to 4 hours to complete; you may bring reading material.  If someone comes with you to your appointment, they will need to remain in the main lobby due to limited space in the testing area.  How to prepare for your Myocardial Perfusion Test: . Do not eat or drink 3 hours prior to your test, except you may have water. . Do not consume products containing caffeine (regular or decaffeinated) 12 hours prior to your test. (ex: coffee, chocolate, sodas, tea). . Do bring a list of your current medications with you.  If not listed below, you may take your medications as normal. . Do wear comfortable clothes (no dresses or overalls) and walking shoes, tennis shoes preferred (No heels or open toe shoes are allowed). . Do NOT wear cologne, perfume, aftershave, or lotions (deodorant is allowed). . If these instructions are not followed, your test will have to be rescheduled.    Follow-Up: At [facility], you and your health needs are our priority.  As part of our continuing mission to provide you with exceptional heart care, we have created designated Provider Care Teams.  These Care Teams include your primary Cardiologist (physician) and Advanced Practice Providers (APPs -  Physician Assistants and  Nurse Practitioners) who all work together to provide you with the care you need, when you need it.  We recommend signing up for the patient portal called "MyChart".  Sign up information is provided on this After Visit Summary.  MyChart is used to connect with patients for Virtual Visits (Telemedicine).  Patients are able to view lab/test results, encounter notes, upcoming appointments, etc.  Non-urgent messages can be sent to your provider as well.   To learn more about what you can do with MyChart, go to [email or web address].    Your next appointment:   2 month(s)  The format for your next appointment:   In Person  Provider:   Jyl Heinz, MD   Other Instructions  Cardiac Nuclear Scan A cardiac nuclear scan is a test that is done to check the flow of blood to your heart. It is done when you are resting and when you are exercising. The test looks for problems such as:  Not enough blood reaching a portion of the heart.  The heart muscle not working as it should. You may need this test if:  You have heart disease.  You have had lab results that are not normal.  You have had heart surgery or a balloon procedure to open up blocked arteries (angioplasty).  You have chest pain.  You have shortness of breath. In this test, a special dye (tracer) is put into your bloodstream. The tracer will travel to your heart. A camera will then take pictures of your heart to see how the tracer moves through your  heart. This test is usually done at a hospital and takes 2-4 hours. Tell a doctor about:  Any allergies you have.  All medicines you are taking, including vitamins, herbs, eye drops, creams, and over-the-counter medicines.  Any problems you or family members have had with anesthetic medicines.  Any blood disorders you have.  Any surgeries you have had.  Any medical conditions you have.  Whether you are pregnant or may be pregnant. What are the risks? Generally, this is a  safe test. However, problems may occur, such as:  Serious chest pain and heart attack. This is only a risk if the stress portion of the test is done.  Rapid heartbeat.  A feeling of warmth in your chest. This feeling usually does not last long.  Allergic reaction to the tracer. What happens before the test?  Ask your doctor about changing or stopping your normal medicines. This is important.  Follow instructions from your doctor about what you cannot eat or drink.  Remove your jewelry on the day of the test. What happens during the test?  An IV tube will be inserted into one of your veins.  Your doctor will give you a small amount of tracer through the IV tube.  You will wait for 20-40 minutes while the tracer moves through your bloodstream.  Your heart will be monitored with an electrocardiogram (ECG).  You will lie down on an exam table.  Pictures of your heart will be taken for about 15-20 minutes.  You may also have a stress test. For this test, one of these things may be done: ? You will be asked to exercise on a treadmill or a stationary bike. ? You will be given medicines that will make your heart work harder. This is done if you are unable to exercise.  When blood flow to your heart has peaked, a tracer will again be given through the IV tube.  After 20-40 minutes, you will get back on the exam table. More pictures will be taken of your heart.  Depending on the tracer that is used, more pictures may need to be taken 3-4 hours later.  Your IV tube will be removed when the test is over. The test may vary among doctors and hospitals. What happens after the test?  Ask your doctor: ? Whether you can return to your normal schedule, including diet, activities, and medicines. ? Whether you should drink more fluids. This will help to remove the tracer from your body. Drink enough fluid to keep your pee (urine) pale yellow.  Ask your doctor, or the department that is  doing the test: ? When will my results be ready? ? How will I get my results? Summary  A cardiac nuclear scan is a test that is done to check the flow of blood to your heart.  Tell your doctor whether you are pregnant or may be pregnant.  Before the test, ask your doctor about changing or stopping your normal medicines. This is important.  Ask your doctor whether you can return to your normal activities. You may be asked to drink more fluids. This information is not intended to replace advice given to you by your health care provider. Make sure you discuss any questions you have with your health care provider. Document Revised: 02/26/2019 Document Reviewed: 04/22/2018 Elsevier Patient Education  [place].

## 2020-07-16 NOTE — Progress Notes (Addendum)
Cardiology Office Note:    Date:  07/16/2020   ID:  JONNY DEARDEN, DOB Dec 26, 1950, MRN [id number]  PCP:  Raeanne Gathers, MD  Cardiologist:  Jenean Lindau, MD   Referring MD: Raeanne Gathers, MD    ASSESSMENT:    1. Mixed hyperlipidemia   2. HYPERTENSION, BENIGN   3. Essential hypertension   4. Coronary artery disease involving native coronary artery of native heart without angina pectoris   5. Pre-operative cardiovascular examination   6. Diabetes mellitus due to underlying condition with unspecified complications ([place])    PLAN:    In order of problems listed above:  1. Coronary artery disease: Secondary prevention stressed with the patient.  Importance of compliance with diet medication stressed and vocalized understanding. 2. Essential hypertension: Blood pressure is stable 3. Mixed dyslipidemia diabetes mellitus: Diet was emphasized.  He vocalized understanding. 4. Preoperative risk stratification: Patient has multiple risk factors and leads a sedentary lifestyle.  He has established coronary artery disease.  He has to be evaluated with a Lexiscan sestamibi for preoperative risk stratification and is agreeable.  The stress test is negative and is not at high risk for coronary events during the aforementioned surgery.  Meticulous hemodynamic monitoring will further reduce the risk of coronary events.  If his stress test is negative then we can withhold his clopidogrel for a period of time as the surgeons feel appropriate since his stenting was more than a year ago. 5. Paroxysmal atrial fibrillation: This is new for the patient.  I discussed with the patient that the heart rate is under good control.  Anticoagulation was discussed.  Benefits and potential is explained and he vocalized understanding.  He wants to pursue this after his surgery and I respect his wishes.  Again issues such as thromboembolism were revisited and questions were answered to  satisfaction.  Addendum: Patient stress test and echocardiogram revealed moderately depressed ventricular systolic function.  Stress test did not reveal any evidence of ischemia.  In view of this patient is at moderate risk for coronary events during the aforementioned surgery.  Meticulous hemodynamic monitoring in the perioperative.  Will further reduce the risk of coronary events.  Please do not hesitate to call us with any questions in his cardiovascular management.  Let us know after the surgery when it would be appropriate to start him on anticoagulation as he has paroxysmal atrial fibrillation. Thank you  Dr. Sunny Schlein Sunil Hue 07/29/2020 at 1:48 PM   Medication Adjustments/Labs and Tests Ordered: Current medicines are reviewed at length with the patient today.  Concerns regarding medicines are outlined above.  Orders Placed This Encounter  Procedures  . MYOCARDIAL PERFUSION IMAGING  . EKG 12-Lead   No orders of the defined types were placed in this encounter.    No chief complaint on file.    History of Present Illness:    SHAWNDELL [name] is a 69 y.o. male.  Patient has past medical history of coronary artery disease, essential hypertension dyslipidemia and diabetes mellitus.  He denies any problems at this time and takes care of activities of daily living.  No chest pain orthopnea or PND.  He leads a sedentary lifestyle because of orthopedic issues involving the knee and is planning to undergo knee replacement.  At the time of my evaluation, the patient is alert awake oriented and in no distress.  Past Medical History:  Diagnosis Date  . CAD (coronary artery disease)    nonST elevated MI in Oct 2008 treated w/2  drug -eluting stents to the RCA and  mid LAD, EF 55%  . DM (diabetes mellitus) ([place])   . HTN (hypertension)   . Hyperlipidemia     Past Surgical History:  Procedure Laterality Date  . CORONARY ANGIOPLASTY WITH STENT PLACEMENT    . LAPAROSCOPIC INGUINAL HERNIA REPAIR     . 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 ALPRAZolam (XANAX) 0.25 MG tablet Take 0.25 mg by mouth at bedtime as needed for anxiety.   Marland Kitchen aspirin 81 MG tablet Take 81 mg by mouth at bedtime.   Marland Kitchen CIALIS 20 MG tablet Take 20 mg by mouth daily as needed for erectile dysfunction.   . clopidogrel (PLAVIX) 75 MG tablet Take 1 tablet (75 mg total) by mouth daily.  . cyanocobalamin (,VITAMIN B-12,) 1000 MCG/ML injection Inject 1,000 mcg into the muscle every 14 (fourteen) days.  Marland Kitchen FREESTYLE INSULINX TEST test strip   . furosemide (LASIX) 20 MG tablet Take 20 mg by mouth daily.  Marland Kitchen glipiZIDE (GLUCOTROL XL) 10 MG 24 hr tablet Take 10 mg by mouth at bedtime.  . Lancets (FREESTYLE) lancets   . LANTUS SOLOSTAR 100 UNIT/ML Solostar Pen Inject 25 Units into the skin at bedtime.  . levalbuterol (XOPENEX HFA) 45 MCG/ACT inhaler Inhale 1 puff into the lungs every 4 (four) hours as needed for wheezing.   Marland Kitchen NOVOFINE 30G X 8 MM MISC   . rosuvastatin (CRESTOR) 20 MG tablet Take 20 mg by mouth at bedtime.   Marland Kitchen telmisartan (MICARDIS) 80 MG tablet Take 40 mg by mouth daily.      Allergies:   Allopurinol, Aripiprazole, Bupropion, Buspirone, Desvenlafaxine, Duloxetine, Escitalopram, Fish oil, Fluticasone furoate, Hydroxyzine hcl, Nortriptyline, Paroxetine hcl, Ramipril, Sertraline, Venlafaxine, and Vilazodone   Social History   Socioeconomic History  . Marital status: Married    Spouse name: Not on file  . Number of children: Not on file  . Years of education: Not on file  . Highest education level: Not on file  Occupational History  . Not on file  Tobacco Use  . Smoking status: Former Smoker    Packs/day: 0.50    Years: 10.00    Pack years: 5.00  . Smokeless tobacco: Former Systems developer    Types: Snuff, Sarina Ser     Quit date: 11/20/1968  Vaping Use  . Vaping Use: Never used  Substance and Sexual Activity  . Alcohol use: No  . Drug use: No  . Sexual activity: Yes  Other Topics Concern  . Not on file  Social History Narrative  . Not on file   Social Determinants of Health   Financial Resource Strain:   . Difficulty of Paying Living Expenses: Not on file  Food Insecurity:   . Worried About Charity fundraiser in the Last Year: Not on file  . Ran Out of Food in the Last Year: Not on file  Transportation Needs:   . Lack of Transportation (Medical): Not on file  . Lack of Transportation (Non-Medical): Not on file  Physical Activity:   . Days of Exercise per Week: Not on file  . Minutes of Exercise per Session: Not on file  Stress:   . Feeling of Stress : Not on file  Social Connections:   . Frequency of Communication with Friends and Family: Not  on file  . Frequency of Social Gatherings with Friends and Family: Not on file  . Attends Religious Services: Not on file  . Active Member of Clubs or Organizations: Not on file  . Attends Archivist Meetings: Not on file  . Marital Status: Not on file     Family History: The patient's family history includes Cancer in his mother; Diabetes in his father; Heart attack in his father; Hypertension in his brother, father, and mother; Irregular heart beat in his brother.  ROS:   Please see the history of present illness.    All other systems reviewed and are negative.  EKGs/Labs/Other Studies Reviewed:    The following studies were reviewed today: EKG reveals atrial fibrillation with well-controlled ventricular rate.  This is new.   Recent Labs: No results found for requested labs within last 8760 hours.  Recent Lipid Panel    Component Value Date/Time   CHOL  02/10/2011 0700    93        ATP III CLASSIFICATION:  <200     mg/dL   Desirable  200-239  mg/dL   Borderline High  >=240    mg/dL   High          TRIG 149 02/10/2011 0700    HDL 30 (L) 02/10/2011 0700   CHOLHDL 3.1 02/10/2011 0700   VLDL 30 02/10/2011 0700   LDLCALC  02/10/2011 0700    33        Total Cholesterol/HDL:CHD Risk Coronary Heart Disease Risk Table                     Men   Women  1/2 Average Risk   3.4   3.3  Average Risk       5.0   4.4  2 X Average Risk   9.6   7.1  3 X Average Risk  23.4   11.0        Use the calculated Patient Ratio above and the CHD Risk Table to determine the patient's CHD Risk.        ATP III CLASSIFICATION (LDL):  <100     mg/dL   Optimal  100-129  mg/dL   Near or Above                    Optimal  130-159  mg/dL   Borderline  160-189  mg/dL   High  >190     mg/dL   Very High    Physical Exam:    VS:  BP 118/78   Pulse 78   Ht 6\' 1"  (1.854 m)   Wt 232 lb 1.9 oz (105.3 kg)   SpO2 98%   BMI 30.62 kg/m     Wt Readings from Last 3 Encounters:  07/16/20 232 lb 1.9 oz (105.3 kg)  11/15/18 217 lb (98.4 kg)  11/14/18 217 lb (98.4 kg)     GEN: Patient is in no acute distress HEENT: Normal NECK: No JVD; No carotid bruits LYMPHATICS: No lymphadenopathy CARDIAC: Hear sounds regular, 2/6 systolic murmur at the apex. RESPIRATORY:  Clear to auscultation without rales, wheezing or rhonchi  ABDOMEN: Soft, non-tender, non-distended MUSCULOSKELETAL:  No edema; No deformity  SKIN: Warm and dry NEUROLOGIC:  Alert and oriented x 3 PSYCHIATRIC:  Normal affect   Signed, Jenean Lindau, MD  07/16/2020 10:43 AM    [place]

## 2020-07-20 DIAGNOSIS — M1712 Unilateral primary osteoarthritis, left knee: Secondary | ICD-10-CM | POA: Diagnosis not present

## 2020-07-21 ENCOUNTER — Telehealth (HOSPITAL_COMMUNITY): Payer: Self-pay

## 2020-07-21 NOTE — Telephone Encounter (Signed)
Encounter complete. 

## 2020-07-23 ENCOUNTER — Other Ambulatory Visit: Payer: Self-pay

## 2020-07-23 ENCOUNTER — Ambulatory Visit (HOSPITAL_COMMUNITY)
Admission: RE | Admit: 2020-07-23 | Discharge: 2020-07-23 | Disposition: A | Discharge status: Home / Self Care | Payer: Medicare Other | Source: Ambulatory Visit | Attending: Cardiology | Admitting: Cardiology

## 2020-07-23 DIAGNOSIS — Z01810 Encounter for preprocedural cardiovascular examination: Secondary | ICD-10-CM | POA: Diagnosis not present

## 2020-07-23 DIAGNOSIS — I2510 Atherosclerotic heart disease of native coronary artery without angina pectoris: Secondary | ICD-10-CM

## 2020-07-23 DIAGNOSIS — E088 Diabetes mellitus due to underlying condition with unspecified complications: Secondary | ICD-10-CM

## 2020-07-23 LAB — MYOCARDIAL PERFUSION IMAGING
LV dias vol: 213 mL (ref 62–150)
LV sys vol: 169 mL
Peak HR: 88 {beats}/min
Rest HR: 80 {beats}/min
SDS: 4
SRS: 9
SSS: 13
TID: 1.19

## 2020-07-23 MED ORDER — REGADENOSON 0.4 MG/5ML IV SOLN
0.4000 mg | Freq: Once | INTRAVENOUS | Status: AC
  Administered 2020-07-23: 0.4 mg via INTRAVENOUS

## 2020-07-23 MED ORDER — TECHNETIUM TC 99M TETROFOSMIN IV KIT
10.1000 | PACK | Freq: Once | INTRAVENOUS | Status: AC | PRN
  Administered 2020-07-23: 10.1 via INTRAVENOUS
  Filled 2020-07-23: qty 11

## 2020-07-23 MED ORDER — TECHNETIUM TC 99M TETROFOSMIN IV KIT
30.2000 | PACK | Freq: Once | INTRAVENOUS | Status: AC | PRN
  Administered 2020-07-23: 30.2 via INTRAVENOUS
  Filled 2020-07-23: qty 31

## 2020-07-27 ENCOUNTER — Telehealth: Payer: Self-pay | Admitting: Emergency Medicine

## 2020-07-27 DIAGNOSIS — R931 Abnormal findings on diagnostic imaging of heart and coronary circulation: Secondary | ICD-10-CM

## 2020-07-27 NOTE — Telephone Encounter (Signed)
Called patient informed him of stress test results and that Dr. Geraldo Pitter wants him to have a echo. Informed patient he is scheduled for tomorrow in [place], gave him the address. No further questions.

## 2020-07-28 ENCOUNTER — Ambulatory Visit (INDEPENDENT_AMBULATORY_CARE_PROVIDER_SITE_OTHER): Payer: Medicare Other

## 2020-07-28 ENCOUNTER — Other Ambulatory Visit: Payer: Self-pay

## 2020-07-28 DIAGNOSIS — R931 Abnormal findings on diagnostic imaging of heart and coronary circulation: Secondary | ICD-10-CM | POA: Diagnosis not present

## 2020-07-28 DIAGNOSIS — I2510 Atherosclerotic heart disease of native coronary artery without angina pectoris: Secondary | ICD-10-CM

## 2020-07-28 LAB — ECHOCARDIOGRAM COMPLETE
Area-P 1/2: 4.31 cm2
Calc EF: 20.9 %
S' Lateral: 4.5 cm
Single Plane A2C EF: 24.2 %
Single Plane A4C EF: 12.9 %

## 2020-07-28 NOTE — Progress Notes (Addendum)
Complete echocardiogram has been perormmed.  Jimmy Quinlyn Tep RDCS, RVT

## 2020-07-29 ENCOUNTER — Telehealth: Payer: Self-pay | Admitting: Cardiology

## 2020-07-29 NOTE — Telephone Encounter (Signed)
° ° °  Pt is calling to follow up his echo result. He said his surgeon need it to schedule his procedure.

## 2020-07-30 NOTE — Telephone Encounter (Signed)
Pt returning call

## 2020-07-30 NOTE — Telephone Encounter (Signed)
Called patient informed him of results. He reports his surgery was cancelled but it is looking to have it somewhere else.

## 2020-07-30 NOTE — Telephone Encounter (Signed)
Also scheduled him for an appointment to see patient per patient request.

## 2020-08-12 ENCOUNTER — Ambulatory Visit: Payer: Medicare Other | Admitting: Cardiology

## 2020-08-12 ENCOUNTER — Other Ambulatory Visit: Payer: Self-pay

## 2020-08-12 ENCOUNTER — Telehealth: Payer: Self-pay | Admitting: Cardiology

## 2020-08-12 VITALS — BP 137/90 | HR 88 | Ht 73.0 in | Wt 228.6 lb

## 2020-08-12 DIAGNOSIS — I10 Essential (primary) hypertension: Secondary | ICD-10-CM

## 2020-08-12 DIAGNOSIS — Z01810 Encounter for preprocedural cardiovascular examination: Secondary | ICD-10-CM | POA: Diagnosis not present

## 2020-08-12 DIAGNOSIS — Z87891 Personal history of nicotine dependence: Secondary | ICD-10-CM

## 2020-08-12 DIAGNOSIS — I2510 Atherosclerotic heart disease of native coronary artery without angina pectoris: Secondary | ICD-10-CM | POA: Diagnosis not present

## 2020-08-12 DIAGNOSIS — E119 Type 2 diabetes mellitus without complications: Secondary | ICD-10-CM | POA: Insufficient documentation

## 2020-08-12 DIAGNOSIS — E785 Hyperlipidemia, unspecified: Secondary | ICD-10-CM | POA: Insufficient documentation

## 2020-08-12 DIAGNOSIS — E088 Diabetes mellitus due to underlying condition with unspecified complications: Secondary | ICD-10-CM

## 2020-08-12 DIAGNOSIS — N289 Disorder of kidney and ureter, unspecified: Secondary | ICD-10-CM

## 2020-08-12 DIAGNOSIS — I255 Ischemic cardiomyopathy: Secondary | ICD-10-CM

## 2020-08-12 HISTORY — DX: Ischemic cardiomyopathy: I25.5

## 2020-08-12 HISTORY — DX: Disorder of kidney and ureter, unspecified: N28.9

## 2020-08-12 MED ORDER — SACUBITRIL-VALSARTAN 24-26 MG PO TABS: 1.0000 | ORAL_TABLET | Freq: Two times a day (BID) | ORAL | 3 refills | Status: DC

## 2020-08-12 NOTE — Patient Instructions (Signed)
Medication Instructions:  Your physician has recommended you make the following change in your medication:   Take Telmisartan's Friday dose and then stop. Do not take on Saturday or Sunday. Start Entresto 24-45 mg on Monday.  *If you need a refill on your cardiac medications before your next appointment, please call your pharmacy*   Lab Work: Your physician recommends that you have a BEMT done today in the office. Your physician recommends that you return for lab work in: 2 weeks for a repeat BMET. You will not need an appointment.  If you have labs (blood work) drawn today and your tests are completely normal, you will receive your results only by: Marland Kitchen MyChart Message (if you have MyChart) OR . A paper copy in the mail If you have any lab test that is abnormal or we need to change your treatment, we will call you to review the results.   Testing/Procedures: None ordered   Follow-Up: At [facility], you and your health needs are our priority.  As part of our continuing mission to provide you with exceptional heart care, we have created designated Provider Care Teams.  These Care Teams include your primary Cardiologist (physician) and Advanced Practice Providers (APPs -  Physician Assistants and Nurse Practitioners) who all work together to provide you with the care you need, when you need it.  We recommend signing up for the patient portal called "MyChart".  Sign up information is provided on this After Visit Summary.  MyChart is used to connect with patients for Virtual Visits (Telemedicine).  Patients are able to view lab/test results, encounter notes, upcoming appointments, etc.  Non-urgent messages can be sent to your provider as well.   To learn more about what you can do with MyChart, go to [email or web address].    Your next appointment:   1 month(s)  The format for your next appointment:   In Person  Provider:   Jyl Heinz, MD   Other Instructions Sacubitril;  Valsartan Oral Tablets What is this medicine? SACUBITRIL; VALSARTAN (sak UE bi tril; val SAR tan) is a combination of a neprilysin inhibitor and a an angiotensin II receptor blocker. It treats heart failure. This medicine may be used for other purposes; ask your health care provider or pharmacist if you have questions. COMMON BRAND NAME(S): Entresto What should I tell my health care provider before I take this medicine? They need to know if you have any of these conditions:  diabetes and take a medicine that contains aliskiren  kidney disease  liver disease  an unusual or allergic reaction to sacubitril; valsartan, drugs called angiotensin converting enzyme (ACE) inhibitors, angiotensin II receptor blockers (ARBs), other medicines, foods, dyes, or preservatives  pregnant or trying to get pregnant  breast-feeding How should I use this medicine? Take this drug by mouth. Take it as directed on the prescription label at the same time every day. You can take it with or without food. If it upsets your stomach, take it with food. Keep taking it unless your health care provider tells you to stop. Talk to your health care provider about the use of this drug in children. While it may be prescribed for children as young as 1 for selected conditions, precautions do apply. Overdosage: If you think you have taken too much of this medicine contact a poison control center or emergency room at once. NOTE: This medicine is only for you. Do not share this medicine with others. What if I miss a dose? If you  miss a dose, take it as soon as you can. If it is almost time for your next dose, take only that dose. Do not take double or extra doses. What may interact with this medicine? Do not take this medicine with any of the following medicines:  aliskiren if you have diabetes  angiotensin-converting enzyme (ACE) inhibitors, like benazepril, captopril, enalapril, fosinopril, lisinopril, or ramipril This  medicine may also interact with the following medicines:  angiotensin II receptor blockers (ARBs) like azilsartan, candesartan, eprosartan, irbesartan, losartan, olmesartan, telmisartan, or valsartan  lithium  NSAIDS, medicines for pain and inflammation, like ibuprofen or naproxen  potassium-sparing diuretics like amiloride, spironolactone, and triamterene  potassium supplements This list may not describe all possible interactions. Give your health care provider a list of all the medicines, herbs, non-prescription drugs, or dietary supplements you use. Also tell them if you smoke, drink alcohol, or use illegal drugs. Some items may interact with your medicine. What should I watch for while using this medicine? Tell your doctor or healthcare professional if your symptoms do not start to get better or if they get worse. Do not become pregnant while taking this medicine. Women should inform their doctor if they wish to become pregnant or think they might be pregnant. There is a potential for serious side effects to an unborn child. Talk to your health care professional or pharmacist for more information. You may get dizzy. Do not drive, use machinery, or do anything that needs mental alertness until you know how this medicine affects you. Do not stand or sit up quickly, especially if you are an older patient. This reduces the risk of dizzy or fainting spells. Avoid alcoholic drinks; they can make you more dizzy. What side effects may I notice from receiving this medicine? Side effects that you should report to your doctor or health care professional as soon as possible:  allergic reactions like skin rash, itching or hives, swelling of the face, lips, or tongue  signs and symptoms of increased potassium like muscle weakness; chest pain; or fast, irregular heartbeat  signs and symptoms of kidney injury like trouble passing urine or change in the amount of urine  signs and symptoms of low blood  pressure like feeling dizzy or lightheaded, or if you develop extreme fatigue Side effects that usually do not require medical attention (report to your doctor or health care professional if they continue or are bothersome):  cough This list may not describe all possible side effects. Call your doctor for medical advice about side effects. You may report side effects to FDA at 1-800-FDA-1088. Where should I keep my medicine? Keep out of the reach of children and pets. Store at room temperature between 20 and 25 degrees C (68 and 77 degrees F). Protect from moisture. Keep the container tightly closed. Throw away any unused drug after the expiration date. NOTE: This sheet is a summary. It may not cover all possible information. If you have questions about this medicine, talk to your doctor, pharmacist, or health care provider.  2020 Elsevier/Gold Standard (2019-06-11 16:03:07)

## 2020-08-12 NOTE — Telephone Encounter (Signed)
New message:     Patient calling to see if he can get a order to have labs done at [facility] close to him.

## 2020-08-12 NOTE — Telephone Encounter (Signed)
Pt c/o medication issue:  1. Name of Medication: sacubitril-valsartan (ENTRESTO) 24-26 MG   2. How are you currently taking this medication (dosage and times per day)?   3. Are you having a reaction (difficulty breathing--STAT)?   4. What is your medication issue? Patient states he shouldn't be taking it with Ace-inhibtors

## 2020-08-12 NOTE — Progress Notes (Signed)
Cardiology Office Note:    Date:  08/12/2020   ID:  [name] [name], DOB 12/04/1950, MRN [id number]  PCP:  Raeanne Gathers, MD  Cardiologist:  Jenean Lindau, MD   Referring MD: Raeanne Gathers, MD    ASSESSMENT:    1. Essential hypertension   2. Coronary artery disease involving native coronary artery of native heart without angina pectoris   3. Diabetes mellitus due to underlying condition with unspecified complications ([place])   4. History of smoking   5. Pre-operative cardiovascular examination   6. Renal insufficiency   7. Ischemic cardiomyopathy    PLAN:    In order of problems listed above:  1. Ischemic cardiomyopathy: Secondary prevention stressed with the patient.  Importance of compliance with diet medication stressed and he vocalized understanding.  In view of this I discussed with him Entresto treatment.  He is agreeable.  Benefits and potential is explained to him.  He has renal insufficiency.  He will get a Chem-7 today.  I told him to stop telmisartan beginning on Friday.  We will initiate him on Entresto low-dose beginning Monday.  Instructions were given.  Benefits and potential is explained and he vocalized understanding.  He will have a Chem-7 in 2 weeks from now.  He will be seen in follow-up appointment in a month or earlier if he has any concerns. 2. Coronary artery disease: As mentioned above 3. Mixed dyslipidemia and diabetes mellitus: On appropriate therapy.  We will monitor this closely.  Weight reduction was stressed. 4. Renal insufficiency: We will continue to monitor.  This is managed by his primary care provider. Patient had multiple questions which were answered to his satisfaction.   Medication Adjustments/Labs and Tests Ordered: Current medicines are reviewed at length with the patient today.  Concerns regarding medicines are outlined above.  No orders of the defined types were placed in this encounter.  No orders of the defined types were placed  in this encounter.    No chief complaint on file.    History of Present Illness:    [name] [name] is a 69 y.o. male.  Patient has past medical history of coronary artery disease, essential hypertension dyslipidemia diabetes mellitus and renal insufficiency.  He denies any problems at this time and takes care of activities of daily living.  No chest pain orthopnea or PND.  At the time of my evaluation, the patient is alert awake oriented and in no distress.  Patient mentions to me that his surgery was canceled because his hemoglobin A1c was elevated.  He is upset about it.  At the time of my evaluation, the patient is alert awake oriented and in no distress.  Past Medical History:  Diagnosis Date  . Bilateral leg edema 05/04/2017  . 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 CAD, NATIVE VESSEL 06/15/2009   Qualifier: Diagnosis of  By: Haroldine Laws, MD, Eileen Stanford Depressive disorder 11/12/2018  . Diabetes mellitus due to underlying condition with unspecified complications ([place]) 6/75/9163  . DM (diabetes mellitus) ([place])   . 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 History of nephrolithiasis 11/12/2018  . HTN (hypertension)   . Hyperlipidemia   . HYPERTENSION, BENIGN 06/15/2009  . 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 Medication intolerance 11/12/2018  . Mixed hyperlipidemia 11/12/2018  .  Old myocardial infarction 11/12/2018  . Paroxysmal atrial fibrillation ([place]) 07/16/2020  . Pre-operative cardiovascular examination 11/12/2018  . Right carotid bruit 08/05/2014  . 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 Unilateral primary osteoarthritis, right knee 11/15/2018    Past Surgical History:  Procedure Laterality Date  . CORONARY ANGIOPLASTY WITH STENT PLACEMENT    . LAPAROSCOPIC INGUINAL  HERNIA REPAIR    . 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 ALPRAZolam (XANAX) 0.25 MG tablet Take 0.25 mg by mouth at bedtime as needed for anxiety.   Marland Kitchen aspirin 81 MG tablet Take 81 mg by mouth at bedtime.   Marland Kitchen CIALIS 20 MG tablet Take 20 mg by mouth daily as needed for erectile dysfunction.   . clopidogrel (PLAVIX) 75 MG tablet Take 1 tablet (75 mg total) by mouth daily.  . cyanocobalamin (,VITAMIN B-12,) 1000 MCG/ML injection Inject 1,000 mcg into the muscle every 30 (thirty) days.   Marland Kitchen FREESTYLE INSULINX TEST test strip   . furosemide (LASIX) 20 MG tablet Take 20 mg by mouth as needed.   Marland Kitchen glipiZIDE (GLUCOTROL XL) 10 MG 24 hr tablet Take 10 mg by mouth at bedtime.  . Lancets (FREESTYLE) lancets   . LANTUS SOLOSTAR 100 UNIT/ML Solostar Pen Inject 25 Units into the skin at bedtime.  . levalbuterol (XOPENEX HFA) 45 MCG/ACT inhaler Inhale 1 puff into the lungs every 4 (four) hours as needed for wheezing.   Marland Kitchen NOVOFINE 30G X 8 MM MISC   . rosuvastatin (CRESTOR) 20 MG tablet Take 20 mg by mouth at bedtime.   Marland Kitchen telmisartan (MICARDIS) 80 MG tablet Take 40 mg by mouth daily.      Allergies:   Allopurinol, Aripiprazole, Bupropion, Buspirone, Desvenlafaxine, Duloxetine, Escitalopram, Fish oil, Fluticasone furoate, Hydroxyzine hcl, Nortriptyline, Paroxetine hcl, Ramipril, Sertraline, Venlafaxine, and Vilazodone   Social History   Socioeconomic History  . Marital status: Married    Spouse name: Not on file  . Number of children: Not on file  . Years of education: Not on file  . Highest education level: Not on file  Occupational History  . Not on file  Tobacco Use  . Smoking status: Former Smoker    Packs/day: 0.50    Years: 10.00    Pack years: 5.00  . Smokeless tobacco: Former Systems developer     Types: Snuff, Sarina Ser    Quit date: 11/20/1968  Vaping Use  . Vaping Use: Never used  Substance and Sexual Activity  . Alcohol use: No  . Drug use: No  . Sexual activity: Yes  Other Topics Concern  . Not on file  Social History Narrative  . Not on file   Social Determinants of Health   Financial Resource Strain:   . Difficulty of Paying Living Expenses: Not on file  Food Insecurity:   . Worried About Charity fundraiser in the Last Year: Not on file  . Ran Out of Food in the Last Year: Not on file  Transportation Needs:   . Lack of Transportation (Medical): Not on file  . Lack of Transportation (Non-Medical): Not on file  Physical Activity:   . Days of Exercise per Week: Not on file  . Minutes of Exercise per Session: Not on file  Stress:   . Feeling of Stress : Not on  file  Social Connections:   . Frequency of Communication with Friends and Family: Not on file  . Frequency of Social Gatherings with Friends and Family: Not on file  . Attends Religious Services: Not on file  . Active Member of Clubs or Organizations: Not on file  . Attends Archivist Meetings: Not on file  . Marital Status: Not on file     Family History: The patient's family history includes Cancer in his mother; Diabetes in his father; Heart attack in his father; Hypertension in his brother, father, and mother; Irregular heart beat in his brother.  ROS:   Please see the history of present illness.    All other systems reviewed and are negative.  EKGs/Labs/Other Studies Reviewed:    The following studies were reviewed today: IMPRESSIONS    1. Left ventricular ejection fraction, by estimation, is 35 to 40%. The  left ventricle has moderately decreased function. The left ventricle has  no regional wall motion abnormalities. Left ventricular diastolic function  could not be evaluated.  2. Left atrial size was moderate to severe.  3. The mitral valve is normal in structure. Mild mitral valve   regurgitation. No evidence of mitral stenosis.  4. 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    Study Highlights    The left ventricular ejection fraction is severely decreased (<30%).  Nuclear stress EF: 21%.  Findings consistent with prior myocardial infarction.  This is a high risk study.   Small inferior basal wall infarct no ischemia Study deemed high risk  Based on EF estimated at 21%.  Suggest echo / MRI correlation since Patient is in afib    Recent Labs: No results found for requested labs within last 8760 hours.  Recent Lipid Panel    Component Value Date/Time   CHOL  02/10/2011 0700    93        ATP III CLASSIFICATION:  <200     mg/dL   Desirable  200-239  mg/dL   Borderline High  >=240    mg/dL   High          TRIG 149 02/10/2011 0700   HDL 30 (L) 02/10/2011 0700   CHOLHDL 3.1 02/10/2011 0700   VLDL 30 02/10/2011 0700   LDLCALC  02/10/2011 0700    33        Total Cholesterol/HDL:CHD Risk Coronary Heart Disease Risk Table                     Men   Women  1/2 Average Risk   3.4   3.3  Average Risk       5.0   4.4  2 X Average Risk   9.6   7.1  3 X Average Risk  23.4   11.0        Use the calculated Patient Ratio above and the CHD Risk Table to determine the patient's CHD Risk.        ATP III CLASSIFICATION (LDL):  <100     mg/dL   Optimal  100-129  mg/dL   Near or Above                    Optimal  130-159  mg/dL   Borderline  160-189  mg/dL   High  >190     mg/dL   Very High    Physical Exam:    VS:  BP 137/90  Pulse 88   Ht 6\' 1"  (1.854 m)   Wt 228 lb 9.6 oz (103.7 kg)   SpO2 94%   BMI 30.16 kg/m     Wt Readings from Last 3 Encounters:  08/12/20 228 lb 9.6 oz (103.7 kg)  07/23/20 232 lb (105.2 kg)  07/16/20 232 lb 1.9 oz (105.3 kg)     GEN: Patient is in no acute distress HEENT: Normal NECK: No JVD; No carotid bruits LYMPHATICS: No lymphadenopathy CARDIAC: Hear sounds regular, 2/6  systolic murmur at the apex. RESPIRATORY:  Clear to auscultation without rales, wheezing or rhonchi  ABDOMEN: Soft, non-tender, non-distended MUSCULOSKELETAL:  No edema; No deformity  SKIN: Warm and dry NEUROLOGIC:  Alert and oriented x 3 PSYCHIATRIC:  Normal affect   Signed, Jenean Lindau, MD  08/12/2020 1:26 PM    [facility]

## 2020-08-13 LAB — BASIC METABOLIC PANEL
BUN/Creatinine Ratio: 18 (ref 10–24)
BUN: 25 mg/dL (ref 8–27)
CO2: 30 mmol/L — ABNORMAL HIGH (ref 20–29)
Calcium: 9.6 mg/dL (ref 8.6–10.2)
Chloride: 101 mmol/L (ref 96–106)
Creatinine, Ser: 1.41 mg/dL — ABNORMAL HIGH (ref 0.76–1.27)
GFR calc Af Amer: 59 mL/min/{1.73_m2} — ABNORMAL LOW (ref >59)
GFR calc non Af Amer: 51 mL/min/{1.73_m2} — ABNORMAL LOW (ref >59)
Glucose: 144 mg/dL — ABNORMAL HIGH (ref 65–99)
Potassium: 4.2 mmol/L (ref 3.5–5.2)
Sodium: 144 mmol/L (ref 134–144)

## 2020-08-13 NOTE — Telephone Encounter (Signed)
Continue chlorthalidone.  ?

## 2020-08-13 NOTE — Telephone Encounter (Signed)
How do you advise?

## 2020-08-17 ENCOUNTER — Ambulatory Visit (INDEPENDENT_AMBULATORY_CARE_PROVIDER_SITE_OTHER): Payer: Medicare Other | Admitting: Orthopaedic Surgery

## 2020-08-17 ENCOUNTER — Encounter: Payer: Self-pay | Admitting: Orthopaedic Surgery

## 2020-08-17 ENCOUNTER — Ambulatory Visit: Payer: Self-pay

## 2020-08-17 DIAGNOSIS — M25562 Pain in left knee: Secondary | ICD-10-CM | POA: Diagnosis not present

## 2020-08-17 DIAGNOSIS — E119 Type 2 diabetes mellitus without complications: Secondary | ICD-10-CM

## 2020-08-17 NOTE — Progress Notes (Signed)
Office Visit Note   Patient: [name] [name]           Date of Birth: [birth year]           MRN: [id number] Visit Date: 08/17/2020              Requested by: Raeanne Gathers, MD [address] [place],  [place] [address] PCP: Raeanne Gathers, MD   Assessment & Plan: Visit Diagnoses:  1. Type 2 diabetes mellitus without complication, unspecified whether long term insulin use ([place])   2. Left knee pain, unspecified chronicity     Plan: Dr. Ninfa Linden and I spoke with the patient at length that there is a hard stop with a hemoglobin A1c needed to be 7.8 lower.  However he was given a cortisone injection prior to the hemoglobin A1c checked and reports that his glucose levels have been under good control since then.  We will work on repeating his hemoglobin A1c and call him with the results.  If his hemoglobin A1c is below 7.8 we will work on scheduling surgery.  Surgery scheduler's card is given.  Questions were encouraged and answered at length.  He understands risk benefits of surgery as he is undergoing a right total knee arthroplasty in the past.  He will need to stop his Plavix 7 days prior to surgery and this was discussed with the patient today.  Follow-Up Instructions: No follow-ups on file.   Orders:  Orders Placed This Encounter  Procedures  . XR KNEE 3 VIEW LEFT  . HgB A1c   No orders of the defined types were placed in this encounter.     Procedures: No procedures performed   Clinical Data: No additional findings.   Subjective: Chief Complaint  Patient presents with  . Knee Pain    HPI 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  He underwent a right total knee arthroplasty December 2019 by Dr. Ninfa Linden and this needs done well.  The apparently he had not seen another orthopedic group in town as he was told they can do knee replacements on outpatient basis and patient preferred to do this on outpatient  basis.  However he reports that he was scheduled for surgery in September and right before his hemoglobin A1c was checked he was given a steroid shot for what sounds like de Quervain's.  He reports that his hemoglobin A1c usually runs around 7.0 but it bumped up to 8.1 and therefore surgery was canceled.  Patient also has cardiac disease on Plavix and has been cleared by his cardiologist and cleared to stop Plavix prior to knee surgery.  Patient is upset that his surgery was canceled by the other orthopedic office and the fact that he was just called to cancel it is no significant explanation was given.  Review of Systems  Constitutional: Negative for chills and fever.  Respiratory: Negative for shortness of breath.   Cardiovascular: Negative for chest pain.  Musculoskeletal: Positive for arthralgias.     Objective: Vital Signs: There were no vitals taken for this visit.  Physical Exam Constitutional:      Appearance: He is not ill-appearing or diaphoretic.  Pulmonary:     Effort: Pulmonary effort is normal.  Neurological:     Mental Status: He is alert and oriented to person, place, and time.  Psychiatric:        Mood and Affect: Mood normal.     Ortho Exam Right knee  full extension flexion to approximately 105 110 degrees.  Surgical incisions well-healed. Left knee full extension flexion to 110 degrees.  No instability valgus varus stressing left calf supple nontender. Specialty Comments:  No specialty comments available.  Imaging: XR KNEE 3 VIEW LEFT  Result Date: 08/17/2020 Left knee AP lateral views shows end-stage arthritis with bone-on-bone medial compartment.  Moderate patellofemoral changes.  Mild lateral compartmental changes.  Chondrocalcinosis.  Arthrosclerosis of the popliteal vessels noted.  No acute fractures.    PMFS History: Patient Active Problem List   Diagnosis Date Noted  . Renal insufficiency 08/12/2020  . Ischemic cardiomyopathy 08/12/2020  . CAD  (coronary artery disease)   . DM (diabetes mellitus) ([place])   . HTN (hypertension)   . Hyperlipidemia   . Diabetes mellitus due to underlying condition with unspecified complications ([place]) 95/07/3266  . Paroxysmal atrial fibrillation ([place]) 07/16/2020  . Erythrocytosis 06/01/2020  . Unilateral primary osteoarthritis, right knee 11/15/2018  . 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 Depressive disorder 11/12/2018  . 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 History of colonic polyps 11/12/2018  . History of nephrolithiasis 11/12/2018  . History of smoking 11/12/2018  . 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 Medication intolerance 11/12/2018  . Old myocardial infarction 11/12/2018  . Mixed hyperlipidemia 11/12/2018  . Pre-operative cardiovascular examination 11/12/2018  . CAD (coronary artery disease), native coronary artery 11/12/2018  . Bilateral leg edema 05/04/2017  . Primary osteoarthritis of both knees 03/05/2017  . CAP (community acquired pneumonia) 08/17/2016  . Right carotid bruit 08/05/2014  . HYPERTENSION, BENIGN 06/15/2009  . CAD, NATIVE VESSEL 06/15/2009  . HYPERLIPIDEMIA-MIXED 10/14/2007  . Type II or unspecified type diabetes mellitus without mention of complication, not stated as uncontrolled 10/14/2007  . Gout, unspecified 10/14/2007  . Essential hypertension 10/14/2007   Past Medical History:  Diagnosis Date  . Bilateral leg edema 05/04/2017  . 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 CAD, NATIVE VESSEL 06/15/2009   Qualifier: Diagnosis of  By: Haroldine Laws, MD, Eileen Stanford Depressive disorder 11/12/2018  . Diabetes mellitus due to underlying condition with unspecified complications ([place]) 12/14/5807  . DM (diabetes mellitus) ([place])   . ED (erectile dysfunction) of organic  origin 11/12/2018  . History of nephrolithiasis 11/12/2018  . HTN (hypertension)   . Hyperlipidemia   . HYPERTENSION, BENIGN 06/15/2009  . 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 Medication intolerance 11/12/2018  . Mixed hyperlipidemia 11/12/2018  . Old myocardial infarction 11/12/2018  . Paroxysmal atrial fibrillation ([place]) 07/16/2020  . Pre-operative cardiovascular examination 11/12/2018  . Right carotid bruit 08/05/2014  . 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 Unilateral primary osteoarthritis, right knee 11/15/2018    Family History  Problem Relation Age of Onset  . Hypertension Mother   . Cancer Mother   . Heart attack Father   . Hypertension Father   . Diabetes Father   . Hypertension Brother   . Irregular heart beat Brother     Past Surgical History:  Procedure Laterality Date  . CORONARY ANGIOPLASTY WITH STENT PLACEMENT    . LAPAROSCOPIC INGUINAL HERNIA REPAIR    . TOTAL KNEE ARTHROPLASTY Right 11/15/2018   Procedure: RIGHT TOTAL KNEE ARTHROPLASTY;  Surgeon: Mcarthur Rossetti,  MD;  Location: [facility];  Service: Orthopedics;  Laterality: Right;   Social History   Occupational History  . Not on file  Tobacco Use  . Smoking status: Former Smoker    Packs/day: 0.50    Years: 10.00    Pack years: 5.00  . Smokeless tobacco: Former Systems developer    Types: Snuff, Sarina Ser    Quit date: 11/20/1968  Vaping Use  . Vaping Use: Never used  Substance and Sexual Activity  . Alcohol use: No  . Drug use: No  . Sexual activity: Yes

## 2020-08-18 LAB — HEMOGLOBIN A1C
Hgb A1c MFr Bld: 8.3 % of total Hgb — ABNORMAL HIGH (ref <5.7)
Mean Plasma Glucose: 192 (calc)
eAG (mmol/L): 10.6 (calc)

## 2020-08-18 LAB — EXTRA SPECIMEN

## 2020-08-19 NOTE — Telephone Encounter (Signed)
How do you advise?

## 2020-08-19 NOTE — Telephone Encounter (Signed)
Sent message to pt in [place].

## 2020-08-19 NOTE — Telephone Encounter (Signed)
Stop entresto now and we will revisit this later.. Start telmisartan beginning saturday

## 2020-08-21 ENCOUNTER — Encounter: Payer: Self-pay | Admitting: Orthopaedic Surgery

## 2020-08-23 ENCOUNTER — Telehealth: Payer: Self-pay | Admitting: Orthopaedic Surgery

## 2020-08-23 NOTE — Telephone Encounter (Signed)
We can certainly do a lab draw on him in 4 weeks as a regular appointment.

## 2020-08-23 NOTE — Telephone Encounter (Signed)
Pt called wanting to know if he could come here for his lab work? Pt is trying to check his A1C  [phone]

## 2020-08-23 NOTE — Telephone Encounter (Signed)
Will call back to schedule.

## 2020-08-24 ENCOUNTER — Telehealth: Payer: Self-pay | Admitting: Orthopaedic Surgery

## 2020-08-24 NOTE — Telephone Encounter (Signed)
Patient called requesting a call back. Patient asking for an appt for blood work nurse visit. Please call patient at [phone].

## 2020-08-24 NOTE — Telephone Encounter (Signed)
He just needs to make a regular appt with Ninfa Linden for a visit and we will draw his blood at that time

## 2020-08-24 NOTE — Telephone Encounter (Signed)
Pt called asking Caryl Pina give him a CB  [phone]

## 2020-08-26 ENCOUNTER — Other Ambulatory Visit: Payer: Self-pay | Admitting: Cardiology

## 2020-08-26 DIAGNOSIS — I10 Essential (primary) hypertension: Secondary | ICD-10-CM | POA: Diagnosis not present

## 2020-08-27 LAB — BASIC METABOLIC PANEL
BUN/Creatinine Ratio: 17 (ref 10–24)
BUN: 25 mg/dL (ref 8–27)
CO2: 29 mmol/L (ref 20–29)
Calcium: 9.4 mg/dL (ref 8.6–10.2)
Chloride: 108 mmol/L — ABNORMAL HIGH (ref 96–106)
Creatinine, Ser: 1.47 mg/dL — ABNORMAL HIGH (ref 0.76–1.27)
GFR calc Af Amer: 56 mL/min/{1.73_m2} — ABNORMAL LOW (ref >59)
GFR calc non Af Amer: 49 mL/min/{1.73_m2} — ABNORMAL LOW (ref >59)
Glucose: 82 mg/dL (ref 65–99)
Potassium: 3.9 mmol/L (ref 3.5–5.2)
Sodium: 149 mmol/L — ABNORMAL HIGH (ref 134–144)

## 2020-09-06 ENCOUNTER — Telehealth: Payer: Self-pay | Admitting: Cardiology

## 2020-09-06 NOTE — Telephone Encounter (Signed)
Pt verbalized understanding of his lab results... he will keep his follow up 09/22/20.

## 2020-09-06 NOTE — Telephone Encounter (Signed)
Pt is calling to speak with Dr. Julien Nordmann nurse. Please call back

## 2020-09-20 ENCOUNTER — Encounter: Payer: Self-pay | Admitting: Orthopaedic Surgery

## 2020-09-20 ENCOUNTER — Ambulatory Visit (INDEPENDENT_AMBULATORY_CARE_PROVIDER_SITE_OTHER): Payer: Medicare Other | Admitting: Orthopaedic Surgery

## 2020-09-20 DIAGNOSIS — E119 Type 2 diabetes mellitus without complications: Secondary | ICD-10-CM | POA: Diagnosis not present

## 2020-09-20 DIAGNOSIS — M1712 Unilateral primary osteoarthritis, left knee: Secondary | ICD-10-CM

## 2020-09-20 NOTE — Addendum Note (Signed)
Addended by: Jacklyn Shell on: 09/20/2020 02:39 PM   Modules accepted: Orders

## 2020-09-20 NOTE — Progress Notes (Signed)
The patient is here today for a check of his hemoglobin A1c.  He was scheduled by another group to undergo knee replacement surgery but they had to cancel surgery because a hemoglobin A1c was too high.  They did not give him any reason according to him and he was not happy with her follow-up.  He understands we cannot proceed with surgery until we know that the number is below 7.8.  He says his daily blood glucose has been running in the 80s and 90s in the morning and he is very pleased thus far.  He does have severe debilitating left knee pain.  We drew his hemoglobin A1c today.  As soon as we know its results we can make a surgical plan.  He understands this as well.  We will be in touch.

## 2020-09-21 ENCOUNTER — Other Ambulatory Visit: Payer: Self-pay

## 2020-09-21 LAB — HEMOGLOBIN A1C
Hgb A1c MFr Bld: 8 % of total Hgb — ABNORMAL HIGH (ref <5.7)
Mean Plasma Glucose: 183 (calc)
eAG (mmol/L): 10.1 (calc)

## 2020-09-21 LAB — EXTRA SPECIMEN

## 2020-09-22 ENCOUNTER — Encounter: Payer: Self-pay | Admitting: Cardiology

## 2020-09-22 ENCOUNTER — Ambulatory Visit: Payer: Medicare Other | Admitting: Cardiology

## 2020-09-22 ENCOUNTER — Telehealth: Payer: Self-pay

## 2020-09-22 ENCOUNTER — Other Ambulatory Visit: Payer: Self-pay

## 2020-09-22 VITALS — BP 125/77 | HR 88 | Ht 73.0 in | Wt 233.0 lb

## 2020-09-22 DIAGNOSIS — I2510 Atherosclerotic heart disease of native coronary artery without angina pectoris: Secondary | ICD-10-CM

## 2020-09-22 DIAGNOSIS — E782 Mixed hyperlipidemia: Secondary | ICD-10-CM | POA: Diagnosis not present

## 2020-09-22 DIAGNOSIS — I10 Essential (primary) hypertension: Secondary | ICD-10-CM | POA: Diagnosis not present

## 2020-09-22 DIAGNOSIS — I480 Paroxysmal atrial fibrillation: Secondary | ICD-10-CM

## 2020-09-22 DIAGNOSIS — E088 Diabetes mellitus due to underlying condition with unspecified complications: Secondary | ICD-10-CM

## 2020-09-22 DIAGNOSIS — I255 Ischemic cardiomyopathy: Secondary | ICD-10-CM | POA: Diagnosis not present

## 2020-09-22 MED ORDER — RIVAROXABAN 20 MG PO TABS: 20.0000 mg | ORAL_TABLET | Freq: Every day | ORAL | 6 refills | Status: DC

## 2020-09-22 NOTE — Telephone Encounter (Signed)
Pt is starting on Xarelto 20 mg daily. 4 bottles of samples given as per Dr. Geraldo Pitter request. Lot # [id number] Exp: 06/2022

## 2020-09-22 NOTE — Patient Instructions (Signed)
Medication Instructions:  Your physician has recommended you make the following change in your medication:  Stop Plavix. Start Xarelto 20 mg daily.  *If you need a refill on your cardiac medications before your next appointment, please call your pharmacy*   Lab Work: Your physician recommends that you have labs done in the office today. Your test included  basic metabolic panel, complete blood count, TSH, and liver function.  If you have labs (blood work) drawn today and your tests are completely normal, you will receive your results only by: [name] [name] MyChart Message (if you have MyChart) OR . A paper copy in the mail If you have any lab test that is abnormal or we need to change your treatment, we will call you to review the results.   Testing/Procedures: None ordered   Follow-Up: At [facility], you and your health needs are our priority.  As part of our continuing mission to provide you with exceptional heart care, we have created designated Provider Care Teams.  These Care Teams include your primary Cardiologist (physician) and Advanced Practice Providers (APPs -  Physician Assistants and Nurse Practitioners) who all work together to provide you with the care you need, when you need it.  We recommend signing up for the patient portal called "MyChart".  Sign up information is provided on this After Visit Summary.  MyChart is used to connect with patients for Virtual Visits (Telemedicine).  Patients are able to view lab/test results, encounter notes, upcoming appointments, etc.  Non-urgent messages can be sent to your provider as well.   To learn more about what you can do with MyChart, go to [email or web address].    Your next appointment:   3 month(s)  The format for your next appointment:   In Person  Provider:   Jyl Heinz, MD   Other Instructions Rivaroxaban oral tablets What is this medicine? RIVAROXABAN (ri va ROX a ban) is an anticoagulant (blood thinner). It is  used to treat blood clots in the lungs or in the veins. It is also used to prevent blood clots in the lungs or in the veins. It is also used to lower the chance of stroke in people with a medical condition called atrial fibrillation. This medicine may be used for other purposes; ask your health care provider or pharmacist if you have questions. COMMON BRAND NAME(S): Xarelto, Xarelto Starter Pack What should I tell my health care provider before I take this medicine? They need to know if you have any of these conditions:  antiphospholipid antibody syndrome  artificial heart valve  bleeding disorders  bleeding in the brain  blood in your stools (black or tarry stools) or if you have blood in your vomit  history of blood clots  history of stomach bleeding  kidney disease  liver disease  low blood counts, like low white cell, platelet, or red cell counts  recent or planned spinal or epidural procedure  take medicines that treat or prevent blood clots  an unusual or allergic reaction to rivaroxaban, other medicines, foods, dyes, or preservatives  pregnant or trying to get pregnant  breast-feeding How should I use this medicine? Take this medicine by mouth with a glass of water. Follow the directions on the prescription label. Take your medicine at regular intervals. Do not take it more often than directed. Do not stop taking except on your doctor's advice. Stopping this medicine may increase your risk of a blood clot. Be sure to refill your prescription before you run out  of medicine. If you are taking this medicine after hip or knee replacement surgery, take it with or without food. If you are taking this medicine for atrial fibrillation, take it with your evening meal. If you are taking this medicine to treat blood clots, take it with food at the same time each day. If you are unable to swallow your tablet, you may crush the tablet and mix it in applesauce. Then, immediately eat the  applesauce. You should eat more food right after you eat the applesauce containing the crushed tablet. Talk to your pediatrician regarding the use of this medicine in children. Special care may be needed. Overdosage: If you think you have taken too much of this medicine contact a poison control center or emergency room at once. NOTE: This medicine is only for you. Do not share this medicine with others. What if I miss a dose? If you take your medicine once a day and miss a dose, take the missed dose as soon as you remember. If it is almost time for your next dose, take only that dose. Do not take double or extra doses. If you take your medicine twice a day and miss a dose, take the missed dose immediately. In this instance, 2 tablets may be taken at the same time. The next day you should take 1 tablet twice a day as directed. What may interact with this medicine? Do not take this medicine with any of the following medications:  defibrotide This medicine may also interact with the following medications:  aspirin and aspirin-like medicines  certain antibiotics like erythromycin, azithromycin, and clarithromycin  certain medicines for fungal infections like ketoconazole and itraconazole  certain medicines for irregular heart beat like amiodarone, quinidine, dronedarone  certain medicines for seizures like carbamazepine, phenytoin  certain medicines that treat or prevent blood clots like warfarin, enoxaparin, and dalteparin  conivaptan  felodipine  indinavir  lopinavir; ritonavir  NSAIDS, medicines for pain and inflammation, like ibuprofen or naproxen  ranolazine  rifampin  ritonavir  SNRIs, medicines for depression, like desvenlafaxine, duloxetine, levomilnacipran, venlafaxine  SSRIs, medicines for depression, like citalopram, escitalopram, fluoxetine, fluvoxamine, paroxetine, sertraline  St. John's wort  verapamil This list may not describe all possible interactions.  Give your health care provider a list of all the medicines, herbs, non-prescription drugs, or dietary supplements you use. Also tell them if you smoke, drink alcohol, or use illegal drugs. Some items may interact with your medicine. What should I watch for while using this medicine? Visit your healthcare professional for regular checks on your progress. You may need blood work done while you are taking this medicine. Your condition will be monitored carefully while you are receiving this medicine. It is important not to miss any appointments. Avoid sports and activities that might cause injury while you are using this medicine. Severe falls or injuries can cause unseen bleeding. Be careful when using sharp tools or knives. Consider using an Copy. Take special care brushing or flossing your teeth. Report any injuries, bruising, or red spots on the skin to your healthcare professional. If you are going to need surgery or other procedure, tell your healthcare professional that you are taking this medicine. Wear a medical ID bracelet or chain. Carry a card that describes your disease and details of your medicine and dosage times. What side effects may I notice from receiving this medicine? Side effects that you should report to your doctor or health care professional as soon as possible:  allergic  reactions like skin rash, itching or hives, swelling of the face, lips, or tongue  back pain  redness, blistering, peeling or loosening of the skin, including inside the mouth  signs and symptoms of bleeding such as bloody or black, tarry stools; red or dark-brown urine; spitting up blood or brown material that looks like coffee grounds; red spots on the skin; unusual bruising or bleeding from the eye, gums, or nose  signs and symptoms of a blood clot such as chest pain; shortness of breath; pain, swelling, or warmth in the leg  signs and symptoms of a stroke such as changes in vision; confusion;  trouble speaking or understanding; severe headaches; sudden numbness or weakness of the face, arm or leg; trouble walking; dizziness; loss of coordination Side effects that usually do not require medical attention (report to your doctor or health care professional if they continue or are bothersome):  dizziness  muscle pain This list may not describe all possible side effects. Call your doctor for medical advice about side effects. You may report side effects to FDA at 1-800-FDA-1088. Where should I keep my medicine? Keep out of the reach of children. Store at room temperature between 15 and 30 degrees C (59 and 86 degrees F). Throw away any unused medicine after the expiration date. NOTE: This sheet is a summary. It may not cover all possible information. If you have questions about this medicine, talk to your doctor, pharmacist, or health care provider.  2020 Elsevier/Gold Standard (2019-02-03 09:45:59)

## 2020-09-22 NOTE — Progress Notes (Signed)
Cardiology Office Note:    Date:  09/22/2020   ID:  [name] [name], DOB 27-May-1951, MRN [id number]  PCP:  Raeanne Gathers, MD  Cardiologist:  Jenean Lindau, MD   Referring MD: Raeanne Gathers, MD    ASSESSMENT:    1. Coronary artery disease involving native coronary artery of native heart without angina pectoris   2. Essential hypertension   3. Mixed hyperlipidemia   4. Ischemic cardiomyopathy   5. Diabetes mellitus due to underlying condition with unspecified complications ([place])   6. Paroxysmal atrial fibrillation (HCC)    PLAN:    In order of problems listed above:  1. Coronary artery disease: Secondary prevention stressed with patient.  Importance of compliance with diet medication stressed and vocalized understanding.  He cannot exercise because of pending knee replacement. 2. Essential hypertension: Blood pressure is stable and diet was emphasized. 3. Mixed dyslipidemia: Diet was emphasized.  He is on statin therapy.  He is following diet meticulously. 4. Ischemic cardiomyopathy: He could not tolerate Entresto.  He does not want to try any other medications including Farxiga and I did lengthy discussion with him about this. 5. Paroxysmal atrial fibrillation:I discussed with the patient atrial fibrillation, disease process. Management and therapy including rate and rhythm control, anticoagulation benefits and potential risks were discussed extensively with the patient. Patient had multiple questions which were answered to patient's satisfaction.  He had multiple questions.  I answered them.  Benefits and risks explained.  He will have Chem-7 CBC TSH and liver panel today.  I will start him on Xarelto 20 mg daily.  He understands benefits and risks in detail.  I will stop his Plavix.  He will continue aspirin.   6. Patient will be seen in follow-up appointment in 6 months or earlier if the patient has any concerns    Medication Adjustments/Labs and Tests Ordered: Current  medicines are reviewed at length with the patient today.  Concerns regarding medicines are outlined above.  No orders of the defined types were placed in this encounter.  No orders of the defined types were placed in this encounter.    No chief complaint on file.    History of Present Illness:    [name] [name] is a 69 y.o. male.  Patient has past medical history of coronary artery disease post coronary stenting in the past, essential hypertension dyslipidemia and diabetes mellitus.  He was contemplating the surgery but this has been postponed because of hemoglobin A1c greater than 8.  He denies any chest pain orthopnea or PND.  He has paroxysmal atrial fibrillation.  He is not on anticoagulation because he was contemplating surgery.  His surgery is now postponed till his hemoglobin A1c is satisfactory.  At the time of my evaluation, the patient is alert awake oriented and in no distress.  Past Medical History:  Diagnosis Date  . Bilateral leg edema 05/04/2017  . 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 CAD (coronary artery disease), native coronary artery 11/12/2018   Formatting of this note might be different from the original. S/p MI - Dr. Maurene Capes  . CAD, NATIVE VESSEL 06/15/2009   Qualifier: Diagnosis of  By: Haroldine Laws, MD, Eileen Stanford CAP (community acquired pneumonia) 08/17/2016  . Depressive disorder 11/12/2018  . Diabetes mellitus due to underlying condition with unspecified complications ([place]) 8/50/2774  . DM (  diabetes mellitus) ([place])   . 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 Erythrocytosis 06/01/2020  . Essential hypertension 10/14/2007   Formatting of this note might be different from the original. Hypertension  10/1 IMO update  . Gout, unspecified 10/14/2007   Overview:  Gout  10/1 IMO update  Formatting of this note might be different from the original. Gout   10/1 IMO update  . History of colonic polyps 11/12/2018   Overview:  rpt colonoscopy 5/17; Dr. Haig Prophet of this note might be different from the original. rpt colonoscopy 5/17; Dr. Erlene Quan  . History of nephrolithiasis 11/12/2018  . History of smoking 11/12/2018   Overview:  quit 2000  Formatting of this note might be different from the original. quit 2000  . HTN (hypertension)   . Hyperlipidemia   . HYPERLIPIDEMIA-MIXED 10/14/2007   Qualifier: Diagnosis of  By: Haroldine Laws, MD, Willette Cluster of this note might be different from the original. Hyperlipidemia  . HYPERTENSION, BENIGN 06/15/2009  . 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 Ischemic cardiomyopathy 08/12/2020  . Medication intolerance 11/12/2018  . Mixed hyperlipidemia 11/12/2018  . Old myocardial infarction 11/12/2018  . Paroxysmal atrial fibrillation ([place]) 07/16/2020  . Pre-operative cardiovascular examination 11/12/2018  . Primary osteoarthritis of both knees 03/05/2017   Considering Zilretta injections. Good improvement with corticosteroid injections however short-lived. Mild improvement with Visco supplementation.  . Renal insufficiency 08/12/2020  . Right carotid bruit 08/05/2014  . 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 Type II or unspecified type diabetes mellitus without mention of complication, not stated as uncontrolled 10/14/2007   Formatting of this note might be different from the original. Diabetes Mellitus  . Unilateral primary osteoarthritis, right knee 11/15/2018    Past Surgical History:  Procedure Laterality Date  . CORONARY ANGIOPLASTY WITH STENT PLACEMENT    . LAPAROSCOPIC INGUINAL HERNIA REPAIR    . TOTAL KNEE ARTHROPLASTY Right 11/15/2018   Procedure: RIGHT TOTAL KNEE ARTHROPLASTY;  Surgeon: Mcarthur Rossetti, MD;  Location: [facility];  Service: Orthopedics;  Laterality: Right;    Current  Medications: Current Meds  Medication Sig  . ALPRAZolam (XANAX) 0.25 MG tablet Take 0.25 mg by mouth at bedtime as needed for anxiety.   Marland Kitchen aspirin 81 MG tablet Take 81 mg by mouth at bedtime.   Marland Kitchen CIALIS 20 MG tablet Take 20 mg by mouth daily as needed for erectile dysfunction.   . clopidogrel (PLAVIX) 75 MG tablet Take 1 tablet (75 mg total) by mouth daily.  . cyanocobalamin (,VITAMIN B-12,) 1000 MCG/ML injection Inject 1,000 mcg into the muscle every 30 (thirty) days.   Marland Kitchen FREESTYLE INSULINX TEST test strip   . furosemide (LASIX) 20 MG tablet Take 20 mg by mouth as needed.   Marland Kitchen glipiZIDE (GLUCOTROL XL) 10 MG 24 hr tablet Take 10 mg by mouth at bedtime.  . Lancets (FREESTYLE) lancets   . LANTUS SOLOSTAR 100 UNIT/ML Solostar Pen Inject 25 Units into the skin at bedtime.  . levalbuterol (XOPENEX HFA) 45 MCG/ACT inhaler Inhale 1 puff into the lungs every 4 (four) hours as needed for wheezing.   . rosuvastatin (CRESTOR) 20 MG tablet Take 20 mg by mouth at bedtime.   Marland Kitchen telmisartan (MICARDIS) 80 MG tablet Take 40 mg by mouth daily.      Allergies:   Allopurinol, Aripiprazole, Bupropion, Buspirone, Desvenlafaxine, Duloxetine, Escitalopram, Fish oil, Fluticasone furoate, Hydroxyzine hcl, Nortriptyline, Paroxetine  hcl, Ramipril, Sertraline, Venlafaxine, and Vilazodone   Social History   Socioeconomic History  . Marital status: Married    Spouse name: Not on file  . Number of children: Not on file  . Years of education: Not on file  . Highest education level: Not on file  Occupational History  . Not on file  Tobacco Use  . Smoking status: Former Smoker    Packs/day: 0.50    Years: 10.00    Pack years: 5.00  . Smokeless tobacco: Former Systems developer    Types: Snuff, Sarina Ser    Quit date: 11/20/1968  Vaping Use  . Vaping Use: Never used  Substance and Sexual Activity  . Alcohol use: No  . Drug use: No  . Sexual activity: Yes  Other Topics Concern  . Not on file  Social History Narrative  . Not on  file   Social Determinants of Health   Financial Resource Strain:   . Difficulty of Paying Living Expenses: Not on file  Food Insecurity:   . Worried About Charity fundraiser in the Last Year: Not on file  . Ran Out of Food in the Last Year: Not on file  Transportation Needs:   . Lack of Transportation (Medical): Not on file  . Lack of Transportation (Non-Medical): Not on file  Physical Activity:   . Days of Exercise per Week: Not on file  . Minutes of Exercise per Session: Not on file  Stress:   . Feeling of Stress : Not on file  Social Connections:   . Frequency of Communication with Friends and Family: Not on file  . Frequency of Social Gatherings with Friends and Family: Not on file  . Attends Religious Services: Not on file  . Active Member of Clubs or Organizations: Not on file  . Attends Archivist Meetings: Not on file  . Marital Status: Not on file     Family History: The patient's family history includes Cancer in his mother; Diabetes in his father; Heart attack in his father; Hypertension in his brother, father, and mother; Irregular heart beat in his brother.  ROS:   Please see the history of present illness.    All other systems reviewed and are negative.  EKGs/Labs/Other Studies Reviewed:    The following studies were reviewed today: I discussed this with the patient at length.   Recent Labs: 08/26/2020: BUN 25; Creatinine, Ser 1.47; Potassium 3.9; Sodium 149  Recent Lipid Panel    Component Value Date/Time   CHOL  02/10/2011 0700    93        ATP III CLASSIFICATION:  <200     mg/dL   Desirable  200-239  mg/dL   Borderline High  >=240    mg/dL   High          TRIG 149 02/10/2011 0700   HDL 30 (L) 02/10/2011 0700   CHOLHDL 3.1 02/10/2011 0700   VLDL 30 02/10/2011 0700   LDLCALC  02/10/2011 0700    33        Total Cholesterol/HDL:CHD Risk Coronary Heart Disease Risk Table                     Men   Women  1/2 Average Risk   3.4   3.3   Average Risk       5.0   4.4  2 X Average Risk   9.6   7.1  3 X Average Risk  23.4  11.0        Use the calculated Patient Ratio above and the CHD Risk Table to determine the patient's CHD Risk.        ATP III CLASSIFICATION (LDL):  <100     mg/dL   Optimal  100-129  mg/dL   Near or Above                    Optimal  130-159  mg/dL   Borderline  160-189  mg/dL   High  >190     mg/dL   Very High    Physical Exam:    VS:  BP 125/77   Pulse 88   Ht 6\' 1"  (1.854 m)   Wt 233 lb 0.6 oz (105.7 kg)   SpO2 98%   BMI 30.75 kg/m     Wt Readings from Last 3 Encounters:  09/22/20 233 lb 0.6 oz (105.7 kg)  08/12/20 228 lb 9.6 oz (103.7 kg)  07/23/20 232 lb (105.2 kg)     GEN: Patient is in no acute distress HEENT: Normal NECK: No JVD; No carotid bruits LYMPHATICS: No lymphadenopathy CARDIAC: Hear sounds regular, 2/6 systolic murmur at the apex. RESPIRATORY:  Clear to auscultation without rales, wheezing or rhonchi  ABDOMEN: Soft, non-tender, non-distended MUSCULOSKELETAL:  No edema; No deformity  SKIN: Warm and dry NEUROLOGIC:  Alert and oriented x 3 PSYCHIATRIC:  Normal affect   Signed, Jenean Lindau, MD  09/22/2020 2:55 PM    [place]

## 2020-09-23 LAB — CBC WITH DIFFERENTIAL/PLATELET
Basophils Absolute: 0.1 10*3/uL (ref 0.0–0.2)
Basos: 1 %
EOS (ABSOLUTE): 0.1 10*3/uL (ref 0.0–0.4)
Eos: 2 %
Hematocrit: 52.2 % — ABNORMAL HIGH (ref 37.5–51.0)
Hemoglobin: 16.6 g/dL (ref 13.0–17.7)
Immature Grans (Abs): 0 10*3/uL (ref 0.0–0.1)
Immature Granulocytes: 0 %
Lymphocytes Absolute: 1 10*3/uL (ref 0.7–3.1)
Lymphs: 16 %
MCH: 27.1 pg (ref 26.6–33.0)
MCHC: 31.8 g/dL (ref 31.5–35.7)
MCV: 85 fL (ref 79–97)
Monocytes Absolute: 0.6 10*3/uL (ref 0.1–0.9)
Monocytes: 8 %
Neutrophils Absolute: 4.9 10*3/uL (ref 1.4–7.0)
Neutrophils: 73 %
Platelets: 124 10*3/uL — ABNORMAL LOW (ref 150–450)
RBC: 6.12 x10E6/uL — ABNORMAL HIGH (ref 4.14–5.80)
RDW: 13.6 % (ref 11.6–15.4)
WBC: 6.7 10*3/uL (ref 3.4–10.8)

## 2020-09-23 LAB — BASIC METABOLIC PANEL
BUN/Creatinine Ratio: 17 (ref 10–24)
BUN: 27 mg/dL (ref 8–27)
CO2: 27 mmol/L (ref 20–29)
Calcium: 9.7 mg/dL (ref 8.6–10.2)
Chloride: 100 mmol/L (ref 96–106)
Creatinine, Ser: 1.55 mg/dL — ABNORMAL HIGH (ref 0.76–1.27)
GFR calc Af Amer: 53 mL/min/{1.73_m2} — ABNORMAL LOW (ref >59)
GFR calc non Af Amer: 46 mL/min/{1.73_m2} — ABNORMAL LOW (ref >59)
Glucose: 156 mg/dL — ABNORMAL HIGH (ref 65–99)
Potassium: 4 mmol/L (ref 3.5–5.2)
Sodium: 139 mmol/L (ref 134–144)

## 2020-09-23 LAB — HEPATIC FUNCTION PANEL
ALT: 17 IU/L (ref 0–44)
AST: 26 IU/L (ref 0–40)
Albumin: 4.5 g/dL (ref 3.8–4.8)
Alkaline Phosphatase: 108 IU/L (ref 44–121)
Bilirubin Total: 1.1 mg/dL (ref 0.0–1.2)
Bilirubin, Direct: 0.45 mg/dL — ABNORMAL HIGH (ref 0.00–0.40)
Total Protein: 7 g/dL (ref 6.0–8.5)

## 2020-09-23 LAB — TSH: TSH: 3.37 u[IU]/mL (ref 0.450–4.500)

## 2020-09-29 NOTE — Telephone Encounter (Signed)
We stopped his Plavix at his last appointment and started him on Xarelto.

## 2020-10-19 DIAGNOSIS — D751 Secondary polycythemia: Secondary | ICD-10-CM | POA: Diagnosis not present

## 2020-10-19 DIAGNOSIS — D696 Thrombocytopenia, unspecified: Secondary | ICD-10-CM | POA: Diagnosis not present

## 2020-10-19 DIAGNOSIS — Z794 Long term (current) use of insulin: Secondary | ICD-10-CM | POA: Diagnosis not present

## 2020-10-19 DIAGNOSIS — E1165 Type 2 diabetes mellitus with hyperglycemia: Secondary | ICD-10-CM | POA: Diagnosis not present

## 2020-10-20 ENCOUNTER — Other Ambulatory Visit: Payer: Self-pay | Admitting: Orthopaedic Surgery

## 2020-10-20 ENCOUNTER — Ambulatory Visit (INDEPENDENT_AMBULATORY_CARE_PROVIDER_SITE_OTHER): Payer: Medicare Other | Admitting: Orthopaedic Surgery

## 2020-10-20 ENCOUNTER — Encounter: Payer: Self-pay | Admitting: Orthopaedic Surgery

## 2020-10-20 VITALS — Ht 73.0 in | Wt 231.2 lb

## 2020-10-20 DIAGNOSIS — M1712 Unilateral primary osteoarthritis, left knee: Secondary | ICD-10-CM | POA: Diagnosis not present

## 2020-10-20 DIAGNOSIS — M25562 Pain in left knee: Secondary | ICD-10-CM

## 2020-10-20 DIAGNOSIS — E088 Diabetes mellitus due to underlying condition with unspecified complications: Secondary | ICD-10-CM | POA: Diagnosis not present

## 2020-10-20 NOTE — Progress Notes (Signed)
The patient comes in today to have a hemoglobin A1c checked.  He is a diabetic and now reports he is under much better control.  However, just 1 month ago his hemoglobin A1c was 8.0.  He does report that his daily blood glucose is low.  He is in need of a left total knee arthroplasty.  We are having to delay surgery due to his blood glucose being out of control before.  Hopefully this is going to be improved so we can proceed with scheduling him for a left knee replacement.  We did draw his blood today.  I will call him with the results and we will schedule his surgery appropriately once we are able to proceed safely.  He understands this as well.  All questions and concerns were answered and addressed.  He is on Xarelto and I will have him stop it 4 days before surgery and restart it the day after surgery.

## 2020-10-20 NOTE — Addendum Note (Signed)
Addended by: Jacklyn Shell on: 10/20/2020 10:52 AM   Modules accepted: Orders

## 2020-10-21 DIAGNOSIS — I10 Essential (primary) hypertension: Secondary | ICD-10-CM | POA: Diagnosis not present

## 2020-10-21 DIAGNOSIS — Z79899 Other long term (current) drug therapy: Secondary | ICD-10-CM | POA: Diagnosis not present

## 2020-10-21 DIAGNOSIS — E119 Type 2 diabetes mellitus without complications: Secondary | ICD-10-CM | POA: Diagnosis not present

## 2020-10-21 LAB — HEMOGLOBIN A1C
Hgb A1c MFr Bld: 7.3 % of total Hgb — ABNORMAL HIGH (ref <5.7)
Mean Plasma Glucose: 163 (calc)
eAG (mmol/L): 9 (calc)

## 2020-10-21 LAB — HOUSE ACCOUNT TRACKING

## 2020-10-25 ENCOUNTER — Telehealth: Payer: Self-pay | Admitting: Orthopaedic Surgery

## 2020-10-25 NOTE — Telephone Encounter (Signed)
Pt called stating he would like a CB in regards to his lab work; pt states his PCP read them but he would like to discuss a care plan with DR.Ninfa Linden.   [phone]

## 2020-10-25 NOTE — Telephone Encounter (Signed)
LMOM with [facility] that we still don't have his results in and to fax them to [place]

## 2020-10-26 NOTE — Telephone Encounter (Signed)
I have printed results and put on Ashley's desk for Dr. Ninfa Linden to review

## 2020-10-27 NOTE — Telephone Encounter (Signed)
Dr. Ninfa Linden has the results

## 2020-10-28 NOTE — Telephone Encounter (Signed)
Did you see these yet?

## 2020-10-28 NOTE — Telephone Encounter (Signed)
I did see the results.  He can now be scheduled for a total knee replacement.  He needs to stop Xarelto 4 days prior to surgery.

## 2020-10-29 NOTE — Telephone Encounter (Signed)
I spoke with patient and scheduled surgery

## 2020-11-04 ENCOUNTER — Telehealth: Payer: Self-pay | Admitting: Orthopaedic Surgery

## 2020-11-04 NOTE — Telephone Encounter (Signed)
Lvm informing pt.

## 2020-11-04 NOTE — Telephone Encounter (Signed)
He can get his booster shot up until a week before surgery.  Any closer than that, he should wait.

## 2020-11-04 NOTE — Telephone Encounter (Signed)
Pt called asking if it was okay to get his booter shot before his surgery or if he should wait.

## 2020-11-17 NOTE — Progress Notes (Signed)
Please place orders in epic pt. Is scheduled for a preop ! Thank You ! 

## 2020-11-17 NOTE — Progress Notes (Addendum)
PCP - Ria Clock, MD  Cardiologist - Dr. Jacquelyne Balint 09-22-20 epic  PPM/ICD -  Device Orders -  Rep Notified -   Chest x-ray -  EKG - 07-16-20 epic Stress Test - 07-23-20 epic ECHO - 07-28-20 epic Cardiac Cath -  HgbA1c- 10-20-20 7.3 epic  Sleep Study -  CPAP -   Fasting Blood Sugar - 140's Checks Blood Sugar _1-2____ times a day  Blood Thinner Instructions: Aspirin Instructions:  ERAS Protcol - PRE-SURGERY G2-   COVID TEST- 1-11  Activity-can walk to mailbox without sob walked up hill from parking lot at preop without SOB Anesthesia review: HTN,CAD,MI,A-fib, DM2  Patient denies shortness of breath, fever, cough and chest pain at PAT appointment NONE   All instructions explained to the patient, with a verbal understanding of the material. Patient agrees to go over the instructions while at home for a better understanding. Patient also instructed to self quarantine after being tested for COVID-19. The opportunity to ask questions was provided.

## 2020-11-17 NOTE — Patient Instructions (Addendum)
DUE TO COVID-19 ONLY ONE VISITOR IS ALLOWED TO COME WITH YOU AND STAY IN THE WAITING ROOM ONLY DURING PRE OP AND PROCEDURE DAY OF SURGERY. THE 1 VISITOR  MAY VISIT WITH YOU AFTER SURGERY IN YOUR PRIVATE ROOM DURING VISITING HOURS ONLY!  YOU NEED TO HAVE A COVID 19 TEST ON__1-11-22_____ @_______ , THIS TEST MUST BE DONE BEFORE SURGERY,  COVID TESTING SITE [address] [place] , IT IS ON THE RIGHT GOING OUT [address] APPROXIMATELY  2 MINUTES PAST [employer] ON THE RIGHT. ONCE YOUR COVID TEST IS COMPLETED,  PLEASE BEGIN THE QUARANTINE INSTRUCTIONS AS OUTLINED IN YOUR HANDOUT.                BRODEN HOLT  11/17/2020   Your procedure is scheduled on: 12-03-20   Report to [facility] Main  Entrance   Report to admitting at    0830  AM     Call this number if you have problems the morning of surgery [phone]    Remember: NO SOLID FOOD AFTER MIDNIGHT THE NIGHT PRIOR TO SURGERY. NOTHING BY MOUTH EXCEPT CLEAR LIQUIDS UNTIL    0800 am . PLEASE FINISH G2 DRINK PER SURGEON ORDER  WHICH NEEDS TO BE COMPLETED AT       0800 am then nothing by mouth .     CLEAR LIQUID DIET Water                                                              Black Coffee and tea, regular and decaf                             Plain Jell-O any favor except red or purple                                            Fruit ices (not with fruit pulp)                                     Iced Popsicles                                     Carbonated beverages, regular and diet                                    Cranberry, grape and apple juices Sports drinks like Gatorade Lightly seasoned clear broth or consume(fat free) Sugar, honey syrup   _____________________________________________________________________   BRUSH YOUR TEETH MORNING OF SURGERY AND RINSE YOUR MOUTH OUT, NO CHEWING GUM CANDY OR MINTS.     Take these medicines the morning of surgery with A SIP OF  WATER: inhaler bring with you, xanax if needed  Take 50% of your normal Lantus dose the night before your surgery.    DO NOT TAKE ANY DIABETIC MEDICATIONS DAY OF YOUR SURGERY  You may not have any metal on your body including hair pins and              piercings  Do not wear jewelry,  lotions, powders or perfumes, deodorant                   Men may shave face and neck.   Do not bring valuables to the hospital. [place].  Contacts, dentures or bridgework may not be worn into surgery.       Patients discharged the day of surgery will not be allowed to drive home. IF YOU ARE HAVING SURGERY AND GOING HOME THE SAME DAY, YOU MUST HAVE AN ADULT TO DRIVE YOU HOME AND BE WITH YOU FOR 24 HOURS. YOU MAY GO HOME BY TAXI OR UBER OR ORTHERWISE, BUT AN ADULT MUST ACCOMPANY YOU HOME AND STAY WITH YOU FOR 24 HOURS.  Name and phone number of your driver:  Special Instructions: N/A              Please read over the following fact sheets you were given: _____________________________________________________________________             [facility] - Preparing for Surgery Before surgery, you can play an important role.  Because skin is not sterile, your skin needs to be as free of germs as possible.  You can reduce the number of germs on your skin by washing with CHG (chlorahexidine gluconate) soap before surgery.  CHG is an antiseptic cleaner which kills germs and bonds with the skin to continue killing germs even after washing. Please DO NOT use if you have an allergy to CHG or antibacterial soaps.  If your skin becomes reddened/irritated stop using the CHG and inform your nurse when you arrive at Short Stay. Do not shave (including legs and underarms) for at least 48 hours prior to the first CHG shower.  You may shave your face/neck. Please follow these instructions carefully:  1.  Shower with CHG Soap the night before  surgery and the  morning of Surgery.  2.  If you choose to wash your hair, wash your hair first as usual with your  normal  shampoo.  3.  After you shampoo, rinse your hair and body thoroughly to remove the  shampoo.                           4.  Use CHG as you would any other liquid soap.  You can apply chg directly  to the skin and wash                       Gently with a scrungie or clean washcloth.  5.  Apply the CHG Soap to your body ONLY FROM THE NECK DOWN.   Do not use on face/ open                           Wound or open sores. Avoid contact with eyes, ears mouth and genitals (private parts).                       Wash face,  Genitals (private parts) with your normal soap.             6.  Wash thoroughly, paying special attention to the area where your surgery  will be performed.  7.  Thoroughly rinse your body with warm water from the neck down.  8.  DO NOT shower/wash with your normal soap after using and rinsing off  the CHG Soap.                9.  Pat yourself dry with a clean towel.            10.  Wear clean pajamas.            11.  Place clean sheets on your bed the night of your first shower and do not  sleep with pets. Day of Surgery : Do not apply any lotions/deodorants the morning of surgery.  Please wear clean clothes to the hospital/surgery center.   Incentive Spirometer  An incentive spirometer is a tool that can help keep your lungs clear and active. This tool measures how well you are filling your lungs with each breath. Taking long deep breaths may help reverse or decrease the chance of developing breathing (pulmonary) problems (especially infection) following:  A long period of time when you are unable to move or be active. BEFORE THE PROCEDURE   If the spirometer includes an indicator to show your best effort, your nurse or respiratory therapist will set it to a desired goal.  If possible, sit up straight or lean slightly forward. Try not to slouch.  Hold the  incentive spirometer in an upright position. INSTRUCTIONS FOR USE  1. Sit on the edge of your bed if possible, or sit up as far as you can in bed or on a chair. 2. Hold the incentive spirometer in an upright position. 3. Breathe out normally. 4. Place the mouthpiece in your mouth and seal your lips tightly around it. 5. Breathe in slowly and as deeply as possible, raising the piston or the ball toward the top of the column. 6. Hold your breath for 3-5 seconds or for as long as possible. Allow the piston or ball to fall to the bottom of the column. 7. Remove the mouthpiece from your mouth and breathe out normally. 8. Rest for a few seconds and repeat Steps 1 through 7 at least 10 times every 1-2 hours when you are awake. Take your time and take a few normal breaths between deep breaths. 9. The spirometer may include an indicator to show your best effort. Use the indicator as a goal to work toward during each repetition. 10. After each set of 10 deep breaths, practice coughing to be sure your lungs are clear. If you have an incision (the cut made at the time of surgery), support your incision when coughing by placing a pillow or rolled up towels firmly against it. Once you are able to get out of bed, walk around indoors and cough well. You may stop using the incentive spirometer when instructed by your caregiver.  RISKS AND COMPLICATIONS  Take your time so you do not get dizzy or light-headed.  If you are in pain, you may need to take or ask for pain medication before doing incentive spirometry. It is harder to take a deep breath if you are having pain. AFTER USE  Rest and breathe slowly and easily.  It can be helpful to keep track of a log of your progress. Your caregiver can provide you with a simple table to help with this. If you are using the spirometer at home, follow  these instructions: SEEK MEDICAL CARE IF:   You are having difficultly using the spirometer.  You have trouble using  the spirometer as often as instructed.  Your pain medication is not giving enough relief while using the spirometer.  You develop fever of 100.5 F (38.1 C) or higher. SEEK IMMEDIATE MEDICAL CARE IF:   You cough up bloody sputum that had not been present before.  You develop fever of 102 F (38.9 C) or greater.  You develop worsening pain at or near the incision site. MAKE SURE YOU:   Understand these instructions.  Will watch your condition.  Will get help right away if you are not doing well or get worse. Document Released: 03/19/2007 Document Revised: 01/29/2012 Document Reviewed: 05/20/2007 ExitCare Patient Information 2014 [place].   ________________________________________________________________________  FAILURE TO FOLLOW THESE INSTRUCTIONS MAY RESULT IN THE CANCELLATION OF YOUR SURGERY PATIENT SIGNATURE_________________________________  NURSE SIGNATURE__________________________________  ________________________________________________________________________

## 2020-11-22 ENCOUNTER — Other Ambulatory Visit: Payer: Self-pay | Admitting: Physician Assistant

## 2020-11-23 ENCOUNTER — Other Ambulatory Visit: Payer: Self-pay

## 2020-11-25 ENCOUNTER — Other Ambulatory Visit: Payer: Self-pay

## 2020-11-25 ENCOUNTER — Encounter (HOSPITAL_COMMUNITY): Payer: Self-pay

## 2020-11-25 ENCOUNTER — Encounter (HOSPITAL_COMMUNITY)
Admission: RE | Admit: 2020-11-25 | Discharge: 2020-11-25 | Disposition: A | Discharge status: Home / Self Care | Payer: Medicare Other | Source: Ambulatory Visit | Attending: Orthopaedic Surgery | Admitting: Orthopaedic Surgery

## 2020-11-25 DIAGNOSIS — Z01812 Encounter for preprocedural laboratory examination: Secondary | ICD-10-CM | POA: Diagnosis not present

## 2020-11-25 HISTORY — DX: Personal history of urinary calculi: Z87.442

## 2020-11-25 LAB — BASIC METABOLIC PANEL
Anion gap: 12 (ref 5–15)
BUN: 37 mg/dL — ABNORMAL HIGH (ref 8–23)
CO2: 29 mmol/L (ref 22–32)
Calcium: 9.4 mg/dL (ref 8.9–10.3)
Chloride: 100 mmol/L (ref 98–111)
Creatinine, Ser: 1.77 mg/dL — ABNORMAL HIGH (ref 0.61–1.24)
GFR, Estimated: 42 mL/min — ABNORMAL LOW (ref >60)
Glucose, Bld: 81 mg/dL (ref 70–99)
Potassium: 3.5 mmol/L (ref 3.5–5.1)
Sodium: 141 mmol/L (ref 135–145)

## 2020-11-25 LAB — CBC
HCT: 52.3 % — ABNORMAL HIGH (ref 39.0–52.0)
Hemoglobin: 16.2 g/dL (ref 13.0–17.0)
MCH: 26.3 pg (ref 26.0–34.0)
MCHC: 31 g/dL (ref 30.0–36.0)
MCV: 84.9 fL (ref 80.0–100.0)
Platelets: 120 10*3/uL — ABNORMAL LOW (ref 150–400)
RBC: 6.16 MIL/uL — ABNORMAL HIGH (ref 4.22–5.81)
RDW: 18.3 % — ABNORMAL HIGH (ref 11.5–15.5)
WBC: 6.6 10*3/uL (ref 4.0–10.5)
nRBC: 0 % (ref 0.0–0.2)

## 2020-11-25 LAB — GLUCOSE, CAPILLARY: Glucose-Capillary: 76 mg/dL (ref 70–99)

## 2020-11-25 LAB — SURGICAL PCR SCREEN
MRSA, PCR: NEGATIVE
Staphylococcus aureus: POSITIVE — AB

## 2020-11-30 ENCOUNTER — Other Ambulatory Visit (HOSPITAL_COMMUNITY)
Admission: RE | Admit: 2020-11-30 | Discharge: 2020-11-30 | Disposition: A | Discharge status: Home / Self Care | Payer: Medicare Other | Source: Ambulatory Visit | Attending: Orthopaedic Surgery | Admitting: Orthopaedic Surgery

## 2020-11-30 DIAGNOSIS — Z01812 Encounter for preprocedural laboratory examination: Secondary | ICD-10-CM | POA: Diagnosis not present

## 2020-11-30 DIAGNOSIS — Z20822 Contact with and (suspected) exposure to covid-19: Secondary | ICD-10-CM | POA: Insufficient documentation

## 2020-11-30 LAB — SARS CORONAVIRUS 2 (TAT 6-24 HRS): SARS Coronavirus 2: NEGATIVE

## 2020-12-01 NOTE — Progress Notes (Signed)
Anesthesia Chart Review   Case: [id number] Date/Time: 12/03/20 0930   Procedure: LEFT TOTAL KNEE ARTHROPLASTY (Left Knee)   Anesthesia type: Choice   Pre-op diagnosis: Left Knee Osteoarthritis   Location: [facility] 09 / [facility]   Surgeons: Mcarthur Rossetti, MD      DISCUSSION:69 y.o. former smoker with h/o HTN, DM II, CAD, PAF (on Xarelto, advised to hold 4 days), ischemic cardiomyopathy, renal insufficiency, left knee OA scheduled for above procedure 12/03/2020 with Dr. Jean Rosenthal.   Pt last seen by cardiologist 09/22/2020. Echo 07/28/2020 with EF 35-40%. Per results, "This patient was done for preop assessment. Echocardiogram revealed moderately depressed ejection fraction. Stress test did not reveal any evidence of ischemia.  In view of this patient is at moderate risk for events during the surgery."  Anticipate pt can proceed with planned procedure barring acute status change.   VS: BP 118/75   Pulse 92   Temp 36.7 C (Oral)   Resp 18   Ht 6\' 1"  (1.854 m)   Wt 105.2 kg   SpO2 98%   BMI 30.61 kg/m   PROVIDERS: Raeanne Gathers, MD is PCP   Jyl Heinz, MD is Cardiologist  LABS: Labs reviewed: Acceptable for surgery. (all labs ordered are listed, but only abnormal results are displayed)  Labs Reviewed  SURGICAL PCR SCREEN - Abnormal; Notable for the following components:      Result Value   Staphylococcus aureus POSITIVE (*)    All other components within normal limits  BASIC METABOLIC PANEL - Abnormal; Notable for the following components:   BUN 37 (*)    Creatinine, Ser 1.77 (*)    GFR, Estimated 42 (*)    All other components within normal limits  CBC - Abnormal; Notable for the following components:   RBC 6.16 (*)    HCT 52.3 (*)    RDW 18.3 (*)    Platelets 120 (*)    All other components within normal limits  GLUCOSE, CAPILLARY  TYPE AND SCREEN     IMAGES:   EKG: 07/16/2020 Undetermined rhythm  Nonspecific intraventricular conduction  delay ST & T wave abnormality, consider lateral ischemia   CV: Echo 07/28/2020 IMPRESSIONS    1. Left ventricular ejection fraction, by estimation, is 35 to 40%. The  left ventricle has moderately decreased function. The left ventricle has  no regional wall motion abnormalities. Left ventricular diastolic function  could not be evaluated.  2. Left atrial size was moderate to severe.  3. The mitral valve is normal in structure. Mild mitral valve  regurgitation. No evidence of mitral stenosis.  4. 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   Myocardial Perfusion 07/23/2020  The left ventricular ejection fraction is severely decreased (<30%).  Nuclear stress EF: 21%.  Findings consistent with prior myocardial infarction.  This is a high risk study.   Small inferior basal wall infarct no ischemia Study deemed high risk  Based on EF estimated at 21%.  Suggest echo / MRI correlation since Patient is in afib  Past Medical History:  Diagnosis Date  . Bilateral leg edema 05/04/2017  . 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 CAD (coronary artery disease), native coronary artery 11/12/2018   Formatting of this note might be different from the original. S/p MI - Dr. Maurene Capes  . CAD, NATIVE  VESSEL 06/15/2009   Qualifier: Diagnosis of  By: Haroldine Laws, MD, Eileen Stanford CAP (community acquired pneumonia) 08/17/2016  . Depressive disorder 11/12/2018  . Diabetes mellitus due to underlying condition with unspecified complications ([place]) 05/16/349  . DM (diabetes mellitus) ([place])   . 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 Erythrocytosis 06/01/2020  . Essential hypertension 10/14/2007   Formatting of this note might be different from the original. Hypertension  10/1 IMO update  . Gout, unspecified 10/14/2007   Overview:  Gout  10/1 IMO update   Formatting of this note might be different from the original. Gout  10/1 IMO update  . History of colonic polyps 11/12/2018   Overview:  rpt colonoscopy 5/17; Dr. Haig Prophet of this note might be different from the original. rpt colonoscopy 5/17; Dr. Erlene Quan  . History of kidney stones   . History of nephrolithiasis 11/12/2018  . History of smoking 11/12/2018   Overview:  quit 2000  Formatting of this note might be different from the original. quit 2000  . HTN (hypertension)   . Hyperlipidemia   . HYPERLIPIDEMIA-MIXED 10/14/2007   Qualifier: Diagnosis of  By: Haroldine Laws, MD, Willette Cluster of this note might be different from the original. Hyperlipidemia  . HYPERTENSION, BENIGN 06/15/2009  . 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 Ischemic cardiomyopathy 08/12/2020  . Medication intolerance 11/12/2018  . Mixed hyperlipidemia 11/12/2018  . Old myocardial infarction 11/12/2018  . Paroxysmal atrial fibrillation ([place]) 07/16/2020  . Pre-operative cardiovascular examination 11/12/2018  . Primary osteoarthritis of both knees 03/05/2017   Considering Zilretta injections. Good improvement with corticosteroid injections however short-lived. Mild improvement with Visco supplementation.  . Renal insufficiency 08/12/2020   pt denies  . Right carotid bruit 08/05/2014  . 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 Type II or unspecified type diabetes mellitus without mention of complication, not stated as uncontrolled 10/14/2007   Formatting of this note might be different from the original. Diabetes Mellitus  . Unilateral primary osteoarthritis, right knee 11/15/2018    Past Surgical History:  Procedure Laterality Date  . BILATERAL KNEE ARTHROSCOPY    . CORONARY ANGIOPLASTY WITH STENT PLACEMENT    . LAPAROSCOPIC INGUINAL HERNIA REPAIR    . NOSE SURGERY    . TOTAL KNEE ARTHROPLASTY Right 11/15/2018    Procedure: RIGHT TOTAL KNEE ARTHROPLASTY;  Surgeon: Mcarthur Rossetti, MD;  Location: [facility];  Service: Orthopedics;  Laterality: Right;    MEDICATIONS: . ALPRAZolam (XANAX) 0.25 MG tablet  . aspirin 81 MG tablet  . chlorthalidone (HYGROTON) 25 MG tablet  . CIALIS 20 MG tablet  . cyanocobalamin (,VITAMIN B-12,) 1000 MCG/ML injection  . fluticasone (FLONASE) 50 MCG/ACT nasal spray  . FREESTYLE INSULINX TEST test strip  . furosemide (LASIX) 20 MG tablet  . glipiZIDE (GLUCOTROL XL) 10 MG 24 hr tablet  . Lancets (FREESTYLE) lancets  . LANTUS SOLOSTAR 100 UNIT/ML Solostar Pen  . levalbuterol (XOPENEX HFA) 45 MCG/ACT inhaler  . Potassium 99 MG TABS  . rivaroxaban (XARELTO) 20 MG TABS tablet  . rosuvastatin (CRESTOR) 20 MG tablet  . TART CHERRY PO  . telmisartan (MICARDIS) 80 MG tablet  . traMADol (ULTRAM) 50 MG tablet   No current facility-administered medications for this encounter.   Konrad Felix, PA-C [facility] [phone]

## 2020-12-01 NOTE — Anesthesia Preprocedure Evaluation (Signed)
Anesthesia Evaluation  Patient identified by MRN, date of birth, ID band Patient awake    Reviewed: Allergy & Precautions, NPO status , Patient's Chart, lab work & pertinent test results, reviewed documented beta blocker date and time   Airway Mallampati: II  TM Distance: >3 FB Neck ROM: Full    Dental no notable dental hx. (+) Teeth Intact   Pulmonary pneumonia, resolved, former smoker,    Pulmonary exam normal breath sounds clear to auscultation       Cardiovascular hypertension, Pt. on medications + CAD, + Past MI and + Cardiac Stents  Normal cardiovascular exam+ dysrhythmias Atrial Fibrillation  Rhythm:Regular Rate:Normal  EKG 07/16/20 NSR, non specific IVCD, T wave inversion I & aVL suggestive of ischemia  Echo 07/28/20 1. Left ventricular ejection fraction, by estimation, is 35 to 40%. The left ventricle has moderately decreased function. The left ventricle has no regional wall motion abnormalities. Left ventricular diastolic function  could not be evaluated.  2. Left atrial size was moderate to severe.  3. The mitral valve is normal in structure. Mild mitral valve  regurgitation. No evidence of mitral stenosis.  4. There is mild to moderate dilatation of the ascending aorta, measuring 40 mm.   Ischemic CM   Neuro/Psych PSYCHIATRIC DISORDERS Depression negative neurological ROS     GI/Hepatic negative GI ROS, Neg liver ROS,   Endo/Other  diabetes, Well Controlled, Type 2, Oral Hypoglycemic AgentsHyperlipidemia Gout  Renal/GU Renal InsufficiencyRenal disease   ED    Musculoskeletal  (+) Arthritis , Osteoarthritis,  OA left knee   Abdominal (+) + obese,   Peds  Hematology  (+) Blood dyscrasia, , Xarelto therapy- last dose 11/27/20 Erythrocytosis   Anesthesia Other Findings   Reproductive/Obstetrics                                                              Anesthesia Evaluation   Patient identified by MRN, date of birth, ID band Patient awake    Reviewed: Allergy & Precautions, NPO status , Patient's Chart, lab work & pertinent test results, reviewed documented beta blocker date and time   Airway Mallampati: II  TM Distance: >3 FB     Dental   Pulmonary former smoker,    breath sounds clear to auscultation       Cardiovascular hypertension, Pt. on home beta blockers and Pt. on medications + CAD, + Past MI, + Cardiac Stents and +CHF   Rhythm:Regular Rate:Normal  Stress test done on 11/14/18, EF 32%, no ischemia noted.  Considered high risk study due to EF.    Discussion with PA and Dr. Geraldo Pitter regarding results of stress test.  He states that patient is cleared for surgery tomorrow, at moderate risk, no ischemia noted on stress test, no need for echo at this time.   Neuro/Psych negative neurological ROS     GI/Hepatic negative GI ROS, Neg liver ROS,   Endo/Other  diabetes, Type 2, Insulin Dependent  Renal/GU negative Renal ROS     Musculoskeletal  (+) Arthritis ,   Abdominal   Peds  Hematology  (+) Blood dyscrasia (on plavix. Last dose 12/22), ,   Anesthesia Other Findings   Reproductive/Obstetrics  Lab Results  Component Value Date   WBC 6.6 11/25/2020   HGB 16.2 11/25/2020   HCT 52.3 (H) 11/25/2020   MCV 84.9 11/25/2020   PLT 120 (L) 11/25/2020   Lab Results  Component Value Date   CREATININE 1.77 (H) 11/25/2020   BUN 37 (H) 11/25/2020   NA 141 11/25/2020   K 3.5 11/25/2020   CL 100 11/25/2020   CO2 29 11/25/2020    Anesthesia Physical Anesthesia Plan  ASA: III  Anesthesia Plan: General   Post-op Pain Management:  Regional for Post-op pain   Induction: Intravenous  PONV Risk Score and Plan: 2 and Ondansetron, Dexamethasone and Treatment may vary due to age or medical condition  Airway Management Planned: LMA and Oral ETT  Additional Equipment:    Intra-op Plan:   Post-operative Plan: Extubation in OR  Informed Consent: I have reviewed the patients History and Physical, chart, labs and discussed the procedure including the risks, benefits and alternatives for the proposed anesthesia with the patient or authorized representative who has indicated his/her understanding and acceptance.   Dental advisory given  Plan Discussed with:   Anesthesia Plan Comments:        Anesthesia Quick Evaluation  Anesthesia Physical Anesthesia Plan  ASA: III  Anesthesia Plan: Spinal   Post-op Pain Management:    Induction:   PONV Risk Score and Plan: 2 and Treatment may vary due to age or medical condition, Propofol infusion and Ondansetron  Airway Management Planned: Natural Airway and Nasal Cannula  Additional Equipment: Arterial line  Intra-op Plan:   Post-operative Plan:   Informed Consent: I have reviewed the patients History and Physical, chart, labs and discussed the procedure including the risks, benefits and alternatives for the proposed anesthesia with the patient or authorized representative who has indicated his/her understanding and acceptance.     Dental advisory given  Plan Discussed with: CRNA and Anesthesiologist  Anesthesia Plan Comments: (See PAT note 11/28/2020, Konrad Felix, PA-C)       Anesthesia Quick Evaluation

## 2020-12-02 ENCOUNTER — Telehealth: Payer: Self-pay | Admitting: *Deleted

## 2020-12-02 DIAGNOSIS — M1712 Unilateral primary osteoarthritis, left knee: Secondary | ICD-10-CM

## 2020-12-02 HISTORY — DX: Unilateral primary osteoarthritis, left knee: M17.12

## 2020-12-02 NOTE — Telephone Encounter (Signed)
Ortho bundle pre-op call completed. 

## 2020-12-02 NOTE — Care Plan (Signed)
RNCM call to patient to discuss his upcoming Left total knee arthroplasty with Dr. Ninfa Linden. The patient is an Ortho bundle through [facility] and is agreeable to case management. He has a FWW as well as elevated toilet seats in the home. No DME needed. Anticipate HHPT will be needed after a short hospital stay. Choice provided and referral made to [facility]. Reviewed all post op care instructions. Patient requested [facility] for OPPT when appropriate. RNCM will help schedule this. Will continue to follow for needs.

## 2020-12-02 NOTE — H&P (Signed)
TOTAL KNEE ADMISSION H&P  Patient is being admitted for left total knee arthroplasty.  Subjective:  Chief Complaint:left knee pain.  HPI: [name] [name], 70 y.o. male, has a history of pain and functional disability in the left knee due to arthritis and has failed non-surgical conservative treatments for greater than 12 weeks to includeNSAID's and/or analgesics, corticosteriod injections, viscosupplementation injections, flexibility and strengthening excercises, supervised PT with diminished ADL's post treatment, use of assistive devices and activity modification.  Onset of symptoms was gradual, starting 5 years ago with gradually worsening course since that time. The patient noted no past surgery on the left knee(s).  Patient currently rates pain in the left knee(s) at 10 out of 10 with activity. Patient has night pain, worsening of pain with activity and weight bearing, pain that interferes with activities of daily living, pain with passive range of motion, crepitus and joint swelling.  Patient has evidence of subchondral sclerosis, periarticular osteophytes and joint space narrowing by imaging studies. There is no active infection.  Patient Active Problem List   Diagnosis Date Noted  . Arthritis of left knee 12/02/2020  . Renal insufficiency 08/12/2020  . Ischemic cardiomyopathy 08/12/2020  . CAD (coronary artery disease)   . DM (diabetes mellitus) ([place])   . HTN (hypertension)   . Hyperlipidemia   . Diabetes mellitus due to underlying condition with unspecified complications ([place]) 40/98/1191  . Paroxysmal atrial fibrillation ([place]) 07/16/2020  . Erythrocytosis 06/01/2020  . Unilateral primary osteoarthritis, right knee 11/15/2018  . 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 Depressive disorder 11/12/2018  . 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 History of colonic polyps 11/12/2018  . History of nephrolithiasis 11/12/2018  . History of smoking  11/12/2018  . 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 Medication intolerance 11/12/2018  . Old myocardial infarction 11/12/2018  . Mixed hyperlipidemia 11/12/2018  . Pre-operative cardiovascular examination 11/12/2018  . CAD (coronary artery disease), native coronary artery 11/12/2018  . Bilateral leg edema 05/04/2017  . Primary osteoarthritis of both knees 03/05/2017  . CAP (community acquired pneumonia) 08/17/2016  . Right carotid bruit 08/05/2014  . HYPERTENSION, BENIGN 06/15/2009  . CAD, NATIVE VESSEL 06/15/2009  . Type II or unspecified type diabetes mellitus without mention of complication, not stated as uncontrolled 10/14/2007  . Gout, unspecified 10/14/2007  . Essential hypertension 10/14/2007   Past Medical History:  Diagnosis Date  . Bilateral leg edema 05/04/2017  . 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 CAD (coronary artery disease), native coronary artery 11/12/2018   Formatting of this note might be different from the original. S/p MI - Dr. Maurene Capes  . CAD, NATIVE VESSEL 06/15/2009   Qualifier: Diagnosis of  By: Haroldine Laws, MD, Eileen Stanford CAP (community acquired pneumonia) 08/17/2016  . Depressive disorder 11/12/2018  . Diabetes mellitus due to underlying condition with unspecified complications ([place]) 4/78/2956  . DM (diabetes mellitus) ([place])   . 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 Erythrocytosis 06/01/2020  . Essential hypertension 10/14/2007   Formatting of this note might be different from the original. Hypertension  10/1 IMO update  . Gout, unspecified 10/14/2007   Overview:  Gout  10/1 IMO update  Formatting of this note might be different from the original. Gout  10/1 IMO update  .  History of colonic polyps 11/12/2018   Overview:  rpt colonoscopy 5/17; Dr. Haig Prophet of  this note might be different from the original. rpt colonoscopy 5/17; Dr. Erlene Quan  . History of kidney stones   . History of nephrolithiasis 11/12/2018  . History of smoking 11/12/2018   Overview:  quit 2000  Formatting of this note might be different from the original. quit 2000  . HTN (hypertension)   . Hyperlipidemia   . HYPERLIPIDEMIA-MIXED 10/14/2007   Qualifier: Diagnosis of  By: Haroldine Laws, MD, Willette Cluster of this note might be different from the original. Hyperlipidemia  . HYPERTENSION, BENIGN 06/15/2009  . 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 Ischemic cardiomyopathy 08/12/2020  . Medication intolerance 11/12/2018  . Mixed hyperlipidemia 11/12/2018  . Old myocardial infarction 11/12/2018  . Paroxysmal atrial fibrillation ([place]) 07/16/2020  . Pre-operative cardiovascular examination 11/12/2018  . Primary osteoarthritis of both knees 03/05/2017   Considering Zilretta injections. Good improvement with corticosteroid injections however short-lived. Mild improvement with Visco supplementation.  . Renal insufficiency 08/12/2020   pt denies  . Right carotid bruit 08/05/2014  . 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 Type II or unspecified type diabetes mellitus without mention of complication, not stated as uncontrolled 10/14/2007   Formatting of this note might be different from the original. Diabetes Mellitus  . Unilateral primary osteoarthritis, right knee 11/15/2018    Past Surgical History:  Procedure Laterality Date  . BILATERAL KNEE ARTHROSCOPY    . CORONARY ANGIOPLASTY WITH STENT PLACEMENT    . LAPAROSCOPIC INGUINAL HERNIA REPAIR    . NOSE SURGERY    . TOTAL KNEE ARTHROPLASTY Right 11/15/2018   Procedure: RIGHT TOTAL KNEE ARTHROPLASTY;  Surgeon: Mcarthur Rossetti, MD;  Location: [facility];  Service: Orthopedics;  Laterality: Right;    No current facility-administered  medications for this encounter.   Current Outpatient Medications  Medication Sig Dispense Refill Last Dose  . ALPRAZolam (XANAX) 0.25 MG tablet Take 0.25 mg by mouth at bedtime as needed for anxiety.      Marland Kitchen aspirin 81 MG tablet Take 81 mg by mouth at bedtime.     . chlorthalidone (HYGROTON) 25 MG tablet Take 25 mg by mouth daily in the afternoon.     Marland Kitchen CIALIS 20 MG tablet Take 20 mg by mouth daily as needed for erectile dysfunction.      . cyanocobalamin (,VITAMIN B-12,) 1000 MCG/ML injection Inject 1,000 mcg into the muscle every 30 (thirty) days.      . fluticasone (FLONASE) 50 MCG/ACT nasal spray Place 1 spray into both nostrils daily as needed for allergies or rhinitis.     . furosemide (LASIX) 20 MG tablet Take 20 mg by mouth daily as needed for fluid or edema.     Marland Kitchen glipiZIDE (GLUCOTROL XL) 10 MG 24 hr tablet Take 10 mg by mouth daily with breakfast.     . LANTUS SOLOSTAR 100 UNIT/ML Solostar Pen Inject 25 Units into the skin at bedtime.     . levalbuterol (XOPENEX HFA) 45 MCG/ACT inhaler Inhale 1 puff into the lungs every 4 (four) hours as needed for wheezing.      . Potassium 99 MG TABS Take 99 mg by mouth at bedtime.     . rivaroxaban (XARELTO) 20 MG TABS tablet Take 1 tablet (20 mg total) by mouth daily with supper. 30 tablet 6   . rosuvastatin (CRESTOR) 20 MG  tablet Take 20 mg by mouth at bedtime.     Marland Kitchen TART CHERRY PO Take 1 tablet by mouth at bedtime.     Marland Kitchen telmisartan (MICARDIS) 80 MG tablet Take 40 mg by mouth at bedtime.     . traMADol (ULTRAM) 50 MG tablet Take 25 mg by mouth every 4 (four) hours as needed for pain or severe pain.     Marland Kitchen FREESTYLE INSULINX TEST test strip      . Lancets (FREESTYLE) lancets       Allergies  Allergen Reactions  . Allopurinol Other (See Comments)    Felt terrible  . Aripiprazole Other (See Comments)    Felt terrible  . Bupropion Other (See Comments)    Felt bad  . Buspirone Other (See Comments)    Felt bad  . Desvenlafaxine Other (See  Comments)    Made him hyper  . Duloxetine Other (See Comments)    Knocked him out  . Escitalopram Other (See Comments)    Felt like crap  . Fish Oil     unknown  . Fluticasone Furoate Other (See Comments)    Unknown   . Hydroxyzine Hcl Other (See Comments)    Knocked him out  . Nortriptyline Other (See Comments)    Ineffective  . Paroxetine Hcl Other (See Comments)    unknown  . Ramipril Cough  . Sertraline Other (See Comments)    "felt plugged in"  . Venlafaxine Other (See Comments)    Felt bad  . Vilazodone Other (See Comments)    "felt weird"    Social History   Tobacco Use  . Smoking status: Former Smoker    Packs/day: 0.50    Years: 10.00    Pack years: 5.00  . Smokeless tobacco: Former Systems developer    Types: Snuff, Chew    Quit date: 11/20/1968  . Tobacco comment: in his 20's  Substance Use Topics  . Alcohol use: No    Family History  Problem Relation Age of Onset  . Hypertension Mother   . Cancer Mother   . Heart attack Father   . Hypertension Father   . Diabetes Father   . Hypertension Brother   . Irregular heart beat Brother      Review of Systems  Musculoskeletal: Positive for joint swelling.  All other systems reviewed and are negative.   Objective:  Physical Exam Vitals reviewed.  Constitutional:      Appearance: Normal appearance.  HENT:     Head: Normocephalic and atraumatic.  Eyes:     Extraocular Movements: Extraocular movements intact.     Pupils: Pupils are equal, round, and reactive to light.  Cardiovascular:     Rate and Rhythm: Normal rate.     Pulses: Normal pulses.  Pulmonary:     Effort: Pulmonary effort is normal.  Abdominal:     Palpations: Abdomen is soft.  Musculoskeletal:     Cervical back: Normal range of motion.     Left knee: Effusion, bony tenderness and crepitus present. Decreased range of motion. Tenderness present over the medial joint line, lateral joint line and patellar tendon. Abnormal alignment.  Neurological:      Mental Status: He is alert and oriented to person, place, and time.  Psychiatric:        Behavior: Behavior normal.     Vital signs in last 24 hours:    Labs:   Estimated body mass index is 30.61 kg/m as calculated from the following:   Height  as of 11/25/20: 6\' 1"  (1.854 m).   Weight as of 11/25/20: 105.2 kg.   Imaging Review Plain radiographs demonstrate severe degenerative joint disease of the left knee(s). The overall alignment ismild varus. The bone quality appears to be good for age and reported activity level.      Assessment/Plan:  End stage arthritis, left knee   The patient history, physical examination, clinical judgment of the provider and imaging studies are consistent with end stage degenerative joint disease of the left knee(s) and total knee arthroplasty is deemed medically necessary. The treatment options including medical management, injection therapy arthroscopy and arthroplasty were discussed at length. The risks and benefits of total knee arthroplasty were presented and reviewed. The risks due to aseptic loosening, infection, stiffness, patella tracking problems, thromboembolic complications and other imponderables were discussed. The patient acknowledged the explanation, agreed to proceed with the plan and consent was signed. Patient is being admitted for inpatient treatment for surgery, pain control, PT, OT, prophylactic antibiotics, VTE prophylaxis, progressive ambulation and ADL's and discharge planning. The patient is planning to be discharged home with home health services

## 2020-12-03 ENCOUNTER — Ambulatory Visit (HOSPITAL_COMMUNITY): Payer: Medicare Other | Admitting: Physician Assistant

## 2020-12-03 ENCOUNTER — Observation Stay (HOSPITAL_COMMUNITY)
Admission: RE | Admit: 2020-12-03 | Discharge: 2020-12-04 | Disposition: A | Discharge status: Home / Self Care | Payer: Medicare Other | Attending: Orthopaedic Surgery | Admitting: Orthopaedic Surgery

## 2020-12-03 ENCOUNTER — Encounter (HOSPITAL_COMMUNITY)
Admission: RE | Disposition: A | Discharge status: Home / Self Care | Payer: Self-pay | Source: Home / Self Care | Attending: Orthopaedic Surgery

## 2020-12-03 ENCOUNTER — Encounter (HOSPITAL_COMMUNITY): Payer: Self-pay | Admitting: Orthopaedic Surgery

## 2020-12-03 ENCOUNTER — Ambulatory Visit (HOSPITAL_COMMUNITY): Payer: Medicare Other | Admitting: Certified Registered Nurse Anesthetist

## 2020-12-03 ENCOUNTER — Other Ambulatory Visit: Payer: Self-pay

## 2020-12-03 ENCOUNTER — Observation Stay (HOSPITAL_COMMUNITY): Payer: Medicare Other

## 2020-12-03 DIAGNOSIS — Z794 Long term (current) use of insulin: Secondary | ICD-10-CM | POA: Diagnosis not present

## 2020-12-03 DIAGNOSIS — Z96652 Presence of left artificial knee joint: Secondary | ICD-10-CM

## 2020-12-03 DIAGNOSIS — I480 Paroxysmal atrial fibrillation: Secondary | ICD-10-CM | POA: Diagnosis not present

## 2020-12-03 DIAGNOSIS — Z87891 Personal history of nicotine dependence: Secondary | ICD-10-CM | POA: Insufficient documentation

## 2020-12-03 DIAGNOSIS — Z955 Presence of coronary angioplasty implant and graft: Secondary | ICD-10-CM | POA: Diagnosis not present

## 2020-12-03 DIAGNOSIS — Z96642 Presence of left artificial hip joint: Secondary | ICD-10-CM | POA: Diagnosis not present

## 2020-12-03 DIAGNOSIS — E119 Type 2 diabetes mellitus without complications: Secondary | ICD-10-CM | POA: Insufficient documentation

## 2020-12-03 DIAGNOSIS — Z96651 Presence of right artificial knee joint: Secondary | ICD-10-CM | POA: Insufficient documentation

## 2020-12-03 DIAGNOSIS — I2510 Atherosclerotic heart disease of native coronary artery without angina pectoris: Secondary | ICD-10-CM | POA: Insufficient documentation

## 2020-12-03 DIAGNOSIS — I10 Essential (primary) hypertension: Secondary | ICD-10-CM | POA: Insufficient documentation

## 2020-12-03 DIAGNOSIS — Z7984 Long term (current) use of oral hypoglycemic drugs: Secondary | ICD-10-CM | POA: Diagnosis not present

## 2020-12-03 DIAGNOSIS — Z7982 Long term (current) use of aspirin: Secondary | ICD-10-CM | POA: Insufficient documentation

## 2020-12-03 DIAGNOSIS — E782 Mixed hyperlipidemia: Secondary | ICD-10-CM | POA: Diagnosis not present

## 2020-12-03 DIAGNOSIS — Z79899 Other long term (current) drug therapy: Secondary | ICD-10-CM | POA: Diagnosis not present

## 2020-12-03 DIAGNOSIS — G8918 Other acute postprocedural pain: Secondary | ICD-10-CM | POA: Diagnosis not present

## 2020-12-03 DIAGNOSIS — Z9889 Other specified postprocedural states: Secondary | ICD-10-CM | POA: Diagnosis not present

## 2020-12-03 DIAGNOSIS — M1712 Unilateral primary osteoarthritis, left knee: Principal | ICD-10-CM | POA: Insufficient documentation

## 2020-12-03 DIAGNOSIS — Z471 Aftercare following joint replacement surgery: Secondary | ICD-10-CM | POA: Diagnosis not present

## 2020-12-03 HISTORY — PX: TOTAL KNEE ARTHROPLASTY: SHX125

## 2020-12-03 HISTORY — DX: Presence of left artificial knee joint: Z96.652

## 2020-12-03 LAB — GLUCOSE, CAPILLARY
Glucose-Capillary: 143 mg/dL — ABNORMAL HIGH (ref 70–99)
Glucose-Capillary: 94 mg/dL (ref 70–99)
Glucose-Capillary: 191 mg/dL — ABNORMAL HIGH (ref 70–99)

## 2020-12-03 LAB — ABO/RH: ABO/RH(D): O POS

## 2020-12-03 LAB — TYPE AND SCREEN
ABO/RH(D): O POS
Antibody Screen: NEGATIVE

## 2020-12-03 SURGERY — ARTHROPLASTY, KNEE, TOTAL
Anesthesia: Spinal | Site: Knee | Laterality: Left

## 2020-12-03 MED ORDER — CHLORTHALIDONE 25 MG PO TABS
25.0000 mg | ORAL_TABLET | Freq: Every day | ORAL | Status: DC
Start: 2020-12-03 — End: 2020-12-04
  Administered 2020-12-04: 25 mg via ORAL
  Filled 2020-12-03: qty 1

## 2020-12-03 MED ORDER — POTASSIUM 99 MG PO TABS: 99.0000 mg | ORAL_TABLET | Freq: Every day | ORAL | Status: DC

## 2020-12-03 MED ORDER — ONDANSETRON HCL 4 MG/2ML IJ SOLN
4.0000 mg | Freq: Four times a day (QID) | INTRAMUSCULAR | Status: DC | PRN
  Filled 2020-12-03: qty 2

## 2020-12-03 MED ORDER — TRANEXAMIC ACID-NACL 1000-0.7 MG/100ML-% IV SOLN
1000.0000 mg | INTRAVENOUS | Status: AC
  Administered 2020-12-03: 1000 mg via INTRAVENOUS
  Filled 2020-12-03: qty 100

## 2020-12-03 MED ORDER — ROPIVACAINE HCL 7.5 MG/ML IJ SOLN
INTRAMUSCULAR | Status: DC | PRN
  Administered 2020-12-03: 20 mL via PERINEURAL

## 2020-12-03 MED ORDER — GLIPIZIDE ER 5 MG PO TB24
10.0000 mg | ORAL_TABLET | Freq: Every day | ORAL | Status: DC
  Administered 2020-12-04: 10 mg via ORAL
  Filled 2020-12-03: qty 2

## 2020-12-03 MED ORDER — PHENOL 1.4 % MT LIQD: 1.0000 | OROMUCOSAL | Status: DC | PRN

## 2020-12-03 MED ORDER — EPHEDRINE 5 MG/ML INJ
INTRAVENOUS | Status: AC
  Filled 2020-12-03: qty 10

## 2020-12-03 MED ORDER — METOCLOPRAMIDE HCL 5 MG PO TABS: 5.0000 mg | ORAL_TABLET | Freq: Three times a day (TID) | ORAL | Status: DC | PRN

## 2020-12-03 MED ORDER — ALUM & MAG HYDROXIDE-SIMETH 200-200-20 MG/5ML PO SUSP: 30.0000 mL | ORAL | Status: DC | PRN

## 2020-12-03 MED ORDER — CEFAZOLIN SODIUM-DEXTROSE 2-4 GM/100ML-% IV SOLN
2.0000 g | INTRAVENOUS | Status: AC
  Administered 2020-12-03: 2 g via INTRAVENOUS
  Filled 2020-12-03: qty 100

## 2020-12-03 MED ORDER — RIVAROXABAN 10 MG PO TABS: 20.0000 mg | ORAL_TABLET | Freq: Every day | ORAL | Status: DC

## 2020-12-03 MED ORDER — SODIUM CHLORIDE 0.9 % IV SOLN: INTRAVENOUS | Status: DC

## 2020-12-03 MED ORDER — METOCLOPRAMIDE HCL 5 MG/ML IJ SOLN: 5.0000 mg | Freq: Three times a day (TID) | INTRAMUSCULAR | Status: DC | PRN

## 2020-12-03 MED ORDER — DOCUSATE SODIUM 100 MG PO CAPS
100.0000 mg | ORAL_CAPSULE | Freq: Two times a day (BID) | ORAL | Status: DC
  Administered 2020-12-03 – 2020-12-04 (×2): 100 mg via ORAL
  Filled 2020-12-03 (×2): qty 1

## 2020-12-03 MED ORDER — PANTOPRAZOLE SODIUM 40 MG PO TBEC
40.0000 mg | DELAYED_RELEASE_TABLET | Freq: Every day | ORAL | Status: DC
  Administered 2020-12-03 – 2020-12-04 (×2): 40 mg via ORAL
  Filled 2020-12-03 (×2): qty 1

## 2020-12-03 MED ORDER — OXYCODONE HCL 5 MG PO TABS: 10.0000 mg | ORAL_TABLET | ORAL | Status: DC | PRN

## 2020-12-03 MED ORDER — ONDANSETRON HCL 4 MG PO TABS: 4.0000 mg | ORAL_TABLET | Freq: Four times a day (QID) | ORAL | Status: DC | PRN

## 2020-12-03 MED ORDER — MENTHOL 3 MG MT LOZG: 1.0000 | LOZENGE | OROMUCOSAL | Status: DC | PRN

## 2020-12-03 MED ORDER — BUPIVACAINE-EPINEPHRINE 0.25% -1:200000 IJ SOLN
INTRAMUSCULAR | Status: DC | PRN
  Administered 2020-12-03: 30 mL

## 2020-12-03 MED ORDER — FENTANYL CITRATE (PF) 100 MCG/2ML IJ SOLN: 50.0000 ug | INTRAMUSCULAR | Status: DC

## 2020-12-03 MED ORDER — ACETAMINOPHEN 325 MG PO TABS
325.0000 mg | ORAL_TABLET | Freq: Four times a day (QID) | ORAL | Status: DC | PRN
  Administered 2020-12-03: 650 mg via ORAL
  Administered 2020-12-03: 325 mg via ORAL
  Administered 2020-12-04 (×2): 650 mg via ORAL
  Filled 2020-12-03 (×4): qty 2

## 2020-12-03 MED ORDER — PROPOFOL 500 MG/50ML IV EMUL
INTRAVENOUS | Status: DC | PRN
  Administered 2020-12-03: 80 ug/kg/min via INTRAVENOUS

## 2020-12-03 MED ORDER — ARTIFICIAL TEARS OPHTHALMIC OINT
TOPICAL_OINTMENT | OPHTHALMIC | Status: DC | PRN
  Filled 2020-12-03 (×2): qty 3.5

## 2020-12-03 MED ORDER — DIPHENHYDRAMINE HCL 12.5 MG/5ML PO ELIX: 12.5000 mg | ORAL_SOLUTION | ORAL | Status: DC | PRN

## 2020-12-03 MED ORDER — INSULIN GLARGINE 100 UNIT/ML ~~LOC~~ SOLN
25.0000 [IU] | Freq: Every day | SUBCUTANEOUS | Status: DC
  Administered 2020-12-03: 25 [IU] via SUBCUTANEOUS
  Filled 2020-12-03: qty 0.25

## 2020-12-03 MED ORDER — SODIUM CHLORIDE 0.9 % IR SOLN
Status: DC | PRN
  Administered 2020-12-03: 1000 mL

## 2020-12-03 MED ORDER — CHLORHEXIDINE GLUCONATE 0.12 % MT SOLN
15.0000 mL | Freq: Once | OROMUCOSAL | Status: AC
  Administered 2020-12-03: 15 mL via OROMUCOSAL

## 2020-12-03 MED ORDER — METHOCARBAMOL 500 MG PO TABS
500.0000 mg | ORAL_TABLET | Freq: Four times a day (QID) | ORAL | Status: DC | PRN
  Administered 2020-12-03 – 2020-12-04 (×5): 500 mg via ORAL
  Filled 2020-12-03 (×5): qty 1

## 2020-12-03 MED ORDER — MIDAZOLAM HCL 2 MG/2ML IJ SOLN
1.0000 mg | INTRAMUSCULAR | Status: DC
  Administered 2020-12-03: 1 mg via INTRAVENOUS

## 2020-12-03 MED ORDER — PROPOFOL 500 MG/50ML IV EMUL
INTRAVENOUS | Status: DC | PRN
  Administered 2020-12-03 (×3): 20 mg via INTRAVENOUS

## 2020-12-03 MED ORDER — LACTATED RINGERS IV SOLN: INTRAVENOUS | Status: DC

## 2020-12-03 MED ORDER — ASPIRIN EC 81 MG PO TBEC
81.0000 mg | DELAYED_RELEASE_TABLET | Freq: Every day | ORAL | Status: DC
  Administered 2020-12-03: 81 mg via ORAL
  Filled 2020-12-03: qty 1

## 2020-12-03 MED ORDER — CEFAZOLIN SODIUM-DEXTROSE 1-4 GM/50ML-% IV SOLN
1.0000 g | Freq: Four times a day (QID) | INTRAVENOUS | Status: AC
  Administered 2020-12-03 (×2): 1 g via INTRAVENOUS
  Filled 2020-12-03 (×2): qty 50

## 2020-12-03 MED ORDER — FUROSEMIDE 20 MG PO TABS: 20.0000 mg | ORAL_TABLET | Freq: Every day | ORAL | Status: DC | PRN

## 2020-12-03 MED ORDER — HYDROMORPHONE HCL 2 MG PO TABS
2.0000 mg | ORAL_TABLET | ORAL | Status: DC | PRN
  Administered 2020-12-03 – 2020-12-04 (×5): 2 mg via ORAL
  Filled 2020-12-03 (×5): qty 1

## 2020-12-03 MED ORDER — HYDROMORPHONE HCL 1 MG/ML IJ SOLN
0.5000 mg | INTRAMUSCULAR | Status: DC | PRN
  Administered 2020-12-03 (×2): 1 mg via INTRAVENOUS
  Filled 2020-12-03 (×2): qty 1

## 2020-12-03 MED ORDER — POTASSIUM CHLORIDE CRYS ER 10 MEQ PO TBCR
10.0000 meq | EXTENDED_RELEASE_TABLET | Freq: Every day | ORAL | Status: DC
  Administered 2020-12-03: 10 meq via ORAL
  Filled 2020-12-03: qty 1

## 2020-12-03 MED ORDER — ROSUVASTATIN CALCIUM 20 MG PO TABS
20.0000 mg | ORAL_TABLET | Freq: Every day | ORAL | Status: DC
  Administered 2020-12-03: 20 mg via ORAL
  Filled 2020-12-03: qty 1

## 2020-12-03 MED ORDER — BUPIVACAINE IN DEXTROSE 0.75-8.25 % IT SOLN
INTRATHECAL | Status: DC | PRN
  Administered 2020-12-03: 2 mL via INTRATHECAL

## 2020-12-03 MED ORDER — 0.9 % SODIUM CHLORIDE (POUR BTL) OPTIME
TOPICAL | Status: DC | PRN
  Administered 2020-12-03: 1000 mL

## 2020-12-03 MED ORDER — POVIDONE-IODINE 10 % EX SWAB
2.0000 "application " | Freq: Once | CUTANEOUS | Status: AC
  Administered 2020-12-03: 2 via TOPICAL

## 2020-12-03 MED ORDER — POLYVINYL ALCOHOL 1.4 % OP SOLN
1.0000 [drp] | OPHTHALMIC | Status: DC | PRN
  Filled 2020-12-03 (×2): qty 15

## 2020-12-03 MED ORDER — FENTANYL CITRATE (PF) 100 MCG/2ML IJ SOLN
INTRAMUSCULAR | Status: AC
  Administered 2020-12-03: 50 ug via INTRAVENOUS
  Filled 2020-12-03: qty 2

## 2020-12-03 MED ORDER — METHOCARBAMOL 1000 MG/10ML IJ SOLN
500.0000 mg | Freq: Four times a day (QID) | INTRAVENOUS | Status: DC | PRN
  Filled 2020-12-03: qty 5

## 2020-12-03 MED ORDER — FENTANYL CITRATE (PF) 100 MCG/2ML IJ SOLN: 25.0000 ug | INTRAMUSCULAR | Status: DC | PRN

## 2020-12-03 MED ORDER — MIDAZOLAM HCL 2 MG/2ML IJ SOLN
INTRAMUSCULAR | Status: AC
  Administered 2020-12-03: 1 mg via INTRAVENOUS
  Filled 2020-12-03: qty 2

## 2020-12-03 MED ORDER — ORAL CARE MOUTH RINSE: 15.0000 mL | Freq: Once | OROMUCOSAL | Status: AC

## 2020-12-03 MED ORDER — ONDANSETRON HCL 4 MG/2ML IJ SOLN
INTRAMUSCULAR | Status: DC | PRN
  Administered 2020-12-03: 4 mg via INTRAVENOUS

## 2020-12-03 MED ORDER — STERILE WATER FOR IRRIGATION IR SOLN
Status: DC | PRN
  Administered 2020-12-03: 2000 mL

## 2020-12-03 MED ORDER — ALPRAZOLAM 0.25 MG PO TABS
0.2500 mg | ORAL_TABLET | Freq: Every evening | ORAL | Status: DC | PRN
  Administered 2020-12-03: 0.25 mg via ORAL
  Filled 2020-12-03: qty 1

## 2020-12-03 MED ORDER — IRBESARTAN 150 MG PO TABS
300.0000 mg | ORAL_TABLET | Freq: Every day | ORAL | Status: DC
  Administered 2020-12-04: 300 mg via ORAL
  Filled 2020-12-03: qty 2

## 2020-12-03 MED ORDER — LEVALBUTEROL TARTRATE 45 MCG/ACT IN AERO
1.0000 | INHALATION_SPRAY | RESPIRATORY_TRACT | Status: DC | PRN
  Filled 2020-12-03: qty 15

## 2020-12-03 MED ORDER — OXYCODONE HCL 5 MG PO TABS
5.0000 mg | ORAL_TABLET | ORAL | Status: DC | PRN
  Filled 2020-12-03: qty 1

## 2020-12-03 MED ORDER — EPHEDRINE SULFATE-NACL 50-0.9 MG/10ML-% IV SOSY
PREFILLED_SYRINGE | INTRAVENOUS | Status: DC | PRN
  Administered 2020-12-03: 5 mg via INTRAVENOUS
  Administered 2020-12-03: 10 mg via INTRAVENOUS

## 2020-12-03 MED ORDER — PHENYLEPHRINE HCL-NACL 10-0.9 MG/250ML-% IV SOLN
INTRAVENOUS | Status: DC | PRN
  Administered 2020-12-03: 60 ug/min via INTRAVENOUS

## 2020-12-03 MED ORDER — POLYETHYLENE GLYCOL 3350 17 G PO PACK: 17.0000 g | PACK | Freq: Every day | ORAL | Status: DC | PRN

## 2020-12-03 MED ORDER — BUPIVACAINE-EPINEPHRINE (PF) 0.25% -1:200000 IJ SOLN
INTRAMUSCULAR | Status: AC
  Filled 2020-12-03: qty 30

## 2020-12-03 SURGICAL SUPPLY — 55 items
BAG ZIPLOCK 12X15 (MISCELLANEOUS) IMPLANT
BASEPLATE TIBIAL UNV SZ5 (Plate) ×2 IMPLANT
BENZOIN TINCTURE PRP APPL 2/3 (GAUZE/BANDAGES/DRESSINGS) IMPLANT
BLADE SAG 18X100X1.27 (BLADE) IMPLANT
BLADE SURG SZ10 CARB STEEL (BLADE) ×4 IMPLANT
BNDG ELASTIC 6X15 VLCR STRL LF (GAUZE/BANDAGES/DRESSINGS) ×2 IMPLANT
BNDG ELASTIC 6X5.8 VLCR STR LF (GAUZE/BANDAGES/DRESSINGS) ×2 IMPLANT
BOWL SMART MIX CTS (DISPOSABLE) IMPLANT
CEMENT BONE SIMPLEX SPEEDSET (Cement) ×4 IMPLANT
COVER SURGICAL LIGHT HANDLE (MISCELLANEOUS) ×2 IMPLANT
COVER WAND RF STERILE (DRAPES) IMPLANT
CUFF TOURN SGL QUICK 34 (TOURNIQUET CUFF) ×2
CUFF TRNQT CYL 34X4.125X (TOURNIQUET CUFF) ×1 IMPLANT
DECANTER SPIKE VIAL GLASS SM (MISCELLANEOUS) IMPLANT
DRAPE U-SHAPE 47X51 STRL (DRAPES) ×2 IMPLANT
DRSG PAD ABDOMINAL 8X10 ST (GAUZE/BANDAGES/DRESSINGS) ×2 IMPLANT
DURAPREP 26ML APPLICATOR (WOUND CARE) ×2 IMPLANT
ELECT BLADE TIP CTD 4 INCH (ELECTRODE) ×2 IMPLANT
ELECT REM PT RETURN 15FT ADLT (MISCELLANEOUS) ×2 IMPLANT
FEMORAL PEG DISTAL FIXATION (Orthopedic Implant) ×2 IMPLANT
FEMORAL POST SZ 6 (Orthopedic Implant) ×2 IMPLANT
GAUZE SPONGE 4X4 12PLY STRL (GAUZE/BANDAGES/DRESSINGS) ×2 IMPLANT
GAUZE XEROFORM 1X8 LF (GAUZE/BANDAGES/DRESSINGS) ×2 IMPLANT
GLOVE BIO SURGEON STRL SZ7.5 (GLOVE) ×2 IMPLANT
GLOVE ECLIPSE 8.0 STRL XLNG CF (GLOVE) ×2 IMPLANT
GLOVE SRG 8 PF TXTR STRL LF DI (GLOVE) ×2 IMPLANT
GLOVE SURG UNDER POLY LF SZ8 (GLOVE) ×4
GOWN STRL REUS W/TWL XL LVL3 (GOWN DISPOSABLE) ×4 IMPLANT
HANDPIECE INTERPULSE COAX TIP (DISPOSABLE) ×2
HOLDER FOLEY CATH W/STRAP (MISCELLANEOUS) IMPLANT
IMMOBILIZER KNEE 20 (SOFTGOODS) ×2
IMMOBILIZER KNEE 20 THIGH 36 (SOFTGOODS) ×1 IMPLANT
INSERT TIB BEAR X3 (Insert) ×2 IMPLANT
KIT TURNOVER KIT A (KITS) IMPLANT
NS IRRIG 1000ML POUR BTL (IV SOLUTION) ×2 IMPLANT
PACK TOTAL KNEE CUSTOM (KITS) ×2 IMPLANT
PADDING CAST COTTON 6X4 STRL (CAST SUPPLIES) ×4 IMPLANT
PADDING CAST SYN 6 (CAST SUPPLIES) ×1
PADDING CAST SYNTHETIC 6X4 NS (CAST SUPPLIES) ×1 IMPLANT
PATELLA 32MMX10MM (Knees) ×2 IMPLANT
PENCIL SMOKE EVACUATOR (MISCELLANEOUS) IMPLANT
PIN FLUTED HEDLESS FIX 3.5X1/8 (PIN) ×2 IMPLANT
PROTECTOR NERVE ULNAR (MISCELLANEOUS) ×2 IMPLANT
SET HNDPC FAN SPRY TIP SCT (DISPOSABLE) ×1 IMPLANT
SET PAD KNEE POSITIONER (MISCELLANEOUS) ×2 IMPLANT
STAPLER VISISTAT 35W (STAPLE) ×2 IMPLANT
STRIP CLOSURE SKIN 1/2X4 (GAUZE/BANDAGES/DRESSINGS) IMPLANT
SUT MNCRL AB 4-0 PS2 18 (SUTURE) IMPLANT
SUT VIC AB 0 CT1 27 (SUTURE) ×2
SUT VIC AB 0 CT1 27XBRD ANTBC (SUTURE) ×1 IMPLANT
SUT VIC AB 1 CT1 36 (SUTURE) ×4 IMPLANT
SUT VIC AB 2-0 CT1 27 (SUTURE) ×4
SUT VIC AB 2-0 CT1 TAPERPNT 27 (SUTURE) ×2 IMPLANT
TRAY FOLEY MTR SLVR 16FR STAT (SET/KITS/TRAYS/PACK) ×2 IMPLANT
WATER STERILE IRR 1000ML POUR (IV SOLUTION) ×2 IMPLANT

## 2020-12-03 NOTE — Anesthesia Procedure Notes (Signed)
Spinal  Patient location during procedure: OR Staffing Performed: resident/CRNA  Resident/CRNA: Jalayna Josten A, CRNA Preanesthetic Checklist Completed: patient identified, IV checked, site marked, risks and benefits discussed, surgical consent, monitors and equipment checked, pre-op evaluation and timeout performed Spinal Block Patient position: sitting Prep: DuraPrep Patient monitoring: heart rate, cardiac monitor, continuous pulse ox and blood pressure Approach: midline Location: L3-4 Injection technique: single-shot Needle Needle type: Pencan  Needle gauge: 24 G Needle length: 10 cm Assessment Sensory level: T4     

## 2020-12-03 NOTE — Evaluation (Signed)
Physical Therapy Evaluation Patient Details Name: [name] [name] MRN: [id number] DOB: Sep 04, 1951 Today's Date: 12/03/2020   History of Present Illness  Pt s/p L TKR and with hx of R TKr in 2019, DM, MI, A-fib, CAD  Clinical Impression  Pt s/p L TKR and presents with decreased L LE strength/ROM and post op pain limiting functional mobility.  Pt should progress to dc home with family assist and follow up HHPT.    Follow Up Recommendations Home health PT;Follow surgeon's recommendation for DC plan and follow-up therapies    Equipment Recommendations  None recommended by PT    Recommendations for Other Services       Precautions / Restrictions Precautions Precautions: Fall Restrictions Weight Bearing Restrictions: No Other Position/Activity Restrictions: WBAT      Mobility  Bed Mobility Overal bed mobility: Needs Assistance Bed Mobility: Supine to Sit     Supine to sit: Min assist     General bed mobility comments: cues for sequence and use of R LE to self assist    Transfers Overall transfer level: Needs assistance Equipment used: Rolling walker (2 wheeled) Transfers: Sit to/from Stand Sit to Stand: Min assist;From elevated surface         General transfer comment: cues for LE management and use of UEs to self assist  Ambulation/Gait Ambulation/Gait assistance: Min assist;+2 safety/equipment Gait Distance (Feet): 21 Feet Assistive device: Rolling walker (2 wheeled) Gait Pattern/deviations: Step-to pattern;Decreased step length - right;Decreased step length - left;Trunk flexed;Steppage Gait velocity: decr   General Gait Details: cues for sequence, posture and position from [employer]            Wheelchair Mobility    Modified Rankin (Stroke Patients Only)       Balance Overall balance assessment: Needs assistance Sitting-balance support: No upper extremity supported;Feet supported Sitting balance-Leahy Scale: Good     Standing balance  support: Bilateral upper extremity supported Standing balance-Leahy Scale: Poor                               Pertinent Vitals/Pain Pain Assessment: 0-10 Pain Score: 6  Pain Location: L knee Pain Descriptors / Indicators: Aching;Sore Pain Intervention(s): Limited activity within patient's tolerance;Monitored during session;Premedicated before session;Ice applied    Home Living Family/patient expects to be discharged to:: Private residence Living Arrangements: Alone Available Help at Discharge: Family;Personal care attendant Type of Home: House Home Access: Stairs to enter Entrance Stairs-Rails: Psychiatric nurse of Steps: 4 Home Layout: Two level;Able to live on main level with bedroom/bathroom Home Equipment: Gilford Rile - 2 wheels;Walker - 4 wheels;Bedside commode;Hospital bed;Other (comment) (Lift chair)      Prior Function Level of Independence: Independent with assistive device(s)         Comments: RW as needed     Hand Dominance        Extremity/Trunk Assessment   Upper Extremity Assessment Upper Extremity Assessment: Overall WFL for tasks assessed    Lower Extremity Assessment Lower Extremity Assessment: LLE deficits/detail    Cervical / Trunk Assessment Cervical / Trunk Assessment: Normal  Communication   Communication: No difficulties  Cognition Arousal/Alertness: Awake/alert Behavior During Therapy: WFL for tasks assessed/performed Overall Cognitive Status: Within Functional Limits for tasks assessed  General Comments      Exercises Total Joint Exercises Ankle Circles/Pumps: AROM;Both;15 reps;Supine Heel Slides: AROM;Left;Supine;5 reps   Assessment/Plan    PT Assessment Patient needs continued PT services  PT Problem List Decreased strength;Decreased range of motion;Decreased activity tolerance;Decreased balance;Decreased mobility;Decreased knowledge of use of  DME;Pain       PT Treatment Interventions DME instruction;Gait training;Stair training;Functional mobility training;Therapeutic activities;Therapeutic exercise;Balance training;Patient/family education    PT Goals (Current goals can be found in the Care Plan section)  Acute Rehab PT Goals Patient Stated Goal: Regain IND PT Goal Formulation: With patient Time For Goal Achievement: 12/10/20 Potential to Achieve Goals: Good    Frequency 7X/week   Barriers to discharge        Co-evaluation               AM-PAC PT "6 Clicks" Mobility  Outcome Measure Help needed turning from your back to your side while in a flat bed without using bedrails?: A Little Help needed moving from lying on your back to sitting on the side of a flat bed without using bedrails?: A Little Help needed moving to and from a bed to a chair (including a wheelchair)?: A Little Help needed standing up from a chair using your arms (e.g., wheelchair or bedside chair)?: A Little Help needed to walk in hospital room?: A Little Help needed climbing 3-5 steps with a railing? : A Lot 6 Click Score: 17    End of Session Equipment Utilized During Treatment: Gait belt Activity Tolerance: Patient tolerated treatment well Patient left: in chair;with call bell/phone within reach;with chair alarm set;with family/visitor present Nurse Communication: Mobility status PT Visit Diagnosis: Difficulty in walking, not elsewhere classified (R26.2);Pain Pain - Right/Left: Left Pain - part of body: Knee    Time: 1655-1735 PT Time Calculation (min) (ACUTE ONLY): 40 min   Charges:   PT Evaluation $PT Eval Low Complexity: 1 Low PT Treatments $Gait Training: 8-22 mins        Debe Coder PT Acute Rehabilitation Services Pager [phone] Office [phone]   Anmarie Fukushima 12/03/2020, 5:55 PM

## 2020-12-03 NOTE — Op Note (Signed)
NAME: KISHAN, [place] MEDICAL RECORD ON:[id number] ACCOUNT [id number] DATE OF BIRTH:11-28-1950 FACILITY: [facility] LOCATION: [facility] PHYSICIAN:Dacia Capers Kerry Fort, MD  OPERATIVE REPORT  DATE OF PROCEDURE:  12/03/2020  PREOPERATIVE DIAGNOSIS:  Primary osteoarthritis and degenerative joint disease, left knee.  POSTOPERATIVE DIAGNOSIS:  Primary osteoarthritis and degenerative joint disease, left knee.  PROCEDURE:  Left total knee arthroplasty.  IMPLANTS:  Stryker Triathlon cemented knee system with size 6 femur, size 5 universal tibial baseplate, 13 mm fixed bearing polyethylene insert, size 32 patellar button.  SURGEON:  Lind Guest. Ninfa Linden, MD  ASSISTANT:  Erskine Emery, PA-C.  ANESTHESIA: 1.  Left lower extremity adductor canal block. 2.  Spinal. 3.  Local with 0.25% Marcaine with epinephrine.  ANTIBIOTICS:  Two grams IV Ancef.  ESTIMATED BLOOD LOSS:  Less than 100 mL.  TOURNIQUET TIME:  One hour.  COMPLICATIONS:  None.  INDICATIONS:  The patient is a 70 year old gentleman well known to me.  I have actually replaced his right knee before.  His left knee has severe end-stage arthritis and at this point it is detrimentally affecting his mobility, his quality of life and  his activities of daily living to the point he does wish to proceed with total knee arthroplasty on the left side.  He knows that given his cardiac history and his low ejection fraction he is at high risk for surgery.  He has weighed this heavily and  given the fact that he did well with his other knee and the fact that he has miserable quality of life due to the severe arthritis in his knee he is really weighed the risks and benefits of the surgery and does wish to proceed.  He understands the risk  of acute blood loss anemia, nerve or vessel injury, infection, DVT and implant failure.  He understands the perioperative cardiac risk as well.  He understands our goals are to decrease pain, improve  mobility and overall improve quality of life.  DESCRIPTION OF PROCEDURE:  After informed consent was obtained and appropriate left knee was marked, he was brought to the operating room and sat up on the operating table and spinal anesthesia was then obtained.  He was laid in the supine position.  Of  note, an adductor canal block was obtained on the left lower extremity prior to bringing his back.  A nonsterile tourniquet was placed around his upper left thigh.  A Foley catheter was placed as well.  His left thigh, knee, leg, and ankle were prepped  and draped with DuraPrep and sterile drapes including a sterile stockinette.  A time-out was called.  He was identified as correct patient, correct left knee.  I then made an incision over the patella and carried this proximally and distally.  I  dissected down the knee joint and carried out a medial parapatellar arthrotomy, finding a very large joint effusion and significant cartilage wear in all 3 compartments of the knee.  With the knee in a flexed position, we removed remnants of ACL, PCL,  medial and lateral meniscus.  We then made our proximal tibia cut using the extramedullary cutting guide correcting for varus and valgus and a neutral slope as well as taking 9 mm off the high side.  We made this cut without difficulty.  We then used an  intramedullary drill for our distal femoral cutting guide setting this for a left knee at 5 degrees externally rotated for a 10 mm distal femoral cut.  We made this cut without difficulty and brought  the knee back down to full extension and with 11 mm  extension block and achieved full extension.  We went back to the femur and put femoral sizing guide based off the epicondylar axis and [place] line.  Based off this, we chose a size 6 femur.  This actually corresponded with his other side.  We put a  4-in-1 cutting block for a size 6 femur, made our anterior and posterior cuts, followed by our chamfer cuts.  We then  made our femoral box cut.  Attention was then turned back to the tibia.  We set our tibial tray for the rotation off the tibial tubercle  and the femur.  We chose a size 5 tibial tray for coverage.  We did our keel punch off of this and then drilled for universal baseplate as well.  With a size 5 trial tibia, we trialed a 6 left femur and trialed a 13 mm fixed bearing polyethylene insert  and we had full extension and full flexion with stability on exam.  We then made our patellar cut and drilled 3 holes for a size 32 patellar button.  We then removed all instrumentation from the knee and irrigated the knee with normal saline solution  using pulsatile lavage.  We dried the knee real well and placed our Marcaine with epinephrine around the arthrotomy.  We then mixed our cement.  With the knee in a flexed position, dried the knee real well and then cemented our Stryker Triathlon tibial  universal baseplate, size 5 followed by our size 6 left femur.  We placed our real fixed bearing polyethylene insert, which was a 13 mm thickness.  We removed all cement debris from the knee and brought the knee back down to full extension and held the  knee compressed to full extension.  We cemented our patellar button as well.  We removed excess cement debris from the knee.  Once this cement had hardened, we let the tourniquet down and hemostasis was obtained with electrocautery.  I put the knee  through several more cycles of motion.  We then closed the arthrotomy with interrupted #1 Vicryl suture followed by 0 Vicryl to close deep tissue and 2-0 Vicryl to close the subcutaneous tissue.  The skin was reapproximated with staples.  A well-padded  sterile dressing was applied.  He was taken to recovery room in stable condition with all final counts being correct.  There were no complications noted.  Of note Benita Stabile, PA-C did assist during the entire case and his assistance was crucial for  facilitating all aspects of this  case.  HN/NUANCE  D:12/03/2020 T:12/03/2020 JOB:[id number]

## 2020-12-03 NOTE — Plan of Care (Signed)
  Problem: Education: Goal: Knowledge of General Education information will improve Description: Including pain rating scale, medication(s)/side effects and non-pharmacologic comfort measures Outcome: Progressing   Problem: Activity: Goal: Risk for activity intolerance will decrease Outcome: Progressing   Problem: Nutrition: Goal: Adequate nutrition will be maintained Outcome: Progressing   Problem: Elimination: Goal: Will not experience complications related to bowel motility Outcome: Progressing   Problem: Safety: Goal: Ability to remain free from injury will improve Outcome: Progressing   Problem: Education: Goal: Knowledge of the prescribed therapeutic regimen will improve Outcome: Progressing   Problem: Activity: Goal: Ability to avoid complications of mobility impairment will improve Outcome: Progressing   Problem: Pain Management: Goal: Pain level will decrease with appropriate interventions Outcome: Progressing

## 2020-12-03 NOTE — Anesthesia Postprocedure Evaluation (Signed)
Anesthesia Post Note  Patient: [name] [name]  Procedure(s) Performed: LEFT TOTAL KNEE ARTHROPLASTY (Left Knee)     Patient location during evaluation: PACU Anesthesia Type: Spinal Level of consciousness: oriented and awake and alert Pain management: pain level controlled Vital Signs Assessment: post-procedure vital signs reviewed and stable Respiratory status: spontaneous breathing, respiratory function stable and nonlabored ventilation Cardiovascular status: blood pressure returned to baseline and stable Postop Assessment: no headache, no backache, no apparent nausea or vomiting, spinal receding and patient able to bend at knees Anesthetic complications: no   No complications documented.  Last Vitals:  Vitals:   12/03/20 1315 12/03/20 1330  BP: 97/66 99/72  Pulse: 73 69  Resp: 15 16  Temp:    SpO2: 100% 96%    Last Pain:  Vitals:   12/03/20 0836  TempSrc:   PainSc: 0-No pain                 Sahaana Weitman A.

## 2020-12-03 NOTE — Anesthesia Procedure Notes (Addendum)
Arterial Line Insertion Performed by: Josephine Igo, MD, anesthesiologist  Patient location: Pre-op. Preanesthetic checklist: patient identified, IV checked, site marked, risks and benefits discussed, surgical consent, monitors and equipment checked, pre-op evaluation, timeout performed and anesthesia consent Lidocaine 1% used for infiltration Left, radial was placed Catheter size: 20 G Hand hygiene performed  and maximum sterile barriers used  Allen's test indicative of satisfactory collateral circulation Attempts: 3 Procedure performed without using ultrasound guided technique. Ultrasound Notes:anatomy identified Following insertion, Biopatch and dressing applied. Post procedure assessment: normal  Patient tolerated the procedure well with no immediate complications.

## 2020-12-03 NOTE — Brief Op Note (Signed)
12/03/2020  11:44 AM  PATIENT:  [name] [name]  70 y.o. male  PRE-OPERATIVE DIAGNOSIS:  Left Knee Osteoarthritis  POST-OPERATIVE DIAGNOSIS:  Left Knee Osteoarthritis  PROCEDURE:  Procedure(s): LEFT TOTAL KNEE ARTHROPLASTY (Left)  SURGEON:  Surgeon(s) and Role:    Mcarthur Rossetti, MD - Primary  PHYSICIAN ASSISTANT:  Benita Stabile, PA-C  ANESTHESIA:   local, regional and spinal  EBL:  100 mL   COUNTS:  YES  TOURNIQUET:   Total Tourniquet Time Documented: Thigh (Left) - 60 minutes Total: Thigh (Left) - 60 minutes   DICTATION: .Other Dictation: Dictation Number [id number]  PLAN OF CARE: Admit for overnight observation  PATIENT DISPOSITION:  PACU - hemodynamically stable.   Delay start of Pharmacological VTE agent (>24hrs) due to surgical blood loss or risk of bleeding: no

## 2020-12-03 NOTE — Care Plan (Signed)
Ortho Bundle Case Management Note  Patient Details  Name: [name] [name] MRN: [id number] Date of Birth: [birth year]    [facility] call to patient to discuss his upcoming Left total knee arthroplasty with Dr. Ninfa Linden. The patient is an Ortho bundle through [facility] and is agreeable to case management. He has a FWW as well as elevated toilet seats in the home. No DME needed. Anticipate HHPT will be needed after a short hospital stay. Choice provided and referral made to [facility]. Reviewed all post op care instructions. Patient requested [facility] for OPPT when appropriate. RNCM will help schedule this. Will continue to follow for needs.          DME Arranged:   (Patient has FWW as well as elevated toilet seats in his home. Requested a Don Joy ice wrap- discussed with MD if [facility] has in stock in [place] to provide.) DME Agency:     HH Arranged:  PT [place]:  [facility] (now [facility])  Additional Comments: Please contact me with any questions of if this plan should need to change.  Jamse Arn, RN, BSN, [employer]  [phone] 12/03/2020, 7:35 AM

## 2020-12-03 NOTE — Interval H&P Note (Signed)
History and Physical Interval Note: The patient understands that he is here today for a left total knee arthroplasty to treat the pain from his left knee osteoarthritis.  There has been no interval acute change in his medical status.  Please see recent H&P.  The risks and benefits of surgery have been explained in detail and informed consent is obtained.  The left knee has been marked.  12/03/2020 9:07 AM  [name] [name]  has presented today for surgery, with the diagnosis of Left Knee Osteoarthritis.  The various methods of treatment have been discussed with the patient and family. After consideration of risks, benefits and other options for treatment, the patient has consented to  Procedure(s): LEFT TOTAL KNEE ARTHROPLASTY (Left) as a surgical intervention.  The patient's history has been reviewed, patient examined, no change in status, stable for surgery.  I have reviewed the patient's chart and labs.  Questions were answered to the patient's satisfaction.     Mcarthur Rossetti

## 2020-12-03 NOTE — Progress Notes (Signed)
AssistedDr. Foster with left, ultrasound guided, adductor canal block. Side rails up, monitors on throughout procedure. See vital signs in flow sheet. Tolerated Procedure well.  

## 2020-12-03 NOTE — Anesthesia Procedure Notes (Signed)
Anesthesia Regional Block: Adductor canal block   Pre-Anesthetic Checklist: ,, timeout performed, Correct Patient, Correct Site, Correct Laterality, Correct Procedure, Correct Position, site marked, Risks and benefits discussed,  Surgical consent,  Pre-op evaluation,  At surgeon's request and post-op pain management  Laterality: Left  Prep: chloraprep       Needles:  Injection technique: Single-shot  Needle Type: Echogenic Stimulator Needle     Needle Length: 9cm  Needle Gauge: 21   Needle insertion depth: 7 cm   Additional Needles:   Narrative:  Start time: 12/03/2020 8:12 AM End time: 12/03/2020 8:17 AM Injection made incrementally with aspirations every 5 mL.  Performed by: Personally  Anesthesiologist: Josephine Igo, MD  Additional Notes: Timeout performed. Patient sedated. Relevant anatomy ID'd using [place]. Incremental 2-4ml injection of LA with frequent aspiration. Patient tolerated procedure well.        Left Adductor Canal Block

## 2020-12-03 NOTE — Transfer of Care (Signed)
Immediate Anesthesia Transfer of Care Note  Patient: [name] [name]  Procedure(s) Performed: LEFT TOTAL KNEE ARTHROPLASTY (Left Knee)  Patient Location: PACU  Anesthesia Type:Spinal  Level of Consciousness: awake, alert  and oriented  Airway & Oxygen Therapy: Patient Spontanous Breathing and Patient connected to face mask  Post-op Assessment: Report given to RN and Post -op Vital signs reviewed and stable  Post vital signs: Reviewed and stable  Last Vitals:  Vitals Value Taken Time  BP    Temp    Pulse    Resp    SpO2      Last Pain:  Vitals:   12/03/20 0836  TempSrc:   PainSc: 0-No pain      Patients Stated Pain Goal: 3 (59/16/38 4665)  Complications: No complications documented.

## 2020-12-04 DIAGNOSIS — Z7984 Long term (current) use of oral hypoglycemic drugs: Secondary | ICD-10-CM | POA: Diagnosis not present

## 2020-12-04 DIAGNOSIS — Z87891 Personal history of nicotine dependence: Secondary | ICD-10-CM | POA: Diagnosis not present

## 2020-12-04 DIAGNOSIS — M1712 Unilateral primary osteoarthritis, left knee: Secondary | ICD-10-CM | POA: Diagnosis not present

## 2020-12-04 DIAGNOSIS — E119 Type 2 diabetes mellitus without complications: Secondary | ICD-10-CM | POA: Diagnosis not present

## 2020-12-04 DIAGNOSIS — Z955 Presence of coronary angioplasty implant and graft: Secondary | ICD-10-CM | POA: Diagnosis not present

## 2020-12-04 DIAGNOSIS — Z79899 Other long term (current) drug therapy: Secondary | ICD-10-CM | POA: Diagnosis not present

## 2020-12-04 DIAGNOSIS — Z794 Long term (current) use of insulin: Secondary | ICD-10-CM | POA: Diagnosis not present

## 2020-12-04 DIAGNOSIS — Z7982 Long term (current) use of aspirin: Secondary | ICD-10-CM | POA: Diagnosis not present

## 2020-12-04 DIAGNOSIS — I2510 Atherosclerotic heart disease of native coronary artery without angina pectoris: Secondary | ICD-10-CM | POA: Diagnosis not present

## 2020-12-04 DIAGNOSIS — Z96651 Presence of right artificial knee joint: Secondary | ICD-10-CM | POA: Diagnosis not present

## 2020-12-04 DIAGNOSIS — I10 Essential (primary) hypertension: Secondary | ICD-10-CM | POA: Diagnosis not present

## 2020-12-04 LAB — CBC
HCT: 45.8 % (ref 39.0–52.0)
Hemoglobin: 14 g/dL (ref 13.0–17.0)
MCH: 26.7 pg (ref 26.0–34.0)
MCHC: 30.6 g/dL (ref 30.0–36.0)
MCV: 87.4 fL (ref 80.0–100.0)
Platelets: 125 10*3/uL — ABNORMAL LOW (ref 150–400)
RBC: 5.24 MIL/uL (ref 4.22–5.81)
RDW: 17.5 % — ABNORMAL HIGH (ref 11.5–15.5)
WBC: 10.4 10*3/uL (ref 4.0–10.5)
nRBC: 0 % (ref 0.0–0.2)

## 2020-12-04 LAB — BASIC METABOLIC PANEL
Anion gap: 11 (ref 5–15)
BUN: 27 mg/dL — ABNORMAL HIGH (ref 8–23)
CO2: 21 mmol/L — ABNORMAL LOW (ref 22–32)
Calcium: 8.4 mg/dL — ABNORMAL LOW (ref 8.9–10.3)
Chloride: 105 mmol/L (ref 98–111)
Creatinine, Ser: 1.49 mg/dL — ABNORMAL HIGH (ref 0.61–1.24)
GFR, Estimated: 51 mL/min — ABNORMAL LOW (ref >60)
Glucose, Bld: 199 mg/dL — ABNORMAL HIGH (ref 70–99)
Potassium: 4.1 mmol/L (ref 3.5–5.1)
Sodium: 137 mmol/L (ref 135–145)

## 2020-12-04 MED ORDER — METHOCARBAMOL 500 MG PO TABS: 500.0000 mg | ORAL_TABLET | Freq: Four times a day (QID) | ORAL | 1 refills | Status: DC | PRN

## 2020-12-04 MED ORDER — HYDROMORPHONE HCL 2 MG PO TABS: 2.0000 mg | ORAL_TABLET | ORAL | 0 refills | Status: DC | PRN

## 2020-12-04 NOTE — Progress Notes (Signed)
Subjective: 1 Day Post-Op Procedure(s) (LRB): LEFT TOTAL KNEE ARTHROPLASTY (Left) Patient reports pain as moderate.    Objective: Vital signs in last 24 hours: Temp:  [97.5 F (36.4 C)-98.5 F (36.9 C)] 97.6 F (36.4 C) (01/15 0601) Pulse Rate:  [56-94] 94 (01/15 0601) Resp:  [10-20] 16 (01/15 0601) BP: (91-138)/(49-89) 138/83 (01/15 0601) SpO2:  [94 %-100 %] 99 % (01/15 0601) Arterial Line BP: (102-111)/(53-59) 111/59 (01/14 1300) Weight:  [105.2 kg] 105.2 kg (01/14 1419)  Intake/Output from previous day: 01/14 0701 - 01/15 0700 In: 1620 [P.O.:720; I.V.:900] Out: 1850 [Urine:1750; Blood:100] Intake/Output this shift: Total I/O In: 120 [P.O.:120] Out: 300 [Urine:300]  Recent Labs    12/04/20 0325  HGB 14.0   Recent Labs    12/04/20 0325  WBC 10.4  RBC 5.24  HCT 45.8  PLT 125*   Recent Labs    12/04/20 0325  NA 137  K 4.1  CL 105  CO2 21*  BUN 27*  CREATININE 1.49*  GLUCOSE 199*  CALCIUM 8.4*   No results for input(s): LABPT, INR in the last 72 hours.  Sensation intact distally Intact pulses distally Dorsiflexion/Plantar flexion intact Incision: dressing C/D/I No cellulitis present Compartment soft   Assessment/Plan: 1 Day Post-Op Procedure(s) (LRB): LEFT TOTAL KNEE ARTHROPLASTY (Left) Up with therapy Discharge home with home health this afternoon.    Patient's anticipated LOS is less than 2 midnights, meeting these requirements: - Younger than 40 - Lives within 1 hour of care - Has a competent adult at home to recover with post-op recover - NO history of  - Chronic pain requiring opiods  - Diabetes  - Coronary Artery Disease  - Heart failure  - Heart attack  - Stroke  - DVT/VTE  - Cardiac arrhythmia  - Respiratory Failure/COPD  - Renal failure  - Anemia  - Advanced Liver disease       Mcarthur Rossetti 12/04/2020, 8:55 AM

## 2020-12-04 NOTE — Plan of Care (Signed)
  Problem: Clinical Measurements: Goal: Diagnostic test results will improve Outcome: Progressing   Problem: Clinical Measurements: Goal: Respiratory complications will improve Outcome: Progressing   Problem: Clinical Measurements: Goal: Cardiovascular complication will be avoided Outcome: Progressing   Problem: Coping: Goal: Level of anxiety will decrease Outcome: Progressing   Problem: Elimination: Goal: Will not experience complications related to bowel motility Outcome: Progressing   Problem: Elimination: Goal: Will not experience complications related to urinary retention Outcome: Progressing   Problem: Education: Goal: Individualized Educational Video(s) Outcome: Progressing

## 2020-12-04 NOTE — Progress Notes (Signed)
Physical Therapy Treatment Patient Details Name: [name] [name] MRN: [id number] DOB: Apr 08, 1951 Today's Date: 12/04/2020    History of Present Illness Pt s/p L TKR and with hx of R TKr in 2019, DM, MI, A-fib, CAD    PT Comments    Pt continues cooperative but with frequent c/o pain.  Pt up to ambulate limited distance, negotiated stairs, reviewed HEP in written form and performed LE dressing with assist.  Pt and step-son feel comfortable with pt current level of function and are eager for dc home.   Follow Up Recommendations  Home health PT;Follow surgeon's recommendation for DC plan and follow-up therapies     Equipment Recommendations  None recommended by PT    Recommendations for Other Services       Precautions / Restrictions Precautions Precautions: Fall Restrictions Weight Bearing Restrictions: No Other Position/Activity Restrictions: WBAT    Mobility  Bed Mobility               General bed mobility comments: Pt up in chair and requests back to same  Transfers Overall transfer level: Needs assistance Equipment used: Rolling walker (2 wheeled) Transfers: Sit to/from Stand Sit to Stand: Min guard         General transfer comment: cues for LE management and use of UEs to self assist  Ambulation/Gait Ambulation/Gait assistance: Min guard Gait Distance (Feet): 40 Feet Assistive device: Rolling walker (2 wheeled) Gait Pattern/deviations: Step-to pattern;Decreased step length - right;Decreased step length - left;Trunk flexed;Shuffle Gait velocity: decr   General Gait Details: cues for sequence, posture and position from RW   Stairs Stairs: Yes Stairs assistance: Min assist Stair Management: One rail Left;Step to pattern;Forwards;With crutches Number of Stairs: 3 General stair comments: cues for sequence and foot/crutch placement   Wheelchair Mobility    Modified Rankin (Stroke Patients Only)       Balance   Sitting-balance support: No  upper extremity supported;Feet supported Sitting balance-Leahy Scale: Good     Standing balance support: Bilateral upper extremity supported Standing balance-Leahy Scale: Poor                              Cognition Arousal/Alertness: Awake/alert Behavior During Therapy: WFL for tasks assessed/performed Overall Cognitive Status: Within Functional Limits for tasks assessed                                        Exercises      General Comments        Pertinent Vitals/Pain Pain Assessment: 0-10 Pain Score: 7  Pain Location: L knee with WB Pain Descriptors / Indicators: Aching;Grimacing;Guarding;Sore Pain Intervention(s): Limited activity within patient's tolerance;Monitored during session;Premedicated before session;Ice applied    Home Living                      Prior Function            PT Goals (current goals can now be found in the care plan section) Acute Rehab PT Goals Patient Stated Goal: Regain IND PT Goal Formulation: With patient Time For Goal Achievement: 12/10/20 Potential to Achieve Goals: Good Progress towards PT goals: Progressing toward goals    Frequency    7X/week      PT Plan Current plan remains appropriate    Co-evaluation  AM-PAC PT "6 Clicks" Mobility   Outcome Measure  Help needed turning from your back to your side while in a flat bed without using bedrails?: A Little Help needed moving from lying on your back to sitting on the side of a flat bed without using bedrails?: A Little Help needed moving to and from a bed to a chair (including a wheelchair)?: A Little Help needed standing up from a chair using your arms (e.g., wheelchair or bedside chair)?: A Little Help needed to walk in hospital room?: A Little Help needed climbing 3-5 steps with a railing? : A Little 6 Click Score: 18    End of Session Equipment Utilized During Treatment: Gait belt Activity Tolerance: Patient  tolerated treatment well;Patient limited by pain Patient left: in chair;with call bell/phone within reach;with chair alarm set;with family/visitor present Nurse Communication: Mobility status PT Visit Diagnosis: Difficulty in walking, not elsewhere classified (R26.2);Pain Pain - Right/Left: Left Pain - part of body: Knee     Time: 8469-6295 PT Time Calculation (min) (ACUTE ONLY): 38 min  Charges:  $Gait Training: 8-22 mins $Therapeutic Activity: 23-37 mins                     [place] Pager [phone] Office [phone]    Roselyne Stalnaker 12/04/2020, 4:26 PM

## 2020-12-04 NOTE — Plan of Care (Signed)
  Problem: Education: Goal: Knowledge of General Education information will improve Description: Including pain rating scale, medication(s)/side effects and non-pharmacologic comfort measures Outcome: Progressing   Problem: Health Behavior/Discharge Planning: Goal: Ability to manage health-related needs will improve Outcome: Progressing   Problem: Clinical Measurements: Goal: Ability to maintain clinical measurements within normal limits will improve Outcome: Progressing Goal: Will remain free from infection Outcome: Progressing Goal: Diagnostic test results will improve Outcome: Progressing Goal: Respiratory complications will improve Outcome: Progressing Goal: Cardiovascular complication will be avoided Outcome: Progressing   Problem: Activity: Goal: Risk for activity intolerance will decrease Outcome: Progressing   Problem: Nutrition: Goal: Adequate nutrition will be maintained Outcome: Progressing   Problem: Coping: Goal: Level of anxiety will decrease Outcome: Progressing   Problem: Elimination: Goal: Will not experience complications related to bowel motility Outcome: Progressing Goal: Will not experience complications related to urinary retention Outcome: Progressing   Problem: Pain Managment: Goal: General experience of comfort will improve Outcome: Progressing   Problem: Safety: Goal: Ability to remain free from injury will improve Outcome: Progressing   Problem: Skin Integrity: Goal: Risk for impaired skin integrity will decrease Outcome: Progressing   Problem: Education: Goal: Knowledge of the prescribed therapeutic regimen will improve Outcome: Progressing Goal: Individualized Educational Video(s) Outcome: Progressing   Problem: Activity: Goal: Ability to avoid complications of mobility impairment will improve Outcome: Progressing Goal: Range of joint motion will improve Outcome: Progressing   Problem: Clinical Measurements: Goal:  Postoperative complications will be avoided or minimized Outcome: Progressing   Problem: Pain Management: Goal: Pain level will decrease with appropriate interventions Outcome: Progressing   

## 2020-12-04 NOTE — Progress Notes (Signed)
Pt stable at time of d/c instructions and education given. No needs at time of d/c. Pt medicated for pain. Rn will continue to monitor.

## 2020-12-04 NOTE — Progress Notes (Signed)
Physical Therapy Treatment Patient Details Name: [name] [name] MRN: [id number] DOB: Mar 31, 1951 Today's Date: 12/04/2020    History of Present Illness Pt s/p L TKR and with hx of R TKr in 2019, DM, MI, A-fib, CAD    PT Comments    Pt very cooperative with completion of therex program but requiring increased time and multiple rest breaks.  Pt requests ice and rest prior to attempting ambulation 2* elevated pain level.   Follow Up Recommendations  Home health PT;Follow surgeon's recommendation for DC plan and follow-up therapies     Equipment Recommendations  None recommended by PT    Recommendations for Other Services       Precautions / Restrictions Precautions Precautions: Fall Restrictions Weight Bearing Restrictions: No Other Position/Activity Restrictions: WBAT    Mobility  Bed Mobility                  Transfers                    Ambulation/Gait                 Stairs             Wheelchair Mobility    Modified Rankin (Stroke Patients Only)       Balance                                            Cognition Arousal/Alertness: Awake/alert Behavior During Therapy: WFL for tasks assessed/performed Overall Cognitive Status: Within Functional Limits for tasks assessed                                        Exercises Total Joint Exercises Ankle Circles/Pumps: AROM;Both;15 reps;Supine Quad Sets: AROM;Both;10 reps;Supine Heel Slides: AAROM;Left;10 reps;Supine Straight Leg Raises: AAROM;AROM;Left;15 reps;Supine Goniometric ROM: -10 - 45    General Comments        Pertinent Vitals/Pain Pain Assessment: 0-10 Pain Score: 9  Pain Location: L knee with flexion Pain Descriptors / Indicators: Aching;Grimacing;Guarding;Sore Pain Intervention(s): Limited activity within patient's tolerance;Monitored during session;Premedicated before session;Ice applied    Home Living                       Prior Function            PT Goals (current goals can now be found in the care plan section) Acute Rehab PT Goals Patient Stated Goal: Regain IND PT Goal Formulation: With patient Time For Goal Achievement: 12/10/20 Potential to Achieve Goals: Good Progress towards PT goals: Progressing toward goals    Frequency    7X/week      PT Plan Current plan remains appropriate    Co-evaluation              AM-PAC PT "6 Clicks" Mobility   Outcome Measure  Help needed turning from your back to your side while in a flat bed without using bedrails?: A Little Help needed moving from lying on your back to sitting on the side of a flat bed without using bedrails?: A Little Help needed moving to and from a bed to a chair (including a wheelchair)?: A Little Help needed standing up from a chair using your arms (e.g., wheelchair or bedside chair)?: A Little Help  needed to walk in hospital room?: A Little Help needed climbing 3-5 steps with a railing? : A Lot 6 Click Score: 17    End of Session Equipment Utilized During Treatment: Gait belt Activity Tolerance: Patient tolerated treatment well Patient left: in chair;with call bell/phone within reach;with chair alarm set;with family/visitor present Nurse Communication: Mobility status PT Visit Diagnosis: Difficulty in walking, not elsewhere classified (R26.2);Pain Pain - Right/Left: Left Pain - part of body: Knee     Time: 0962-8366 PT Time Calculation (min) (ACUTE ONLY): 20 min  Charges:  $Therapeutic Exercise: 8-22 mins                     Debe Coder PT Acute Rehabilitation Services Pager [phone] Office [phone]    [name] [name] 12/04/2020, 12:41 PM

## 2020-12-04 NOTE — Discharge Instructions (Signed)

## 2020-12-04 NOTE — Discharge Summary (Signed)
Patient ID: [name] [name] MRN: [id number] DOB/AGE: Oct 11, 1951 70 y.o.  Admit date: 12/03/2020 Discharge date: 12/04/2020  Admission Diagnoses:  Principal Problem:   Arthritis of left knee Active Problems:   Status post total left knee replacement   Discharge Diagnoses:  Same  Past Medical History:  Diagnosis Date  . Bilateral leg edema 05/04/2017  . 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 CAD (coronary artery disease), native coronary artery 11/12/2018   Formatting of this note might be different from the original. S/p MI - Dr. Maurene Capes  . CAD, NATIVE VESSEL 06/15/2009   Qualifier: Diagnosis of  By: Haroldine Laws, MD, Eileen Stanford CAP (community acquired pneumonia) 08/17/2016  . Depressive disorder 11/12/2018  . Diabetes mellitus due to underlying condition with unspecified complications ([place]) 7/93/9030  . DM (diabetes mellitus) ([place])   . 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 Erythrocytosis 06/01/2020  . Essential hypertension 10/14/2007   Formatting of this note might be different from the original. Hypertension  10/1 IMO update  . Gout, unspecified 10/14/2007   Overview:  Gout  10/1 IMO update  Formatting of this note might be different from the original. Gout  10/1 IMO update  . History of colonic polyps 11/12/2018   Overview:  rpt colonoscopy 5/17; Dr. Haig Prophet of this note might be different from the original. rpt colonoscopy 5/17; Dr. Erlene Quan  . History of kidney stones   . History of nephrolithiasis 11/12/2018  . History of smoking 11/12/2018   Overview:  quit 2000  Formatting of this note might be different from the original. quit 2000  . HTN (hypertension)   . Hyperlipidemia   . HYPERLIPIDEMIA-MIXED 10/14/2007   Qualifier: Diagnosis of  By: Haroldine Laws, MD, Willette Cluster of this note might be different from the original. Hyperlipidemia   . HYPERTENSION, BENIGN 06/15/2009  . 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 Ischemic cardiomyopathy 08/12/2020  . Medication intolerance 11/12/2018  . Mixed hyperlipidemia 11/12/2018  . Old myocardial infarction 11/12/2018  . Paroxysmal atrial fibrillation ([place]) 07/16/2020  . Pre-operative cardiovascular examination 11/12/2018  . Primary osteoarthritis of both knees 03/05/2017   Considering Zilretta injections. Good improvement with corticosteroid injections however short-lived. Mild improvement with Visco supplementation.  . Renal insufficiency 08/12/2020   pt denies  . Right carotid bruit 08/05/2014  . 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 Type II or unspecified type diabetes mellitus without mention of complication, not stated as uncontrolled 10/14/2007   Formatting of this note might be different from the original. Diabetes Mellitus  . Unilateral primary osteoarthritis, right knee 11/15/2018    Surgeries: Procedure(s): LEFT TOTAL KNEE ARTHROPLASTY on 12/03/2020   Consultants:   Discharged Condition: Improved  Hospital Course: JORDAN PARDINI is an 70 y.o. male who was admitted 12/03/2020 for operative treatment ofArthritis of left knee. Patient has severe unremitting pain that affects sleep, daily activities, and work/hobbies. After pre-op clearance the patient was taken to the operating room on 12/03/2020 and underwent  Procedure(s): LEFT TOTAL KNEE ARTHROPLASTY.    Patient was given perioperative antibiotics:  Anti-infectives (From admission, onward)   Start     Dose/Rate Route Frequency Ordered Stop   12/03/20 1600  ceFAZolin (ANCEF) IVPB 1 g/50 mL premix  1 g 100 mL/hr over 30 Minutes Intravenous Every 6 hours 12/03/20 1421 12/03/20 2123   12/03/20 0645  ceFAZolin (ANCEF) IVPB 2g/100 mL premix        2 g 200 mL/hr over 30 Minutes Intravenous On call to O.R. 12/03/20 0630 12/03/20  1005       Patient was given sequential compression devices, early ambulation, and chemoprophylaxis to prevent DVT.  Patient benefited maximally from hospital stay and there were no complications.    Recent vital signs:  Patient Vitals for the past 24 hrs:  BP Temp Temp src Pulse Resp SpO2 Height Weight  12/04/20 0601 138/83 97.6 F (36.4 C) Oral 94 16 99 % -- --  12/04/20 0130 123/89 98.2 F (36.8 C) Oral 82 17 99 % -- --  12/03/20 2044 127/82 98.5 F (36.9 C) -- 87 17 94 % -- --  12/03/20 1732 123/83 97.8 F (36.6 C) Oral 83 18 100 % -- --  12/03/20 1631 123/89 97.6 F (36.4 C) Oral 71 19 100 % -- --  12/03/20 1531 112/80 98.2 F (36.8 C) -- 77 16 100 % -- --  12/03/20 1419 111/75 (!) 97.5 F (36.4 C) Oral (!) 56 20 100 % 6\' 1"  (1.854 m) 105.2 kg  12/03/20 1345 99/77 (!) 97.5 F (36.4 C) -- 70 14 98 % -- --  12/03/20 1330 99/72 -- -- 69 16 96 % -- --  12/03/20 1315 97/66 -- -- 73 15 100 % -- --  12/03/20 1300 98/69 -- -- 70 10 97 % -- --  12/03/20 1245 106/69 -- -- 70 14 99 % -- --  12/03/20 1230 (!) 91/49 -- -- 70 16 97 % -- --  12/03/20 1217 94/65 97.8 F (36.6 C) -- 75 15 100 % -- --  12/03/20 0945 113/84 -- -- 68 -- 100 % -- --  12/03/20 0915 115/85 -- -- 69 -- 100 % -- --     Recent laboratory studies:  Recent Labs    12/04/20 0325  WBC 10.4  HGB 14.0  HCT 45.8  PLT 125*  NA 137  K 4.1  CL 105  CO2 21*  BUN 27*  CREATININE 1.49*  GLUCOSE 199*  CALCIUM 8.4*     Discharge Medications:   Allergies as of 12/04/2020      Reactions   Allopurinol Other (See Comments)   Felt terrible   Aripiprazole Other (See Comments)   Felt terrible   Bupropion Other (See Comments)   Felt bad   Buspirone Other (See Comments)   Felt bad   Desvenlafaxine Other (See Comments)   Made him hyper   Duloxetine Other (See Comments)   Knocked him out   Escitalopram Other (See Comments)   Felt like crap   Fish Oil    unknown   Fluticasone Furoate Other (See Comments)    Unknown    Hydroxyzine Hcl Other (See Comments)   Knocked him out   Nortriptyline Other (See Comments)   Ineffective   Paroxetine Hcl Other (See Comments)   unknown   Ramipril Cough   Sertraline Other (See Comments)   "felt plugged in"   Venlafaxine Other (See Comments)   Felt bad   Vilazodone Other (See Comments)   "felt weird"      Medication List    TAKE these medications   ALPRAZolam 0.25 MG tablet Commonly known as: XANAX Take 0.25 mg by mouth at bedtime as needed for anxiety.   aspirin 81 MG tablet  Take 81 mg by mouth at bedtime.   chlorthalidone 25 MG tablet Commonly known as: HYGROTON Take 25 mg by mouth daily in the afternoon.   Cialis 20 MG tablet Generic drug: tadalafil Take 20 mg by mouth daily as needed for erectile dysfunction.   cyanocobalamin 1000 MCG/ML injection Commonly known as: (VITAMIN B-12) Inject 1,000 mcg into the muscle every 30 (thirty) days.   fluticasone 50 MCG/ACT nasal spray Commonly known as: FLONASE Place 1 spray into both nostrils daily as needed for allergies or rhinitis.   FreeStyle InsuLinx Test test strip Generic drug: glucose blood   freestyle lancets   furosemide 20 MG tablet Commonly known as: LASIX Take 20 mg by mouth daily as needed for fluid or edema.   glipiZIDE 10 MG 24 hr tablet Commonly known as: GLUCOTROL XL Take 10 mg by mouth daily with breakfast.   HYDROmorphone 2 MG tablet Commonly known as: DILAUDID Take 1 tablet (2 mg total) by mouth every 4 (four) hours as needed for severe pain.   Lantus SoloStar 100 UNIT/ML Solostar Pen Generic drug: insulin glargine Inject 25 Units into the skin at bedtime.   levalbuterol 45 MCG/ACT inhaler Commonly known as: XOPENEX HFA Inhale 1 puff into the lungs every 4 (four) hours as needed for wheezing.   Potassium 99 MG Tabs Take 99 mg by mouth at bedtime.   rivaroxaban 20 MG Tabs tablet Commonly known as: XARELTO Take 1 tablet (20 mg total) by mouth daily  with supper.   rosuvastatin 20 MG tablet Commonly known as: CRESTOR Take 20 mg by mouth at bedtime.   TART CHERRY PO Take 1 tablet by mouth at bedtime.   telmisartan 80 MG tablet Commonly known as: MICARDIS Take 40 mg by mouth at bedtime.   traMADol 50 MG tablet Commonly known as: ULTRAM Take 25 mg by mouth every 4 (four) hours as needed for pain or severe pain.            Durable Medical Equipment  (From admission, onward)         Start     Ordered   12/03/20 1422  DME 3 n 1  Once        12/03/20 1421   12/03/20 1422  DME Walker rolling  Once       Question Answer Comment  Walker: With 5 Inch Wheels   Patient needs a walker to treat with the following condition Status post total left knee replacement      12/03/20 1421          Diagnostic Studies: DG Knee Left Port  Result Date: 12/03/2020 CLINICAL DATA:  Left knee arthroplasty EXAM: PORTABLE LEFT KNEE - 1-2 VIEW COMPARISON:  08/17/2020 FINDINGS: Interval postsurgical changes from left total knee arthroplasty. Arthroplasty components are in their expected alignment without evidence of complication. Expected postoperative changes within the overlying soft tissues. IMPRESSION: Satisfactory postoperative appearance status post left total knee arthroplasty. Electronically Signed   By: Davina Poke D.O.   On: 12/03/2020 14:09    Disposition: Discharge disposition: 01-Home or Self Care          Follow-up Information    Mcarthur Rossetti, MD. Go on 12/16/2020.   Specialty: Orthopedic Surgery Why: at 2:30 pm for your in office post-op appointment with Dr. Clarita Leber information: [place] [address] [phone]        Home, [facility] Follow up.   Specialty: Home Health Services Why: Someone from the home health  agency will be in contact with you after discharge to arrange your first in home physical therapy appointment Contact information: [place] [id number] [phone]                Signed: Mcarthur Rossetti 12/04/2020, 8:58 AM

## 2020-12-04 NOTE — Progress Notes (Signed)
Physical Therapy Treatment Patient Details Name: [name] [name] MRN: [id number] DOB: Feb 08, 1951 Today's Date: 12/04/2020    History of Present Illness Pt s/p L TKR and with hx of R TKr in 2019, DM, MI, A-fib, CAD    PT Comments    Pt continues very cooperative but pain ltd and requiring increased time for all tasks.  Pt up to ambulate increased distance in hall and with noted improvement in ambulatory stability.   Follow Up Recommendations  Home health PT;Follow surgeon's recommendation for DC plan and follow-up therapies     Equipment Recommendations  None recommended by PT    Recommendations for Other Services       Precautions / Restrictions Precautions Precautions: Fall Restrictions Weight Bearing Restrictions: No Other Position/Activity Restrictions: WBAT    Mobility  Bed Mobility                  Transfers Overall transfer level: Needs assistance Equipment used: Rolling walker (2 wheeled) Transfers: Sit to/from Stand Sit to Stand: Min assist         General transfer comment: cues for LE management and use of UEs to self assist  Ambulation/Gait Ambulation/Gait assistance: Min assist;Min guard Gait Distance (Feet): 70 Feet Assistive device: Rolling walker (2 wheeled) Gait Pattern/deviations: Step-to pattern;Decreased step length - right;Decreased step length - left;Trunk flexed;Shuffle Gait velocity: decr   General Gait Details: cues for sequence, posture and position from [employer]             Wheelchair Mobility    Modified Rankin (Stroke Patients Only)       Balance Overall balance assessment: Needs assistance Sitting-balance support: No upper extremity supported;Feet supported Sitting balance-Leahy Scale: Good     Standing balance support: Bilateral upper extremity supported Standing balance-Leahy Scale: Poor                              Cognition Arousal/Alertness: Awake/alert Behavior During Therapy:  WFL for tasks assessed/performed Overall Cognitive Status: Within Functional Limits for tasks assessed                                        Exercises Total Joint Exercises Ankle Circles/Pumps: AROM;Both;15 reps;Supine Quad Sets: AROM;Both;10 reps;Supine Heel Slides: AAROM;Left;10 reps;Supine Straight Leg Raises: AAROM;AROM;Left;15 reps;Supine Goniometric ROM: -10 - 45    General Comments        Pertinent Vitals/Pain Pain Assessment: 0-10 Pain Score: 8  Pain Location: L knee with WB Pain Descriptors / Indicators: Aching;Grimacing;Guarding;Sore Pain Intervention(s): Limited activity within patient's tolerance;Monitored during session;Premedicated before session;Ice applied    Home Living                      Prior Function            PT Goals (current goals can now be found in the care plan section) Acute Rehab PT Goals Patient Stated Goal: Regain IND PT Goal Formulation: With patient Time For Goal Achievement: 12/10/20 Potential to Achieve Goals: Good Progress towards PT goals: Progressing toward goals    Frequency    7X/week      PT Plan Current plan remains appropriate    Co-evaluation              AM-PAC PT "6 Clicks" Mobility   Outcome Measure  Help needed turning  from your back to your side while in a flat bed without using bedrails?: A Little Help needed moving from lying on your back to sitting on the side of a flat bed without using bedrails?: A Little Help needed moving to and from a bed to a chair (including a wheelchair)?: A Little Help needed standing up from a chair using your arms (e.g., wheelchair or bedside chair)?: A Little Help needed to walk in hospital room?: A Little Help needed climbing 3-5 steps with a railing? : A Lot 6 Click Score: 17    End of Session Equipment Utilized During Treatment: Gait belt Activity Tolerance: Patient tolerated treatment well;Patient limited by pain Patient left: in  chair;with call bell/phone within reach;with chair alarm set;with family/visitor present Nurse Communication: Mobility status PT Visit Diagnosis: Difficulty in walking, not elsewhere classified (R26.2);Pain Pain - Right/Left: Left Pain - part of body: Knee     Time: 2482-5003 PT Time Calculation (min) (ACUTE ONLY): 25 min  Charges:  $Gait Training: 23-37 mins $Therapeutic Exercise: 8-22 mins                     Debe Coder PT Acute Rehabilitation Services Pager [phone] Office [phone]    Garnett Rekowski 12/04/2020, 12:45 PM

## 2020-12-04 NOTE — Progress Notes (Signed)
Received referral to assist with [facility] PT. Met with pt. He lives alone. He plans to return home with the support of his stepson and he hired a lady to help him that was recommended by a friend. Referral made to [facility] by the surgeon's office prior to surgery and he agrees with the [facility] agency. Contacted Ronalee Belts with [facility] and confirmed referral.

## 2020-12-04 NOTE — Care Management Obs Status (Signed)
[place] NOTIFICATION   Patient Details  Name: ABSALOM ARO MRN: [id number] Date of Birth: 1951-08-18   Medicare Observation Status Notification Given:  Yes    Norina Buzzard, RN 12/04/2020, 10:51 AM

## 2020-12-06 ENCOUNTER — Encounter (HOSPITAL_COMMUNITY): Payer: Self-pay | Admitting: Orthopaedic Surgery

## 2020-12-07 ENCOUNTER — Other Ambulatory Visit: Payer: Self-pay | Admitting: Orthopaedic Surgery

## 2020-12-07 ENCOUNTER — Telehealth: Payer: Self-pay

## 2020-12-07 MED ORDER — HYDROMORPHONE HCL 2 MG PO TABS
2.0000 mg | ORAL_TABLET | ORAL | 0 refills | Status: DC | PRN
Start: 2020-12-07 — End: 2020-12-16

## 2020-12-07 NOTE — Telephone Encounter (Signed)
Please advise 

## 2020-12-07 NOTE — Telephone Encounter (Signed)
Patient called he is requesting rx refill for hydromorphone. [phone]

## 2020-12-08 ENCOUNTER — Telehealth: Payer: Self-pay | Admitting: *Deleted

## 2020-12-08 DIAGNOSIS — Z7951 Long term (current) use of inhaled steroids: Secondary | ICD-10-CM | POA: Diagnosis not present

## 2020-12-08 DIAGNOSIS — F32A Depression, unspecified: Secondary | ICD-10-CM | POA: Diagnosis not present

## 2020-12-08 DIAGNOSIS — Z96653 Presence of artificial knee joint, bilateral: Secondary | ICD-10-CM | POA: Diagnosis not present

## 2020-12-08 DIAGNOSIS — Z471 Aftercare following joint replacement surgery: Secondary | ICD-10-CM | POA: Diagnosis not present

## 2020-12-08 DIAGNOSIS — E782 Mixed hyperlipidemia: Secondary | ICD-10-CM | POA: Diagnosis not present

## 2020-12-08 DIAGNOSIS — I480 Paroxysmal atrial fibrillation: Secondary | ICD-10-CM | POA: Diagnosis not present

## 2020-12-08 DIAGNOSIS — I10 Essential (primary) hypertension: Secondary | ICD-10-CM | POA: Diagnosis not present

## 2020-12-08 DIAGNOSIS — I255 Ischemic cardiomyopathy: Secondary | ICD-10-CM | POA: Diagnosis not present

## 2020-12-08 DIAGNOSIS — Z9181 History of falling: Secondary | ICD-10-CM | POA: Diagnosis not present

## 2020-12-08 DIAGNOSIS — Z87891 Personal history of nicotine dependence: Secondary | ICD-10-CM | POA: Diagnosis not present

## 2020-12-08 DIAGNOSIS — Z7982 Long term (current) use of aspirin: Secondary | ICD-10-CM | POA: Diagnosis not present

## 2020-12-08 DIAGNOSIS — E119 Type 2 diabetes mellitus without complications: Secondary | ICD-10-CM | POA: Diagnosis not present

## 2020-12-08 DIAGNOSIS — Z7901 Long term (current) use of anticoagulants: Secondary | ICD-10-CM | POA: Diagnosis not present

## 2020-12-08 DIAGNOSIS — Z7984 Long term (current) use of oral hypoglycemic drugs: Secondary | ICD-10-CM | POA: Diagnosis not present

## 2020-12-08 DIAGNOSIS — I2510 Atherosclerotic heart disease of native coronary artery without angina pectoris: Secondary | ICD-10-CM | POA: Diagnosis not present

## 2020-12-08 DIAGNOSIS — I252 Old myocardial infarction: Secondary | ICD-10-CM | POA: Diagnosis not present

## 2020-12-08 DIAGNOSIS — M109 Gout, unspecified: Secondary | ICD-10-CM | POA: Diagnosis not present

## 2020-12-08 DIAGNOSIS — Z794 Long term (current) use of insulin: Secondary | ICD-10-CM | POA: Diagnosis not present

## 2020-12-08 NOTE — Telephone Encounter (Signed)
Ortho bundle D/C call completed. 

## 2020-12-10 ENCOUNTER — Other Ambulatory Visit: Payer: Self-pay | Admitting: *Deleted

## 2020-12-10 ENCOUNTER — Telehealth: Payer: Self-pay | Admitting: *Deleted

## 2020-12-10 DIAGNOSIS — E782 Mixed hyperlipidemia: Secondary | ICD-10-CM | POA: Diagnosis not present

## 2020-12-10 DIAGNOSIS — Z7901 Long term (current) use of anticoagulants: Secondary | ICD-10-CM | POA: Diagnosis not present

## 2020-12-10 DIAGNOSIS — Z794 Long term (current) use of insulin: Secondary | ICD-10-CM | POA: Diagnosis not present

## 2020-12-10 DIAGNOSIS — F32A Depression, unspecified: Secondary | ICD-10-CM | POA: Diagnosis not present

## 2020-12-10 DIAGNOSIS — M109 Gout, unspecified: Secondary | ICD-10-CM | POA: Diagnosis not present

## 2020-12-10 DIAGNOSIS — Z96652 Presence of left artificial knee joint: Secondary | ICD-10-CM

## 2020-12-10 DIAGNOSIS — I10 Essential (primary) hypertension: Secondary | ICD-10-CM | POA: Diagnosis not present

## 2020-12-10 DIAGNOSIS — I252 Old myocardial infarction: Secondary | ICD-10-CM | POA: Diagnosis not present

## 2020-12-10 DIAGNOSIS — I480 Paroxysmal atrial fibrillation: Secondary | ICD-10-CM | POA: Diagnosis not present

## 2020-12-10 DIAGNOSIS — Z96653 Presence of artificial knee joint, bilateral: Secondary | ICD-10-CM | POA: Diagnosis not present

## 2020-12-10 DIAGNOSIS — I255 Ischemic cardiomyopathy: Secondary | ICD-10-CM | POA: Diagnosis not present

## 2020-12-10 DIAGNOSIS — Z471 Aftercare following joint replacement surgery: Secondary | ICD-10-CM | POA: Diagnosis not present

## 2020-12-10 DIAGNOSIS — E119 Type 2 diabetes mellitus without complications: Secondary | ICD-10-CM | POA: Diagnosis not present

## 2020-12-10 DIAGNOSIS — Z7984 Long term (current) use of oral hypoglycemic drugs: Secondary | ICD-10-CM | POA: Diagnosis not present

## 2020-12-10 DIAGNOSIS — Z7982 Long term (current) use of aspirin: Secondary | ICD-10-CM | POA: Diagnosis not present

## 2020-12-10 DIAGNOSIS — Z9181 History of falling: Secondary | ICD-10-CM | POA: Diagnosis not present

## 2020-12-10 DIAGNOSIS — I2510 Atherosclerotic heart disease of native coronary artery without angina pectoris: Secondary | ICD-10-CM | POA: Diagnosis not present

## 2020-12-10 DIAGNOSIS — Z87891 Personal history of nicotine dependence: Secondary | ICD-10-CM | POA: Diagnosis not present

## 2020-12-10 DIAGNOSIS — Z7951 Long term (current) use of inhaled steroids: Secondary | ICD-10-CM | POA: Diagnosis not present

## 2020-12-10 NOTE — Telephone Encounter (Signed)
RNCM reached out to patient and updated on no new recommendations. Updated to wear compression hose and elevate as well due to help with bruising.

## 2020-12-10 NOTE — Telephone Encounter (Signed)
No other recs. Likely just from the hematoma in his knee combined with the icing which will caused redness.

## 2020-12-10 NOTE — Telephone Encounter (Signed)
7 day Ortho bundle call. Patient states he is having increased redness around knee (outside of dressing) and down the leg, increased warmth as well. Has not removed dressing. No pain in calf, no flu-like symptoms and no fever to date. Therapy going today and I've asked them to assess and get back with me/take a pic and send as well. Any other recommendations currently? I will update you once I hear back from them as well. Thanks.

## 2020-12-13 DIAGNOSIS — I255 Ischemic cardiomyopathy: Secondary | ICD-10-CM | POA: Diagnosis not present

## 2020-12-13 DIAGNOSIS — Z794 Long term (current) use of insulin: Secondary | ICD-10-CM | POA: Diagnosis not present

## 2020-12-13 DIAGNOSIS — Z9181 History of falling: Secondary | ICD-10-CM | POA: Diagnosis not present

## 2020-12-13 DIAGNOSIS — Z96653 Presence of artificial knee joint, bilateral: Secondary | ICD-10-CM | POA: Diagnosis not present

## 2020-12-13 DIAGNOSIS — E119 Type 2 diabetes mellitus without complications: Secondary | ICD-10-CM | POA: Diagnosis not present

## 2020-12-13 DIAGNOSIS — I10 Essential (primary) hypertension: Secondary | ICD-10-CM | POA: Diagnosis not present

## 2020-12-13 DIAGNOSIS — M109 Gout, unspecified: Secondary | ICD-10-CM | POA: Diagnosis not present

## 2020-12-13 DIAGNOSIS — F32A Depression, unspecified: Secondary | ICD-10-CM | POA: Diagnosis not present

## 2020-12-13 DIAGNOSIS — Z7951 Long term (current) use of inhaled steroids: Secondary | ICD-10-CM | POA: Diagnosis not present

## 2020-12-13 DIAGNOSIS — E782 Mixed hyperlipidemia: Secondary | ICD-10-CM | POA: Diagnosis not present

## 2020-12-13 DIAGNOSIS — I480 Paroxysmal atrial fibrillation: Secondary | ICD-10-CM | POA: Diagnosis not present

## 2020-12-13 DIAGNOSIS — I252 Old myocardial infarction: Secondary | ICD-10-CM | POA: Diagnosis not present

## 2020-12-13 DIAGNOSIS — Z7984 Long term (current) use of oral hypoglycemic drugs: Secondary | ICD-10-CM | POA: Diagnosis not present

## 2020-12-13 DIAGNOSIS — Z7901 Long term (current) use of anticoagulants: Secondary | ICD-10-CM | POA: Diagnosis not present

## 2020-12-13 DIAGNOSIS — Z471 Aftercare following joint replacement surgery: Secondary | ICD-10-CM | POA: Diagnosis not present

## 2020-12-13 DIAGNOSIS — Z87891 Personal history of nicotine dependence: Secondary | ICD-10-CM | POA: Diagnosis not present

## 2020-12-13 DIAGNOSIS — Z7982 Long term (current) use of aspirin: Secondary | ICD-10-CM | POA: Diagnosis not present

## 2020-12-13 DIAGNOSIS — I2510 Atherosclerotic heart disease of native coronary artery without angina pectoris: Secondary | ICD-10-CM | POA: Diagnosis not present

## 2020-12-15 DIAGNOSIS — Z87891 Personal history of nicotine dependence: Secondary | ICD-10-CM | POA: Diagnosis not present

## 2020-12-15 DIAGNOSIS — I480 Paroxysmal atrial fibrillation: Secondary | ICD-10-CM | POA: Diagnosis not present

## 2020-12-15 DIAGNOSIS — E119 Type 2 diabetes mellitus without complications: Secondary | ICD-10-CM | POA: Diagnosis not present

## 2020-12-15 DIAGNOSIS — Z471 Aftercare following joint replacement surgery: Secondary | ICD-10-CM | POA: Diagnosis not present

## 2020-12-15 DIAGNOSIS — I10 Essential (primary) hypertension: Secondary | ICD-10-CM | POA: Diagnosis not present

## 2020-12-15 DIAGNOSIS — Z7984 Long term (current) use of oral hypoglycemic drugs: Secondary | ICD-10-CM | POA: Diagnosis not present

## 2020-12-15 DIAGNOSIS — E782 Mixed hyperlipidemia: Secondary | ICD-10-CM | POA: Diagnosis not present

## 2020-12-15 DIAGNOSIS — I255 Ischemic cardiomyopathy: Secondary | ICD-10-CM | POA: Diagnosis not present

## 2020-12-15 DIAGNOSIS — F32A Depression, unspecified: Secondary | ICD-10-CM | POA: Diagnosis not present

## 2020-12-15 DIAGNOSIS — Z96653 Presence of artificial knee joint, bilateral: Secondary | ICD-10-CM | POA: Diagnosis not present

## 2020-12-15 DIAGNOSIS — Z9181 History of falling: Secondary | ICD-10-CM | POA: Diagnosis not present

## 2020-12-15 DIAGNOSIS — Z794 Long term (current) use of insulin: Secondary | ICD-10-CM | POA: Diagnosis not present

## 2020-12-15 DIAGNOSIS — M109 Gout, unspecified: Secondary | ICD-10-CM | POA: Diagnosis not present

## 2020-12-15 DIAGNOSIS — I252 Old myocardial infarction: Secondary | ICD-10-CM | POA: Diagnosis not present

## 2020-12-15 DIAGNOSIS — Z7901 Long term (current) use of anticoagulants: Secondary | ICD-10-CM | POA: Diagnosis not present

## 2020-12-15 DIAGNOSIS — I2510 Atherosclerotic heart disease of native coronary artery without angina pectoris: Secondary | ICD-10-CM | POA: Diagnosis not present

## 2020-12-15 DIAGNOSIS — Z7951 Long term (current) use of inhaled steroids: Secondary | ICD-10-CM | POA: Diagnosis not present

## 2020-12-15 DIAGNOSIS — Z7982 Long term (current) use of aspirin: Secondary | ICD-10-CM | POA: Diagnosis not present

## 2020-12-16 ENCOUNTER — Telehealth: Payer: Self-pay | Admitting: *Deleted

## 2020-12-16 ENCOUNTER — Encounter: Payer: Self-pay | Admitting: Orthopaedic Surgery

## 2020-12-16 ENCOUNTER — Ambulatory Visit (INDEPENDENT_AMBULATORY_CARE_PROVIDER_SITE_OTHER): Payer: Medicare Other | Admitting: Orthopaedic Surgery

## 2020-12-16 DIAGNOSIS — Z96652 Presence of left artificial knee joint: Secondary | ICD-10-CM

## 2020-12-16 MED ORDER — DOXYCYCLINE HYCLATE 100 MG PO TABS: 100.0000 mg | ORAL_TABLET | Freq: Two times a day (BID) | ORAL | 0 refills | Status: DC

## 2020-12-16 MED ORDER — HYDROMORPHONE HCL 2 MG PO TABS: 2.0000 mg | ORAL_TABLET | ORAL | 0 refills | Status: DC | PRN

## 2020-12-16 MED ORDER — GABAPENTIN 100 MG PO CAPS: 100.0000 mg | ORAL_CAPSULE | Freq: Three times a day (TID) | ORAL | 1 refills | Status: DC

## 2020-12-16 MED ORDER — CYCLOBENZAPRINE HCL 10 MG PO TABS: 10.0000 mg | ORAL_TABLET | Freq: Three times a day (TID) | ORAL | 1 refills | Status: DC | PRN

## 2020-12-16 NOTE — Progress Notes (Signed)
The patient is 2 weeks status post a left total knee arthroplasty.  He is 70 years old.  His son is working.  He has abundant left knee swelling.  He is on Xarelto chronically.  The staples were removed and Steri-Strips applied.  He does has pitting edema in his leg and is wearing compressive garments.  The knee redness is more likely hematoma that it is infection.  He does not have any fever and chills.  His calf is soft.  He will start outpatient physical therapy next week.  I will prophylactically put him on doxycycline but also can add to his pain regimen of Neurontin 100 mg 3 times a day and switch out from methocarbamol to Flexeril given the severity of his pain.  Also refill his Dilaudid.  We will see him back in 2 weeks for repeat exam but no x-rays are needed.  All question concerns were answered and addressed.

## 2020-12-16 NOTE — Telephone Encounter (Signed)
Ortho bundle 14 day call completed. 

## 2020-12-17 DIAGNOSIS — Z794 Long term (current) use of insulin: Secondary | ICD-10-CM | POA: Diagnosis not present

## 2020-12-17 DIAGNOSIS — I10 Essential (primary) hypertension: Secondary | ICD-10-CM | POA: Diagnosis not present

## 2020-12-17 DIAGNOSIS — F32A Depression, unspecified: Secondary | ICD-10-CM | POA: Diagnosis not present

## 2020-12-17 DIAGNOSIS — I255 Ischemic cardiomyopathy: Secondary | ICD-10-CM | POA: Diagnosis not present

## 2020-12-17 DIAGNOSIS — Z7982 Long term (current) use of aspirin: Secondary | ICD-10-CM | POA: Diagnosis not present

## 2020-12-17 DIAGNOSIS — Z7951 Long term (current) use of inhaled steroids: Secondary | ICD-10-CM | POA: Diagnosis not present

## 2020-12-17 DIAGNOSIS — Z9181 History of falling: Secondary | ICD-10-CM | POA: Diagnosis not present

## 2020-12-17 DIAGNOSIS — Z96653 Presence of artificial knee joint, bilateral: Secondary | ICD-10-CM | POA: Diagnosis not present

## 2020-12-17 DIAGNOSIS — E119 Type 2 diabetes mellitus without complications: Secondary | ICD-10-CM | POA: Diagnosis not present

## 2020-12-17 DIAGNOSIS — E782 Mixed hyperlipidemia: Secondary | ICD-10-CM | POA: Diagnosis not present

## 2020-12-17 DIAGNOSIS — I480 Paroxysmal atrial fibrillation: Secondary | ICD-10-CM | POA: Diagnosis not present

## 2020-12-17 DIAGNOSIS — Z471 Aftercare following joint replacement surgery: Secondary | ICD-10-CM | POA: Diagnosis not present

## 2020-12-17 DIAGNOSIS — Z7984 Long term (current) use of oral hypoglycemic drugs: Secondary | ICD-10-CM | POA: Diagnosis not present

## 2020-12-17 DIAGNOSIS — I2510 Atherosclerotic heart disease of native coronary artery without angina pectoris: Secondary | ICD-10-CM | POA: Diagnosis not present

## 2020-12-17 DIAGNOSIS — Z7901 Long term (current) use of anticoagulants: Secondary | ICD-10-CM | POA: Diagnosis not present

## 2020-12-17 DIAGNOSIS — Z87891 Personal history of nicotine dependence: Secondary | ICD-10-CM | POA: Diagnosis not present

## 2020-12-17 DIAGNOSIS — M109 Gout, unspecified: Secondary | ICD-10-CM | POA: Diagnosis not present

## 2020-12-17 DIAGNOSIS — I252 Old myocardial infarction: Secondary | ICD-10-CM | POA: Diagnosis not present

## 2020-12-21 ENCOUNTER — Ambulatory Visit: Payer: Medicare Other

## 2020-12-28 ENCOUNTER — Ambulatory Visit: Payer: Medicare Other | Attending: Orthopaedic Surgery

## 2020-12-28 ENCOUNTER — Other Ambulatory Visit: Payer: Self-pay

## 2020-12-28 DIAGNOSIS — M6281 Muscle weakness (generalized): Secondary | ICD-10-CM | POA: Insufficient documentation

## 2020-12-28 DIAGNOSIS — R600 Localized edema: Secondary | ICD-10-CM | POA: Insufficient documentation

## 2020-12-28 DIAGNOSIS — R2689 Other abnormalities of gait and mobility: Secondary | ICD-10-CM | POA: Diagnosis not present

## 2020-12-28 DIAGNOSIS — R262 Difficulty in walking, not elsewhere classified: Secondary | ICD-10-CM | POA: Insufficient documentation

## 2020-12-28 DIAGNOSIS — M25661 Stiffness of right knee, not elsewhere classified: Secondary | ICD-10-CM | POA: Insufficient documentation

## 2020-12-28 DIAGNOSIS — M25662 Stiffness of left knee, not elsewhere classified: Secondary | ICD-10-CM

## 2020-12-28 DIAGNOSIS — M25561 Pain in right knee: Secondary | ICD-10-CM | POA: Diagnosis not present

## 2020-12-28 DIAGNOSIS — M25562 Pain in left knee: Secondary | ICD-10-CM | POA: Diagnosis not present

## 2020-12-28 NOTE — Therapy (Signed)
[facility] [address]  [place], [place], [address] Phone: [phone]   Fax:  [phone]  Physical Therapy Evaluation  Patient Details  Name: [name] [name] MRN: [id number] Date of Birth: [birth year] Referring Provider (PT): Ninfa Linden   Encounter Date: 12/28/2020   PT End of Session - 12/28/20 1811    Visit Number 1    Number of Visits 17    Date for PT Re-Evaluation 02/23/21    PT Start Time 9357    PT Stop Time 1710   10 min vaso   PT Time Calculation (min) 55 min    Equipment Utilized During Treatment Gait belt    Activity Tolerance Patient tolerated treatment well;Patient limited by pain;Patient limited by fatigue    Behavior During Therapy [facility] for tasks assessed/performed           Past Medical History:  Diagnosis Date  . Bilateral leg edema 05/04/2017  . 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 CAD (coronary artery disease), native coronary artery 11/12/2018   Formatting of this note might be different from the original. S/p MI - Dr. Maurene Capes  . CAD, NATIVE VESSEL 06/15/2009   Qualifier: Diagnosis of  By: Haroldine Laws, MD, Eileen Stanford CAP (community acquired pneumonia) 08/17/2016  . Depressive disorder 11/12/2018  . Diabetes mellitus due to underlying condition with unspecified complications ([place]) 0/17/7939  . DM (diabetes mellitus) ([place])   . 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 Erythrocytosis 06/01/2020  . Essential hypertension 10/14/2007   Formatting of this note might be different from the original. Hypertension  10/1 IMO update  . Gout, unspecified 10/14/2007   Overview:  Gout  10/1 IMO update  Formatting of this note might be different from the original. Gout  10/1 IMO update  . History of colonic polyps 11/12/2018   Overview:  rpt colonoscopy 5/17; Dr. Haig Prophet of this  note might be different from the original. rpt colonoscopy 5/17; Dr. Erlene Quan  . History of kidney stones   . History of nephrolithiasis 11/12/2018  . History of smoking 11/12/2018   Overview:  quit 2000  Formatting of this note might be different from the original. quit 2000  . HTN (hypertension)   . Hyperlipidemia   . HYPERLIPIDEMIA-MIXED 10/14/2007   Qualifier: Diagnosis of  By: Haroldine Laws, MD, Willette Cluster of this note might be different from the original. Hyperlipidemia  . HYPERTENSION, BENIGN 06/15/2009  . 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 Ischemic cardiomyopathy 08/12/2020  . Medication intolerance 11/12/2018  . Mixed hyperlipidemia 11/12/2018  . Old myocardial infarction 11/12/2018  . Paroxysmal atrial fibrillation ([place]) 07/16/2020  . Pre-operative cardiovascular examination 11/12/2018  . Primary osteoarthritis of both knees 03/05/2017   Considering Zilretta injections. Good improvement with corticosteroid injections however short-lived. Mild improvement with Visco supplementation.  . Renal insufficiency 08/12/2020   pt denies  . Right carotid bruit 08/05/2014  . 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 Type II or unspecified type diabetes mellitus without mention of complication, not stated as uncontrolled 10/14/2007   Formatting of this note might be different from the original. Diabetes Mellitus  . Unilateral primary osteoarthritis, right knee 11/15/2018    Past Surgical History:  Procedure Laterality Date  .  BILATERAL KNEE ARTHROSCOPY    . CORONARY ANGIOPLASTY WITH STENT PLACEMENT    . LAPAROSCOPIC INGUINAL HERNIA REPAIR    . NOSE SURGERY    . TOTAL KNEE ARTHROPLASTY Right 11/15/2018   Procedure: RIGHT TOTAL KNEE ARTHROPLASTY;  Surgeon: Mcarthur Rossetti, MD;  Location: [facility];  Service: Orthopedics;  Laterality: Right;  . TOTAL KNEE ARTHROPLASTY Left 12/03/2020    Procedure: LEFT TOTAL KNEE ARTHROPLASTY;  Surgeon: Mcarthur Rossetti, MD;  Location: [facility];  Service: Orthopedics;  Laterality: Left;    There were no vitals filed for this visit.    Subjective Assessment - 12/28/20 1618    Subjective Pt brought in by son (Will), Pt seated in transport chair (personal walker left in car).  Had L TKA 12/03/20. Had in home PT which ended about 2 weeks ago. Pt has been limited by alot of pain despite pain medications. Son reports pt has had a diffiuclty past 2 years between knee surgeries and having so much pain and such a big decrease in is mobility, feeling unsteady on his feet.    Patient is accompained by: Family member   Will, Son   Pertinent History HTN, arthritis, T2DM, history of heart attack with stents placed in 2009    Limitations Sitting;Lifting;Standing;Walking    How long can you sit comfortably? <10 min    How long can you stand comfortably? <5 min    How long can you walk comfortably? short household distances, increased pain with functional mobility in the house. most limited by pain.    Patient Stated Goals to decrease pain, improve strength, improve stability.    Currently in Pain? Yes    Pain Score 9     Pain Location Knee    Pain Orientation Left    Pain Descriptors / Indicators Aching;Tightness   Swelling, pain at lateral left thigh, Shooting nerve pain down anterior medial shin.   Aggravating Factors  bearing alot of weight on it    Pain Relieving Factors dilaudid, flexiril, tramadol              [facility] PT Assessment - 12/28/20 1625      Assessment   Medical Diagnosis L TKA 12/03/20    Referring Provider (PT) Ninfa Linden    Hand Dominance Right    Next MD Visit 12/30/2020      Precautions   Precaution Comments L TKA      Balance Screen   Has the patient fallen in the past 6 months Yes    How many times? 1    Has the patient had a decrease in activity level because of a fear of falling?  Yes    Is the patient reluctant to  leave their home because of a fear of falling?  Yes      [place] residence    Living Arrangements Alone    Available Help at Discharge Family    Type of [place] to enter    Entrance Stairs-Number of Steps 3    Entrance Stairs-Rails Left    Home Layout Two level;Bed/bath upstairs   1/2 bath downstairs. Currently living out of 1st floor   Alternate Level Stairs-Number of Steps 13    Alternate Level Stairs-Rails Can reach both    [place] - 2 wheels;Cane - quad;Bedside commode;Shower seat      Prior Function   Level of Independence Independent with basic ADLs;Independent with household  mobility with device   over past 2 years has had decrease in ativity between knee surgeries.   Vocation Retired    Leisure "anything and everything", going to baseball games, car shows, things that he walks alot for.      Cognition   Overall Cognitive Status Within Functional Limits for tasks assessed      Observation/Other Assessments   Skin Integrity Surgical wound L knee intact, general skin redness noted around scar. Knee edema. Steristrips along wound.    Focus on Therapeutic Outcomes (FOTO)  To be assessed      Observation/Other Assessments-Edema    Edema Circumferential   48 LEFT joint line, 43 cm RIGHT joint line.     ROM / Strength   AROM / PROM / Strength AROM;PROM;Strength      AROM   AROM Assessment Site Knee    Right/Left Knee Right;Left    Right Knee Extension 0    Right Knee Flexion 115    Left Knee Extension -25   limited by pain, tightness, weakness   Left Knee Flexion 73   limited by pain, tightness, weakness     PROM   Overall PROM Comments --    PROM Assessment Site Knee    Right/Left Knee Left;Right    Left Knee Extension -20 degrees, limited by pain and tightness    Left Knee Flexion 70 deg, limited by pain and tightness      Strength   Strength Assessment Site Hip;Knee;Ankle    Right/Left  Hip Right;Left    Right Hip Flexion 4/5    Left Hip Flexion 3-/5    Right/Left Knee Right;Left    Right Knee Flexion 4+/5    Right Knee Extension 4+/5    Left Knee Flexion 2+/5    Left Knee Extension 2+/5    Right/Left Ankle Right;Left    Right Ankle Dorsiflexion 4+/5    Left Ankle Dorsiflexion 3-/5      Flexibility   Soft Tissue Assessment /Muscle Length yes    Hamstrings mod/severe tight B      Transfers   Transfers Sit to Stand;Stand to Sit;Stand Pivot Transfers    Sit to Stand 5: Supervision;With upper extremity assist;4: Min guard   to walker   Sit to Stand Details (indicate cue type and reason) Kicks left foot out and weight shifts right due to pain    Stand to Sit 5: Supervision;With upper extremity assist;4: Min guard   from walker   Stand Pivot Transfers 5: Supervision;4: Min guard   with FWW     Ambulation/Gait   Ambulation/Gait Yes    Ambulation/Gait Assistance 6: Modified independent (Device/Increase time);5: Supervision;4: Min guard    Ambulation/Gait Assistance Details Using FWW, gait belt.    Ambulation Distance (Feet) 75 Feet    Gait Pattern Decreased arm swing - left;Decreased arm swing - right;Step-through pattern;Decreased step length - left;Decreased stance time - left;Decreased stride length;Decreased weight shift to left;Poor foot clearance - left    Ambulation Surface Level;Indoor                      Objective measurements completed on examination: See above findings.       [facility] Adult PT Treatment/Exercise - 12/28/20 1625      Modalities   Modalities Vasopneumatic      Vasopneumatic   Number Minutes Vasopneumatic  10 minutes    Vasopnuematic Location  Knee   LEFT   Vasopneumatic Pressure Medium    Vasopneumatic  Temperature  34                  PT Education - 12/28/20 1811    Education Details POC HEP            PT Short Term Goals - 12/28/20 1812      PT SHORT TERM GOAL #1   Title Independent with initial HEP     Time 2    Period Weeks    Status New    Target Date 01/12/21             PT Long Term Goals - 12/28/20 1812      PT LONG TERM GOAL #1   Title Independent with ongoing HEP    Time 8    Period Weeks    Status New    Target Date 02/23/21      PT LONG TERM GOAL #2   Title R knee AROM >/= 3-115 dg to allow for normal gait and stair pattern    Time 8    Period Weeks    Status New    Target Date 02/23/21      PT LONG TERM GOAL #3   Title B LE strength >/= 4+/5 for improved stability    Time 8    Period Weeks    Status New    Target Date 02/23/21      PT LONG TERM GOAL #4   Title Patient will ambulate with normal gait pattern w/o AD to increase ease of community access    Time 8    Period Weeks    Status New    Target Date 02/23/21      PT LONG TERM GOAL #5   Title Patient will ascend/descend 1 flight of stairs with good reciprocal pattern    Time 8    Period Weeks    Status New    Target Date 02/23/21                  Plan - 12/28/20 1811    Clinical Impression Statement Pt is a 70 yo male 3.5 weeks s/p L TKA. Pt is currrently extremely limited by pain. He is using his arms to assist getting his LLE off of chair or when attempting to get on/off table or bed. He presents with L knee edema, decreased strength, decreased balance, decreased weight bearing on the left, and abnormal gait mechanics. He was able to walk with FWW 75 ft with cues for heel strike as tolerated and seemed very motivated to get walking, but with increased pain with getting into sitting and with tranfsers. L Knee ROM currently limited 20 to 70 degrees PROM with alot of pain and tightness. Patient will benefit from skilled physical therapy to work towards decreased pain and improving ROM, strength, functional mobility, and gait.    Personal Factors and Comorbidities Comorbidity 3+;Fitness;Past/Current Experience;Time since onset of injury/illness/exacerbation    Comorbidities HTN, arthritis,  T2DM, history of heart attack with stents placed in 2009, previously had R TKA    Examination-Activity Limitations Bed Mobility;Bend;Carry;Dressing;Lift;Toileting;Stand;Stairs;Squat;Sit;Locomotion Level;Transfers    Examination-Participation Restrictions Community Activity;Interpersonal Relationship;Driving;Laundry    Stability/Clinical Decision Making Evolving/Moderate complexity    Clinical Decision Making Moderate    Rehab Potential Good    PT Frequency 2x / week    PT Duration 8 weeks    PT Treatment/Interventions ADLs/Self Care Home Management;Vasopneumatic Device;Cryotherapy;Electrical Stimulation;Iontophoresis 4mg /ml Dexamethasone;Neuromuscular re-education;Balance training;Therapeutic exercise;Therapeutic activities;Functional mobility training;Stair training;Gait training;Patient/family education;Manual techniques;Joint Manipulations;Taping;Scar mobilization  PT Next Visit Plan Knee FOTO, Tolerance and carryover of HEP, follow up regarding MD visit (12/30/20 per pt report). Progress TE, gait training  as tolerated. Manual and modalities as needed for mobility and pain mgmt    PT Home Exercise Plan Access Code: [email or web address] . Supine Heel Slide with Strap , Seated Heel Slide , Supine Quadricep Sets, Seated Hamstring Stretch    Consulted and Agree with Plan of Care Patient           Patient will benefit from skilled therapeutic intervention in order to improve the following deficits and impairments:  Abnormal gait,Decreased range of motion,Difficulty walking,Increased fascial restricitons,Increased muscle spasms,Decreased endurance,Decreased activity tolerance,Pain,Improper body mechanics,Impaired flexibility,Decreased balance,Decreased mobility,Decreased strength,Increased edema,Postural dysfunction  Visit Diagnosis: Acute pain of left knee - Plan: PT plan of care cert/re-cert  Other abnormalities of gait and mobility - Plan: PT plan of care cert/re-cert  Difficulty in walking, not  elsewhere classified - Plan: PT plan of care cert/re-cert  Localized edema - Plan: PT plan of care cert/re-cert  Muscle weakness (generalized) - Plan: PT plan of care cert/re-cert  Stiffness of left knee, not elsewhere classified - Plan: PT plan of care cert/re-cert     Problem List Patient Active Problem List   Diagnosis Date Noted  . 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 Arthritis of left knee 12/02/2020  . Renal insufficiency 08/12/2020  . Ischemic cardiomyopathy 08/12/2020  . CAD (coronary artery disease)   . DM (diabetes mellitus) ([place])   . HTN (hypertension)   . Hyperlipidemia   . Diabetes mellitus due to underlying condition with unspecified complications ([place]) 72/62/0355  . Paroxysmal atrial fibrillation ([place]) 07/16/2020  . Erythrocytosis 06/01/2020  . Unilateral primary osteoarthritis, right knee 11/15/2018  . 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 Depressive disorder 11/12/2018  . 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 History of colonic polyps 11/12/2018  . History of nephrolithiasis 11/12/2018  . History of smoking 11/12/2018  . 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 Medication intolerance 11/12/2018  . Old myocardial infarction 11/12/2018  . Mixed hyperlipidemia 11/12/2018  . Pre-operative cardiovascular examination 11/12/2018  . CAD (coronary artery disease), native coronary artery 11/12/2018  . Bilateral leg edema 05/04/2017  . Primary osteoarthritis of both knees 03/05/2017  . CAP (community acquired pneumonia) 08/17/2016  . Right carotid bruit 08/05/2014  . HYPERTENSION, BENIGN 06/15/2009  . CAD, NATIVE VESSEL 06/15/2009  . Type II or unspecified type diabetes mellitus without mention of complication, not stated as uncontrolled 10/14/2007  . Gout, unspecified 10/14/2007  . Essential hypertension 10/14/2007    Hall Busing,  PT, DPT 12/29/2020, 8:38 AM  [facility] [address]  [place], [place], [address] Phone: [phone]   Fax:  [phone]  Name: GARION WEMPE MRN: [id number] Date of Birth: 11/03/1951

## 2020-12-30 ENCOUNTER — Encounter: Payer: Self-pay | Admitting: Physician Assistant

## 2020-12-30 ENCOUNTER — Ambulatory Visit (INDEPENDENT_AMBULATORY_CARE_PROVIDER_SITE_OTHER): Payer: Medicare Other | Admitting: Physician Assistant

## 2020-12-30 DIAGNOSIS — Z96652 Presence of left artificial knee joint: Secondary | ICD-10-CM

## 2020-12-30 MED ORDER — DOXYCYCLINE HYCLATE 100 MG PO TABS: 100.0000 mg | ORAL_TABLET | Freq: Two times a day (BID) | ORAL | 0 refills | Status: DC

## 2020-12-30 NOTE — Progress Notes (Signed)
HPI: Mr. Aloisi returns today status post left total knee arthroplasty 12/03/2020.  He states that his left knee is still painful.  He is making progress with physical therapy.  He has been on doxycycline without any adverse effects.  Denies any fevers chills.  Physical exam: Left knee surgical incisions healing well there is no erythema.  Does have redness knee that is present in the lower leg.  There is no abnormal warmth.  Redness does dissipate with elevation consistent with a dependent rubor.  Calf soft.  Pitting edema of the left lower leg.  Full extension left knee flexion to approximately 90 degrees.  Impression: Status post left total knee arthroplasty 12/03/2020  Plan: We will continue his doxycycline for another week.  He is to wash the leg with an antibacterial soap daily.  Elevation wiggling toes encouraged.  Continue physical therapy for range of motion strengthening.  He is fitted with the sock from Dr. Sharol Given he will wear this just during the day.  He is shown how to don the sock and care of the sock is discussed with the patient.  Questions were encouraged and answered at length.  We will see him back in 2 weeks sooner if there is any concerns or if he has any change in the redness in the leg , warmth or drainage.

## 2021-01-03 ENCOUNTER — Other Ambulatory Visit: Payer: Self-pay

## 2021-01-03 DIAGNOSIS — Z87442 Personal history of urinary calculi: Secondary | ICD-10-CM | POA: Insufficient documentation

## 2021-01-04 ENCOUNTER — Other Ambulatory Visit: Payer: Self-pay

## 2021-01-04 ENCOUNTER — Encounter: Payer: Self-pay | Admitting: Cardiology

## 2021-01-04 ENCOUNTER — Ambulatory Visit: Payer: Medicare Other | Admitting: Physical Therapy

## 2021-01-04 ENCOUNTER — Ambulatory Visit (INDEPENDENT_AMBULATORY_CARE_PROVIDER_SITE_OTHER): Payer: Medicare Other | Admitting: Cardiology

## 2021-01-04 ENCOUNTER — Encounter: Payer: Self-pay | Admitting: Physical Therapy

## 2021-01-04 VITALS — BP 122/72 | HR 82 | Ht 73.0 in | Wt 212.1 lb

## 2021-01-04 DIAGNOSIS — I480 Paroxysmal atrial fibrillation: Secondary | ICD-10-CM | POA: Diagnosis not present

## 2021-01-04 DIAGNOSIS — N289 Disorder of kidney and ureter, unspecified: Secondary | ICD-10-CM | POA: Diagnosis not present

## 2021-01-04 DIAGNOSIS — M25562 Pain in left knee: Secondary | ICD-10-CM

## 2021-01-04 DIAGNOSIS — R600 Localized edema: Secondary | ICD-10-CM

## 2021-01-04 DIAGNOSIS — I10 Essential (primary) hypertension: Secondary | ICD-10-CM

## 2021-01-04 DIAGNOSIS — I255 Ischemic cardiomyopathy: Secondary | ICD-10-CM | POA: Diagnosis not present

## 2021-01-04 DIAGNOSIS — M25662 Stiffness of left knee, not elsewhere classified: Secondary | ICD-10-CM | POA: Diagnosis not present

## 2021-01-04 DIAGNOSIS — M25561 Pain in right knee: Secondary | ICD-10-CM | POA: Diagnosis not present

## 2021-01-04 DIAGNOSIS — M25661 Stiffness of right knee, not elsewhere classified: Secondary | ICD-10-CM | POA: Diagnosis not present

## 2021-01-04 DIAGNOSIS — R2689 Other abnormalities of gait and mobility: Secondary | ICD-10-CM | POA: Diagnosis not present

## 2021-01-04 DIAGNOSIS — I252 Old myocardial infarction: Secondary | ICD-10-CM

## 2021-01-04 DIAGNOSIS — R262 Difficulty in walking, not elsewhere classified: Secondary | ICD-10-CM | POA: Diagnosis not present

## 2021-01-04 DIAGNOSIS — E088 Diabetes mellitus due to underlying condition with unspecified complications: Secondary | ICD-10-CM

## 2021-01-04 DIAGNOSIS — I2510 Atherosclerotic heart disease of native coronary artery without angina pectoris: Secondary | ICD-10-CM

## 2021-01-04 DIAGNOSIS — E782 Mixed hyperlipidemia: Secondary | ICD-10-CM | POA: Diagnosis not present

## 2021-01-04 DIAGNOSIS — M6281 Muscle weakness (generalized): Secondary | ICD-10-CM | POA: Diagnosis not present

## 2021-01-04 NOTE — Therapy (Signed)
[facility] [address]  [place], [place], [address] Phone: [phone]   Fax:  [phone]  Physical Therapy Treatment  Patient Details  Name: [name] [name] MRN: [id number] Date of Birth: November 11, 1951 Referring Provider (PT): Ninfa Linden   Encounter Date: 01/04/2021   PT End of Session - 01/04/21 1611    Visit Number 2    Number of Visits 17    Date for PT Re-Evaluation 02/23/21    PT Start Time 7989    PT Stop Time 1619    PT Time Calculation (min) 46 min    Activity Tolerance Patient tolerated treatment well;Patient limited by pain    Behavior During Therapy [facility] for tasks assessed/performed           Past Medical History:  Diagnosis Date  . Arthritis of left knee 12/02/2020  . Bilateral leg edema 05/04/2017  . 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 CAD (coronary artery disease), native coronary artery 11/12/2018   Formatting of this note might be different from the original. S/p MI - Dr. Maurene Capes  . CAD, NATIVE VESSEL 06/15/2009   Qualifier: Diagnosis of  By: Haroldine Laws, MD, Eileen Stanford CAP (community acquired pneumonia) 08/17/2016  . Depressive disorder 11/12/2018  . Diabetes mellitus due to underlying condition with unspecified complications ([place]) 01/01/9416  . DM (diabetes mellitus) ([place])   . 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 Erythrocytosis 06/01/2020  . Essential hypertension 10/14/2007   Formatting of this note might be different from the original. Hypertension  10/1 IMO update  . Gout, unspecified 10/14/2007   Overview:  Gout  10/1 IMO update  Formatting of this note might be different from the original. Gout  10/1 IMO update  . History of colonic polyps 11/12/2018   Overview:  rpt colonoscopy 5/17; Dr. Haig Prophet of this note might be different from the original. rpt  colonoscopy 5/17; Dr. Erlene Quan  . History of kidney stones   . History of nephrolithiasis 11/12/2018  . History of smoking 11/12/2018   Overview:  quit 2000  Formatting of this note might be different from the original. quit 2000  . HTN (hypertension)   . Hyperlipidemia   . HYPERLIPIDEMIA-MIXED 10/14/2007   Qualifier: Diagnosis of  By: Haroldine Laws, MD, Willette Cluster of this note might be different from the original. Hyperlipidemia  . HYPERTENSION, BENIGN 06/15/2009  . 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 Ischemic cardiomyopathy 08/12/2020  . Medication intolerance 11/12/2018  . Mixed hyperlipidemia 11/12/2018  . Old myocardial infarction 11/12/2018  . Paroxysmal atrial fibrillation ([place]) 07/16/2020  . Pre-operative cardiovascular examination 11/12/2018  . Primary osteoarthritis of both knees 03/05/2017   Considering Zilretta injections. Good improvement with corticosteroid injections however short-lived. Mild improvement with Visco supplementation.  . Renal insufficiency 08/12/2020   pt denies  . Right carotid bruit 08/05/2014  . 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 Type II or unspecified type diabetes mellitus without mention of complication, not stated as uncontrolled 10/14/2007   Formatting of this note might be different from the original. Diabetes Mellitus  . Unilateral primary osteoarthritis, right knee 11/15/2018    Past Surgical History:  Procedure Laterality Date  .  BILATERAL KNEE ARTHROSCOPY    . CORONARY ANGIOPLASTY WITH STENT PLACEMENT    . LAPAROSCOPIC INGUINAL HERNIA REPAIR    . NOSE SURGERY    . TOTAL KNEE ARTHROPLASTY Right 11/15/2018   Procedure: RIGHT TOTAL KNEE ARTHROPLASTY;  Surgeon: Mcarthur Rossetti, MD;  Location: [facility];  Service: Orthopedics;  Laterality: Right;  . TOTAL KNEE ARTHROPLASTY Left 12/03/2020    Procedure: LEFT TOTAL KNEE ARTHROPLASTY;  Surgeon: Mcarthur Rossetti, MD;  Location: [facility];  Service: Orthopedics;  Laterality: Left;    There were no vitals filed for this visit.   Subjective Assessment - 01/04/21 1536    Subjective "Just hurting." Has been having some issues with his R heel- suspects plantar fasciitis which he has had in the past. Has done "some" of his exercises. Denies questions.    Patient is accompained by: Family member   son   Pertinent History HTN, arthritis, T2DM, history of heart attack with stents placed in 2009    Patient Stated Goals to decrease pain, improve strength, improve stability.    Currently in Pain? Yes    Pain Score 7     Pain Location Knee    Pain Orientation Left    Pain Descriptors / Indicators Aching;Tightness    Pain Type Acute pain    Multiple Pain Sites Yes    Pain Score 8    Pain Location Heel    Pain Orientation Right    Pain Descriptors / Indicators Aching;Dull;Sharp    Pain Type Acute pain;Chronic pain                             [facility] Adult PT Treatment/Exercise - 01/04/21 0001      Exercises   Exercises Knee/Hip      Knee/Hip Exercises: Stretches   Passive Hamstring Stretch Left;2 reps;30 seconds    Passive Hamstring Stretch Limitations supine with strap      Knee/Hip Exercises: Aerobic   Nustep L1 x 6 min (UEs/LEs)      Knee/Hip Exercises: Seated   Hamstring Curl Strengthening;Left;1 set;10 reps    Hamstring Limitations red TB      Knee/Hip Exercises: Supine   Quad Sets Strengthening;Left;1 set;10 reps    Quad Sets Limitations 10x3" with folded pillow under knee    Short Arc Quad Sets Strengthening;Left;1 set;15 reps    Short Arc Quad Sets Limitations large white foam roll under knees    Heel Slides AAROM;Left;1 set;10 reps    Heel Slides Limitations 10x3" with orange pball   cues to perform to tolerance   Straight Leg Raises Strengthening;Left;1 set;10 reps    Straight Leg Raises  Limitations quad lag visible      Vasopneumatic   Number Minutes Vasopneumatic  10 minutes    Vasopnuematic Location  Knee   L   Vasopneumatic Pressure Medium    Vasopneumatic Temperature  34      Manual Therapy   Manual Therapy Joint mobilization    Manual therapy comments supine    Joint Mobilization L patellar jt mobs in all direction grade 3/4                  PT Education - 01/04/21 1614    Education Details advised patient to try wearing ted hose that sits above knee rather than below knee to avoid accumulation of edema at knee joint    Person(s) Educated Patient    Methods Explanation  Comprehension Verbalized understanding            PT Short Term Goals - 01/04/21 1613      PT SHORT TERM GOAL #1   Title Independent with initial HEP    Time 2    Period Weeks    Status On-going    Target Date 01/12/21             PT Long Term Goals - 01/04/21 1613      PT LONG TERM GOAL #1   Title Independent with ongoing HEP    Time 8    Period Weeks    Status On-going      PT LONG TERM GOAL #2   Title R knee AROM >/= 3-115 dg to allow for normal gait and stair pattern    Time 8    Period Weeks    Status On-going      PT LONG TERM GOAL #3   Title B LE strength >/= 4+/5 for improved stability    Time 8    Period Weeks    Status On-going      PT LONG TERM GOAL #4   Title Patient will ambulate with normal gait pattern w/o AD to increase ease of community access    Time 8    Period Weeks    Status On-going      PT LONG TERM GOAL #5   Title Patient will ascend/descend 1 flight of stairs with good reciprocal pattern    Time 8    Period Weeks    Status On-going                 Plan - 01/04/21 1611    Clinical Impression Statement Patient arrived to session in transport chair- noting pain in the R heel which is limiting him in his mobility. Notes limited compliance with HEP but denies questions. Upon observation, L knee moderately edematous  with tourniquet-like effect at the calf from compression sock, however B calves nontender and without redness or warmth. Patient tolerated L patellar mobilizations with fairly good mobility noted. Patient performed L knee AAROM and quad strengthening ther-ex in nonWBing positioning to avoid exacerbation of R heel pain. Patient demonstrated quad lag with SLRs, suggesting quad weakness. Limitation in ROM may also be limited by muscle length, thus stretching was performed.  Overall, patient was able to perform all ther-ex despite L knee pain and demonstrated good effort throughout. Ended session with [place] to L knee for pain relief. No further complaints at end of session.    Comorbidities HTN, arthritis, T2DM, history of heart attack with stents placed in 2009, previously had R TKA    PT Treatment/Interventions ADLs/Self Care Home Management;Vasopneumatic Device;Cryotherapy;Electrical Stimulation;Iontophoresis 4mg /ml Dexamethasone;Neuromuscular re-education;Balance training;Therapeutic exercise;Therapeutic activities;Functional mobility training;Stair training;Gait training;Patient/family education;Manual techniques;Joint Manipulations;Taping;Scar mobilization    PT Next Visit Plan Knee FOTO, Tolerance and carryover of HEP, follow up regarding MD visit (12/30/20 per pt report). Progress TE, gait training  as tolerated. Manual and modalities as needed for mobility and pain mgmt    PT Home Exercise Plan Access Code: Basile . Supine Heel Slide with Strap , Seated Heel Slide , Supine Quadricep Sets, Seated Hamstring Stretch    Consulted and Agree with Plan of Care Patient           Patient will benefit from skilled therapeutic intervention in order to improve the following deficits and impairments:  Abnormal gait,Decreased range of motion,Difficulty walking,Increased fascial restricitons,Increased muscle spasms,Decreased endurance,Decreased activity tolerance,Pain,Improper body  mechanics,Impaired  flexibility,Decreased balance,Decreased mobility,Decreased strength,Increased edema,Postural dysfunction  Visit Diagnosis: Acute pain of left knee  Other abnormalities of gait and mobility  Difficulty in walking, not elsewhere classified  Localized edema  Muscle weakness (generalized)  Stiffness of left knee, not elsewhere classified     Problem List Patient Active Problem List   Diagnosis Date Noted  . History of kidney stones   . 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 Arthritis of left knee 12/02/2020  . Renal insufficiency 08/12/2020  . Ischemic cardiomyopathy 08/12/2020  . CAD (coronary artery disease)   . DM (diabetes mellitus) ([place])   . HTN (hypertension)   . Hyperlipidemia   . Diabetes mellitus due to underlying condition with unspecified complications ([place]) 38/32/9191  . Paroxysmal atrial fibrillation ([place]) 07/16/2020  . Erythrocytosis 06/01/2020  . Unilateral primary osteoarthritis, right knee 11/15/2018  . 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 Depressive disorder 11/12/2018  . 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 History of colonic polyps 11/12/2018  . History of nephrolithiasis 11/12/2018  . History of smoking 11/12/2018  . 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 Medication intolerance 11/12/2018  . Old myocardial infarction 11/12/2018  . Mixed hyperlipidemia 11/12/2018  . Pre-operative cardiovascular examination 11/12/2018  . CAD (coronary artery disease), native coronary artery 11/12/2018  . Bilateral leg edema 05/04/2017  . Primary osteoarthritis of both knees 03/05/2017  . CAP (community acquired pneumonia) 08/17/2016  . Right carotid bruit 08/05/2014  . HYPERTENSION, BENIGN 06/15/2009  . CAD, NATIVE VESSEL 06/15/2009  . Type II or unspecified type diabetes mellitus without mention of complication, not stated as uncontrolled  10/14/2007  . Gout, unspecified 10/14/2007  . Essential hypertension 10/14/2007     Janene Harvey, PT, DPT 01/04/21 5:01 PM   [facility] [address]  [place], [place], [address] Phone: [phone]   Fax:  [phone]  Name: [name] [name] MRN: [id number] Date of Birth: 1950-12-10

## 2021-01-04 NOTE — Patient Instructions (Signed)

## 2021-01-04 NOTE — Progress Notes (Signed)
Cardiology Office Note:    Date:  01/04/2021   ID:  [name] [name], DOB 05/30/1951, MRN [id number]  PCP:  Raeanne Gathers, MD  Cardiologist:  Jenean Lindau, MD   Referring MD: Raeanne Gathers, MD    ASSESSMENT:    1. Old myocardial infarction   2. Atherosclerosis of native coronary artery of native heart without angina pectoris   3. Essential hypertension   4. Mixed hyperlipidemia   5. Coronary artery disease involving native coronary artery of native heart without angina pectoris   6. Paroxysmal atrial fibrillation (HCC)   7. Ischemic cardiomyopathy   8. Diabetes mellitus due to underlying condition with unspecified complications ([place])   9. Renal insufficiency    PLAN:    In order of problems listed above:  1. Coronary artery disease: Secondary prevention stressed with the patient.  Importance of compliance with diet medication stressed and vocalized understanding. 2. Ischemic cardiomyopathy: Blood pressure stable.  Ischemic cardiomyopathy, diet and weight checks and all these issues were discussed with him and he vocalized understanding.  I wanted to consider medication such as Wilder Glade but he is not keen on it at this time he wants to discuss this with him in the next appointment I respect his wishes. 3. Paroxysmal atrial fibrillation: Post stroke:I discussed with the patient atrial fibrillation, disease process. Management and therapy including rate and rhythm control, anticoagulation benefits and potential risks were discussed extensively with the patient. Patient had multiple questions which were answered to patient's satisfaction. 4. Essential hypertension: Blood pressure stable and diet was emphasized. 5. Diabetes mellitus and mixed dyslipidemia: Managed by primary care physician.  Diet emphasized.  Numbers reviewed.Patient will be seen in follow-up appointment in 6 months or earlier if the patient has any concerns    Medication Adjustments/Labs and Tests  Ordered: Current medicines are reviewed at length with the patient today.  Concerns regarding medicines are outlined above.  No orders of the defined types were placed in this encounter.  No orders of the defined types were placed in this encounter.    No chief complaint on file.    History of Present Illness:    [name] [name] is a 70 y.o. male.  Patient has past medical history of coronary artery disease, ischemic cardiomyopathy, diabetes mellitus dyslipidemia.  He denies any problems at this time and takes care of activities of daily living.  No chest pain orthopnea or PND.  At the time of my evaluation, the patient is alert awake oriented and in no distress.  He had a knee replacement surgery on the left side and improving better.  Is been a month and he is getting rehab therapy.  At the time of my evaluation, the patient is alert awake oriented and in no distress.  Past Medical History:  Diagnosis Date  . Arthritis of left knee 12/02/2020  . Bilateral leg edema 05/04/2017  . 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 CAD (coronary artery disease), native coronary artery 11/12/2018   Formatting of this note might be different from the original. S/p MI - Dr. Maurene Capes  . CAD, NATIVE VESSEL 06/15/2009   Qualifier: Diagnosis of  By: Haroldine Laws, MD, Eileen Stanford CAP (community acquired pneumonia) 08/17/2016  . Depressive disorder 11/12/2018  . Diabetes mellitus due to underlying condition with unspecified complications ([place]) 06/30/9146  . DM (diabetes mellitus) (  [place])   . 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 Erythrocytosis 06/01/2020  . Essential hypertension 10/14/2007   Formatting of this note might be different from the original. Hypertension  10/1 IMO update  . Gout, unspecified 10/14/2007   Overview:  Gout  10/1 IMO update  Formatting of this note might be different from  the original. Gout  10/1 IMO update  . History of colonic polyps 11/12/2018   Overview:  rpt colonoscopy 5/17; Dr. Haig Prophet of this note might be different from the original. rpt colonoscopy 5/17; Dr. Erlene Quan  . History of kidney stones   . History of nephrolithiasis 11/12/2018  . History of smoking 11/12/2018   Overview:  quit 2000  Formatting of this note might be different from the original. quit 2000  . HTN (hypertension)   . Hyperlipidemia   . HYPERLIPIDEMIA-MIXED 10/14/2007   Qualifier: Diagnosis of  By: Haroldine Laws, MD, Willette Cluster of this note might be different from the original. Hyperlipidemia  . HYPERTENSION, BENIGN 06/15/2009  . 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 Ischemic cardiomyopathy 08/12/2020  . Medication intolerance 11/12/2018  . Mixed hyperlipidemia 11/12/2018  . Old myocardial infarction 11/12/2018  . Paroxysmal atrial fibrillation ([place]) 07/16/2020  . Pre-operative cardiovascular examination 11/12/2018  . Primary osteoarthritis of both knees 03/05/2017   Considering Zilretta injections. Good improvement with corticosteroid injections however short-lived. Mild improvement with Visco supplementation.  . Renal insufficiency 08/12/2020   pt denies  . Right carotid bruit 08/05/2014  . 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 Type II or unspecified type diabetes mellitus without mention of complication, not stated as uncontrolled 10/14/2007   Formatting of this note might be different from the original. Diabetes Mellitus  . Unilateral primary osteoarthritis, right knee 11/15/2018    Past Surgical History:  Procedure Laterality Date  . BILATERAL KNEE ARTHROSCOPY    . CORONARY ANGIOPLASTY WITH STENT PLACEMENT    . LAPAROSCOPIC INGUINAL HERNIA REPAIR    . NOSE SURGERY    . TOTAL KNEE ARTHROPLASTY Right 11/15/2018    Procedure: RIGHT TOTAL KNEE ARTHROPLASTY;  Surgeon: Mcarthur Rossetti, MD;  Location: [facility];  Service: Orthopedics;  Laterality: Right;  . TOTAL KNEE ARTHROPLASTY Left 12/03/2020   Procedure: LEFT TOTAL KNEE ARTHROPLASTY;  Surgeon: Mcarthur Rossetti, MD;  Location: [facility];  Service: Orthopedics;  Laterality: Left;    Current Medications: Current Meds  Medication Sig  . ALPRAZolam (XANAX) 0.25 MG tablet Take 0.25 mg by mouth at bedtime as needed for anxiety.   Marland Kitchen aspirin 81 MG tablet Take 81 mg by mouth at bedtime.  . chlorthalidone (HYGROTON) 25 MG tablet Take 25 mg by mouth daily in the afternoon.  Marland Kitchen CIALIS 20 MG tablet Take 20 mg by mouth daily as needed for erectile dysfunction.   . cyanocobalamin (,VITAMIN B-12,) 1000 MCG/ML injection Inject 1,000 mcg into the muscle every 30 (thirty) days.   Marland Kitchen doxycycline (VIBRA-TABS) 100 MG tablet Take 1 tablet (100 mg total) by mouth 2 (two) times daily.  . fluticasone (FLONASE) 50 MCG/ACT nasal spray Place 1 spray into both nostrils daily as needed for allergies or rhinitis.  Marland Kitchen glipiZIDE (GLUCOTROL XL) 10 MG 24 hr tablet Take 10 mg by mouth daily with breakfast.  . HYDROmorphone (DILAUDID) 2 MG tablet Take 1 tablet (2 mg total) by mouth every 4 (four)  hours as needed for severe pain.  Marland Kitchen LANTUS SOLOSTAR 100 UNIT/ML Solostar Pen Inject 25 Units into the skin at bedtime.  . levalbuterol (XOPENEX HFA) 45 MCG/ACT inhaler Inhale 1 puff into the lungs every 4 (four) hours as needed for wheezing.  . Potassium 99 MG TABS Take 99 mg by mouth at bedtime.  . rivaroxaban (XARELTO) 20 MG TABS tablet Take 1 tablet (20 mg total) by mouth daily with supper.  . rosuvastatin (CRESTOR) 20 MG tablet Take 20 mg by mouth at bedtime.  Marland Kitchen TART CHERRY PO Take 1 tablet by mouth at bedtime.  Marland Kitchen telmisartan (MICARDIS) 80 MG tablet Take 40 mg by mouth at bedtime.  . traMADol (ULTRAM) 50 MG tablet Take 25 mg by mouth every 4 (four) hours as needed for pain or severe  pain.     Allergies:   Allopurinol, Aripiprazole, Bupropion, Buspirone, Desvenlafaxine, Duloxetine, Escitalopram, Fish oil, Fluticasone furoate, Hydroxyzine hcl, Nortriptyline, Paroxetine hcl, Ramipril, Sertraline, Venlafaxine, and Vilazodone   Social History   Socioeconomic History  . Marital status: Widowed    Spouse name: Not on file  . Number of children: Not on file  . Years of education: Not on file  . Highest education level: Not on file  Occupational History  . Not on file  Tobacco Use  . Smoking status: Former Smoker    Packs/day: 0.50    Years: 10.00    Pack years: 5.00  . Smokeless tobacco: Former Systems developer    Types: Snuff, Chew    Quit date: 11/20/1968  . Tobacco comment: in his 81's  Vaping Use  . Vaping Use: Never used  Substance and Sexual Activity  . Alcohol use: No  . Drug use: No  . Sexual activity: Yes  Other Topics Concern  . Not on file  Social History Narrative  . Not on file   Social Determinants of Health   Financial Resource Strain: Not on file  Food Insecurity: Not on file  Transportation Needs: Not on file  Physical Activity: Not on file  Stress: Not on file  Social Connections: Not on file     Family History: The patient's family history includes Cancer in his mother; Diabetes in his father; Heart attack in his father; Hypertension in his brother, father, and mother; Irregular heart beat in his brother.  ROS:   Please see the history of present illness.    All other systems reviewed and are negative.  EKGs/Labs/Other Studies Reviewed:    The following studies were reviewed today: IMPRESSIONS    1. Left ventricular ejection fraction, by estimation, is 35 to 40%. The  left ventricle has moderately decreased function. The left ventricle has  no regional wall motion abnormalities. Left ventricular diastolic function  could not be evaluated.  2. Left atrial size was moderate to severe.  3. The mitral valve is normal in structure. Mild  mitral valve  regurgitation. No evidence of mitral stenosis.  4. 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    Recent Labs: 09/22/2020: ALT 17; TSH 3.370 12/04/2020: BUN 27; Creatinine, Ser 1.49; Hemoglobin 14.0; Platelets 125; Potassium 4.1; Sodium 137  Recent Lipid Panel    Component Value Date/Time   CHOL  02/10/2011 0700    93        ATP III CLASSIFICATION:  <200     mg/dL   Desirable  200-239  mg/dL   Borderline High  >=240    mg/dL   High  TRIG 149 02/10/2011 0700   HDL 30 (L) 02/10/2011 0700   CHOLHDL 3.1 02/10/2011 0700   VLDL 30 02/10/2011 0700   LDLCALC  02/10/2011 0700    33        Total Cholesterol/HDL:CHD Risk Coronary Heart Disease Risk Table                     Men   Women  1/2 Average Risk   3.4   3.3  Average Risk       5.0   4.4  2 X Average Risk   9.6   7.1  3 X Average Risk  23.4   11.0        Use the calculated Patient Ratio above and the CHD Risk Table to determine the patient's CHD Risk.        ATP III CLASSIFICATION (LDL):  <100     mg/dL   Optimal  100-129  mg/dL   Near or Above                    Optimal  130-159  mg/dL   Borderline  160-189  mg/dL   High  >190     mg/dL   Very High    Physical Exam:    VS:  BP 122/72   Pulse 82   Ht 6\' 1"  (1.854 m)   Wt 212 lb 1.3 oz (96.2 kg)   SpO2 94%   BMI 27.98 kg/m     Wt Readings from Last 3 Encounters:  01/04/21 212 lb 1.3 oz (96.2 kg)  12/03/20 231 lb 14.8 oz (105.2 kg)  11/25/20 232 lb (105.2 kg)     GEN: Patient is in no acute distress HEENT: Normal NECK: No JVD; No carotid bruits LYMPHATICS: No lymphadenopathy CARDIAC: Hear sounds regular, 2/6 systolic murmur at the apex. RESPIRATORY:  Clear to auscultation without rales, wheezing or rhonchi  ABDOMEN: Soft, non-tender, non-distended MUSCULOSKELETAL:  No edema; No deformity  SKIN: Warm and dry NEUROLOGIC:  Alert and oriented x 3 PSYCHIATRIC:  Normal affect   Signed, Jenean Lindau, MD  01/04/2021 2:29 PM    [place]

## 2021-01-06 ENCOUNTER — Other Ambulatory Visit: Payer: Self-pay

## 2021-01-06 ENCOUNTER — Ambulatory Visit: Payer: Medicare Other

## 2021-01-06 DIAGNOSIS — R262 Difficulty in walking, not elsewhere classified: Secondary | ICD-10-CM

## 2021-01-06 DIAGNOSIS — M25662 Stiffness of left knee, not elsewhere classified: Secondary | ICD-10-CM

## 2021-01-06 DIAGNOSIS — R2689 Other abnormalities of gait and mobility: Secondary | ICD-10-CM

## 2021-01-06 DIAGNOSIS — M25661 Stiffness of right knee, not elsewhere classified: Secondary | ICD-10-CM | POA: Diagnosis not present

## 2021-01-06 DIAGNOSIS — R600 Localized edema: Secondary | ICD-10-CM | POA: Diagnosis not present

## 2021-01-06 DIAGNOSIS — M25562 Pain in left knee: Secondary | ICD-10-CM | POA: Diagnosis not present

## 2021-01-06 DIAGNOSIS — M6281 Muscle weakness (generalized): Secondary | ICD-10-CM | POA: Diagnosis not present

## 2021-01-06 DIAGNOSIS — M25561 Pain in right knee: Secondary | ICD-10-CM | POA: Diagnosis not present

## 2021-01-06 NOTE — Therapy (Signed)
[facility] [address]  [place], [place], [address] Phone: [phone]   Fax:  [phone]  Physical Therapy Treatment  Patient Details  Name: [name] [name] MRN: [id number] Date of Birth: [birth year] Referring Provider (PT): Ninfa Linden   Encounter Date: 01/06/2021   PT End of Session - 01/06/21 1435    Visit Number 3    Number of Visits 17    Date for PT Re-Evaluation 02/23/21    PT Start Time 1324    PT Stop Time 1406    PT Time Calculation (min) 42 min    Activity Tolerance Patient tolerated treatment well;Patient limited by pain    Behavior During Therapy [facility] for tasks assessed/performed           Past Medical History:  Diagnosis Date  . Arthritis of left knee 12/02/2020  . Bilateral leg edema 05/04/2017  . 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 CAD (coronary artery disease), native coronary artery 11/12/2018   Formatting of this note might be different from the original. S/p MI - Dr. Maurene Capes  . CAD, NATIVE VESSEL 06/15/2009   Qualifier: Diagnosis of  By: Haroldine Laws, MD, Eileen Stanford CAP (community acquired pneumonia) 08/17/2016  . Depressive disorder 11/12/2018  . Diabetes mellitus due to underlying condition with unspecified complications ([place]) 0/62/6948  . DM (diabetes mellitus) ([place])   . 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 Erythrocytosis 06/01/2020  . Essential hypertension 10/14/2007   Formatting of this note might be different from the original. Hypertension  10/1 IMO update  . Gout, unspecified 10/14/2007   Overview:  Gout  10/1 IMO update  Formatting of this note might be different from the original. Gout  10/1 IMO update  . History of colonic polyps 11/12/2018   Overview:  rpt colonoscopy 5/17; Dr. Haig Prophet of this note might be different from the original. rpt  colonoscopy 5/17; Dr. Erlene Quan  . History of kidney stones   . History of nephrolithiasis 11/12/2018  . History of smoking 11/12/2018   Overview:  quit 2000  Formatting of this note might be different from the original. quit 2000  . HTN (hypertension)   . Hyperlipidemia   . HYPERLIPIDEMIA-MIXED 10/14/2007   Qualifier: Diagnosis of  By: Haroldine Laws, MD, Willette Cluster of this note might be different from the original. Hyperlipidemia  . HYPERTENSION, BENIGN 06/15/2009  . 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 Ischemic cardiomyopathy 08/12/2020  . Medication intolerance 11/12/2018  . Mixed hyperlipidemia 11/12/2018  . Old myocardial infarction 11/12/2018  . Paroxysmal atrial fibrillation ([place]) 07/16/2020  . Pre-operative cardiovascular examination 11/12/2018  . Primary osteoarthritis of both knees 03/05/2017   Considering Zilretta injections. Good improvement with corticosteroid injections however short-lived. Mild improvement with Visco supplementation.  . Renal insufficiency 08/12/2020   pt denies  . Right carotid bruit 08/05/2014  . 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 Type II or unspecified type diabetes mellitus without mention of complication, not stated as uncontrolled 10/14/2007   Formatting of this note might be different from the original. Diabetes Mellitus  . Unilateral primary osteoarthritis, right knee 11/15/2018    Past Surgical History:  Procedure Laterality Date  .  BILATERAL KNEE ARTHROSCOPY    . CORONARY ANGIOPLASTY WITH STENT PLACEMENT    . LAPAROSCOPIC INGUINAL HERNIA REPAIR    . NOSE SURGERY    . TOTAL KNEE ARTHROPLASTY Right 11/15/2018   Procedure: RIGHT TOTAL KNEE ARTHROPLASTY;  Surgeon: Mcarthur Rossetti, MD;  Location: [facility];  Service: Orthopedics;  Laterality: Right;  . TOTAL KNEE ARTHROPLASTY Left 12/03/2020    Procedure: LEFT TOTAL KNEE ARTHROPLASTY;  Surgeon: Mcarthur Rossetti, MD;  Location: [facility];  Service: Orthopedics;  Laterality: Left;    There were no vitals filed for this visit.   Subjective Assessment - 01/06/21 1330    Subjective Pt reports he has been able to walk around the house fine, home exercises are going well, notes pain in lateral knee into hs tendon.    Patient is accompained by: Family member    Pertinent History HTN, arthritis, T2DM, history of heart attack with stents placed in 2009    Limitations Sitting;Lifting;Standing;Walking    Patient Stated Goals to decrease pain, improve strength, improve stability.    Currently in Pain? Yes    Pain Score 8     Pain Location Knee    Pain Orientation Lateral;Left    Pain Descriptors / Indicators Shooting    Pain Type Acute pain                             [facility] Adult PT Treatment/Exercise - 01/06/21 0001      Exercises   Exercises Knee/Hip      Knee/Hip Exercises: Aerobic   Nustep L3x68min      Knee/Hip Exercises: Standing   Hip Flexion Stengthening;Both;2 sets;10 reps;Knee bent    Hip Flexion Limitations marches; counter for assitance    Functional Squat 1 set;10 reps    Functional Squat Limitations mini squat; counter for assist      Knee/Hip Exercises: Supine   Short Arc Quad Sets Both;2 sets;10 reps    Short Arc Quad Sets Limitations bolster under knees    Heel Slides AAROM;Left;1 set;10 reps    Heel Slides Limitations 10x3 sec      Vasopneumatic   Number Minutes Vasopneumatic  10 minutes    Vasopnuematic Location  Knee    Vasopneumatic Pressure Low    Vasopneumatic Temperature  34      Manual Therapy   Manual Therapy Joint mobilization;Passive ROM    Manual therapy comments supine    Joint Mobilization L patellar mobs in all direction grade 3/4    Passive ROM knee flex and ext gentle stretching in end range                    PT Short Term Goals - 01/04/21 1613       PT SHORT TERM GOAL #1   Title Independent with initial HEP    Time 2    Period Weeks    Status On-going    Target Date 01/12/21             PT Long Term Goals - 01/04/21 1613      PT LONG TERM GOAL #1   Title Independent with ongoing HEP    Time 8    Period Weeks    Status On-going      PT LONG TERM GOAL #2   Title R knee AROM >/= 3-115 dg to allow for normal gait and stair pattern    Time 8  Period Weeks    Status On-going      PT LONG TERM GOAL #3   Title B LE strength >/= 4+/5 for improved stability    Time 8    Period Weeks    Status On-going      PT LONG TERM GOAL #4   Title Patient will ambulate with normal gait pattern w/o AD to increase ease of community access    Time 8    Period Weeks    Status On-going      PT LONG TERM GOAL #5   Title Patient will ascend/descend 1 flight of stairs with good reciprocal pattern    Time 8    Period Weeks    Status On-going                 Plan - 01/06/21 1437    Clinical Impression Statement Pt showed up 9 min late to session. Pt responded well to exercises, somewhat limited by pain. Attempted seated LAQ w/o weight but deferred d/t c/o lateral knee pain from pt, otherwise all exercises were pain free per pt statement. Introduced standing marches and mini squats for more WB activties and to encourage knee flexion. Cues for proper performance of exercises and to prevent any substitution movements for optimal functional benefits. Game ready post session to address residual swelling and soreness.    Comorbidities HTN, arthritis, T2DM, history of heart attack with stents placed in 2009, previously had R TKA    PT Frequency 2x / week    PT Duration 8 weeks    PT Treatment/Interventions ADLs/Self Care Home Management;Vasopneumatic Device;Cryotherapy;Electrical Stimulation;Iontophoresis 4mg /ml Dexamethasone;Neuromuscular re-education;Balance training;Therapeutic exercise;Therapeutic activities;Functional mobility  training;Stair training;Gait training;Patient/family education;Manual techniques;Joint Manipulations;Taping;Scar mobilization    PT Next Visit Plan Knee FOTO, Tolerance and carryover of HEP, follow up regarding MD visit (12/30/20 per pt report). Progress TE, gait training  as tolerated. Manual and modalities as needed for mobility and pain mgmt    PT Home Exercise Plan Access Code: [email or web address] . Supine Heel Slide with Strap , Seated Heel Slide , Supine Quadricep Sets, Seated Hamstring Stretch    Consulted and Agree with Plan of Care Patient           Patient will benefit from skilled therapeutic intervention in order to improve the following deficits and impairments:  Abnormal gait,Decreased range of motion,Difficulty walking,Increased fascial restricitons,Increased muscle spasms,Decreased endurance,Decreased activity tolerance,Pain,Improper body mechanics,Impaired flexibility,Decreased balance,Decreased mobility,Decreased strength,Increased edema,Postural dysfunction  Visit Diagnosis: Acute pain of left knee  Other abnormalities of gait and mobility  Difficulty in walking, not elsewhere classified  Localized edema  Muscle weakness (generalized)  Stiffness of left knee, not elsewhere classified  Stiffness of right knee, not elsewhere classified  Acute pain of right knee     Problem List Patient Active Problem List   Diagnosis Date Noted  . History of kidney stones   . 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 Arthritis of left knee 12/02/2020  . Renal insufficiency 08/12/2020  . Ischemic cardiomyopathy 08/12/2020  . CAD (coronary artery disease)   . DM (diabetes mellitus) ([place])   . HTN (hypertension)   . Hyperlipidemia   . Diabetes mellitus due to underlying condition with unspecified complications ([place]) 97/12/6376  . Paroxysmal atrial fibrillation () 07/16/2020  . Erythrocytosis 06/01/2020  . Unilateral primary osteoarthritis, right knee 11/15/2018  . Status  post total right knee replacement 11/15/2018  . Depressive disorder 11/12/2018  . 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 History  of colonic polyps 11/12/2018  . History of nephrolithiasis 11/12/2018  . History of smoking 11/12/2018  . 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 Medication intolerance 11/12/2018  . Old myocardial infarction 11/12/2018  . Mixed hyperlipidemia 11/12/2018  . Pre-operative cardiovascular examination 11/12/2018  . CAD (coronary artery disease), native coronary artery 11/12/2018  . Bilateral leg edema 05/04/2017  . Primary osteoarthritis of both knees 03/05/2017  . CAP (community acquired pneumonia) 08/17/2016  . Right carotid bruit 08/05/2014  . HYPERTENSION, BENIGN 06/15/2009  . CAD, NATIVE VESSEL 06/15/2009  . Type II or unspecified type diabetes mellitus without mention of complication, not stated as uncontrolled 10/14/2007  . Gout, unspecified 10/14/2007  . Essential hypertension 10/14/2007    Artist Pais, PTA 01/06/2021, 3:01 PM  [facility] [address]  [name], [place], [address] Phone: [phone]   Fax:  [phone]  Name: [name] [name] MRN: [id number] Date of Birth: [birth year]

## 2021-01-11 ENCOUNTER — Ambulatory Visit: Payer: Medicare Other | Admitting: Physical Therapy

## 2021-01-11 ENCOUNTER — Encounter: Payer: Self-pay | Admitting: Physical Therapy

## 2021-01-11 ENCOUNTER — Other Ambulatory Visit: Payer: Self-pay

## 2021-01-11 DIAGNOSIS — R262 Difficulty in walking, not elsewhere classified: Secondary | ICD-10-CM | POA: Diagnosis not present

## 2021-01-11 DIAGNOSIS — R600 Localized edema: Secondary | ICD-10-CM | POA: Diagnosis not present

## 2021-01-11 DIAGNOSIS — M25662 Stiffness of left knee, not elsewhere classified: Secondary | ICD-10-CM

## 2021-01-11 DIAGNOSIS — R2689 Other abnormalities of gait and mobility: Secondary | ICD-10-CM | POA: Diagnosis not present

## 2021-01-11 DIAGNOSIS — M25561 Pain in right knee: Secondary | ICD-10-CM | POA: Diagnosis not present

## 2021-01-11 DIAGNOSIS — M25562 Pain in left knee: Secondary | ICD-10-CM | POA: Diagnosis not present

## 2021-01-11 DIAGNOSIS — M25661 Stiffness of right knee, not elsewhere classified: Secondary | ICD-10-CM | POA: Diagnosis not present

## 2021-01-11 DIAGNOSIS — M6281 Muscle weakness (generalized): Secondary | ICD-10-CM

## 2021-01-11 NOTE — Therapy (Signed)
[facility] [address]  [place], [place], [address] Phone: [phone]   Fax:  [phone]  Physical Therapy Treatment  Patient Details  Name: [name] [name] MRN: [id number] Date of Birth: [birth year] Referring Provider (PT): Ninfa Linden   Encounter Date: 01/11/2021   PT End of Session - 01/11/21 1446    Visit Number 4    Number of Visits 17    Date for PT Re-Evaluation 02/23/21    PT Start Time 3646   pt late   PT Stop Time 1452    PT Time Calculation (min) 44 min    Activity Tolerance Patient tolerated treatment well;Patient limited by pain    Behavior During Therapy [facility] for tasks assessed/performed           Past Medical History:  Diagnosis Date  . Arthritis of left knee 12/02/2020  . Bilateral leg edema 05/04/2017  . 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 CAD (coronary artery disease), native coronary artery 11/12/2018   Formatting of this note might be different from the original. S/p MI - Dr. Maurene Capes  . CAD, NATIVE VESSEL 06/15/2009   Qualifier: Diagnosis of  By: Haroldine Laws, MD, Eileen Stanford CAP (community acquired pneumonia) 08/17/2016  . Depressive disorder 11/12/2018  . Diabetes mellitus due to underlying condition with unspecified complications ([place]) 06/22/2121  . DM (diabetes mellitus) ([place])   . 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 Erythrocytosis 06/01/2020  . Essential hypertension 10/14/2007   Formatting of this note might be different from the original. Hypertension  10/1 IMO update  . Gout, unspecified 10/14/2007   Overview:  Gout  10/1 IMO update  Formatting of this note might be different from the original. Gout  10/1 IMO update  . History of colonic polyps 11/12/2018   Overview:  rpt colonoscopy 5/17; Dr. Haig Prophet of this note might be different from the original. rpt  colonoscopy 5/17; Dr. Erlene Quan  . History of kidney stones   . History of nephrolithiasis 11/12/2018  . History of smoking 11/12/2018   Overview:  quit 2000  Formatting of this note might be different from the original. quit 2000  . HTN (hypertension)   . Hyperlipidemia   . HYPERLIPIDEMIA-MIXED 10/14/2007   Qualifier: Diagnosis of  By: Haroldine Laws, MD, Willette Cluster of this note might be different from the original. Hyperlipidemia  . HYPERTENSION, BENIGN 06/15/2009  . 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 Ischemic cardiomyopathy 08/12/2020  . Medication intolerance 11/12/2018  . Mixed hyperlipidemia 11/12/2018  . Old myocardial infarction 11/12/2018  . Paroxysmal atrial fibrillation ([place]) 07/16/2020  . Pre-operative cardiovascular examination 11/12/2018  . Primary osteoarthritis of both knees 03/05/2017   Considering Zilretta injections. Good improvement with corticosteroid injections however short-lived. Mild improvement with Visco supplementation.  . Renal insufficiency 08/12/2020   pt denies  . Right carotid bruit 08/05/2014  . 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 Type II or unspecified type diabetes mellitus without mention of complication, not stated as uncontrolled 10/14/2007   Formatting of this note might be different from the original. Diabetes Mellitus  . Unilateral primary osteoarthritis, right knee 11/15/2018    Past Surgical History:  Procedure  Laterality Date  . BILATERAL KNEE ARTHROSCOPY    . CORONARY ANGIOPLASTY WITH STENT PLACEMENT    . LAPAROSCOPIC INGUINAL HERNIA REPAIR    . NOSE SURGERY    . TOTAL KNEE ARTHROPLASTY Right 11/15/2018   Procedure: RIGHT TOTAL KNEE ARTHROPLASTY;  Surgeon: Mcarthur Rossetti, MD;  Location: [facility];  Service: Orthopedics;  Laterality: Right;  . TOTAL KNEE ARTHROPLASTY Left 12/03/2020    Procedure: LEFT TOTAL KNEE ARTHROPLASTY;  Surgeon: Mcarthur Rossetti, MD;  Location: [facility];  Service: Orthopedics;  Laterality: Left;    There were no vitals filed for this visit.   Subjective Assessment - 01/11/21 1411    Subjective Has been trying to walk with the walker more often at home- R heel has been feeling better.    Patient is accompained by: Family member   son   Pertinent History HTN, arthritis, T2DM, history of heart attack with stents placed in 2009    How long can you walk comfortably? short household distances, increased pain with functional mobility in the house. most limited by pain.    Patient Stated Goals to decrease pain, improve strength, improve stability.    Currently in Pain? Yes    Pain Score 8     Pain Location Knee    Pain Orientation Left;Medial;Lateral    Pain Descriptors / Indicators Shooting    Pain Type Acute pain              [facility] PT Assessment - 01/11/21 0001      AROM   Left Knee Extension 10    Left Knee Flexion 87      PROM   Left Knee Extension 8    Left Knee Flexion 90   pain     Strength   Right Hip Flexion 4+/5    Right Hip ABduction 4+/5    Right Hip ADduction 4+/5    Left Hip Flexion 4+/5    Left Hip ABduction 4+/5    Left Hip ADduction 4+/5    Right Knee Flexion 4+/5    Right Knee Extension 4+/5    Left Knee Flexion 4/5    Left Knee Extension 4/5    Right Ankle Dorsiflexion 4+/5    Right Ankle Plantar Flexion 4+/5    Left Ankle Dorsiflexion 4+/5    Left Ankle Plantar Flexion 5/5                         [facility] Adult PT Treatment/Exercise - 01/11/21 0001      Ambulation/Gait   Ambulation Distance (Feet) 90 Feet    Assistive device Rolling walker    Gait Pattern Decreased arm swing - right;Step-through pattern;Decreased step length - left;Decreased stance time - left;Decreased stride length;Decreased weight shift to left;Poor foot clearance - left;Trunk flexed    Ambulation Surface Level;Indoor     Gait Comments cues for upright trunk      Knee/Hip Exercises: Stretches   Passive Hamstring Stretch Left;2 reps;30 seconds    Passive Hamstring Stretch Limitations supine with strap    Other Knee/Hip Stretches L knee flexion stretch 5x5"      Knee/Hip Exercises: Aerobic   Stationary Bike partial revolutions for ROM x6 min      Knee/Hip Exercises: Seated   Sit to Sand 1 set;5 reps;with UE support   sitting on airex with 1 UE support     Knee/Hip Exercises: Supine   Straight Leg Raises Strengthening;Left;1 set;10 reps  Straight Leg Raises Limitations quad lag visible   cues for quad set before each rep     Vasopneumatic   Number Minutes Vasopneumatic  10 minutes    Vasopnuematic Location  Knee   L   Vasopneumatic Pressure --   no pressure   Vasopneumatic Temperature  34                  PT Education - 01/11/21 1445    Education Details edu on signs/symptoms of DVT and advised to present to ED if symptoms present; discussion on objective progress    Person(s) Educated Patient    Methods Explanation            PT Short Term Goals - 01/11/21 1447      PT SHORT TERM GOAL #1   Title Independent with initial HEP    Time 2    Period Weeks    Status Achieved    Target Date 01/12/21             PT Long Term Goals - 01/11/21 1447      PT LONG TERM GOAL #1   Title Independent with ongoing HEP    Time 8    Period Weeks    Status Partially Met   met for current     PT LONG TERM GOAL #2   Title R knee AROM >/= 3-115 dg to allow for normal gait and stair pattern    Time 8    Period Weeks    Status On-going   AROM 10-87 degrees, PROM 8-90 degrees     PT LONG TERM GOAL #3   Title B LE strength >/= 4+/5 for improved stability    Time 8    Period Weeks    Status Partially Met   limited in L knee flexion, extension     PT LONG TERM GOAL #4   Title Patient will ambulate with normal gait pattern w/o AD to increase ease of community access    Time 8     Period Weeks    Status On-going   gait deviations still present, requiring cues to correct     PT LONG TERM GOAL #5   Title Patient will ascend/descend 1 flight of stairs with good reciprocal pattern    Time 8    Period Weeks    Status On-going   NT                Plan - 01/11/21 1447    Clinical Impression Statement Patient arrived to session ambulating with RW, noting that he has been trying to ambulate with RW more often. L lower leg purple discoloration similar to hemosiderin staining evident at start of session and patient TTP over anteromedial lower leg. However, leg is free of excessive edema or warmth and pt reporting that this discoloration has been evident since surgery. Notes that he has recently finished up a course of antibiotics for this issue. Educated patient on signs and symptoms of DVT and advised to present to ED if these symptoms present- patient reported understanding. Strength testing revealed good improvements throughout, however with L knee flexion and extension strength limiting. AROM now reaching 10-87 degrees, PROM 8-90 degrees after stretching. Patient still demonstrates gait deviations when ambulating with RW, requiring cues for upright trunk. Patient tolerated session well and is demonstrating good progress towards goals. Ended session with [place] to L knee- patient without complaints at end of session.    Comorbidities HTN, arthritis,  T2DM, history of heart attack with stents placed in 2009, previously had R TKA    PT Frequency 2x / week    PT Duration 8 weeks    PT Treatment/Interventions ADLs/Self Care Home Management;Vasopneumatic Device;Cryotherapy;Electrical Stimulation;Iontophoresis 63m/ml Dexamethasone;Neuromuscular re-education;Balance training;Therapeutic exercise;Therapeutic activities;Functional mobility training;Stair training;Gait training;Patient/family education;Manual techniques;Joint Manipulations;Taping;Scar mobilization    PT Next Visit  Plan Knee FOTO, Tolerance and carryover of HEP, follow up regarding MD visit (12/30/20 per pt report). Progress TE, gait training  as tolerated. Manual and modalities as needed for mobility and pain mgmt    PT Home Exercise Plan Access Code: [email or web address]. Supine Heel Slide with Strap , Seated Heel Slide , Supine Quadricep Sets, Seated Hamstring Stretch    Consulted and Agree with Plan of Care Patient           Patient will benefit from skilled therapeutic intervention in order to improve the following deficits and impairments:  Abnormal gait,Decreased range of motion,Difficulty walking,Increased fascial restricitons,Increased muscle spasms,Decreased endurance,Decreased activity tolerance,Pain,Improper body mechanics,Impaired flexibility,Decreased balance,Decreased mobility,Decreased strength,Increased edema,Postural dysfunction  Visit Diagnosis: Acute pain of left knee  Other abnormalities of gait and mobility  Difficulty in walking, not elsewhere classified  Localized edema  Muscle weakness (generalized)  Stiffness of left knee, not elsewhere classified     Problem List Patient Active Problem List   Diagnosis Date Noted  . History of kidney stones   . 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 Arthritis of left knee 12/02/2020  . Renal insufficiency 08/12/2020  . Ischemic cardiomyopathy 08/12/2020  . CAD (coronary artery disease)   . DM (diabetes mellitus) ([place]   . HTN (hypertension)   . Hyperlipidemia   . Diabetes mellitus due to underlying condition with unspecified complications ([place] 057/11/7791 . Paroxysmal atrial fibrillation ([place] 07/16/2020  . Erythrocytosis 06/01/2020  . Unilateral primary osteoarthritis, right knee 11/15/2018  . 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 Depressive disorder 11/12/2018  . 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 History of colonic polyps 11/12/2018  . History of nephrolithiasis 11/12/2018   . History of smoking 11/12/2018  . 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 Medication intolerance 11/12/2018  . Old myocardial infarction 11/12/2018  . Mixed hyperlipidemia 11/12/2018  . Pre-operative cardiovascular examination 11/12/2018  . CAD (coronary artery disease), native coronary artery 11/12/2018  . Bilateral leg edema 05/04/2017  . Primary osteoarthritis of both knees 03/05/2017  . CAP (community acquired pneumonia) 08/17/2016  . Right carotid bruit 08/05/2014  . HYPERTENSION, BENIGN 06/15/2009  . CAD, NATIVE VESSEL 06/15/2009  . Type II or unspecified type diabetes mellitus without mention of complication, not stated as uncontrolled 10/14/2007  . Gout, unspecified 10/14/2007  . Essential hypertension 10/14/2007     YJanene Harvey PT, DPT 01/11/21 2:54 PM   [facility] [place] [address]Phone: [phone]  Fax:  [phone] Name: [name] CARLYONMRN: [id number] of Birth: 127-Mar-1952

## 2021-01-13 ENCOUNTER — Ambulatory Visit: Payer: Medicare Other | Admitting: Physical Therapy

## 2021-01-13 ENCOUNTER — Other Ambulatory Visit: Payer: Self-pay

## 2021-01-13 ENCOUNTER — Encounter: Payer: Self-pay | Admitting: Physician Assistant

## 2021-01-13 ENCOUNTER — Encounter: Payer: Self-pay | Admitting: Physical Therapy

## 2021-01-13 ENCOUNTER — Ambulatory Visit (INDEPENDENT_AMBULATORY_CARE_PROVIDER_SITE_OTHER): Payer: Medicare Other | Admitting: Physician Assistant

## 2021-01-13 DIAGNOSIS — R262 Difficulty in walking, not elsewhere classified: Secondary | ICD-10-CM | POA: Diagnosis not present

## 2021-01-13 DIAGNOSIS — Z96652 Presence of left artificial knee joint: Secondary | ICD-10-CM

## 2021-01-13 DIAGNOSIS — R600 Localized edema: Secondary | ICD-10-CM

## 2021-01-13 DIAGNOSIS — M25662 Stiffness of left knee, not elsewhere classified: Secondary | ICD-10-CM

## 2021-01-13 DIAGNOSIS — M25661 Stiffness of right knee, not elsewhere classified: Secondary | ICD-10-CM | POA: Diagnosis not present

## 2021-01-13 DIAGNOSIS — M6281 Muscle weakness (generalized): Secondary | ICD-10-CM

## 2021-01-13 DIAGNOSIS — M25561 Pain in right knee: Secondary | ICD-10-CM | POA: Diagnosis not present

## 2021-01-13 DIAGNOSIS — R2689 Other abnormalities of gait and mobility: Secondary | ICD-10-CM | POA: Diagnosis not present

## 2021-01-13 DIAGNOSIS — M25562 Pain in left knee: Secondary | ICD-10-CM | POA: Diagnosis not present

## 2021-01-13 DIAGNOSIS — M76821 Posterior tibial tendinitis, right leg: Secondary | ICD-10-CM

## 2021-01-13 NOTE — Addendum Note (Signed)
Addended by: Robyne Peers on: 01/13/2021 04:52 PM   Modules accepted: Orders

## 2021-01-13 NOTE — Therapy (Signed)
[facility] [address]  [place], [place], [address] Phone: [phone]   Fax:  [phone]  Physical Therapy Treatment  Patient Details  Name: [name] [name] MRN: [id number] Date of Birth: [birth year] Referring Provider (PT): Ninfa Linden   Encounter Date: 01/13/2021   PT End of Session - 01/13/21 1355    Visit Number 5    Number of Visits 17    Date for PT Re-Evaluation 02/23/21    PT Start Time 1309    PT Stop Time 1400    PT Time Calculation (min) 51 min    Equipment Utilized During Treatment Gait belt    Activity Tolerance Patient tolerated treatment well;Patient limited by pain    Behavior During Therapy [facility] for tasks assessed/performed           Past Medical History:  Diagnosis Date  . Arthritis of left knee 12/02/2020  . Bilateral leg edema 05/04/2017  . 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 CAD (coronary artery disease), native coronary artery 11/12/2018   Formatting of this note might be different from the original. S/p MI - Dr. Maurene Capes  . CAD, NATIVE VESSEL 06/15/2009   Qualifier: Diagnosis of  By: Haroldine Laws, MD, Eileen Stanford CAP (community acquired pneumonia) 08/17/2016  . Depressive disorder 11/12/2018  . Diabetes mellitus due to underlying condition with unspecified complications ([place]) 0/05/1218  . DM (diabetes mellitus) ([place])   . 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 Erythrocytosis 06/01/2020  . Essential hypertension 10/14/2007   Formatting of this note might be different from the original. Hypertension  10/1 IMO update  . Gout, unspecified 10/14/2007   Overview:  Gout  10/1 IMO update  Formatting of this note might be different from the original. Gout  10/1 IMO update  . History of colonic polyps 11/12/2018   Overview:  rpt colonoscopy 5/17; Dr. Haig Prophet of this note  might be different from the original. rpt colonoscopy 5/17; Dr. Erlene Quan  . History of kidney stones   . History of nephrolithiasis 11/12/2018  . History of smoking 11/12/2018   Overview:  quit 2000  Formatting of this note might be different from the original. quit 2000  . HTN (hypertension)   . Hyperlipidemia   . HYPERLIPIDEMIA-MIXED 10/14/2007   Qualifier: Diagnosis of  By: Haroldine Laws, MD, Willette Cluster of this note might be different from the original. Hyperlipidemia  . HYPERTENSION, BENIGN 06/15/2009  . 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 Ischemic cardiomyopathy 08/12/2020  . Medication intolerance 11/12/2018  . Mixed hyperlipidemia 11/12/2018  . Old myocardial infarction 11/12/2018  . Paroxysmal atrial fibrillation ([place]) 07/16/2020  . Pre-operative cardiovascular examination 11/12/2018  . Primary osteoarthritis of both knees 03/05/2017   Considering Zilretta injections. Good improvement with corticosteroid injections however short-lived. Mild improvement with Visco supplementation.  . Renal insufficiency 08/12/2020   pt denies  . Right carotid bruit 08/05/2014  . 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 Type II or unspecified type diabetes mellitus without mention of complication, not stated as uncontrolled 10/14/2007   Formatting of this note might be different from the original. Diabetes Mellitus  . Unilateral primary osteoarthritis, right knee 11/15/2018  Past Surgical History:  Procedure Laterality Date  . BILATERAL KNEE ARTHROSCOPY    . CORONARY ANGIOPLASTY WITH STENT PLACEMENT    . LAPAROSCOPIC INGUINAL HERNIA REPAIR    . NOSE SURGERY    . TOTAL KNEE ARTHROPLASTY Right 11/15/2018   Procedure: RIGHT TOTAL KNEE ARTHROPLASTY;  Surgeon: Mcarthur Rossetti, MD;  Location: [facility];  Service: Orthopedics;  Laterality: Right;   . TOTAL KNEE ARTHROPLASTY Left 12/03/2020   Procedure: LEFT TOTAL KNEE ARTHROPLASTY;  Surgeon: Mcarthur Rossetti, MD;  Location: [facility];  Service: Orthopedics;  Laterality: Left;    There were no vitals filed for this visit.   Subjective Assessment - 01/13/21 1311    Subjective R heel is killing me today- not sure why. Seeing his MD today.    Patient is accompained by: Family member   son   Pertinent History HTN, arthritis, T2DM, history of heart attack with stents placed in 2009    How long can you walk comfortably? short household distances, increased pain with functional mobility in the house. most limited by pain.    Patient Stated Goals to decrease pain, improve strength, improve stability.    Currently in Pain? Yes    Pain Score 8     Pain Location Knee    Pain Orientation Left    Pain Descriptors / Indicators Shooting    Pain Type Acute pain    Pain Score 10    Pain Location Heel   pointing to R medial mid foot   Pain Orientation Right    Pain Descriptors / Indicators Aching;Dull;Sharp    Pain Type Acute pain;Chronic pain                             [facility] Adult PT Treatment/Exercise - 01/13/21 0001      Knee/Hip Exercises: Stretches   Hip Flexor Stretch Left;2 reps;30 seconds    Hip Flexor Stretch Limitations supine with strap and foot resting on 4" step   pt reporting lateral knee pain     Knee/Hip Exercises: Aerobic   Stationary Bike partial revolutions for ROM x6 min      Knee/Hip Exercises: Standing   Hip Flexion Stengthening;Both;Knee bent;1 set;20 reps    Hip Flexion Limitations 2# in walker   fairly upright trunk   Forward Step Up Left;1 set;10 reps;Hand Hold: 2;Hand Hold: 1;Step Height: 4"    Forward Step Up Limitations 5x in walker, 5x with chair support   good stability   Functional Squat 1 set;10 reps    Functional Squat Limitations chair for support   cues to avoid forward trunk lean     Knee/Hip Exercises: Seated   Long Arc Quad  Strengthening;Left;1 set;10 reps    Long Arc Quad Weight 2 lbs.    Long [employer] Limitations lacking TKE   c/o lateral knee pain   Hamstring Curl Strengthening;Left;1 set;10 reps    Hamstring Limitations green TB   cues to increase eccentric control     Knee/Hip Exercises: Supine   Quad Sets Strengthening;Left;1 set;5 reps    Quad Sets Limitations 5x5" with bolster under anlke      Vasopneumatic   Number Minutes Vasopneumatic  10 minutes    Vasopnuematic Location  Knee   L   Vasopneumatic Pressure --   no pressure   Vasopneumatic Temperature  34  PT Short Term Goals - 01/11/21 1447      PT SHORT TERM GOAL #1   Title Independent with initial HEP    Time 2    Period Weeks    Status Achieved    Target Date 01/12/21             PT Long Term Goals - 01/11/21 1447      PT LONG TERM GOAL #1   Title Independent with ongoing HEP    Time 8    Period Weeks    Status Partially Met   met for current     PT LONG TERM GOAL #2   Title R knee AROM >/= 3-115 dg to allow for normal gait and stair pattern    Time 8    Period Weeks    Status On-going   AROM 10-87 degrees, PROM 8-90 degrees     PT LONG TERM GOAL #3   Title B LE strength >/= 4+/5 for improved stability    Time 8    Period Weeks    Status Partially Met   limited in L knee flexion, extension     PT LONG TERM GOAL #4   Title Patient will ambulate with normal gait pattern w/o AD to increase ease of community access    Time 8    Period Weeks    Status On-going   gait deviations still present, requiring cues to correct     PT LONG TERM GOAL #5   Title Patient will ascend/descend 1 flight of stairs with good reciprocal pattern    Time 8    Period Weeks    Status On-going   NT                Plan - 01/13/21 1355    Clinical Impression Statement Patient arrived to session ambulating with RW, reporting return of severe R heel pain without known cause. Attempted standing LE  strengthening ther-ex while trying to avoid exacerbation of heel pain. Patient required cues to correct anterior trunk lean throughout. Initiated short step ups with UE support with patient demonstrating good tolerance and stability. intermittent sitting rest breaks taken throughout standing ther-ex d/t R heel pain. Patient demonstrating lack of TKE with lightly weighted LAQ and c/o lateral knee pain with quad sets today. Limited in tolerance for hip flexor and quad stretching d/t lateral knee pain. Patient's L lower leg appears much less discolored today however still with TTP over posterior tib- ended session with [place] to L knee with no pressure to err on side of caution until f/u with MD today. Patient without complaints at end of session.    Comorbidities HTN, arthritis, T2DM, history of heart attack with stents placed in 2009, previously had R TKA    PT Frequency 2x / week    PT Duration 8 weeks    PT Treatment/Interventions ADLs/Self Care Home Management;Vasopneumatic Device;Cryotherapy;Electrical Stimulation;Iontophoresis 43m/ml Dexamethasone;Neuromuscular re-education;Balance training;Therapeutic exercise;Therapeutic activities;Functional mobility training;Stair training;Gait training;Patient/family education;Manual techniques;Joint Manipulations;Taping;Scar mobilization    PT Next Visit Plan Progress TE, gait training  as tolerated. Manual and modalities as needed for mobility and pain mgmt    PT Home Exercise Plan Access Code: [email or web address]. Supine Heel Slide with Strap , Seated Heel Slide , Supine Quadricep Sets, Seated Hamstring Stretch    Consulted and Agree with Plan of Care Patient           Patient will benefit from skilled therapeutic intervention in order to improve the following deficits  and impairments:  Abnormal gait,Decreased range of motion,Difficulty walking,Increased fascial restricitons,Increased muscle spasms,Decreased endurance,Decreased activity tolerance,Pain,Improper body  mechanics,Impaired flexibility,Decreased balance,Decreased mobility,Decreased strength,Increased edema,Postural dysfunction  Visit Diagnosis: Acute pain of left knee  Other abnormalities of gait and mobility  Difficulty in walking, not elsewhere classified  Localized edema  Muscle weakness (generalized)  Stiffness of left knee, not elsewhere classified     Problem List Patient Active Problem List   Diagnosis Date Noted  . History of kidney stones   . 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 Arthritis of left knee 12/02/2020  . Renal insufficiency 08/12/2020  . Ischemic cardiomyopathy 08/12/2020  . CAD (coronary artery disease)   . DM (diabetes mellitus) ([place])   . HTN (hypertension)   . Hyperlipidemia   . Diabetes mellitus due to underlying condition with unspecified complications ([place]) 22/48/2500  . Paroxysmal atrial fibrillation ([place]) 07/16/2020  . Erythrocytosis 06/01/2020  . Unilateral primary osteoarthritis, right knee 11/15/2018  . 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 Depressive disorder 11/12/2018  . 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 History of colonic polyps 11/12/2018  . History of nephrolithiasis 11/12/2018  . History of smoking 11/12/2018  . 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 Medication intolerance 11/12/2018  . Old myocardial infarction 11/12/2018  . Mixed hyperlipidemia 11/12/2018  . Pre-operative cardiovascular examination 11/12/2018  . CAD (coronary artery disease), native coronary artery 11/12/2018  . Bilateral leg edema 05/04/2017  . Primary osteoarthritis of both knees 03/05/2017  . CAP (community acquired pneumonia) 08/17/2016  . Right carotid bruit 08/05/2014  . HYPERTENSION, BENIGN 06/15/2009  . CAD, NATIVE VESSEL 06/15/2009  . Type II or unspecified type diabetes mellitus without mention of complication, not stated  as uncontrolled 10/14/2007  . Gout, unspecified 10/14/2007  . Essential hypertension 10/14/2007     Janene Harvey, PT, DPT 01/13/21 2:06 PM   [facility] [address]  [place], [place], [address] Phone: [phone]   Fax:  [phone]  Name: SEVERN GODDARD MRN: [id number] Date of Birth: November 20, 1951

## 2021-01-13 NOTE — Progress Notes (Signed)
HPI: [name] [name] returns today status post left total knee arthroplasty 12/03/2020.  He still using a walker.  He feels he is overall trending towards improvement.  Taken tramadol as needed for pain.  He has no concerns in regards to his left knee.  He is working with physical therapy on range of motion strengthening.  He notes that the lower extremity edema has improved with the use of compression hose and doxycycline. As a new complaint of the right foot pain.  He is unsure if this is due to the fact that he has not been wearing his orthotics or the fact that these putting more weight on his right leg.  He has had no known injury.  Physical exam: Left knee he lacks full extension by few degrees.  He has flexion to approximately 105 degrees.  No instability valgus varus stressing.  Surgical incisions healing well.  Decreased erythema in the edema left lower leg.  Calf supple nontender.  Right foot: Able to invert the foot against gravity but not against resistance.  Is too many toe sign on the right.  Unable to do a single heel rise on the right.  Tenderness over the posterior tibial tendon insertion at the navicular region.  There is no bruising or erythema in the area of the posterior tibial tendon.  Impression: Status post left total knee arthroplasty 12/03/2020 Right posterior tibial tendinitis.  Plan: We will send him to physical therapy for strengthening modalities and home exercise program for his right posterior tibial tendon needs to continue his inserts for his shoes.  In regards to his knee he will continue physical therapy for range of motion strengthening.  He will follow up with [place] in 1 month sooner if there is any questions concerns.

## 2021-01-14 ENCOUNTER — Telehealth: Payer: Self-pay | Admitting: *Deleted

## 2021-01-14 NOTE — Telephone Encounter (Signed)
Ortho bundle 30 day call completed. °

## 2021-01-18 ENCOUNTER — Ambulatory Visit: Payer: Medicare Other | Attending: Orthopaedic Surgery | Admitting: Physical Therapy

## 2021-01-18 ENCOUNTER — Encounter: Payer: Self-pay | Admitting: Physical Therapy

## 2021-01-18 ENCOUNTER — Other Ambulatory Visit: Payer: Self-pay

## 2021-01-18 DIAGNOSIS — M6281 Muscle weakness (generalized): Secondary | ICD-10-CM | POA: Insufficient documentation

## 2021-01-18 DIAGNOSIS — R2689 Other abnormalities of gait and mobility: Secondary | ICD-10-CM | POA: Diagnosis not present

## 2021-01-18 DIAGNOSIS — M25662 Stiffness of left knee, not elsewhere classified: Secondary | ICD-10-CM | POA: Diagnosis not present

## 2021-01-18 DIAGNOSIS — R600 Localized edema: Secondary | ICD-10-CM | POA: Insufficient documentation

## 2021-01-18 DIAGNOSIS — M25562 Pain in left knee: Secondary | ICD-10-CM | POA: Diagnosis not present

## 2021-01-18 DIAGNOSIS — R262 Difficulty in walking, not elsewhere classified: Secondary | ICD-10-CM | POA: Diagnosis not present

## 2021-01-18 NOTE — Therapy (Signed)
[facility] [address]  [place], [place], [address] Phone: [phone]   Fax:  [phone]  Physical Therapy Treatment  Patient Details  Name: [name] [name] MRN: [id number] Date of Birth: Mar 18, 1951 Referring Provider (PT): Ninfa Linden   Encounter Date: 01/18/2021   PT End of Session - 01/18/21 1407    Visit Number 6    Number of Visits 17    Date for PT Re-Evaluation 02/23/21    PT Start Time 3582    PT Stop Time 1411    PT Time Calculation (min) 54 min    Equipment Utilized During Treatment Gait belt    Activity Tolerance Patient tolerated treatment well;Patient limited by pain    Behavior During Therapy [facility] for tasks assessed/performed           Past Medical History:  Diagnosis Date  . Arthritis of left knee 12/02/2020  . Bilateral leg edema 05/04/2017  . 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 CAD (coronary artery disease), native coronary artery 11/12/2018   Formatting of this note might be different from the original. S/p MI - Dr. Maurene Capes  . CAD, NATIVE VESSEL 06/15/2009   Qualifier: Diagnosis of  By: Haroldine Laws, MD, Eileen Stanford CAP (community acquired pneumonia) 08/17/2016  . Depressive disorder 11/12/2018  . Diabetes mellitus due to underlying condition with unspecified complications ([place]) 04/06/9841  . DM (diabetes mellitus) ([place])   . 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 Erythrocytosis 06/01/2020  . Essential hypertension 10/14/2007   Formatting of this note might be different from the original. Hypertension  10/1 IMO update  . Gout, unspecified 10/14/2007   Overview:  Gout  10/1 IMO update  Formatting of this note might be different from the original. Gout  10/1 IMO update  . History of colonic polyps 11/12/2018   Overview:  rpt colonoscopy 5/17; Dr. Haig Prophet of this note  might be different from the original. rpt colonoscopy 5/17; Dr. Erlene Quan  . History of kidney stones   . History of nephrolithiasis 11/12/2018  . History of smoking 11/12/2018   Overview:  quit 2000  Formatting of this note might be different from the original. quit 2000  . HTN (hypertension)   . Hyperlipidemia   . HYPERLIPIDEMIA-MIXED 10/14/2007   Qualifier: Diagnosis of  By: Haroldine Laws, MD, Willette Cluster of this note might be different from the original. Hyperlipidemia  . HYPERTENSION, BENIGN 06/15/2009  . 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 Ischemic cardiomyopathy 08/12/2020  . Medication intolerance 11/12/2018  . Mixed hyperlipidemia 11/12/2018  . Old myocardial infarction 11/12/2018  . Paroxysmal atrial fibrillation ([place]) 07/16/2020  . Pre-operative cardiovascular examination 11/12/2018  . Primary osteoarthritis of both knees 03/05/2017   Considering Zilretta injections. Good improvement with corticosteroid injections however short-lived. Mild improvement with Visco supplementation.  . Renal insufficiency 08/12/2020   pt denies  . Right carotid bruit 08/05/2014  . 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 Type II or unspecified type diabetes mellitus without mention of complication, not stated as uncontrolled 10/14/2007   Formatting of this note might be different from the original. Diabetes Mellitus  . Unilateral primary osteoarthritis, right knee 11/15/2018  Past Surgical History:  Procedure Laterality Date  . BILATERAL KNEE ARTHROSCOPY    . CORONARY ANGIOPLASTY WITH STENT PLACEMENT    . LAPAROSCOPIC INGUINAL HERNIA REPAIR    . NOSE SURGERY    . TOTAL KNEE ARTHROPLASTY Right 11/15/2018   Procedure: RIGHT TOTAL KNEE ARTHROPLASTY;  Surgeon: Mcarthur Rossetti, MD;  Location: [facility];  Service: Orthopedics;  Laterality: Right;   . TOTAL KNEE ARTHROPLASTY Left 12/03/2020   Procedure: LEFT TOTAL KNEE ARTHROPLASTY;  Surgeon: Mcarthur Rossetti, MD;  Location: [facility];  Service: Orthopedics;  Laterality: Left;    There were no vitals filed for this visit.   Subjective Assessment - 01/18/21 1318    Subjective Reports that his MD appointment went okay- however notes that after taking his gout medicine for 2 day the pain is improved. Has not been wearing his orthotics. Denies episodes of buckling in the L knee recently.    Pertinent History HTN, arthritis, T2DM, history of heart attack with stents placed in 2009    How long can you walk comfortably? short household distances, increased pain with functional mobility in the house. most limited by pain.    Patient Stated Goals to decrease pain, improve strength, improve stability.    Currently in Pain? Yes    Pain Score 8     Pain Location Knee    Pain Orientation Left;Lateral    Pain Descriptors / Indicators Sharp    Pain Type Acute pain                             [facility] Adult PT Treatment/Exercise - 01/18/21 0001      Ambulation/Gait   Ambulation Distance (Feet) 120 Feet    Assistive device Straight cane    Gait Pattern Decreased arm swing - right;Step-through pattern;Decreased step length - left;Decreased stance time - left;Decreased stride length;Decreased weight shift to left;Poor foot clearance - left;Trunk flexed    Ambulation Surface Level;Indoor    Gait Comments cues to maintain upright trunk and increase B step length      Knee/Hip Exercises: Stretches   ITB Stretch Limitations attempted supine L TFL stretch, supine KTOS, and sidelying L TFL stretch but pt could not tolerate d/t L lateral knee pain      Knee/Hip Exercises: Aerobic   Nustep L4x20mn (UEs/LEs)      Knee/Hip Exercises: Seated   Knee/Hip Flexion B heel raise in sitting 10x, 2nd set with 10# over R knee   cues to increase ROM   Other Seated Knee/Hip Exercises L & R ankle  inversion/eversion with red TB 10x   manual cues to avoid hip compensation; more difficulty on R     Vasopneumatic   Number Minutes Vasopneumatic  10 minutes    Vasopnuematic Location  Knee   L   Vasopneumatic Pressure Low    Vasopneumatic Temperature  34                  PT Education - 01/18/21 1406    Education Details update to HEP; administered red and yellow TB    Person(s) Educated Patient    Methods Explanation;Demonstration;Tactile cues;Verbal cues;Handout    Comprehension Returned demonstration;Verbalized understanding            PT Short Term Goals - 01/11/21 1447      PT SHORT TERM GOAL #1   Title Independent with initial HEP    Time 2    Period Weeks  Status Achieved    Target Date 01/12/21             PT Long Term Goals - 01/11/21 1447      PT LONG TERM GOAL #1   Title Independent with ongoing HEP    Time 8    Period Weeks    Status Partially Met   met for current     PT LONG TERM GOAL #2   Title R knee AROM >/= 3-115 dg to allow for normal gait and stair pattern    Time 8    Period Weeks    Status On-going   AROM 10-87 degrees, PROM 8-90 degrees     PT LONG TERM GOAL #3   Title B LE strength >/= 4+/5 for improved stability    Time 8    Period Weeks    Status Partially Met   limited in L knee flexion, extension     PT LONG TERM GOAL #4   Title Patient will ambulate with normal gait pattern w/o AD to increase ease of community access    Time 8    Period Weeks    Status On-going   gait deviations still present, requiring cues to correct     PT LONG TERM GOAL #5   Title Patient will ascend/descend 1 flight of stairs with good reciprocal pattern    Time 8    Period Weeks    Status On-going   NT                Plan - 01/18/21 1407    Clinical Impression Statement Patient arrived to session with report of improved R foot pain after taking his gout medicine. New referral from MD received and advised to perform R posterior  tibialis strengthening. Worked on gait training with SPC, providing cues for proper SPC sequencing, maintaining upright trunk, and increasing B step length. Patient appeared stable throughout, thus encouraged patient to practice using SPC in the home, RW out in the community- patient reported understanding. Initiated B ankle strengthening with patient demonstrating heavy hip compensations, requiring manual and verbal cues to correct. Updated HEP with these exercises for continued practice. Patient noted L lateral knee pain, thus attempted several TFL stretches to relieve this, however patient could not tolerate. Ended session with Gameready to L LE for pain and edema relief. Patient without further complaints at end of session.    Comorbidities HTN, arthritis, T2DM, history of heart attack with stents placed in 2009, previously had R TKA    PT Frequency 2x / week    PT Duration 8 weeks    PT Treatment/Interventions ADLs/Self Care Home Management;Vasopneumatic Device;Cryotherapy;Electrical Stimulation;Iontophoresis 36m/ml Dexamethasone;Neuromuscular re-education;Balance training;Therapeutic exercise;Therapeutic activities;Functional mobility training;Stair training;Gait training;Patient/family education;Manual techniques;Joint Manipulations;Taping;Scar mobilization    PT Next Visit Plan Progress TE, gait training  as tolerated. Manual and modalities as needed for mobility and pain mgmt    PT Home Exercise Plan Access Code: [email or web address]. Supine Heel Slide with Strap , Seated Heel Slide , Supine Quadricep Sets, Seated Hamstring Stretch    Consulted and Agree with Plan of Care Patient           Patient will benefit from skilled therapeutic intervention in order to improve the following deficits and impairments:  Abnormal gait,Decreased range of motion,Difficulty walking,Increased fascial restricitons,Increased muscle spasms,Decreased endurance,Decreased activity tolerance,Pain,Improper body mechanics,Impaired  flexibility,Decreased balance,Decreased mobility,Decreased strength,Increased edema,Postural dysfunction  Visit Diagnosis: Acute pain of left knee  Other abnormalities of gait and mobility  Difficulty  in walking, not elsewhere classified  Localized edema  Muscle weakness (generalized)  Stiffness of left knee, not elsewhere classified     Problem List Patient Active Problem List   Diagnosis Date Noted  . History of kidney stones   . 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 Arthritis of left knee 12/02/2020  . Renal insufficiency 08/12/2020  . Ischemic cardiomyopathy 08/12/2020  . CAD (coronary artery disease)   . DM (diabetes mellitus) ([place])   . HTN (hypertension)   . Hyperlipidemia   . Diabetes mellitus due to underlying condition with unspecified complications ([place]) 18/59/0931  . Paroxysmal atrial fibrillation ([place]) 07/16/2020  . Erythrocytosis 06/01/2020  . Unilateral primary osteoarthritis, right knee 11/15/2018  . 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 Depressive disorder 11/12/2018  . 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 History of colonic polyps 11/12/2018  . History of nephrolithiasis 11/12/2018  . History of smoking 11/12/2018  . 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 Medication intolerance 11/12/2018  . Old myocardial infarction 11/12/2018  . Mixed hyperlipidemia 11/12/2018  . Pre-operative cardiovascular examination 11/12/2018  . CAD (coronary artery disease), native coronary artery 11/12/2018  . Bilateral leg edema 05/04/2017  . Primary osteoarthritis of both knees 03/05/2017  . CAP (community acquired pneumonia) 08/17/2016  . Right carotid bruit 08/05/2014  . HYPERTENSION, BENIGN 06/15/2009  . CAD, NATIVE VESSEL 06/15/2009  . Type II or unspecified type diabetes mellitus without mention of complication, not stated as uncontrolled  10/14/2007  . Gout, unspecified 10/14/2007  . Essential hypertension 10/14/2007     Janene Harvey, PT, DPT 01/18/21 2:19 PM   [facility] [address]  [place], [place], [address] Phone: [phone]   Fax:  [phone]  Name: [name] [name] MRN: [id number] Date of Birth: [birth year]

## 2021-01-20 ENCOUNTER — Ambulatory Visit: Payer: Medicare Other | Admitting: Physical Therapy

## 2021-01-20 ENCOUNTER — Other Ambulatory Visit: Payer: Self-pay

## 2021-01-20 ENCOUNTER — Encounter: Payer: Self-pay | Admitting: Physical Therapy

## 2021-01-20 DIAGNOSIS — M25662 Stiffness of left knee, not elsewhere classified: Secondary | ICD-10-CM | POA: Diagnosis not present

## 2021-01-20 DIAGNOSIS — R2689 Other abnormalities of gait and mobility: Secondary | ICD-10-CM | POA: Diagnosis not present

## 2021-01-20 DIAGNOSIS — R262 Difficulty in walking, not elsewhere classified: Secondary | ICD-10-CM

## 2021-01-20 DIAGNOSIS — R600 Localized edema: Secondary | ICD-10-CM

## 2021-01-20 DIAGNOSIS — M25562 Pain in left knee: Secondary | ICD-10-CM | POA: Diagnosis not present

## 2021-01-20 DIAGNOSIS — M6281 Muscle weakness (generalized): Secondary | ICD-10-CM | POA: Diagnosis not present

## 2021-01-20 NOTE — Therapy (Signed)
[facility] [address]  [place], [place], [address] Phone: [phone]   Fax:  [phone]  Physical Therapy Treatment  Patient Details  Name: [name] [name] MRN: [id number] Date of Birth: [birth year] Referring Provider (PT): Ninfa Linden   Encounter Date: 01/20/2021   PT End of Session - 01/20/21 1356    Visit Number 7    Number of Visits 17    Date for PT Re-Evaluation 02/23/21    PT Start Time 8250    PT Stop Time 1404    PT Time Calculation (min) 46 min    Equipment Utilized During Treatment Gait belt    Activity Tolerance Patient tolerated treatment well;Patient limited by pain    Behavior During Therapy [facility] for tasks assessed/performed           Past Medical History:  Diagnosis Date  . Arthritis of left knee 12/02/2020  . Bilateral leg edema 05/04/2017  . 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 CAD (coronary artery disease), native coronary artery 11/12/2018   Formatting of this note might be different from the original. S/p MI - Dr. Maurene Capes  . CAD, NATIVE VESSEL 06/15/2009   Qualifier: Diagnosis of  By: Haroldine Laws, MD, Eileen Stanford CAP (community acquired pneumonia) 08/17/2016  . Depressive disorder 11/12/2018  . Diabetes mellitus due to underlying condition with unspecified complications ([place]) 5/39/7673  . DM (diabetes mellitus) ([place])   . 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 Erythrocytosis 06/01/2020  . Essential hypertension 10/14/2007   Formatting of this note might be different from the original. Hypertension  10/1 IMO update  . Gout, unspecified 10/14/2007   Overview:  Gout  10/1 IMO update  Formatting of this note might be different from the original. Gout  10/1 IMO update  . History of colonic polyps 11/12/2018   Overview:  rpt colonoscopy 5/17; Dr. Haig Prophet of this note  might be different from the original. rpt colonoscopy 5/17; Dr. Erlene Quan  . History of kidney stones   . History of nephrolithiasis 11/12/2018  . History of smoking 11/12/2018   Overview:  quit 2000  Formatting of this note might be different from the original. quit 2000  . HTN (hypertension)   . Hyperlipidemia   . HYPERLIPIDEMIA-MIXED 10/14/2007   Qualifier: Diagnosis of  By: Haroldine Laws, MD, Willette Cluster of this note might be different from the original. Hyperlipidemia  . HYPERTENSION, BENIGN 06/15/2009  . 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 Ischemic cardiomyopathy 08/12/2020  . Medication intolerance 11/12/2018  . Mixed hyperlipidemia 11/12/2018  . Old myocardial infarction 11/12/2018  . Paroxysmal atrial fibrillation ([place]) 07/16/2020  . Pre-operative cardiovascular examination 11/12/2018  . Primary osteoarthritis of both knees 03/05/2017   Considering Zilretta injections. Good improvement with corticosteroid injections however short-lived. Mild improvement with Visco supplementation.  . Renal insufficiency 08/12/2020   pt denies  . Right carotid bruit 08/05/2014  . 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 Type II or unspecified type diabetes mellitus without mention of complication, not stated as uncontrolled 10/14/2007   Formatting of this note might be different from the original. Diabetes Mellitus  . Unilateral primary osteoarthritis, right knee 11/15/2018  Past Surgical History:  Procedure Laterality Date  . BILATERAL KNEE ARTHROSCOPY    . CORONARY ANGIOPLASTY WITH STENT PLACEMENT    . LAPAROSCOPIC INGUINAL HERNIA REPAIR    . NOSE SURGERY    . TOTAL KNEE ARTHROPLASTY Right 11/15/2018   Procedure: RIGHT TOTAL KNEE ARTHROPLASTY;  Surgeon: Mcarthur Rossetti, MD;  Location: [facility];  Service: Orthopedics;  Laterality: Right;   . TOTAL KNEE ARTHROPLASTY Left 12/03/2020   Procedure: LEFT TOTAL KNEE ARTHROPLASTY;  Surgeon: Mcarthur Rossetti, MD;  Location: [facility];  Service: Orthopedics;  Laterality: Left;    There were no vitals filed for this visit.   Subjective Assessment - 01/20/21 1319    Subjective Feeling not too bad. Was able to walk around the house with the [facility] all day yesterday.    Patient is accompained by: Family member    Pertinent History HTN, arthritis, T2DM, history of heart attack with stents placed in 2009    How long can you walk comfortably? short household distances, increased pain with functional mobility in the house. most limited by pain.    Patient Stated Goals to decrease pain, improve strength, improve stability.    Currently in Pain? Yes    Pain Score 7     Pain Location Knee    Pain Orientation Left;Lateral    Pain Descriptors / Indicators Sharp    Pain Type Acute pain                             [facility] Adult PT Treatment/Exercise - 01/20/21 0001      Ambulation/Gait   Stairs Yes    Stairs Assistance 4: Min guard    Stair Management Technique One rail Left;Step to pattern;Forwards;With cane    Number of Stairs 13    Height of Stairs 8    Gait Comments alternating step-to pattern ascending, R LE dominnant step to pattern descending; good stability   cueing for proper SPC sequencing and HR support for safety     Knee/Hip Exercises: Stretches   Other Knee/Hip Stretches L knee flexion stretch on 9" step 10x5"    Other Knee/Hip Stretches R modified ER stretch with pillow under knee + forward fold to tolerance 2x30"   good tolerance     Knee/Hip Exercises: Aerobic   Nustep L4x76mn (UEs/LEs)      Knee/Hip Exercises: Standing   Heel Raises Both;1 set;10 reps    Heel Raises Limitations at counter    Functional Squat 1 set;10 reps    Functional Squat Limitations at counter   manual cues to shift bottom back     Vasopneumatic   Number Minutes Vasopneumatic   10 minutes    Vasopnuematic Location  Knee   L   Vasopneumatic Pressure Low    Vasopneumatic Temperature  34                  PT Education - 01/20/21 1355    Education Details update to HEP; edu on proper SPC sequencing    Person(s) Educated Patient    Methods Explanation;Demonstration;Tactile cues;Verbal cues;Handout    Comprehension Verbalized understanding;Returned demonstration            PT Short Term Goals - 01/11/21 1447      PT SHORT TERM GOAL #1   Title Independent with initial HEP    Time 2    Period Weeks    Status Achieved    Target Date 01/12/21  PT Long Term Goals - 01/11/21 1447      PT LONG TERM GOAL #1   Title Independent with ongoing HEP    Time 8    Period Weeks    Status Partially Met   met for current     PT LONG TERM GOAL #2   Title R knee AROM >/= 3-115 dg to allow for normal gait and stair pattern    Time 8    Period Weeks    Status On-going   AROM 10-87 degrees, PROM 8-90 degrees     PT LONG TERM GOAL #3   Title B LE strength >/= 4+/5 for improved stability    Time 8    Period Weeks    Status Partially Met   limited in L knee flexion, extension     PT LONG TERM GOAL #4   Title Patient will ambulate with normal gait pattern w/o AD to increase ease of community access    Time 8    Period Weeks    Status On-going   gait deviations still present, requiring cues to correct     PT LONG TERM GOAL #5   Title Patient will ascend/descend 1 flight of stairs with good reciprocal pattern    Time 8    Period Weeks    Status On-going   NT                Plan - 01/20/21 1356    Clinical Impression Statement Patient arrived to session with report of ambulating with SPC in the home all day yesterday. Still noting L lateral knee pain as biggest concern today. Worked on stairs with SPC, with patient demonstrating step-to pattern throughout. Patient required cueing for safe handrail use and SPC sequencing. Trialed  lateral hip stretching again this session d/t patient's c/o pain, with patient demonstrating much improved tolerance today. Worked on standing L knee stretching and strengthening with patient requiring cues to avoid anterior translation of tibia with squat. Ended session with Gameready to L knee to address post-exercise soreness. Patient without complaints at end of session.    Comorbidities HTN, arthritis, T2DM, history of heart attack with stents placed in 2009, previously had R TKA    PT Frequency 2x / week    PT Duration 8 weeks    PT Treatment/Interventions ADLs/Self Care Home Management;Vasopneumatic Device;Cryotherapy;Electrical Stimulation;Iontophoresis 49m/ml Dexamethasone;Neuromuscular re-education;Balance training;Therapeutic exercise;Therapeutic activities;Functional mobility training;Stair training;Gait training;Patient/family education;Manual techniques;Joint Manipulations;Taping;Scar mobilization    PT Next Visit Plan Progress TE, gait training  as tolerated. Manual and modalities as needed for mobility and pain mgmt    PT Home Exercise Plan Access Code: [email or web address]. Supine Heel Slide with Strap , Seated Heel Slide , Supine Quadricep Sets, Seated Hamstring Stretch    Consulted and Agree with Plan of Care Patient           Patient will benefit from skilled therapeutic intervention in order to improve the following deficits and impairments:  Abnormal gait,Decreased range of motion,Difficulty walking,Increased fascial restricitons,Increased muscle spasms,Decreased endurance,Decreased activity tolerance,Pain,Improper body mechanics,Impaired flexibility,Decreased balance,Decreased mobility,Decreased strength,Increased edema,Postural dysfunction  Visit Diagnosis: Acute pain of left knee  Other abnormalities of gait and mobility  Difficulty in walking, not elsewhere classified  Localized edema  Muscle weakness (generalized)     Problem List Patient Active Problem List   Diagnosis  Date Noted  . History of kidney stones   . 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 Arthritis of left knee 12/02/2020  .  Renal insufficiency 08/12/2020  . Ischemic cardiomyopathy 08/12/2020  . CAD (coronary artery disease)   . DM (diabetes mellitus) ([place])   . HTN (hypertension)   . Hyperlipidemia   . Diabetes mellitus due to underlying condition with unspecified complications ([place]) 60/63/0160  . Paroxysmal atrial fibrillation ([place]) 07/16/2020  . Erythrocytosis 06/01/2020  . Unilateral primary osteoarthritis, right knee 11/15/2018  . 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 Depressive disorder 11/12/2018  . 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 History of colonic polyps 11/12/2018  . History of nephrolithiasis 11/12/2018  . History of smoking 11/12/2018  . 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 Medication intolerance 11/12/2018  . Old myocardial infarction 11/12/2018  . Mixed hyperlipidemia 11/12/2018  . Pre-operative cardiovascular examination 11/12/2018  . CAD (coronary artery disease), native coronary artery 11/12/2018  . Bilateral leg edema 05/04/2017  . Primary osteoarthritis of both knees 03/05/2017  . CAP (community acquired pneumonia) 08/17/2016  . Right carotid bruit 08/05/2014  . HYPERTENSION, BENIGN 06/15/2009  . CAD, NATIVE VESSEL 06/15/2009  . Type II or unspecified type diabetes mellitus without mention of complication, not stated as uncontrolled 10/14/2007  . Gout, unspecified 10/14/2007  . Essential hypertension 10/14/2007      Janene Harvey, PT, DPT 01/20/21 2:21 PM   [facility] [address]  [place], [place], [address] Phone: [phone]   Fax:  [phone]  Name: JARETT DRALLE MRN: [id number] Date of Birth: 07-07-1951

## 2021-01-25 ENCOUNTER — Other Ambulatory Visit: Payer: Self-pay

## 2021-01-25 ENCOUNTER — Encounter: Payer: Self-pay | Admitting: Physical Therapy

## 2021-01-25 ENCOUNTER — Ambulatory Visit: Payer: Medicare Other | Admitting: Physical Therapy

## 2021-01-25 DIAGNOSIS — R262 Difficulty in walking, not elsewhere classified: Secondary | ICD-10-CM | POA: Diagnosis not present

## 2021-01-25 DIAGNOSIS — R600 Localized edema: Secondary | ICD-10-CM | POA: Diagnosis not present

## 2021-01-25 DIAGNOSIS — R2689 Other abnormalities of gait and mobility: Secondary | ICD-10-CM | POA: Diagnosis not present

## 2021-01-25 DIAGNOSIS — M25562 Pain in left knee: Secondary | ICD-10-CM | POA: Diagnosis not present

## 2021-01-25 DIAGNOSIS — M6281 Muscle weakness (generalized): Secondary | ICD-10-CM | POA: Diagnosis not present

## 2021-01-25 DIAGNOSIS — M25662 Stiffness of left knee, not elsewhere classified: Secondary | ICD-10-CM | POA: Diagnosis not present

## 2021-01-25 NOTE — Therapy (Signed)
[facility] [address]  [place], [place], [address] Phone: [phone]   Fax:  [phone]  Physical Therapy Treatment  Patient Details  Name: [name] [name] MRN: [id number] Date of Birth: Sep 26, 1951 Referring Provider (PT): Ninfa Linden   Encounter Date: 01/25/2021   PT End of Session - 01/25/21 1355    Visit Number 8    Number of Visits 17    Date for PT Re-Evaluation 02/23/21    PT Start Time 1316    PT Stop Time 1409    PT Time Calculation (min) 53 min    Equipment Utilized During Treatment --    Activity Tolerance Patient tolerated treatment well;Patient limited by pain    Behavior During Therapy [facility] for tasks assessed/performed           Past Medical History:  Diagnosis Date  . Arthritis of left knee 12/02/2020  . Bilateral leg edema 05/04/2017  . 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 CAD (coronary artery disease), native coronary artery 11/12/2018   Formatting of this note might be different from the original. S/p MI - Dr. Maurene Capes  . CAD, NATIVE VESSEL 06/15/2009   Qualifier: Diagnosis of  By: Haroldine Laws, MD, Eileen Stanford CAP (community acquired pneumonia) 08/17/2016  . Depressive disorder 11/12/2018  . Diabetes mellitus due to underlying condition with unspecified complications ([place]) 9/73/5329  . DM (diabetes mellitus) ([place])   . 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 Erythrocytosis 06/01/2020  . Essential hypertension 10/14/2007   Formatting of this note might be different from the original. Hypertension  10/1 IMO update  . Gout, unspecified 10/14/2007   Overview:  Gout  10/1 IMO update  Formatting of this note might be different from the original. Gout  10/1 IMO update  . History of colonic polyps 11/12/2018   Overview:  rpt colonoscopy 5/17; Dr. Haig Prophet of this note might be  different from the original. rpt colonoscopy 5/17; Dr. Erlene Quan  . History of kidney stones   . History of nephrolithiasis 11/12/2018  . History of smoking 11/12/2018   Overview:  quit 2000  Formatting of this note might be different from the original. quit 2000  . HTN (hypertension)   . Hyperlipidemia   . HYPERLIPIDEMIA-MIXED 10/14/2007   Qualifier: Diagnosis of  By: Haroldine Laws, MD, Willette Cluster of this note might be different from the original. Hyperlipidemia  . HYPERTENSION, BENIGN 06/15/2009  . 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 Ischemic cardiomyopathy 08/12/2020  . Medication intolerance 11/12/2018  . Mixed hyperlipidemia 11/12/2018  . Old myocardial infarction 11/12/2018  . Paroxysmal atrial fibrillation ([place]) 07/16/2020  . Pre-operative cardiovascular examination 11/12/2018  . Primary osteoarthritis of both knees 03/05/2017   Considering Zilretta injections. Good improvement with corticosteroid injections however short-lived. Mild improvement with Visco supplementation.  . Renal insufficiency 08/12/2020   pt denies  . Right carotid bruit 08/05/2014  . 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 Type II or unspecified type diabetes mellitus without mention of complication, not stated as uncontrolled 10/14/2007   Formatting of this note might be different from the original. Diabetes Mellitus  . Unilateral primary osteoarthritis, right knee 11/15/2018  Past Surgical History:  Procedure Laterality Date  . BILATERAL KNEE ARTHROSCOPY    . CORONARY ANGIOPLASTY WITH STENT PLACEMENT    . LAPAROSCOPIC INGUINAL HERNIA REPAIR    . NOSE SURGERY    . TOTAL KNEE ARTHROPLASTY Right 11/15/2018   Procedure: RIGHT TOTAL KNEE ARTHROPLASTY;  Surgeon: Mcarthur Rossetti, MD;  Location: [facility];  Service: Orthopedics;  Laterality: Right;  . TOTAL  KNEE ARTHROPLASTY Left 12/03/2020   Procedure: LEFT TOTAL KNEE ARTHROPLASTY;  Surgeon: Mcarthur Rossetti, MD;  Location: [facility];  Service: Orthopedics;  Laterality: Left;    There were no vitals filed for this visit.   Subjective Assessment - 01/25/21 1319    Subjective Been using the SPC around the house and going up/down the stairs- this has been going well. Lateral knee pain is about the same.    Patient is accompained by: Family member   son   Pertinent History HTN, arthritis, T2DM, history of heart attack with stents placed in 2009    How long can you walk comfortably? short household distances, increased pain with functional mobility in the house. most limited by pain.    Patient Stated Goals to decrease pain, improve strength, improve stability.    Currently in Pain? Yes    Pain Score 8     Pain Location Knee    Pain Orientation Left    Pain Descriptors / Indicators Sharp    Pain Type Acute pain              [facility] PT Assessment - 01/25/21 0001      PROM   Left Knee Flexion 97   AAROM after MT                                                                        [facility] Adult PT Treatment/Exercise - 01/25/21 0001      Ambulation/Gait   Ambulation Distance (Feet) --    Assistive device --    Gait Pattern --    Ambulation Surface --    Gait Comments --      Knee/Hip Exercises: Stretches   Passive Hamstring Stretch Left;2 reps;30 seconds    Passive Hamstring Stretch Limitations supine with strap      Knee/Hip Exercises: Aerobic   Stationary Bike partial revolutions for ROM x6 min; additional 3 min partial dev after MT for ROM      Knee/Hip Exercises: Standing   Functional Squat 1 set;10 reps    Functional Squat Limitations at TM rail with L foot back   to chair with airex + pillow   Other Standing Knee Exercises L knee flexion stretch on TM 10x5" to tolerance   able to reach 100 deg     Knee/Hip Exercises: Supine   Quad Sets  Strengthening;Left;1 set;5 reps    Quad Sets Limitations 5x5" with 1/2 bolster under knee    Heel Slides AAROM;Left;1 set;10 reps    Heel Slides Limitations 10x3 sec wiht peanut ball      Vasopneumatic   Number Minutes Vasopneumatic  10 minutes    Vasopnuematic Location  Knee   L   Vasopneumatic Pressure Low    Vasopneumatic Temperature  34      Manual Therapy  Manual Therapy Soft tissue mobilization;Joint mobilization    Manual therapy comments supine    Joint Mobilization L patellar mobs in all direction grade 3/4; L knee flexion PA on distal tibia mobs grade IV followed by knee flexion AAROM holds to tolerance    Soft tissue mobilization scar mobilization to L knee   good tolerance; avoided unhealed section toward the inferior portion of scar                PT Short Term Goals - 01/11/21 1447      PT SHORT TERM GOAL #1   Title Independent with initial HEP    Time 2    Period Weeks    Status Achieved    Target Date 01/12/21             PT Long Term Goals - 01/11/21 1447      PT LONG TERM GOAL #1   Title Independent with ongoing HEP    Time 8    Period Weeks    Status Partially Met   met for current     PT LONG TERM GOAL #2   Title R knee AROM >/= 3-115 dg to allow for normal gait and stair pattern    Time 8    Period Weeks    Status On-going   AROM 10-87 degrees, PROM 8-90 degrees     PT LONG TERM GOAL #3   Title B LE strength >/= 4+/5 for improved stability    Time 8    Period Weeks    Status Partially Met   limited in L knee flexion, extension     PT LONG TERM GOAL #4   Title Patient will ambulate with normal gait pattern w/o AD to increase ease of community access    Time 8    Period Weeks    Status On-going   gait deviations still present, requiring cues to correct     PT LONG TERM GOAL #5   Title Patient will ascend/descend 1 flight of stairs with good reciprocal pattern    Time 8    Period Weeks    Status On-going   NT                 Plan - 01/25/21 1406    Clinical Impression Statement Patient reporting tolerance for ambulating inside the home and navigating stairs with [facility] without issues. Reports remaining L lateral knee pain is unchanged. Worked on L knee incision scar mobilization and patellar mobs with good tolerance. Proceeded with L tibiofemoral knee flexion mobs to patient's tolerance, followed by AAROM knee flexion stretching. Patient was able to reach 97 degrees of knee flexion AAROM after stretching and MT. Then able to reach 100 degrees with standing stretch. Patient was able to tolerate increased challenge with squats with L weight shift bias. Ended session with Gameready to L knee for post-exercise soreness. No further complaints at end of session.    Comorbidities HTN, arthritis, T2DM, history of heart attack with stents placed in 2009, previously had R TKA    PT Frequency 2x / week    PT Duration 8 weeks    PT Treatment/Interventions ADLs/Self Care Home Management;Vasopneumatic Device;Cryotherapy;Electrical Stimulation;Iontophoresis 76m/ml Dexamethasone;Neuromuscular re-education;Balance training;Therapeutic exercise;Therapeutic activities;Functional mobility training;Stair training;Gait training;Patient/family education;Manual techniques;Joint Manipulations;Taping;Scar mobilization    PT Next Visit Plan Progress TE, gait training  as tolerated. Manual and modalities as needed for mobility and pain mgmt    PT Home Exercise Plan Access Code: [email or web address]. Supine  Heel Slide with Strap , Seated Heel Slide , Supine Quadricep Sets, Seated Hamstring Stretch    Consulted and Agree with Plan of Care Patient           Patient will benefit from skilled therapeutic intervention in order to improve the following deficits and impairments:  Abnormal gait,Decreased range of motion,Difficulty walking,Increased fascial restricitons,Increased muscle spasms,Decreased endurance,Decreased activity tolerance,Pain,Improper  body mechanics,Impaired flexibility,Decreased balance,Decreased mobility,Decreased strength,Increased edema,Postural dysfunction  Visit Diagnosis: Acute pain of left knee  Other abnormalities of gait and mobility  Difficulty in walking, not elsewhere classified  Localized edema  Muscle weakness (generalized)     Problem List Patient Active Problem List   Diagnosis Date Noted  . History of kidney stones   . 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 Arthritis of left knee 12/02/2020  . Renal insufficiency 08/12/2020  . Ischemic cardiomyopathy 08/12/2020  . CAD (coronary artery disease)   . DM (diabetes mellitus) ([place])   . HTN (hypertension)   . Hyperlipidemia   . Diabetes mellitus due to underlying condition with unspecified complications ([place]) 45/14/6047  . Paroxysmal atrial fibrillation ([place]) 07/16/2020  . Erythrocytosis 06/01/2020  . Unilateral primary osteoarthritis, right knee 11/15/2018  . 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 Depressive disorder 11/12/2018  . 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 History of colonic polyps 11/12/2018  . History of nephrolithiasis 11/12/2018  . History of smoking 11/12/2018  . 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 Medication intolerance 11/12/2018  . Old myocardial infarction 11/12/2018  . Mixed hyperlipidemia 11/12/2018  . Pre-operative cardiovascular examination 11/12/2018  . CAD (coronary artery disease), native coronary artery 11/12/2018  . Bilateral leg edema 05/04/2017  . Primary osteoarthritis of both knees 03/05/2017  . CAP (community acquired pneumonia) 08/17/2016  . Right carotid bruit 08/05/2014  . HYPERTENSION, BENIGN 06/15/2009  . CAD, NATIVE VESSEL 06/15/2009  . Type II or unspecified type diabetes mellitus without mention of complication, not stated as uncontrolled 10/14/2007  . Gout, unspecified  10/14/2007  . Essential hypertension 10/14/2007    Janene Harvey, PT, DPT 01/25/21 4:21 PM   [facility] [address]  [place], [place], [address] Phone: [phone]   Fax:  [phone]  Name: [name] [name] MRN: [id number] Date of Birth: 01-15-1951

## 2021-01-27 ENCOUNTER — Encounter: Payer: Self-pay | Admitting: Physical Therapy

## 2021-01-27 ENCOUNTER — Ambulatory Visit: Payer: Medicare Other | Admitting: Physical Therapy

## 2021-01-27 ENCOUNTER — Other Ambulatory Visit: Payer: Self-pay

## 2021-01-27 DIAGNOSIS — M6281 Muscle weakness (generalized): Secondary | ICD-10-CM

## 2021-01-27 DIAGNOSIS — R262 Difficulty in walking, not elsewhere classified: Secondary | ICD-10-CM | POA: Diagnosis not present

## 2021-01-27 DIAGNOSIS — R600 Localized edema: Secondary | ICD-10-CM | POA: Diagnosis not present

## 2021-01-27 DIAGNOSIS — M25662 Stiffness of left knee, not elsewhere classified: Secondary | ICD-10-CM

## 2021-01-27 DIAGNOSIS — R2689 Other abnormalities of gait and mobility: Secondary | ICD-10-CM

## 2021-01-27 DIAGNOSIS — M25562 Pain in left knee: Secondary | ICD-10-CM

## 2021-01-27 NOTE — Therapy (Signed)
[facility] [address]  [place], [place], [address] Phone: [phone]   Fax:  [phone]  Physical Therapy Treatment  Patient Details  Name: [name] [name] MRN: [id number] Date of Birth: June 13, 1951 Referring Provider (PT): Ninfa Linden   Encounter Date: 01/27/2021   PT End of Session - 01/27/21 0630    Visit Number 9    Number of Visits 17    Date for PT Re-Evaluation 02/23/21    PT Start Time 1601    PT Stop Time 1407    PT Time Calculation (min) 49 min    Activity Tolerance Patient tolerated treatment well;Patient limited by pain    Behavior During Therapy [facility] for tasks assessed/performed           Past Medical History:  Diagnosis Date  . Arthritis of left knee 12/02/2020  . Bilateral leg edema 05/04/2017  . 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 CAD (coronary artery disease), native coronary artery 11/12/2018   Formatting of this note might be different from the original. S/p MI - Dr. Maurene Capes  . CAD, NATIVE VESSEL 06/15/2009   Qualifier: Diagnosis of  By: Haroldine Laws, MD, Eileen Stanford CAP (community acquired pneumonia) 08/17/2016  . Depressive disorder 11/12/2018  . Diabetes mellitus due to underlying condition with unspecified complications ([place]) 0/93/2355  . DM (diabetes mellitus) ([place])   . 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 Erythrocytosis 06/01/2020  . Essential hypertension 10/14/2007   Formatting of this note might be different from the original. Hypertension  10/1 IMO update  . Gout, unspecified 10/14/2007   Overview:  Gout  10/1 IMO update  Formatting of this note might be different from the original. Gout  10/1 IMO update  . History of colonic polyps 11/12/2018   Overview:  rpt colonoscopy 5/17; Dr. Haig Prophet of this note might be different from the original. rpt  colonoscopy 5/17; Dr. Erlene Quan  . History of kidney stones   . History of nephrolithiasis 11/12/2018  . History of smoking 11/12/2018   Overview:  quit 2000  Formatting of this note might be different from the original. quit 2000  . HTN (hypertension)   . Hyperlipidemia   . HYPERLIPIDEMIA-MIXED 10/14/2007   Qualifier: Diagnosis of  By: Haroldine Laws, MD, Willette Cluster of this note might be different from the original. Hyperlipidemia  . HYPERTENSION, BENIGN 06/15/2009  . 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 Ischemic cardiomyopathy 08/12/2020  . Medication intolerance 11/12/2018  . Mixed hyperlipidemia 11/12/2018  . Old myocardial infarction 11/12/2018  . Paroxysmal atrial fibrillation ([place]) 07/16/2020  . Pre-operative cardiovascular examination 11/12/2018  . Primary osteoarthritis of both knees 03/05/2017   Considering Zilretta injections. Good improvement with corticosteroid injections however short-lived. Mild improvement with Visco supplementation.  . Renal insufficiency 08/12/2020   pt denies  . Right carotid bruit 08/05/2014  . 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 Type II or unspecified type diabetes mellitus without mention of complication, not stated as uncontrolled 10/14/2007   Formatting of this note might be different from the original. Diabetes Mellitus  . Unilateral primary osteoarthritis, right knee 11/15/2018    Past Surgical History:  Procedure Laterality Date  .  BILATERAL KNEE ARTHROSCOPY    . CORONARY ANGIOPLASTY WITH STENT PLACEMENT    . LAPAROSCOPIC INGUINAL HERNIA REPAIR    . NOSE SURGERY    . TOTAL KNEE ARTHROPLASTY Right 11/15/2018   Procedure: RIGHT TOTAL KNEE ARTHROPLASTY;  Surgeon: Mcarthur Rossetti, MD;  [facility]: [facility];  Service: Orthopedics;  Laterality: Right;  . TOTAL KNEE ARTHROPLASTY Left 12/03/2020    Procedure: LEFT TOTAL KNEE ARTHROPLASTY;  Surgeon: Mcarthur Rossetti, MD;  Location: [facility];  Service: Orthopedics;  Laterality: Left;    There were no vitals filed for this visit.   Subjective Assessment - 01/27/21 1323    Subjective Reports that he "over-did it" yesterday when he used his home bike and did his stretches for his knee. Requesting to "go easy on me" today.    Pertinent History HTN, arthritis, T2DM, history of heart attack with stents placed in 2009    How long can you walk comfortably? short household distances, increased pain with functional mobility in the house. most limited by pain.    Patient Stated Goals to decrease pain, improve strength, improve stability.    Currently in Pain? Yes    Pain Score 9     Pain Location Knee    Pain Orientation Left    Pain Descriptors / Indicators Sharp    Pain Type Acute pain                             [facility] Adult PT Treatment/Exercise - 01/27/21 0001      Knee/Hip Exercises: Stretches   Passive Hamstring Stretch Left;2 reps;30 seconds    Passive Hamstring Stretch Limitations supine with strap    Hip Flexor Stretch Left;2 reps;30 seconds    Hip Flexor Stretch Limitations supine with strap and foot resting on 4" step      Knee/Hip Exercises: Aerobic   Stationary Bike partial revolutions for ROM x6 min      Knee/Hip Exercises: Machines for Strengthening   Cybex Knee Extension B LEs 5x 5#, 2x5 8#, L LE 5x 5#   cues to increase control with eccentric phase; good TKE   Cybex Knee Flexion B LEs 10x 10#; L LE 5x 5#      Knee/Hip Exercises: Supine   Heel Slides AAROM;Left;1 set;10 reps    Heel Slides Limitations 10x3 sec wiht orange ball      Vasopneumatic   Number Minutes Vasopneumatic  10 minutes    Vasopnuematic Location  Knee   L   Vasopneumatic Pressure Low    Vasopneumatic Temperature  34                    PT Short Term Goals - 01/11/21 1447      PT SHORT TERM GOAL #1   Title  Independent with initial HEP    Time 2    Period Weeks    Status Achieved    Target Date 01/12/21             PT Long Term Goals - 01/11/21 1447      PT LONG TERM GOAL #1   Title Independent with ongoing HEP    Time 8    Period Weeks    Status Partially Met   met for current     PT LONG TERM GOAL #2   Title R knee AROM >/= 3-115 dg to allow for normal gait and stair pattern    Time 8  Period Weeks    Status On-going   AROM 10-87 degrees, PROM 8-90 degrees     PT LONG TERM GOAL #3   Title B LE strength >/= 4+/5 for improved stability    Time 8    Period Weeks    Status Partially Met   limited in L knee flexion, extension     PT LONG TERM GOAL #4   Title Patient will ambulate with normal gait pattern w/o AD to increase ease of community access    Time 8    Period Weeks    Status On-going   gait deviations still present, requiring cues to correct     PT LONG TERM GOAL #5   Title Patient will ascend/descend 1 flight of stairs with good reciprocal pattern    Time 8    Period Weeks    Status On-going   NT                Plan - 01/27/21 1400    Clinical Impression Statement Patient arrived to session with report that he over-did it" yesterday when he used his home bike and did his stretches for his knee. Requesting to "go easy on me" today. Worked on LE stretching to address tightness which was evident with HS and quad/hip flexor stretching. Patient preformed knee flexion stretching to tolerance, reaching ~90 degrees today. Initiated machine strengthening at light weight, with patient requiring cues to perform eccentric phase with more control but able to achieve TKE well. Ended session with Gameready to L knee for post exercise soreness. Patient without further complaints at end of session.    Comorbidities HTN, arthritis, T2DM, history of heart attack with stents placed in 2009, previously had R TKA    PT Frequency 2x / week    PT Duration 8 weeks    PT  Treatment/Interventions ADLs/Self Care Home Management;Vasopneumatic Device;Cryotherapy;Electrical Stimulation;Iontophoresis 97m/ml Dexamethasone;Neuromuscular re-education;Balance training;Therapeutic exercise;Therapeutic activities;Functional mobility training;Stair training;Gait training;Patient/family education;Manual techniques;Joint Manipulations;Taping;Scar mobilization    PT Next Visit Plan Progress TE, gait training  as tolerated. Manual and modalities as needed for mobility and pain mgmt    PT Home Exercise Plan Access Code: [email or web address]. Supine Heel Slide with Strap , Seated Heel Slide , Supine Quadricep Sets, Seated Hamstring Stretch    Consulted and Agree with Plan of Care Patient           Patient will benefit from skilled therapeutic intervention in order to improve the following deficits and impairments:  Abnormal gait,Decreased range of motion,Difficulty walking,Increased fascial restricitons,Increased muscle spasms,Decreased endurance,Decreased activity tolerance,Pain,Improper body mechanics,Impaired flexibility,Decreased balance,Decreased mobility,Decreased strength,Increased edema,Postural dysfunction  Visit Diagnosis: Acute pain of left knee  Other abnormalities of gait and mobility  Difficulty in walking, not elsewhere classified  Localized edema  Muscle weakness (generalized)  Stiffness of left knee, not elsewhere classified     Problem List Patient Active Problem List   Diagnosis Date Noted  . History of kidney stones   . 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 Arthritis of left knee 12/02/2020  . Renal insufficiency 08/12/2020  . Ischemic cardiomyopathy 08/12/2020  . CAD (coronary artery disease)   . DM (diabetes mellitus) ([place]   . HTN (hypertension)   . Hyperlipidemia   . Diabetes mellitus due to underlying condition with unspecified complications ([place] 018/29/9371 . Paroxysmal atrial fibrillation ([place] 07/16/2020  . Erythrocytosis  06/01/2020  . Unilateral primary osteoarthritis, right knee 11/15/2018  . 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  Depressive disorder 11/12/2018  . 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 History of colonic polyps 11/12/2018  . History of nephrolithiasis 11/12/2018  . History of smoking 11/12/2018  . 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 Medication intolerance 11/12/2018  . Old myocardial infarction 11/12/2018  . Mixed hyperlipidemia 11/12/2018  . Pre-operative cardiovascular examination 11/12/2018  . CAD (coronary artery disease), native coronary artery 11/12/2018  . Bilateral leg edema 05/04/2017  . Primary osteoarthritis of both knees 03/05/2017  . CAP (community acquired pneumonia) 08/17/2016  . Right carotid bruit 08/05/2014  . HYPERTENSION, BENIGN 06/15/2009  . CAD, NATIVE VESSEL 06/15/2009  . Type II or unspecified type diabetes mellitus without mention of complication, not stated as uncontrolled 10/14/2007  . Gout, unspecified 10/14/2007  . Essential hypertension 10/14/2007     Janene Harvey, PT, DPT 01/27/21 2:10 PM   [facility] [address]  [place], [place], [address] Phone: [phone]   Fax:  [phone]  Name: [name] [name] MRN: [id number] Date of Birth: [birth year]

## 2021-02-01 ENCOUNTER — Encounter: Payer: Self-pay | Admitting: Physical Therapy

## 2021-02-01 ENCOUNTER — Other Ambulatory Visit: Payer: Self-pay

## 2021-02-01 ENCOUNTER — Ambulatory Visit: Payer: Medicare Other | Admitting: Physical Therapy

## 2021-02-01 DIAGNOSIS — M25562 Pain in left knee: Secondary | ICD-10-CM

## 2021-02-01 DIAGNOSIS — R600 Localized edema: Secondary | ICD-10-CM

## 2021-02-01 DIAGNOSIS — M6281 Muscle weakness (generalized): Secondary | ICD-10-CM | POA: Diagnosis not present

## 2021-02-01 DIAGNOSIS — M25662 Stiffness of left knee, not elsewhere classified: Secondary | ICD-10-CM | POA: Diagnosis not present

## 2021-02-01 DIAGNOSIS — R2689 Other abnormalities of gait and mobility: Secondary | ICD-10-CM

## 2021-02-01 DIAGNOSIS — R262 Difficulty in walking, not elsewhere classified: Secondary | ICD-10-CM | POA: Diagnosis not present

## 2021-02-01 NOTE — Therapy (Addendum)
[facility] [address]  [place], [place], [address] Phone: [phone]   Fax:  [phone]  Physical Therapy Progress Note  Patient Details  Name: [name] [name] MRN: [id number] Date of Birth: 12-22-1950 Referring Provider (PT): Ninfa Linden   Progress Note Reporting Period 12/28/20 to 02/01/21  See note below for Objective Data and Assessment of Progress/Goals.     Encounter Date: 02/01/2021   PT End of Session - 02/01/21 1356    Visit Number 10    Number of Visits 17    Date for PT Re-Evaluation 02/23/21    PT Start Time 1307    PT Stop Time 1404    PT Time Calculation (min) 57 min    Equipment Utilized During Treatment Gait belt    Activity Tolerance Patient tolerated treatment well;Patient limited by pain    Behavior During Therapy WFL for tasks assessed/performed           Past Medical History:  Diagnosis Date  . Arthritis of left knee 12/02/2020  . Bilateral leg edema 05/04/2017  . 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 CAD (coronary artery disease), native coronary artery 11/12/2018   Formatting of this note might be different from the original. S/p MI - Dr. Maurene Capes  . CAD, NATIVE VESSEL 06/15/2009   Qualifier: Diagnosis of  By: Haroldine Laws, MD, Eileen Stanford CAP (community acquired pneumonia) 08/17/2016  . Depressive disorder 11/12/2018  . Diabetes mellitus due to underlying condition with unspecified complications ([place]) 02/11/4981  . DM (diabetes mellitus) ([place])   . 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 Erythrocytosis 06/01/2020  . Essential hypertension 10/14/2007   Formatting of this note might be different from the original. Hypertension  10/1 IMO update  . Gout, unspecified 10/14/2007   Overview:  Gout  10/1 IMO update  Formatting of this note might be different from the  original. Gout  10/1 IMO update  . History of colonic polyps 11/12/2018   Overview:  rpt colonoscopy 5/17; Dr. Haig Prophet of this note might be different from the original. rpt colonoscopy 5/17; Dr. Erlene Quan  . History of kidney stones   . History of nephrolithiasis 11/12/2018  . History of smoking 11/12/2018   Overview:  quit 2000  Formatting of this note might be different from the original. quit 2000  . HTN (hypertension)   . Hyperlipidemia   . HYPERLIPIDEMIA-MIXED 10/14/2007   Qualifier: Diagnosis of  By: Haroldine Laws, MD, Willette Cluster of this note might be different from the original. Hyperlipidemia  . HYPERTENSION, BENIGN 06/15/2009  . 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 Ischemic cardiomyopathy 08/12/2020  . Medication intolerance 11/12/2018  . Mixed hyperlipidemia 11/12/2018  . Old myocardial infarction 11/12/2018  . Paroxysmal atrial fibrillation ([place]) 07/16/2020  . Pre-operative cardiovascular examination 11/12/2018  . Primary osteoarthritis of both knees 03/05/2017   Considering Zilretta injections. Good improvement with corticosteroid injections however short-lived. Mild improvement with Visco supplementation.  . Renal insufficiency 08/12/2020   pt denies  . Right carotid bruit 08/05/2014  . 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 Type II or unspecified type diabetes mellitus without mention of complication, not stated as uncontrolled 10/14/2007  Formatting of this note might be different from the original. Diabetes Mellitus  . Unilateral primary osteoarthritis, right knee 11/15/2018    Past Surgical History:  Procedure Laterality Date  . BILATERAL KNEE ARTHROSCOPY    . CORONARY ANGIOPLASTY WITH STENT PLACEMENT    . LAPAROSCOPIC INGUINAL HERNIA REPAIR    . NOSE SURGERY    . TOTAL KNEE ARTHROPLASTY Right 11/15/2018    Procedure: RIGHT TOTAL KNEE ARTHROPLASTY;  Surgeon: Mcarthur Rossetti, MD;  Location: [facility];  Service: Orthopedics;  Laterality: Right;  . TOTAL KNEE ARTHROPLASTY Left 12/03/2020   Procedure: LEFT TOTAL KNEE ARTHROPLASTY;  Surgeon: Mcarthur Rossetti, MD;  Location: [facility];  Service: Orthopedics;  Laterality: Left;    There were no vitals filed for this visit.   Subjective Assessment - 02/01/21 1309    Subjective Reports that he is still having lateral knee pain- taking pain meds at night. Reports 50-60% improvement in the L knee. Reports that he feels that he is doing well with his PT progress- noting improvement in ROM and would like to continue working on this.    Pertinent History HTN, arthritis, T2DM, history of heart attack with stents placed in 2009    How long can you walk comfortably? short household distances, increased pain with functional mobility in the house. most limited by pain.    Patient Stated Goals to decrease pain, improve strength, improve stability.    Currently in Pain? Yes    Pain Score 7     Pain Location Knee    Pain Orientation Left    Pain Descriptors / Indicators Sharp    Pain Type Acute pain              [facility] PT Assessment - 02/01/21 0001      Assessment   Medical Diagnosis L TKA 12/03/20    Referring Provider (PT) Ninfa Linden    Onset Date/Surgical Date 12/03/20      Observation/Other Assessments   Focus on Therapeutic Outcomes (FOTO)  Knee: 47      AROM   Left Knee Extension 3    Left Knee Flexion 95      PROM   Left Knee Extension 2    Left Knee Flexion 99      Strength   Right Hip Flexion 4+/5    Right Hip ABduction 4+/5    Right Hip ADduction 4+/5    Left Hip Flexion 4+/5    Left Hip ABduction 4+/5    Left Hip ADduction 4+/5    Right Knee Flexion 4+/5    Right Knee Extension 4+/5    Left Knee Flexion 4+/5   lateral knee pain   Left Knee Extension 4+/5   lateral knee pain   Right Ankle Dorsiflexion 4+/5    Right Ankle  Plantar Flexion 4+/5    Right Ankle Inversion 4+/5    Right Ankle Eversion 4+/5    Left Ankle Dorsiflexion 4+/5    Left Ankle Plantar Flexion 4+/5    Left Ankle Inversion 4+/5    Left Ankle Eversion 4+/5                         [facility] Adult PT Treatment/Exercise - 02/01/21 0001      Ambulation/Gait   Ambulation Distance (Feet) 100 Feet    Assistive device Straight cane    Gait Pattern Decreased arm swing - right;Step-through pattern;Decreased step length - left;Decreased stance time - left;Decreased stride length;Decreased weight  shift to left;Poor foot clearance - left;Trunk flexed    Ambulation Surface Level;Indoor    Stairs Yes    Stairs Assistance 4: Min guard    Stair Management Technique One rail Left;Step to pattern;Forwards;With cane;Alternating pattern    Number of Stairs 13    Height of Stairs 8    Gait Comments alternating reciprocal pattern ascending, R LE dominnant & L LE dominant step to pattern descending; pt with c/o L knee pain descending on L foot and c/o hesitancy      Knee/Hip Exercises: Aerobic   Stationary Bike partial revolutions for ROM x6 min      Knee/Hip Exercises: Standing   Other Standing Knee Exercises L knee flexion stretch on TM 10x5" to tolerance   able to reach 101 deg     Knee/Hip Exercises: Supine   Quad Sets Strengthening;Left;1 set;5 reps    Quad Sets Limitations 5x5"      Vasopneumatic   Number Minutes Vasopneumatic  10 minutes    Vasopnuematic Location  Knee   L   Vasopneumatic Pressure Low    Vasopneumatic Temperature  34      Manual Therapy   Manual Therapy Joint mobilization    Manual therapy comments supine    Joint Mobilization L knee flexion PAs on distal tibia mobs grade IV followed by knee flexion AROM holds to tolerance                  PT Education - 02/01/21 1355    Education Details discussion on objective progress and remaining impairments; advised patient to wean off walker and onto [facility] d/t  improved stability with cane    Person(s) Educated Patient    Methods Explanation;Demonstration;Tactile cues;Verbal cues    Comprehension Verbalized understanding            PT Short Term Goals - 02/01/21 1400      PT SHORT TERM GOAL #1   Title Independent with initial HEP    Time 2    Period Weeks    Status Achieved    Target Date 01/12/21             PT Long Term Goals - 02/01/21 1400      PT LONG TERM GOAL #1   Title Independent with ongoing HEP    Time 8    Period Weeks    Status Partially Met   met for current     PT LONG TERM GOAL #2   Title R knee AROM >/= 3-115 dg to allow for normal gait and stair pattern    Time 8    Period Weeks    Status On-going   AROM is now reaching 3-95 degrees and PROM 2-99 degrees     PT LONG TERM GOAL #3   Title B LE strength >/= 4+/5 for improved stability    Time 8    Period Weeks    Status Achieved   limited in L knee flexion, extension     PT LONG TERM GOAL #4   Title Patient will ambulate with normal gait pattern w/o AD to increase ease of community access    Time 8    Period Weeks    Status On-going   demosntrated decreased L knee flexion during swing phase     PT LONG TERM GOAL #5   Title Patient will ascend/descend 1 flight of stairs with good reciprocal pattern    Time 8    Period Weeks  Status Partially Met   demonstrating reciprocal alternating pattern ascending, step to pattern descending                Plan - 02/01/21 1356    Clinical Impression Statement Patient arrived to session with report of 50-60% improvement in the L knee. Notes that he was able to take a shower for the first time since his surgery this past week. Still notes remaining lateral knee pain, however is only taking pain meds at night. Strength goal has been met. L knee AROM is now reaching 3-95 degrees and PROM 2-99 degrees after stretching and manual therapy. Patient's gait pattern demonstrates good stability when ambulating with  SPC, but still lacking L knee flexion during swing phase. Able to navigate stairs with reciprocal alternating pattern ascending and step-to pattern when descending today. Patienyt limited by knee pain and hesitancy when descending. Patient at this time is demonstrating slow but steady progress towards goals. Encouraged patient to wean off of RW and into [facility] with ambulation d/t improved stability with cane. Ended session with [place] to L knee at end of session. Patient reported understanding of all edu provided today and without complaints at end of session.    Comorbidities HTN, arthritis, T2DM, history of heart attack with stents placed in 2009, previously had R TKA    PT Frequency 2x / week    PT Duration 8 weeks    PT Treatment/Interventions ADLs/Self Care Home Management;Vasopneumatic Device;Cryotherapy;Electrical Stimulation;Iontophoresis 13m/ml Dexamethasone;Neuromuscular re-education;Balance training;Therapeutic exercise;Therapeutic activities;Functional mobility training;Stair training;Gait training;Patient/family education;Manual techniques;Joint Manipulations;Taping;Scar mobilization    PT Next Visit Plan Progress TE, gait training  as tolerated. Manual and modalities as needed for mobility and pain mgmt    PT Home Exercise Plan Access Code: [email or web address]. Supine Heel Slide with Strap , Seated Heel Slide , Supine Quadricep Sets, Seated Hamstring Stretch    Consulted and Agree with Plan of Care Patient           Patient will benefit from skilled therapeutic intervention in order to improve the following deficits and impairments:  Abnormal gait,Decreased range of motion,Difficulty walking,Increased fascial restricitons,Increased muscle spasms,Decreased endurance,Decreased activity tolerance,Pain,Improper body mechanics,Impaired flexibility,Decreased balance,Decreased mobility,Decreased strength,Increased edema,Postural dysfunction  Visit Diagnosis: Acute pain of left knee  Other  abnormalities of gait and mobility  Difficulty in walking, not elsewhere classified  Localized edema  Muscle weakness (generalized)     Problem List Patient Active Problem List   Diagnosis Date Noted  . History of kidney stones   . 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 Arthritis of left knee 12/02/2020  . Renal insufficiency 08/12/2020  . Ischemic cardiomyopathy 08/12/2020  . CAD (coronary artery disease)   . DM (diabetes mellitus) ([place]   . HTN (hypertension)   . Hyperlipidemia   . Diabetes mellitus due to underlying condition with unspecified complications ([place] 034/19/6222 . Paroxysmal atrial fibrillation ([place] 07/16/2020  . Erythrocytosis 06/01/2020  . Unilateral primary osteoarthritis, right knee 11/15/2018  . 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 Depressive disorder 11/12/2018  . 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 History of colonic polyps 11/12/2018  . History of nephrolithiasis 11/12/2018  . History of smoking 11/12/2018  . 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 Medication intolerance 11/12/2018  . Old myocardial infarction 11/12/2018  . Mixed hyperlipidemia 11/12/2018  . Pre-operative cardiovascular examination 11/12/2018  . CAD (coronary artery disease), native coronary artery 11/12/2018  .  Bilateral leg edema 05/04/2017  . Primary osteoarthritis of both knees 03/05/2017  . CAP (community acquired pneumonia) 08/17/2016  . Right carotid bruit 08/05/2014  . HYPERTENSION, BENIGN 06/15/2009  . CAD, NATIVE VESSEL 06/15/2009  . Type II or unspecified type diabetes mellitus without mention of complication, not stated as uncontrolled 10/14/2007  . Gout, unspecified 10/14/2007  . Essential hypertension 10/14/2007     Janene Harvey, PT, DPT 02/01/21 3:29 PM   [facility] [address]  [place], [place], [address] Phone: [phone]   Fax:  [phone]  Name: [name] [name] MRN: [id number] Date of Birth: 08/04/1951

## 2021-02-02 ENCOUNTER — Telehealth: Payer: Self-pay | Admitting: *Deleted

## 2021-02-02 NOTE — Telephone Encounter (Signed)
Patient called and is two months s/p knee replacement. States he is having some sharp, nerve type pain at side of knee that stills is bothersome. Attending OPPT and doing well with this. Takes 1/2 Tramadol as needed. He asked about topical pain medication like a lidocaine (he mentioned by name). I asked if he had taken the Neurontin that Dr. Ninfa Linden previously prescribed along with Flexeril. He mentioned before that these made him very " loopy" together and I agreed that it was most likely the Flexeril along with the Dilaudid prescribed. Would you be ok with him starting the Neurontin again 100mg  tid for this nerve type pain he is describing. I think he has some in the home still OR is there anything topical (as this is what he originally called and asked about).Thanks.

## 2021-02-02 NOTE — Telephone Encounter (Signed)
Neurontin would be best to try again

## 2021-02-03 ENCOUNTER — Other Ambulatory Visit: Payer: Self-pay | Admitting: Physician Assistant

## 2021-02-03 ENCOUNTER — Telehealth: Payer: Self-pay | Admitting: *Deleted

## 2021-02-03 ENCOUNTER — Ambulatory Visit: Payer: Medicare Other | Admitting: Physical Therapy

## 2021-02-03 MED ORDER — LIDOCAINE 5 % EX PTCH: 1.0000 | MEDICATED_PATCH | CUTANEOUS | 0 refills | Status: DC

## 2021-02-03 NOTE — Progress Notes (Unsigned)
lo

## 2021-02-03 NOTE — Telephone Encounter (Signed)
Spoke with patient, who tried the Neurontin again and took before bed. Took 1 tablet and has not been able to function all day today due to sleepiness. He did take with 1/2 Tramadol as well. I didn't have any other recommendations, just said I'd let you and Dr. Ninfa Linden know.

## 2021-02-03 NOTE — Telephone Encounter (Signed)
Stopped Neurontin. Lidoderm patches

## 2021-02-04 NOTE — Telephone Encounter (Signed)
[name] [name] did you talk to the pt. Or do I need to advise him of change?

## 2021-02-07 NOTE — Telephone Encounter (Signed)
I SPOKE WITH HIM

## 2021-02-08 ENCOUNTER — Encounter: Payer: Self-pay | Admitting: Physical Therapy

## 2021-02-08 ENCOUNTER — Other Ambulatory Visit: Payer: Self-pay

## 2021-02-08 ENCOUNTER — Ambulatory Visit: Payer: Medicare Other | Admitting: Physical Therapy

## 2021-02-08 DIAGNOSIS — R2689 Other abnormalities of gait and mobility: Secondary | ICD-10-CM | POA: Diagnosis not present

## 2021-02-08 DIAGNOSIS — M25662 Stiffness of left knee, not elsewhere classified: Secondary | ICD-10-CM

## 2021-02-08 DIAGNOSIS — R262 Difficulty in walking, not elsewhere classified: Secondary | ICD-10-CM | POA: Diagnosis not present

## 2021-02-08 DIAGNOSIS — R600 Localized edema: Secondary | ICD-10-CM

## 2021-02-08 DIAGNOSIS — M6281 Muscle weakness (generalized): Secondary | ICD-10-CM | POA: Diagnosis not present

## 2021-02-08 DIAGNOSIS — M25562 Pain in left knee: Secondary | ICD-10-CM

## 2021-02-08 NOTE — Therapy (Signed)
[facility] [place] [address]  [place], [place], [address] Phone: [phone]   Fax:  [phone]  Physical Therapy Treatment  Patient Details  Name: [name] [name] MRN: [id number] Date of Birth: January 24, 1951 Referring Provider (PT): Ninfa Linden   Encounter Date: 02/08/2021   PT End of Session - 02/08/21 1405    Visit Number 11    Number of Visits 17    Date for PT Re-Evaluation 02/23/21    PT Start Time 1314    PT Stop Time 1409    PT Time Calculation (min) 55 min    Activity Tolerance Patient limited by pain    Behavior During Therapy [facility] for tasks assessed/performed           Past Medical History:  Diagnosis Date  . Arthritis of left knee 12/02/2020  . Bilateral leg edema 05/04/2017  . 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 CAD (coronary artery disease), native coronary artery 11/12/2018   Formatting of this note might be different from the original. S/p MI - Dr. Maurene Capes  . CAD, NATIVE VESSEL 06/15/2009   Qualifier: Diagnosis of  By: Haroldine Laws, MD, Eileen Stanford CAP (community acquired pneumonia) 08/17/2016  . Depressive disorder 11/12/2018  . Diabetes mellitus due to underlying condition with unspecified complications ([place]) 8/45/3646  . DM (diabetes mellitus) ([place])   . 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 Erythrocytosis 06/01/2020  . Essential hypertension 10/14/2007   Formatting of this note might be different from the original. Hypertension  10/1 IMO update  . Gout, unspecified 10/14/2007   Overview:  Gout  10/1 IMO update  Formatting of this note might be different from the original. Gout  10/1 IMO update  . History of colonic polyps 11/12/2018   Overview:  rpt colonoscopy 5/17; Dr. Haig Prophet of this note might be different from the original. rpt colonoscopy 5/17; Dr. Erlene Quan  . History  of kidney stones   . History of nephrolithiasis 11/12/2018  . History of smoking 11/12/2018   Overview:  quit 2000  Formatting of this note might be different from the original. quit 2000  . HTN (hypertension)   . Hyperlipidemia   . HYPERLIPIDEMIA-MIXED 10/14/2007   Qualifier: Diagnosis of  By: Haroldine Laws, MD, Willette Cluster of this note might be different from the original. Hyperlipidemia  . HYPERTENSION, BENIGN 06/15/2009  . 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 Ischemic cardiomyopathy 08/12/2020  . Medication intolerance 11/12/2018  . Mixed hyperlipidemia 11/12/2018  . Old myocardial infarction 11/12/2018  . Paroxysmal atrial fibrillation ([place]) 07/16/2020  . Pre-operative cardiovascular examination 11/12/2018  . Primary osteoarthritis of both knees 03/05/2017   Considering Zilretta injections. Good improvement with corticosteroid injections however short-lived. Mild improvement with Visco supplementation.  . Renal insufficiency 08/12/2020   pt denies  . Right carotid bruit 08/05/2014  . 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 Type II or unspecified type diabetes mellitus without mention of complication, not stated as uncontrolled 10/14/2007   Formatting of this note might be different from the original. Diabetes Mellitus  . Unilateral primary osteoarthritis, right knee 11/15/2018    Past Surgical History:  Procedure Laterality Date  . BILATERAL KNEE  ARTHROSCOPY    . CORONARY ANGIOPLASTY WITH STENT PLACEMENT    . LAPAROSCOPIC INGUINAL HERNIA REPAIR    . NOSE SURGERY    . TOTAL KNEE ARTHROPLASTY Right 11/15/2018   Procedure: RIGHT TOTAL KNEE ARTHROPLASTY;  Surgeon: Mcarthur Rossetti, MD;  Location: [facility];  Service: Orthopedics;  Laterality: Right;  . TOTAL KNEE ARTHROPLASTY Left 12/03/2020   Procedure: LEFT TOTAL KNEE  ARTHROPLASTY;  Surgeon: Mcarthur Rossetti, MD;  Location: [facility];  Service: Orthopedics;  Laterality: Left;    There were no vitals filed for this visit.   Subjective Assessment - 02/08/21 1317    Subjective Admits that he blew the driveway with a handheld blower without using a cane this AM. Has been sitting more than usual too.    Pertinent History HTN, arthritis, T2DM, history of heart attack with stents placed in 2009    How long can you walk comfortably? short household distances, increased pain with functional mobility in the house. most limited by pain.    Patient Stated Goals to decrease pain, improve strength, improve stability.    Currently in Pain? Yes    Pain Score 8     Pain Location Knee    Pain Orientation Left    Pain Descriptors / Indicators Sharp    Pain Type Acute pain                             [facility] Adult PT Treatment/Exercise - 02/08/21 0001      Knee/Hip Exercises: Stretches   Hip Flexor Stretch Left;1 rep;30 seconds    Hip Flexor Stretch Limitations mod thomas with strap    Other Knee/Hip Stretches L knee flexion stretch on 9" step 10x5"      Knee/Hip Exercises: Aerobic   Stationary Bike partial revolutions for ROM x6 min      Knee/Hip Exercises: Standing   Heel Raises Both;1 set;15 reps    Heel Raises Limitations at counter    Knee Flexion Strengthening;Left;1 set;10 reps    Knee Flexion Limitations 3#; limited ROM   unable with 4#   Forward Step Up Left;1 set;5 reps;Hand Hold: 1;Step Height: 6"    Forward Step Up Limitations --   attempted L step up/over but unable to tolerate d/t pain   Functional Squat 1 set;10 reps    Functional Squat Limitations to chair + airex    Other Standing Knee Exercises sidestepping along counter top 4x length of counter   cues for upright chest     Vasopneumatic   Number Minutes Vasopneumatic  15 minutes    Vasopnuematic Location  Knee   L   Vasopneumatic Pressure Low    Vasopneumatic  Temperature  34                  PT Education - 02/08/21 1357    Education Details edu on ionto use, benefits/precautions for possible future use    Person(s) Educated Patient    Methods Explanation;Demonstration;Tactile cues;Verbal cues;Handout    Comprehension Verbalized understanding            PT Short Term Goals - 02/01/21 1400      PT SHORT TERM GOAL #1   Title Independent with initial HEP    Time 2    Period Weeks    Status Achieved    Target Date 01/12/21             PT Long Term Goals -  02/01/21 1400      PT LONG TERM GOAL #1   Title Independent with ongoing HEP    Time 8    Period Weeks    Status Partially Met   met for current     PT LONG TERM GOAL #2   Title R knee AROM >/= 3-115 dg to allow for normal gait and stair pattern    Time 8    Period Weeks    Status On-going   AROM is now reaching 3-95 degrees and PROM 2-99 degrees     PT LONG TERM GOAL #3   Title B LE strength >/= 4+/5 for improved stability    Time 8    Period Weeks    Status Achieved   limited in L knee flexion, extension     PT LONG TERM GOAL #4   Title Patient will ambulate with normal gait pattern w/o AD to increase ease of community access    Time 8    Period Weeks    Status On-going   demosntrated decreased L knee flexion during swing phase     PT LONG TERM GOAL #5   Title Patient will ascend/descend 1 flight of stairs with good reciprocal pattern    Time 8    Period Weeks    Status Partially Met   demonstrating reciprocal alternating pattern ascending, step to pattern descending                Plan - 02/08/21 1406    Clinical Impression Statement Patient arrived to session with slightly increased swelling in the L lower leg after doing some yardwork without using an AD. L calf supple and nontender. Patient denies calf pain and instead localizes pain the L lateral knee. Educated patient on avoiding dependent positioning and advised to continue using at least  a [facility] while outside for the time being. Patient reported understanding. Attempted L step up/over but patient unable to tolerate d/t pain. Opted for step ups only which were slightly better-tolerated. Continued with low intensity standing ther-ex with intermittent sitting/standing rest breaks for pain. Patient with tendency to drop chest forward with standing ther-ex, requiring consistent cueing to correct. Patient demonstrated good effort with squats for max ROM today. Demonstrated limited tolerance for stretching, thus ended session with [employer] to address pain and edema. Patient without further complaints at end of session.    Comorbidities HTN, arthritis, T2DM, history of heart attack with stents placed in 2009, previously had R TKA    PT Frequency 2x / week    PT Duration 8 weeks    PT Treatment/Interventions ADLs/Self Care Home Management;Vasopneumatic Device;Cryotherapy;Electrical Stimulation;Iontophoresis 35m/ml Dexamethasone;Neuromuscular re-education;Balance training;Therapeutic exercise;Therapeutic activities;Functional mobility training;Stair training;Gait training;Patient/family education;Manual techniques;Joint Manipulations;Taping;Scar mobilization    PT Next Visit Plan Progress TE, gait training  as tolerated. Manual and modalities as needed for mobility and pain mgmt    PT Home Exercise Plan Access Code: [email or web address]. Supine Heel Slide with Strap , Seated Heel Slide , Supine Quadricep Sets, Seated Hamstring Stretch    Consulted and Agree with Plan of Care Patient           Patient will benefit from skilled therapeutic intervention in order to improve the following deficits and impairments:  Abnormal gait,Decreased range of motion,Difficulty walking,Increased fascial restricitons,Increased muscle spasms,Decreased endurance,Decreased activity tolerance,Pain,Improper body mechanics,Impaired flexibility,Decreased balance,Decreased mobility,Decreased strength,Increased edema,Postural  dysfunction  Visit Diagnosis: Acute pain of left knee  Other abnormalities of gait and mobility  Difficulty in walking, not elsewhere classified  Localized edema  Muscle weakness (generalized)  Stiffness of left knee, not elsewhere classified     Problem List Patient Active Problem List   Diagnosis Date Noted  . History of kidney stones   . 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 Arthritis of left knee 12/02/2020  . Renal insufficiency 08/12/2020  . Ischemic cardiomyopathy 08/12/2020  . CAD (coronary artery disease)   . DM (diabetes mellitus) ([place])   . HTN (hypertension)   . Hyperlipidemia   . Diabetes mellitus due to underlying condition with unspecified complications ([place]) 15/02/1363  . Paroxysmal atrial fibrillation ([place]) 07/16/2020  . Erythrocytosis 06/01/2020  . Unilateral primary osteoarthritis, right knee 11/15/2018  . 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 Depressive disorder 11/12/2018  . 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 History of colonic polyps 11/12/2018  . History of nephrolithiasis 11/12/2018  . History of smoking 11/12/2018  . 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 Medication intolerance 11/12/2018  . Old myocardial infarction 11/12/2018  . Mixed hyperlipidemia 11/12/2018  . Pre-operative cardiovascular examination 11/12/2018  . CAD (coronary artery disease), native coronary artery 11/12/2018  . Bilateral leg edema 05/04/2017  . Primary osteoarthritis of both knees 03/05/2017  . CAP (community acquired pneumonia) 08/17/2016  . Right carotid bruit 08/05/2014  . HYPERTENSION, BENIGN 06/15/2009  . CAD, NATIVE VESSEL 06/15/2009  . Type II or unspecified type diabetes mellitus without mention of complication, not stated as uncontrolled 10/14/2007  . Gout, unspecified 10/14/2007  . Essential hypertension 10/14/2007    Janene Harvey, PT, DPT 02/08/21 2:25 PM   [facility] [address]  [place], [place], [address] Phone: [phone]   Fax:  [phone]  Name: [name] [name] MRN: [id number] Date of Birth: [birth year]

## 2021-02-08 NOTE — Patient Instructions (Signed)
    IONTOPHORESIS PATIENT PRECAUTIONS & CONTRAINDICATIONS:  Redness under one or both electrodes can occur.  This characterized by a uniform redness that usually disappears within 12 hours of treatment. Small pinhead size blisters may result in response to the drug.  Contact your physician if the problem persists more than 24 hours. On rare occasions, iontophoresis therapy can result in temporary skin reactions such as rash, inflammation, irritation or burns.  The skin reactions may be the result of individual sensitivity to the ionic solution used, the condition of the skin at the start of treatment, reaction to the materials in the electrodes, allergies or sensitivity to dexamethasone, or a poor connection between the patch and your skin.  Discontinue using iontophoresis if you have any of these reactions and report to your therapist. Remove the Patch or electrodes if you have any undue sensation of pain or burning during the treatment and report discomfort to your therapist. Tell your Therapist if you have had known adverse reactions to the application of electrical current. Approximate treatment time is 4-6 hours.  Remove the patch after 6 hours. The Patch can be worn during normal activity, however excessive motion where the electrodes have been placed can cause poor contact between the skin and the electrode or uneven electrical current resulting in greater risk of skin irritation. Keep out of the reach of children.   DO NOT use if you have a cardiac pacemaker or any other electrically sensitive implanted device. DO NOT use if you have a known sensitivity to dexamethasone. DO NOT use during Magnetic Resonance Imaging (MRI). DO NOT use over broken or compromised skin (e.g. sunburn, cuts, or acne) due to the increased risk of skin reaction. DO NOT SHAVE over the area to be treated:  To establish good contact between the Patch and the skin, excessive hair may be clipped. DO NOT place the  Patch or electrodes on or over your eyes, directly over your heart, or brain. DO NOT reuse the Patch or electrodes as this may cause burns to occur.   For questions, please contact your therapist at:  [facility] [address]  [address] [place], [place], [address] Phone: [phone]   Fax:  [phone]  

## 2021-02-10 ENCOUNTER — Other Ambulatory Visit: Payer: Self-pay

## 2021-02-10 ENCOUNTER — Encounter: Payer: Self-pay | Admitting: Physician Assistant

## 2021-02-10 ENCOUNTER — Ambulatory Visit (INDEPENDENT_AMBULATORY_CARE_PROVIDER_SITE_OTHER): Payer: Medicare Other | Admitting: Physician Assistant

## 2021-02-10 ENCOUNTER — Encounter: Payer: Self-pay | Admitting: Physical Therapy

## 2021-02-10 ENCOUNTER — Ambulatory Visit: Payer: Medicare Other | Admitting: Physical Therapy

## 2021-02-10 DIAGNOSIS — R2689 Other abnormalities of gait and mobility: Secondary | ICD-10-CM | POA: Diagnosis not present

## 2021-02-10 DIAGNOSIS — M25562 Pain in left knee: Secondary | ICD-10-CM | POA: Diagnosis not present

## 2021-02-10 DIAGNOSIS — M6281 Muscle weakness (generalized): Secondary | ICD-10-CM

## 2021-02-10 DIAGNOSIS — R600 Localized edema: Secondary | ICD-10-CM | POA: Diagnosis not present

## 2021-02-10 DIAGNOSIS — Z96652 Presence of left artificial knee joint: Secondary | ICD-10-CM

## 2021-02-10 DIAGNOSIS — M25662 Stiffness of left knee, not elsewhere classified: Secondary | ICD-10-CM

## 2021-02-10 DIAGNOSIS — R262 Difficulty in walking, not elsewhere classified: Secondary | ICD-10-CM | POA: Diagnosis not present

## 2021-02-10 NOTE — Progress Notes (Signed)
HPI: [name] returns today mailed almost 10 weeks status post left total knee arthroplasty.  He is using a walker to ambulate.  He feels like his motion is overall improving.  He is working with physical therapy has 2 more visits plan.  Therapy measures him at -3 degrees extension and 115 degrees of flexion.  ACE trying to gain approval for Lidoderm patches states insurance needs more information but he does find the patches that physical therapy places on his need to be beneficial.  Physical exam: Left knee surgical incisions healing well.  Lacks full extension by about 3 degrees and flexes to 105 degrees.  No instability valgus varus stressing.  Calf supple nontender.  Impression: Status post left total knee arthroplasty 12/03/2020  Plan: He will continue work on range of motion strengthening the knee.  We will try to gain approval for the Lidoderm patches for his knee.  We will see him back in 3 months sooner if there is any questions or concerns.  He will continue work on scar tissue mobilization.  Questions were encouraged and answered at length.  We will obtain an AP and lateral view of his left knee at return.

## 2021-02-10 NOTE — Therapy (Signed)
[facility] [address]  [place], [place], [address] Phone: [phone]   Fax:  [phone]  Physical Therapy Treatment  Patient Details  Name: [name] [name] MRN: [id number] Date of Birth: 10/21/1951 Referring Provider (PT): Ninfa Linden   Encounter Date: 02/10/2021   PT End of Session - 02/10/21 1400    Visit Number 12    Number of Visits 17    Date for PT Re-Evaluation 02/23/21    PT Start Time 1318    PT Stop Time 1356    PT Time Calculation (min) 38 min    Activity Tolerance Patient limited by pain;Patient tolerated treatment well    Behavior During Therapy [facility] for tasks assessed/performed           Past Medical History:  Diagnosis Date  . Arthritis of left knee 12/02/2020  . Bilateral leg edema 05/04/2017  . 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 CAD (coronary artery disease), native coronary artery 11/12/2018   Formatting of this note might be different from the original. S/p MI - Dr. Maurene Capes  . CAD, NATIVE VESSEL 06/15/2009   Qualifier: Diagnosis of  By: Haroldine Laws, MD, Eileen Stanford CAP (community acquired pneumonia) 08/17/2016  . Depressive disorder 11/12/2018  . Diabetes mellitus due to underlying condition with unspecified complications ([place]) 1/69/6789  . DM (diabetes mellitus) ([place])   . 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 Erythrocytosis 06/01/2020  . Essential hypertension 10/14/2007   Formatting of this note might be different from the original. Hypertension  10/1 IMO update  . Gout, unspecified 10/14/2007   Overview:  Gout  10/1 IMO update  Formatting of this note might be different from the original. Gout  10/1 IMO update  . History of colonic polyps 11/12/2018   Overview:  rpt colonoscopy 5/17; Dr. Haig Prophet of this note might be different from the original. rpt  colonoscopy 5/17; Dr. Erlene Quan  . History of kidney stones   . History of nephrolithiasis 11/12/2018  . History of smoking 11/12/2018   Overview:  quit 2000  Formatting of this note might be different from the original. quit 2000  . HTN (hypertension)   . Hyperlipidemia   . HYPERLIPIDEMIA-MIXED 10/14/2007   Qualifier: Diagnosis of  By: Haroldine Laws, MD, Willette Cluster of this note might be different from the original. Hyperlipidemia  . HYPERTENSION, BENIGN 06/15/2009  . 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 Ischemic cardiomyopathy 08/12/2020  . Medication intolerance 11/12/2018  . Mixed hyperlipidemia 11/12/2018  . Old myocardial infarction 11/12/2018  . Paroxysmal atrial fibrillation ([place]) 07/16/2020  . Pre-operative cardiovascular examination 11/12/2018  . Primary osteoarthritis of both knees 03/05/2017   Considering Zilretta injections. Good improvement with corticosteroid injections however short-lived. Mild improvement with Visco supplementation.  . Renal insufficiency 08/12/2020   pt denies  . Right carotid bruit 08/05/2014  . 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 Type II or unspecified type diabetes mellitus without mention of complication, not stated as uncontrolled 10/14/2007   Formatting of this note might be different from the original. Diabetes Mellitus  . Unilateral primary osteoarthritis, right knee 11/15/2018    Past Surgical History:  Procedure Laterality Date  .  BILATERAL KNEE ARTHROSCOPY    . CORONARY ANGIOPLASTY WITH STENT PLACEMENT    . LAPAROSCOPIC INGUINAL HERNIA REPAIR    . NOSE SURGERY    . TOTAL KNEE ARTHROPLASTY Right 11/15/2018   Procedure: RIGHT TOTAL KNEE ARTHROPLASTY;  Surgeon: Mcarthur Rossetti, MD;  Location: [facility];  Service: Orthopedics;  Laterality: Right;  . TOTAL KNEE ARTHROPLASTY Left 12/03/2020    Procedure: LEFT TOTAL KNEE ARTHROPLASTY;  Surgeon: Mcarthur Rossetti, MD;  Location: [facility];  Service: Orthopedics;  Laterality: Left;    There were no vitals filed for this visit.   Subjective Assessment - 02/10/21 1320    Subjective Would like to try the ionto as he has read up on it.    Pertinent History HTN, arthritis, T2DM, history of heart attack with stents placed in 2009    How long can you walk comfortably? short household distances, increased pain with functional mobility in the house. most limited by pain.    Patient Stated Goals to decrease pain, improve strength, improve stability.    Currently in Pain? Yes    Pain Score 7     Pain Location Knee    Pain Orientation Left;Lateral    Pain Descriptors / Indicators Sharp    Pain Type Acute pain                             [facility] Adult PT Treatment/Exercise - 02/10/21 0001      Knee/Hip Exercises: Aerobic   Stationary Bike partial revolutions for ROM x6 min      Knee/Hip Exercises: Supine   Quad Sets Strengthening;Left;1 set;10 reps    Quad Sets Limitations 10x5" with 1/2 bolster under heel    Heel Slides AAROM;Left;1 set;10 reps    Heel Slides Limitations 10x3 sec wiht orange ball    Bridges Strengthening;1 set;10 reps   red TB above knees; limited ROM   Straight Leg Raises Strengthening;Left;10 reps;2 sets    Straight Leg Raises Limitations no quad lag; lacking TKE; 2nd set with 2#      Modalities   Modalities Iontophoresis      Iontophoresis   Type of Iontophoresis Dexamethasone    Location L lateral knees    Dose 1.23m; 80 mA*minutes    Time 4 hour patch      Manual Therapy   Manual Therapy Passive ROM    Passive ROM L TFL stretch in sidelying 2x30"                  PT Education - 02/10/21 1400    Education Details review of ionto precautions, wear time, removal; update to HEP    Person(s) Educated Patient    Methods Explanation;Demonstration;Tactile cues;Verbal cues     Comprehension Verbalized understanding            PT Short Term Goals - 02/01/21 1400      PT SHORT TERM GOAL #1   Title Independent with initial HEP    Time 2    Period Weeks    Status Achieved    Target Date 01/12/21             PT Long Term Goals - 02/01/21 1400      PT LONG TERM GOAL #1   Title Independent with ongoing HEP    Time 8    Period Weeks    Status Partially Met   met for current     PT LONG  TERM GOAL #2   Title R knee AROM >/= 3-115 dg to allow for normal gait and stair pattern    Time 8    Period Weeks    Status On-going   AROM is now reaching 3-95 degrees and PROM 2-99 degrees     PT LONG TERM GOAL #3   Title B LE strength >/= 4+/5 for improved stability    Time 8    Period Weeks    Status Achieved   limited in L knee flexion, extension     PT LONG TERM GOAL #4   Title Patient will ambulate with normal gait pattern w/o AD to increase ease of community access    Time 8    Period Weeks    Status On-going   demosntrated decreased L knee flexion during swing phase     PT LONG TERM GOAL #5   Title Patient will ascend/descend 1 flight of stairs with good reciprocal pattern    Time 8    Period Weeks    Status Partially Met   demonstrating reciprocal alternating pattern ascending, step to pattern descending                Plan - 02/10/21 1401    Clinical Impression Statement Received order from patient's PA for iontophoresis to L knee d/t patient's continued pain. Worked on progressive quad strengthening. Patient no longer demonstrates quad lag with SLRs but does demonstrate slight lack in TKE. L knee extension did improve considerably after performing quad sets. Worked on gentle L TFL stretching with patient noting discomfort but actually tolerating better than previous sessions. Patient reported L knee feeling "looser" after stretching, thus updated HEP with TFL stretch. Patient reported understanding of ionto precautions, wear time, removal,  thus placed ionto patch to L lateral knee at end of session. Patient without complaints at end of session.    Comorbidities HTN, arthritis, T2DM, history of heart attack with stents placed in 2009, previously had R TKA    PT Frequency 2x / week    PT Duration 8 weeks    PT Treatment/Interventions ADLs/Self Care Home Management;Vasopneumatic Device;Cryotherapy;Electrical Stimulation;Iontophoresis 76m/ml Dexamethasone;Neuromuscular re-education;Balance training;Therapeutic exercise;Therapeutic activities;Functional mobility training;Stair training;Gait training;Patient/family education;Manual techniques;Joint Manipulations;Taping;Scar mobilization    PT Next Visit Plan Progress TE, gait training  as tolerated. Manual and modalities as needed for mobility and pain mgmt    PT Home Exercise Plan Access Code: [email or web address]. Supine Heel Slide with Strap , Seated Heel Slide , Supine Quadricep Sets, Seated Hamstring Stretch    Consulted and Agree with Plan of Care Patient           Patient will benefit from skilled therapeutic intervention in order to improve the following deficits and impairments:  Abnormal gait,Decreased range of motion,Difficulty walking,Increased fascial restricitons,Increased muscle spasms,Decreased endurance,Decreased activity tolerance,Pain,Improper body mechanics,Impaired flexibility,Decreased balance,Decreased mobility,Decreased strength,Increased edema,Postural dysfunction  Visit Diagnosis: Acute pain of left knee  Other abnormalities of gait and mobility  Difficulty in walking, not elsewhere classified  Localized edema  Muscle weakness (generalized)  Stiffness of left knee, not elsewhere classified     Problem List Patient Active Problem List   Diagnosis Date Noted  . History of kidney stones   . 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 Arthritis of left knee 12/02/2020  . Renal insufficiency 08/12/2020  . Ischemic cardiomyopathy 08/12/2020  . CAD  (coronary artery disease)   . DM (diabetes mellitus) ([place]   . HTN (hypertension)   . Hyperlipidemia   .  Diabetes mellitus due to underlying condition with unspecified complications ([place]) 99/77/4142  . Paroxysmal atrial fibrillation ([place]) 07/16/2020  . Erythrocytosis 06/01/2020  . Unilateral primary osteoarthritis, right knee 11/15/2018  . 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 Depressive disorder 11/12/2018  . 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 History of colonic polyps 11/12/2018  . History of nephrolithiasis 11/12/2018  . History of smoking 11/12/2018  . 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 Medication intolerance 11/12/2018  . Old myocardial infarction 11/12/2018  . Mixed hyperlipidemia 11/12/2018  . Pre-operative cardiovascular examination 11/12/2018  . CAD (coronary artery disease), native coronary artery 11/12/2018  . Bilateral leg edema 05/04/2017  . Primary osteoarthritis of both knees 03/05/2017  . CAP (community acquired pneumonia) 08/17/2016  . Right carotid bruit 08/05/2014  . HYPERTENSION, BENIGN 06/15/2009  . CAD, NATIVE VESSEL 06/15/2009  . Type II or unspecified type diabetes mellitus without mention of complication, not stated as uncontrolled 10/14/2007  . Gout, unspecified 10/14/2007  . Essential hypertension 10/14/2007     Janene Harvey, PT, DPT 02/10/21 2:08 PM   [facility] [address]  [place], [place], [address] Phone: [phone]   Fax:  [phone]  Name: [name] [name] MRN: [id number] Date of Birth: 24-Mar-1951

## 2021-02-16 ENCOUNTER — Other Ambulatory Visit: Payer: Self-pay

## 2021-02-16 ENCOUNTER — Ambulatory Visit: Payer: Medicare Other

## 2021-02-16 DIAGNOSIS — R2689 Other abnormalities of gait and mobility: Secondary | ICD-10-CM

## 2021-02-16 DIAGNOSIS — M6281 Muscle weakness (generalized): Secondary | ICD-10-CM | POA: Diagnosis not present

## 2021-02-16 DIAGNOSIS — M25662 Stiffness of left knee, not elsewhere classified: Secondary | ICD-10-CM

## 2021-02-16 DIAGNOSIS — R600 Localized edema: Secondary | ICD-10-CM | POA: Diagnosis not present

## 2021-02-16 DIAGNOSIS — M25562 Pain in left knee: Secondary | ICD-10-CM

## 2021-02-16 DIAGNOSIS — R262 Difficulty in walking, not elsewhere classified: Secondary | ICD-10-CM | POA: Diagnosis not present

## 2021-02-16 NOTE — Therapy (Addendum)
[facility] [address]  [place], [place], [address] Phone: [phone]   Fax:  [phone]  Physical Therapy Treatment  Patient Details  Name: [name] [name] MRN: [id number] Date of Birth: 26-Jun-1951 Referring Provider (PT): Ninfa Linden  Progress Note Reporting Period 02/08/21 to 02/16/21  See note below for Objective Data and Assessment of Progress/Goals.     Encounter Date: 02/16/2021   PT End of Session - 02/16/21 1529    Visit Number 13    Number of Visits 17    Date for PT Re-Evaluation 02/23/21    PT Start Time 1324    PT Stop Time 4010    PT Time Calculation (min) 53 min    Activity Tolerance Patient limited by pain;Patient tolerated treatment well    Behavior During Therapy [facility] for tasks assessed/performed           Past Medical History:  Diagnosis Date  . Arthritis of left knee 12/02/2020  . Bilateral leg edema 05/04/2017  . 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 CAD (coronary artery disease), native coronary artery 11/12/2018   Formatting of this note might be different from the original. S/p MI - Dr. Maurene Capes  . CAD, NATIVE VESSEL 06/15/2009   Qualifier: Diagnosis of  By: Haroldine Laws, MD, Eileen Stanford CAP (community acquired pneumonia) 08/17/2016  . Depressive disorder 11/12/2018  . Diabetes mellitus due to underlying condition with unspecified complications ([place]) 2/72/5366  . DM (diabetes mellitus) ([place])   . 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 Erythrocytosis 06/01/2020  . Essential hypertension 10/14/2007   Formatting of this note might be different from the original. Hypertension  10/1 IMO update  . Gout, unspecified 10/14/2007   Overview:  Gout  10/1 IMO update  Formatting of this note might be different from the original. Gout  10/1 IMO update  . History of colonic polyps  11/12/2018   Overview:  rpt colonoscopy 5/17; Dr. Haig Prophet of this note might be different from the original. rpt colonoscopy 5/17; Dr. Erlene Quan  . History of kidney stones   . History of nephrolithiasis 11/12/2018  . History of smoking 11/12/2018   Overview:  quit 2000  Formatting of this note might be different from the original. quit 2000  . HTN (hypertension)   . Hyperlipidemia   . HYPERLIPIDEMIA-MIXED 10/14/2007   Qualifier: Diagnosis of  By: Haroldine Laws, MD, Willette Cluster of this note might be different from the original. Hyperlipidemia  . HYPERTENSION, BENIGN 06/15/2009  . 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 Ischemic cardiomyopathy 08/12/2020  . Medication intolerance 11/12/2018  . Mixed hyperlipidemia 11/12/2018  . Old myocardial infarction 11/12/2018  . Paroxysmal atrial fibrillation ([place]) 07/16/2020  . Pre-operative cardiovascular examination 11/12/2018  . Primary osteoarthritis of both knees 03/05/2017   Considering Zilretta injections. Good improvement with corticosteroid injections however short-lived. Mild improvement with Visco supplementation.  . Renal insufficiency 08/12/2020   pt denies  . Right carotid bruit 08/05/2014  . 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 Type II or unspecified type diabetes mellitus without mention of complication, not stated as uncontrolled 10/14/2007   Formatting of this note might be different from the original.  Diabetes Mellitus  . Unilateral primary osteoarthritis, right knee 11/15/2018    Past Surgical History:  Procedure Laterality Date  . BILATERAL KNEE ARTHROSCOPY    . CORONARY ANGIOPLASTY WITH STENT PLACEMENT    . LAPAROSCOPIC INGUINAL HERNIA REPAIR    . NOSE SURGERY    . TOTAL KNEE ARTHROPLASTY Right 11/15/2018   Procedure: RIGHT TOTAL KNEE ARTHROPLASTY;  Surgeon: Mcarthur Rossetti, MD;  Location: [facility];  Service: Orthopedics;  Laterality: Right;  . TOTAL KNEE ARTHROPLASTY Left 12/03/2020   Procedure: LEFT TOTAL KNEE ARTHROPLASTY;  Surgeon: Mcarthur Rossetti, MD;  Location: [facility];  Service: Orthopedics;  Laterality: Left;    There were no vitals filed for this visit.   Subjective Assessment - 02/16/21 1449    Subjective Pt reports being a little stiff today.    Pertinent History HTN, arthritis, T2DM, history of heart attack with stents placed in 2009    Patient Stated Goals to decrease pain, improve strength, improve stability.    Currently in Pain? Yes    Pain Score 7     Pain Location Knee    Pain Orientation Left;Lateral;Medial    Pain Descriptors / Indicators Sharp    Pain Type Acute pain              [facility] PT Assessment - 02/16/21 0001      AROM   Left Knee Extension 3    Left Knee Flexion 96                         [facility] Adult PT Treatment/Exercise - 02/16/21 0001      Knee/Hip Exercises: Aerobic   Stationary Bike partial revolutions for ROM x6 min      Knee/Hip Exercises: Supine   Quad Sets Strengthening;Left;1 set;10 reps    Quad Sets Limitations 10x5" pillow under ankle; 5 reps with Gtband for TKE    Heel Slides AAROM;Left;2 sets;10 reps;Limitations    Heel Slides Limitations 5" hold   with strap   Straight Leg Raises Strengthening;Left;10 reps;2 sets    Straight Leg Raises Limitations good control, no quad lag      Modalities   Modalities Vasopneumatic      Vasopneumatic   Number Minutes Vasopneumatic  10 minutes    Vasopnuematic Location  Knee    Vasopneumatic Pressure Low    Vasopneumatic Temperature  34      Manual Therapy   Manual Therapy Passive ROM    Manual therapy comments supine    Passive ROM L knee all motions gentle stretch into end range                    PT Short Term Goals - 02/01/21 1400      PT SHORT TERM GOAL #1   Title Independent with initial HEP    Time 2     Period Weeks    Status Achieved    Target Date 01/12/21             PT Long Term Goals - 02/01/21 1400      PT LONG TERM GOAL #1   Title Independent with ongoing HEP    Time 8    Period Weeks    Status Partially Met   met for current     PT LONG TERM GOAL #2   Title R knee AROM >/= 3-115 dg to allow for normal gait and stair pattern    Time 8  Period Weeks    Status On-going   AROM is now reaching 3-95 degrees and PROM 2-99 degrees     PT LONG TERM GOAL #3   Title B LE strength >/= 4+/5 for improved stability    Time 8    Period Weeks    Status Achieved   limited in L knee flexion, extension     PT LONG TERM GOAL #4   Title Patient will ambulate with normal gait pattern w/o AD to increase ease of community access    Time 8    Period Weeks    Status On-going   demosntrated decreased L knee flexion during swing phase     PT LONG TERM GOAL #5   Title Patient will ascend/descend 1 flight of stairs with good reciprocal pattern    Time 8    Period Weeks    Status Partially Met   demonstrating reciprocal alternating pattern ascending, step to pattern descending                Plan - 02/16/21 1529    Clinical Impression Statement Good response from pt overall, some pain when going into end range knee motions but he noted that it was tolerable. Showed an increase in knee flexion post PROM and HS with knee ROM meaured at 3-96. Cues given to isolate the targeted muscles and to not push too far into pain. Still showed good quad control and hip flexor strength with SLRs. Pt still needs work on knee ROM to improve gait mechanics and to increase ability to navigate stairs.    Personal Factors and Comorbidities Comorbidity 3+;Fitness;Past/Current Experience;Time since onset of injury/illness/exacerbation    Comorbidities HTN, arthritis, T2DM, history of heart attack with stents placed in 2009, previously had R TKA    PT Frequency 2x / week    PT Duration 8 weeks    PT  Treatment/Interventions ADLs/Self Care Home Management;Vasopneumatic Device;Cryotherapy;Electrical Stimulation;Iontophoresis 88m/ml Dexamethasone;Neuromuscular re-education;Balance training;Therapeutic exercise;Therapeutic activities;Functional mobility training;Stair training;Gait training;Patient/family education;Manual techniques;Joint Manipulations;Taping;Scar mobilization    PT Next Visit Plan Progress TE, gait training  as tolerated. Manual and modalities as needed for mobility and pain mgmt    PT Home Exercise Plan Access Code: [email or web address]. Supine Heel Slide with Strap , Seated Heel Slide , Supine Quadricep Sets, Seated Hamstring Stretch    Consulted and Agree with Plan of Care Patient           Patient will benefit from skilled therapeutic intervention in order to improve the following deficits and impairments:  Abnormal gait,Decreased range of motion,Difficulty walking,Increased fascial restricitons,Increased muscle spasms,Decreased endurance,Decreased activity tolerance,Pain,Improper body mechanics,Impaired flexibility,Decreased balance,Decreased mobility,Decreased strength,Increased edema,Postural dysfunction  Visit Diagnosis: Acute pain of left knee  Other abnormalities of gait and mobility  Difficulty in walking, not elsewhere classified  Localized edema  Stiffness of left knee, not elsewhere classified     Problem List Patient Active Problem List   Diagnosis Date Noted  . History of kidney stones   . 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 Arthritis of left knee 12/02/2020  . Renal insufficiency 08/12/2020  . Ischemic cardiomyopathy 08/12/2020  . CAD (coronary artery disease)   . DM (diabetes mellitus) ([place]   . HTN (hypertension)   . Hyperlipidemia   . Diabetes mellitus due to underlying condition with unspecified complications ([place] 035/32/9924 . Paroxysmal atrial fibrillation ([place] 07/16/2020  . Erythrocytosis 06/01/2020  . Unilateral primary  osteoarthritis, right knee 11/15/2018  . Status post total right knee replacement  11/15/2018  . Depressive disorder 11/12/2018  . 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 History of colonic polyps 11/12/2018  . History of nephrolithiasis 11/12/2018  . History of smoking 11/12/2018  . 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 Medication intolerance 11/12/2018  . Old myocardial infarction 11/12/2018  . Mixed hyperlipidemia 11/12/2018  . Pre-operative cardiovascular examination 11/12/2018  . CAD (coronary artery disease), native coronary artery 11/12/2018  . Bilateral leg edema 05/04/2017  . Primary osteoarthritis of both knees 03/05/2017  . CAP (community acquired pneumonia) 08/17/2016  . Right carotid bruit 08/05/2014  . HYPERTENSION, BENIGN 06/15/2009  . CAD, NATIVE VESSEL 06/15/2009  . Type II or unspecified type diabetes mellitus without mention of complication, not stated as uncontrolled 10/14/2007  . Gout, unspecified 10/14/2007  . Essential hypertension 10/14/2007    Artist Pais, PTA 02/16/2021, 4:00 PM  [facility] [address]  [place], [place], [address] Phone: [phone]   Fax:  [phone]  Name: [name] [name] MRN: [id number] Date of Birth: 31-Oct-1951  PHYSICAL THERAPY DISCHARGE SUMMARY  Visits from Start of Care: 13  Current functional level related to goals / functional outcomes: Unable to assess; patient did not return d/t feeling better   Remaining deficits: Unable to assess   Education / Equipment: HEP  Plan: Patient agrees to discharge.  Patient goals were partially met. Patient is being discharged due to being pleased with the current functional level.  ?????     Janene Harvey, PT, DPT 04/07/21 2:52 PM

## 2021-02-22 ENCOUNTER — Encounter: Payer: Medicare Other | Admitting: Physical Therapy

## 2021-03-01 DIAGNOSIS — D751 Secondary polycythemia: Secondary | ICD-10-CM | POA: Diagnosis not present

## 2021-03-08 ENCOUNTER — Telehealth: Payer: Self-pay | Admitting: *Deleted

## 2021-03-08 NOTE — Telephone Encounter (Signed)
Ortho bundle 90 day call completed. 

## 2021-03-09 DIAGNOSIS — M109 Gout, unspecified: Secondary | ICD-10-CM | POA: Diagnosis not present

## 2021-03-09 DIAGNOSIS — E119 Type 2 diabetes mellitus without complications: Secondary | ICD-10-CM | POA: Diagnosis not present

## 2021-03-09 DIAGNOSIS — E785 Hyperlipidemia, unspecified: Secondary | ICD-10-CM | POA: Diagnosis not present

## 2021-04-29 DIAGNOSIS — D696 Thrombocytopenia, unspecified: Secondary | ICD-10-CM | POA: Diagnosis not present

## 2021-04-29 DIAGNOSIS — D751 Secondary polycythemia: Secondary | ICD-10-CM | POA: Diagnosis not present

## 2021-05-03 ENCOUNTER — Emergency Department (HOSPITAL_COMMUNITY): Payer: Medicare Other

## 2021-05-03 ENCOUNTER — Encounter (HOSPITAL_COMMUNITY): Payer: Self-pay

## 2021-05-03 ENCOUNTER — Inpatient Hospital Stay (HOSPITAL_COMMUNITY)
Admission: EM | Admit: 2021-05-03 | Discharge: 2021-05-06 | DRG: 286 | Disposition: A | Discharge status: Home / Self Care | Payer: Medicare Other | Attending: Family Medicine | Admitting: Family Medicine

## 2021-05-03 DIAGNOSIS — I252 Old myocardial infarction: Secondary | ICD-10-CM | POA: Diagnosis not present

## 2021-05-03 DIAGNOSIS — I110 Hypertensive heart disease with heart failure: Secondary | ICD-10-CM | POA: Diagnosis not present

## 2021-05-03 DIAGNOSIS — Z833 Family history of diabetes mellitus: Secondary | ICD-10-CM | POA: Diagnosis not present

## 2021-05-03 DIAGNOSIS — I509 Heart failure, unspecified: Secondary | ICD-10-CM

## 2021-05-03 DIAGNOSIS — I4510 Unspecified right bundle-branch block: Secondary | ICD-10-CM | POA: Diagnosis not present

## 2021-05-03 DIAGNOSIS — I255 Ischemic cardiomyopathy: Secondary | ICD-10-CM | POA: Diagnosis not present

## 2021-05-03 DIAGNOSIS — R404 Transient alteration of awareness: Secondary | ICD-10-CM | POA: Diagnosis not present

## 2021-05-03 DIAGNOSIS — I77810 Thoracic aortic ectasia: Secondary | ICD-10-CM | POA: Diagnosis present

## 2021-05-03 DIAGNOSIS — Z87891 Personal history of nicotine dependence: Secondary | ICD-10-CM | POA: Diagnosis not present

## 2021-05-03 DIAGNOSIS — R778 Other specified abnormalities of plasma proteins: Secondary | ICD-10-CM | POA: Diagnosis present

## 2021-05-03 DIAGNOSIS — Z794 Long term (current) use of insulin: Secondary | ICD-10-CM

## 2021-05-03 DIAGNOSIS — R0602 Shortness of breath: Secondary | ICD-10-CM | POA: Diagnosis not present

## 2021-05-03 DIAGNOSIS — Z955 Presence of coronary angioplasty implant and graft: Secondary | ICD-10-CM

## 2021-05-03 DIAGNOSIS — I452 Bifascicular block: Secondary | ICD-10-CM | POA: Diagnosis present

## 2021-05-03 DIAGNOSIS — E538 Deficiency of other specified B group vitamins: Secondary | ICD-10-CM | POA: Diagnosis present

## 2021-05-03 DIAGNOSIS — N1832 Chronic kidney disease, stage 3b: Secondary | ICD-10-CM | POA: Diagnosis present

## 2021-05-03 DIAGNOSIS — M109 Gout, unspecified: Secondary | ICD-10-CM | POA: Diagnosis not present

## 2021-05-03 DIAGNOSIS — R609 Edema, unspecified: Secondary | ICD-10-CM | POA: Diagnosis not present

## 2021-05-03 DIAGNOSIS — I25119 Atherosclerotic heart disease of native coronary artery with unspecified angina pectoris: Secondary | ICD-10-CM | POA: Diagnosis not present

## 2021-05-03 DIAGNOSIS — Z79891 Long term (current) use of opiate analgesic: Secondary | ICD-10-CM

## 2021-05-03 DIAGNOSIS — Z8673 Personal history of transient ischemic attack (TIA), and cerebral infarction without residual deficits: Secondary | ICD-10-CM

## 2021-05-03 DIAGNOSIS — Z7984 Long term (current) use of oral hypoglycemic drugs: Secondary | ICD-10-CM

## 2021-05-03 DIAGNOSIS — E785 Hyperlipidemia, unspecified: Secondary | ICD-10-CM | POA: Diagnosis not present

## 2021-05-03 DIAGNOSIS — I959 Hypotension, unspecified: Secondary | ICD-10-CM | POA: Diagnosis not present

## 2021-05-03 DIAGNOSIS — D696 Thrombocytopenia, unspecified: Secondary | ICD-10-CM | POA: Diagnosis present

## 2021-05-03 DIAGNOSIS — I441 Atrioventricular block, second degree: Secondary | ICD-10-CM | POA: Diagnosis present

## 2021-05-03 DIAGNOSIS — Z743 Need for continuous supervision: Secondary | ICD-10-CM | POA: Diagnosis not present

## 2021-05-03 DIAGNOSIS — E782 Mixed hyperlipidemia: Secondary | ICD-10-CM | POA: Diagnosis present

## 2021-05-03 DIAGNOSIS — R6889 Other general symptoms and signs: Secondary | ICD-10-CM | POA: Diagnosis not present

## 2021-05-03 DIAGNOSIS — Z8249 Family history of ischemic heart disease and other diseases of the circulatory system: Secondary | ICD-10-CM

## 2021-05-03 DIAGNOSIS — J9811 Atelectasis: Secondary | ICD-10-CM | POA: Diagnosis not present

## 2021-05-03 DIAGNOSIS — Z96653 Presence of artificial knee joint, bilateral: Secondary | ICD-10-CM | POA: Diagnosis present

## 2021-05-03 DIAGNOSIS — Z20822 Contact with and (suspected) exposure to covid-19: Secondary | ICD-10-CM | POA: Diagnosis not present

## 2021-05-03 DIAGNOSIS — I130 Hypertensive heart and chronic kidney disease with heart failure and stage 1 through stage 4 chronic kidney disease, or unspecified chronic kidney disease: Secondary | ICD-10-CM | POA: Diagnosis not present

## 2021-05-03 DIAGNOSIS — Z881 Allergy status to other antibiotic agents status: Secondary | ICD-10-CM

## 2021-05-03 DIAGNOSIS — E876 Hypokalemia: Secondary | ICD-10-CM | POA: Diagnosis present

## 2021-05-03 DIAGNOSIS — I2510 Atherosclerotic heart disease of native coronary artery without angina pectoris: Secondary | ICD-10-CM | POA: Diagnosis not present

## 2021-05-03 DIAGNOSIS — F32A Depression, unspecified: Secondary | ICD-10-CM | POA: Diagnosis not present

## 2021-05-03 DIAGNOSIS — E1122 Type 2 diabetes mellitus with diabetic chronic kidney disease: Secondary | ICD-10-CM | POA: Diagnosis present

## 2021-05-03 DIAGNOSIS — Z7982 Long term (current) use of aspirin: Secondary | ICD-10-CM

## 2021-05-03 DIAGNOSIS — I480 Paroxysmal atrial fibrillation: Secondary | ICD-10-CM | POA: Diagnosis present

## 2021-05-03 DIAGNOSIS — I499 Cardiac arrhythmia, unspecified: Secondary | ICD-10-CM | POA: Diagnosis not present

## 2021-05-03 DIAGNOSIS — Z79899 Other long term (current) drug therapy: Secondary | ICD-10-CM

## 2021-05-03 DIAGNOSIS — Z888 Allergy status to other drugs, medicaments and biological substances status: Secondary | ICD-10-CM

## 2021-05-03 DIAGNOSIS — I5023 Acute on chronic systolic (congestive) heart failure: Secondary | ICD-10-CM | POA: Diagnosis not present

## 2021-05-03 DIAGNOSIS — I10 Essential (primary) hypertension: Secondary | ICD-10-CM | POA: Diagnosis not present

## 2021-05-03 DIAGNOSIS — Z7901 Long term (current) use of anticoagulants: Secondary | ICD-10-CM

## 2021-05-03 LAB — CBC WITH DIFFERENTIAL/PLATELET
Abs Immature Granulocytes: 0.02 10*3/uL (ref 0.00–0.07)
Basophils Absolute: 0 10*3/uL (ref 0.0–0.1)
Basophils Relative: 1 %
Eosinophils Absolute: 0.1 10*3/uL (ref 0.0–0.5)
Eosinophils Relative: 2 %
HCT: 48.6 % (ref 39.0–52.0)
Hemoglobin: 15 g/dL (ref 13.0–17.0)
Immature Granulocytes: 0 %
Lymphocytes Relative: 17 %
Lymphs Abs: 1 10*3/uL (ref 0.7–4.0)
MCH: 26.1 pg (ref 26.0–34.0)
MCHC: 30.9 g/dL (ref 30.0–36.0)
MCV: 84.7 fL (ref 80.0–100.0)
Monocytes Absolute: 0.5 10*3/uL (ref 0.1–1.0)
Monocytes Relative: 9 %
Neutro Abs: 4.1 10*3/uL (ref 1.7–7.7)
Neutrophils Relative %: 71 %
Platelets: 122 10*3/uL — ABNORMAL LOW (ref 150–400)
RBC: 5.74 MIL/uL (ref 4.22–5.81)
RDW: 15 % (ref 11.5–15.5)
WBC: 5.8 10*3/uL (ref 4.0–10.5)
nRBC: 0 % (ref 0.0–0.2)

## 2021-05-03 LAB — BASIC METABOLIC PANEL
Anion gap: 10 (ref 5–15)
BUN: 23 mg/dL (ref 8–23)
CO2: 26 mmol/L (ref 22–32)
Calcium: 9.2 mg/dL (ref 8.9–10.3)
Chloride: 104 mmol/L (ref 98–111)
Creatinine, Ser: 1.8 mg/dL — ABNORMAL HIGH (ref 0.61–1.24)
GFR, Estimated: 40 mL/min — ABNORMAL LOW (ref >60)
Glucose, Bld: 148 mg/dL — ABNORMAL HIGH (ref 70–99)
Potassium: 3 mmol/L — ABNORMAL LOW (ref 3.5–5.1)
Sodium: 140 mmol/L (ref 135–145)

## 2021-05-03 LAB — BRAIN NATRIURETIC PEPTIDE: B Natriuretic Peptide: 646.8 pg/mL — ABNORMAL HIGH (ref 0.0–100.0)

## 2021-05-03 NOTE — ED Triage Notes (Signed)
Pt presents from home via [facility], reports increasing SOB/DOE over the last week. Denies fever, CP or edema.

## 2021-05-03 NOTE — ED Provider Notes (Addendum)
Emergency Medicine Provider Triage Evaluation Note  [name] [name] , a 70 y.o. male  was evaluated in triage.  Pt complains of SHOB, DOE for the past few days, worse today and felt like he was going to pass out. Denies CP. Takes Lasix for left leg swelling. Able to use exercise bike earlier today. Symptoms worse with standing. Denies history of PE/DVT. EF 35-40% 07/28/20 echo  Review of Systems  Positive: SHOB, DOE  Negative: CP  Physical Exam  BP (!) 136/91   Pulse 83   Temp 98.5 F (36.9 C) (Oral)   Resp 18   SpO2 98%  Gen:   Awake, no distress   Resp:  Normal effort  MSK:   Moves extremities without difficulty  Other:  Able to speak in complete sentences, pitting edema to lower extremities.   Medical Decision Making  Medically screening exam initiated at 8:34 PM.  Appropriate orders placed.  [name] [name] was informed that the remainder of the evaluation will be completed by another provider, this initial triage assessment does not replace that evaluation, and the importance of remaining in the ED until their evaluation is complete.     Tacy Learn, PA-C 05/03/21 2038    Tacy Learn, PA-C 05/03/21 2039    Orpah Greek, MD 05/04/21 [phone]

## 2021-05-04 ENCOUNTER — Other Ambulatory Visit: Payer: Self-pay

## 2021-05-04 ENCOUNTER — Inpatient Hospital Stay (HOSPITAL_COMMUNITY): Payer: Medicare Other

## 2021-05-04 DIAGNOSIS — E785 Hyperlipidemia, unspecified: Secondary | ICD-10-CM

## 2021-05-04 DIAGNOSIS — R609 Edema, unspecified: Secondary | ICD-10-CM

## 2021-05-04 DIAGNOSIS — I452 Bifascicular block: Secondary | ICD-10-CM | POA: Diagnosis present

## 2021-05-04 DIAGNOSIS — I130 Hypertensive heart and chronic kidney disease with heart failure and stage 1 through stage 4 chronic kidney disease, or unspecified chronic kidney disease: Secondary | ICD-10-CM | POA: Diagnosis present

## 2021-05-04 DIAGNOSIS — E1122 Type 2 diabetes mellitus with diabetic chronic kidney disease: Secondary | ICD-10-CM | POA: Diagnosis present

## 2021-05-04 DIAGNOSIS — I959 Hypotension, unspecified: Secondary | ICD-10-CM | POA: Diagnosis not present

## 2021-05-04 DIAGNOSIS — I509 Heart failure, unspecified: Secondary | ICD-10-CM | POA: Diagnosis not present

## 2021-05-04 DIAGNOSIS — I2510 Atherosclerotic heart disease of native coronary artery without angina pectoris: Secondary | ICD-10-CM | POA: Diagnosis present

## 2021-05-04 DIAGNOSIS — N1832 Chronic kidney disease, stage 3b: Secondary | ICD-10-CM | POA: Diagnosis present

## 2021-05-04 DIAGNOSIS — E782 Mixed hyperlipidemia: Secondary | ICD-10-CM | POA: Diagnosis present

## 2021-05-04 DIAGNOSIS — I252 Old myocardial infarction: Secondary | ICD-10-CM | POA: Diagnosis not present

## 2021-05-04 DIAGNOSIS — I10 Essential (primary) hypertension: Secondary | ICD-10-CM | POA: Diagnosis not present

## 2021-05-04 DIAGNOSIS — D696 Thrombocytopenia, unspecified: Secondary | ICD-10-CM | POA: Diagnosis present

## 2021-05-04 DIAGNOSIS — E876 Hypokalemia: Secondary | ICD-10-CM

## 2021-05-04 DIAGNOSIS — I5023 Acute on chronic systolic (congestive) heart failure: Secondary | ICD-10-CM | POA: Diagnosis present

## 2021-05-04 DIAGNOSIS — I480 Paroxysmal atrial fibrillation: Secondary | ICD-10-CM | POA: Diagnosis present

## 2021-05-04 DIAGNOSIS — Z955 Presence of coronary angioplasty implant and graft: Secondary | ICD-10-CM | POA: Diagnosis not present

## 2021-05-04 DIAGNOSIS — Z87891 Personal history of nicotine dependence: Secondary | ICD-10-CM | POA: Diagnosis not present

## 2021-05-04 DIAGNOSIS — I441 Atrioventricular block, second degree: Secondary | ICD-10-CM | POA: Diagnosis present

## 2021-05-04 DIAGNOSIS — I77810 Thoracic aortic ectasia: Secondary | ICD-10-CM | POA: Diagnosis present

## 2021-05-04 DIAGNOSIS — I25119 Atherosclerotic heart disease of native coronary artery with unspecified angina pectoris: Secondary | ICD-10-CM | POA: Diagnosis not present

## 2021-05-04 DIAGNOSIS — E538 Deficiency of other specified B group vitamins: Secondary | ICD-10-CM | POA: Diagnosis present

## 2021-05-04 DIAGNOSIS — F32A Depression, unspecified: Secondary | ICD-10-CM | POA: Diagnosis present

## 2021-05-04 DIAGNOSIS — R778 Other specified abnormalities of plasma proteins: Secondary | ICD-10-CM | POA: Diagnosis present

## 2021-05-04 DIAGNOSIS — Z20822 Contact with and (suspected) exposure to covid-19: Secondary | ICD-10-CM | POA: Diagnosis present

## 2021-05-04 DIAGNOSIS — I255 Ischemic cardiomyopathy: Secondary | ICD-10-CM | POA: Diagnosis present

## 2021-05-04 DIAGNOSIS — Z833 Family history of diabetes mellitus: Secondary | ICD-10-CM | POA: Diagnosis not present

## 2021-05-04 DIAGNOSIS — M109 Gout, unspecified: Secondary | ICD-10-CM | POA: Diagnosis present

## 2021-05-04 DIAGNOSIS — Z8249 Family history of ischemic heart disease and other diseases of the circulatory system: Secondary | ICD-10-CM | POA: Diagnosis not present

## 2021-05-04 HISTORY — DX: Acute on chronic systolic (congestive) heart failure: I50.23

## 2021-05-04 LAB — CBC WITH DIFFERENTIAL/PLATELET
Abs Immature Granulocytes: 0.01 10*3/uL (ref 0.00–0.07)
Basophils Absolute: 0 10*3/uL (ref 0.0–0.1)
Basophils Relative: 1 %
Eosinophils Absolute: 0.2 10*3/uL (ref 0.0–0.5)
Eosinophils Relative: 3 %
HCT: 45.3 % (ref 39.0–52.0)
Hemoglobin: 14.3 g/dL (ref 13.0–17.0)
Immature Granulocytes: 0 %
Lymphocytes Relative: 19 %
Lymphs Abs: 1 10*3/uL (ref 0.7–4.0)
MCH: 26.4 pg (ref 26.0–34.0)
MCHC: 31.6 g/dL (ref 30.0–36.0)
MCV: 83.7 fL (ref 80.0–100.0)
Monocytes Absolute: 0.4 10*3/uL (ref 0.1–1.0)
Monocytes Relative: 9 %
Neutro Abs: 3.5 10*3/uL (ref 1.7–7.7)
Neutrophils Relative %: 68 %
Platelets: 96 10*3/uL — ABNORMAL LOW (ref 150–400)
RBC: 5.41 MIL/uL (ref 4.22–5.81)
RDW: 15.1 % (ref 11.5–15.5)
WBC: 5.1 10*3/uL (ref 4.0–10.5)
nRBC: 0 % (ref 0.0–0.2)

## 2021-05-04 LAB — BASIC METABOLIC PANEL
Anion gap: 9 (ref 5–15)
BUN: 22 mg/dL (ref 8–23)
CO2: 33 mmol/L — ABNORMAL HIGH (ref 22–32)
Calcium: 9.1 mg/dL (ref 8.9–10.3)
Chloride: 99 mmol/L (ref 98–111)
Creatinine, Ser: 1.85 mg/dL — ABNORMAL HIGH (ref 0.61–1.24)
GFR, Estimated: 39 mL/min — ABNORMAL LOW (ref >60)
Glucose, Bld: 105 mg/dL — ABNORMAL HIGH (ref 70–99)
Potassium: 3.3 mmol/L — ABNORMAL LOW (ref 3.5–5.1)
Sodium: 141 mmol/L (ref 135–145)

## 2021-05-04 LAB — COMPREHENSIVE METABOLIC PANEL
ALT: 13 U/L (ref 0–44)
AST: 23 U/L (ref 15–41)
Albumin: 3.6 g/dL (ref 3.5–5.0)
Alkaline Phosphatase: 84 U/L (ref 38–126)
Anion gap: 9 (ref 5–15)
BUN: 21 mg/dL (ref 8–23)
CO2: 29 mmol/L (ref 22–32)
Calcium: 9.1 mg/dL (ref 8.9–10.3)
Chloride: 102 mmol/L (ref 98–111)
Creatinine, Ser: 1.53 mg/dL — ABNORMAL HIGH (ref 0.61–1.24)
GFR, Estimated: 49 mL/min — ABNORMAL LOW (ref >60)
Glucose, Bld: 149 mg/dL — ABNORMAL HIGH (ref 70–99)
Potassium: 2.9 mmol/L — ABNORMAL LOW (ref 3.5–5.1)
Sodium: 140 mmol/L (ref 135–145)
Total Bilirubin: 2.5 mg/dL — ABNORMAL HIGH (ref 0.3–1.2)
Total Protein: 6.6 g/dL (ref 6.5–8.1)

## 2021-05-04 LAB — TROPONIN I (HIGH SENSITIVITY)
Troponin I (High Sensitivity): 176 ng/L (ref <18)
Troponin I (High Sensitivity): 252 ng/L (ref <18)
Troponin I (High Sensitivity): 236 ng/L (ref <18)

## 2021-05-04 LAB — PHOSPHORUS: Phosphorus: 3.3 mg/dL (ref 2.5–4.6)

## 2021-05-04 LAB — RESP PANEL BY RT-PCR (FLU A&B, COVID) ARPGX2
Influenza A by PCR: NEGATIVE
Influenza B by PCR: NEGATIVE
SARS Coronavirus 2 by RT PCR: NEGATIVE

## 2021-05-04 LAB — CBG MONITORING, ED
Glucose-Capillary: 126 mg/dL — ABNORMAL HIGH (ref 70–99)
Glucose-Capillary: 142 mg/dL — ABNORMAL HIGH (ref 70–99)

## 2021-05-04 LAB — GLUCOSE, CAPILLARY
Glucose-Capillary: 120 mg/dL — ABNORMAL HIGH (ref 70–99)
Glucose-Capillary: 164 mg/dL — ABNORMAL HIGH (ref 70–99)

## 2021-05-04 LAB — HIV ANTIBODY (ROUTINE TESTING W REFLEX): HIV Screen 4th Generation wRfx: NONREACTIVE

## 2021-05-04 LAB — TSH: TSH: 2.581 u[IU]/mL (ref 0.350–4.500)

## 2021-05-04 LAB — MAGNESIUM: Magnesium: 1.8 mg/dL (ref 1.7–2.4)

## 2021-05-04 MED ORDER — HYDROMORPHONE HCL 2 MG PO TABS: 2.0000 mg | ORAL_TABLET | ORAL | Status: DC | PRN

## 2021-05-04 MED ORDER — POTASSIUM CHLORIDE CRYS ER 20 MEQ PO TBCR
40.0000 meq | EXTENDED_RELEASE_TABLET | Freq: Once | ORAL | Status: AC
  Administered 2021-05-04: 40 meq via ORAL
  Filled 2021-05-04: qty 2

## 2021-05-04 MED ORDER — ALPRAZOLAM 0.25 MG PO TABS: 0.2500 mg | ORAL_TABLET | Freq: Two times a day (BID) | ORAL | Status: DC | PRN

## 2021-05-04 MED ORDER — ROSUVASTATIN CALCIUM 20 MG PO TABS
20.0000 mg | ORAL_TABLET | Freq: Every day | ORAL | Status: DC
  Administered 2021-05-04: 20 mg via ORAL
  Filled 2021-05-04: qty 1

## 2021-05-04 MED ORDER — SODIUM CHLORIDE 0.9% FLUSH
3.0000 mL | Freq: Two times a day (BID) | INTRAVENOUS | Status: DC
  Administered 2021-05-04: 3 mL via INTRAVENOUS

## 2021-05-04 MED ORDER — ALPRAZOLAM 0.25 MG PO TABS: 0.2500 mg | ORAL_TABLET | Freq: Every evening | ORAL | Status: DC | PRN

## 2021-05-04 MED ORDER — ONDANSETRON HCL 4 MG/2ML IJ SOLN: 4.0000 mg | Freq: Four times a day (QID) | INTRAMUSCULAR | Status: DC | PRN

## 2021-05-04 MED ORDER — LEVALBUTEROL TARTRATE 45 MCG/ACT IN AERO: 1.0000 | INHALATION_SPRAY | RESPIRATORY_TRACT | Status: DC | PRN

## 2021-05-04 MED ORDER — CYANOCOBALAMIN 1000 MCG/ML IJ SOLN
1000.0000 ug | INTRAMUSCULAR | Status: DC
  Administered 2021-05-04: 1000 ug via INTRAMUSCULAR
  Filled 2021-05-04: qty 1

## 2021-05-04 MED ORDER — GLIPIZIDE ER 10 MG PO TB24: 10.0000 mg | ORAL_TABLET | Freq: Every day | ORAL | Status: DC

## 2021-05-04 MED ORDER — ASPIRIN 81 MG PO CHEW
81.0000 mg | CHEWABLE_TABLET | ORAL | Status: AC
  Administered 2021-05-05: 81 mg via ORAL
  Filled 2021-05-04: qty 1

## 2021-05-04 MED ORDER — SODIUM CHLORIDE 0.9 % IV SOLN: 250.0000 mL | INTRAVENOUS | Status: DC | PRN

## 2021-05-04 MED ORDER — SODIUM CHLORIDE 0.9 % IV SOLN: INTRAVENOUS | Status: DC

## 2021-05-04 MED ORDER — FUROSEMIDE 10 MG/ML IJ SOLN
40.0000 mg | Freq: Once | INTRAMUSCULAR | Status: AC
  Administered 2021-05-04: 40 mg via INTRAVENOUS
  Filled 2021-05-04: qty 4

## 2021-05-04 MED ORDER — POTASSIUM 99 MG PO TABS: 99.0000 mg | ORAL_TABLET | Freq: Every day | ORAL | Status: DC

## 2021-05-04 MED ORDER — IRBESARTAN 75 MG PO TABS: 75.0000 mg | ORAL_TABLET | Freq: Every day | ORAL | Status: DC

## 2021-05-04 MED ORDER — RIVAROXABAN 20 MG PO TABS: 20.0000 mg | ORAL_TABLET | Freq: Every day | ORAL | Status: DC

## 2021-05-04 MED ORDER — MAGNESIUM HYDROXIDE 400 MG/5ML PO SUSP: 30.0000 mL | Freq: Every evening | ORAL | Status: DC | PRN

## 2021-05-04 MED ORDER — ALBUTEROL SULFATE (2.5 MG/3ML) 0.083% IN NEBU: 2.5000 mg | INHALATION_SOLUTION | RESPIRATORY_TRACT | Status: DC | PRN

## 2021-05-04 MED ORDER — FLUTICASONE PROPIONATE 50 MCG/ACT NA SUSP: 1.0000 | Freq: Every day | NASAL | Status: DC | PRN

## 2021-05-04 MED ORDER — SODIUM CHLORIDE 0.9% FLUSH: 3.0000 mL | INTRAVENOUS | Status: DC | PRN

## 2021-05-04 MED ORDER — FUROSEMIDE 10 MG/ML IJ SOLN
40.0000 mg | Freq: Two times a day (BID) | INTRAMUSCULAR | Status: DC
  Administered 2021-05-04 (×2): 40 mg via INTRAVENOUS
  Filled 2021-05-04 (×2): qty 4

## 2021-05-04 MED ORDER — ASPIRIN EC 81 MG PO TBEC
81.0000 mg | DELAYED_RELEASE_TABLET | Freq: Every day | ORAL | Status: DC
  Administered 2021-05-04: 81 mg via ORAL
  Filled 2021-05-04: qty 1

## 2021-05-04 MED ORDER — INSULIN ASPART 100 UNIT/ML IJ SOLN
0.0000 [IU] | Freq: Three times a day (TID) | INTRAMUSCULAR | Status: DC
  Administered 2021-05-04: 2 [IU] via SUBCUTANEOUS
  Administered 2021-05-04 (×2): 1 [IU] via SUBCUTANEOUS
  Filled 2021-05-04: qty 0.09

## 2021-05-04 MED ORDER — ENOXAPARIN SODIUM 40 MG/0.4ML IJ SOSY
40.0000 mg | PREFILLED_SYRINGE | INTRAMUSCULAR | Status: DC
  Administered 2021-05-04 – 2021-05-06 (×2): 40 mg via SUBCUTANEOUS
  Filled 2021-05-04 (×2): qty 0.4

## 2021-05-04 MED ORDER — TART CHERRY 1200 MG PO CAPS: ORAL_CAPSULE | Freq: Every day | ORAL | Status: DC

## 2021-05-04 MED ORDER — INSULIN GLARGINE 100 UNIT/ML ~~LOC~~ SOLN
25.0000 [IU] | Freq: Every day | SUBCUTANEOUS | Status: DC
  Administered 2021-05-04 – 2021-05-05 (×2): 25 [IU] via SUBCUTANEOUS
  Filled 2021-05-04 (×3): qty 0.25

## 2021-05-04 MED ORDER — SODIUM CHLORIDE 0.9% FLUSH
3.0000 mL | Freq: Two times a day (BID) | INTRAVENOUS | Status: DC
  Administered 2021-05-04 – 2021-05-06 (×4): 3 mL via INTRAVENOUS

## 2021-05-04 MED ORDER — TRAMADOL HCL 50 MG PO TABS
25.0000 mg | ORAL_TABLET | ORAL | Status: DC | PRN
Start: 2021-05-04 — End: 2021-05-06

## 2021-05-04 MED ORDER — CHLORTHALIDONE 25 MG PO TABS
25.0000 mg | ORAL_TABLET | Freq: Every day | ORAL | Status: DC
  Filled 2021-05-04: qty 1

## 2021-05-04 MED ORDER — ACETAMINOPHEN 325 MG PO TABS: 650.0000 mg | ORAL_TABLET | ORAL | Status: DC | PRN

## 2021-05-04 MED ORDER — LIDOCAINE 5 % EX PTCH
1.0000 | MEDICATED_PATCH | CUTANEOUS | Status: DC
  Administered 2021-05-05: 1 via TRANSDERMAL
  Filled 2021-05-04 (×3): qty 1

## 2021-05-04 MED ORDER — POTASSIUM CHLORIDE CRYS ER 10 MEQ PO TBCR
10.0000 meq | EXTENDED_RELEASE_TABLET | Freq: Every day | ORAL | Status: DC
  Administered 2021-05-04: 10 meq via ORAL
  Filled 2021-05-04: qty 1

## 2021-05-04 MED ORDER — ZOLPIDEM TARTRATE 5 MG PO TABS
5.0000 mg | ORAL_TABLET | Freq: Every evening | ORAL | Status: DC | PRN
  Administered 2021-05-05: 5 mg via ORAL
  Filled 2021-05-04: qty 1

## 2021-05-04 NOTE — Progress Notes (Signed)
PROGRESS NOTE    [name] [name]  OMA:[id number] DOB: 1951/11/06 DOA: 05/03/2021 PCP: Raeanne Gathers, MD  Chief Complaint  Patient presents with   Shortness of Breath   Brief Narrative:  [name] [name] is [name] [name] 70 y.o. Caucasian male with medical history significant for multiple medical problems that are mentioned below, who presented to the emergency room with acute onset of generalized weakness over the last couple of days with associated dyspnea as well as orthopnea and paroxysmal nocturnal dyspnea with significantly worsening lower extremity edema for the last few days.  He admitted to dyspnea on exertion.  He denies any fever or chills.  He has been having dry cough and occasional wheezing.  No chest pain or palpitations.  No nausea or vomiting or abdominal pain.  No dysuria, oliguria or hematuria or flank pain.   ED Course: When he came to the ER blood pressure was 136/91 with otherwise normal vital signs.  Labs revealed hypokalemia and Annsleigh Dragoo creatinine of 1.8 with Channon Brougher BUN of 23 compared with Thaxton Pelley creatinine of 1.49compared with Phelan Goers BUN of 27 on 12/04/2020.  CBC was unremarkable.  BNP was 646.8. EKG as reviewed by me : Showed sinus rhythm with rate of 85 with prolonged PR interval, right bundle branch block and left posterior fascicular block. Imaging:  Two-view chest x-ray showed left basilar atelectasis with borderline cardiomegaly and no frank edema or consolidation.  The patient was given 40 mg of IV Lasix and 40 mill equivalent p.o. potassium chloride.  He will be admitted to [facility] telemetry bed for further evaluation and management.  Assessment & Plan:   Active Problems:   Acute on chronic systolic CHF (congestive heart failure) (HCC)  Acute on chronic systolic HF exacerbation Echo 07/2020 with EF 35-40% Appears volume overloaded with edema CXR with L base atelectasis Lasix 40 mg BID Echo pending  Follow LE [place] Cardiology c/s, appreciate recs He was taking lasix only as needed, no longer  taking micardis  Elevated Troponin  New RBBB Echo pending Suspect demand in setting of HF exacerbation Cardiology c/s, appreciate recommendations  History Atrial Fibrillation  He stopped xarelto on his own accord  No AV nodal blocker Cardiology following  Hypokalemia. Replace and follow   Essential hypertension. He's no longer taking micardis Hold chlorthalidone with need for lasix  Vitamin B12 deficiency. We will continue vitamin B12.  Type 2 diabetes mellitus. The patient will be placed on supplement coverage with NovoLog and will continue Glucotrol XL.  Dyslipidemia. We will continue his statin therapy.  Depression. We will continue Zoloft.  DVT prophylaxis: lovenox Code Status: full  Family Communication: none at bedside Disposition:   Status is: Inpatient  Remains inpatient appropriate because:Inpatient level of care appropriate due to severity of illness  Dispo: The patient is from: Home              Anticipated d/c is to: Home              Patient currently is not medically stable to d/c.   Difficult to place patient No       Consultants:  cardiology  Procedures:  Echo pending LE [place] Summary:  RIGHT:  - There is no evidence of deep vein thrombosis in the lower extremity.     - No cystic structure found in the popliteal fossa.  - Pulsatile waveforms suggestive of elevated right heart pressures.     LEFT:  - There is no evidence of deep vein thrombosis in the lower  extremity.     - No cystic structure found in the popliteal fossa.  - Pulsatile waveforms suggestive of elevated right heart pressures.  Antimicrobials:  Anti-infectives (From admission, onward)    None          Subjective: No complaints, notes he's feeling better  Objective: Vitals:   05/04/21 1100 05/04/21 1130 05/04/21 1230 05/04/21 1317  BP: 117/79 112/87 119/80 121/82  Pulse: 77 75 72 75  Resp: 18 (!) 9 16 16   Temp:    97.6 F (36.4 C)  TempSrc:    Oral   SpO2: 97% 94% 96% 99%  Weight:      Height:        Intake/Output Summary (Last 24 hours) at 05/04/2021 1706 Last data filed at 05/04/2021 0654 Gross per 24 hour  Intake --  Output 1400 ml  Net -1400 ml   Filed Weights   05/04/21 0325  Weight: 96.2 kg    Examination:  General exam: Appears calm and comfortable  Respiratory system: Clear to auscultation. Respiratory effort normal. Cardiovascular system: S1 & S2 heard, RRR.  Gastrointestinal system: Abdomen is nondistended, soft and nontender.  Central nervous system: Alert and oriented. No focal neurological deficits. Extremities: bilateral LE edema, trace sacral edema Skin: No rashes, lesions or ulcers Psychiatry: Judgement and insight appear normal. Mood & affect appropriate.     Data Reviewed: I have personally reviewed following labs and imaging studies  CBC: Recent Labs  Lab 05/03/21 2104 05/04/21 0432  WBC 5.8 5.1  NEUTROABS 4.1 3.5  HGB 15.0 14.3  HCT 48.6 45.3  MCV 84.7 83.7  PLT 122* 96*    Basic Metabolic Panel: Recent Labs  Lab 05/03/21 2104 05/04/21 1155  NA 140 140  K 3.0* 2.9*  CL 104 102  CO2 26 29  GLUCOSE 148* 149*  BUN 23 21  CREATININE 1.80* 1.53*  CALCIUM 9.2 9.1  MG  --  1.8    GFR: Estimated Creatinine Clearance: 56.5 mL/min ([name] [name]) (by C-G formula based on SCr of 1.53 mg/dL (H)).  Liver Function Tests: Recent Labs  Lab 05/04/21 1155  AST 23  ALT 13  ALKPHOS 84  BILITOT 2.5*  PROT 6.6  ALBUMIN 3.6    CBG: Recent Labs  Lab 05/04/21 0810 05/04/21 1141  GLUCAP 126* 142*     Recent Results (from the past 240 hour(s))  Resp Panel by RT-PCR (Flu Rhianon Zabawa&B, Covid) Nasopharyngeal Swab     Status: None   Collection Time: 05/04/21  1:36 AM   Specimen: Nasopharyngeal Swab; Nasopharyngeal(NP) swabs in vial transport medium  Result Value Ref Range Status   SARS Coronavirus 2 by RT PCR NEGATIVE NEGATIVE Final    Comment: (NOTE) SARS-CoV-2 target nucleic acids are NOT  DETECTED.  The SARS-CoV-2 RNA is generally detectable in upper respiratory specimens during the acute phase of infection. The lowest concentration of SARS-CoV-2 viral copies this assay can detect is 138 copies/mL. [name] [name] negative result does not preclude SARS-Cov-2 infection and should not be used as the sole basis for treatment or other patient management decisions. [name] [name] negative result may occur with  improper specimen collection/handling, submission of specimen other than nasopharyngeal swab, presence of viral mutation(s) within the areas targeted by this assay, and inadequate number of viral copies(<138 copies/mL). [name] [name] negative result must be combined with clinical observations, patient history, and epidemiological information. The expected result is Negative.  Fact Sheet for Patients:  EntrepreneurPulse.com.au  Fact Sheet for Healthcare Providers:  IncredibleEmployment.be  This test is  no t yet approved or cleared by the [place] and  has been authorized for detection and/or diagnosis of SARS-CoV-2 by FDA under an Emergency Use Authorization (EUA). This EUA will remain  in effect (meaning this test can be used) for the duration of the COVID-19 declaration under Section 564(b)(1) of the Act, 21 U.S.C.section 360bbb-3(b)(1), unless the authorization is terminated  or revoked sooner.       Influenza Tiffani Kadow by PCR NEGATIVE NEGATIVE Final   Influenza B by PCR NEGATIVE NEGATIVE Final    Comment: (NOTE) The Xpert Xpress SARS-CoV-2/FLU/RSV plus assay is intended as an aid in the diagnosis of influenza from Nasopharyngeal swab specimens and should not be used as [name] [name] sole basis for treatment. Nasal washings and aspirates are unacceptable for Xpert Xpress SARS-CoV-2/FLU/RSV testing.  Fact Sheet for Patients: EntrepreneurPulse.com.au  Fact Sheet for Healthcare Providers: IncredibleEmployment.be  This test is not yet  approved or cleared by the [place] FDA and has been authorized for detection and/or diagnosis of SARS-CoV-2 by FDA under an Emergency Use Authorization (EUA). This EUA will remain in effect (meaning this test can be used) for the duration of the COVID-19 declaration under Section 564(b)(1) of the Act, 21 U.S.C. section 360bbb-3(b)(1), unless the authorization is terminated or revoked.  Performed at [facility], [place] [address], [place], [place] [address]          Radiology Studies: DG Chest 2 View  Result Date: 05/03/2021 CLINICAL DATA:  Shortness of breath EXAM: CHEST - 2 VIEW COMPARISON:  August 17, 2016 FINDINGS: There is mild left base atelectasis. The lungs elsewhere are clear. Heart is borderline enlarged with pulmonary vascularity normal. No adenopathy. No bone lesions. IMPRESSION: Left base atelectasis. No frank edema or consolidation. Heart borderline enlarged. Electronically Signed   By: Lowella Grip III M.D.   On: 05/03/2021 21:09   VAS [place] LOWER EXTREMITY VENOUS (DVT)  Result Date: 05/04/2021  Lower Venous DVT Study Patient Name:  [name] [name]  Date of Exam:   05/04/2021 Medical Rec #: [id number]           Accession #:    [id number] Date of Birth: [birth year]           Patient Gender: M Patient Age:   70Y Exam Location:  [facility] Procedure:      VAS [place] LOWER EXTREMITY VENOUS (DVT) Referring Phys: [id number] Irys Nigh CALDWELL POWELL JR --------------------------------------------------------------------------------  Indications: Edema.  Comparison Study: No prior studies. Performing Technologist: Darlin Coco RDMS,RVT  Examination Guidelines: Demarques Pilz complete evaluation includes B-mode imaging, spectral Doppler, color Doppler, and power Doppler as needed of all accessible portions of each vessel. Bilateral testing is considered an integral part of Raguel Kosloski complete examination. Limited examinations for reoccurring indications may be performed as noted. The  reflux portion of the exam is performed with the patient in reverse Trendelenburg.  +---------+---------------+---------+-----------+----------+--------------+ RIGHT    CompressibilityPhasicitySpontaneityPropertiesThrombus Aging +---------+---------------+---------+-----------+----------+--------------+ CFV      Full           No       Yes                                 +---------+---------------+---------+-----------+----------+--------------+ SFJ      Full                                                        +---------+---------------+---------+-----------+----------+--------------+  FV Prox  Full                                                        +---------+---------------+---------+-----------+----------+--------------+ FV Mid   Full                                                        +---------+---------------+---------+-----------+----------+--------------+ FV DistalFull                                                        +---------+---------------+---------+-----------+----------+--------------+ PFV      Full                                                        +---------+---------------+---------+-----------+----------+--------------+ POP      Full           No       Yes                                 +---------+---------------+---------+-----------+----------+--------------+ PTV      Full                                                        +---------+---------------+---------+-----------+----------+--------------+ PERO     Full                                                        +---------+---------------+---------+-----------+----------+--------------+   +---------+---------------+---------+-----------+----------+--------------+ LEFT     CompressibilityPhasicitySpontaneityPropertiesThrombus Aging +---------+---------------+---------+-----------+----------+--------------+ CFV      Full           No        Yes                                 +---------+---------------+---------+-----------+----------+--------------+ SFJ      Full                                                        +---------+---------------+---------+-----------+----------+--------------+ FV Prox  Full                                                        +---------+---------------+---------+-----------+----------+--------------+  FV Mid   Full                                                        +---------+---------------+---------+-----------+----------+--------------+ FV DistalFull                                                        +---------+---------------+---------+-----------+----------+--------------+ PFV      Full                                                        +---------+---------------+---------+-----------+----------+--------------+ POP      Full           No       Yes                                 +---------+---------------+---------+-----------+----------+--------------+ PTV      Full                                                        +---------+---------------+---------+-----------+----------+--------------+ PERO     Full                                                        +---------+---------------+---------+-----------+----------+--------------+     Summary: RIGHT: - There is no evidence of deep vein thrombosis in the lower extremity.  - No cystic structure found in the popliteal fossa. - Pulsatile waveforms suggestive of elevated right heart pressures.  LEFT: - There is no evidence of deep vein thrombosis in the lower extremity.  - No cystic structure found in the popliteal fossa. - Pulsatile waveforms suggestive of elevated right heart pressures.  *See table(s) above for measurements and observations.    Preliminary         Scheduled Meds:  [START ON 05/05/2021] aspirin  81 mg Oral Pre-Cath   aspirin EC  81 mg Oral QHS   cyanocobalamin   1,000 mcg Intramuscular Q30 days   enoxaparin (LOVENOX) injection  40 mg Subcutaneous Q24H   furosemide  40 mg Intravenous Q12H   insulin aspart  0-9 Units Subcutaneous TID AC & HS   insulin glargine  25 Units Subcutaneous Q2200   lidocaine  1 patch Transdermal Q24H   potassium chloride  10 mEq Oral QHS   potassium chloride  40 mEq Oral Once   rosuvastatin  20 mg Oral QHS   sodium chloride flush  3 mL Intravenous Q12H   sodium chloride flush  3 mL Intravenous Q12H   Continuous Infusions:  sodium chloride       LOS: 0 days  Time spent: over 30 min    Fayrene Helper, MD [facility]   To contact the attending provider between 7A-7P or the covering provider during after hours 7P-7A, please log into the web site [email or web address] and access using universal [place] password for that web site. If you do not have the password, please call the hospital operator.  05/04/2021, 5:06 PM

## 2021-05-04 NOTE — ED Provider Notes (Signed)
[facility] DEPT Provider Note   CSN: [id number] Arrival date & time: 05/03/21  1959     History Chief Complaint  Patient presents with   Shortness of Breath    [name] [name] is a 70 y.o. male.  Patient presents to the emergency department for evaluation of shortness of breath.  Patient reports that he has noticed progressively worsening shortness of breath and severe dyspnea on exertion over the past week.  In this time.  He has noticed increased swelling of his legs.  Patient reports that when he tries to walk short distances he gets very out of breath and and feels dizzy like he is going to pass out.  No associated chest pain.      Past Medical History:  Diagnosis Date   Arthritis of left knee 12/02/2020   Bilateral leg edema 05/04/2017   CAD (coronary artery disease)    nonST elevated MI in Oct 2008 treated w/2 drug -eluting stents to the RCA and  mid LAD, EF 55%   CAD (coronary artery disease), native coronary artery 11/12/2018   Formatting of this note might be different from the original. S/p MI - Dr. 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 Hypertension  10/1 IMO update   Gout, unspecified 10/14/2007   Overview:  Gout  10/1 IMO update  Formatting of this note might be different from the original. Gout  10/1 IMO update   History of colonic polyps 11/12/2018   Overview:  rpt colonoscopy 5/17; Dr. Haig Prophet of this note might be different from the original. rpt colonoscopy 5/17; Dr. Erlene Quan    History of kidney stones    History of nephrolithiasis 11/12/2018   History of smoking 11/12/2018   Overview:  quit 2000  Formatting of this note might be different from the original. quit 2000   HTN (hypertension)    Hyperlipidemia    HYPERLIPIDEMIA-MIXED 10/14/2007   Qualifier: Diagnosis of  By: Haroldine Laws, MD, Willette Cluster of this note might be different from the original. Hyperlipidemia   HYPERTENSION, BENIGN 06/15/2009   Inguinal hernia without mention of obstruction or gangrene, recurrent unilateral or unspecified 11/12/2018   Ischemic cardiomyopathy 08/12/2020   Medication intolerance 11/12/2018   Mixed hyperlipidemia 11/12/2018   Old myocardial infarction 11/12/2018   Paroxysmal atrial fibrillation ([place]) 07/16/2020   Pre-operative cardiovascular examination 11/12/2018   Primary osteoarthritis of both knees 03/05/2017   Considering Zilretta injections. Good improvement with corticosteroid injections however short-lived. Mild improvement with Visco supplementation.   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   Arthritis of left knee 12/02/2020   Renal insufficiency 08/12/2020   Ischemic cardiomyopathy 08/12/2020   CAD (coronary artery disease)    DM (diabetes mellitus) ([place])    HTN (hypertension)    Hyperlipidemia    Diabetes mellitus due to underlying condition with unspecified complications ([place]) 75/08/2584   Paroxysmal atrial  fibrillation ([place]) 07/16/2020   Erythrocytosis 06/01/2020   Unilateral primary osteoarthritis, right knee 11/15/2018   Status post total right knee replacement 11/15/2018   Depressive disorder 11/12/2018   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   History of colonic polyps 11/12/2018   History of nephrolithiasis 11/12/2018   History of smoking 11/12/2018   Inguinal hernia without mention of obstruction or gangrene, recurrent unilateral or unspecified 11/12/2018   Medication intolerance 11/12/2018   Old myocardial infarction 11/12/2018   Mixed hyperlipidemia 11/12/2018   Pre-operative cardiovascular examination 11/12/2018   CAD (coronary artery disease), native coronary artery 11/12/2018   Bilateral leg edema 05/04/2017   Primary osteoarthritis of both knees 03/05/2017   CAP (community acquired pneumonia) 08/17/2016   Right carotid bruit 08/05/2014   HYPERTENSION, BENIGN 06/15/2009   CAD, NATIVE VESSEL 06/15/2009   Type II or unspecified type diabetes mellitus without mention of complication, not stated as uncontrolled 10/14/2007   Gout, unspecified 10/14/2007   Essential hypertension 10/14/2007    Past Surgical History:  Procedure Laterality Date   BILATERAL KNEE ARTHROSCOPY     CORONARY ANGIOPLASTY WITH STENT PLACEMENT     LAPAROSCOPIC INGUINAL HERNIA REPAIR     NOSE SURGERY     TOTAL KNEE ARTHROPLASTY Right 11/15/2018   Procedure: RIGHT TOTAL KNEE ARTHROPLASTY;  Surgeon: Mcarthur Rossetti, MD;  Location: [facility];  Service: Orthopedics;  Laterality: Right;   TOTAL KNEE ARTHROPLASTY Left 12/03/2020   Procedure: LEFT TOTAL KNEE ARTHROPLASTY;  Surgeon: Mcarthur Rossetti, MD;  Location: [facility];  Service: Orthopedics;  Laterality: Left;       Family History  Problem Relation Age of Onset   Hypertension Mother    Cancer Mother    Heart attack Father    Hypertension Father    Diabetes Father    Hypertension Brother    Irregular heart beat Brother      Social History   Tobacco Use   Smoking status: Former    Packs/day: 0.50    Years: 10.00    Pack years: 5.00    Types: Cigarettes   Smokeless tobacco: Former    Types: Snuff, Chew    Quit date: 11/20/1968   Tobacco comments:    in his 20's  Vaping Use   Vaping Use: Never used  Substance Use Topics   Alcohol use: No   Drug use: No    Home Medications Prior to Admission medications   Medication Sig Start Date End Date Taking? Authorizing Provider  ALPRAZolam Duanne Moron) 0.25 MG tablet Take 0.25 mg by mouth at bedtime as needed for anxiety.  11/27/16   [provider]  aspirin 81 MG tablet Take 81 mg by mouth at bedtime.    [provider]  chlorthalidone (HYGROTON) 25 MG tablet Take 25 mg by mouth daily in the afternoon. 10/08/20   [provider]  CIALIS 20 MG tablet Take 20 mg by mouth daily as needed for erectile dysfunction.  02/06/17   [provider]  cyanocobalamin (,VITAMIN B-12,) 1000 MCG/ML injection Inject 1,000 mcg into the muscle every 30 (thirty) days.  05/12/16  [provider]  doxycycline (VIBRA-TABS) 100 MG tablet Take 1 tablet (100 mg total) by mouth 2 (two) times daily. 12/30/20   Pete Pelt, PA-C  fluticasone (FLONASE) 50 MCG/ACT nasal spray Place 1 spray into both nostrils daily as needed for allergies or rhinitis.    [provider]  glipiZIDE (GLUCOTROL XL) 10 MG 24 hr tablet Take 10 mg by mouth daily with breakfast.    [provider]  HYDROmorphone (DILAUDID) 2 MG tablet Take 1 tablet (2 mg total) by mouth every 4 (four) hours as needed for severe pain. 12/16/20   Mcarthur Rossetti, MD  LANTUS SOLOSTAR 100 UNIT/ML Solostar Pen Inject 25 Units into the skin at bedtime. 03/14/20   [provider]  levalbuterol (XOPENEX HFA) 45 MCG/ACT inhaler Inhale 1 puff into the lungs every 4 (four) hours as needed for wheezing.    [provider]  lidocaine (LIDODERM) 5 % PLACE 1 PATCH  ONTO THE SKIN DAILY. REMOVE & DISCARD PATCH WITHIN 12 HOURS OR AS DIRECTED BY MD 02/04/21   Pete Pelt, PA-C  Potassium 99 MG TABS Take 99 mg by mouth at bedtime.    [provider]  rivaroxaban (XARELTO) 20 MG TABS tablet Take 1 tablet (20 mg total) by mouth daily with supper. 09/22/20   Revankar, Reita Cliche, MD  rosuvastatin (CRESTOR) 20 MG tablet Take 20 mg by mouth at bedtime.    [provider]  TART CHERRY PO Take 1 tablet by mouth at bedtime.    [provider]  telmisartan (MICARDIS) 80 MG tablet Take 40 mg by mouth at bedtime. 09/13/20   [provider]  traMADol (ULTRAM) 50 MG tablet Take 25 mg by mouth every 4 (four) hours as needed for pain or severe pain. 11/13/20   [provider]    Allergies    Allopurinol, Aripiprazole, Bupropion, Buspirone, Desvenlafaxine, Duloxetine, Escitalopram, Fish oil, Fluticasone furoate, Hydroxyzine hcl, Nortriptyline, Paroxetine hcl, Ramipril, Sertraline, Venlafaxine, and Vilazodone  Review of Systems   Review of Systems  Respiratory:  Positive for shortness of breath.   Cardiovascular:  Positive for leg swelling. Negative for chest pain.  Neurological:  Positive for dizziness.  All other systems reviewed and are negative.  Physical Exam Updated Vital Signs BP 123/89   Pulse 77   Temp 98.5 F (36.9 C) (Oral)   Resp 18   SpO2 96%   Physical Exam Vitals and nursing note reviewed.  Constitutional:      General: He is not in acute distress.    Appearance: Normal appearance. He is well-developed.  HENT:     Head: Normocephalic and atraumatic.     Right Ear: Hearing normal.     Left Ear: Hearing normal.     Nose: Nose normal.  Eyes:     Conjunctiva/sclera: Conjunctivae normal.     Pupils: Pupils are equal, round, and reactive to light.  Cardiovascular:     Rate and Rhythm: Regular rhythm.     Heart sounds: S1 normal and S2 normal. No murmur heard.   No friction rub. No gallop.  Pulmonary:      Effort: Pulmonary effort is normal. No respiratory distress.     Breath sounds: Normal breath sounds.  Chest:     Chest wall: No tenderness.  Abdominal:     General: Bowel sounds are normal.     Palpations: Abdomen is soft.     Tenderness: There is no abdominal tenderness. There is no guarding or rebound.  Negative signs include Murphy's sign and McBurney's sign.     Hernia: No hernia is present.  Musculoskeletal:        General: Normal range of motion.     Cervical back: Normal range of motion and neck supple.     Right lower leg: Edema present.     Left lower leg: Edema present.  Skin:    General: Skin is warm and dry.     Findings: No rash.  Neurological:     Mental Status: He is alert and oriented to person, place, and time.     GCS: GCS eye subscore is 4. GCS verbal subscore is 5. GCS motor subscore is 6.     Cranial Nerves: No cranial nerve deficit.     Sensory: No sensory deficit.     Coordination: Coordination normal.  Psychiatric:        Speech: Speech normal.        Behavior: Behavior normal.        Thought Content: Thought content normal.    ED Results / Procedures / Treatments   Labs (all labs ordered are listed, but only abnormal results are displayed) Labs Reviewed  BASIC METABOLIC PANEL - Abnormal; Notable for the following components:      Result Value   Potassium 3.0 (*)    Glucose, Bld 148 (*)    Creatinine, Ser 1.80 (*)    GFR, Estimated 40 (*)    All other components within normal limits  BRAIN NATRIURETIC PEPTIDE - Abnormal; Notable for the following components:   B Natriuretic Peptide 646.8 (*)    All other components within normal limits  CBC WITH DIFFERENTIAL/PLATELET - Abnormal; Notable for the following components:   Platelets 122 (*)    All other components within normal limits    EKG None  Radiology DG Chest 2 View  Result Date: 05/03/2021 CLINICAL DATA:  Shortness of breath EXAM: CHEST - 2 VIEW COMPARISON:  August 17, 2016  FINDINGS: There is mild left base atelectasis. The lungs elsewhere are clear. Heart is borderline enlarged with pulmonary vascularity normal. No adenopathy. No bone lesions. IMPRESSION: Left base atelectasis. No frank edema or consolidation. Heart borderline enlarged. Electronically Signed   By: Lowella Grip III M.D.   On: 05/03/2021 21:09    Procedures Procedures   Medications Ordered in ED Medications - No data to display  ED Course  I have reviewed the triage vital signs and the nursing notes.  Pertinent labs & imaging results that were available during my care of the patient were reviewed by me and considered in my medical decision making (see chart for details).    MDM Rules/Calculators/A&P                          Patient with exertional shortness of breath, orthopnea and paroxysmal nocturnal dyspnea.  He has significant swelling of his legs which he reports is new.  Patient has noticed over the past week that his breathing has become much more labored when he tries to walk short distances.  With minimal exertion he also becomes dizzy and feels like he is going to pass out.  Patient does have a history of CAD, 2 stents in 2008.  No concomitant chest pain.  Reviewing records reveals he did have an echo in September of last year that showed an ejection fraction of 35 to 40%.  I would be worried that this has worsened.  Patient does have  a prescription for Lasix but he only uses that intermittently as needed for swelling.  He likely will require extensive diuresis and does require repeat echo.  Will admit to the hospital for further management.  Final Clinical Impression(s) / ED Diagnoses Final diagnoses:  Acute on chronic congestive heart failure, unspecified heart failure type [facility])    Rx / DC Orders ED Discharge Orders     None        Kahil Agner, Gwenyth Allegra, MD 05/04/21 0134

## 2021-05-04 NOTE — ED Notes (Signed)
Patient is currently waiting for admission bed.  No distress noted when checked frequently.  Patient is talking on the phone

## 2021-05-04 NOTE — Progress Notes (Signed)
Lower extremity venous bilateral study completed.   Please see CV Proc for preliminary results.   Quade Ramirez, RDMS, RVT  

## 2021-05-04 NOTE — ED Notes (Signed)
Cardiology PA is at the bedside

## 2021-05-04 NOTE — ED Notes (Signed)
Ardeen Garland, RN was notified of the room assignment and patient admission

## 2021-05-04 NOTE — H&P (Addendum)
[place]   PATIENT NAME: [name] [name]    MR#:  [id number]  DATE OF BIRTH:  17-Feb-1951  DATE OF ADMISSION:  05/03/2021  PRIMARY CARE PHYSICIAN: Raeanne Gathers, MD   Patient is coming from: Home  REQUESTING/REFERRING PHYSICIAN: Orpah Greek, MD   CHIEF COMPLAINT:   Chief Complaint  Patient presents with   Shortness of Breath    HISTORY OF PRESENT ILLNESS:  [name] [name] is a 70 y.o. Caucasian male with medical history significant for multiple medical problems that are mentioned below, who presented to the emergency room with acute onset of generalized weakness over the last couple of days with associated dyspnea as well as orthopnea and paroxysmal nocturnal dyspnea with significantly worsening lower extremity edema for the last few days.  He admitted to dyspnea on exertion.  He denies any fever or chills.  He has been having dry cough and occasional wheezing.  No chest pain or palpitations.  No nausea or vomiting or abdominal pain.  No dysuria, oliguria or hematuria or flank pain.  ED Course: When he came to the ER blood pressure was 136/91 with otherwise normal vital signs.  Labs revealed hypokalemia and a creatinine of 1.8 with a BUN of 23 compared with a creatinine of 1.49compared with a BUN of 27 on 12/04/2020.  CBC was unremarkable.  BNP was 646.8. EKG as reviewed by me : Showed sinus rhythm with rate of 85 with prolonged PR interval, right bundle branch block and left posterior fascicular block. Imaging:  Two-view chest x-ray showed left basilar atelectasis with borderline cardiomegaly and no frank edema or consolidation.  The patient was given 40 mg of IV Lasix and 40 mill equivalent p.o. potassium chloride.  He will be admitted to a telemetry bed for further evaluation and management. PAST MEDICAL HISTORY:   Past Medical History:  Diagnosis Date   Arthritis of left knee 12/02/2020   Bilateral leg edema 05/04/2017   CAD (coronary artery disease)     nonST elevated MI in Oct 2008 treated w/2 drug -eluting stents to the RCA and  mid LAD, EF 55%   CAD (coronary artery disease), native coronary artery 11/12/2018   Formatting of this note might be different from the original. S/p MI - Dr. 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 Hypertension  10/1 IMO update   Gout, unspecified 10/14/2007   Overview:  Gout  10/1 IMO update  Formatting of this note might be different from the original. Gout  10/1 IMO update   History of colonic polyps 11/12/2018   Overview:  rpt colonoscopy 5/17; Dr. Haig Prophet of this note might be different from the original. rpt colonoscopy 5/17; Dr. Erlene Quan   History of kidney stones    History of nephrolithiasis 11/12/2018   History of smoking 11/12/2018   Overview:  quit 2000  Formatting of this note might be different from the original. quit 2000   HTN (hypertension)    Hyperlipidemia    HYPERLIPIDEMIA-MIXED 10/14/2007   Qualifier: Diagnosis of  By: Haroldine Laws,  MD, Willette Cluster of this note might be different from the original. Hyperlipidemia   HYPERTENSION, BENIGN 06/15/2009   Inguinal hernia without mention of obstruction or gangrene, recurrent unilateral or unspecified 11/12/2018   Ischemic cardiomyopathy 08/12/2020   Medication intolerance 11/12/2018   Mixed hyperlipidemia 11/12/2018   Old myocardial infarction 11/12/2018   Paroxysmal atrial fibrillation ([place]) 07/16/2020   Pre-operative cardiovascular examination 11/12/2018   Primary osteoarthritis of  both knees 03/05/2017   Considering Zilretta injections. Good improvement with corticosteroid injections however short-lived. Mild improvement with Visco supplementation.   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 Diabetes Mellitus   Unilateral primary osteoarthritis, right knee 11/15/2018    PAST SURGICAL HISTORY:   Past Surgical History:  Procedure Laterality Date   BILATERAL KNEE ARTHROSCOPY     CORONARY ANGIOPLASTY WITH STENT PLACEMENT     LAPAROSCOPIC INGUINAL HERNIA REPAIR     NOSE SURGERY     TOTAL KNEE ARTHROPLASTY Right 11/15/2018   Procedure: RIGHT TOTAL KNEE ARTHROPLASTY;  Surgeon: Mcarthur Rossetti, MD;  Location: [facility];  Service: Orthopedics;  Laterality: Right;   TOTAL KNEE ARTHROPLASTY Left 12/03/2020   Procedure: LEFT TOTAL KNEE ARTHROPLASTY;  Surgeon: Mcarthur Rossetti, MD;  Location: [facility];  Service: Orthopedics;  Laterality: Left;    SOCIAL HISTORY:   Social History   Tobacco Use   Smoking status: Former    Packs/day: 0.50    Years: 10.00    Pack years: 5.00    Types: Cigarettes   Smokeless tobacco: Former    Types: Snuff, Chew    Quit date: 11/20/1968   Tobacco comments:    in his 20's  Substance Use Topics   Alcohol use: No    FAMILY HISTORY:   Family History  Problem Relation Age of Onset   Hypertension Mother    Cancer Mother    Heart attack Father    Hypertension Father    Diabetes Father    Hypertension Brother    Irregular heart beat Brother     DRUG ALLERGIES:   Allergies  Allergen Reactions   Allopurinol Other (See Comments)    Felt terrible   Aripiprazole Other (See Comments)    Felt terrible    Bupropion Other (See Comments)    Felt bad   Buspirone Other (See Comments)    Felt bad   Desvenlafaxine Other (See Comments)    Made him hyper   Duloxetine Other (See Comments)    Knocked him out   Escitalopram Other (See Comments)    Felt like crap   Fish Oil     unknown   Fluticasone Furoate Other (See Comments)    Unknown    Hydroxyzine Hcl Other (See Comments)    Knocked him out   Nortriptyline Other (See Comments)    Ineffective   Paroxetine Hcl Other (See Comments)    unknown   Ramipril Cough   Sertraline Other (See Comments)    "felt plugged in"   Venlafaxine Other (See Comments)    Felt bad   Vilazodone Other (See Comments)    "felt weird"    REVIEW OF SYSTEMS:   ROS As per history  of present illness. All pertinent systems were reviewed above. Constitutional, HEENT, cardiovascular, respiratory, GI, GU, musculoskeletal, neuro, psychiatric, endocrine, integumentary and hematologic systems were reviewed and are otherwise negative/unremarkable except for positive findings mentioned above in the HPI.   MEDICATIONS AT HOME:   Prior to Admission medications   Medication Sig Start Date End Date Taking? Authorizing Provider  ALPRAZolam Duanne Moron) 0.25 MG tablet Take 0.25 mg by mouth at bedtime as needed for anxiety.  11/27/16   [provider]  aspirin 81 MG tablet Take 81 mg by mouth at bedtime.    [provider]  chlorthalidone (HYGROTON) 25 MG tablet Take 25 mg by mouth daily in the afternoon. 10/08/20   [provider]  CIALIS 20 MG tablet Take 20 mg by mouth daily as needed for erectile dysfunction.  02/06/17   [provider]  cyanocobalamin (,VITAMIN B-12,) 1000 MCG/ML injection Inject 1,000 mcg into the muscle every 30 (thirty) days.  05/12/16   [provider]  doxycycline (VIBRA-TABS) 100 MG tablet Take 1 tablet (100 mg total) by mouth 2 (two) times daily. 12/30/20   Pete Pelt, PA-C  fluticasone (FLONASE) 50 MCG/ACT  nasal spray Place 1 spray into both nostrils daily as needed for allergies or rhinitis.    [provider]  glipiZIDE (GLUCOTROL XL) 10 MG 24 hr tablet Take 10 mg by mouth daily with breakfast.    [provider]  HYDROmorphone (DILAUDID) 2 MG tablet Take 1 tablet (2 mg total) by mouth every 4 (four) hours as needed for severe pain. 12/16/20   Mcarthur Rossetti, MD  LANTUS SOLOSTAR 100 UNIT/ML Solostar Pen Inject 25 Units into the skin at bedtime. 03/14/20   [provider]  levalbuterol (XOPENEX HFA) 45 MCG/ACT inhaler Inhale 1 puff into the lungs every 4 (four) hours as needed for wheezing.    [provider]  lidocaine (LIDODERM) 5 % PLACE 1 PATCH ONTO THE SKIN DAILY. REMOVE & DISCARD PATCH WITHIN 12 HOURS OR AS DIRECTED BY MD 02/04/21   Pete Pelt, PA-C  Potassium 99 MG TABS Take 99 mg by mouth at bedtime.    [provider]  rivaroxaban (XARELTO) 20 MG TABS tablet Take 1 tablet (20 mg total) by mouth daily with supper. 09/22/20   Revankar, Reita Cliche, MD  rosuvastatin (CRESTOR) 20 MG tablet Take 20 mg by mouth at bedtime.    [provider]  TART CHERRY PO Take 1 tablet by mouth at bedtime.    [provider]  telmisartan (MICARDIS) 80 MG tablet Take 40 mg by mouth at bedtime. 09/13/20   [provider]  traMADol (ULTRAM) 50 MG tablet Take 25 mg by mouth every 4 (four) hours as needed for pain or severe pain. 11/13/20   [provider]      VITAL SIGNS:  Blood pressure 123/89, pulse 77, temperature 98.5 F (36.9 C), temperature source Oral, resp. rate 18, SpO2 96 %.  PHYSICAL EXAMINATION:  Physical Exam  GENERAL:  70 y.o.-year-old Caucasian male patient semisitting in the bed with mild respiratory distress with conversational dyspnea EYES: Pupils equal, round, reactive to light and accommodation. No scleral icterus. Extraocular muscles intact.  HEENT: Head atraumatic, normocephalic. Oropharynx and  nasopharynx clear.  NECK:  Supple, no jugular venous distention. No thyroid enlargement, no tenderness.  LUNGS: Slightly diminished bibasal breath sounds. CARDIOVASCULAR: Regular rate and rhythm, S1, S2 normal. No murmurs, rubs, or gallops.  ABDOMEN: Soft, nondistended, nontender. Bowel sounds present. No organomegaly  or mass.  EXTREMITIES: 2-3+ bilateral lower extremity pitting edema with no cyanosis, or clubbing.  NEUROLOGIC: Cranial nerves II through XII are intact. Muscle strength 5/5 in all extremities. Sensation intact. Gait not checked.  PSYCHIATRIC: The patient is alert and oriented x 3.  Normal affect and good eye contact. SKIN: No obvious rash, lesion, or ulcer.   LABORATORY PANEL:   CBC Recent Labs  Lab 05/03/21 2104  WBC 5.8  HGB 15.0  HCT 48.6  PLT 122*   ------------------------------------------------------------------------------------------------------------------  Chemistries  Recent Labs  Lab 05/03/21 2104  NA 140  K 3.0*  CL 104  CO2 26  GLUCOSE 148*  BUN 23  CREATININE 1.80*  CALCIUM 9.2   ------------------------------------------------------------------------------------------------------------------  Cardiac Enzymes No results for input(s): TROPONINI in the last 168 hours. ------------------------------------------------------------------------------------------------------------------  RADIOLOGY:  DG Chest 2 View  Result Date: 05/03/2021 CLINICAL DATA:  Shortness of breath EXAM: CHEST - 2 VIEW COMPARISON:  August 17, 2016 FINDINGS: There is mild left base atelectasis. The lungs elsewhere are clear. Heart is borderline enlarged with pulmonary vascularity normal. No adenopathy. No bone lesions. IMPRESSION: Left base atelectasis. No frank edema or consolidation. Heart borderline enlarged. Electronically Signed   By: Lowella Grip III M.D.   On: 05/03/2021 21:09      IMPRESSION AND PLAN:  Active Problems:   Acute on chronic systolic CHF  (congestive heart failure) ([place])  1.  Acute on chronic systolic CHF. - The patient will be admitted to a telemetry bed. - We will continue diuresis with IV Lasix. - The patient had a 2D echo last September that revealed an EF of 35 to 40% with moderate severe left atrial dilatation, mild mitral regurgitation, mild to moderate dilatation of the ascending aorta.  Diastolic function could not be evaluated. - We will follow serial troponin I's. - We will continue Toprol-XL and Micardis. - The patient had hypotension previously with Entresto. - Cardiology consult can be called in a.m. depending on his progress.  2.  Hypokalemia. - Potassium will will be replaced and magnesium level will be checked.  3.  Essential hypertension. - We will continue amlodipine, Micardis and Toprol-XL.  4.  Vitamin B12 deficiency. - We will continue vitamin B12.  5.  Type 2 diabetes mellitus. - The patient will be placed on supplement coverage with NovoLog and will continue Glucotrol XL.  6.  Dyslipidemia. - We will continue his statin therapy.  7.  Depression. - We will continue Zoloft.  DVT prophylaxis: We will continue Xarelto. Code Status: full code. Family Communication:  The plan of care was discussed in details with the patient (and family). I answered all questions. The patient agreed to proceed with the above mentioned plan. Further management will depend upon hospital course. Disposition Plan: Back to previous home environment Consults called: none. All the records are reviewed and case discussed with ED provider.  Status is: Inpatient  Remains inpatient appropriate because:Ongoing diagnostic testing needed not appropriate for outpatient work up, Unsafe d/c plan, IV treatments appropriate due to intensity of illness or inability to take PO, and Inpatient level of care appropriate due to severity of illness  Dispo: The patient is from: Home              Anticipated d/c is to: Home               Patient currently is not medically stable to d/c.   Difficult to place patient No   TOTAL TIME TAKING CARE OF THIS  PATIENT: 55 minutes.    Christel Mormon M.D on 05/04/2021 at 2:48 AM  [facility]   From 7 PM-7 AM, contact night-coverage [email or web address]  CC: Primary care physician; Raeanne Gathers, MD

## 2021-05-04 NOTE — ED Notes (Signed)
Patient to room 412

## 2021-05-04 NOTE — Consult Note (Addendum)
Cardiology Consultation:   Patient ID: [name] [name] MRN: [id number]; DOB: 1951-02-09  Admit date: 05/03/2021 Date of Consult: 05/04/2021  PCP:  Raeanne Gathers, MD   [facility] Cardiologist:  Jenean Lindau, MD        Patient Profile:   [name] [name] is a 70 y.o. male with a hx of 3 vessel disease CAD, NSTEMI 2009 with PCI of RCA with 2 lapping DES, HTN, HLD, DM  who is being seen 05/04/2021 for the evaluation of CHF and elevated troponin  at the request of Dr. Florene Glen.  History of Present Illness:   [name] [name] with CAD and MI in 2009 and rec'd 2 overlapping DES stents (promus) to RCA.  Other disease included 40% LM mid and distal lesions.  LAD with 70% lesion in midsection after 1st diag, and 90% stenosis within that section as well.  LCX small but patent.  Mid AV groove with 50% tubular lesion.  RCA totally occluded prox and calcification in med section. With PCI as above.  Last nuc study 07/2020 with decreased EF to < 30%, findings of prior MI, no ischemia.  Echo was done and EF 35-40% (EF in 2009 was 55% by echo).  He has had PAF on xarelto and is post stroke though he tells me his xarelto was stopped a month ago and he is on plavix.  Pt's diabetes followed by his PCP and pt has not yet agreed to [place].  Followed by hematology in [place] with erythrocytosis and thrombocytopenia. Last phlebotomy in April this year.    Pt presented to ER 05/03/21 for SOB/DOE for about a week.along with generalized weakness.  Increase of lower ext edema.  He was doing well until last few days.  DOE and even at night SOB, propping up to sleep or waking and having to sit up due to SOB.  No chest tightness or pain. No nausea or vomiting.  No syncope or dizziness.  He has prn lasix and did not take, he wasn't sure really what to do.  His edema was significant.         EKG:  The EKG was personally reviewed and demonstrates:  initially PVC and then with faster reate and unsure P wave  then to SR with 1st degree AV block  RBBB and LPFB  new RBBB and new P wave abnormality in 2021 appears to be a fib on EKG  follow up EKG SR with 1st degree AV block - RBBB and LPFB  Telemetry:  Telemetry was personally reviewed and demonstrates:  SR with 1st degree though at times difficult to see p waves.  BNP 646  HS troponin 176 Na 140 , K+ 3. 0 Cr 1.80 , GFR 40  Hgb 14.3, WBC 5.1 plts 96 was 122 on admit (has been 144 or less since 2019)  Pt rec'd 44 kdur yesterday His xarelto was changed to Lovenox VTE prophylaxis   CXR 2 V :Left base atelectasis. No frank edema or consolidation. Heart borderline enlarged  Has rec'd lasix 40 IV twice and is neg 1400 if correct.  BP 117/79  P 70s R 17  Currently feeling better but sitting up in bed.    Past Medical History:  Diagnosis Date   Arthritis of left knee 12/02/2020   Bilateral leg edema 05/04/2017   CAD (coronary artery disease)    nonST elevated MI in Oct 2008 treated w/2 drug -eluting stents to the RCA and  mid LAD, EF 55%  CAD (coronary artery disease), native coronary artery 11/12/2018   Formatting of this note might be different from the original. S/p MI - Dr. 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 Hypertension  10/1 IMO update   Gout, unspecified 10/14/2007   Overview:  Gout  10/1 IMO update  Formatting of this note might be different from the original. Gout  10/1 IMO update   History of colonic polyps 11/12/2018   Overview:  rpt colonoscopy 5/17; Dr. Haig Prophet of this note  might be different from the original. rpt colonoscopy 5/17; Dr. Erlene Quan   History of kidney stones    History of nephrolithiasis 11/12/2018   History of smoking 11/12/2018   Overview:  quit 2000  Formatting of this note might be different from the original. quit 2000   HTN (hypertension)    Hyperlipidemia    HYPERLIPIDEMIA-MIXED 10/14/2007   Qualifier: Diagnosis of  By: Haroldine Laws, MD, Willette Cluster of this note might be different from the original. Hyperlipidemia   HYPERTENSION, BENIGN 06/15/2009   Inguinal hernia without mention of obstruction or gangrene, recurrent unilateral or unspecified 11/12/2018   Ischemic cardiomyopathy 08/12/2020   Medication intolerance 11/12/2018   Mixed hyperlipidemia 11/12/2018   Old myocardial infarction 11/12/2018   Paroxysmal atrial fibrillation ([place]) 07/16/2020   Pre-operative cardiovascular examination 11/12/2018   Primary osteoarthritis of both knees 03/05/2017   Considering Zilretta injections. Good improvement with corticosteroid injections however short-lived. Mild improvement with Visco supplementation.   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 Diabetes Mellitus   Unilateral primary osteoarthritis, right knee 11/15/2018    Past Surgical History:  Procedure Laterality Date   BILATERAL KNEE ARTHROSCOPY     CORONARY ANGIOPLASTY WITH STENT PLACEMENT     LAPAROSCOPIC INGUINAL HERNIA REPAIR     NOSE SURGERY     TOTAL KNEE ARTHROPLASTY Right 11/15/2018   Procedure: RIGHT TOTAL KNEE ARTHROPLASTY;  Surgeon: Mcarthur Rossetti, MD;  Location: [facility];  Service: Orthopedics;  Laterality: Right;   TOTAL KNEE ARTHROPLASTY  Left 12/03/2020   Procedure: LEFT TOTAL KNEE ARTHROPLASTY;  Surgeon: Mcarthur Rossetti, MD;  Location: [facility];  Service: Orthopedics;  Laterality: Left;     Home Medications:  Prior to Admission medications   Medication Sig Start Date End Date Taking? Authorizing Provider  albuterol (VENTOLIN HFA) 108 (90 Base) MCG/ACT inhaler Inhale 2 puffs into the lungs every 6 (six) hours as needed for shortness of breath. 05/03/21  Yes [provider]  ALPRAZolam Duanne Moron) 0.25 MG tablet Take 0.25 mg by mouth at bedtime as needed for anxiety.  11/27/16  Yes [provider]  aspirin 81 MG tablet Take 81 mg by mouth at bedtime.   Yes [provider]  chlorthalidone (  HYGROTON) 25 MG tablet Take 25 mg by mouth daily in the afternoon. 10/08/20  Yes [provider]  CIALIS 20 MG tablet Take 20 mg by mouth daily as needed for erectile dysfunction.  02/06/17  Yes [provider]  clopidogrel (PLAVIX) 75 MG tablet Take 75 mg by mouth daily. 03/16/21  Yes [provider]  colchicine 0.6 MG tablet Take 0.6 mg by mouth daily as needed. Gout flare up 05/02/21  Yes [provider]  cyanocobalamin (,VITAMIN B-12,) 1000 MCG/ML injection Inject 1,000 mcg into the muscle every 30 (thirty) days.  05/12/16  Yes [provider]  febuxostat (ULORIC) 40 MG tablet Take 40 mg by mouth daily. 04/28/21  Yes [provider]  fluticasone (FLONASE) 50 MCG/ACT nasal spray Place 1 spray into both nostrils daily as needed for allergies or rhinitis.   Yes [provider]  furosemide (LASIX) 20 MG tablet Take 20 mg by mouth daily as needed for fluid or edema.   Yes [provider]  LANTUS SOLOSTAR 100 UNIT/ML Solostar Pen Inject 21 Units into the skin at bedtime. 03/14/20  Yes [provider]  levalbuterol (XOPENEX HFA) 45 MCG/ACT inhaler Inhale 1 puff into the lungs every 4 (four) hours as needed for wheezing.   Yes [provider]   Potassium 99 MG TABS Take 99 mg by mouth at bedtime.   Yes [provider]  rosuvastatin (CRESTOR) 20 MG tablet Take 20 mg by mouth at bedtime.   Yes [provider]  traMADol (ULTRAM) 50 MG tablet Take 25 mg by mouth every 4 (four) hours as needed for pain or severe pain. 11/13/20  Yes [provider]  doxycycline (VIBRA-TABS) 100 MG tablet Take 1 tablet (100 mg total) by mouth 2 (two) times daily. Patient not taking: Reported on 05/04/2021 12/30/20   Pete Pelt, PA-C  HYDROmorphone (DILAUDID) 2 MG tablet Take 1 tablet (2 mg total) by mouth every 4 (four) hours as needed for severe pain. Patient not taking: Reported on 05/04/2021 12/16/20   Mcarthur Rossetti, MD  lidocaine (LIDODERM) 5 % PLACE 1 PATCH ONTO THE SKIN DAILY. REMOVE & DISCARD PATCH WITHIN 12 HOURS OR AS DIRECTED BY MD Patient not taking: Reported on 05/04/2021 02/04/21   Pete Pelt, PA-C  rivaroxaban (XARELTO) 20 MG TABS tablet Take 1 tablet (20 mg total) by mouth daily with supper. Patient not taking: Reported on 05/04/2021 09/22/20   Revankar, Reita Cliche, MD  telmisartan (MICARDIS) 80 MG tablet Take 40 mg by mouth at bedtime. Patient not taking: Reported on 05/04/2021 09/13/20   [provider]    Inpatient Medications: Scheduled Meds:  aspirin EC  81 mg Oral QHS   cyanocobalamin  1,000 mcg Intramuscular Q30 days   enoxaparin (LOVENOX) injection  40 mg Subcutaneous Q24H   furosemide  40 mg Intravenous Q12H   insulin aspart  0-9 Units Subcutaneous TID AC & HS   insulin glargine  25 Units Subcutaneous Q2200   lidocaine  1 patch Transdermal Q24H   potassium chloride  10 mEq Oral QHS   rosuvastatin  20 mg Oral QHS   sodium chloride flush  3 mL Intravenous Q12H   Continuous Infusions:  sodium chloride     PRN Meds: sodium chloride, acetaminophen, albuterol, ALPRAZolam, fluticasone, HYDROmorphone, magnesium hydroxide, ondansetron (ZOFRAN) IV, sodium chloride flush, traMADol,  zolpidem  Allergies:    Allergies  Allergen Reactions   Allopurinol Other (See Comments)    Felt terrible   Aripiprazole  Other (See Comments)    Felt terrible   Bupropion Other (See Comments)    Felt bad   Buspirone Other (See Comments)    Felt bad   Desvenlafaxine Other (See Comments)    Made him hyper   Duloxetine Other (See Comments)    Knocked him out   Escitalopram Other (See Comments)    Felt like crap   Fish Oil     unknown   Fluticasone Furoate Other (See Comments)    Unknown    Hydroxyzine Hcl Other (See Comments)    Knocked him out   Nortriptyline Other (See Comments)    Ineffective   Paroxetine Hcl Other (See Comments)    unknown   Ramipril Cough   Sertraline Other (See Comments)    "felt plugged in"   Venlafaxine Other (See Comments)    Felt bad   Vilazodone Other (See Comments)    "felt weird"    Social History:   Social History   Socioeconomic History   Marital status: Widowed    Spouse name: Not on file   Number of children: Not on file   Years of education: Not on file   Highest education level: Not on file  Occupational History   Not on file  Tobacco Use   Smoking status: Former    Packs/day: 0.50    Years: 10.00    Pack years: 5.00    Types: Cigarettes   Smokeless tobacco: Former    Types: Snuff, Chew    Quit date: 11/20/1968   Tobacco comments:    in his Mar 05, 2023  Vaping Use   Vaping Use: Never used  Substance and Sexual Activity   Alcohol use: No   Drug use: No   Sexual activity: Yes  Other Topics Concern   Not on file  Social History Narrative   Not on file   Social Determinants of Health   Financial Resource Strain: Not on file  Food Insecurity: Not on file  Transportation Needs: Not on file  Physical Activity: Not on file  Stress: Not on file  Social Connections: Not on file  Intimate Partner Violence: Not on file    Family History:    Family History  Problem Relation Age of Onset   Hypertension Mother    Cancer  Mother    Heart attack Father    Hypertension Father    Diabetes Father    Hypertension Brother    Irregular heart beat Brother      ROS:  Please see the history of present illness.  General:no colds or fevers, no weight changes Skin:+ rashes and redness on lower ext no ulcers HEENT:no blurred vision, no congestion CV:see HPI PUL:see HPI GI:no diarrhea constipation or melena, no indigestion GU:no hematuria, no dysuria MS:no joint pain, no claudication Neuro:no syncope, no lightheadedness Endo:+ diabetes, no thyroid disease Psych: with depression since death of his wife in 2018-03-04.   All other ROS reviewed and negative.     Physical Exam/Data:   Vitals:   05/04/21 1100 05/04/21 1130 05/04/21 1230 05/04/21 1317  BP: 117/79 112/87 119/80 121/82  Pulse: 77 75 72 75  Resp: 18 (!) 9 16 16   Temp:    97.6 F (36.4 C)  TempSrc:      SpO2: 97% 94% 96% 99%  Weight:      Height:        Intake/Output Summary (Last 24 hours) at 05/04/2021 1349 Last data filed at 05/04/2021 0654 Gross per 24 hour  Intake --  Output 1400 ml  Net -1400 ml   Last 3 Weights 05/04/2021 01/04/2021 12/03/2020  Weight (lbs) 212 lb 1.3 oz 212 lb 1.3 oz 231 lb 14.8 oz  Weight (kg) 96.2 kg 96.199 kg 105.2 kg     Body mass index is 27.98 kg/m.  General:  Well nourished, well developed, in no acute distress, though lower ext edema HEENT: normal Lymph: no adenopathy Neck: no JVD Endocrine:  No thryomegaly Vascular: No carotid bruits; pedal pulses 1+ bilaterally   Cardiac:  normal S1, S2; RRR; no murmur gallup rub or click Lungs:  rales and diminished breath sounds to auscultation bilaterally, no wheezing, rhonchi   Abd: soft, nontender, no hepatomegaly  Ext: 3-4+ edema of lower ext and 1-2+ up to hips Musculoskeletal:  No deformities, BUE and BLE strength normal and equal Skin: warm and dry  Neuro:  alert and oriented X 3 MAE follows commands, no focal abnormalities noted Psych:  Normal affect     Relevant CV Studies: Echo pending today  Echo 07/28/20  IMPRESSIONS     1. Left ventricular ejection fraction, by estimation, is 35 to 40%. The  left ventricle has moderately decreased function. The left ventricle has  no regional wall motion abnormalities. Left ventricular diastolic function  could not be evaluated.   2. Left atrial size was moderate to severe.   3. The mitral valve is normal in structure. Mild mitral valve  regurgitation. No evidence of mitral stenosis.   4. 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   FINDINGS   Left Ventricle: Left ventricular ejection fraction, by estimation, is 35  to 40%. The left ventricle has moderately decreased function. The left  ventricle has no regional wall motion abnormalities. The left ventricular  internal cavity size was normal in  size. There is no left ventricular hypertrophy. Left ventricular diastolic  function could not be evaluated.   Right Ventricle: The right ventricular size is normal. No increase in  right ventricular wall thickness. Right ventricular systolic function is  normal. There is moderately elevated pulmonary artery systolic pressure.  The tricuspid regurgitant velocity is  3.30 m/s, and with an assumed right atrial pressure of 8 mmHg, the  estimated right ventricular systolic pressure is 29.9 mmHg.   Left Atrium: Left atrial size was moderate to severe.   Right Atrium: Right atrial size was normal in size.   Pericardium: There is no evidence of pericardial effusion.   Mitral Valve: The mitral valve is normal in structure. Mild mitral valve  regurgitation. No evidence of mitral valve stenosis.   Tricuspid Valve: The tricuspid valve is normal in structure. Tricuspid  valve regurgitation is mild . No evidence of tricuspid stenosis.   Aortic Valve: The aortic valve is normal in structure. Aortic valve  regurgitation is not visualized. No aortic stenosis is  present.   Pulmonic Valve: The pulmonic valve was normal in structure. Pulmonic valve  regurgitation is not visualized. No evidence of pulmonic stenosis.   Aorta: The aortic root is normal in size and structure. There is mild to  moderate dilatation of the ascending aorta, measuring 40 mm.   Venous: The inferior vena cava is normal in size with greater than 50%  respiratory variability, suggesting right atrial pressure of 3 mmHg.   IAS/Shunts: No atrial level shunt detected by color flow Doppler.   Nuc study 07/23/20  Study Highlights    The left ventricular ejection fraction is severely  decreased (<30%). Nuclear stress EF: 21%. Findings consistent with prior myocardial infarction. This is a high risk study.   Small inferior basal wall infarct no ischemia Study deemed high risk Based on EF estimated at 21%.  Suggest echo / MRI correlation since Patient is in afib   Laboratory Data:  High Sensitivity Troponin:   Recent Labs  Lab 05/04/21 0432 05/04/21 1155  TROPONINIHS 176* 252*     Chemistry Recent Labs  Lab 05/03/21 2104 05/04/21 1155  NA 140 140  K 3.0* 2.9*  CL 104 102  CO2 26 29  GLUCOSE 148* 149*  BUN 23 21  CREATININE 1.80* 1.53*  CALCIUM 9.2 9.1  GFRNONAA 40* 49*  ANIONGAP 10 9    Recent Labs  Lab 05/04/21 1155  PROT 6.6  ALBUMIN 3.6  AST 23  ALT 13  ALKPHOS 84  BILITOT 2.5*   Hematology Recent Labs  Lab 05/03/21 2104 05/04/21 0432  WBC 5.8 5.1  RBC 5.74 5.41  HGB 15.0 14.3  HCT 48.6 45.3  MCV 84.7 83.7  MCH 26.1 26.4  MCHC 30.9 31.6  RDW 15.0 15.1  PLT 122* 96*   BNP Recent Labs  Lab 05/03/21 2105  BNP 646.8*    DDimer No results for input(s): DDIMER in the last 168 hours.   Radiology/Studies:  DG Chest 2 View  Result Date: 05/03/2021 CLINICAL DATA:  Shortness of breath EXAM: CHEST - 2 VIEW COMPARISON:  August 17, 2016 FINDINGS: There is mild left base atelectasis. The lungs elsewhere are clear. Heart is borderline  enlarged with pulmonary vascularity normal. No adenopathy. No bone lesions. IMPRESSION: Left base atelectasis. No frank edema or consolidation. Heart borderline enlarged. Electronically Signed   By: Lowella Grip III M.D.   On: 05/03/2021 21:09     Assessment and Plan:   Acute on chronic systolic CHF with elevated BNP. Prior EF 2021 35-40% mild MR.  Now with dyspnea occurred over 3-5 days. CXR without acute CHF.  Now diuresing continue IV lasix and agree with check of echo.  Will also check another troponin  monitor Cr . Hypokalemia at 3 on arrival will recheck now, replaced by 40 meq previously will check Mg+ as well.  New RBBB and troponin mildly elevated.  Concern of prior CAD with stent to RCA in 2009 with MI, he had residual CAD including 40 % LM, nuc last year neg for ischemia but concern for balanced ischemia on that test.  Pt states he will not have another because he felt so bad with it.  Will defer to MD for possible cath once HF improved, may depend on echo. PAF, hx and on tele at times no P wave seen for stretches no longer on xarelto - could not tell if pt stopped or Hematology stopped.  I did explain that plavix would not prevent CVA for atrial fib but xarelto would- he is on plavix. CAD see above. HTN on micardis HLD on statin  Erythrocytosis/thrombocytopenia. Last phlebotomy in April this year.  Followed by hematology in [place]/[facility]. DM-2 per IM   Risk Assessment/Risk Scores:        New York Heart Association (NYHA) Functional Class NYHA Class III        For questions or updates, please contact [facility] Please consult [email or web address] for contact info under    Signed, Candee Furbish, MD  05/04/2021 1:49 PM  Personally seen and examined. Agree with above.  70 year old male here with acute on chronic systolic heart failure with EF  35%.  Increased shortness of breath over the past 5 days.  On exam alert and orient x3 no acute distress heart regular rate  and rhythm with no appreciable murmurs, lungs crackles at bases, significant lower extremity edema 4+  EKG personally reviewed shows sinus rhythm with first-degree block, right bundle branch block  Creatinine 1.8, troponin 176 platelet count 96 BNP 646  Assessment and plan:  Acute on chronic systolic heart failure - EF 35%, continue with IV Lasix.  Repeating echocardiogram.  Repeat troponin 252.  Continue to monitor basic metabolic profile during diuresis.  Hypokalemia is being treated.  Abnormal EKG with right bundle branch block - First-degree AV block also noted.  Continue to watch for any signs of worsening conduction  Coronary artery disease - Prior CAD with stent to RCA in 2009.  Had a 40% left main but nuclear stress test did not show any ischemia however with left main disease could be balanced ischemia. -It sounds reasonable to perform heart catheterization tomorrow if able.  Breathing is better.  Risk and benefits of been explained including stroke heart attack death renal impairment bleeding.  He is willing to proceed.  We will proceed tomorrow morning.  N.p.o. past midnight.  Paroxysmal atrial fibrillation - Not taking Xarelto at home.  Candee Furbish, MD

## 2021-05-04 NOTE — ED Notes (Signed)
Critical value of troponin of 176 called; Dr. Sidney Ace, MD, paged.

## 2021-05-05 ENCOUNTER — Encounter (HOSPITAL_COMMUNITY): Payer: Self-pay | Admitting: Family Medicine

## 2021-05-05 ENCOUNTER — Inpatient Hospital Stay (HOSPITAL_COMMUNITY): Payer: Medicare Other

## 2021-05-05 ENCOUNTER — Encounter (HOSPITAL_COMMUNITY)
Admission: EM | Disposition: A | Discharge status: Home / Self Care | Payer: Self-pay | Source: Home / Self Care | Attending: Family Medicine

## 2021-05-05 DIAGNOSIS — I509 Heart failure, unspecified: Secondary | ICD-10-CM | POA: Diagnosis not present

## 2021-05-05 DIAGNOSIS — I25119 Atherosclerotic heart disease of native coronary artery with unspecified angina pectoris: Secondary | ICD-10-CM | POA: Diagnosis not present

## 2021-05-05 DIAGNOSIS — I5023 Acute on chronic systolic (congestive) heart failure: Secondary | ICD-10-CM | POA: Diagnosis not present

## 2021-05-05 HISTORY — PX: LEFT HEART CATH AND CORONARY ANGIOGRAPHY: CATH118249

## 2021-05-05 LAB — COMPREHENSIVE METABOLIC PANEL
ALT: 14 U/L (ref 0–44)
AST: 23 U/L (ref 15–41)
Albumin: 3.6 g/dL (ref 3.5–5.0)
Alkaline Phosphatase: 90 U/L (ref 38–126)
Anion gap: 8 (ref 5–15)
BUN: 26 mg/dL — ABNORMAL HIGH (ref 8–23)
CO2: 32 mmol/L (ref 22–32)
Calcium: 8.9 mg/dL (ref 8.9–10.3)
Chloride: 101 mmol/L (ref 98–111)
Creatinine, Ser: 1.92 mg/dL — ABNORMAL HIGH (ref 0.61–1.24)
GFR, Estimated: 37 mL/min — ABNORMAL LOW (ref >60)
Glucose, Bld: 127 mg/dL — ABNORMAL HIGH (ref 70–99)
Potassium: 3.1 mmol/L — ABNORMAL LOW (ref 3.5–5.1)
Sodium: 141 mmol/L (ref 135–145)
Total Bilirubin: 1.7 mg/dL — ABNORMAL HIGH (ref 0.3–1.2)
Total Protein: 6.4 g/dL — ABNORMAL LOW (ref 6.5–8.1)

## 2021-05-05 LAB — CBC WITH DIFFERENTIAL/PLATELET
Abs Immature Granulocytes: 0.01 10*3/uL (ref 0.00–0.07)
Basophils Absolute: 0.1 10*3/uL (ref 0.0–0.1)
Basophils Relative: 1 %
Eosinophils Absolute: 0.2 10*3/uL (ref 0.0–0.5)
Eosinophils Relative: 4 %
HCT: 43.9 % (ref 39.0–52.0)
Hemoglobin: 13.7 g/dL (ref 13.0–17.0)
Immature Granulocytes: 0 %
Lymphocytes Relative: 18 %
Lymphs Abs: 0.9 10*3/uL (ref 0.7–4.0)
MCH: 26.7 pg (ref 26.0–34.0)
MCHC: 31.2 g/dL (ref 30.0–36.0)
MCV: 85.4 fL (ref 80.0–100.0)
Monocytes Absolute: 0.5 10*3/uL (ref 0.1–1.0)
Monocytes Relative: 9 %
Neutro Abs: 3.4 10*3/uL (ref 1.7–7.7)
Neutrophils Relative %: 68 %
Platelets: 104 10*3/uL — ABNORMAL LOW (ref 150–400)
RBC: 5.14 MIL/uL (ref 4.22–5.81)
RDW: 15.2 % (ref 11.5–15.5)
WBC: 5 10*3/uL (ref 4.0–10.5)
nRBC: 0 % (ref 0.0–0.2)

## 2021-05-05 LAB — PROTIME-INR
INR: 1.3 — ABNORMAL HIGH (ref 0.8–1.2)
Prothrombin Time: 16.2 seconds — ABNORMAL HIGH (ref 11.4–15.2)

## 2021-05-05 LAB — SURGICAL PCR SCREEN
MRSA, PCR: NEGATIVE
Staphylococcus aureus: NEGATIVE

## 2021-05-05 LAB — HEMOGLOBIN A1C
Hgb A1c MFr Bld: 6.6 % — ABNORMAL HIGH (ref 4.8–5.6)
Mean Plasma Glucose: 143 mg/dL

## 2021-05-05 LAB — GLUCOSE, CAPILLARY
Glucose-Capillary: 119 mg/dL — ABNORMAL HIGH (ref 70–99)
Glucose-Capillary: 102 mg/dL — ABNORMAL HIGH (ref 70–99)
Glucose-Capillary: 90 mg/dL (ref 70–99)
Glucose-Capillary: 139 mg/dL — ABNORMAL HIGH (ref 70–99)
Glucose-Capillary: 161 mg/dL — ABNORMAL HIGH (ref 70–99)

## 2021-05-05 LAB — PHOSPHORUS: Phosphorus: 3.4 mg/dL (ref 2.5–4.6)

## 2021-05-05 LAB — MAGNESIUM: Magnesium: 1.9 mg/dL (ref 1.7–2.4)

## 2021-05-05 LAB — TROPONIN I (HIGH SENSITIVITY): Troponin I (High Sensitivity): 221 ng/L (ref <18)

## 2021-05-05 SURGERY — LEFT HEART CATH AND CORONARY ANGIOGRAPHY
Anesthesia: LOCAL

## 2021-05-05 MED ORDER — ACETAMINOPHEN 325 MG PO TABS: 650.0000 mg | ORAL_TABLET | ORAL | Status: DC | PRN

## 2021-05-05 MED ORDER — LIDOCAINE HCL (PF) 1 % IJ SOLN
INTRAMUSCULAR | Status: AC
  Filled 2021-05-05: qty 30

## 2021-05-05 MED ORDER — ASPIRIN EC 81 MG PO TBEC
81.0000 mg | DELAYED_RELEASE_TABLET | Freq: Every day | ORAL | Status: DC
  Administered 2021-05-06: 81 mg via ORAL
  Filled 2021-05-05: qty 1

## 2021-05-05 MED ORDER — FENTANYL CITRATE (PF) 100 MCG/2ML IJ SOLN
INTRAMUSCULAR | Status: AC
  Filled 2021-05-05: qty 2

## 2021-05-05 MED ORDER — VERAPAMIL HCL 2.5 MG/ML IV SOLN
INTRAVENOUS | Status: AC
  Filled 2021-05-05: qty 2

## 2021-05-05 MED ORDER — IOHEXOL 350 MG/ML SOLN
INTRAVENOUS | Status: DC | PRN
  Administered 2021-05-05: 35 mL

## 2021-05-05 MED ORDER — ASPIRIN 81 MG PO CHEW: 81.0000 mg | CHEWABLE_TABLET | Freq: Every day | ORAL | Status: DC

## 2021-05-05 MED ORDER — LABETALOL HCL 5 MG/ML IV SOLN: 10.0000 mg | INTRAVENOUS | Status: AC | PRN

## 2021-05-05 MED ORDER — HEPARIN (PORCINE) IN NACL 1000-0.9 UT/500ML-% IV SOLN
INTRAVENOUS | Status: DC | PRN
  Administered 2021-05-05 (×2): 500 mL

## 2021-05-05 MED ORDER — HEPARIN (PORCINE) IN NACL 1000-0.9 UT/500ML-% IV SOLN
INTRAVENOUS | Status: AC
  Filled 2021-05-05: qty 500

## 2021-05-05 MED ORDER — SODIUM CHLORIDE 0.9 % IV SOLN: INTRAVENOUS | Status: AC

## 2021-05-05 MED ORDER — FENTANYL CITRATE (PF) 100 MCG/2ML IJ SOLN
INTRAMUSCULAR | Status: DC | PRN
  Administered 2021-05-05: 25 ug via INTRAVENOUS

## 2021-05-05 MED ORDER — SODIUM CHLORIDE 0.9 % IV SOLN: 250.0000 mL | INTRAVENOUS | Status: DC | PRN

## 2021-05-05 MED ORDER — ONDANSETRON HCL 4 MG/2ML IJ SOLN: 4.0000 mg | Freq: Four times a day (QID) | INTRAMUSCULAR | Status: DC | PRN

## 2021-05-05 MED ORDER — MIDAZOLAM HCL 2 MG/2ML IJ SOLN
INTRAMUSCULAR | Status: AC
  Filled 2021-05-05: qty 2

## 2021-05-05 MED ORDER — DIAZEPAM 5 MG PO TABS: 5.0000 mg | ORAL_TABLET | ORAL | Status: DC | PRN

## 2021-05-05 MED ORDER — POTASSIUM CHLORIDE CRYS ER 20 MEQ PO TBCR
20.0000 meq | EXTENDED_RELEASE_TABLET | Freq: Once | ORAL | Status: AC
  Administered 2021-05-05: 20 meq via ORAL
  Filled 2021-05-05: qty 1

## 2021-05-05 MED ORDER — HEPARIN SODIUM (PORCINE) 1000 UNIT/ML IJ SOLN
INTRAMUSCULAR | Status: DC | PRN
  Administered 2021-05-05: 5000 [IU] via INTRAVENOUS

## 2021-05-05 MED ORDER — VERAPAMIL HCL 2.5 MG/ML IV SOLN
INTRAVENOUS | Status: DC | PRN
  Administered 2021-05-05: 10 mL via INTRA_ARTERIAL

## 2021-05-05 MED ORDER — POTASSIUM CHLORIDE CRYS ER 20 MEQ PO TBCR
40.0000 meq | EXTENDED_RELEASE_TABLET | Freq: Once | ORAL | Status: AC
  Administered 2021-05-05: 40 meq via ORAL
  Filled 2021-05-05: qty 2

## 2021-05-05 MED ORDER — LIDOCAINE HCL (PF) 1 % IJ SOLN
INTRAMUSCULAR | Status: DC | PRN
  Administered 2021-05-05: 2 mL

## 2021-05-05 MED ORDER — ROSUVASTATIN CALCIUM 20 MG PO TABS
40.0000 mg | ORAL_TABLET | Freq: Every day | ORAL | Status: DC
  Administered 2021-05-05: 40 mg via ORAL
  Filled 2021-05-05: qty 2

## 2021-05-05 MED ORDER — HYDRALAZINE HCL 20 MG/ML IJ SOLN: 10.0000 mg | INTRAMUSCULAR | Status: AC | PRN

## 2021-05-05 MED ORDER — SODIUM CHLORIDE 0.9% FLUSH
3.0000 mL | Freq: Two times a day (BID) | INTRAVENOUS | Status: DC
  Administered 2021-05-05 – 2021-05-06 (×2): 3 mL via INTRAVENOUS

## 2021-05-05 MED ORDER — HEPARIN SODIUM (PORCINE) 1000 UNIT/ML IJ SOLN
INTRAMUSCULAR | Status: AC
  Filled 2021-05-05: qty 1

## 2021-05-05 MED ORDER — SODIUM CHLORIDE 0.9% FLUSH: 3.0000 mL | INTRAVENOUS | Status: DC | PRN

## 2021-05-05 MED ORDER — SODIUM CHLORIDE 0.9 % IV SOLN
INTRAVENOUS | Status: AC | PRN
  Administered 2021-05-05: 10 mL/h via INTRAVENOUS

## 2021-05-05 MED ORDER — MIDAZOLAM HCL 2 MG/2ML IJ SOLN
INTRAMUSCULAR | Status: DC | PRN
  Administered 2021-05-05: 2 mg via INTRAVENOUS

## 2021-05-05 SURGICAL SUPPLY — 9 items
CATH OPTITORQUE TIG 4.0 5F (CATHETERS) ×2 IMPLANT
DEVICE RAD COMP TR BAND LRG (VASCULAR PRODUCTS) ×2 IMPLANT
GLIDESHEATH SLEND SS 6F .021 (SHEATH) ×2 IMPLANT
GUIDEWIRE INQWIRE 1.5J.035X260 (WIRE) ×1 IMPLANT
INQWIRE 1.5J .035X260CM (WIRE) ×2
KIT HEART LEFT (KITS) ×2 IMPLANT
PACK CARDIAC CATHETERIZATION (CUSTOM PROCEDURE TRAY) ×2 IMPLANT
TRANSDUCER W/STOPCOCK (MISCELLANEOUS) ×2 IMPLANT
TUBING CIL FLEX 10 FLL-RA (TUBING) ×2 IMPLANT

## 2021-05-05 NOTE — Progress Notes (Signed)
Spoke to [facility] from [place] to arrange pt transport for cardiac cath at [facility].  Marcello Moores states he will touch base with cardiac cath staff at [facility] to ensure proper pick time for patient from [place] [facility].  Medical necessity form printed.  Pt's consent for procedure in pt chart.

## 2021-05-05 NOTE — Progress Notes (Signed)
TR BAND REMOVAL  LOCATION:    right radial  DEFLATED PER PROTOCOL:    Yes.    TIME BAND OFF / DRESSING APPLIED:    1800   SITE UPON ARRIVAL:    Level 0  SITE AFTER BAND REMOVAL:    Level 0  CIRCULATION SENSATION AND MOVEMENT:    Within Normal Limits   Yes.    COMMENTS:   Do not  use the R hand for 24 hours.

## 2021-05-05 NOTE — Plan of Care (Signed)

## 2021-05-05 NOTE — Progress Notes (Signed)
PROGRESS NOTE    [name] [name]  ERD:[id number] DOB: September 19, 1951 DOA: 05/03/2021 PCP: Raeanne Gathers, MD  Chief Complaint  Patient presents with   Shortness of Breath   Brief Narrative:  [name] [name] is Press Casale 70 y.o. Caucasian male with medical history significant for multiple medical problems that are mentioned below, who presented to the emergency room with acute onset of generalized weakness over the last couple of days with associated dyspnea as well as orthopnea and paroxysmal nocturnal dyspnea with significantly worsening lower extremity edema for the last few days.  He admitted to dyspnea on exertion.  He denies any fever or chills.  He has been having dry cough and occasional wheezing.  No chest pain or palpitations.  No nausea or vomiting or abdominal pain.  No dysuria, oliguria or hematuria or flank pain.   ED Course: When he came to the ER blood pressure was 136/91 with otherwise normal vital signs.  Labs revealed hypokalemia and Maudie Shingledecker creatinine of 1.8 with Khaniyah Bezek BUN of 23 compared with Marcelle Hepner creatinine of 1.49compared with Concetta Guion BUN of 27 on 12/04/2020.  CBC was unremarkable.  BNP was 646.8. EKG as reviewed by me : Showed sinus rhythm with rate of 85 with prolonged PR interval, right bundle branch block and left posterior fascicular block. Imaging:  Two-view chest x-ray showed left basilar atelectasis with borderline cardiomegaly and no frank edema or consolidation.  The patient was given 40 mg of IV Lasix and 40 mill equivalent p.o. potassium chloride.  He will be admitted to [facility] telemetry bed for further evaluation and management.  Assessment & Plan:   Active Problems:   Acute on chronic systolic CHF (congestive heart failure) (HCC)  Acute on chronic systolic HF exacerbation Echo 07/2020 with EF 35-40% Appears volume overloaded with edema CXR with L base atelectasis Lasix 40 mg BID - on hold with cath today Echo pending  Follow LE [place] Cardiology c/s, appreciate recs -> plan for cath  today -> moderate coronary calcification involving predominantly the LAD and RCA (see report) - recommending post cath hydration, antianginal medical therapy, aggressive lipid lowering therapy He was taking lasix only as needed, no longer taking micardis  Elevated Troponin  New RBBB Echo pending Suspect demand in setting of HF exacerbation Cardiology c/s, appreciate recommendations  CKD IIIb Creatinine rising with diuresis - follow post cath Baseline appears to be ~1.5-1.7  History Atrial Fibrillation  He stopped xarelto on his own accord  No AV nodal blocker Cardiology following  Hypokalemia. Replace and follow   Essential hypertension. He's no longer taking micardis Hold chlorthalidone with need for lasix  Vitamin B12 deficiency. We will continue vitamin B12.  Type 2 diabetes mellitus. The patient will be placed on supplement coverage with NovoLog and will continue Glucotrol XL.  Dyslipidemia. We will continue his statin therapy.  Depression. We will continue Zoloft.  DVT prophylaxis: lovenox Code Status: full  Family Communication: none at bedside Disposition:   Status is: Inpatient  Remains inpatient appropriate because:Inpatient level of care appropriate due to severity of illness  Dispo: The patient is from: Home              Anticipated d/c is to: Home              Patient currently is not medically stable to d/c.   Difficult to place patient No       Consultants:  cardiology  Procedures:  Echo pending LE [place] Summary:  RIGHT:  - There is  no evidence of deep vein thrombosis in the lower extremity.     - No cystic structure found in the popliteal fossa.  - Pulsatile waveforms suggestive of elevated right heart pressures.     LEFT:  - There is no evidence of deep vein thrombosis in the lower extremity.     - No cystic structure found in the popliteal fossa.  - Pulsatile waveforms suggestive of elevated right heart  pressures.  Antimicrobials:  Anti-infectives (From admission, onward)    None          Subjective: No new complaints  Objective: Vitals:   05/05/21 1643 05/05/21 1646 05/05/21 1648 05/05/21 1653  BP: 108/69 (!) 122/93 117/80 (!) 122/93  Pulse: 73 80 74 74  Resp: 18 (!) 22 (!) 24 (!) 24  Temp:      TempSrc:      SpO2: 94% 99% 96% 94%  Weight:      Height:        Intake/Output Summary (Last 24 hours) at 05/05/2021 1821 Last data filed at 05/05/2021 1746 Gross per 24 hour  Intake 180.92 ml  Output 400 ml  Net -219.08 ml   Filed Weights   05/04/21 0325 05/05/21 0500  Weight: 96.2 kg 104.3 kg    Examination:  General: No acute distress. Cardiovascular: RRR Lungs: unlabored Abdomen: Soft, nontender, nondistended Neurological: Alert and oriented 3. Moves all extremities 4 . Cranial nerves II through XII grossly intact. Skin: Warm and dry. No rashes or lesions. Extremities: 1+ LEE      Data Reviewed: I have personally reviewed following labs and imaging studies  CBC: Recent Labs  Lab 05/03/21 2104 05/04/21 0432 05/05/21 0500  WBC 5.8 5.1 5.0  NEUTROABS 4.1 3.5 3.4  HGB 15.0 14.3 13.7  HCT 48.6 45.3 43.9  MCV 84.7 83.7 85.4  PLT 122* 96* 104*    Basic Metabolic Panel: Recent Labs  Lab 05/03/21 2104 05/04/21 1155 05/04/21 1419 05/04/21 2131 05/05/21 0500  NA 140 140 141  --  141  K 3.0* 2.9* 3.3*  --  3.1*  CL 104 102 99  --  101  CO2 26 29 33*  --  32  GLUCOSE 148* 149* 105*  --  127*  BUN 23 21 22   --  26*  CREATININE 1.80* 1.53* 1.85*  --  1.92*  CALCIUM 9.2 9.1 9.1  --  8.9  MG  --  1.8  --   --  1.9  PHOS  --   --   --  3.3  --     GFR: Estimated Creatinine Clearance: 46.7 mL/min (Durk Carmen) (by C-G formula based on SCr of 1.92 mg/dL (H)).  Liver Function Tests: Recent Labs  Lab 05/04/21 1155 05/05/21 0500  AST 23 23  ALT 13 14  ALKPHOS 84 90  BILITOT 2.5* 1.7*  PROT 6.6 6.4*  ALBUMIN 3.6 3.6    CBG: Recent Labs  Lab  05/04/21 1710 05/04/21 2058 05/05/21 0723 05/05/21 0952 05/05/21 1125  GLUCAP 120* 164* 119* 102* 90     Recent Results (from the past 240 hour(s))  Resp Panel by RT-PCR (Flu Tyland Klemens&B, Covid) Nasopharyngeal Swab     Status: None   Collection Time: 05/04/21  1:36 AM   Specimen: Nasopharyngeal Swab; Nasopharyngeal(NP) swabs in vial transport medium  Result Value Ref Range Status   SARS Coronavirus 2 by RT PCR NEGATIVE NEGATIVE Final    Comment: (NOTE) SARS-CoV-2 target nucleic acids are NOT DETECTED.  The SARS-CoV-2 RNA is  generally detectable in upper respiratory specimens during the acute phase of infection. The lowest concentration of SARS-CoV-2 viral copies this assay can detect is 138 copies/mL. Halsey Hammen negative result does not preclude SARS-Cov-2 infection and should not be used as the sole basis for treatment or other patient management decisions. Devinn Hurwitz negative result may occur with  improper specimen collection/handling, submission of specimen other than nasopharyngeal swab, presence of viral mutation(s) within the areas targeted by this assay, and inadequate number of viral copies(<138 copies/mL). Ashlon Lottman negative result must be combined with clinical observations, patient history, and epidemiological information. The expected result is Negative.  Fact Sheet for Patients:  EntrepreneurPulse.com.au  Fact Sheet for Healthcare Providers:  IncredibleEmployment.be  This test is no t yet approved or cleared by the [place] FDA and  has been authorized for detection and/or diagnosis of SARS-CoV-2 by FDA under an Emergency Use Authorization (EUA). This EUA will remain  in effect (meaning this test can be used) for the duration of the COVID-19 declaration under Section 564(b)(1) of the Act, 21 U.S.C.section 360bbb-3(b)(1), unless the authorization is terminated  or revoked sooner.       Influenza Yaiden Yang by PCR NEGATIVE NEGATIVE Final   Influenza B by PCR  NEGATIVE NEGATIVE Final    Comment: (NOTE) The Xpert Xpress SARS-CoV-2/FLU/RSV plus assay is intended as an aid in the diagnosis of influenza from Nasopharyngeal swab specimens and should not be used as Meara Wiechman sole basis for treatment. Nasal washings and aspirates are unacceptable for Xpert Xpress SARS-CoV-2/FLU/RSV testing.  Fact Sheet for Patients: EntrepreneurPulse.com.au  Fact Sheet for Healthcare Providers: IncredibleEmployment.be  This test is not yet approved or cleared by the [place] FDA and has been authorized for detection and/or diagnosis of SARS-CoV-2 by FDA under an Emergency Use Authorization (EUA). This EUA will remain in effect (meaning this test can be used) for the duration of the COVID-19 declaration under Section 564(b)(1) of the Act, 21 U.S.C. section 360bbb-3(b)(1), unless the authorization is terminated or revoked.  Performed at [facility], [place] [address], [place], [place] [address]   Surgical pcr screen     Status: None   Collection Time: 05/05/21  5:51 AM   Specimen: Nasal Mucosa; Nasal Swab  Result Value Ref Range Status   MRSA, PCR NEGATIVE NEGATIVE Final   Staphylococcus aureus NEGATIVE NEGATIVE Final    Comment: (NOTE) The Xpert SA Assay (FDA approved for NASAL specimens in patients 45 years of age and older), is one component of Lonette Stevison comprehensive surveillance program. It is not intended to diagnose infection nor to guide or monitor treatment. Performed at [facility], [place] [address], [place],  [address]          Radiology Studies: DG Chest 2 View  Result Date: 05/03/2021 CLINICAL DATA:  Shortness of breath EXAM: CHEST - 2 VIEW COMPARISON:  August 17, 2016 FINDINGS: There is mild left base atelectasis. The lungs elsewhere are clear. Heart is borderline enlarged with pulmonary vascularity normal. No adenopathy. No bone lesions. IMPRESSION: Left base  atelectasis. No frank edema or consolidation. Heart borderline enlarged. Electronically Signed   By: Lowella Grip III M.D.   On: 05/03/2021 21:09   CARDIAC CATHETERIZATION  Result Date: 05/05/2021  Prox RCA to Mid RCA lesion is 5% stenosed.  Prox RCA lesion is 25% stenosed.  Dist RCA lesion is 70% stenosed.  Mid LM to Dist LM lesion is 25% stenosed.  Ost Cx lesion is 20% stenosed.  Prox Cx to Mid  Cx lesion is 30% stenosed.  Prox LAD to Mid LAD lesion is 20% stenosed.  1st Diag lesion is 30% stenosed.  Mid LAD lesion is 50% stenosed.  There is evidence for moderate coronary calcification involving predominantly the LAD and RCA.  The left main has smooth 25% mid stenosis; there is calcification in the proximal LAD with narrowing of 25% followed by 40-50% mid stenosis and 30% diagonal stenosis; the circumflex ostium has 20% stenosis with 30% mid stenosis; the tandem RCA stents are widely patent with minimal intimal hyperplasia. There is 30% stenosis proximal to the stent.  There is 65 to 70% stenosis in the distal RCA beyond the acute margin before the PDA takeoff. LVEDP 19 mm Hg.  RECOMMENDATION: Hydration post cath with creatinine 1.92.  Recommend initiation of antianginal  medical therapy.  Aggressive  lipid-lowering therapy; recommend titration of rosuvastatin to 40 mg.   VAS [place] LOWER EXTREMITY VENOUS (DVT)  Result Date: 05/04/2021  Lower Venous DVT Study Patient Name:  SWAYZE PRIES  Date of Exam:   05/04/2021 Medical Rec #: [id number]           Accession #:    [id number] Date of Birth: [birth year]           Patient Gender: M Patient Age:   70Y Exam Location:  [facility] Procedure:      VAS [place] LOWER EXTREMITY VENOUS (DVT) Referring Phys: [id number] Tyana Butzer CALDWELL POWELL JR --------------------------------------------------------------------------------  Indications: Edema.  Comparison Study: No prior studies. Performing Technologist: Darlin Coco RDMS,RVT  Examination Guidelines: Leathia Farnell  complete evaluation includes B-mode imaging, spectral Doppler, color Doppler, and power Doppler as needed of all accessible portions of each vessel. Bilateral testing is considered an integral part of Deedee Lybarger complete examination. Limited examinations for reoccurring indications may be performed as noted. The reflux portion of the exam is performed with the patient in reverse Trendelenburg.  +---------+---------------+---------+-----------+----------+--------------+ RIGHT    CompressibilityPhasicitySpontaneityPropertiesThrombus Aging +---------+---------------+---------+-----------+----------+--------------+ CFV      Full           No       Yes                                 +---------+---------------+---------+-----------+----------+--------------+ SFJ      Full                                                        +---------+---------------+---------+-----------+----------+--------------+ FV Prox  Full                                                        +---------+---------------+---------+-----------+----------+--------------+ FV Mid   Full                                                        +---------+---------------+---------+-----------+----------+--------------+ FV DistalFull                                                        +---------+---------------+---------+-----------+----------+--------------+  PFV      Full                                                        +---------+---------------+---------+-----------+----------+--------------+ POP      Full           No       Yes                                 +---------+---------------+---------+-----------+----------+--------------+ PTV      Full                                                        +---------+---------------+---------+-----------+----------+--------------+ PERO     Full                                                         +---------+---------------+---------+-----------+----------+--------------+   +---------+---------------+---------+-----------+----------+--------------+ LEFT     CompressibilityPhasicitySpontaneityPropertiesThrombus Aging +---------+---------------+---------+-----------+----------+--------------+ CFV      Full           No       Yes                                 +---------+---------------+---------+-----------+----------+--------------+ SFJ      Full                                                        +---------+---------------+---------+-----------+----------+--------------+ FV Prox  Full                                                        +---------+---------------+---------+-----------+----------+--------------+ FV Mid   Full                                                        +---------+---------------+---------+-----------+----------+--------------+ FV DistalFull                                                        +---------+---------------+---------+-----------+----------+--------------+ PFV      Full                                                        +---------+---------------+---------+-----------+----------+--------------+  POP      Full           No       Yes                                 +---------+---------------+---------+-----------+----------+--------------+ PTV      Full                                                        +---------+---------------+---------+-----------+----------+--------------+ PERO     Full                                                        +---------+---------------+---------+-----------+----------+--------------+     Summary: RIGHT: - There is no evidence of deep vein thrombosis in the lower extremity.  - No cystic structure found in the popliteal fossa. - Pulsatile waveforms suggestive of elevated right heart pressures.  LEFT: - There is no evidence of deep vein thrombosis in the  lower extremity.  - No cystic structure found in the popliteal fossa. - Pulsatile waveforms suggestive of elevated right heart pressures.  *See table(s) above for measurements and observations. Electronically signed by Harold Barban MD on 05/04/2021 at 7:49:04 PM.    Final         Scheduled Meds:  [MAR Hold] aspirin EC  81 mg Oral Daily   [MAR Hold] cyanocobalamin  1,000 mcg Intramuscular Q30 days   [MAR Hold] enoxaparin (LOVENOX) injection  40 mg Subcutaneous Q24H   [MAR Hold] insulin aspart  0-9 Units Subcutaneous TID AC & HS   [MAR Hold] insulin glargine  25 Units Subcutaneous Q2200   [MAR Hold] lidocaine  1 patch Transdermal Q24H   [MAR Hold] rosuvastatin  20 mg Oral QHS   [MAR Hold] sodium chloride flush  3 mL Intravenous Q12H   [MAR Hold] sodium chloride flush  3 mL Intravenous Q12H   Continuous Infusions:  [MAR Hold] sodium chloride     sodium chloride     sodium chloride 10 mL/hr at 05/05/21 0707   sodium chloride 100 mL/hr at 05/05/21 1649     LOS: 1 day    Time spent: over 30 min    Fayrene Helper, MD [facility]   To contact the attending provider between 7A-7P or the covering provider during after hours 7P-7A, please log into the web site [email or web address] and access using universal [place] password for that web site. If you do not have the password, please call the hospital operator.  05/05/2021, 6:21 PM

## 2021-05-05 NOTE — Progress Notes (Addendum)
Report given to April RN from [place].  @1304  All pt belongings placed in pts duffel bag. ( Glasses, phone charger,clothes, pillow).  Procedural Consent given to April to transfer to [facility].

## 2021-05-05 NOTE — TOC Benefit Eligibility Note (Signed)
Transition of Care [facility]) Benefit Eligibility Note    Patient Details  Name: [name] [name] MRN: [id number] Date of Birth: 08-31-1951   Medication/Dose: Xarelto 46m po qd and Eliquis 568mpo qd x 30 day's  Covered?: Yes  Tier: 3 Drug  Prescription Coverage Preferred Pharmacy: local  Spoke with Person/Company/Phone Number:: Naomi/ [employer] [phone]-Pay: Xarelto is $137.66 and Eliquis is $70.88 generic not on formulary  Prior Approval: No  Deductible: Met  Additional Notes: [place] Number: 05/05/2021, 9:58 AM

## 2021-05-05 NOTE — Progress Notes (Signed)
Spoke to [place] [facility] who states they are awaiting cath lab to call [facility] for confirmation of pt pick up.

## 2021-05-05 NOTE — H&P (View-Only) (Signed)
Progress Note  Patient Name: [name] [name] Date of Encounter: 05/05/2021  Primary Cardiologist: Jenean Lindau, MD  Subjective   Feeling somewhat better. Edema improved per patient but very significant on exam.  I clarified the Plavix/Eliquis question with the patient - was on Plavix since 2009 for stroke then prescribed Eliquis by our office when atrial fib was identified. The patient did not feel like he had an adequate explanation/understanding of why this was prescribed. He was also very concerned with the expense when the Plavix was something he did well on for years. He also did not tolerate Entresto due to hypotension. He was concerned about what the motivation was behind prescribing these medications and possible kickbacks from the pharmaceutical companies. We had discussion about the severity of congestive heart failure and the rationale for treatment and frequent medication changes to help improve both symptoms and longevity. We also discussed the rationale for DOAC over Plavix. He appreciates being involved in the decision making and explanations of his healthcare.  He also reports he has been living by himself and struggling with some depression. Has worked with primary care on this but found it difficult to tolerate any of the antidepressants so far.  Inpatient Medications    Scheduled Meds:  aspirin EC  81 mg Oral QHS   cyanocobalamin  1,000 mcg Intramuscular Q30 days   enoxaparin (LOVENOX) injection  40 mg Subcutaneous Q24H   insulin aspart  0-9 Units Subcutaneous TID AC & HS   insulin glargine  25 Units Subcutaneous Q2200   lidocaine  1 patch Transdermal Q24H   potassium chloride  40 mEq Oral Once   Followed by   potassium chloride  20 mEq Oral Once   rosuvastatin  20 mg Oral QHS   sodium chloride flush  3 mL Intravenous Q12H   sodium chloride flush  3 mL Intravenous Q12H   Continuous Infusions:  sodium chloride     sodium chloride     sodium chloride 10  mL/hr at 05/05/21 0707   PRN Meds: sodium chloride, sodium chloride, acetaminophen, albuterol, ALPRAZolam, fluticasone, HYDROmorphone, magnesium hydroxide, ondansetron (ZOFRAN) IV, sodium chloride flush, sodium chloride flush, traMADol, zolpidem   Vital Signs    Vitals:   05/04/21 2101 05/05/21 0122 05/05/21 0500 05/05/21 0555  BP: 109/77 122/85  123/89  Pulse: 75 84  72  Resp: 18 18  18   Temp: 98 F (36.7 C) 97.6 F (36.4 C)  97.7 F (36.5 C)  TempSrc: Oral Oral  Oral  SpO2: 97% 97%  96%  Weight:   104.3 kg   Height:        Intake/Output Summary (Last 24 hours) at 05/05/2021 0823 Last data filed at 05/05/2021 0600 Gross per 24 hour  Intake --  Output 300 ml  Net -300 ml   Last 3 Weights 05/05/2021 05/04/2021 01/04/2021  Weight (lbs) 229 lb 15 oz 212 lb 1.3 oz 212 lb 1.3 oz  Weight (kg) 104.3 kg 96.2 kg 96.199 kg     Telemetry    Predominantly NSR with RBBB/1st degree AVB; rare type 1 second degree AVB, brief period this AM of variable conduction (?AV dissociation without bradycardia) - Personally Reviewed  Physical Exam   GEN: No acute distress.  HEENT: Normocephalic, atraumatic, sclera non-icteric. Neck: No JVD or bruits. Cardiac: RRR no murmurs, rubs, or gallops.  Respiratory: Crackles L base otherwise coarse BS to auscultation bilaterally. No wheezing, rhonchi. Breathing is unlabored. GI: Soft, nontender, non-distended, BS +x 4. MS:  no deformity. Extremities: No clubbing or cyanosis. 3+ BLE edema. Distal pedal pulses are 2+ and equal bilaterally. Neuro:  AAOx3. Follows commands. Psych:  Responds to questions appropriately with a normal affect.  Labs    High Sensitivity Troponin:   Recent Labs  Lab 05/04/21 0432 05/04/21 1155 05/04/21 1419 05/05/21 0500  TROPONINIHS 176* 252* 236* 221*      Cardiac EnzymesNo results for input(s): TROPONINI in the last 168 hours. No results for input(s): TROPIPOC in the last 168 hours.   Chemistry Recent Labs  Lab  05/04/21 1155 05/04/21 1419 05/05/21 0500  NA 140 141 141  K 2.9* 3.3* 3.1*  CL 102 99 101  CO2 29 33* 32  GLUCOSE 149* 105* 127*  BUN 21 22 26*  CREATININE 1.53* 1.85* 1.92*  CALCIUM 9.1 9.1 8.9  PROT 6.6  --  6.4*  ALBUMIN 3.6  --  3.6  AST 23  --  23  ALT 13  --  14  ALKPHOS 84  --  90  BILITOT 2.5*  --  1.7*  GFRNONAA 49* 39* 37*  ANIONGAP 9 9 8      Hematology Recent Labs  Lab 05/03/21 2104 05/04/21 0432 05/05/21 0500  WBC 5.8 5.1 5.0  RBC 5.74 5.41 5.14  HGB 15.0 14.3 13.7  HCT 48.6 45.3 43.9  MCV 84.7 83.7 85.4  MCH 26.1 26.4 26.7  MCHC 30.9 31.6 31.2  RDW 15.0 15.1 15.2  PLT 122* 96* 104*    BNP Recent Labs  Lab 05/03/21 2105  BNP 646.8*     DDimer No results for input(s): DDIMER in the last 168 hours.   Radiology    DG Chest 2 View  Result Date: 05/03/2021 CLINICAL DATA:  Shortness of breath EXAM: CHEST - 2 VIEW COMPARISON:  August 17, 2016 FINDINGS: There is mild left base atelectasis. The lungs elsewhere are clear. Heart is borderline enlarged with pulmonary vascularity normal. No adenopathy. No bone lesions. IMPRESSION: Left base atelectasis. No frank edema or consolidation. Heart borderline enlarged. Electronically Signed   By: Lowella Grip III M.D.   On: 05/03/2021 21:09   VAS [place] LOWER EXTREMITY VENOUS (DVT)  Result Date: 05/04/2021  Lower Venous DVT Study Patient Name:  KARTHIK WHITTINGHILL  Date of Exam:   05/04/2021 Medical Rec #: [id number]           Accession #:    [id number] Date of Birth: [birth year]           Patient Gender: M Patient Age:   70Y Exam Location:  [facility] Procedure:      VAS [place] LOWER EXTREMITY VENOUS (DVT) Referring Phys: [id number] CALDWELL POWELL JR --------------------------------------------------------------------------------  Indications: Edema.  Comparison Study: No prior studies. Performing Technologist: Darlin Coco RDMS,RVT  Examination Guidelines: A complete evaluation includes B-mode imaging, spectral  Doppler, color Doppler, and power Doppler as needed of all accessible portions of each vessel. Bilateral testing is considered an integral part of a complete examination. Limited examinations for reoccurring indications may be performed as noted. The reflux portion of the exam is performed with the patient in reverse Trendelenburg.  +---------+---------------+---------+-----------+----------+--------------+ RIGHT    CompressibilityPhasicitySpontaneityPropertiesThrombus Aging +---------+---------------+---------+-----------+----------+--------------+ CFV      Full           No       Yes                                 +---------+---------------+---------+-----------+----------+--------------+  SFJ      Full                                                        +---------+---------------+---------+-----------+----------+--------------+ FV Prox  Full                                                        +---------+---------------+---------+-----------+----------+--------------+ FV Mid   Full                                                        +---------+---------------+---------+-----------+----------+--------------+ FV DistalFull                                                        +---------+---------------+---------+-----------+----------+--------------+ PFV      Full                                                        +---------+---------------+---------+-----------+----------+--------------+ POP      Full           No       Yes                                 +---------+---------------+---------+-----------+----------+--------------+ PTV      Full                                                        +---------+---------------+---------+-----------+----------+--------------+ PERO     Full                                                        +---------+---------------+---------+-----------+----------+--------------+    +---------+---------------+---------+-----------+----------+--------------+ LEFT     CompressibilityPhasicitySpontaneityPropertiesThrombus Aging +---------+---------------+---------+-----------+----------+--------------+ CFV      Full           No       Yes                                 +---------+---------------+---------+-----------+----------+--------------+ SFJ      Full                                                        +---------+---------------+---------+-----------+----------+--------------+  FV Prox  Full                                                        +---------+---------------+---------+-----------+----------+--------------+ FV Mid   Full                                                        +---------+---------------+---------+-----------+----------+--------------+ FV DistalFull                                                        +---------+---------------+---------+-----------+----------+--------------+ PFV      Full                                                        +---------+---------------+---------+-----------+----------+--------------+ POP      Full           No       Yes                                 +---------+---------------+---------+-----------+----------+--------------+ PTV      Full                                                        +---------+---------------+---------+-----------+----------+--------------+ PERO     Full                                                        +---------+---------------+---------+-----------+----------+--------------+     Summary: RIGHT: - There is no evidence of deep vein thrombosis in the lower extremity.  - No cystic structure found in the popliteal fossa. - Pulsatile waveforms suggestive of elevated right heart pressures.  LEFT: - There is no evidence of deep vein thrombosis in the lower extremity.  - No cystic structure found in the popliteal fossa. -  Pulsatile waveforms suggestive of elevated right heart pressures.  *See table(s) above for measurements and observations. Electronically signed by Harold Barban MD on 05/04/2021 at 7:49:04 PM.    Final     Cardiac Studies   2D echo 07/2020   1. Left ventricular ejection fraction, by estimation, is 35 to 40%. The  left ventricle has moderately decreased function. The left ventricle has  no regional wall motion abnormalities. Left ventricular diastolic function  could not be evaluated.   2. Left atrial size was moderate to severe.   3. The mitral valve is normal in structure. Mild mitral valve  regurgitation. No evidence of  mitral stenosis.   4. 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   Patient Profile     70 y.o. 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 Admitted with worsening heart failure, elevated troponin and RBBB.  Assessment & Plan    1. Acute on chronic systolic CHF/cardiomyopathy - presurgical echo 07/2020 demonstrated new low EF 35-40%, nuclear stress test same time showed small inferior basal wall infarct but no ischemia, study deemed high risk due to EF 21% - although nuclear stress test was negative for acute ischemia, given known CAD and multiple cardiac risk factors with worsening HF symptoms, agree with plan for cardiac catheterization as outlined - weights do not appear accurate, -1.7L thus far - hold Lasix in prep for cath - per OP notes patient could not tolerate Entresto and did not wish to try [place] - was on chlorthalidone but not taking telmisartan PTA - hold off ACEi/ARB/ARNI/spironolactone given renal dysfunction and cath - repeat echo pending - patient wants stepson called with results of cath -  Mabe,Will Son [phone]     2. CAD with elevated troponin/possible NSTEMI - continue ASA -> move to AM dosing given hospital scheduling - continue rosuvastatin - home Plavix on hold, pending decision post cath - not currently on heparin - given platelet count and plan for cath today will hold off initiation - no need to continue to trend troponins - check lipids in AM  3. Abnormal EKG with RBBB, first degree AVB, LFPB - predominantly NSR with RBBB/1st degree AVB; rare type 1 second degree AVB, brief period this AM of variable conduction (?AV dissociation without bradycardia) -reviewed with MD - continue to monitor for worsening signs of conduction disease - avoid AVN blocking agents at this time  4. Paroxysmal atrial fibrillation - as noted above, patient switched back to Plavix due to financial cost and also did not feel like he understood the rationale for why the switch  - will place care management consult to assess for cost difference in Eliquis vs Xarelto - will need to determine post-cath plan for anticoagulation/antiplatelet therapy given CHADSVASC 7  5. CKD stage IIIb - baseline Cr previously 1.77, variable here from 1.5-1.9 but maintaining range - continue to follow closely  6. Hypokalemia - receiving intermittent potassium repletion in the context of diuresis, with 36meq nightly - holding Lasix today, will give 83meq this AM followed by 71meq 4 hours later - daily BMET/Mg  7. Thrombocytopenia - previous mildly decreased platelet count noted to 120s-130s - 122->96->104 here - follow - consider OP heme eval  8. 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 DM  - per IM  10. Depression - appreciate medicine assistance with this - also discussed that medication intolerance can sometimes be a sign of prior underlying emotional trauma that remains unaddressed  For questions or updates, please contact [place] Please consult [email or web address] for contact info under  Cardiology/STEMI.  Signed, Charlie Pitter, PA-C 05/05/2021, 8:23 AM    Personally seen and examined. Agree with above.  Acute on chronic systolic heart failure with EF of 35 to 40% with underlying nonobstructive CAD on prior heart catheterization - We are proceeding with cath today.  Need to define anatomy.  Previously had a 40% left main lesion  as well as 70% mid LAD.  Previous RCA stents.  If this is significant, may require bypass surgery or PCI.  He feels better.  Appreciate our care.  Creatinine is increased but likely at new normal.  Candee Furbish, MD

## 2021-05-05 NOTE — Interval H&P Note (Signed)
Cath Lab Visit (complete for each Cath Lab visit)  Clinical Evaluation Leading to the Procedure:   ACS: No.  Non-ACS:    Anginal Classification: CCS III  Anti-ischemic medical therapy: No Therapy  Non-Invasive Test Results: No non-invasive testing performed  Prior CABG: No previous CABG      History and Physical Interval Note:  05/05/2021 2:52 PM  [name] [name]  has presented today for surgery, with the diagnosis of heart failure - low EF.  The various methods of treatment have been discussed with the patient and family. After consideration of risks, benefits and other options for treatment, the patient has consented to  Procedure(s): LEFT HEART CATH AND CORONARY ANGIOGRAPHY (N/A) as a surgical intervention.  The patient's history has been reviewed, patient examined, no change in status, stable for surgery.  I have reviewed the patient's chart and labs.  Questions were answered to the patient's satisfaction.     Shelva Majestic

## 2021-05-05 NOTE — Progress Notes (Addendum)
Progress Note  Patient Name: [name] [name] Date of Encounter: 05/05/2021  Primary Cardiologist: Jenean Lindau, MD  Subjective   Feeling somewhat better. Edema improved per patient but very significant on exam.  I clarified the Plavix/Eliquis question with the patient - was on Plavix since 2009 for stroke then prescribed Eliquis by our office when atrial fib was identified. The patient did not feel like he had an adequate explanation/understanding of why this was prescribed. He was also very concerned with the expense when the Plavix was something he did well on for years. He also did not tolerate Entresto due to hypotension. He was concerned about what the motivation was behind prescribing these medications and possible kickbacks from the pharmaceutical companies. We had discussion about the severity of congestive heart failure and the rationale for treatment and frequent medication changes to help improve both symptoms and longevity. We also discussed the rationale for DOAC over Plavix. He appreciates being involved in the decision making and explanations of his healthcare.  He also reports he has been living by himself and struggling with some depression. Has worked with primary care on this but found it difficult to tolerate any of the antidepressants so far.  Inpatient Medications    Scheduled Meds:  aspirin EC  81 mg Oral QHS   cyanocobalamin  1,000 mcg Intramuscular Q30 days   enoxaparin (LOVENOX) injection  40 mg Subcutaneous Q24H   insulin aspart  0-9 Units Subcutaneous TID AC & HS   insulin glargine  25 Units Subcutaneous Q2200   lidocaine  1 patch Transdermal Q24H   potassium chloride  40 mEq Oral Once   Followed by   potassium chloride  20 mEq Oral Once   rosuvastatin  20 mg Oral QHS   sodium chloride flush  3 mL Intravenous Q12H   sodium chloride flush  3 mL Intravenous Q12H   Continuous Infusions:  sodium chloride     sodium chloride     sodium chloride 10  mL/hr at 05/05/21 0707   PRN Meds: sodium chloride, sodium chloride, acetaminophen, albuterol, ALPRAZolam, fluticasone, HYDROmorphone, magnesium hydroxide, ondansetron (ZOFRAN) IV, sodium chloride flush, sodium chloride flush, traMADol, zolpidem   Vital Signs    Vitals:   05/04/21 2101 05/05/21 0122 05/05/21 0500 05/05/21 0555  BP: 109/77 122/85  123/89  Pulse: 75 84  72  Resp: 18 18  18   Temp: 98 F (36.7 C) 97.6 F (36.4 C)  97.7 F (36.5 C)  TempSrc: Oral Oral  Oral  SpO2: 97% 97%  96%  Weight:   104.3 kg   Height:        Intake/Output Summary (Last 24 hours) at 05/05/2021 0823 Last data filed at 05/05/2021 0600 Gross per 24 hour  Intake --  Output 300 ml  Net -300 ml   Last 3 Weights 05/05/2021 05/04/2021 01/04/2021  Weight (lbs) 229 lb 15 oz 212 lb 1.3 oz 212 lb 1.3 oz  Weight (kg) 104.3 kg 96.2 kg 96.199 kg     Telemetry    Predominantly NSR with RBBB/1st degree AVB; rare type 1 second degree AVB, brief period this AM of variable conduction (?AV dissociation without bradycardia) - Personally Reviewed  Physical Exam   GEN: No acute distress.  HEENT: Normocephalic, atraumatic, sclera non-icteric. Neck: No JVD or bruits. Cardiac: RRR no murmurs, rubs, or gallops.  Respiratory: Crackles L base otherwise coarse BS to auscultation bilaterally. No wheezing, rhonchi. Breathing is unlabored. GI: Soft, nontender, non-distended, BS +x 4. MS:  no deformity. Extremities: No clubbing or cyanosis. 3+ BLE edema. Distal pedal pulses are 2+ and equal bilaterally. Neuro:  AAOx3. Follows commands. Psych:  Responds to questions appropriately with a normal affect.  Labs    High Sensitivity Troponin:   Recent Labs  Lab 05/04/21 0432 05/04/21 1155 05/04/21 1419 05/05/21 0500  TROPONINIHS 176* 252* 236* 221*      Cardiac EnzymesNo results for input(s): TROPONINI in the last 168 hours. No results for input(s): TROPIPOC in the last 168 hours.   Chemistry Recent Labs  Lab  05/04/21 1155 05/04/21 1419 05/05/21 0500  NA 140 141 141  K 2.9* 3.3* 3.1*  CL 102 99 101  CO2 29 33* 32  GLUCOSE 149* 105* 127*  BUN 21 22 26*  CREATININE 1.53* 1.85* 1.92*  CALCIUM 9.1 9.1 8.9  PROT 6.6  --  6.4*  ALBUMIN 3.6  --  3.6  AST 23  --  23  ALT 13  --  14  ALKPHOS 84  --  90  BILITOT 2.5*  --  1.7*  GFRNONAA 49* 39* 37*  ANIONGAP 9 9 8      Hematology Recent Labs  Lab 05/03/21 2104 05/04/21 0432 05/05/21 0500  WBC 5.8 5.1 5.0  RBC 5.74 5.41 5.14  HGB 15.0 14.3 13.7  HCT 48.6 45.3 43.9  MCV 84.7 83.7 85.4  MCH 26.1 26.4 26.7  MCHC 30.9 31.6 31.2  RDW 15.0 15.1 15.2  PLT 122* 96* 104*    BNP Recent Labs  Lab 05/03/21 2105  BNP 646.8*     DDimer No results for input(s): DDIMER in the last 168 hours.   Radiology    DG Chest 2 View  Result Date: 05/03/2021 CLINICAL DATA:  Shortness of breath EXAM: CHEST - 2 VIEW COMPARISON:  August 17, 2016 FINDINGS: There is mild left base atelectasis. The lungs elsewhere are clear. Heart is borderline enlarged with pulmonary vascularity normal. No adenopathy. No bone lesions. IMPRESSION: Left base atelectasis. No frank edema or consolidation. Heart borderline enlarged. Electronically Signed   By: Lowella Grip III M.D.   On: 05/03/2021 21:09   VAS [place] LOWER EXTREMITY VENOUS (DVT)  Result Date: 05/04/2021  Lower Venous DVT Study Patient Name:  BROOKE PAYES  Date of Exam:   05/04/2021 Medical Rec #: [id number]           Accession #:    [id number] Date of Birth: January 30, 1951           Patient Gender: M Patient Age:   70Y Exam Location:  [facility] Procedure:      VAS [place] LOWER EXTREMITY VENOUS (DVT) Referring Phys: [id number] CALDWELL POWELL JR --------------------------------------------------------------------------------  Indications: Edema.  Comparison Study: No prior studies. Performing Technologist: Darlin Coco RDMS,RVT  Examination Guidelines: A complete evaluation includes B-mode imaging, spectral  Doppler, color Doppler, and power Doppler as needed of all accessible portions of each vessel. Bilateral testing is considered an integral part of a complete examination. Limited examinations for reoccurring indications may be performed as noted. The reflux portion of the exam is performed with the patient in reverse Trendelenburg.  +---------+---------------+---------+-----------+----------+--------------+ RIGHT    CompressibilityPhasicitySpontaneityPropertiesThrombus Aging +---------+---------------+---------+-----------+----------+--------------+ CFV      Full           No       Yes                                 +---------+---------------+---------+-----------+----------+--------------+  SFJ      Full                                                        +---------+---------------+---------+-----------+----------+--------------+ FV Prox  Full                                                        +---------+---------------+---------+-----------+----------+--------------+ FV Mid   Full                                                        +---------+---------------+---------+-----------+----------+--------------+ FV DistalFull                                                        +---------+---------------+---------+-----------+----------+--------------+ PFV      Full                                                        +---------+---------------+---------+-----------+----------+--------------+ POP      Full           No       Yes                                 +---------+---------------+---------+-----------+----------+--------------+ PTV      Full                                                        +---------+---------------+---------+-----------+----------+--------------+ PERO     Full                                                        +---------+---------------+---------+-----------+----------+--------------+    +---------+---------------+---------+-----------+----------+--------------+ LEFT     CompressibilityPhasicitySpontaneityPropertiesThrombus Aging +---------+---------------+---------+-----------+----------+--------------+ CFV      Full           No       Yes                                 +---------+---------------+---------+-----------+----------+--------------+ SFJ      Full                                                        +---------+---------------+---------+-----------+----------+--------------+  FV Prox  Full                                                        +---------+---------------+---------+-----------+----------+--------------+ FV Mid   Full                                                        +---------+---------------+---------+-----------+----------+--------------+ FV DistalFull                                                        +---------+---------------+---------+-----------+----------+--------------+ PFV      Full                                                        +---------+---------------+---------+-----------+----------+--------------+ POP      Full           No       Yes                                 +---------+---------------+---------+-----------+----------+--------------+ PTV      Full                                                        +---------+---------------+---------+-----------+----------+--------------+ PERO     Full                                                        +---------+---------------+---------+-----------+----------+--------------+     Summary: RIGHT: - There is no evidence of deep vein thrombosis in the lower extremity.  - No cystic structure found in the popliteal fossa. - Pulsatile waveforms suggestive of elevated right heart pressures.  LEFT: - There is no evidence of deep vein thrombosis in the lower extremity.  - No cystic structure found in the popliteal fossa. -  Pulsatile waveforms suggestive of elevated right heart pressures.  *See table(s) above for measurements and observations. Electronically signed by Harold Barban MD on 05/04/2021 at 7:49:04 PM.    Final     Cardiac Studies   2D echo 07/2020   1. Left ventricular ejection fraction, by estimation, is 35 to 40%. The  left ventricle has moderately decreased function. The left ventricle has  no regional wall motion abnormalities. Left ventricular diastolic function  could not be evaluated.   2. Left atrial size was moderate to severe.   3. The mitral valve is normal in structure. Mild mitral valve  regurgitation. No evidence of  mitral stenosis.   4. 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   Patient Profile     70 y.o. 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 Admitted with worsening heart failure, elevated troponin and RBBB.  Assessment & Plan    1. Acute on chronic systolic CHF/cardiomyopathy - presurgical echo 07/2020 demonstrated new low EF 35-40%, nuclear stress test same time showed small inferior basal wall infarct but no ischemia, study deemed high risk due to EF 21% - although nuclear stress test was negative for acute ischemia, given known CAD and multiple cardiac risk factors with worsening HF symptoms, agree with plan for cardiac catheterization as outlined - weights do not appear accurate, -1.7L thus far - hold Lasix in prep for cath - per OP notes patient could not tolerate Entresto and did not wish to try [place] - was on chlorthalidone but not taking telmisartan PTA - hold off ACEi/ARB/ARNI/spironolactone given renal dysfunction and cath - repeat echo pending - patient wants stepson called with results of cath -  Mabe,Will Son [phone]     2. CAD with elevated troponin/possible NSTEMI - continue ASA -> move to AM dosing given hospital scheduling - continue rosuvastatin - home Plavix on hold, pending decision post cath - not currently on heparin - given platelet count and plan for cath today will hold off initiation - no need to continue to trend troponins - check lipids in AM  3. Abnormal EKG with RBBB, first degree AVB, LFPB - predominantly NSR with RBBB/1st degree AVB; rare type 1 second degree AVB, brief period this AM of variable conduction (?AV dissociation without bradycardia) -reviewed with MD - continue to monitor for worsening signs of conduction disease - avoid AVN blocking agents at this time  4. Paroxysmal atrial fibrillation - as noted above, patient switched back to Plavix due to financial cost and also did not feel like he understood the rationale for why the switch  - will place care management consult to assess for cost difference in Eliquis vs Xarelto - will need to determine post-cath plan for anticoagulation/antiplatelet therapy given CHADSVASC 7  5. CKD stage IIIb - baseline Cr previously 1.77, variable here from 1.5-1.9 but maintaining range - continue to follow closely  6. Hypokalemia - receiving intermittent potassium repletion in the context of diuresis, with 51meq nightly - holding Lasix today, will give 37meq this AM followed by 82meq 4 hours later - daily BMET/Mg  7. Thrombocytopenia - previous mildly decreased platelet count noted to 120s-130s - 122->96->104 here - follow - consider OP heme eval  8. 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 DM  - per IM  10. Depression - appreciate medicine assistance with this - also discussed that medication intolerance can sometimes be a sign of prior underlying emotional trauma that remains unaddressed  For questions or updates, please contact [facility] Please consult [email or web address] for contact info under  Cardiology/STEMI.  Signed, Charlie Pitter, PA-C 05/05/2021, 8:23 AM    Personally seen and examined. Agree with above.  Acute on chronic systolic heart failure with EF of 35 to 40% with underlying nonobstructive CAD on prior heart catheterization - We are proceeding with cath today.  Need to define anatomy.  Previously had a 40% left main lesion  as well as 70% mid LAD.  Previous RCA stents.  If this is significant, may require bypass surgery or PCI.  He feels better.  Appreciate our care.  Creatinine is increased but likely at new normal.  Candee Furbish, MD

## 2021-05-06 ENCOUNTER — Telehealth: Payer: Self-pay | Admitting: Physician Assistant

## 2021-05-06 ENCOUNTER — Inpatient Hospital Stay (HOSPITAL_COMMUNITY): Payer: Medicare Other

## 2021-05-06 ENCOUNTER — Encounter (HOSPITAL_COMMUNITY): Payer: Self-pay | Admitting: Cardiovascular Disease

## 2021-05-06 DIAGNOSIS — I509 Heart failure, unspecified: Secondary | ICD-10-CM | POA: Diagnosis not present

## 2021-05-06 DIAGNOSIS — I5023 Acute on chronic systolic (congestive) heart failure: Secondary | ICD-10-CM | POA: Diagnosis not present

## 2021-05-06 LAB — CBC WITH DIFFERENTIAL/PLATELET
Abs Immature Granulocytes: 0.01 10*3/uL (ref 0.00–0.07)
Basophils Absolute: 0.1 10*3/uL (ref 0.0–0.1)
Basophils Relative: 1 %
Eosinophils Absolute: 0.1 10*3/uL (ref 0.0–0.5)
Eosinophils Relative: 3 %
HCT: 46.9 % (ref 39.0–52.0)
Hemoglobin: 14.2 g/dL (ref 13.0–17.0)
Immature Granulocytes: 0 %
Lymphocytes Relative: 20 %
Lymphs Abs: 1 10*3/uL (ref 0.7–4.0)
MCH: 26.2 pg (ref 26.0–34.0)
MCHC: 30.3 g/dL (ref 30.0–36.0)
MCV: 86.5 fL (ref 80.0–100.0)
Monocytes Absolute: 0.5 10*3/uL (ref 0.1–1.0)
Monocytes Relative: 10 %
Neutro Abs: 3.4 10*3/uL (ref 1.7–7.7)
Neutrophils Relative %: 66 %
Platelets: 108 10*3/uL — ABNORMAL LOW (ref 150–400)
RBC: 5.42 MIL/uL (ref 4.22–5.81)
RDW: 15.6 % — ABNORMAL HIGH (ref 11.5–15.5)
WBC: 5.1 10*3/uL (ref 4.0–10.5)
nRBC: 0 % (ref 0.0–0.2)

## 2021-05-06 LAB — ECHOCARDIOGRAM COMPLETE
Area-P 1/2: 2.37 cm2
Height: 73 in
S' Lateral: 4.4 cm
Weight: 3679.04 oz

## 2021-05-06 LAB — COMPREHENSIVE METABOLIC PANEL
ALT: 14 U/L (ref 0–44)
AST: 24 U/L (ref 15–41)
Albumin: 3.8 g/dL (ref 3.5–5.0)
Alkaline Phosphatase: 94 U/L (ref 38–126)
Anion gap: 9 (ref 5–15)
BUN: 31 mg/dL — ABNORMAL HIGH (ref 8–23)
CO2: 28 mmol/L (ref 22–32)
Calcium: 9 mg/dL (ref 8.9–10.3)
Chloride: 104 mmol/L (ref 98–111)
Creatinine, Ser: 1.86 mg/dL — ABNORMAL HIGH (ref 0.61–1.24)
GFR, Estimated: 39 mL/min — ABNORMAL LOW (ref >60)
Glucose, Bld: 117 mg/dL — ABNORMAL HIGH (ref 70–99)
Potassium: 3.7 mmol/L (ref 3.5–5.1)
Sodium: 141 mmol/L (ref 135–145)
Total Bilirubin: 1.7 mg/dL — ABNORMAL HIGH (ref 0.3–1.2)
Total Protein: 6.6 g/dL (ref 6.5–8.1)

## 2021-05-06 LAB — MAGNESIUM: Magnesium: 2.2 mg/dL (ref 1.7–2.4)

## 2021-05-06 LAB — GLUCOSE, CAPILLARY
Glucose-Capillary: 91 mg/dL (ref 70–99)
Glucose-Capillary: 112 mg/dL — ABNORMAL HIGH (ref 70–99)

## 2021-05-06 MED ORDER — APIXABAN 5 MG PO TABS
5.0000 mg | ORAL_TABLET | Freq: Two times a day (BID) | ORAL | Status: DC
  Administered 2021-05-06: 5 mg via ORAL
  Filled 2021-05-06: qty 1

## 2021-05-06 MED ORDER — DAPAGLIFLOZIN PROPANEDIOL 10 MG PO TABS: 10.0000 mg | ORAL_TABLET | Freq: Every day | ORAL | 0 refills | Status: DC

## 2021-05-06 MED ORDER — ROSUVASTATIN CALCIUM 40 MG PO TABS: 40.0000 mg | ORAL_TABLET | Freq: Every day | ORAL | 0 refills | Status: DC

## 2021-05-06 MED ORDER — DAPAGLIFLOZIN PROPANEDIOL 10 MG PO TABS
10.0000 mg | ORAL_TABLET | Freq: Every day | ORAL | Status: DC
  Administered 2021-05-06: 10 mg via ORAL
  Filled 2021-05-06: qty 1

## 2021-05-06 MED ORDER — POTASSIUM CHLORIDE CRYS ER 20 MEQ PO TBCR
20.0000 meq | EXTENDED_RELEASE_TABLET | Freq: Every day | ORAL | Status: DC
  Administered 2021-05-06: 20 meq via ORAL
  Filled 2021-05-06: qty 1

## 2021-05-06 MED ORDER — POTASSIUM CHLORIDE CRYS ER 20 MEQ PO TBCR: 20.0000 meq | EXTENDED_RELEASE_TABLET | Freq: Every day | ORAL | 0 refills | Status: DC

## 2021-05-06 MED ORDER — METOPROLOL SUCCINATE ER 25 MG PO TB24: 25.0000 mg | ORAL_TABLET | Freq: Every day | ORAL | Status: DC

## 2021-05-06 MED ORDER — FUROSEMIDE 40 MG PO TABS: 40.0000 mg | ORAL_TABLET | Freq: Every day | ORAL | 0 refills | Status: DC

## 2021-05-06 MED ORDER — APIXABAN 5 MG PO TABS: 5.0000 mg | ORAL_TABLET | Freq: Two times a day (BID) | ORAL | 0 refills | Status: AC

## 2021-05-06 MED ORDER — FUROSEMIDE 40 MG PO TABS
40.0000 mg | ORAL_TABLET | Freq: Every day | ORAL | Status: DC
  Administered 2021-05-06: 40 mg via ORAL
  Filled 2021-05-06: qty 1

## 2021-05-06 NOTE — Telephone Encounter (Addendum)
    Attention TOC pool,  This patient will need a TOC phone call after discharge. I do not yet have a discharge date, so please follow for date of discharge to ensure he gets the phone call.   Follow-up appointment has already been arranged with:   [facility] 6/22 Dr. Geraldo Pitter on 7/1. They are a patient of Jenean Lindau, MD. Also being referred to the A[facility] clinic  I was not sure if [facility] utilizes [facility] pool so also cc'd to [facility] just in case. Thanks.  Thank you! Charlie Pitter, PA-C

## 2021-05-06 NOTE — Progress Notes (Signed)
AVS given to patient and explained at the bedside. Medications and follow up appointments have been explained with pt verbalizing understanding.  

## 2021-05-06 NOTE — Progress Notes (Addendum)
Progress Note  Patient Name: [name] [name] Date of Encounter: 05/06/2021  Primary Cardiologist: Jenean Lindau, MD  Subjective   Feeling much better today, wants to go home. Still with significant edema on exam. Cath results reviewed. At home he states he was on chlorthalidone for diuretic with PRN Lasix. Chlorthalidone did not produce the diuretic effect that Lasix has done.  Weight not yet available today, I/O's incomplete. Care order written to weigh.  Inpatient Medications    Scheduled Meds:  aspirin EC  81 mg Oral Daily   cyanocobalamin  1,000 mcg Intramuscular Q30 days   enoxaparin (LOVENOX) injection  40 mg Subcutaneous Q24H   insulin aspart  0-9 Units Subcutaneous TID AC & HS   insulin glargine  25 Units Subcutaneous Q2200   lidocaine  1 patch Transdermal Q24H   metoprolol succinate - ordered for this AM but did not receive yet - will d/c as below  25 mg Oral Daily   rosuvastatin  40 mg Oral QHS   sodium chloride flush  3 mL Intravenous Q12H   sodium chloride flush  3 mL Intravenous Q12H   sodium chloride flush  3 mL Intravenous Q12H   Continuous Infusions:  sodium chloride     sodium chloride     PRN Meds: sodium chloride, sodium chloride, acetaminophen, albuterol, diazepam, fluticasone, HYDROmorphone, magnesium hydroxide, ondansetron (ZOFRAN) IV, sodium chloride flush, sodium chloride flush, traMADol, zolpidem   Vital Signs    Vitals:   05/05/21 1653 05/05/21 1842 05/06/21 0028 05/06/21 0505  BP: (!) 122/93 113/84 117/81 112/84  Pulse: 74 72 78 67  Resp: (!) 24  20 20   Temp:  98.1 F (36.7 C) 98.9 F (37.2 C) (!) 97.5 F (36.4 C)  TempSrc:    Oral  SpO2: 94% 100% 98% 95%  Weight:      Height:        Intake/Output Summary (Last 24 hours) at 05/06/2021 0941 Last data filed at 05/06/2021 0538 Gross per 24 hour  Intake 180.92 ml  Output 200 ml  Net -19.08 ml   Last 3 Weights 05/05/2021 05/04/2021 01/04/2021  Weight (lbs) 229 lb 15 oz 212 lb 1.3  oz 212 lb 1.3 oz  Weight (kg) 104.3 kg 96.2 kg 96.199 kg     Telemetry    NSR first degree AVB - Personally Reviewed  Physical Exam   GEN: No acute distress.  HEENT: Normocephalic, atraumatic, sclera non-icteric. Neck: No JVD or bruits. Cardiac: RRR no murmurs, rubs, or gallops.  Respiratory: Mildly decreased BS R Base. Otherwise clear to auscultation bilaterally. Breathing is unlabored. GI: Soft, nontender, non-distended, BS +x 4. MS: no deformity. Extremities: No clubbing or cyanosis. 2+ taut BLE edema. Right radial cath site without hematoma or ecchymosis; good pulse. Right radial cath site without hematoma, + prior oozing onto gauze and soft ecchymosis; good pulse. Neuro:  AAOx3. Follows commands. Psych:  Responds to questions appropriately with a normal affect. Good spirits today.  Labs    High Sensitivity Troponin:   Recent Labs  Lab 05/04/21 0432 05/04/21 1155 05/04/21 1419 05/05/21 0500  TROPONINIHS 176* 252* 236* 221*      Cardiac EnzymesNo results for input(s): TROPONINI in the last 168 hours. No results for input(s): TROPIPOC in the last 168 hours.   Chemistry Recent Labs  Lab 05/04/21 1155 05/04/21 1419 05/05/21 0500 05/06/21 0450  NA 140 141 141 141  K 2.9* 3.3* 3.1* 3.7  CL 102 99 101 104  CO2 29 33*  32 28  GLUCOSE 149* 105* 127* 117*  BUN 21 22 26* 31*  CREATININE 1.53* 1.85* 1.92* 1.86*  CALCIUM 9.1 9.1 8.9 9.0  PROT 6.6  --  6.4* 6.6  ALBUMIN 3.6  --  3.6 3.8  AST 23  --  23 24  ALT 13  --  14 14  ALKPHOS 84  --  90 94  BILITOT 2.5*  --  1.7* 1.7*  GFRNONAA 49* 39* 37* 39*  ANIONGAP 9 9 8 9      Hematology Recent Labs  Lab 05/04/21 0432 05/05/21 0500 05/06/21 0450  WBC 5.1 5.0 5.1  RBC 5.41 5.14 5.42  HGB 14.3 13.7 14.2  HCT 45.3 43.9 46.9  MCV 83.7 85.4 86.5  MCH 26.4 26.7 26.2  MCHC 31.6 31.2 30.3  RDW 15.1 15.2 15.6*  PLT 96* 104* 108*    BNP Recent Labs  Lab 05/03/21 2105  BNP 646.8*     DDimer No results for  input(s): DDIMER in the last 168 hours.   Radiology    CARDIAC CATHETERIZATION  Result Date: 05/05/2021  Prox RCA to Mid RCA lesion is 5% stenosed.  Prox RCA lesion is 25% stenosed.  Dist RCA lesion is 70% stenosed.  Mid LM to Dist LM lesion is 25% stenosed.  Ost Cx lesion is 20% stenosed.  Prox Cx to Mid Cx lesion is 30% stenosed.  Prox LAD to Mid LAD lesion is 20% stenosed.  1st Diag lesion is 30% stenosed.  Mid LAD lesion is 50% stenosed.  There is evidence for moderate coronary calcification involving predominantly the LAD and RCA.  The left main has smooth 25% mid stenosis; there is calcification in the proximal LAD with narrowing of 25% followed by 40-50% mid stenosis and 30% diagonal stenosis; the circumflex ostium has 20% stenosis with 30% mid stenosis; the tandem RCA stents are widely patent with minimal intimal hyperplasia. There is 30% stenosis proximal to the stent.  There is 65 to 70% stenosis in the distal RCA beyond the acute margin before the PDA takeoff. LVEDP 19 mm Hg.  RECOMMENDATION: Hydration post cath with creatinine 1.92.  Recommend initiation of antianginal  medical therapy.  Aggressive  lipid-lowering therapy; recommend titration of rosuvastatin to 40 mg.   VAS [place] LOWER EXTREMITY VENOUS (DVT)  Result Date: 05/04/2021  Lower Venous DVT Study Patient Name:  [name] [name]  Date of Exam:   05/04/2021 Medical Rec #: [id number]           Accession #:    [id number] Date of Birth: 02-02-1951           Patient Gender: M Patient Age:   70Y Exam Location:  [facility] Procedure:      VAS [place] LOWER EXTREMITY VENOUS (DVT) Referring Phys: [id number] A CALDWELL POWELL JR --------------------------------------------------------------------------------  Indications: Edema.  Comparison Study: No prior studies. Performing Technologist: Darlin Coco RDMS,RVT  Examination Guidelines: A complete evaluation includes B-mode imaging, spectral Doppler, color Doppler, and power Doppler as  needed of all accessible portions of each vessel. Bilateral testing is considered an integral part of a complete examination. Limited examinations for reoccurring indications may be performed as noted. The reflux portion of the exam is performed with the patient in reverse Trendelenburg.  +---------+---------------+---------+-----------+----------+--------------+ RIGHT    CompressibilityPhasicitySpontaneityPropertiesThrombus Aging +---------+---------------+---------+-----------+----------+--------------+ CFV      Full           No       Yes                                 +---------+---------------+---------+-----------+----------+--------------+  SFJ      Full                                                        +---------+---------------+---------+-----------+----------+--------------+ FV Prox  Full                                                        +---------+---------------+---------+-----------+----------+--------------+ FV Mid   Full                                                        +---------+---------------+---------+-----------+----------+--------------+ FV DistalFull                                                        +---------+---------------+---------+-----------+----------+--------------+ PFV      Full                                                        +---------+---------------+---------+-----------+----------+--------------+ POP      Full           No       Yes                                 +---------+---------------+---------+-----------+----------+--------------+ PTV      Full                                                        +---------+---------------+---------+-----------+----------+--------------+ PERO     Full                                                        +---------+---------------+---------+-----------+----------+--------------+    +---------+---------------+---------+-----------+----------+--------------+ LEFT     CompressibilityPhasicitySpontaneityPropertiesThrombus Aging +---------+---------------+---------+-----------+----------+--------------+ CFV      Full           No       Yes                                 +---------+---------------+---------+-----------+----------+--------------+ SFJ      Full                                                        +---------+---------------+---------+-----------+----------+--------------+  FV Prox  Full                                                        +---------+---------------+---------+-----------+----------+--------------+ FV Mid   Full                                                        +---------+---------------+---------+-----------+----------+--------------+ FV DistalFull                                                        +---------+---------------+---------+-----------+----------+--------------+ PFV      Full                                                        +---------+---------------+---------+-----------+----------+--------------+ POP      Full           No       Yes                                 +---------+---------------+---------+-----------+----------+--------------+ PTV      Full                                                        +---------+---------------+---------+-----------+----------+--------------+ PERO     Full                                                        +---------+---------------+---------+-----------+----------+--------------+     Summary: RIGHT: - There is no evidence of deep vein thrombosis in the lower extremity.  - No cystic structure found in the popliteal fossa. - Pulsatile waveforms suggestive of elevated right heart pressures.  LEFT: - There is no evidence of deep vein thrombosis in the lower extremity.  - No cystic structure found in the popliteal fossa. -  Pulsatile waveforms suggestive of elevated right heart pressures.  *See table(s) above for measurements and observations. Electronically signed by Harold Barban MD on 05/04/2021 at 7:49:04 PM.    Final     Cardiac Studies   LHC 05/05/21  Prox RCA to Mid RCA lesion is 5% stenosed. Prox RCA lesion is 25% stenosed. Dist RCA lesion is 70% stenosed. Mid LM to Dist LM lesion is 25% stenosed. Ost Cx lesion is 20% stenosed. Prox Cx to Mid Cx lesion is 30% stenosed. Prox LAD to Mid LAD lesion is 20% stenosed. 1st Diag lesion is 30% stenosed. Mid LAD lesion is 50% stenosed.  There is evidence for moderate coronary calcification involving predominantly the LAD and RCA.     The left main has smooth 25% mid stenosis; there is calcification in the proximal LAD with narrowing of 25% followed by 40-50% mid stenosis and 30% diagonal stenosis; the circumflex ostium has 20% stenosis with 30% mid stenosis; the tandem RCA stents are widely patent with minimal intimal hyperplasia. There is 30% stenosis proximal to the stent.  There is 65 to 70% stenosis in the distal RCA beyond the acute margin before the PDA takeoff.   LVEDP 19 mm Hg.     RECOMMENDATION: Hydration post cath with creatinine 1.92.  Recommend initiation of antianginal  medical therapy.  Aggressive  lipid-lowering therapy; recommend titration of rosuvastatin to 40 mg.   2D echo - pending    Patient Profile     70 y.o. male with CAD (MI 2009 s/p DES to RCA with residual disease treated medically), development of cardiomyopathy in 2021 with EF 35-40% (previously normal in 2021), PAF, stroke, depression, HTN, HLD, DM, gout, kidney stones, remote tobacco abuse. Admitted with worsening heart failure, elevated troponin and RBBB.  He expresses a desire to be well-informed of why medications are being added at the time of change.  Assessment & Plan     1. Acute on chronic systolic CHF/cardiomyopathy - presurgical echo 07/2020 demonstrated new low  EF 35-40%, nuclear stress test same time showed small inferior basal wall infarct but no ischemia (high risk due to EF 21%) - LHC showed moderate coronary artery disease as outlined above but no significant obstruction requiring PCI, LVEDP 15mmHg - per discussion with Dr. Marlou Porch: - echo shows severe LV dysfunction as well as LV thickening. 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 The patient does not wish to remain inpatient any longer, prefers to be discharged where outpatient workup can continue. Have arranged close f/u in the HF [facility] clinic 6/22 as well as 2 week f/u with primary cardiologist to bridge care while awaiting referral to Advanced HF clinic. I sent message to AHF clinic to arrange - would send home with Lasix 40mg  daily / KCl 20 meq daily - hold off ACEI/ARB/ARNI/spironolactone at present time given soft BP - did not previously tolerate Entresto so consider ARB after discharge - initiate Farxiga 10mg  daily (patient agreeable) - can investigate cost options as OP if needed - no beta blocker due to suspicion for conduction disease as per #3 and intermittent 2nd degree type 1 AV block - will need PYP scan ordered in follow-up  2. CAD with elevated troponin/possible NSTEMI - continue rosuvastatin -lipid panel not drawn this AM for unclear reasons, can revisit as OP - LHC showed moderate coronary artery disease as outlined above but no significant obstruction requiring PCI, LVEDP 34mmHg - nurse order written to replace tegaderm with fresh dressing - prior oozing noted but no further bleeding this AM; radial site care added to AVS - per d/w MD, given thrombocytopenia, would hold off ASA/Plavix while on Eliquis   3. Abnormal EKG with RBBB, first degree AVB, LFPB - predominantly NSR with RBBB/1st degree AVB; rare type 1 second degree AVB, brief period this AM of variable conduction (?AV dissociation without bradycardia) -reviewed with MD - telemetry stable overnight  - continue to  monitor for worsening signs of conduction disease as outpatient - avoid AVN blocking agents at this time   4. Paroxysmal atrial fibrillation - as noted above, patient switched back to Plavix due to financial cost and also did not  feel like he understood the rationale for the switch - will discuss post-cath plan for anticoagulation/antiplatelet therapy given CHADSVASC 7 - per care management note "Co-Pay: Xarelto is $137.66 and Eliquis is $70.88 generic not on formulary" - patient agreeable to start Eliquis, will place consult per pharmacy which includes education   5. CKD stage IIIb - baseline Cr previously 1.77, variable here from 1.5-1.9 but maintaining within this range as likely new baseline at 1.86 - continue to follow closely   6. Hypokalemia - K improved, Mg wnl - potassium as above   7. Thrombocytopenia - previous mildly decreased platelet count noted to 120s-130s - 122->96->104->108 here - follow - consider OP heme eval   8. 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 DM - per IM   10. Depression - appreciate medicine assistance with this - also discussed that medication intolerance can sometimes be a sign of prior underlying emotional trauma that remains unaddressed, so would continue outpatient follow-up   For questions or updates, please contact [place] Please consult [email or web address] for contact info under Cardiology/STEMI.  Signed, Charlie Pitter, PA-C 05/06/2021, 9:41 AM    Personally seen and examined. Agree with above.  Echocardiogram demonstrated severely reduced ejection fraction in the 20% range with severe LVH, increased wall thickness of RV as well.  Discussed with him.  He is very eager to go home.  He does feel better he states.  Able to ambulate the hallway.  Currently dabbing his shin with blood on Kleenex.  Scratches.  His lower extremities still demonstrate 2-3+ edema.  We discussed further diuresis here in the hospital.   This has been challenging due to hypotension.  He states that he wishes to go home.  At the least, we would like to go ahead and try to get him started on either [place] or Jardiance.  Perhaps he could tolerate Verquvo as an outpatient.  Blood pressure too low for Entresto.  Also metoprolol was stopped because of first-degree AV block, Wenckebach.  Also given his paroxysmal atrial fibrillation, request that he take either Eliquis or Xarelto.  Eliquis apparently is less expensive for him.  He is willing to try.  In all, we will get him set up with heart failure, advanced heart failure TOC clinic.  I think a PYP scan looking for amyloid makes good sense.  Candee Furbish, MD

## 2021-05-06 NOTE — Progress Notes (Signed)
  Echocardiogram 2D Echocardiogram has been performed.  [name] [name] 05/06/2021, 9:00 AM

## 2021-05-06 NOTE — Telephone Encounter (Signed)
**Note De-Identified Pollie Poma Obfuscation** The pts post hospital f/u is with Dr Sherral Hammers at our [facility] office. Forwarding this message to rhe [facility] triage and TOC  pool.

## 2021-05-06 NOTE — Discharge Summary (Addendum)
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  - no ace/arb/arni/spiro given soft BP Started on eliquis, follow outpatient  Follow mood outpatient  Needs PYP scan outpatient Follow BG's outpatient with addition of farxiga  Discharge Diagnoses:  Active Problems:   Acute on chronic systolic CHF (congestive heart failure) (HCC)   Acute on chronic congestive heart failure [facility])   Discharge Condition: stable  Diet recommendation: heart healthy, diabetic  Filed Weights   05/04/21 0325 05/05/21 0500 05/06/21 1107  Weight: 96.2 kg 104.3 kg 105.9 kg    History of present illness:  [name] [name] is [name] 70 y.o. Caucasian male with medical history significant for multiple medical problems that are mentioned below, who presented to the emergency room with acute onset of generalized weakness over the last couple of days with associated dyspnea as well as orthopnea and paroxysmal nocturnal dyspnea with significantly worsening lower extremity edema for the last few days.  He admitted to dyspnea on exertion.  He denies any fever or chills.  He has been having dry cough and occasional wheezing.  No chest pain or palpitations.  No nausea or vomiting or abdominal pain.  No dysuria, oliguria or hematuria or flank pain.   ED Course: When he came to the ER blood pressure was 136/91 with otherwise normal vital signs.  Labs revealed hypokalemia and Noelene Gang creatinine of 1.8 with Roshanda Balazs BUN of 23 compared with Daemon Dowty creatinine of 1.49compared with Lydia Toren BUN of 27 on 12/04/2020.  CBC was unremarkable.  BNP  was 646.8. EKG as reviewed by me : Showed sinus rhythm with rate of 85 with prolonged PR interval, right bundle branch block and left posterior fascicular block. Imaging:  Two-view chest x-ray showed left basilar atelectasis with borderline cardiomegaly and no frank edema or consolidation.  The patient was given 40 mg of IV Lasix and 40 mill equivalent p.o. potassium chloride.  He will be admitted to [facility] telemetry bed for further evaluation and management.  He was admitted with Crestina Strike heart failure exacerbation.  Cardiology was c/s and he was diuresed.  Cardiac cath performed which showed moderate coronary calcification (see report).  Echo showed grade III diastolic dysfunction and decreased EF 20-25%.  Plan for discharge with close outpatient follow up on lasix, farxiga, and eliquis.   See below for additiona details  Hospital Course:  Acute on chronic systolic HF exacerbation Echo 07/2020 with EF 35-40% Echo 6/17 showing EF 20-25%, severely decreased function, global hypokinesis, grade III diastolic dysfunction.  RVSF mildly reduced.  See report CXR with L base atelectasis Appreciate cardiology recommendations -> planning for lasix 40 mg daily, kcl 20 meq daily, farxiga 10 mg daily.  Echo concerning for amyloid.  Needs PYP scan outpatient.  No beta blocker due to cocern for conduction disease and no ace/arb/arni/spiro due to concern for soft BP.   Follow LE [place] - negative for DVT Cardiology c/s, appreciate recs -> plan for cath 6/16 -> with moderate coronary calcification involving predominantly the LAD and RCA (see report) - recommending post cath hydration, antianginal medical therapy, aggressive lipid lowering therapy He was taking lasix only as needed, no longer taking  micardis   Elevated Troponin  Coronary Artery Disease New RBBB Echo as avive Suspect demand in setting of HF exacerbation Cardiology c/s, appreciate recommendations - recommendations as noted above   CKD IIIb Creatinine  relatively stable today post cath  Baseline appears to be ~1.5-1.7   History Atrial Fibrillation He stopped xarelto on his own accord Transition to eliquis at discharge No AV nodal blocker Cardiology following   Hypokalemia. Follow outpatient   Essential hypertension. Discharge with lasix, follow outpatient   Vitamin B12 deficiency. We will continue vitamin B12.  Type 2 diabetes mellitus. Resume home regimen   Dyslipidemia. We will continue his statin therapy.  Depression. We will continue Zoloft.  Procedures: Echo IMPRESSIONS     1. Left ventricular ejection fraction, by estimation, is 20 to 25%. The  left ventricle has severely decreased function. The left ventricle  demonstrates global hypokinesis. There is severe concentric left  ventricular hypertrophy. Left ventricular  diastolic parameters are consistent with Grade III diastolic dysfunction  (restrictive).   2. Right ventricular systolic function is mildly reduced. The right  ventricular size is normal. There is normal pulmonary artery systolic  pressure.   3. Left atrial size was mildly dilated.   4. Right atrial size was mild to moderately dilated.   5. The mitral valve is grossly normal. Mild mitral valve regurgitation.   6. The aortic valve is grossly normal. There is mild calcification of the  aortic valve. Aortic valve regurgitation is not visualized. No aortic  stenosis is present.   Cath Prox RCA to Mid RCA lesion is 5% stenosed. Prox RCA lesion is 25% stenosed. Dist RCA lesion is 70% stenosed. Mid LM to Dist LM lesion is 25% stenosed. Ost Cx lesion is 20% stenosed. Prox Cx to Mid Cx lesion is 30% stenosed. Prox LAD to Mid LAD lesion is 20% stenosed. 1st Diag lesion is 30% stenosed. Mid LAD lesion is 50% stenosed.   There is evidence for moderate coronary calcification involving predominantly the LAD and RCA.     The left main has smooth 25% mid stenosis; there is calcification in the  proximal LAD with narrowing of 25% followed by 40-50% mid stenosis and 30% diagonal stenosis; the circumflex ostium has 20% stenosis with 30% mid stenosis; the tandem RCA stents are widely patent with minimal intimal hyperplasia. There is 30% stenosis proximal to the stent.  There is 65 to 70% stenosis in the distal RCA beyond the acute margin before the PDA takeoff.   LVEDP 19 mm Hg.     RECOMMENDATION: Hydration post cath with creatinine 1.92.  Recommend initiation of antianginal  medical therapy.  Aggressive  lipid-lowering therapy; recommend titration of rosuvastatin to 40 mg.  LE [place] Summary:  RIGHT:  - There is no evidence of deep vein thrombosis in the lower extremity.     - No cystic structure found in the popliteal fossa.  - Pulsatile waveforms suggestive of elevated right heart pressures.     LEFT:  - There is no evidence of deep vein thrombosis in the lower extremity.     - No cystic structure found in the popliteal fossa.  - Pulsatile waveforms suggestive of elevated right heart pressures.   Consultations: cardiology  Discharge Exam: Vitals:   05/06/21 0028 05/06/21 0505  BP: 117/81 112/84  Pulse: 78 67  Resp: 20 20  Temp: 98.9 F (37.2 C) (!) 97.5 F (36.4 C)  SpO2: 98% 95%   No concerns Discussed discharge plans and recommendations - appreciative  of our care  General: No acute distress. Cardiovascular: Heart sounds show Halyn Flaugher regular rate, and rhythm.  Lungs: Clear to auscultation bilaterally  Abdomen: Soft, nontender, nondistended Neurological: Alert and oriented 3. Moves all extremities 4 . Cranial nerves II through XII grossly intact. Skin: Warm and dry. No rashes or lesions. Extremities: improved bilateral LE edema  Discharge Instructions   Discharge Instructions     (HEART FAILURE PATIENTS) Call MD:  Anytime you have any of the following symptoms: 1) 3 pound weight gain in 24 hours or 5 pounds in 1 week 2) shortness of breath, with or without Smayan Hackbart dry  hacking cough 3) swelling in the hands, feet or stomach 4) if you have to sleep on extra pillows at night in order to breathe.   Complete by: As directed    Avoid straining   Complete by: As directed    Call MD for:  difficulty breathing, headache or visual disturbances   Complete by: As directed    Call MD for:  extreme fatigue   Complete by: As directed    Call MD for:  hives   Complete by: As directed    Call MD for:  persistant dizziness or light-headedness   Complete by: As directed    Call MD for:  persistant nausea and vomiting   Complete by: As directed    Call MD for:  redness, tenderness, or signs of infection (pain, swelling, redness, odor or green/yellow discharge around incision site)   Complete by: As directed    Call MD for:  severe uncontrolled pain   Complete by: As directed    Call MD for:  temperature >100.4   Complete by: As directed    Diet - low sodium heart healthy   Complete by: As directed    Diet - low sodium heart healthy   Complete by: As directed    Discharge instructions   Complete by: As directed    You were seen for Pairlee Sawtell heart failure exacerbation.  You had Dory Demont cardiac catheterization that showed moderate coronary artery disease, but did not require intervention.  Your echo showed decreased ejection fraction (decreased pump or squeeze) and diastolic dysfunction (impaired relaxation).  Your echo was concerning for amyloid.  Please follow up with cardiology as an outpatient.  We'll send you home on lasix 40 mg daily, potassium 20 meq daily, farxiga 10 mg daily.  You'll need Lawayne Hartig PYP scan in follow up.    We've started you on eliquis for afib.  Watch your blood sugars with your new diabetes and heart failure medicine (farxiga).  You may need to adjust your insulin with your PCP.  Return for new, recurrent, or worsening symptoms.  Please ask your PCP to request records from this hospitalization so they know what was done and what the next steps will be.    Heart Failure patients record your daily weight using the same scale at the same time of day   Complete by: As directed    Increase activity slowly   Complete by: As directed    Increase activity slowly   Complete by: As directed    STOP any activity that causes chest pain, shortness of breath, dizziness, sweating, or exessive weakness   Complete by: As directed       Allergies as of 05/06/2021       Reactions   Allopurinol Other (See Comments)   Felt terrible   Aripiprazole Other (See Comments)   Felt terrible   Bupropion  Other (See Comments)   Felt bad   Buspirone Other (See Comments)   Felt bad   Desvenlafaxine Other (See Comments)   Made him hyper   Duloxetine Other (See Comments)   Knocked him out   Escitalopram Other (See Comments)   Felt like crap   Fish Oil    unknown   Fluticasone Furoate Other (See Comments)   Unknown    Hydroxyzine Hcl Other (See Comments)   Knocked him out   Nortriptyline Other (See Comments)   Ineffective   Paroxetine Hcl Other (See Comments)   unknown   Ramipril Cough   Sertraline Other (See Comments)   "felt plugged in"   Venlafaxine Other (See Comments)   Felt bad   Vilazodone Other (See Comments)   "felt weird"        Medication List     STOP taking these medications    aspirin 81 MG tablet   chlorthalidone 25 MG tablet Commonly known as: HYGROTON   clopidogrel 75 MG tablet Commonly known as: PLAVIX   doxycycline 100 MG tablet Commonly known as: VIBRA-TABS   Potassium 99 MG Tabs   rivaroxaban 20 MG Tabs tablet Commonly known as: XARELTO   telmisartan 80 MG tablet Commonly known as: MICARDIS       TAKE these medications    albuterol 108 (90 Base) MCG/ACT inhaler Commonly known as: VENTOLIN HFA Inhale 2 puffs into the lungs every 6 (six) hours as needed for shortness of breath.   ALPRAZolam 0.25 MG tablet Commonly known as: XANAX Take 0.25 mg by mouth at bedtime as needed for anxiety.   apixaban 5 MG  Tabs tablet Commonly known as: ELIQUIS Take 1 tablet (5 mg total) by mouth 2 (two) times daily.   Cialis 20 MG tablet Generic drug: tadalafil Take 20 mg by mouth daily as needed for erectile dysfunction.   colchicine 0.6 MG tablet Take 0.6 mg by mouth daily as needed. Gout flare up   cyanocobalamin 1000 MCG/ML injection Commonly known as: (VITAMIN B-12) Inject 1,000 mcg into the muscle every 30 (thirty) days.   dapagliflozin propanediol 10 MG Tabs tablet Commonly known as: FARXIGA Take 1 tablet (10 mg total) by mouth daily.   febuxostat 40 MG tablet Commonly known as: ULORIC Take 40 mg by mouth daily.   fluticasone 50 MCG/ACT nasal spray Commonly known as: FLONASE Place 1 spray into both nostrils daily as needed for allergies or rhinitis.   furosemide 40 MG tablet Commonly known as: LASIX Take 1 tablet (40 mg total) by mouth daily. What changed:  medication strength how much to take when to take this reasons to take this   Lantus SoloStar 100 UNIT/ML Solostar Pen Generic drug: insulin glargine Inject 21 Units into the skin at bedtime.   levalbuterol 45 MCG/ACT inhaler Commonly known as: XOPENEX HFA Inhale 1 puff into the lungs every 4 (four) hours as needed for wheezing.   potassium chloride SA 20 MEQ tablet Commonly known as: KLOR-CON Take 1 tablet (20 mEq total) by mouth daily.   rosuvastatin 40 MG tablet Commonly known as: CRESTOR Take 1 tablet (40 mg total) by mouth at bedtime. What changed:  medication strength how much to take   traMADol 50 MG tablet Commonly known as: ULTRAM Take 25 mg by mouth every 4 (four) hours as needed for pain or severe pain.       ASK your doctor about these medications    HYDROmorphone 2 MG tablet Commonly known as: DILAUDID Take  1 tablet (2 mg total) by mouth every 4 (four) hours as needed for severe pain.   lidocaine 5 % Commonly known as: LIDODERM PLACE 1 PATCH ONTO THE SKIN DAILY. REMOVE & DISCARD PATCH WITHIN  12 HOURS OR AS DIRECTED BY MD       Allergies  Allergen Reactions   Allopurinol Other (See Comments)    Felt terrible   Aripiprazole Other (See Comments)    Felt terrible   Bupropion Other (See Comments)    Felt bad   Buspirone Other (See Comments)    Felt bad   Desvenlafaxine Other (See Comments)    Made him hyper   Duloxetine Other (See Comments)    Knocked him out   Escitalopram Other (See Comments)    Felt like crap   Fish Oil     unknown   Fluticasone Furoate Other (See Comments)    Unknown    Hydroxyzine Hcl Other (See Comments)    Knocked him out   Nortriptyline Other (See Comments)    Ineffective   Paroxetine Hcl Other (See Comments)    unknown   Ramipril Cough   Sertraline Other (See Comments)    "felt plugged in"   Venlafaxine Other (See Comments)    Felt bad   Vilazodone Other (See Comments)    "felt weird"    Follow-up Information     [facility] Follow up.   Specialty: Cardiology Why: [facility] follow-up appointment has been arranged for you on Wednesday May 11, 2021 at 9:00 AM. Parking code for garage parking off [address] is [phone]. Valet also available. Bring all medicines with you. Contact information: [address] [phone] [place]        Revankar, Reita Cliche, MD Follow up.   Specialty: Cardiology Why: [place] location - [facility] follow-up has been arranged for you on Friday May 20, 2021 at 4:00 PM (Arrive by 3:45 PM). Contact information: [place] [address] [phone]         Raeanne Gathers, MD Follow up.   Specialty: Family Medicine Contact information: [place] [address] [phone]         Jenean Lindau, MD .   Specialty: Cardiology Contact information: [place] [address] [phone]                   The results of significant diagnostics from this hospitalization (including imaging, microbiology, ancillary and laboratory) are listed below for reference.    Significant Diagnostic Studies: DG Chest 2 View  Result Date: 05/03/2021 CLINICAL DATA:  Shortness of breath EXAM: CHEST - 2 VIEW COMPARISON:  August 17, 2016 FINDINGS: There is mild left base atelectasis. The lungs elsewhere are clear. Heart is borderline enlarged with pulmonary vascularity normal. No adenopathy. No bone lesions. IMPRESSION: Left base atelectasis. No frank edema or consolidation. Heart borderline enlarged. Electronically Signed   By: Lowella Grip III M.D.   On: 05/03/2021 21:09   CARDIAC CATHETERIZATION  Result Date: 05/05/2021  Prox RCA to Mid RCA lesion is 5% stenosed.  Prox RCA lesion is 25% stenosed.  Dist RCA lesion is 70% stenosed.  Mid LM to Dist LM lesion is 25% stenosed.  Ost Cx lesion is 20% stenosed.  Prox Cx to Mid Cx lesion is 30% stenosed.  Prox LAD  to Mid LAD lesion is 20% stenosed.  1st Diag lesion is 30% stenosed.  Mid LAD lesion is 50% stenosed.  There is evidence for moderate coronary calcification involving predominantly the LAD and RCA.  The left main has smooth 25% mid stenosis; there is calcification in the proximal LAD with narrowing of 25% followed by 40-50% mid stenosis and 30% diagonal stenosis; the circumflex ostium has 20% stenosis with 30% mid stenosis; the tandem RCA stents are widely patent with minimal intimal hyperplasia. There is 30% stenosis proximal to the stent.  There is 65 to 70% stenosis in the distal RCA beyond the acute margin before the PDA takeoff. LVEDP 19 mm Hg.  RECOMMENDATION: Hydration post cath with creatinine 1.92.  Recommend initiation of antianginal  medical therapy.  Aggressive  lipid-lowering therapy; recommend titration of rosuvastatin to 40 mg.   ECHOCARDIOGRAM COMPLETE  Result Date: 05/06/2021    ECHOCARDIOGRAM REPORT   Patient  Name:   [name] [name] Date of Exam: 05/06/2021 Medical Rec #:  [id number]          Height:       73.0 in Accession #:    [id number]         Weight:       229.9 lb Date of Birth:  December 14, 1950          BSA:          2.283 m Patient Age:    70 years           BP:           112/84 mmHg Patient Gender: M                  HR:           76 bpm. Exam Location:  Inpatient Procedure: 2D Echo, Cardiac Doppler, Color Doppler and Strain Analysis Indications:    I50.23 Acute on chronic systolic (congestive) heart failure  History:        Patient has prior history of Echocardiogram examinations, most                 recent 08/07/2020. Cardiomyopathy, CAD and Previous Myocardial                 Infarction, Arrythmias:Atrial Fibrillation; Risk                 Factors:Hypertension, Diabetes, Dyslipidemia and Current Smoker.  Sonographer:    Jonelle Sidle Dance Referring Phys: [place]  1. Left ventricular ejection fraction, by estimation, is 20 to 25%. The left ventricle has severely decreased function. The left ventricle demonstrates global hypokinesis. There is severe concentric left ventricular hypertrophy. Left ventricular diastolic parameters are consistent with Grade III diastolic dysfunction (restrictive).  2. Right ventricular systolic function is mildly reduced. The right ventricular size is normal. There is normal pulmonary artery systolic pressure.  3. Left atrial size was mildly dilated.  4. Right atrial size was mild to moderately dilated.  5. The mitral valve is grossly normal. Mild mitral valve regurgitation.  6. The aortic valve is grossly normal. There is mild calcification of the aortic valve. Aortic valve regurgitation is not visualized. No aortic stenosis is present. FINDINGS  Left Ventricle: Left ventricular ejection fraction, by estimation, is 20 to 25%. The left ventricle has severely decreased function. The left ventricle demonstrates global hypokinesis. The left ventricular internal cavity  size was small. There is severe  concentric left ventricular hypertrophy. Left ventricular diastolic parameters are  consistent with Grade III diastolic dysfunction (restrictive). Right Ventricle: The right ventricular size is normal. Right vetricular wall thickness was not well visualized. Right ventricular systolic function is mildly reduced. There is normal pulmonary artery systolic pressure. The tricuspid regurgitant velocity is 2.57 m/s, and with an assumed right atrial pressure of 8 mmHg, the estimated right ventricular systolic pressure is 27.0 mmHg. Left Atrium: Left atrial size was mildly dilated. Right Atrium: Right atrial size was mild to moderately dilated. Pericardium: There is no evidence of pericardial effusion. Mitral Valve: The mitral valve is grossly normal. Mild mitral valve regurgitation. Tricuspid Valve: The tricuspid valve is normal in structure. Tricuspid valve regurgitation is mild. Aortic Valve: The aortic valve is grossly normal. There is mild calcification of the aortic valve. Aortic valve regurgitation is not visualized. No aortic stenosis is present. Pulmonic Valve: The pulmonic valve was normal in structure. Pulmonic valve regurgitation is not visualized. Aorta: The aortic root and ascending aorta are structurally normal, with no evidence of dilitation. IAS/Shunts: The atrial septum is grossly normal.  LEFT VENTRICLE PLAX 2D LVIDd:         5.00 cm  Diastology LVIDs:         4.40 cm  LV e' medial:    2.37 cm/s LV PW:         2.00 cm  LV E/e' medial:  22.7 LV IVS:        2.10 cm  LV e' lateral:   4.62 cm/s LVOT diam:     1.90 cm  LV E/e' lateral: 11.7 LV SV:         30 LV SV Index:   13 LVOT Area:     2.84 cm  RIGHT VENTRICLE            IVC RV Basal diam:  4.20 cm    IVC diam: 2.10 cm RV Mid diam:    2.90 cm RV S prime:     3.37 cm/s TAPSE (M-mode): 1.8 cm LEFT ATRIUM              Index       RIGHT ATRIUM           Index LA diam:        5.10 cm  2.23 cm/m  RA Area:     25.70 cm LA Vol  (A2C):   104.0 ml 45.55 ml/m RA Volume:   95.20 ml  41.70 ml/m LA Vol (A4C):   77.5 ml  33.95 ml/m LA Biplane Vol: 90.3 ml  39.55 ml/m  AORTIC VALVE LVOT Vmax:   68.85 cm/s LVOT Vmean:  41.000 cm/s LVOT VTI:    0.105 m  AORTA Ao Root diam: 3.50 cm Ao Asc diam:  3.60 cm MITRAL VALVE               TRICUSPID VALVE MV Area (PHT): 2.37 cm    TR Peak grad:   26.4 mmHg MV Decel Time: 320 msec    TR Vmax:        257.00 cm/s MV E velocity: 53.90 cm/s MV Nimrit Kehres velocity: 22.60 cm/s  SHUNTS MV E/Edlin Ford ratio:  2.38        Systemic VTI:  0.10 m                            Systemic Diam: 1.90 cm Mertie Moores MD Electronically signed by Mertie Moores MD Signature Date/Time: 05/06/2021/11:31:56 AM    Final  VAS [place] LOWER EXTREMITY VENOUS (DVT)  Result Date: 05/04/2021  Lower Venous DVT Study Patient Name:  GARLEN REINIG  Date of Exam:   05/04/2021 Medical Rec #: [id number]           Accession #:    [id number] Date of Birth: 1950/12/26           Patient Gender: M Patient Age:   70Y Exam Location:  [facility] Procedure:      VAS [place] LOWER EXTREMITY VENOUS (DVT) Referring Phys: [id number] Mylissa Lambe CALDWELL POWELL JR --------------------------------------------------------------------------------  Indications: Edema.  Comparison Study: No prior studies. Performing Technologist: Darlin Coco RDMS,RVT  Examination Guidelines: Deanndra Kirley complete evaluation includes B-mode imaging, spectral Doppler, color Doppler, and power Doppler as needed of all accessible portions of each vessel. Bilateral testing is considered an integral part of [name] complete examination. Limited examinations for reoccurring indications may be performed as noted. The reflux portion of the exam is performed with the patient in reverse Trendelenburg.  +---------+---------------+---------+-----------+----------+--------------+ RIGHT    CompressibilityPhasicitySpontaneityPropertiesThrombus Aging +---------+---------------+---------+-----------+----------+--------------+  CFV      Full           No       Yes                                 +---------+---------------+---------+-----------+----------+--------------+ SFJ      Full                                                        +---------+---------------+---------+-----------+----------+--------------+ FV Prox  Full                                                        +---------+---------------+---------+-----------+----------+--------------+ FV Mid   Full                                                        +---------+---------------+---------+-----------+----------+--------------+ FV DistalFull                                                        +---------+---------------+---------+-----------+----------+--------------+ PFV      Full                                                        +---------+---------------+---------+-----------+----------+--------------+ POP      Full           No       Yes                                 +---------+---------------+---------+-----------+----------+--------------+  PTV      Full                                                        +---------+---------------+---------+-----------+----------+--------------+ PERO     Full                                                        +---------+---------------+---------+-----------+----------+--------------+   +---------+---------------+---------+-----------+----------+--------------+ LEFT     CompressibilityPhasicitySpontaneityPropertiesThrombus Aging +---------+---------------+---------+-----------+----------+--------------+ CFV      Full           No       Yes                                 +---------+---------------+---------+-----------+----------+--------------+ SFJ      Full                                                        +---------+---------------+---------+-----------+----------+--------------+ FV Prox  Full                                                         +---------+---------------+---------+-----------+----------+--------------+ FV Mid   Full                                                        +---------+---------------+---------+-----------+----------+--------------+ FV DistalFull                                                        +---------+---------------+---------+-----------+----------+--------------+ PFV      Full                                                        +---------+---------------+---------+-----------+----------+--------------+ POP      Full           No       Yes                                 +---------+---------------+---------+-----------+----------+--------------+ PTV      Full                                                        +---------+---------------+---------+-----------+----------+--------------+  PERO     Full                                                        +---------+---------------+---------+-----------+----------+--------------+     Summary: RIGHT: - There is no evidence of deep vein thrombosis in the lower extremity.  - No cystic structure found in the popliteal fossa. - Pulsatile waveforms suggestive of elevated right heart pressures.  LEFT: - There is no evidence of deep vein thrombosis in the lower extremity.  - No cystic structure found in the popliteal fossa. - Pulsatile waveforms suggestive of elevated right heart pressures.  *See table(s) above for measurements and observations. Electronically signed by Harold Barban MD on 05/04/2021 at 7:49:04 PM.    Final     Microbiology: Recent Results (from the past 240 hour(s))  Resp Panel by RT-PCR (Flu Sian Rockers&B, Covid) Nasopharyngeal Swab     Status: None   Collection Time: 05/04/21  1:36 AM   Specimen: Nasopharyngeal Swab; Nasopharyngeal(NP) swabs in vial transport medium  Result Value Ref Range Status   SARS Coronavirus 2 by RT PCR NEGATIVE NEGATIVE Final    Comment: (NOTE) SARS-CoV-2  target nucleic acids are NOT DETECTED.  The SARS-CoV-2 RNA is generally detectable in upper respiratory specimens during the acute phase of infection. The lowest concentration of SARS-CoV-2 viral copies this assay can detect is 138 copies/mL. Keenya Matera negative result does not preclude SARS-Cov-2 infection and should not be used as the sole basis for treatment or other patient management decisions. Letonya Mangels negative result may occur with  improper specimen collection/handling, submission of specimen other than nasopharyngeal swab, presence of viral mutation(s) within the areas targeted by this assay, and inadequate number of viral copies(<138 copies/mL). Talasia Saulter negative result must be combined with clinical observations, patient history, and epidemiological information. The expected result is Negative.  Fact Sheet for Patients:  EntrepreneurPulse.com.au  Fact Sheet for Healthcare Providers:  IncredibleEmployment.be  This test is no t yet approved or cleared by the [place] FDA and  has been authorized for detection and/or diagnosis of SARS-CoV-2 by FDA under an Emergency Use Authorization (EUA). This EUA will remain  in effect (meaning this test can be used) for the duration of the COVID-19 declaration under Section 564(b)(1) of the Act, 21 U.S.C.section 360bbb-3(b)(1), unless the authorization is terminated  or revoked sooner.       Influenza Darlyn Repsher by PCR NEGATIVE NEGATIVE Final   Influenza B by PCR NEGATIVE NEGATIVE Final    Comment: (NOTE) The Xpert Xpress SARS-CoV-2/FLU/RSV plus assay is intended as an aid in the diagnosis of influenza from Nasopharyngeal swab specimens and should not be used as Rudolph Daoust sole basis for treatment. Nasal washings and aspirates are unacceptable for Xpert Xpress SARS-CoV-2/FLU/RSV testing.  Fact Sheet for Patients: EntrepreneurPulse.com.au  Fact Sheet for Healthcare  Providers: IncredibleEmployment.be  This test is not yet approved or cleared by the [place] FDA and has been authorized for detection and/or diagnosis of SARS-CoV-2 by FDA under an Emergency Use Authorization (EUA). This EUA will remain in effect (meaning this test can be used) for the duration of the COVID-19 declaration under Section 564(b)(1) of the Act, 21 U.S.C. section 360bbb-3(b)(1), unless the authorization is terminated or revoked.  Performed at [facility], [place] [address], [place], [place] [address]   Surgical pcr screen  Status: None   Collection Time: 05/05/21  5:51 AM   Specimen: Nasal Mucosa; Nasal Swab  Result Value Ref Range Status   MRSA, PCR NEGATIVE NEGATIVE Final   Staphylococcus aureus NEGATIVE NEGATIVE Final    Comment: (NOTE) The Xpert SA Assay (FDA approved for NASAL specimens in patients 60 years of age and older), is one component of Ayda Tancredi comprehensive surveillance program. It is not intended to diagnose infection nor to guide or monitor treatment. Performed at [facility], [place] [address] [place], [place] [address]      Labs: Basic Metabolic Panel: Recent Labs  Lab 05/03/21 2104 05/04/21 1155 05/04/21 1419 05/04/21 2131 05/05/21 0500 05/06/21 0450  NA 140 140 141  --  141 141  K 3.0* 2.9* 3.3*  --  3.1* 3.7  CL 104 102 99  --  101 104  CO2 26 29 33*  --  32 28  GLUCOSE 148* 149* 105*  --  127* 117*  BUN 23 21 22   --  26* 31*  CREATININE 1.80* 1.53* 1.85*  --  1.92* 1.86*  CALCIUM 9.2 9.1 9.1  --  8.9 9.0  MG  --  1.8  --   --  1.9 2.2  PHOS  --   --   --  3.3 3.4  --    Liver Function Tests: Recent Labs  Lab 05/04/21 1155 05/05/21 0500 05/06/21 0450  AST 23 23 24   ALT 13 14 14   ALKPHOS 84 90 94  BILITOT 2.5* 1.7* 1.7*  PROT 6.6 6.4* 6.6  ALBUMIN 3.6 3.6 3.8   No results for input(s): LIPASE, AMYLASE in the last 168 hours. No results for input(s): AMMONIA in the  last 168 hours. CBC: Recent Labs  Lab 05/03/21 2104 05/04/21 0432 05/05/21 0500 05/06/21 0450  WBC 5.8 5.1 5.0 5.1  NEUTROABS 4.1 3.5 3.4 3.4  HGB 15.0 14.3 13.7 14.2  HCT 48.6 45.3 43.9 46.9  MCV 84.7 83.7 85.4 86.5  PLT 122* 96* 104* 108*   Cardiac Enzymes: No results for input(s): CKTOTAL, CKMB, CKMBINDEX, TROPONINI in the last 168 hours. BNP: BNP (last 3 results) Recent Labs    05/03/21 2105  BNP 646.8*    ProBNP (last 3 results) No results for input(s): PROBNP in the last 8760 hours.  CBG: Recent Labs  Lab 05/05/21 1125 05/05/21 1844 05/05/21 2011 05/06/21 0718 05/06/21 1210  GLUCAP 90 139* 161* 91 112*       Signed:  Fayrene Helper MD.  [facility] 05/06/2021, 1:34 PM

## 2021-05-06 NOTE — Progress Notes (Signed)
ANTICOAGULATION CONSULT NOTE - Initial Consult  Pharmacy Consult for Eliquis  Indication: atrial fibrillation  Allergies  Allergen Reactions   Allopurinol Other (See Comments)    Felt terrible   Aripiprazole Other (See Comments)    Felt terrible   Bupropion Other (See Comments)    Felt bad   Buspirone Other (See Comments)    Felt bad   Desvenlafaxine Other (See Comments)    Made him hyper   Duloxetine Other (See Comments)    Knocked him out   Escitalopram Other (See Comments)    Felt like crap   Fish Oil     unknown   Fluticasone Furoate Other (See Comments)    Unknown    Hydroxyzine Hcl Other (See Comments)    Knocked him out   Nortriptyline Other (See Comments)    Ineffective   Paroxetine Hcl Other (See Comments)    unknown   Ramipril Cough   Sertraline Other (See Comments)    "felt plugged in"   Venlafaxine Other (See Comments)    Felt bad   Vilazodone Other (See Comments)    "felt weird"    Patient Measurements: Height: 6\' 1"  (185.4 cm) Weight: 105.9 kg (233 lb 7.5 oz) IBW/kg (Calculated) : 79.9 Heparin Dosing Weight:   Vital Signs: Temp: 97.5 F (36.4 C) (06/17 0505) Temp Source: Oral (06/17 0505) BP: 112/84 (06/17 0505) Pulse Rate: 67 (06/17 0505)  Labs: Recent Labs    05/04/21 0432 05/04/21 1155 05/04/21 1419 05/05/21 0500 05/06/21 0450  HGB 14.3  --   --  13.7 14.2  HCT 45.3  --   --  43.9 46.9  PLT 96*  --   --  104* 108*  LABPROT  --   --   --  16.2*  --   INR  --   --   --  1.3*  --   CREATININE  --  1.53* 1.85* 1.92* 1.86*  TROPONINIHS 176* 252* 236* 221*  --     Estimated Creatinine Clearance: 48.5 mL/min (A) (by C-G formula based on SCr of 1.86 mg/dL (H)).   Medical History: Past Medical History:  Diagnosis Date   Arthritis of left knee 12/02/2020   Bilateral leg edema 05/04/2017   CAD (coronary artery disease)    nonST elevated MI in Oct 2008 treated w/2 drug -eluting stents to the RCA and  mid LAD, EF 55%   CAD (coronary  artery disease), native coronary artery 11/12/2018   Formatting of this note might be different from the original. S/p MI - Dr. 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 Hypertension  10/1 IMO update   Gout, unspecified 10/14/2007   Overview:  Gout  10/1 IMO update  Formatting of this note might be different from the original. Gout  10/1 IMO update   History of colonic polyps 11/12/2018   Overview:  rpt colonoscopy 5/17; Dr. Haig Prophet of this note might be different from the original. rpt colonoscopy 5/17; Dr. Erlene Quan   History of kidney stones    History of nephrolithiasis 11/12/2018  History of smoking 11/12/2018   Overview:  quit 2000  Formatting of this note might be different from the original. quit 2000   HTN (hypertension)    Hyperlipidemia    HYPERLIPIDEMIA-MIXED 10/14/2007   Qualifier: Diagnosis of  By: Haroldine Laws, MD, Willette Cluster of this note might be different from the original. Hyperlipidemia   HYPERTENSION, BENIGN 06/15/2009   Inguinal hernia without mention of obstruction or gangrene, recurrent unilateral or unspecified 11/12/2018   Ischemic cardiomyopathy 08/12/2020   Medication intolerance 11/12/2018   Mixed hyperlipidemia 11/12/2018   Old myocardial infarction 11/12/2018   Paroxysmal atrial fibrillation ([place]) 07/16/2020   Pre-operative cardiovascular examination 11/12/2018   Primary osteoarthritis of both knees 03/05/2017   Considering Zilretta injections. Good improvement with corticosteroid injections  however short-lived. Mild improvement with Visco supplementation.   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 Diabetes Mellitus   Unilateral primary osteoarthritis, right knee 11/15/2018    Assessment:  70 YO M with hx of CAD, cardiomyopathy with EF 35-40%, stroke, PAF admitted with worsening heart failure and moderate coronary calcification. CHADSVASC 7. Had been on Xarelto in past but dc'd due to cost barriers. PT taking Plavix recently. Cardiology and I explained importance of taking Eliquis for Afib. PT educated today on Eliquis and given 30 day free card.  ScR 1.86. HgB 14.2. WBC wnl.   Plan:  Start Eliquis 5mg  BID for Afib. Monitor for s/sx of bleeding.   Austin Herd 05/06/2021,12:55 PM

## 2021-05-06 NOTE — Discharge Instructions (Signed)
Information on my medicine - ELIQUIS (apixaban)  This medication education was reviewed with me or my healthcare representative as part of my discharge preparation.  The pharmacist that spoke with me during my hospital stay was:  Venora Maples, Student-PharmD  Why was Eliquis prescribed for you? Eliquis was prescribed for you to reduce the risk of a blood clot forming that can cause a stroke if you have a medical condition called atrial fibrillation (a type of irregular heartbeat).  What do You need to know about Eliquis ? Take your Eliquis TWICE DAILY - one tablet in the morning and one tablet in the evening with or without food. If you have difficulty swallowing the tablet whole please discuss with your pharmacist how to take the medication safely.  Take Eliquis exactly as prescribed by your doctor and DO NOT stop taking Eliquis without talking to the doctor who prescribed the medication.  Stopping may increase your risk of developing a stroke.  Refill your prescription before you run out.  After discharge, you should have regular check-up appointments with your healthcare provider that is prescribing your Eliquis.  In the future your dose may need to be changed if your kidney function or weight changes by a significant amount or as you get older.  What do you do if you miss a dose? If you miss a dose, take it as soon as you remember on the same day and resume taking twice daily.  Do not take more than one dose of ELIQUIS at the same time to make up a missed dose.  Important Safety Information A possible side effect of Eliquis is bleeding. You should call your healthcare provider right away if you experience any of the following: Bleeding from an injury or your nose that does not stop. Unusual colored urine (red or dark brown) or unusual colored stools (red or black). Unusual bruising for unknown reasons. A serious fall or if you hit your head (even if there is no bleeding).  Some  medicines may interact with Eliquis and might increase your risk of bleeding or clotting while on Eliquis. To help avoid this, consult your healthcare provider or pharmacist prior to using any new prescription or non-prescription medications, including herbals, vitamins, non-steroidal anti-inflammatory drugs (NSAIDs) and supplements.  This website has more information on Eliquis (apixaban): http://www.eliquis.com/eliquis/home

## 2021-05-06 NOTE — Telephone Encounter (Signed)
Pt remains in the hospital at this time.

## 2021-05-09 NOTE — Telephone Encounter (Signed)
Left VM for pt to callback regarding hospital discharge.

## 2021-05-09 NOTE — Telephone Encounter (Signed)
Pt is returning call.  

## 2021-05-10 ENCOUNTER — Telehealth (HOSPITAL_COMMUNITY): Payer: Self-pay | Admitting: Surgery

## 2021-05-10 NOTE — Telephone Encounter (Signed)
I called patient to remind him of [facility] Appt and provide directions to clinic if necessary.  He is aware and planing to come to appt.

## 2021-05-11 ENCOUNTER — Encounter (HOSPITAL_COMMUNITY): Payer: Medicare Other

## 2021-05-11 ENCOUNTER — Ambulatory Visit: Payer: Medicare Other | Admitting: Cardiology

## 2021-05-11 ENCOUNTER — Ambulatory Visit (HOSPITAL_COMMUNITY)
Admission: RE | Admit: 2021-05-11 | Discharge: 2021-05-11 | Disposition: A | Discharge status: Home / Self Care | Payer: Medicare Other | Source: Ambulatory Visit | Attending: Internal Medicine | Admitting: Internal Medicine

## 2021-05-11 ENCOUNTER — Other Ambulatory Visit: Payer: Self-pay

## 2021-05-11 ENCOUNTER — Encounter (HOSPITAL_COMMUNITY): Payer: Self-pay

## 2021-05-11 VITALS — BP 116/80 | HR 84 | Wt 235.2 lb

## 2021-05-11 DIAGNOSIS — I5023 Acute on chronic systolic (congestive) heart failure: Secondary | ICD-10-CM | POA: Diagnosis not present

## 2021-05-11 DIAGNOSIS — E1122 Type 2 diabetes mellitus with diabetic chronic kidney disease: Secondary | ICD-10-CM | POA: Diagnosis not present

## 2021-05-11 DIAGNOSIS — R42 Dizziness and giddiness: Secondary | ICD-10-CM | POA: Diagnosis not present

## 2021-05-11 DIAGNOSIS — E1159 Type 2 diabetes mellitus with other circulatory complications: Secondary | ICD-10-CM | POA: Diagnosis not present

## 2021-05-11 DIAGNOSIS — D696 Thrombocytopenia, unspecified: Secondary | ICD-10-CM | POA: Diagnosis not present

## 2021-05-11 DIAGNOSIS — I5043 Acute on chronic combined systolic (congestive) and diastolic (congestive) heart failure: Secondary | ICD-10-CM

## 2021-05-11 DIAGNOSIS — Z8249 Family history of ischemic heart disease and other diseases of the circulatory system: Secondary | ICD-10-CM | POA: Insufficient documentation

## 2021-05-11 DIAGNOSIS — I130 Hypertensive heart and chronic kidney disease with heart failure and stage 1 through stage 4 chronic kidney disease, or unspecified chronic kidney disease: Secondary | ICD-10-CM | POA: Diagnosis not present

## 2021-05-11 DIAGNOSIS — I480 Paroxysmal atrial fibrillation: Secondary | ICD-10-CM | POA: Insufficient documentation

## 2021-05-11 DIAGNOSIS — Z87442 Personal history of urinary calculi: Secondary | ICD-10-CM | POA: Diagnosis not present

## 2021-05-11 DIAGNOSIS — Z87891 Personal history of nicotine dependence: Secondary | ICD-10-CM | POA: Diagnosis not present

## 2021-05-11 DIAGNOSIS — Z7901 Long term (current) use of anticoagulants: Secondary | ICD-10-CM | POA: Insufficient documentation

## 2021-05-11 DIAGNOSIS — Z955 Presence of coronary angioplasty implant and graft: Secondary | ICD-10-CM | POA: Diagnosis not present

## 2021-05-11 DIAGNOSIS — Z7902 Long term (current) use of antithrombotics/antiplatelets: Secondary | ICD-10-CM | POA: Insufficient documentation

## 2021-05-11 DIAGNOSIS — Z794 Long term (current) use of insulin: Secondary | ICD-10-CM | POA: Diagnosis not present

## 2021-05-11 DIAGNOSIS — I2510 Atherosclerotic heart disease of native coronary artery without angina pectoris: Secondary | ICD-10-CM | POA: Diagnosis not present

## 2021-05-11 DIAGNOSIS — Z833 Family history of diabetes mellitus: Secondary | ICD-10-CM | POA: Insufficient documentation

## 2021-05-11 DIAGNOSIS — Z79899 Other long term (current) drug therapy: Secondary | ICD-10-CM | POA: Insufficient documentation

## 2021-05-11 DIAGNOSIS — D751 Secondary polycythemia: Secondary | ICD-10-CM | POA: Diagnosis not present

## 2021-05-11 DIAGNOSIS — N1832 Chronic kidney disease, stage 3b: Secondary | ICD-10-CM | POA: Insufficient documentation

## 2021-05-11 LAB — BASIC METABOLIC PANEL
Anion gap: 8 (ref 5–15)
BUN: 24 mg/dL — ABNORMAL HIGH (ref 8–23)
CO2: 31 mmol/L (ref 22–32)
Calcium: 9.4 mg/dL (ref 8.9–10.3)
Chloride: 102 mmol/L (ref 98–111)
Creatinine, Ser: 1.75 mg/dL — ABNORMAL HIGH (ref 0.61–1.24)
GFR, Estimated: 42 mL/min — ABNORMAL LOW (ref >60)
Glucose, Bld: 96 mg/dL (ref 70–99)
Potassium: 3.5 mmol/L (ref 3.5–5.1)
Sodium: 141 mmol/L (ref 135–145)

## 2021-05-11 LAB — BRAIN NATRIURETIC PEPTIDE: B Natriuretic Peptide: 659.7 pg/mL — ABNORMAL HIGH (ref 0.0–100.0)

## 2021-05-11 MED ORDER — DAPAGLIFLOZIN PROPANEDIOL 10 MG PO TABS: 10.0000 mg | ORAL_TABLET | Freq: Every day | ORAL | 11 refills | Status: AC

## 2021-05-11 NOTE — Patient Instructions (Signed)
   Labs done today. We will contact you only if your labs are abnormal.  No other medication changes were made. Please continue all current medications as prescribed.  Your physician recommends that you schedule a follow-up appointment in:    If you have any questions or concerns before your next appointment please send [place] a message through [place] or call our office at [phone].    TO LEAVE A MESSAGE FOR THE NURSE SELECT OPTION 2, PLEASE LEAVE A MESSAGE INCLUDING: YOUR NAME DATE OF BIRTH CALL BACK NUMBER REASON FOR CALL**this is important as we prioritize the call backs  YOU WILL RECEIVE A CALL BACK THE SAME DAY AS LONG AS YOU CALL BEFORE 4:00 PM   Do the following things EVERYDAY: Weigh yourself in the morning before breakfast. Write it down and keep it in a log. Take your medicines as prescribed Eat low salt foods--Limit salt (sodium) to 2000 mg per day.  Stay as active as you can everyday Limit all fluids for the day to less than 2 liters   At the [facility], you and your health needs are our priority. As part of our continuing mission to provide you with exceptional heart care, we have created designated Provider Care Teams. These Care Teams include your primary Cardiologist (physician) and Advanced Practice Providers (APPs- Physician Assistants and Nurse Practitioners) who all work together to provide you with the care you need, when you need it.   You may see any of the following providers on your designated Care Team at your next follow up: Dr Glori Bickers Dr Haynes Kerns, NP Lyda Jester, [place] Audry Riles, PharmD   Please be sure to bring in all your medications bottles to every appointment.

## 2021-05-11 NOTE — Progress Notes (Signed)
ReDS Vest / Clip - 05/11/21 1400       ReDS Vest / Clip   Station Marker D    Ruler Value 35    ReDS Value Range High volume overload    ReDS Actual Value 42    Anatomical Comments sitting

## 2021-05-11 NOTE — Progress Notes (Signed)
Heart and Vascular Care Navigation  05/11/2021  [name] [name] [birth year] [id number]  Reason for Referral: Patient seen in HF TOC.   Engaged with patient face to face for initial visit for Heart and Vascular Care Coordination.                                                                                                   Assessment:   Patient is a 70yo male who lives alone in a single family home. Patient reports his wife died in 2017-12-30 and has been struggling with grief and depression. Patient has no children and his wife had a son and daughter. He reports he would like a better relationship with his step son and daughter as they don't call or visit very often. He admits to feeling stressed and depressed due to loneliness and grief over his wife. Patient had a recent knee replacement and feels like if he could get involved with some activities it would help all around. Patient reports he manages with his co pays for his medications until the recent addition of Eliquis which is $140 monthly.  Patient does not appear to meet the out of pocket expense for the Patient Assistance yet but it was discussed and he will obtain his pharmacy out of pocket expense for next clinic visit.                             HRT/VAS Care Coordination     Patients Home Cardiology Office --  [facility]   Outpatient Care Team Social Worker   Social Worker Name: Raquel Sarna, Marlinda Mike [phone]   Living arrangements for the past 2 months Single Family Home   Patient Current Insurance Coverage Managed Medicare   Patient Has Concern With [place] No   Does Patient Have Prescription Coverage? Yes   Home Assistive Devices/Equipment Scales; Walker (specify type); Cane (specify quad or straight); Built-in shower seat   DME Agency NA   [place] [facility]now [facility])       Social History:                                                                             SDOH Screenings    Alcohol Screen: Not on file  Depression (PHQ2-9): Medium Risk   PHQ-2 Score: 12  Financial Resource Strain: Low Risk    Difficulty of Paying Living Expenses: Not hard at all  Food Insecurity: No Food Insecurity   Worried About Charity fundraiser in the Last Year: Never true   Arboriculturist in the Last Year: Never true  Housing: Low Risk    Last Housing Risk Score: 0  Physical Activity: Insufficiently Active   Days  of Exercise per Week: 3 days   Minutes of Exercise per Session: 10 min  Social Connections: Not on file  Stress: Stress Concern Present   Feeling of Stress : Very much  Tobacco Use: Medium Risk   Smoking Tobacco Use: Former   Smokeless Tobacco Use: Former  Transport planner Needs: No Data processing manager (Medical): No   Lack of Transportation (Non-Medical): No    SDOH Interventions: Financial Resources:    Will monitor for needs  Food Insecurity:   N/a  Housing Insecurity:   N/a  Transportation:    N/a   Health Promotion Interventions:     Physical Inactivity Re[facility]  Smoking Cessation N/a  Dietary Concerns N/a  Health Coaching N/a   Other Care Navigation Interventions:     Inpatient/Outpatient Substance Abuse Counseling/Rehab Options N/a  Provided Pharmacy assistance resources  Discussed Eliquis PA although has not yet met OOP.  Patient expressed Mental Health concerns Yes, Referred to:  [facility] for group activity and HF Men's Support Group  Patient Referred to: N/a   Follow-up plan:  Patient will reach out to his pharmacy and obtain OOP expenses for the calendar year to determine eligibility for Eliquis PA. Patient will explore resources and programming at [facility] and attend the HF Men's Support Group. Patient appears to be in better spirits and feeling motivated for improved health. Patient grateful for the support and assistance form the [facility]  team. CSW continues to follow as needed. Raquel Sarna, [place], [place]

## 2021-05-11 NOTE — Patient Instructions (Signed)
Labs done today. We will contact you only if your labs are abnormal.  INCREASE Lasix to 40mg  (1 tablet) by mouth 2 times daily for 4 days THEN DECREASE back down to 40mg ( 1 tablet) by mouth daily.  INCREASE Potassium to 57meq (1 tablet) by mouth 2 times daily for 4 days THEN DECREASE back down to 52meq(1 tablet) by mouth daily.  START taking Farxiga daily at bedtime.   No other medication changes were made. Please continue all current medications as prescribed.  Your physician recommends that you schedule a follow-up appointment soon with our [facility] here in our office.   If you have any questions or concerns before your next appointment please send [place] a message through [place] or call our office at [phone].    TO LEAVE A MESSAGE FOR THE NURSE SELECT OPTION 2, PLEASE LEAVE A MESSAGE INCLUDING: YOUR NAME DATE OF BIRTH CALL BACK NUMBER REASON FOR CALL**this is important as we prioritize the call backs  YOU WILL RECEIVE A CALL BACK THE SAME DAY AS LONG AS YOU CALL BEFORE 4:00 PM   Do the following things EVERYDAY: Weigh yourself in the morning before breakfast. Write it down and keep it in a log. Take your medicines as prescribed Eat low salt foods--Limit salt (sodium) to 2000 mg per day.  Stay as active as you can everyday Limit all fluids for the day to less than 2 liters   At the [facility], you and your health needs are our priority. As part of our continuing mission to provide you with exceptional heart care, we have created designated Provider Care Teams. These Care Teams include your primary Cardiologist (physician) and Advanced Practice Providers (APPs- Physician Assistants and Nurse Practitioners) who all work together to provide you with the care you need, when you need it.   You may see any of the following providers on your designated Care Team at your next follow up: Dr Glori Bickers Dr Haynes Kerns, NP Lyda Jester,  [place] Audry Riles, PharmD   Please be sure to bring in all your medications bottles to every appointment.

## 2021-05-11 NOTE — Progress Notes (Addendum)
[facility]  PCP: Raeanne Gathers Primary Cardiologist: Jyl Heinz  HPI:  [name] [name] is a 70 y.o.  male  with a PMH significant for 3 vessel disease CAD, NSTEMI 2008 with PCI of RCA with 2 lapping DES, HTN, HLD, DM   non-ST-elevation myocardial infarction, October 2008, treated with two drug-eluting stents to the RCA and a stent to the mid LAD.  EF was 55%.  Other disease included 40% LM mid and distal lesions.  LAD with 70% lesion in midsection after 1st diag, and 90% stenosis within that section as well.  LCX small but patent.  Mid AV groove with 50% tubular lesion.  RCA totally occluded prox and calcification in mid section.    He was admitted in March 2012 for dizziness and staggering while mowing.  Also had some chest tightness. HR on admit was in low 40s and coreg decreased. Ruled out for MI with serial cardiac markers. Had f/u ETT/Myoview on 02/20/11, Walked 8:01. Stopped due to dyspnea. No CP.  BP 164/93 at baseline.went up to 220/84 in stage I. ECG normal. Images with mild reversible defect at the base of the infero-septal wall.  Last nuc study 07/2020 with decreased EF to < 30%, findings of prior MI, no ischemia.  Echo was done and EF 35-40% (  He has had PAF on xarelto and is post stroke though he tells me his xarelto was switched to plavix..  Followed by hematology in [place] with erythrocytosis and thrombocytopenia. Gets phlebotomy.     Pt presented to ER 05/03/21 for SOB/DOE for about a week along with generalized weakness.  Had PRN lasix available but not taking it.  Workup sig for BNP 646  HS troponin 176.  EKG with RBBB and LPFB, rate 85, variable atrial pacemakers, variable degrees of AV block.  He was started on IV lasix.  He only received 3 doses and wanted to leave, he remained significantly volume overloaded.    Since hospitalization, still having some fatigue.  Not very active due to left knee issues.  Is using his  stationary bike works on it for about 20 minutes, has some mild shortness of breath with this exercise. Working on getting more active.  Feels very deconditioned.  Having some mild dizziness and lightheadedness.  Says he cannot tolerate farxiga says he felt tired after taking it.  He reports good urine output with the lasix, watching his fluid intake.  Tells me he is very sensitive to medications has 16 allergies listed.      ROS: All systems negative except as listed in HPI, PMH and Problem List.  SH:  Social History   Socioeconomic History   Marital status: Widowed    Spouse name: Not on file   Number of children: Not on file   Years of education: Not on file   Highest education level: Not on file  Occupational History   Not on file  Tobacco Use   Smoking status: Former    Packs/day: 0.50    Years: 10.00    Pack years: 5.00    Types: Cigarettes   Smokeless tobacco: Former    Types: Snuff, Chew    Quit date: 11/20/1968   Tobacco comments:    in his 20's  Vaping Use   Vaping Use: Never used  Substance and Sexual Activity   Alcohol use: No   Drug use: No   Sexual activity: Yes  Other Topics Concern   Not on  file  Social History Narrative   Not on file   Social Determinants of Health   Financial Resource Strain: Low Risk    Difficulty of Paying Living Expenses: Not hard at all  Food Insecurity: No Food Insecurity   Worried About Charity fundraiser in the Last Year: Never true   Arboriculturist in the Last Year: Never true  Transportation Needs: No Transportation Needs   Lack of Transportation (Medical): No   Lack of Transportation (Non-Medical): No  Physical Activity: Insufficiently Active   Days of Exercise per Week: 3 days   Minutes of Exercise per Session: 10 min  Stress: Stress Concern Present   Feeling of Stress : Very much  Social Connections: Not on file  Intimate Partner Violence: Not on file    FH:  Family History  Problem Relation Age of Onset    Hypertension Mother    Cancer Mother    Heart attack Father    Hypertension Father    Diabetes Father    Hypertension Brother    Irregular heart beat Brother     Past Medical History:  Diagnosis Date   Arthritis of left knee 12/02/2020   Bilateral leg edema 05/04/2017   CAD (coronary artery disease)    nonST elevated MI in Oct 2008 treated w/2 drug -eluting stents to the RCA and  mid LAD, EF 55%   CAD (coronary artery disease), native coronary artery 11/12/2018   Formatting of this note might be different from the original. S/p MI - Dr. 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 Hypertension  10/1 IMO update   Gout, unspecified 10/14/2007   Overview:  Gout  10/1 IMO update  Formatting of this note might be different from the original. Gout  10/1 IMO update   History of colonic polyps 11/12/2018   Overview:  rpt colonoscopy 5/17; Dr. Haig Prophet of this note might be different from the original. rpt colonoscopy 5/17; Dr. Erlene Quan   History of kidney stones    History of nephrolithiasis 11/12/2018   History of smoking 11/12/2018   Overview:  quit 2000  Formatting of this note might be different from the original. quit 2000   HTN (hypertension)    Hyperlipidemia    HYPERLIPIDEMIA-MIXED 10/14/2007   Qualifier: Diagnosis of  By: Haroldine Laws, MD, Willette Cluster of this note might be different from the original. Hyperlipidemia   HYPERTENSION, BENIGN 06/15/2009   Inguinal hernia without mention of obstruction or gangrene, recurrent  unilateral or unspecified 11/12/2018   Ischemic cardiomyopathy 08/12/2020   Medication intolerance 11/12/2018   Mixed hyperlipidemia 11/12/2018   Old myocardial infarction 11/12/2018   Paroxysmal atrial fibrillation ([place]) 07/16/2020   Pre-operative cardiovascular examination 11/12/2018   Primary osteoarthritis of both knees 03/05/2017   Considering Zilretta injections. Good improvement with corticosteroid injections however short-lived. Mild improvement with Visco supplementation.   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 Diabetes Mellitus   Unilateral primary osteoarthritis, right knee 11/15/2018    Current Outpatient Medications  Medication Sig Dispense Refill   albuterol (VENTOLIN HFA) 108 (90 Base) MCG/ACT inhaler Inhale 2 puffs into the lungs every 6 (six) hours as needed for shortness of breath.     ALPRAZolam (XANAX) 0.25 MG tablet Take 0.25 mg by mouth at bedtime as needed for anxiety.      apixaban (ELIQUIS) 5 MG TABS tablet Take 1 tablet (5 mg total) by mouth 2 (two) times daily. 60 tablet 0   CIALIS 20 MG tablet Take 20 mg by mouth daily as needed for erectile dysfunction.      colchicine 0.6 MG tablet Take 0.6 mg by mouth daily as needed. Gout flare up     cyanocobalamin (,VITAMIN B-12,) 1000 MCG/ML injection Inject 1,000 mcg into the muscle every 30 (thirty) days.      dapagliflozin propanediol (FARXIGA) 10 MG TABS tablet Take 1 tablet (10 mg total) by mouth daily. 30 tablet 0   febuxostat (ULORIC) 40 MG tablet Take 40 mg by mouth daily.     fluticasone (FLONASE) 50 MCG/ACT nasal spray Place 1 spray into both nostrils daily as needed for allergies or rhinitis.      furosemide (LASIX) 40 MG tablet Take 1 tablet (40 mg total) by mouth daily. 30 tablet 0   LANTUS SOLOSTAR 100 UNIT/ML Solostar Pen Inject 21 Units into the skin at bedtime.     levalbuterol (XOPENEX HFA) 45 MCG/ACT inhaler Inhale 1 puff into the lungs every 4 (four) hours as needed for wheezing.     potassium chloride SA (KLOR-CON) 20 MEQ tablet Take 1 tablet (20 mEq total) by mouth daily. 30 tablet 0   rosuvastatin (CRESTOR) 40 MG tablet Take 1 tablet (40 mg total) by mouth at bedtime. 30 tablet 0   traMADol (ULTRAM) 50 MG tablet Take 25 mg by mouth every 4 (four) hours as needed for pain or severe pain.     No current facility-administered medications for this encounter.    Vitals:   05/11/21 1314  BP: 116/80  Pulse: 84  SpO2: 98%  Weight: 106.7 kg (235 lb 3.2 oz)    PHYSICAL EXAM: Cardiac: JVD 12, + HJR, normal rate and rhythm, clear s1 and s2, no murmurs, rubs or gallops, 3+ LE edema bilaterally Pulmonary: Bibasilar Rales, not in distress Abdominal: non distended abdomen, soft and nontender Psych: Alert, conversant, in good spirits   ASSESSMENT & PLAN:  Acute on chronic systolic CHF/cardiomyopathy -October 2008, treated with two drug-eluting stents to the RCA and a stent to the mid LAD.  EF was 55%.  Other disease included 40% LM mid and distal lesions.  LAD with 70% lesion in midsection after 1st diag, and 90% stenosis within that section as well.  LCX small but patent.  Mid AV groove with 50% tubular lesion.  RCA totally occluded prox and calcification in mid section.  -presurgical echo 07/2020 demonstrated new low EF 35-40%, nuclear stress test same time showed small inferior basal wall infarct but no ischemia (high risk due to EF 21%) -04/2021 LHC showed moderate coronary artery disease but no significant obstruction requiring PCI, LVEDP 30mHg -04/2021 Echo EF 20-25%, Severe LV hypertrophy, G3DD -Extensive chart review never had much trouble with HTN, some whiteocoat HTN  reported occasionally but not every visit over the years.   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 -Will start workup for AL amyloid, based on results proceed with PYP scan/Cardiac MRI vs bone marrow biopsy -NYHA class II-III symptoms, volume overloaded on exam, REDS 42% -Increase lasix to 40 BID and double Kdur for 4 days then can go back to 40 lasix daily -start taking farxiga QHS, if still not tolerating may need to try 27m   CAD  - continue rosuvastatin, eliquis  - 04/2021 LHC showed moderate coronary artery disease but no significant obstruction requiring PCI   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 SJorje Guild receives periodic phlebotomy  T2DM: -continue farxiga    Follow up with AHF per Dr. SMarlou Porchrequest, also has seen Dr. BHaroldine Lawsin the past, continue amyoid workup, reassess volume status, titrate GDMT as we are able

## 2021-05-12 ENCOUNTER — Ambulatory Visit: Payer: Medicare Other | Admitting: Physician Assistant

## 2021-05-12 LAB — KAPPA/LAMBDA LIGHT CHAINS
Kappa free light chain: 37.5 mg/L — ABNORMAL HIGH (ref 3.3–19.4)
Kappa, lambda light chain ratio: 1.38 (ref 0.26–1.65)
Lambda free light chains: 27.2 mg/L — ABNORMAL HIGH (ref 5.7–26.3)

## 2021-05-12 NOTE — Telephone Encounter (Signed)
Pt has no questions as he was seen by cardiology yesterday.

## 2021-05-13 LAB — IMMUNOFIXATION, URINE

## 2021-05-15 LAB — MULTIPLE MYELOMA PANEL, SERUM
Albumin SerPl Elph-Mcnc: 4 g/dL (ref 2.9–4.4)
Albumin/Glob SerPl: 1.4 (ref 0.7–1.7)
Alpha 1: 0.3 g/dL (ref 0.0–0.4)
Alpha2 Glob SerPl Elph-Mcnc: 0.6 g/dL (ref 0.4–1.0)
B-Globulin SerPl Elph-Mcnc: 1 g/dL (ref 0.7–1.3)
Gamma Glob SerPl Elph-Mcnc: 1.1 g/dL (ref 0.4–1.8)
Globulin, Total: 3 g/dL (ref 2.2–3.9)
IgA: 333 mg/dL (ref 61–437)
IgG (Immunoglobin G), Serum: 1080 mg/dL (ref 603–1613)
IgM (Immunoglobulin M), Srm: 82 mg/dL (ref 20–172)
Total Protein ELP: 7 g/dL (ref 6.0–8.5)

## 2021-05-18 ENCOUNTER — Ambulatory Visit: Payer: Self-pay

## 2021-05-18 ENCOUNTER — Ambulatory Visit (INDEPENDENT_AMBULATORY_CARE_PROVIDER_SITE_OTHER): Payer: Medicare Other | Admitting: Physician Assistant

## 2021-05-18 ENCOUNTER — Encounter: Payer: Self-pay | Admitting: Physician Assistant

## 2021-05-18 DIAGNOSIS — R600 Localized edema: Secondary | ICD-10-CM | POA: Diagnosis not present

## 2021-05-18 DIAGNOSIS — Z96652 Presence of left artificial knee joint: Secondary | ICD-10-CM | POA: Diagnosis not present

## 2021-05-18 NOTE — Progress Notes (Signed)
HPI: Mr. Nohr returns today status post left total knee arthroplasty 12/03/2020.  He states he is using a cane.  He states his range of motion is decreased.  He was hospitalized recently due to acute chronic systolic congestive heart failure.  He has been having swelling in both legs.  He notes he is on a diuretic.  He does not use any compress hose on either leg.  He is using a cane to ambulate.  He is on chronic anticoagulation and is now Eliquis.  Review of systems see HPI otherwise negative or noncontributory.  Physical exam: General well-developed well-nourished male no acute distress mood and affect appropriate.  Psych alert and oriented x3. Bilateral lower extremities: Left knee has full extension flexion 90 degrees.  Surgical incisions well-healed.  Calf supple nontender.  +1 pitting edema bilateral legs.  Venous stasis changes bilateral lower extremities. Negative for abnormal warmth erythema or effusion left knee.  Radiographs:Left knee AP lateral views: Knee is well located.  No acute fractures.  Total knee arthroplasty components appear well-seated.  Impression: Status post left total knee arthroplasty Bilateral lower extremity edema  Plan: Having continue work on range of motion strengthening left knee.  He will also begin wearing compression hose thigh-high prescriptions given.  He will follow-up with [place] in 3 months to check his range of motion.  Questions were encouraged and answered at length.

## 2021-05-19 ENCOUNTER — Other Ambulatory Visit: Payer: Self-pay

## 2021-05-20 ENCOUNTER — Other Ambulatory Visit: Payer: Self-pay

## 2021-05-20 ENCOUNTER — Encounter: Payer: Self-pay | Admitting: Cardiology

## 2021-05-20 ENCOUNTER — Ambulatory Visit (INDEPENDENT_AMBULATORY_CARE_PROVIDER_SITE_OTHER): Payer: Medicare Other | Admitting: Cardiology

## 2021-05-20 VITALS — BP 124/74 | HR 86 | Ht 73.0 in | Wt 226.0 lb

## 2021-05-20 DIAGNOSIS — I255 Ischemic cardiomyopathy: Secondary | ICD-10-CM | POA: Diagnosis not present

## 2021-05-20 DIAGNOSIS — N289 Disorder of kidney and ureter, unspecified: Secondary | ICD-10-CM

## 2021-05-20 DIAGNOSIS — E782 Mixed hyperlipidemia: Secondary | ICD-10-CM

## 2021-05-20 DIAGNOSIS — I10 Essential (primary) hypertension: Secondary | ICD-10-CM

## 2021-05-20 DIAGNOSIS — I480 Paroxysmal atrial fibrillation: Secondary | ICD-10-CM

## 2021-05-20 NOTE — Patient Instructions (Signed)
Medication Instructions:  No medication changes. *If you need a refill on your cardiac medications before your next appointment, please call your pharmacy*   Lab Work: Your physician recommends that you have a bmet today in the office.  If you have labs (blood work) drawn today and your tests are completely normal, you will receive your results only by: [place] (if you have MyChart) OR A paper copy in the mail If you have any lab test that is abnormal or we need to change your treatment, we will call you to review the results.   Testing/Procedures: None ordered   Follow-Up: At [facility], you and your health needs are our priority.  As part of our continuing mission to provide you with exceptional heart care, we have created designated Provider Care Teams.  These Care Teams include your primary Cardiologist (physician) and Advanced Practice Providers (APPs -  Physician Assistants and Nurse Practitioners) who all work together to provide you with the care you need, when you need it.  We recommend signing up for the patient portal called "MyChart".  Sign up information is provided on this After Visit Summary.  MyChart is used to connect with patients for Virtual Visits (Telemedicine).  Patients are able to view lab/test results, encounter notes, upcoming appointments, etc.  Non-urgent messages can be sent to your provider as well.   To learn more about what you can do with MyChart, go to [email or web address].    Your next appointment:   1 month(s)  The format for your next appointment:   In Person  Provider:   Jyl Heinz, MD   Other Instructions NA

## 2021-05-20 NOTE — Progress Notes (Signed)
Cardiology Office Note:    Date:  05/20/2021   ID:  [name] [name], DOB [birth year], MRN [id number]  PCP:  Raeanne Gathers, MD  Cardiologist:  Jenean Lindau, MD   Referring MD: Raeanne Gathers, MD    ASSESSMENT:    1. Paroxysmal atrial fibrillation (HCC)   2. Mixed hyperlipidemia   3. Ischemic cardiomyopathy   4. Essential hypertension   5. Renal insufficiency    PLAN:    In order of problems listed above:  Coronary artery disease: Secondary prevention stressed with the patient.  Importance of compliance with diet medication stressed any vocalized understanding.  I told him to be ambulatory to the best of his ability. Advanced cardiomyopathy: Ischemic: Medical management as recommended and coronary angiography report.  I wanted to consider Ranexa but is not keen on any other medications at this time.  I also discussed if electrophysiology assessment would be an option for him.  Sudden cardiac death of issues were discussed but he is developmentally adjusted and I respect his wishes.  Risks such as sudden cardiac death emphasized and he understands.  In view of the fact that he is on multiple medications he will have a Chem-7 today. Essential hypertension: Blood pressure stable and diet was emphasized. Mixed dyslipidemia: On lipid-lowering therapy and we will monitor this. Congestive heart failure education was given to the patient at length.  Diet emphasized.  He was told to weigh himself on a regular basis and he promises to do so. Patient will be seen in follow-up appointment in 1 month or earlier if the patient has any concerns    Medication Adjustments/Labs and Tests Ordered: Current medicines are reviewed at length with the patient today.  Concerns regarding medicines are outlined above.  Orders Placed This Encounter  Procedures   Basic metabolic panel   No orders of the defined types were placed in this encounter.    No chief complaint on file.    History of  Present Illness:    [name] [name] is a 70 y.o. male.  Patient has past medical history of coronary artery disease, essential hypertension, mixed dyslipidemia and advanced cardiomyopathy.  He has renal insufficiency.  He denies any problems at this time and takes care of activities of daily living.  No chest pain orthopnea or PND.  At the time of my evaluation, the patient is alert awake oriented and in no distress.  He is post coronary angiography and is here for follow-up.  He weighs himself on a regular basis.  He ambulates with a cane.  Past Medical History:  Diagnosis Date   Acute on chronic congestive heart failure (HCC)    Acute on chronic systolic CHF (congestive heart failure) ([place]) 05/04/2021   Arthritis of left knee 12/02/2020   Bilateral leg edema 05/04/2017   CAD (coronary artery disease)    nonST elevated MI in Oct 2008 treated w/2 drug -eluting stents to the RCA and  mid LAD, EF 55%   CAD (coronary artery disease), native coronary artery 11/12/2018   Formatting of this note might be different from the original. S/p MI - Dr. 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 Hypertension  10/1 IMO update   Gout, unspecified 10/14/2007   Overview:  Gout  10/1 IMO update  Formatting of this note might be different from the original. Gout  10/1 IMO update   History of colonic polyps 11/12/2018   Overview:  rpt colonoscopy 5/17; Dr. Haig Prophet of this note might be different from the original. rpt colonoscopy 5/17; Dr. Erlene Quan   History  of kidney stones    History of nephrolithiasis 11/12/2018   History of smoking 11/12/2018   Overview:  quit 2000  Formatting of this note might be different from the original. quit 2000   HTN (hypertension)    Hyperlipidemia    HYPERTENSION, BENIGN 06/15/2009   Inguinal hernia without mention of obstruction or gangrene, recurrent unilateral or unspecified 11/12/2018   Ischemic cardiomyopathy 08/12/2020   Medication intolerance 11/12/2018   Mixed hyperlipidemia 11/12/2018   Old myocardial infarction 11/12/2018   Paroxysmal atrial fibrillation ([place]) 07/16/2020   Pre-operative cardiovascular examination 11/12/2018   Primary osteoarthritis of both knees 03/05/2017   Considering Zilretta injections. Good improvement with corticosteroid injections however short-lived. Mild improvement with Visco supplementation.   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 Diabetes Mellitus   Unilateral primary osteoarthritis, right knee 11/15/2018    Past Surgical History:  Procedure Laterality Date   BILATERAL KNEE ARTHROSCOPY     CORONARY ANGIOPLASTY WITH STENT PLACEMENT     LAPAROSCOPIC INGUINAL HERNIA REPAIR     LEFT HEART CATH AND CORONARY ANGIOGRAPHY N/A 05/05/2021   Procedure: LEFT HEART CATH AND CORONARY ANGIOGRAPHY;  Surgeon: Troy Sine, MD;  Location: [facility];  Service: Cardiovascular;  Laterality: N/A;   NOSE SURGERY     TOTAL KNEE ARTHROPLASTY Right 11/15/2018   Procedure: RIGHT TOTAL KNEE ARTHROPLASTY;  Surgeon: Mcarthur Rossetti, MD;  Location: [facility];  Service: Orthopedics;  Laterality: Right;   TOTAL KNEE ARTHROPLASTY Left 12/03/2020   Procedure:  LEFT TOTAL KNEE ARTHROPLASTY;  Surgeon: Mcarthur Rossetti, MD;  Location: [facility];  Service: Orthopedics;  Laterality: Left;    Current Medications: No outpatient medications have been marked as taking for the 05/20/21 encounter (Office Visit) with Taevion Sikora, Reita Cliche, MD.     Allergies:   Allopurinol, Aripiprazole, Bupropion, Buspirone, Desvenlafaxine, Duloxetine, Escitalopram, Fish oil, Fluticasone furoate, Hydroxyzine hcl, Nortriptyline, Paroxetine hcl, Ramipril, Sertraline, Venlafaxine, and Vilazodone   Social History   Socioeconomic History   Marital status: Widowed    Spouse name: Not on file   Number of children: Not on file   Years of education: Not on file   Highest education level: Not on file  Occupational History   Not on file  Tobacco Use   Smoking status: Former    Packs/day: 0.50    Years: 10.00    Pack years: 5.00    Types: Cigarettes   Smokeless tobacco: Former    Types: Snuff, Chew    Quit date: 11/20/1968   Tobacco comments:    in his 20's  Vaping Use   Vaping Use: Never used  Substance and Sexual Activity  Alcohol use: No   Drug use: No   Sexual activity: Yes  Other Topics Concern   Not on file  Social History Narrative   Not on file   Social Determinants of Health   Financial Resource Strain: Low Risk    Difficulty of Paying Living Expenses: Not hard at all  Food Insecurity: No Food Insecurity   Worried About Charity fundraiser in the Last Year: Never true   [place] in the Last Year: Never true  Transportation Needs: No Transportation Needs   Lack of Transportation (Medical): No   Lack of Transportation (Non-Medical): No  Physical Activity: Insufficiently Active   Days of Exercise per Week: 3 days   Minutes of Exercise per Session: 10 min  Stress: Stress Concern Present   Feeling of Stress : Very much  Social Connections: Not on file     Family History: The patient's family history includes Cancer in his mother; Diabetes in his  father; Heart attack in his father; Hypertension in his brother, father, and mother; Irregular heart beat in his brother.  ROS:   Please see the history of present illness.    All other systems reviewed and are negative.  EKGs/Labs/Other Studies Reviewed:    The following studies were reviewed today: Coronary angiography report was discussed with the patient at length.  LEFT HEART CATH AND CORONARY ANGIOGRAPHY    Conclusion    Prox RCA to Mid RCA lesion is 5% stenosed. Prox RCA lesion is 25% stenosed. Dist RCA lesion is 70% stenosed. Mid LM to Dist LM lesion is 25% stenosed. Ost Cx lesion is 20% stenosed. Prox Cx to Mid Cx lesion is 30% stenosed. Prox LAD to Mid LAD lesion is 20% stenosed. 1st Diag lesion is 30% stenosed. Mid LAD lesion is 50% stenosed.   There is evidence for moderate coronary calcification involving predominantly the LAD and RCA.     The left main has smooth 25% mid stenosis; there is calcification in the proximal LAD with narrowing of 25% followed by 40-50% mid stenosis and 30% diagonal stenosis; the circumflex ostium has 20% stenosis with 30% mid stenosis; the tandem RCA stents are widely patent with minimal intimal hyperplasia. There is 30% stenosis proximal to the stent.  There is 65 to 70% stenosis in the distal RCA beyond the acute margin before the PDA takeoff.   LVEDP 19 mm Hg.     RECOMMENDATION: Hydration post cath with creatinine 1.92.  Recommend initiation of antianginal  medical therapy.  Aggressive  lipid-lowering therapy; recommend titration of rosuvastatin to 40 mg.     Recent Labs: 05/04/2021: TSH 2.581 05/06/2021: ALT 14; Hemoglobin 14.2; Magnesium 2.2; Platelets 108 05/11/2021: B Natriuretic Peptide 659.7; BUN 24; Creatinine, Ser 1.75; Potassium 3.5; Sodium 141  Recent Lipid Panel    Component Value Date/Time   CHOL  02/10/2011 0700    93        ATP III CLASSIFICATION:  <200     mg/dL   Desirable  200-239  mg/dL   Borderline High   >=240    mg/dL   High          TRIG 149 02/10/2011 0700   HDL 30 (L) 02/10/2011 0700   CHOLHDL 3.1 02/10/2011 0700   VLDL 30 02/10/2011 0700   LDLCALC  02/10/2011 0700    33        Total Cholesterol/HDL:CHD Risk Coronary Heart Disease Risk Table  Men   Women  1/2 Average Risk   3.4   3.3  Average Risk       5.0   4.4  2 X Average Risk   9.6   7.1  3 X Average Risk  23.4   11.0        Use the calculated Patient Ratio above and the CHD Risk Table to determine the patient's CHD Risk.        ATP III CLASSIFICATION (LDL):  <100     mg/dL   Optimal  100-129  mg/dL   Near or Above                    Optimal  130-159  mg/dL   Borderline  160-189  mg/dL   High  >190     mg/dL   Very High    Physical Exam:    VS:  BP 124/74   Pulse 86   Ht 6\' 1"  (1.854 m)   Wt 226 lb 0.6 oz (102.5 kg)   SpO2 97%   BMI 29.82 kg/m     Wt Readings from Last 3 Encounters:  05/20/21 226 lb 0.6 oz (102.5 kg)  05/11/21 235 lb 3.2 oz (106.7 kg)  05/06/21 233 lb 7.5 oz (105.9 kg)     GEN: Patient is in no acute distress HEENT: Normal NECK: No JVD; No carotid bruits LYMPHATICS: No lymphadenopathy CARDIAC: Hear sounds regular, 2/6 systolic murmur at the apex. RESPIRATORY:  Clear to auscultation without rales, wheezing or rhonchi  ABDOMEN: Soft, non-tender, non-distended MUSCULOSKELETAL:  No edema; No deformity  SKIN: Warm and dry NEUROLOGIC:  Alert and oriented x 3 PSYCHIATRIC:  Normal affect   Signed, Jenean Lindau, MD  05/20/2021 5:05 PM    [facility]

## 2021-05-21 LAB — BASIC METABOLIC PANEL
BUN/Creatinine Ratio: 15 (ref 10–24)
BUN: 26 mg/dL (ref 8–27)
CO2: 25 mmol/L (ref 20–29)
Calcium: 9.4 mg/dL (ref 8.6–10.2)
Chloride: 108 mmol/L — ABNORMAL HIGH (ref 96–106)
Creatinine, Ser: 1.79 mg/dL — ABNORMAL HIGH (ref 0.76–1.27)
Glucose: 126 mg/dL — ABNORMAL HIGH (ref 65–99)
Potassium: 4.3 mmol/L (ref 3.5–5.2)
Sodium: 146 mmol/L — ABNORMAL HIGH (ref 134–144)
eGFR: 41 mL/min/{1.73_m2} — ABNORMAL LOW (ref >59)

## 2021-05-25 ENCOUNTER — Telehealth (HOSPITAL_COMMUNITY): Payer: Self-pay | Admitting: *Deleted

## 2021-05-25 NOTE — Telephone Encounter (Signed)
Pts PCP Dr.Allison Snider left vm stating she saw pt and he is not on a beta blocker, ace, or arb. Wanted to inform our clinic and get a call back from provider.   Call back # [phone]   Routed to [place]

## 2021-05-27 ENCOUNTER — Telehealth (HOSPITAL_COMMUNITY): Payer: Self-pay | Admitting: Internal Medicine

## 2021-05-27 ENCOUNTER — Telehealth (HOSPITAL_COMMUNITY): Payer: Self-pay | Admitting: Cardiology

## 2021-05-27 DIAGNOSIS — I5022 Chronic systolic (congestive) heart failure: Secondary | ICD-10-CM

## 2021-05-27 DIAGNOSIS — I5042 Chronic combined systolic (congestive) and diastolic (congestive) heart failure: Secondary | ICD-10-CM

## 2021-05-27 NOTE — Telephone Encounter (Signed)
Went over lab results with patient.  AL amyloidosis effectively ruled out.  Discussed obtaining PYP scan and patient wishes to proceed.

## 2021-05-27 NOTE — Telephone Encounter (Signed)
Pt left voicemail on triage line requesting to only see one cardiologist.  -returned call to patient, reports he would prefer to have all cardiology needs at HF clinic. Doe snot see the importance in using multiple locations.   -advised will make note in chart. Maybe best to discuss further at follow up.

## 2021-05-28 NOTE — Progress Notes (Signed)
ADVANCED HEART FAILURE CLINIC NOTE  PCP: Dr. Raeanne Gathers Primary Cardiologist: Jyl Heinz HF Cardiologist: Dr. Haroldine Laws  HPI:  [name] [name] is a 70 y.o.  male  with a PMH significant for 3 vessel disease CAD, NSTEMI 2008 with PCI of RCA with 2 lapping DES, HTN, HLD, DM, and systolic HF.  He had an NSTEMI, October 2008, treated with DES x 2 (RCA and mid LAD).  EF was 55%.  Other disease included 40% LM mid and distal lesions.  LAD with 70% lesion in midsection after 1st diag, and 90% stenosis within that section as well.  LCX small but patent.  Mid AV groove with 50% tubular lesion.  RCA totally occluded prox and calcification in mid section.    He was admitted in March 2012 for dizziness and staggering while mowing.  Also had some chest tightness. HR on admit was in low 40s and coreg decreased. Ruled out for MI.  Had f/u ETT/Myoview on 02/20/11, Walked 8:01. Stopped due to dyspnea. No CP.  BP 164/93 at baseline.went up to 220/84 in stage I. ECG normal. Images with mild reversible defect at the base of the infero-septal wall.  Last nuc study 07/2020 with decreased EF to < 30%, findings of prior MI, no ischemia.  Echo w/ EF 35-40% (He has had PAF on Xarelto and is post-stroke, says Xarelto switched to Plavix).  Followed by hematology in [place] with erythrocytosis and thrombocytopenia. Gets phlebotomy.     Pt presented to ED 05/03/21 for SOB/DOE and weakness. Had PRN lasix but not taking it.  Workup significant for BNP 646  HS troponin 176.  EKG with RBBB and LPFB, rate 85, variable atrial pacemakers, variable degrees of AV block.  He was started on IV lasix.  He only received 3 doses and wanted to leave, he remained significantly volume overloaded.    He was seen in [facility] clinic post hospitalization, still with fatigue. Not active due to knee issues. Uses stationary bike ~20 minutes. Says Wilder Glade makes him too tired. Urinates well with lasix. Volume up and lasix increased.   Today  he returns for HF follow up. Overall feeling fine. Has fatigue some. Exercises on stationary bike, SOB with stairs. Denies increasing CP, dizziness, or PND/Orthopnea. Appetite ok. No fever or chills. Weight at home 299-233 pounds. Taking all medications.   ROS: All systems negative except as listed in HPI, PMH and Problem List.  SH:  Social History   Socioeconomic History   Marital status: Widowed    Spouse name: Not on file   Number of children: Not on file   Years of education: Not on file   Highest education level: Not on file  Occupational History   Not on file  Tobacco Use   Smoking status: Former    Packs/day: 0.50    Years: 10.00    Pack years: 5.00    Types: Cigarettes   Smokeless tobacco: Former    Types: Snuff, Chew    Quit date: 11/20/1968   Tobacco comments:    in his 20's  Vaping Use   Vaping Use: Never used  Substance and Sexual Activity   Alcohol use: No   Drug use: No   Sexual activity: Yes  Other Topics Concern   Not on file  Social History Narrative   Not on file   Social Determinants of Health   Financial Resource Strain: Low Risk    Difficulty of Paying Living Expenses: Not hard at all  Food  Insecurity: No Food Insecurity   Worried About Charity fundraiser in the Last Year: Never true   Ran Out of Food in the Last Year: Never true  Transportation Needs: No Transportation Needs   Lack of Transportation (Medical): No   Lack of Transportation (Non-Medical): No  Physical Activity: Insufficiently Active   Days of Exercise per Week: 3 days   Minutes of Exercise per Session: 10 min  Stress: Stress Concern Present   Feeling of Stress : Very much  Social Connections: Not on file  Intimate Partner Violence: Not on file   FH:  Family History  Problem Relation Age of Onset   Hypertension Mother    Cancer Mother    Heart attack Father    Hypertension Father    Diabetes Father    Hypertension Brother    Irregular heart beat Brother    Past  Medical History:  Diagnosis Date   Acute on chronic congestive heart failure ([place])    Acute on chronic systolic CHF (congestive heart failure) ([place]) 05/04/2021   Arthritis of left knee 12/02/2020   Bilateral leg edema 05/04/2017   CAD (coronary artery disease)    nonST elevated MI in Oct 2008 treated w/2 drug -eluting stents to the RCA and  mid LAD, EF 55%   CAD (coronary artery disease), native coronary artery 11/12/2018   Formatting of this note might be different from the original. S/p MI - Dr. 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 Hypertension  10/1 IMO update   Gout, unspecified 10/14/2007   Overview:  Gout  10/1 IMO update  Formatting of this note might be different from the original. Gout  10/1 IMO update   History of colonic polyps 11/12/2018   Overview:  rpt colonoscopy 5/17; Dr. Haig Prophet of this note might be different from the original. rpt colonoscopy 5/17; Dr. Erlene Quan   History of kidney stones    History of nephrolithiasis 11/12/2018   History of smoking 11/12/2018   Overview:  quit 2000  Formatting of this note might be different from the original. quit 2000   HTN (hypertension)    Hyperlipidemia    HYPERTENSION, BENIGN 06/15/2009   Inguinal hernia without mention of obstruction or gangrene, recurrent unilateral or unspecified 11/12/2018   Ischemic cardiomyopathy 08/12/2020   Medication intolerance 11/12/2018   Mixed hyperlipidemia 11/12/2018   Old myocardial infarction 11/12/2018   Paroxysmal atrial fibrillation  ([place]) 07/16/2020   Pre-operative cardiovascular examination 11/12/2018   Primary osteoarthritis of both knees 03/05/2017   Considering Zilretta injections. Good improvement with corticosteroid injections however short-lived. Mild improvement with Visco supplementation.   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 Diabetes Mellitus   Unilateral primary osteoarthritis, right knee 11/15/2018   Current  Outpatient Medications  Medication Sig Dispense Refill   albuterol (VENTOLIN HFA) 108 (90 Base) MCG/ACT inhaler Inhale 2 puffs into the lungs every 6 (six) hours as needed for shortness of breath.     ALPRAZolam (XANAX) 0.25 MG tablet Take 0.25 mg by mouth at bedtime as needed for anxiety.      apixaban (ELIQUIS) 5 MG TABS tablet Take 1 tablet (5 mg total) by mouth 2 (two) times daily. 60 tablet 0   CIALIS 20 MG tablet Take 20 mg by mouth daily as needed for erectile dysfunction.      colchicine 0.6 MG tablet Take 0.6 mg by mouth daily as needed for other. Gout flare up     cyanocobalamin (,VITAMIN B-12,) 1000 MCG/ML injection Inject 1,000 mcg into the muscle every 30 (thirty) days.      dapagliflozin propanediol (FARXIGA) 10 MG TABS tablet Take 1 tablet (10 mg total) by mouth at bedtime. 30 tablet 11   fluticasone (FLONASE) 50 MCG/ACT nasal spray Place 1 spray into both nostrils daily as needed for allergies or rhinitis.     furosemide (LASIX) 40 MG tablet Take 1 tablet (40 mg total) by mouth daily. 30 tablet 3   LANTUS SOLOSTAR 100 UNIT/ML Solostar Pen Inject 21 Units into the skin at bedtime.     levalbuterol (XOPENEX HFA) 45 MCG/ACT inhaler Inhale 1 puff into the lungs every 4  (four) hours as needed for wheezing.     potassium chloride SA (KLOR-CON) 20 MEQ tablet Take 1 tablet (20 mEq total) by mouth daily. 30 tablet 3   rosuvastatin (CRESTOR) 40 MG tablet Take 1 tablet (40 mg total) by mouth at bedtime. 30 tablet 3   traMADol (ULTRAM) 50 MG tablet Take 25 mg by mouth every 4 (four) hours as needed for pain or severe pain.     No current facility-administered medications for this encounter.   BP 112/76   Pulse 79   Wt 106 kg (233 lb 9.6 oz)   SpO2 99%   BMI 30.82 kg/m   Wt Readings from Last 3 Encounters:  05/30/21 106 kg (233 lb 9.6 oz)  05/20/21 102.5 kg (226 lb 0.6 oz)  05/11/21 106.7 kg (235 lb 3.2 oz)   PHYSICAL EXAM: General:  NAD. No resp difficulty, appears older than stated age. HEENT: Normal Neck: Supple. JVP 6-7. Carotids 2+ bilat; no bruits. No lymphadenopathy or thryomegaly appreciated. Cor: PMI nondisplaced. Regular rate & rhythm. No rubs, gallops or murmurs. Lungs: Clear Abdomen: Obese, nontender, nondistended. No hepatosplenomegaly. No bruits or masses. Good bowel sounds. Extremities: No cyanosis, clubbing, rash, 1-2+ LE edema, compression hose in place. Neuro: Alert & oriented x 3, cranial nerves grossly intact. Moves all 4 extremities w/o difficulty. Affect pleasant.  ECG: SR with AVB RBBB PR 286 ms, QRS 202 ms (personally reviewed). ReDs: 37%  ASSESSMENT & PLAN:  Chronic systolic CHF/cardiomyopathy  - Echo (9/21):  EF 35-40%, nuclear stress test same time showed small inferior basal wall infarct but no ischemia (high risk due to EF 21%) - LHC (6/22): Moderate CAD but no significant obstruction requiring PCI, LVEDP 64mmHg. - Echo (6/22): EF 20-25%, Severe LV hypertrophy, G3DD. - No obvious uncontrolled HTN, ETOH/drug abuse, viral etiology for cardiomyopathy. - Echo findings, conduction issues, renal dysfunction, extreme sensitivity to medications, cell line abnormalities are concerning for cardiac amyloidosis. - MM panel & urine  immunofixation negative. PYP scan has been ordered. - NYHA class II-III symptoms, volume mildly elevated on exam, REDS 37% - Increase lasix  40 mg bid x 3 days, then back to 40 mg daily. - Start spiro 12.5 mg q hs. - Continue Farxiga 10 mg qhs. - Consider losartan vs ARNi next. - BMET today, repeat in 10 days & in 3 weeks.  2. CAD  - Cath (6/22) w/ moderate coronary artery disease but no significant obstruction requiring PCI. - Continue rosuvastatin, Eliquis.    3. 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 - With ? amyloid and recent acute decompensation, hold av nodal agents.   4. Paroxysmal atrial fibrillation - He is in NSR today on ECG. - CHADSVASC 7. - No AV nodal blockers yet.  - On Eliquis. No bleeding. - CBC today.   5. CKD stage IIIb - Baseline Cr 1.77 - BMET today.   6. Erythrocytosis, Thrombocytopenia - Follows with hematology, Dr. Jorje Guild.  - Receives periodic phlebotomy.  7. T2DM: - Continue Farxiga. No GU symptoms. - A1c 6.6 (6/22).   Follow up in 3 weeks with PharmD for medication titration and volume status.  Allena Katz, FNP-BC 05/30/21

## 2021-05-30 ENCOUNTER — Other Ambulatory Visit: Payer: Self-pay

## 2021-05-30 ENCOUNTER — Encounter (HOSPITAL_COMMUNITY): Payer: Self-pay

## 2021-05-30 ENCOUNTER — Other Ambulatory Visit (HOSPITAL_COMMUNITY): Payer: Self-pay

## 2021-05-30 ENCOUNTER — Telehealth (HOSPITAL_COMMUNITY): Payer: Self-pay | Admitting: Pharmacy Technician

## 2021-05-30 ENCOUNTER — Other Ambulatory Visit (HOSPITAL_COMMUNITY): Payer: Self-pay | Admitting: *Deleted

## 2021-05-30 ENCOUNTER — Ambulatory Visit (HOSPITAL_COMMUNITY)
Admission: RE | Admit: 2021-05-30 | Discharge: 2021-05-30 | Disposition: A | Discharge status: Home / Self Care | Payer: Medicare Other | Source: Ambulatory Visit | Attending: Family Medicine | Admitting: Family Medicine

## 2021-05-30 VITALS — BP 112/76 | HR 79 | Wt 233.6 lb

## 2021-05-30 DIAGNOSIS — I2510 Atherosclerotic heart disease of native coronary artery without angina pectoris: Secondary | ICD-10-CM

## 2021-05-30 DIAGNOSIS — Z955 Presence of coronary angioplasty implant and graft: Secondary | ICD-10-CM | POA: Insufficient documentation

## 2021-05-30 DIAGNOSIS — I480 Paroxysmal atrial fibrillation: Secondary | ICD-10-CM

## 2021-05-30 DIAGNOSIS — Z833 Family history of diabetes mellitus: Secondary | ICD-10-CM | POA: Insufficient documentation

## 2021-05-30 DIAGNOSIS — D696 Thrombocytopenia, unspecified: Secondary | ICD-10-CM | POA: Diagnosis not present

## 2021-05-30 DIAGNOSIS — I5022 Chronic systolic (congestive) heart failure: Secondary | ICD-10-CM | POA: Diagnosis not present

## 2021-05-30 DIAGNOSIS — Z794 Long term (current) use of insulin: Secondary | ICD-10-CM | POA: Insufficient documentation

## 2021-05-30 DIAGNOSIS — I130 Hypertensive heart and chronic kidney disease with heart failure and stage 1 through stage 4 chronic kidney disease, or unspecified chronic kidney disease: Secondary | ICD-10-CM | POA: Diagnosis not present

## 2021-05-30 DIAGNOSIS — Z8249 Family history of ischemic heart disease and other diseases of the circulatory system: Secondary | ICD-10-CM | POA: Diagnosis not present

## 2021-05-30 DIAGNOSIS — Z7901 Long term (current) use of anticoagulants: Secondary | ICD-10-CM | POA: Insufficient documentation

## 2021-05-30 DIAGNOSIS — E1159 Type 2 diabetes mellitus with other circulatory complications: Secondary | ICD-10-CM | POA: Diagnosis not present

## 2021-05-30 DIAGNOSIS — Z7902 Long term (current) use of antithrombotics/antiplatelets: Secondary | ICD-10-CM | POA: Diagnosis not present

## 2021-05-30 DIAGNOSIS — Z87891 Personal history of nicotine dependence: Secondary | ICD-10-CM | POA: Diagnosis not present

## 2021-05-30 DIAGNOSIS — D751 Secondary polycythemia: Secondary | ICD-10-CM | POA: Diagnosis not present

## 2021-05-30 DIAGNOSIS — I440 Atrioventricular block, first degree: Secondary | ICD-10-CM | POA: Diagnosis not present

## 2021-05-30 DIAGNOSIS — N1832 Chronic kidney disease, stage 3b: Secondary | ICD-10-CM | POA: Diagnosis not present

## 2021-05-30 DIAGNOSIS — E1122 Type 2 diabetes mellitus with diabetic chronic kidney disease: Secondary | ICD-10-CM | POA: Insufficient documentation

## 2021-05-30 LAB — CBC
HCT: 48.8 % (ref 39.0–52.0)
Hemoglobin: 15.1 g/dL (ref 13.0–17.0)
MCH: 26.4 pg (ref 26.0–34.0)
MCHC: 30.9 g/dL (ref 30.0–36.0)
MCV: 85.2 fL (ref 80.0–100.0)
Platelets: 96 10*3/uL — ABNORMAL LOW (ref 150–400)
RBC: 5.73 MIL/uL (ref 4.22–5.81)
RDW: 16.8 % — ABNORMAL HIGH (ref 11.5–15.5)
WBC: 5.1 10*3/uL (ref 4.0–10.5)
nRBC: 0 % (ref 0.0–0.2)

## 2021-05-30 LAB — BASIC METABOLIC PANEL
Anion gap: 6 (ref 5–15)
BUN: 23 mg/dL (ref 8–23)
CO2: 30 mmol/L (ref 22–32)
Calcium: 9.2 mg/dL (ref 8.9–10.3)
Chloride: 105 mmol/L (ref 98–111)
Creatinine, Ser: 2.01 mg/dL — ABNORMAL HIGH (ref 0.61–1.24)
GFR, Estimated: 35 mL/min — ABNORMAL LOW (ref >60)
Glucose, Bld: 114 mg/dL — ABNORMAL HIGH (ref 70–99)
Potassium: 3.9 mmol/L (ref 3.5–5.1)
Sodium: 141 mmol/L (ref 135–145)

## 2021-05-30 MED ORDER — FUROSEMIDE 40 MG PO TABS: 40.0000 mg | ORAL_TABLET | Freq: Every day | ORAL | 3 refills | Status: AC

## 2021-05-30 MED ORDER — SPIRONOLACTONE 25 MG PO TABS: 12.5000 mg | ORAL_TABLET | Freq: Every day | ORAL | 3 refills | Status: DC

## 2021-05-30 MED ORDER — ROSUVASTATIN CALCIUM 40 MG PO TABS: 40.0000 mg | ORAL_TABLET | Freq: Every day | ORAL | 3 refills | Status: AC

## 2021-05-30 MED ORDER — POTASSIUM CHLORIDE CRYS ER 20 MEQ PO TBCR: 20.0000 meq | EXTENDED_RELEASE_TABLET | Freq: Every day | ORAL | 3 refills | Status: AC

## 2021-05-30 NOTE — Progress Notes (Signed)
ReDS Vest / Clip - 05/30/21 1516       ReDS Vest / Clip   Station Marker D    Ruler Value 32    ReDS Value Range Moderate volume overload    ReDS Actual Value 36

## 2021-05-30 NOTE — Telephone Encounter (Signed)
Advanced Heart Failure Patient Advocate Encounter  Patient was seen in clinic today and expressed financial concern with Eliquis and Wilder Glade (patient is in the donut hole).   The current 30 day co-pay for Wilder Glade is $141 (donut hole), which is a barrier.Was able to obtain a grant for [place].   Started an application for Eliquis assistance. Patient is aware that he has about $45 left to meet the 3% OOP. He will need to bring in POI as well.   Will fax in once signatures are obtained. Fatima Sanger information given to the patient to take to the pharmacy.   Member ID: [id number] Group ID: [id number] RxBin ID: [id number] PCN: [id number] Eligibility Start Date: 03/01/2021 Eligibility End Date: 05/29/2022 Assistance Amount: $1,000.00

## 2021-05-30 NOTE — Addendum Note (Signed)
Encounter addended by: Shonna Chock, CMA on: 3/41/9379 4:25 PM  Actions taken: Clinical Note Signed

## 2021-05-30 NOTE — Patient Instructions (Addendum)
EKG done today.  Labs done today. We will contact you only if your labs are abnormal.  START Spironolactone 12.5mg  (1/2 tablet) by mouth daily at bedtime.  INCREASE Lasix to 40mg (1 tablet) by mouth 2 times daily for 3 days THEN DECREASE back to 40mg (1 tablet) by mouth daily.   No other medication changes were made. Please continue all current medications as prescribed.  Your physician recommends that you schedule a follow-up appointment in: 10 days for a lab only appointment and in 3-4 weeks with our pharmacy clinic here in our office.   If you have any questions or concerns before your next appointment please send [place] a message through [place] or call our office at [phone].    TO LEAVE A MESSAGE FOR THE NURSE SELECT OPTION 2, PLEASE LEAVE A MESSAGE INCLUDING: YOUR NAME DATE OF BIRTH CALL BACK NUMBER REASON FOR CALL**this is important as we prioritize the call backs  YOU WILL RECEIVE A CALL BACK THE SAME DAY AS LONG AS YOU CALL BEFORE 4:00 PM   Do the following things EVERYDAY: Weigh yourself in the morning before breakfast. Write it down and keep it in a log. Take your medicines as prescribed Eat low salt foods--Limit salt (sodium) to 2000 mg per day.  Stay as active as you can everyday Limit all fluids for the day to less than 2 liters   At the [facility], you and your health needs are our priority. As part of our continuing mission to provide you with exceptional heart care, we have created designated Provider Care Teams. These Care Teams include your primary Cardiologist (physician) and Advanced Practice Providers (APPs- Physician Assistants and Nurse Practitioners) who all work together to provide you with the care you need, when you need it.   You may see any of the following providers on your designated Care Team at your next follow up: Dr Glori Bickers Dr Haynes Kerns, NP Lyda Jester, [place] Audry Riles, PharmD   Please be sure  to bring in all your medications bottles to every appointment.

## 2021-05-30 NOTE — Progress Notes (Signed)
Medication Samples have been provided to the patient.  Drug name: Eliquis       Strength: 5mg         Qty: 4  LOT: [id number]  Exp.Date: 04/2023  Dosing instructions: take 1 tablet po bid  The patient has been instructed regarding the correct time, dose, and frequency of taking this medication, including desired effects and most common side effects.   Krystie Leiter R Raychel Dowler 7:48 PM 05/30/2021

## 2021-06-07 NOTE — Telephone Encounter (Signed)
Sent in application via fax. Patient did not include POI, application will initially be denied.   Will follow up.

## 2021-06-09 ENCOUNTER — Other Ambulatory Visit: Payer: Self-pay

## 2021-06-09 ENCOUNTER — Ambulatory Visit (HOSPITAL_COMMUNITY)
Admission: RE | Admit: 2021-06-09 | Discharge: 2021-06-09 | Disposition: A | Discharge status: Home / Self Care | Payer: Medicare Other | Source: Ambulatory Visit | Attending: Cardiology | Admitting: Cardiology

## 2021-06-09 DIAGNOSIS — I5022 Chronic systolic (congestive) heart failure: Secondary | ICD-10-CM | POA: Diagnosis not present

## 2021-06-09 LAB — BASIC METABOLIC PANEL
Anion gap: 7 (ref 5–15)
BUN: 22 mg/dL (ref 8–23)
CO2: 28 mmol/L (ref 22–32)
Calcium: 9 mg/dL (ref 8.9–10.3)
Chloride: 106 mmol/L (ref 98–111)
Creatinine, Ser: 1.89 mg/dL — ABNORMAL HIGH (ref 0.61–1.24)
GFR, Estimated: 38 mL/min — ABNORMAL LOW (ref >60)
Glucose, Bld: 166 mg/dL — ABNORMAL HIGH (ref 70–99)
Potassium: 3.8 mmol/L (ref 3.5–5.1)
Sodium: 141 mmol/L (ref 135–145)

## 2021-06-13 ENCOUNTER — Other Ambulatory Visit: Payer: Self-pay

## 2021-06-15 ENCOUNTER — Ambulatory Visit: Payer: Medicare Other | Admitting: Cardiology

## 2021-06-16 ENCOUNTER — Telehealth (HOSPITAL_COMMUNITY): Payer: Self-pay | Admitting: *Deleted

## 2021-06-16 NOTE — Telephone Encounter (Signed)
Pt called stating his bp has been running low 100/69 after taking medications. Pt c/o dizziness when he is up walking around. Pt wants to know if he needs to make any medication changes.   Routed to Darrick Grinder ,[place] for advice

## 2021-06-16 NOTE — Telephone Encounter (Signed)
Hold lasix and potassium x 2 days then restart at current dose.   Please call.   Hassani Sliney NP-C  3:16 PM

## 2021-06-17 NOTE — Telephone Encounter (Signed)
Pt aware and agreeable with plan.  

## 2021-06-23 ENCOUNTER — Telehealth (HOSPITAL_COMMUNITY): Payer: Self-pay | Admitting: *Deleted

## 2021-06-23 NOTE — Telephone Encounter (Signed)
Pt left vm stating he is retaining fluid especially in his knees. Pt noticed swelling a few days ago but says it has limited his mobility. Pt denies pain but said the swelling affects his balance. Pts weight is stable at 233lbs and he denies shortness of breath.  Pt admitted to having a couple of peach milkshakes but no other diet changes.   Routed to [employer], [place] for advice

## 2021-06-23 NOTE — Progress Notes (Signed)
PCP: Dr. Raeanne Gathers Primary Cardiologist: Jyl Heinz HF Cardiologist: Dr. Haroldine Laws  HPI:  [name] [name] is a 70 y.o.  male  with a PMH significant for 3 vessel disease CAD, NSTEMI 2008 with PCI of RCA with 2 lapping DES, HTN, HLD, DM, and systolic HF.   He had an NSTEMI, October 2008, treated with DES x 2 (RCA and mid LAD).  EF was 55%.  Other disease included 40% LM mid and distal lesions.  LAD with 70% lesion in midsection after 1st diag, and 90% stenosis within that section as well.  LCX small but patent.  Mid AV groove with 50% tubular lesion.  RCA totally occluded prox and calcification in mid section.   He was admitted in March 2012 for dizziness and staggering while mowing.  Also had some chest tightness. HR on admit was in low 40s and carvedilol was decreased. Ruled out for MI.  Had f/u ETT/Myoview on 02/20/11, Walked 8:01. Stopped due to dyspnea. No CP.  BP 164/93 at baseline went up to 220/84 in stage I. ECG normal. Images with mild reversible defect at the base of the infero-septal wall.   Last nuc study 07/2020 with decreased EF to < 30%, findings of prior MI, no ischemia.  Echo w/ EF 35-40% (He has had PAF on Xarelto and is post-stroke, says Xarelto switched to Plavix).  Followed by hematology in [place] with erythrocytosis and thrombocytopenia. Gets phlebotomy.     Pt presented to ED 05/03/21 for SOB/DOE and weakness. Had PRN furosemide but not taking it.  Workup significant for BNP 646, HS troponin 176.  EKG with RBBB and LPFB, rate 85, variable atrial pacemakers, variable degrees of AV block.  He was started on IV furosemide.  He only received 3 doses and wanted to leave, he remained significantly volume overloaded.     He was seen in [facility] post hospitalization, still with fatigue. Not active due to knee issues. Uses stationary bike ~20 minutes. Says Wilder Glade makes him too tired. Urinates well with furosemide. Volume up and furosemide increased.    Recently  returned to HF Clinic for HF follow up 05/30/21. Overall was feeling fine. Had some fatigue. Was exercising on stationary bike, gets SOB with stairs. Denied increasing CP, dizziness, or PND/Orthopnea. Appetite was ok. No fever or chills. Weight at home was 229-233 lbs. Reported taking all medications.  Today he returns to HF clinic for pharmacist medication titration. At last visit with APP, spironolactone 12.5 mg daily was initiated. He then called the clinic 06/16/21 and reported dizziness and low Bps (100/69) after taking meds. He was instructed to hold furosemide and potassium x2 days, then resume at previous dose. He then reached out to clinic again with complaints of swelling/fluid retention in his knees. He was instructed to take furosemide 40 mg BID x1 day. He noted swelling improved after that increase.  Overall he is doing ok today. His main complaint is knee pain. The extra furosemide did help the knee swelling, but it is still painful. Stated he took a colchicine tablet last night, but was unsurprised it did not help as it does not feel like gout pain. He called his PCP today to try and get an appointment to be seen for the knee pain. Has not had any dizziness since the episode on 06/16/21. No increased SOB. Activity has been limited by knee pain. Does not weigh himself daily at home. Weight in clinic is down 10 lbs from last visit 1 month ago.  This is likely from increased PRN furosemide doses in the last week. Has 1-2+ bilateral LE edema, wearing compression hose. R knee is still swollen. No PND/orthopnea. BP 122/78 in clinic. He believes this is higher than his normal because he is in pain. Home SBP 110-112.    HF Medications: Spironolactone 12.5 mg daily Farxiga 10 mg daily Furosemide 40 mg daily Potassium 20 mEq daily  Has the patient been experiencing any side effects to the medications prescribed?  no  Does the patient have any problems obtaining medications due to transportation or  finances?   Has [facility] Medicare. Uses PAN grant for [employer]. Has applied for BMS assistance for Eliquis, but he needs to spend an additional [id number] OOP before application will be approved.   Understanding of regimen: good Understanding of indications: good Potential of compliance: good Patient understands to avoid NSAIDs. Patient understands to avoid decongestants.    Pertinent Lab Values: 06/09/21: Serum creatinine 1.89, BUN 22, Potassium 3.8, Sodium 141  Vital Signs: Weight: 222.8 lbs (last clinic weight: 233.6 lbs) Blood pressure: 122/78  Heart rate: 70   Assessment/Plan: Chronic systolic CHF/cardiomyopathy  - Echo (07/2020):  EF 35-40%, nuclear stress test same time showed small inferior basal wall infarct but no ischemia (high risk due to EF 21%) - LHC (04/2021): Moderate CAD but no significant obstruction requiring PCI, LVEDP 18mHg. - Echo (04/2021): EF 20-25%, Severe LV hypertrophy, G3DD. - No obvious uncontrolled HTN, ETOH/drug abuse, viral etiology for cardiomyopathy. - Echo findings, conduction issues, renal dysfunction, extreme sensitivity to medications, cell line abnormalities are concerning for cardiac amyloidosis. - MM panel & urine immunofixation negative. PYP scan has been ordered. - NYHA class II-III symptoms, volume status stable on exam, 1-2+ LEE and swelling in R knee, but weight down 10 lbs - Continue furosemide 40 mg daily  - Increase spironolactone to 25 mg daily. Repeat BMET 1 week.  - Continue Farxiga 10 mg daily.   2. CAD - Cath (04/2021) w/ moderate coronary artery disease but no significant obstruction requiring PCI. - Continue rosuvastatin, Eliquis.    3. 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 - With ? amyloid and recent acute decompensation, hold av nodal agents.   4. Paroxysmal atrial fibrillation - CHADSVASC 7. - No AV nodal blockers yet.  - On Eliquis. No bleeding.   5. CKD stage IIIb - Baseline Cr 1.77   6.  Erythrocytosis, Thrombocytopenia - Follows with hematology, Dr. SJorje Guild - Receives periodic phlebotomy.   7. T2DM: - Continue Farxiga. No GU symptoms. - A1c 6.6 (04/2021).  Follow up with [facility] Clinic in 3 weeks.   LAudry Riles PharmD, BCPS, BCCP, CPP Heart Failure Clinic Pharmacist [phone]

## 2021-06-24 NOTE — Telephone Encounter (Signed)
He can try increasing lasix to 40 mg bid x 1 day and take an extra KCl tablet to see if swelling improves. He also has a h/o gout. If this feels like prior gout flared and if no improvement w/ increase in lasix, can come in for BMP, BNP and uric acid labs.

## 2021-06-24 NOTE — Telephone Encounter (Signed)
Left detailed vm per pts request.

## 2021-06-27 NOTE — Telephone Encounter (Signed)
Pt called back c/o swelling in knees. Pt said the increase in lasix helped but wants to know if he can try it again (b/c swelling isn't completely gone) or can he come for office visit. Called pt back to get more information but no answer/left vm for return call.

## 2021-07-04 ENCOUNTER — Telehealth: Payer: Self-pay | Admitting: Orthopaedic Surgery

## 2021-07-04 ENCOUNTER — Ambulatory Visit (HOSPITAL_COMMUNITY)
Admission: RE | Admit: 2021-07-04 | Discharge: 2021-07-04 | Disposition: A | Discharge status: Home / Self Care | Payer: Medicare Other | Source: Ambulatory Visit | Attending: Cardiology | Admitting: Cardiology

## 2021-07-04 ENCOUNTER — Other Ambulatory Visit: Payer: Self-pay

## 2021-07-04 ENCOUNTER — Telehealth (HOSPITAL_COMMUNITY): Payer: Self-pay | Admitting: Pharmacist

## 2021-07-04 VITALS — BP 122/78 | HR 70 | Wt 222.8 lb

## 2021-07-04 DIAGNOSIS — E785 Hyperlipidemia, unspecified: Secondary | ICD-10-CM | POA: Diagnosis not present

## 2021-07-04 DIAGNOSIS — I2510 Atherosclerotic heart disease of native coronary artery without angina pectoris: Secondary | ICD-10-CM | POA: Diagnosis not present

## 2021-07-04 DIAGNOSIS — I252 Old myocardial infarction: Secondary | ICD-10-CM | POA: Diagnosis not present

## 2021-07-04 DIAGNOSIS — N1832 Chronic kidney disease, stage 3b: Secondary | ICD-10-CM | POA: Insufficient documentation

## 2021-07-04 DIAGNOSIS — D751 Secondary polycythemia: Secondary | ICD-10-CM | POA: Insufficient documentation

## 2021-07-04 DIAGNOSIS — Z79899 Other long term (current) drug therapy: Secondary | ICD-10-CM | POA: Diagnosis not present

## 2021-07-04 DIAGNOSIS — Z7901 Long term (current) use of anticoagulants: Secondary | ICD-10-CM | POA: Diagnosis not present

## 2021-07-04 DIAGNOSIS — I441 Atrioventricular block, second degree: Secondary | ICD-10-CM | POA: Insufficient documentation

## 2021-07-04 DIAGNOSIS — I429 Cardiomyopathy, unspecified: Secondary | ICD-10-CM | POA: Insufficient documentation

## 2021-07-04 DIAGNOSIS — I480 Paroxysmal atrial fibrillation: Secondary | ICD-10-CM | POA: Diagnosis not present

## 2021-07-04 DIAGNOSIS — I130 Hypertensive heart and chronic kidney disease with heart failure and stage 1 through stage 4 chronic kidney disease, or unspecified chronic kidney disease: Secondary | ICD-10-CM | POA: Insufficient documentation

## 2021-07-04 DIAGNOSIS — I5022 Chronic systolic (congestive) heart failure: Secondary | ICD-10-CM

## 2021-07-04 DIAGNOSIS — D696 Thrombocytopenia, unspecified: Secondary | ICD-10-CM | POA: Insufficient documentation

## 2021-07-04 MED ORDER — SPIRONOLACTONE 25 MG PO TABS: 25.0000 mg | ORAL_TABLET | Freq: Every day | ORAL | 3 refills | Status: DC

## 2021-07-04 NOTE — Patient Instructions (Signed)
It was a pleasure seeing you today!  MEDICATIONS: -We are changing your medications today -Increase spironolactone to 25 mg (1 tablet) daily -Call if you have questions about your medications.   NEXT APPOINTMENT: Return to clinic in 4 weeks with [facility].  In general, to take care of your heart failure: -Limit your fluid intake to 2 Liters (half-gallon) per day.   -Limit your salt intake to ideally 2-3 grams (2000-3000 mg) per day. -Weigh yourself daily and record, and bring that "weight diary" to your next appointment.  (Weight gain of 2-3 pounds in 1 day typically means fluid weight.) -The medications for your heart are to help your heart and help you live longer.   -Please contact us before stopping any of your heart medications.  Call the clinic at [phone] with questions or to reschedule future appointments.

## 2021-07-04 NOTE — Telephone Encounter (Signed)
Pt wants to know if he can see another doctor since he wont be able to see blackman until tomorrow.   CB [phone]

## 2021-07-04 NOTE — Telephone Encounter (Signed)
Provided patient with Eliquis samples.  Medication: Eliquis Quantity: 28 tablets  Lot: [id number] Expiration date: 04/2023  Audry Riles, PharmD, BCPS, CPP Heart Failure Clinic Pharmacist [phone]

## 2021-07-05 ENCOUNTER — Telehealth (HOSPITAL_COMMUNITY): Payer: Self-pay | Admitting: Pharmacist

## 2021-07-05 ENCOUNTER — Ambulatory Visit: Payer: Self-pay

## 2021-07-05 ENCOUNTER — Ambulatory Visit (INDEPENDENT_AMBULATORY_CARE_PROVIDER_SITE_OTHER): Payer: Medicare Other | Admitting: Orthopaedic Surgery

## 2021-07-05 ENCOUNTER — Encounter: Payer: Self-pay | Admitting: Orthopaedic Surgery

## 2021-07-05 DIAGNOSIS — I5022 Chronic systolic (congestive) heart failure: Secondary | ICD-10-CM

## 2021-07-05 DIAGNOSIS — M25561 Pain in right knee: Secondary | ICD-10-CM | POA: Diagnosis not present

## 2021-07-05 MED ORDER — METHYLPREDNISOLONE ACETATE 40 MG/ML IJ SUSP
40.0000 mg | INTRAMUSCULAR | Status: AC | PRN
  Administered 2021-07-05: 40 mg via INTRAMUSCULAR

## 2021-07-05 MED ORDER — LIDOCAINE HCL 1 % IJ SOLN
1.0000 mL | INTRAMUSCULAR | Status: AC | PRN
  Administered 2021-07-05: 1 mL

## 2021-07-05 NOTE — Progress Notes (Signed)
Office Visit Note   Patient: [name] [name]           Date of Birth: September 23, 1951           MRN: [id number] Visit Date: 07/05/2021              Requested by: Raeanne Gathers, MD [address] [place],  [place] [address] PCP: Raeanne Gathers, MD   Assessment & Plan: Visit Diagnoses:  1. Chronic pain of right knee     Plan: We will perform IT band stretching exercises reviewed with him.  We will see him back in just 2 weeks to see how he is doing overall.  Ace bandage was applied he will remove this later today from the right knee.  Questions encouraged and answered at length.  Follow-Up Instructions: Return in about 2 weeks (around 07/19/2021).   Orders:  Orders Placed This Encounter  Procedures   Trigger Point Inj   XR Knee 1-2 Views Right   No orders of the defined types were placed in this encounter.     Procedures: Trigger Point Inj  Date/Time: 07/05/2021 11:14 AM Performed by: Pete Pelt, PA-C Authorized by: Pete Pelt, PA-C   Indications:  Pain Total # of Trigger Points:  1 Location: lower extremity   Needle Size:  25 G Approach:  Lateral Medications #1:  1 mL lidocaine 1 %; 40 mg methylPREDNISolone acetate 40 MG/ML   Clinical Data: No additional findings.   Subjective: Chief Complaint  Patient presents with   Right Knee - Pain    HPI [name] [name] comes in today status post right total knee arthroplasty 11/15/2018.  He states knee has been doing well until about 3 to 4 days ago.  At that point time he started having pain lateral aspect knee.  No injury.  He is back on a walker due to pain.  He describes it as a sharp stabbing pain lateral aspect of the knee.  He has been doing a fair amount of biking.  He is wearing compression hose due to the lower diam of the edema. Review of Systems No fevers or chills.  Please see HPI otherwise negative or noncontributory.  Objective: Vital Signs: There were no vitals taken for this visit.  Physical  Exam General: Well-developed well-nourished male in no acute distress mood and affect appropriate. Psych: Alert and oriented x3 Ortho Exam Bilateral knees good range of motion of both knees.  Right knee no instability valgus varus stressing.  No abnormal warmth erythema knee.  Tenderness distal right IT band with maximal tenderness over the fibular head region laterally.  No tenderness medial or lateral joint line of the right knee.  Right knee surgical incisions well-healed calf supple nontender.  Right hip good range of motion extreme external rotation causes some discomfort lateral distal femur region. Specialty Comments:  No specialty comments available.  Imaging: XR Knee 1-2 Views Right  Result Date: 07/05/2021 Right knee 2 views: No acute fracture.  Knee is well located.  Total knee arthroplasty components well-seated.  No complicating features.    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   Arthritis of left knee 12/02/2020   Renal insufficiency 08/12/2020   Ischemic cardiomyopathy 08/12/2020   CAD (  coronary artery disease)    DM (diabetes mellitus) ([place])    HTN (hypertension)    Hyperlipidemia    Diabetes mellitus due to underlying condition with unspecified complications ([place]) 0000000   Paroxysmal atrial fibrillation ([place]) 07/16/2020   Erythrocytosis 06/01/2020   Unilateral primary osteoarthritis, right knee 11/15/2018   Status post total right knee replacement 11/15/2018   Depressive disorder 11/12/2018   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   History of colonic polyps 11/12/2018   History of nephrolithiasis 11/12/2018   History of smoking 11/12/2018   Inguinal hernia without mention of obstruction or gangrene, recurrent  unilateral or unspecified 11/12/2018   Medication intolerance 11/12/2018   Old myocardial infarction 11/12/2018   Mixed hyperlipidemia 11/12/2018   Pre-operative cardiovascular examination 11/12/2018   CAD (coronary artery disease), native coronary artery 11/12/2018   Bilateral leg edema 05/04/2017   Primary osteoarthritis of both knees 03/05/2017   CAP (community acquired pneumonia) 08/17/2016   Right carotid bruit 08/05/2014   HYPERTENSION, BENIGN 06/15/2009   CAD, NATIVE VESSEL 06/15/2009   Type II or unspecified type diabetes mellitus without mention of complication, not stated as uncontrolled 10/14/2007   Gout, unspecified 10/14/2007   Essential hypertension 10/14/2007   Past Medical History:  Diagnosis Date   Acute on chronic congestive heart failure (HCC)    Acute on chronic systolic CHF (congestive heart failure) ([place]) 05/04/2021   Arthritis of left knee 12/02/2020   Bilateral leg edema 05/04/2017   CAD (coronary artery disease)    nonST elevated MI in Oct 2008 treated w/2 drug -eluting stents to the RCA and  mid LAD, EF 55%   CAD (coronary artery disease), native coronary artery 11/12/2018   Formatting of this note might be different from the original. S/p MI - Dr. 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 Hypertension  10/1 IMO update   Gout, unspecified 10/14/2007   Overview:  Gout  10/1 IMO update  Formatting of this note might be different from the original. Gout  10/1 IMO update   History of colonic polyps 11/12/2018    Overview:  rpt colonoscopy 5/17; Dr. Haig Prophet of this note might be different from the original. rpt colonoscopy 5/17; Dr. Erlene Quan   History of kidney stones    History of nephrolithiasis 11/12/2018   History of smoking 11/12/2018   Overview:  quit 2000  Formatting of this note might be different from the original. quit 2000   HTN (hypertension)    Hyperlipidemia    HYPERTENSION, BENIGN 06/15/2009   Inguinal hernia without mention of obstruction or gangrene, recurrent unilateral or unspecified 11/12/2018   Ischemic cardiomyopathy 08/12/2020   Medication intolerance 11/12/2018   Mixed hyperlipidemia 11/12/2018   Old myocardial infarction 11/12/2018   Paroxysmal atrial fibrillation ([place]) 07/16/2020   Pre-operative cardiovascular examination 11/12/2018   Primary osteoarthritis of both knees 03/05/2017   Considering Zilretta injections. Good improvement with corticosteroid injections however short-lived. Mild improvement with Visco supplementation.   Renal insufficiency 08/12/2020   pt denies   Right carotid bruit 08/05/2014   Status  post total left knee replacement 12/03/2020   Status post total right knee replacement 11/15/2018   Type II or unspecified type diabetes mellitus without mention of complication, not stated as uncontrolled 10/14/2007   Formatting of this note might be different from the original. Diabetes Mellitus   Unilateral primary osteoarthritis, right knee 11/15/2018    Family History  Problem Relation Age of Onset   Hypertension Mother    Cancer Mother    Heart attack Father    Hypertension Father    Diabetes Father    Hypertension Brother    Irregular heart beat Brother     Past Surgical History:  Procedure Laterality Date   BILATERAL KNEE ARTHROSCOPY     CORONARY ANGIOPLASTY WITH STENT PLACEMENT     LAPAROSCOPIC INGUINAL HERNIA REPAIR     LEFT HEART CATH AND CORONARY ANGIOGRAPHY N/A 05/05/2021   Procedure: LEFT HEART CATH AND CORONARY ANGIOGRAPHY;   Surgeon: Troy Sine, MD;  Location: [facility];  Service: Cardiovascular;  Laterality: N/A;   NOSE SURGERY     TOTAL KNEE ARTHROPLASTY Right 11/15/2018   Procedure: RIGHT TOTAL KNEE ARTHROPLASTY;  Surgeon: Mcarthur Rossetti, MD;  Location: [facility];  Service: Orthopedics;  Laterality: Right;   TOTAL KNEE ARTHROPLASTY Left 12/03/2020   Procedure: LEFT TOTAL KNEE ARTHROPLASTY;  Surgeon: Mcarthur Rossetti, MD;  Location: [facility];  Service: Orthopedics;  Laterality: Left;   Social History   Occupational History   Not on file  Tobacco Use   Smoking status: Former    Packs/day: 0.50    Years: 10.00    Pack years: 5.00    Types: Cigarettes   Smokeless tobacco: Former    Types: Snuff, Chew    Quit date: 11/20/1968   Tobacco comments:    in his 20's  Vaping Use   Vaping Use: Never used  Substance and Sexual Activity   Alcohol use: No   Drug use: No   Sexual activity: Yes

## 2021-07-05 NOTE — Telephone Encounter (Signed)
Entered in error

## 2021-07-12 ENCOUNTER — Emergency Department (HOSPITAL_COMMUNITY): Payer: Medicare Other

## 2021-07-12 ENCOUNTER — Ambulatory Visit (HOSPITAL_COMMUNITY)
Admission: RE | Admit: 2021-07-12 | Discharge: 2021-07-12 | Disposition: A | Discharge status: Home / Self Care | Payer: Medicare Other | Source: Ambulatory Visit | Attending: Internal Medicine | Admitting: Internal Medicine

## 2021-07-12 ENCOUNTER — Other Ambulatory Visit (HOSPITAL_COMMUNITY): Payer: Self-pay | Admitting: Internal Medicine

## 2021-07-12 ENCOUNTER — Emergency Department (HOSPITAL_COMMUNITY)
Admission: EM | Admit: 2021-07-12 | Discharge: 2021-07-13 | Disposition: A | Discharge status: Home / Self Care | Payer: Medicare Other | Attending: Emergency Medicine | Admitting: Emergency Medicine

## 2021-07-12 ENCOUNTER — Other Ambulatory Visit: Payer: Self-pay

## 2021-07-12 ENCOUNTER — Encounter (HOSPITAL_COMMUNITY): Payer: Self-pay

## 2021-07-12 DIAGNOSIS — Z7984 Long term (current) use of oral hypoglycemic drugs: Secondary | ICD-10-CM | POA: Insufficient documentation

## 2021-07-12 DIAGNOSIS — J90 Pleural effusion, not elsewhere classified: Secondary | ICD-10-CM | POA: Diagnosis not present

## 2021-07-12 DIAGNOSIS — R5383 Other fatigue: Secondary | ICD-10-CM | POA: Diagnosis not present

## 2021-07-12 DIAGNOSIS — E119 Type 2 diabetes mellitus without complications: Secondary | ICD-10-CM | POA: Insufficient documentation

## 2021-07-12 DIAGNOSIS — I110 Hypertensive heart disease with heart failure: Secondary | ICD-10-CM | POA: Insufficient documentation

## 2021-07-12 DIAGNOSIS — I440 Atrioventricular block, first degree: Secondary | ICD-10-CM | POA: Diagnosis not present

## 2021-07-12 DIAGNOSIS — R0602 Shortness of breath: Secondary | ICD-10-CM | POA: Diagnosis not present

## 2021-07-12 DIAGNOSIS — I517 Cardiomegaly: Secondary | ICD-10-CM | POA: Diagnosis not present

## 2021-07-12 DIAGNOSIS — I491 Atrial premature depolarization: Secondary | ICD-10-CM | POA: Diagnosis not present

## 2021-07-12 DIAGNOSIS — R6889 Other general symptoms and signs: Secondary | ICD-10-CM | POA: Diagnosis not present

## 2021-07-12 DIAGNOSIS — I5043 Acute on chronic combined systolic (congestive) and diastolic (congestive) heart failure: Secondary | ICD-10-CM | POA: Diagnosis not present

## 2021-07-12 DIAGNOSIS — I5022 Chronic systolic (congestive) heart failure: Secondary | ICD-10-CM

## 2021-07-12 DIAGNOSIS — I499 Cardiac arrhythmia, unspecified: Secondary | ICD-10-CM | POA: Diagnosis not present

## 2021-07-12 DIAGNOSIS — I5042 Chronic combined systolic (congestive) and diastolic (congestive) heart failure: Secondary | ICD-10-CM | POA: Diagnosis not present

## 2021-07-12 DIAGNOSIS — Z794 Long term (current) use of insulin: Secondary | ICD-10-CM | POA: Insufficient documentation

## 2021-07-12 DIAGNOSIS — I2510 Atherosclerotic heart disease of native coronary artery without angina pectoris: Secondary | ICD-10-CM | POA: Insufficient documentation

## 2021-07-12 DIAGNOSIS — Z96653 Presence of artificial knee joint, bilateral: Secondary | ICD-10-CM | POA: Diagnosis not present

## 2021-07-12 DIAGNOSIS — Z87891 Personal history of nicotine dependence: Secondary | ICD-10-CM | POA: Insufficient documentation

## 2021-07-12 DIAGNOSIS — Z743 Need for continuous supervision: Secondary | ICD-10-CM | POA: Diagnosis not present

## 2021-07-12 DIAGNOSIS — Z79899 Other long term (current) drug therapy: Secondary | ICD-10-CM | POA: Diagnosis not present

## 2021-07-12 LAB — CBC WITH DIFFERENTIAL/PLATELET
Abs Immature Granulocytes: 0.02 10*3/uL (ref 0.00–0.07)
Basophils Absolute: 0.1 10*3/uL (ref 0.0–0.1)
Basophils Relative: 1 %
Eosinophils Absolute: 0.1 10*3/uL (ref 0.0–0.5)
Eosinophils Relative: 1 %
HCT: 50.9 % (ref 39.0–52.0)
Hemoglobin: 15.7 g/dL (ref 13.0–17.0)
Immature Granulocytes: 0 %
Lymphocytes Relative: 13 %
Lymphs Abs: 0.9 10*3/uL (ref 0.7–4.0)
MCH: 25.7 pg — ABNORMAL LOW (ref 26.0–34.0)
MCHC: 30.8 g/dL (ref 30.0–36.0)
MCV: 83.2 fL (ref 80.0–100.0)
Monocytes Absolute: 0.5 10*3/uL (ref 0.1–1.0)
Monocytes Relative: 8 %
Neutro Abs: 5.5 10*3/uL (ref 1.7–7.7)
Neutrophils Relative %: 77 %
Platelets: 101 10*3/uL — ABNORMAL LOW (ref 150–400)
RBC: 6.12 MIL/uL — ABNORMAL HIGH (ref 4.22–5.81)
RDW: 20.8 % — ABNORMAL HIGH (ref 11.5–15.5)
WBC: 7 10*3/uL (ref 4.0–10.5)
nRBC: 0 % (ref 0.0–0.2)

## 2021-07-12 LAB — COMPREHENSIVE METABOLIC PANEL
ALT: 17 U/L (ref 0–44)
AST: 24 U/L (ref 15–41)
Albumin: 4.1 g/dL (ref 3.5–5.0)
Alkaline Phosphatase: 128 U/L — ABNORMAL HIGH (ref 38–126)
Anion gap: 13 (ref 5–15)
BUN: 29 mg/dL — ABNORMAL HIGH (ref 8–23)
CO2: 24 mmol/L (ref 22–32)
Calcium: 9.7 mg/dL (ref 8.9–10.3)
Chloride: 103 mmol/L (ref 98–111)
Creatinine, Ser: 2.02 mg/dL — ABNORMAL HIGH (ref 0.61–1.24)
GFR, Estimated: 35 mL/min — ABNORMAL LOW (ref >60)
Glucose, Bld: 139 mg/dL — ABNORMAL HIGH (ref 70–99)
Potassium: 4.1 mmol/L (ref 3.5–5.1)
Sodium: 140 mmol/L (ref 135–145)
Total Bilirubin: 2.7 mg/dL — ABNORMAL HIGH (ref 0.3–1.2)
Total Protein: 7.1 g/dL (ref 6.5–8.1)

## 2021-07-12 LAB — BASIC METABOLIC PANEL
Anion gap: 8 (ref 5–15)
BUN: 27 mg/dL — ABNORMAL HIGH (ref 8–23)
CO2: 26 mmol/L (ref 22–32)
Calcium: 9.3 mg/dL (ref 8.9–10.3)
Chloride: 106 mmol/L (ref 98–111)
Creatinine, Ser: 1.77 mg/dL — ABNORMAL HIGH (ref 0.61–1.24)
GFR, Estimated: 41 mL/min — ABNORMAL LOW (ref >60)
Glucose, Bld: 134 mg/dL — ABNORMAL HIGH (ref 70–99)
Potassium: 3.9 mmol/L (ref 3.5–5.1)
Sodium: 140 mmol/L (ref 135–145)

## 2021-07-12 LAB — BRAIN NATRIURETIC PEPTIDE: B Natriuretic Peptide: 700.9 pg/mL — ABNORMAL HIGH (ref 0.0–100.0)

## 2021-07-12 LAB — TROPONIN I (HIGH SENSITIVITY): Troponin I (High Sensitivity): 215 ng/L (ref <18)

## 2021-07-12 NOTE — ED Triage Notes (Signed)
Pt brought in by EMS for shortness of breath and generalized weakness. Pt also reports bilateral leg swelling.

## 2021-07-12 NOTE — Progress Notes (Signed)
bm 

## 2021-07-12 NOTE — ED Notes (Signed)
Lab called troponin 215

## 2021-07-12 NOTE — ED Provider Notes (Signed)
Emergency Medicine Provider Triage Evaluation Note  [name] [name] , a 70 y.o. male  was evaluated in triage.  Pt complains of shortness of breath and fatigue.  He states that his legs are swollen at their usual baseline.  He notes that today he did go walk around [employer] for longer than he usually does his he has been trying to increase his activity.  He denies any known sick contacts, no known [place] contacts.  Review of Systems  Positive: Fatigue, shortness of breath Negative: Change in leg swelling  Physical Exam  BP (!) 141/94 (BP Location: Right Arm)   Pulse 77   Temp 98.1 F (36.7 C) (Oral)   Resp 20   Ht '6\' 1"'$  (1.854 m)   Wt 101 kg   SpO2 98%   BMI 29.38 kg/m  Gen:   Awake, no distress   Resp:  Normal effort  MSK:   Moves extremities without difficulty  Other:  Patient is awake and alert, speaks in full sentences, is in no obvious distress.  Medical Decision Making  Medically screening exam initiated at 9:07 PM.  Appropriate orders placed.  [name] was informed that the remainder of the evaluation will be completed by another provider, this initial triage assessment does not replace that evaluation, and the importance of remaining in the ED until their evaluation is complete.  Note: Portions of this report may have been transcribed using voice recognition software. Every effort was made to ensure accuracy; however, inadvertent computerized transcription errors may be present    Ollen Gross 07/12/21 [id number]    Godfrey Pick, MD 07/13/21 [phone]

## 2021-07-13 LAB — TROPONIN I (HIGH SENSITIVITY): Troponin I (High Sensitivity): 186 ng/L (ref <18)

## 2021-07-13 MED ORDER — FUROSEMIDE 20 MG PO TABS
40.0000 mg | ORAL_TABLET | Freq: Every day | ORAL | Status: DC
  Administered 2021-07-13: 40 mg via ORAL
  Filled 2021-07-13: qty 2

## 2021-07-13 MED ORDER — APIXABAN 5 MG PO TABS
5.0000 mg | ORAL_TABLET | Freq: Two times a day (BID) | ORAL | Status: DC
  Administered 2021-07-13: 5 mg via ORAL
  Filled 2021-07-13: qty 1

## 2021-07-13 MED ORDER — POTASSIUM CHLORIDE CRYS ER 20 MEQ PO TBCR
20.0000 meq | EXTENDED_RELEASE_TABLET | Freq: Every day | ORAL | Status: DC
  Administered 2021-07-13: 20 meq via ORAL
  Filled 2021-07-13: qty 1

## 2021-07-13 NOTE — ED Provider Notes (Signed)
[facility] EMERGENCY DEPARTMENT Provider Note   CSN: [id number] Arrival date & time: 07/12/21  2046     History Chief Complaint  Patient presents with   Shortness of Breath   Weakness    [name] [name] is a 70 y.o. male.  HPI He presents for evaluation of shortness of breath and weakness.  He has chronic lower extremity swelling.  He had an trigger point injection of the right knee, steroid, on 07/05/2021, by his orthopedic provider.  Cardiac oh done 05/06/2021 showed LV function decreased at 20 to 25%, and grade 3 diastolic dysfunction.  Cardiac catheterization on 05/05/2021 with three-vessel disease, plans for medication treatment including antianginal and lipid lowering therapy.    Patient states that yesterday prior to come to the ED he was a bit more active than usual, went walking in [employer], came to the hospital to get labs drawn, then spent the afternoon with a friend.  He has been eating well recently taking his medicines as prescribed.  He did not take the evening dose of medications last night because he was in the ED.  He states he feels okay while resting, and denies shortness of breath or chest pain.  There has been no recent fever, cough, headache, neck pain, or back pain.  There are no other known active modifying factors.  Past Medical History:  Diagnosis Date   Acute on chronic congestive heart failure (HCC)    Acute on chronic systolic CHF (congestive heart failure) ([place]) 05/04/2021   Arthritis of left knee 12/02/2020   Bilateral leg edema 05/04/2017   CAD (coronary artery disease)    nonST elevated MI in Oct 2008 treated w/2 drug -eluting stents to the RCA and  mid LAD, EF 55%   CAD (coronary artery disease), native coronary artery 11/12/2018   Formatting of this note might be different from the original. S/p MI - Dr. 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 Hypertension  10/1 IMO update   Gout, unspecified 10/14/2007   Overview:  Gout  10/1 IMO update  Formatting of this note might be different from the original. Gout  10/1 IMO update   History of colonic polyps 11/12/2018   Overview:  rpt colonoscopy 5/17; Dr. Haig Prophet of this note might be different from the original. rpt colonoscopy 5/17; Dr. Erlene Quan   History of kidney stones    History of nephrolithiasis 11/12/2018   History of smoking 11/12/2018   Overview:  quit 2000  Formatting of this note might be different from the original. quit 2000   HTN (hypertension)    Hyperlipidemia    HYPERTENSION, BENIGN 06/15/2009   Inguinal hernia without mention of obstruction or gangrene, recurrent unilateral or unspecified 11/12/2018   Ischemic cardiomyopathy 08/12/2020   Medication intolerance 11/12/2018   Mixed hyperlipidemia 11/12/2018   Old myocardial infarction 11/12/2018   Paroxysmal atrial fibrillation ([place]) 07/16/2020   Pre-operative cardiovascular examination 11/12/2018   Primary osteoarthritis of both knees 03/05/2017   Considering Zilretta injections. Good improvement with corticosteroid injections however short-lived. Mild  improvement with Visco supplementation.   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   Arthritis of left knee 12/02/2020   Renal insufficiency 08/12/2020   Ischemic cardiomyopathy 08/12/2020   CAD (coronary artery disease)    DM (diabetes mellitus) ([place])    HTN (hypertension)    Hyperlipidemia    Diabetes mellitus due to underlying condition with unspecified complications ([place]) 0000000   Paroxysmal atrial fibrillation ([place]) 07/16/2020   Erythrocytosis 06/01/2020   Unilateral primary osteoarthritis, right knee 11/15/2018   Status post total right knee replacement 11/15/2018   Depressive disorder 11/12/2018   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   History of colonic polyps 11/12/2018   History of nephrolithiasis 11/12/2018   History of smoking 11/12/2018   Inguinal hernia without mention of obstruction or gangrene, recurrent unilateral or unspecified 11/12/2018   Medication intolerance 11/12/2018   Old myocardial infarction 11/12/2018   Mixed hyperlipidemia 11/12/2018   Pre-operative cardiovascular examination 11/12/2018   CAD (coronary artery disease), native coronary artery 11/12/2018   Bilateral leg edema 05/04/2017   Primary osteoarthritis of both knees 03/05/2017   CAP (community acquired pneumonia) 08/17/2016   Right carotid bruit 08/05/2014   HYPERTENSION, BENIGN 06/15/2009   CAD, NATIVE VESSEL 06/15/2009   Type II or unspecified type diabetes mellitus without mention of complication, not stated as uncontrolled 10/14/2007   Gout, unspecified 10/14/2007   Essential hypertension 10/14/2007    Past Surgical History:  Procedure Laterality Date   BILATERAL KNEE ARTHROSCOPY     CORONARY  ANGIOPLASTY WITH STENT PLACEMENT     LAPAROSCOPIC INGUINAL HERNIA REPAIR     LEFT HEART CATH AND CORONARY ANGIOGRAPHY N/A 05/05/2021   Procedure: LEFT HEART CATH AND CORONARY ANGIOGRAPHY;  Surgeon: Troy Sine, MD;  Location: [place] [facility];  Service: Cardiovascular;  Laterality: N/A;   NOSE SURGERY     TOTAL KNEE ARTHROPLASTY Right 11/15/2018   Procedure: RIGHT TOTAL KNEE ARTHROPLASTY;  Surgeon: Mcarthur Rossetti, MD;  Location: [facility];  Service: Orthopedics;  Laterality: Right;   TOTAL KNEE ARTHROPLASTY Left 12/03/2020   Procedure: LEFT TOTAL KNEE ARTHROPLASTY;  Surgeon: Mcarthur Rossetti, MD;  Location: [facility];  Service: Orthopedics;  Laterality: Left;       Family History  Problem Relation Age of Onset   Hypertension Mother    Cancer Mother    Heart attack Father    Hypertension Father    Diabetes Father    Hypertension Brother    Irregular heart beat Brother     Social History   Tobacco Use   Smoking status: Former    Packs/day: 0.50    Years: 10.00    Pack years: 5.00    Types: Cigarettes   Smokeless tobacco: Former    Types: Snuff, Chew    Quit date: 11/20/1968   Tobacco  comments:    in his 86's  Vaping Use   Vaping Use: Never used  Substance Use Topics   Alcohol use: No   Drug use: No    Home Medications Prior to Admission medications   Medication Sig Start Date End Date Taking? Authorizing Provider  albuterol (VENTOLIN HFA) 108 (90 Base) MCG/ACT inhaler Inhale 2 puffs into the lungs every 6 (six) hours as needed for shortness of breath. 05/03/21   [provider]  ALPRAZolam Duanne Moron) 0.25 MG tablet Take 0.25 mg by mouth at bedtime as needed for anxiety.  11/27/16   [provider]  apixaban (ELIQUIS) 5 MG TABS tablet Take 1 tablet (5 mg total) by mouth 2 (two) times daily. 05/06/21 07/04/21  Elodia Florence., MD  CIALIS 20 MG tablet Take 20 mg by mouth daily as needed for erectile dysfunction.  02/06/17   [provider]  colchicine 0.6 MG tablet Take 0.6 mg by mouth daily as needed for other. Gout flare up 05/02/21   [provider]  cyanocobalamin (,VITAMIN B-12,) 1000 MCG/ML injection Inject 1,000 mcg into the muscle every 30 (thirty) days.  05/12/16   [provider]  dapagliflozin propanediol (FARXIGA) 10 MG TABS tablet Take 1 tablet (10 mg total) by mouth at bedtime. 05/11/21   Katherine Roan, MD  fluticasone (FLONASE) 50 MCG/ACT nasal spray Place 1 spray into both nostrils daily as needed for allergies or rhinitis.    [provider]  furosemide (LASIX) 40 MG tablet Take 1 tablet (40 mg total) by mouth daily. 05/30/21   Katherine Roan, MD  LANTUS SOLOSTAR 100 UNIT/ML Solostar Pen Inject 21 Units into the skin at bedtime. 03/14/20   [provider]  levalbuterol (XOPENEX HFA) 45 MCG/ACT inhaler Inhale 1 puff into the lungs every 4 (four) hours as needed for wheezing.    [provider]  potassium chloride SA (KLOR-CON) 20 MEQ tablet Take 1 tablet (20 mEq total) by mouth daily. 05/30/21   Katherine Roan, MD  rosuvastatin (CRESTOR) 40 MG tablet Take 1 tablet (40 mg total) by mouth at bedtime. 05/30/21   Katherine Roan, MD  spironolactone (ALDACTONE) 25 MG tablet Take 1 tablet (25 mg total) by mouth at bedtime. 07/04/21   Bensimhon, Shaune Pascal, MD  traMADol (ULTRAM) 50 MG tablet Take 25 mg by mouth every 4 (four) hours as needed for pain or severe pain. 11/13/20   [provider]    Allergies    Allopurinol, Aripiprazole, Bupropion, Buspirone, Desvenlafaxine, Duloxetine, Escitalopram, Fish oil, Fluticasone furoate, Hydroxyzine hcl, Nortriptyline, Paroxetine hcl, Ramipril, Sertraline, Venlafaxine, and Vilazodone  Review of Systems   Review of Systems  All other systems reviewed and are negative.  Physical Exam Updated Vital Signs BP 121/82   Pulse 89   Temp 98.6 F (37 C) (Oral)   Resp (!) 22   Ht '6\' 1"'$  (1.854 m)   Wt 101 kg    SpO2 99%   BMI 29.38 kg/m   Physical Exam Vitals and nursing note reviewed.  Constitutional:      General: He is not in acute distress.    Appearance: He is well-developed. He is not ill-appearing, toxic-appearing or diaphoretic.  HENT:     Head: Normocephalic and atraumatic.     Right Ear: External ear normal.     Left Ear: External ear normal.  Eyes:     Conjunctiva/sclera: Conjunctivae normal.     Pupils: Pupils are equal, round, and reactive  to light.  Neck:     Trachea: Phonation normal.  Cardiovascular:     Rate and Rhythm: Normal rate and regular rhythm.     Heart sounds: Normal heart sounds.  Pulmonary:     Effort: Pulmonary effort is normal. No respiratory distress.     Breath sounds: Normal breath sounds. No stridor.  Chest:     Chest wall: No tenderness.  Abdominal:     General: There is no distension.     Palpations: Abdomen is soft.     Tenderness: There is no abdominal tenderness.  Musculoskeletal:        General: Normal range of motion.     Cervical back: Normal range of motion and neck supple.     Right lower leg: No edema.     Left lower leg: Edema present.  Skin:    General: Skin is warm and dry.  Neurological:     Mental Status: He is alert and oriented to person, place, and time.     Cranial Nerves: No cranial nerve deficit.     Sensory: No sensory deficit.     Motor: No abnormal muscle tone.     Coordination: Coordination normal.     Comments: No dysarthria or aphasia  Psychiatric:        Mood and Affect: Mood normal.        Behavior: Behavior normal.        Thought Content: Thought content normal.        Judgment: Judgment normal.    ED Results / Procedures / Treatments   Labs (all labs ordered are listed, but only abnormal results are displayed) Labs Reviewed  COMPREHENSIVE METABOLIC PANEL - Abnormal; Notable for the following components:      Result Value   Glucose, Bld 139 (*)    BUN 29 (*)    Creatinine, Ser 2.02 (*)    Alkaline  Phosphatase 128 (*)    Total Bilirubin 2.7 (*)    GFR, Estimated 35 (*)    All other components within normal limits  CBC WITH DIFFERENTIAL/PLATELET - Abnormal; Notable for the following components:   RBC 6.12 (*)    MCH 25.7 (*)    RDW 20.8 (*)    Platelets 101 (*)    All other components within normal limits  BRAIN NATRIURETIC PEPTIDE - Abnormal; Notable for the following components:   B Natriuretic Peptide 700.9 (*)    All other components within normal limits  TROPONIN I (HIGH SENSITIVITY) - Abnormal; Notable for the following components:   Troponin I (High Sensitivity) 215 (*)    All other components within normal limits  TROPONIN I (HIGH SENSITIVITY) - Abnormal; Notable for the following components:   Troponin I (High Sensitivity) 186 (*)    All other components within normal limits    EKG EKG Interpretation  Date/Time:  Tuesday July 12 2021 20:58:53 EDT Ventricular Rate:  80 PR Interval:  286 QRS Duration: 196 QT Interval:  464 QTC Calculation: 535 R Axis:   193 Text Interpretation: Sinus rhythm with 1st degree A-V block Right bundle branch block Septal infarct , age undetermined Abnormal ECG Confirmed by Lennice Sites [phone]) on 07/13/2021 7:19:43 AM  Radiology DG Chest 2 View  Result Date: 07/12/2021 CLINICAL DATA:  Shortness of breath EXAM: CHEST - 2 VIEW COMPARISON:  05/03/2021 FINDINGS: Heart is mildly enlarged. No confluent opacities or edema. Small left pleural effusion. No acute bony abnormality. IMPRESSION: Cardiomegaly.  Small left pleural effusion. Electronically Signed  By: Rolm Baptise M.D.   On: 07/12/2021 22:53    Procedures Procedures   Medications Ordered in ED Medications  apixaban (ELIQUIS) tablet 5 mg (has no administration in time range)  furosemide (LASIX) tablet 40 mg (has no administration in time range)  potassium chloride SA (KLOR-CON) CR tablet 20 mEq (has no administration in time range)    ED Course  I have reviewed the triage  vital signs and the nursing notes.  Pertinent labs & imaging results that were available during my care of the patient were reviewed by me and considered in my medical decision making (see chart for details).  Clinical Course as of 07/13/21 1101  Wed Jul 13, 2021  1020 I communicated with Dr. Haroldine [name] by secure chat.  He agrees to outpatient follow-up in the heart failure clinic, and will have the staff contact the patient to schedule the appointment. [EW]    Clinical Course User Index [EW] Daleen Bo, MD   MDM Rules/Calculators/A&P                            Patient Vitals for the past 24 hrs:  BP Temp Temp src Pulse Resp SpO2 Height Weight  07/13/21 1045 121/82 -- -- 89 (!) 22 99 % -- --  07/13/21 1030 (!) 126/92 -- -- 93 20 98 % -- --  07/13/21 1015 139/87 -- -- 95 17 100 % -- --  07/13/21 0914 113/76 98.6 F (37 C) Oral 92 18 97 % -- --  07/13/21 0622 124/75 -- -- 93 20 98 % -- --  07/13/21 0320 (!) 141/95 -- -- 93 18 97 % -- --  07/12/21 2358 (!) 145/103 -- -- 84 18 100 % -- --  07/12/21 2058 (!) 141/94 98.1 F (36.7 C) Oral 77 20 98 % -- --  07/12/21 2055 -- -- -- -- -- -- '6\' 1"'$  (1.854 m) 101 kg  07/12/21 2051 -- -- -- -- -- 98 % -- --    10:55 AM Reevaluation with update and discussion. After initial assessment and treatment, an updated evaluation reveals no change in clinical status, findings discussed with the patient all questions were answered. Daleen Bo   Medical Decision Making:  This patient is presenting for evaluation of fatigue after being more active than usual, which does require a range of treatment options, and is a complaint that involves a moderate risk of morbidity and mortality. The differential diagnoses include heart failure, metabolic disorder, occult illness. I decided to review old records, and in summary elderly male with recent cardiac evaluation found to be stable but required medical management, who exercises regularly, and was somewhat  more active yesterday than usual..  I did not require additional historical information from anyone.  Clinical Laboratory Tests Ordered, included CBC, Metabolic panel, and BMP, troponin . Review indicates Stable chronic findings including elevated troponin. Radiologic Tests Ordered, included chest x-ray.  I independently Visualized: Radiograph images, which show no infiltrate or edema  ED course-clinical evaluation, laboratory testing, radiography, cardiac monitor, EKG, observation and reassessment.  Communication with his heart failure doctor.  MDM-stable chronic findings, without signs or symptoms of acute infection, metabolic problem or suggestion for impending vascular collapse.  Outpatient management indicated.  CRITICAL CARE-no Performed by: Daleen Bo  Nursing Notes Reviewed/ Care Coordinated Applicable Imaging Reviewed Interpretation of Laboratory Data incorporated into ED treatment  The patient appears reasonably screened and/or stabilized for discharge and I doubt any other  medical condition or other [facility] requiring further screening, evaluation, or treatment in the ED at this time prior to discharge.  Plan: Home Medications-continue usual; Home Treatments-regular activity; return here if the recommended treatment, does not improve the symptoms; Recommended follow up-PCP as needed.  Heart failure clinic for reassessment as soon as possible.     Final Clinical Impression(s) / ED Diagnoses Final diagnoses:  Fatigue, unspecified type  Chronic combined systolic and diastolic congestive heart failure ([place])    Rx / DC Orders ED Discharge Orders     None        Daleen Bo, MD 07/13/21 1101

## 2021-07-13 NOTE — ED Notes (Signed)
Pt verbalizes understanding of discharge instructions. Opportunity for questions and answers were provided. Pt discharged from the ED.   ?

## 2021-07-13 NOTE — Discharge Instructions (Addendum)
The testing today is reassuring.  Continue taking your usual medications.  The heart failure clinic staff will contact you to schedule a follow-up appointment.

## 2021-07-17 ENCOUNTER — Emergency Department (HOSPITAL_COMMUNITY): Payer: Medicare Other

## 2021-07-17 ENCOUNTER — Other Ambulatory Visit: Payer: Self-pay

## 2021-07-17 ENCOUNTER — Encounter (HOSPITAL_COMMUNITY): Payer: Self-pay | Admitting: Emergency Medicine

## 2021-07-17 ENCOUNTER — Inpatient Hospital Stay (HOSPITAL_COMMUNITY)
Admission: EM | Admit: 2021-07-17 | Discharge: 2021-08-09 | DRG: 444 | Disposition: A | Discharge status: Skilled Nursing Facility | Payer: Medicare Other | Attending: Internal Medicine | Admitting: Internal Medicine

## 2021-07-17 DIAGNOSIS — E871 Hypo-osmolality and hyponatremia: Secondary | ICD-10-CM | POA: Diagnosis present

## 2021-07-17 DIAGNOSIS — F32A Depression, unspecified: Secondary | ICD-10-CM | POA: Diagnosis not present

## 2021-07-17 DIAGNOSIS — E119 Type 2 diabetes mellitus without complications: Secondary | ICD-10-CM

## 2021-07-17 DIAGNOSIS — I480 Paroxysmal atrial fibrillation: Secondary | ICD-10-CM | POA: Diagnosis not present

## 2021-07-17 DIAGNOSIS — K828 Other specified diseases of gallbladder: Secondary | ICD-10-CM | POA: Diagnosis not present

## 2021-07-17 DIAGNOSIS — R319 Hematuria, unspecified: Secondary | ICD-10-CM

## 2021-07-17 DIAGNOSIS — I428 Other cardiomyopathies: Secondary | ICD-10-CM | POA: Diagnosis present

## 2021-07-17 DIAGNOSIS — N200 Calculus of kidney: Secondary | ICD-10-CM | POA: Diagnosis not present

## 2021-07-17 DIAGNOSIS — I4510 Unspecified right bundle-branch block: Secondary | ICD-10-CM | POA: Diagnosis present

## 2021-07-17 DIAGNOSIS — M109 Gout, unspecified: Secondary | ICD-10-CM | POA: Diagnosis present

## 2021-07-17 DIAGNOSIS — I482 Chronic atrial fibrillation, unspecified: Secondary | ICD-10-CM | POA: Diagnosis present

## 2021-07-17 DIAGNOSIS — E782 Mixed hyperlipidemia: Secondary | ICD-10-CM | POA: Diagnosis present

## 2021-07-17 DIAGNOSIS — M170 Bilateral primary osteoarthritis of knee: Secondary | ICD-10-CM | POA: Diagnosis not present

## 2021-07-17 DIAGNOSIS — Z794 Long term (current) use of insulin: Secondary | ICD-10-CM

## 2021-07-17 DIAGNOSIS — G9349 Other encephalopathy: Secondary | ICD-10-CM | POA: Diagnosis not present

## 2021-07-17 DIAGNOSIS — K8020 Calculus of gallbladder without cholecystitis without obstruction: Secondary | ICD-10-CM

## 2021-07-17 DIAGNOSIS — I5042 Chronic combined systolic (congestive) and diastolic (congestive) heart failure: Secondary | ICD-10-CM | POA: Diagnosis not present

## 2021-07-17 DIAGNOSIS — Z8249 Family history of ischemic heart disease and other diseases of the circulatory system: Secondary | ICD-10-CM

## 2021-07-17 DIAGNOSIS — I4892 Unspecified atrial flutter: Secondary | ICD-10-CM | POA: Diagnosis not present

## 2021-07-17 DIAGNOSIS — K851 Biliary acute pancreatitis without necrosis or infection: Secondary | ICD-10-CM | POA: Diagnosis not present

## 2021-07-17 DIAGNOSIS — Z86010 Personal history of colonic polyps: Secondary | ICD-10-CM

## 2021-07-17 DIAGNOSIS — N179 Acute kidney failure, unspecified: Secondary | ICD-10-CM | POA: Diagnosis not present

## 2021-07-17 DIAGNOSIS — J9811 Atelectasis: Secondary | ICD-10-CM | POA: Diagnosis not present

## 2021-07-17 DIAGNOSIS — N281 Cyst of kidney, acquired: Secondary | ICD-10-CM | POA: Diagnosis not present

## 2021-07-17 DIAGNOSIS — R578 Other shock: Secondary | ICD-10-CM | POA: Diagnosis not present

## 2021-07-17 DIAGNOSIS — I952 Hypotension due to drugs: Secondary | ICD-10-CM | POA: Diagnosis not present

## 2021-07-17 DIAGNOSIS — R109 Unspecified abdominal pain: Secondary | ICD-10-CM

## 2021-07-17 DIAGNOSIS — G319 Degenerative disease of nervous system, unspecified: Secondary | ICD-10-CM | POA: Diagnosis not present

## 2021-07-17 DIAGNOSIS — D696 Thrombocytopenia, unspecified: Secondary | ICD-10-CM | POA: Diagnosis not present

## 2021-07-17 DIAGNOSIS — Z66 Do not resuscitate: Secondary | ICD-10-CM | POA: Diagnosis not present

## 2021-07-17 DIAGNOSIS — I484 Atypical atrial flutter: Secondary | ICD-10-CM | POA: Diagnosis not present

## 2021-07-17 DIAGNOSIS — R0603 Acute respiratory distress: Secondary | ICD-10-CM

## 2021-07-17 DIAGNOSIS — N529 Male erectile dysfunction, unspecified: Secondary | ICD-10-CM | POA: Diagnosis present

## 2021-07-17 DIAGNOSIS — R531 Weakness: Secondary | ICD-10-CM | POA: Diagnosis not present

## 2021-07-17 DIAGNOSIS — K8050 Calculus of bile duct without cholangitis or cholecystitis without obstruction: Secondary | ICD-10-CM

## 2021-07-17 DIAGNOSIS — Z20822 Contact with and (suspected) exposure to covid-19: Secondary | ICD-10-CM | POA: Diagnosis present

## 2021-07-17 DIAGNOSIS — R52 Pain, unspecified: Secondary | ICD-10-CM

## 2021-07-17 DIAGNOSIS — J90 Pleural effusion, not elsewhere classified: Secondary | ICD-10-CM

## 2021-07-17 DIAGNOSIS — E1122 Type 2 diabetes mellitus with diabetic chronic kidney disease: Secondary | ICD-10-CM | POA: Diagnosis not present

## 2021-07-17 DIAGNOSIS — K8080 Other cholelithiasis without obstruction: Secondary | ICD-10-CM | POA: Diagnosis not present

## 2021-07-17 DIAGNOSIS — Z743 Need for continuous supervision: Secondary | ICD-10-CM | POA: Diagnosis not present

## 2021-07-17 DIAGNOSIS — R3129 Other microscopic hematuria: Secondary | ICD-10-CM | POA: Diagnosis not present

## 2021-07-17 DIAGNOSIS — R748 Abnormal levels of other serum enzymes: Secondary | ICD-10-CM | POA: Diagnosis not present

## 2021-07-17 DIAGNOSIS — Z7189 Other specified counseling: Secondary | ICD-10-CM | POA: Diagnosis not present

## 2021-07-17 DIAGNOSIS — I252 Old myocardial infarction: Secondary | ICD-10-CM | POA: Diagnosis not present

## 2021-07-17 DIAGNOSIS — E11649 Type 2 diabetes mellitus with hypoglycemia without coma: Secondary | ICD-10-CM | POA: Diagnosis present

## 2021-07-17 DIAGNOSIS — A0472 Enterocolitis due to Clostridium difficile, not specified as recurrent: Secondary | ICD-10-CM | POA: Diagnosis not present

## 2021-07-17 DIAGNOSIS — Z888 Allergy status to other drugs, medicaments and biological substances status: Secondary | ICD-10-CM

## 2021-07-17 DIAGNOSIS — I2510 Atherosclerotic heart disease of native coronary artery without angina pectoris: Secondary | ICD-10-CM | POA: Diagnosis not present

## 2021-07-17 DIAGNOSIS — Z6828 Body mass index (BMI) 28.0-28.9, adult: Secondary | ICD-10-CM

## 2021-07-17 DIAGNOSIS — Z515 Encounter for palliative care: Secondary | ICD-10-CM | POA: Diagnosis not present

## 2021-07-17 DIAGNOSIS — N1832 Chronic kidney disease, stage 3b: Secondary | ICD-10-CM | POA: Diagnosis present

## 2021-07-17 DIAGNOSIS — R9431 Abnormal electrocardiogram [ECG] [EKG]: Secondary | ICD-10-CM | POA: Diagnosis not present

## 2021-07-17 DIAGNOSIS — T4145XA Adverse effect of unspecified anesthetic, initial encounter: Secondary | ICD-10-CM | POA: Diagnosis not present

## 2021-07-17 DIAGNOSIS — Z96653 Presence of artificial knee joint, bilateral: Secondary | ICD-10-CM | POA: Diagnosis present

## 2021-07-17 DIAGNOSIS — K810 Acute cholecystitis: Secondary | ICD-10-CM

## 2021-07-17 DIAGNOSIS — K8062 Calculus of gallbladder and bile duct with acute cholecystitis without obstruction: Principal | ICD-10-CM | POA: Diagnosis present

## 2021-07-17 DIAGNOSIS — F419 Anxiety disorder, unspecified: Secondary | ICD-10-CM | POA: Diagnosis present

## 2021-07-17 DIAGNOSIS — I130 Hypertensive heart and chronic kidney disease with heart failure and stage 1 through stage 4 chronic kidney disease, or unspecified chronic kidney disease: Secondary | ICD-10-CM | POA: Diagnosis present

## 2021-07-17 DIAGNOSIS — I471 Supraventricular tachycardia: Secondary | ICD-10-CM | POA: Diagnosis not present

## 2021-07-17 DIAGNOSIS — I10 Essential (primary) hypertension: Secondary | ICD-10-CM | POA: Diagnosis present

## 2021-07-17 DIAGNOSIS — M6281 Muscle weakness (generalized): Secondary | ICD-10-CM | POA: Diagnosis not present

## 2021-07-17 DIAGNOSIS — R1010 Upper abdominal pain, unspecified: Secondary | ICD-10-CM

## 2021-07-17 DIAGNOSIS — M1990 Unspecified osteoarthritis, unspecified site: Secondary | ICD-10-CM | POA: Diagnosis not present

## 2021-07-17 DIAGNOSIS — K859 Acute pancreatitis without necrosis or infection, unspecified: Secondary | ICD-10-CM | POA: Diagnosis not present

## 2021-07-17 DIAGNOSIS — I5043 Acute on chronic combined systolic (congestive) and diastolic (congestive) heart failure: Secondary | ICD-10-CM | POA: Diagnosis not present

## 2021-07-17 DIAGNOSIS — Z87442 Personal history of urinary calculi: Secondary | ICD-10-CM

## 2021-07-17 DIAGNOSIS — I517 Cardiomegaly: Secondary | ICD-10-CM | POA: Diagnosis not present

## 2021-07-17 DIAGNOSIS — N183 Chronic kidney disease, stage 3 unspecified: Secondary | ICD-10-CM

## 2021-07-17 DIAGNOSIS — Z79899 Other long term (current) drug therapy: Secondary | ICD-10-CM

## 2021-07-17 DIAGNOSIS — D509 Iron deficiency anemia, unspecified: Secondary | ICD-10-CM | POA: Diagnosis not present

## 2021-07-17 DIAGNOSIS — R1312 Dysphagia, oropharyngeal phase: Secondary | ICD-10-CM | POA: Diagnosis not present

## 2021-07-17 DIAGNOSIS — Z7401 Bed confinement status: Secondary | ICD-10-CM | POA: Diagnosis not present

## 2021-07-17 DIAGNOSIS — K269 Duodenal ulcer, unspecified as acute or chronic, without hemorrhage or perforation: Secondary | ICD-10-CM | POA: Diagnosis not present

## 2021-07-17 DIAGNOSIS — R2689 Other abnormalities of gait and mobility: Secondary | ICD-10-CM | POA: Diagnosis not present

## 2021-07-17 DIAGNOSIS — R933 Abnormal findings on diagnostic imaging of other parts of digestive tract: Secondary | ICD-10-CM | POA: Diagnosis not present

## 2021-07-17 DIAGNOSIS — K5900 Constipation, unspecified: Secondary | ICD-10-CM | POA: Diagnosis present

## 2021-07-17 DIAGNOSIS — Z01810 Encounter for preprocedural cardiovascular examination: Secondary | ICD-10-CM | POA: Diagnosis not present

## 2021-07-17 DIAGNOSIS — D751 Secondary polycythemia: Secondary | ICD-10-CM | POA: Diagnosis present

## 2021-07-17 DIAGNOSIS — Z7901 Long term (current) use of anticoagulants: Secondary | ICD-10-CM

## 2021-07-17 DIAGNOSIS — D72829 Elevated white blood cell count, unspecified: Secondary | ICD-10-CM

## 2021-07-17 DIAGNOSIS — Y92239 Unspecified place in hospital as the place of occurrence of the external cause: Secondary | ICD-10-CM | POA: Diagnosis not present

## 2021-07-17 DIAGNOSIS — I700 Atherosclerosis of aorta: Secondary | ICD-10-CM | POA: Diagnosis not present

## 2021-07-17 DIAGNOSIS — M2681 Anterior soft tissue impingement: Secondary | ICD-10-CM | POA: Diagnosis not present

## 2021-07-17 DIAGNOSIS — Z833 Family history of diabetes mellitus: Secondary | ICD-10-CM

## 2021-07-17 DIAGNOSIS — I358 Other nonrheumatic aortic valve disorders: Secondary | ICD-10-CM | POA: Diagnosis present

## 2021-07-17 DIAGNOSIS — I440 Atrioventricular block, first degree: Secondary | ICD-10-CM | POA: Diagnosis present

## 2021-07-17 DIAGNOSIS — K8042 Calculus of bile duct with acute cholecystitis without obstruction: Secondary | ICD-10-CM | POA: Diagnosis not present

## 2021-07-17 DIAGNOSIS — R1011 Right upper quadrant pain: Secondary | ICD-10-CM | POA: Diagnosis not present

## 2021-07-17 DIAGNOSIS — R627 Adult failure to thrive: Secondary | ICD-10-CM | POA: Diagnosis not present

## 2021-07-17 DIAGNOSIS — I5022 Chronic systolic (congestive) heart failure: Secondary | ICD-10-CM

## 2021-07-17 DIAGNOSIS — Z87891 Personal history of nicotine dependence: Secondary | ICD-10-CM

## 2021-07-17 DIAGNOSIS — M47816 Spondylosis without myelopathy or radiculopathy, lumbar region: Secondary | ICD-10-CM | POA: Diagnosis not present

## 2021-07-17 DIAGNOSIS — E861 Hypovolemia: Secondary | ICD-10-CM | POA: Diagnosis not present

## 2021-07-17 DIAGNOSIS — R932 Abnormal findings on diagnostic imaging of liver and biliary tract: Secondary | ICD-10-CM | POA: Diagnosis not present

## 2021-07-17 DIAGNOSIS — K838 Other specified diseases of biliary tract: Secondary | ICD-10-CM | POA: Diagnosis not present

## 2021-07-17 DIAGNOSIS — Z955 Presence of coronary angioplasty implant and graft: Secondary | ICD-10-CM

## 2021-07-17 LAB — CBC WITH DIFFERENTIAL/PLATELET
Abs Immature Granulocytes: 0.1 10*3/uL — ABNORMAL HIGH (ref 0.00–0.07)
Basophils Absolute: 0 10*3/uL (ref 0.0–0.1)
Basophils Relative: 0 %
Eosinophils Absolute: 0 10*3/uL (ref 0.0–0.5)
Eosinophils Relative: 0 %
HCT: 53.2 % — ABNORMAL HIGH (ref 39.0–52.0)
Hemoglobin: 16.8 g/dL (ref 13.0–17.0)
Immature Granulocytes: 1 %
Lymphocytes Relative: 3 %
Lymphs Abs: 0.4 10*3/uL — ABNORMAL LOW (ref 0.7–4.0)
MCH: 26 pg (ref 26.0–34.0)
MCHC: 31.6 g/dL (ref 30.0–36.0)
MCV: 82.2 fL (ref 80.0–100.0)
Monocytes Absolute: 1.4 10*3/uL — ABNORMAL HIGH (ref 0.1–1.0)
Monocytes Relative: 8 %
Neutro Abs: 14.5 10*3/uL — ABNORMAL HIGH (ref 1.7–7.7)
Neutrophils Relative %: 88 %
Platelets: 88 10*3/uL — ABNORMAL LOW (ref 150–400)
RBC: 6.47 MIL/uL — ABNORMAL HIGH (ref 4.22–5.81)
RDW: 21.2 % — ABNORMAL HIGH (ref 11.5–15.5)
WBC: 16.5 10*3/uL — ABNORMAL HIGH (ref 4.0–10.5)
nRBC: 0 % (ref 0.0–0.2)

## 2021-07-17 LAB — COMPREHENSIVE METABOLIC PANEL
ALT: 23 U/L (ref 0–44)
AST: 51 U/L — ABNORMAL HIGH (ref 15–41)
Albumin: 3.3 g/dL — ABNORMAL LOW (ref 3.5–5.0)
Alkaline Phosphatase: 171 U/L — ABNORMAL HIGH (ref 38–126)
Anion gap: 12 (ref 5–15)
BUN: 44 mg/dL — ABNORMAL HIGH (ref 8–23)
CO2: 25 mmol/L (ref 22–32)
Calcium: 9.4 mg/dL (ref 8.9–10.3)
Chloride: 96 mmol/L — ABNORMAL LOW (ref 98–111)
Creatinine, Ser: 2 mg/dL — ABNORMAL HIGH (ref 0.61–1.24)
GFR, Estimated: 36 mL/min — ABNORMAL LOW (ref >60)
Glucose, Bld: 128 mg/dL — ABNORMAL HIGH (ref 70–99)
Potassium: 4.2 mmol/L (ref 3.5–5.1)
Sodium: 133 mmol/L — ABNORMAL LOW (ref 135–145)
Total Bilirubin: 4.1 mg/dL — ABNORMAL HIGH (ref 0.3–1.2)
Total Protein: 7 g/dL (ref 6.5–8.1)

## 2021-07-17 LAB — URINALYSIS, ROUTINE W REFLEX MICROSCOPIC
Bilirubin Urine: NEGATIVE
Glucose, UA: >=500 mg/dL — AB
Ketones, ur: NEGATIVE mg/dL
Leukocytes,Ua: NEGATIVE
Nitrite: NEGATIVE
Protein, ur: 100 mg/dL — AB
RBC / HPF: >50 RBC/hpf — ABNORMAL HIGH (ref 0–5)
Specific Gravity, Urine: 1.016 (ref 1.005–1.030)
pH: 5 (ref 5.0–8.0)

## 2021-07-17 LAB — LIPASE, BLOOD: Lipase: 105 U/L — ABNORMAL HIGH (ref 11–51)

## 2021-07-17 LAB — RESP PANEL BY RT-PCR (FLU A&B, COVID) ARPGX2
Influenza A by PCR: NEGATIVE
Influenza B by PCR: NEGATIVE
SARS Coronavirus 2 by RT PCR: NEGATIVE

## 2021-07-17 NOTE — ED Triage Notes (Signed)
Arrives via EMS from home, complains of abd pain x3 days, RLQ, has pervious diarrhea. Decreased appetite the past few days. Denies N/V.

## 2021-07-17 NOTE — ED Provider Notes (Signed)
Emergency Medicine Provider Triage Evaluation Note  [name] [name] , a 70 y.o. male  was evaluated in triage.  Pt complains of abdominal pain, diarrhea with decreased appetite.  He states [facility].  He was not having abdominal pain then.  He states that his abdomen started hurting three days ago, is right sided with diarrhea.    Review of Systems  Positive: Abdominal pain, diarrhea Negative: fevers  Physical Exam  BP (!) 140/96 (BP Location: Right Arm)   Pulse 92   Temp 98.4 F (36.9 C) (Oral)   Resp 16   SpO2 97%  Gen:   Awake, no distress   Resp:  Normal effort  MSK:   Moves extremities without difficulty  Other:  Patient is awake and alert, answers questions with out difficulty.   Medical Decision Making  Medically screening exam initiated at 6:48 PM.  Appropriate orders placed.  [name] [name] was informed that the remainder of the evaluation will be completed by another provider, this initial triage assessment does not replace that evaluation, and the importance of remaining in the ED until their evaluation is complete.    [name], PA-C 07/17/21 [place], [place], DO 07/18/21 [phone]

## 2021-07-17 NOTE — ED Provider Notes (Signed)
[facility] DEPT Provider Note   CSN: [id number] Arrival date & time: 07/17/21  1809     History Chief Complaint  Patient presents with   Abdominal Pain    [name] [name] is a 70 y.o. male.  70 yo male with history as below to ED for RLQ pain. Ongoing over the past 3 days. Abdominal surgery includes hernia repair. Patient also with intermittent diarrhea over the last few days. No melena, no blood per rectum. Mild nausea without emesis. No fevers or chills. No recent change to oral intake, no suspicious oral intake or sick contacts. HE has not experienced this pain in the past. NO chest pain or dyspnea. He has some mild discomfort with urination. Pain located to RLQ and radiates to his right flank. No back pain. No falls. He is ambulatory. No other acute complaints offered.   The history is provided by the patient. No language interpreter was used.  Abdominal Pain Associated symptoms: diarrhea and dysuria   Associated symptoms: no chest pain, no chills, no cough, no fever, no hematuria, no nausea, no shortness of breath and no vomiting       Past Medical History:  Diagnosis Date   Acute on chronic congestive heart failure (HCC)    Acute on chronic systolic CHF (congestive heart failure) ([place]) 05/04/2021   Arthritis of left knee 12/02/2020   Bilateral leg edema 05/04/2017   CAD (coronary artery disease)    nonST elevated MI in Oct 2008 treated w/2 drug -eluting stents to the RCA and  mid LAD, EF 55%   CAD (coronary artery disease), native coronary artery 11/12/2018   Formatting of this note might be different from the original. S/p MI - Dr. 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 Hypertension  10/1 IMO update   Gout, unspecified 10/14/2007   Overview:  Gout  10/1 IMO update  Formatting of this note might be different from the original. Gout  10/1 IMO update   History of colonic polyps 11/12/2018   Overview:  rpt colonoscopy 5/17; Dr. Haig Prophet of this note might be different from the original. rpt colonoscopy 5/17; Dr. Erlene Quan   History of kidney stones    History of nephrolithiasis 11/12/2018   History of smoking 11/12/2018   Overview:  quit 2000  Formatting of this note might be different from the original. quit 2000   HTN (hypertension)    Hyperlipidemia    HYPERTENSION, BENIGN 06/15/2009   Inguinal hernia without mention of obstruction or gangrene, recurrent unilateral or unspecified 11/12/2018   Ischemic cardiomyopathy 08/12/2020   Medication intolerance 11/12/2018   Mixed hyperlipidemia 11/12/2018   Old myocardial infarction 11/12/2018   Paroxysmal atrial fibrillation ([place]) 07/16/2020   Pre-operative cardiovascular examination 11/12/2018   Primary osteoarthritis of both knees 03/05/2017   Considering Zilretta injections. Good improvement with corticosteroid injections however short-lived. Mild improvement with Visco supplementation.   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   History of colonic polyps 11/12/2018   History of nephrolithiasis 11/12/2018   History of smoking 11/12/2018   Inguinal hernia without mention of obstruction or gangrene, recurrent unilateral or unspecified 11/12/2018   Medication intolerance 11/12/2018   Old myocardial infarction 11/12/2018   Mixed hyperlipidemia 11/12/2018   Pre-operative cardiovascular examination 11/12/2018   CAD (coronary artery disease), native coronary artery 11/12/2018   Bilateral leg edema 05/04/2017   Primary osteoarthritis of both knees 03/05/2017   CAP (community acquired pneumonia) 08/17/2016   Right carotid bruit 08/05/2014   HYPERTENSION, BENIGN 06/15/2009   CAD, NATIVE VESSEL 06/15/2009   Type II or unspecified type diabetes mellitus without mention of complication, not stated as uncontrolled 10/14/2007   Gout, unspecified 10/14/2007   Essential hypertension 10/14/2007    Past Surgical History:  Procedure Laterality Date   BILATERAL KNEE ARTHROSCOPY     CORONARY ANGIOPLASTY WITH STENT PLACEMENT     LAPAROSCOPIC INGUINAL HERNIA REPAIR     LEFT HEART CATH AND CORONARY ANGIOGRAPHY N/A 05/05/2021   Procedure: LEFT HEART CATH AND CORONARY ANGIOGRAPHY;   Surgeon: Troy Sine, MD;  Location: [facility];  Service: Cardiovascular;  Laterality: N/A;   NOSE SURGERY     TOTAL KNEE ARTHROPLASTY Right 11/15/2018   Procedure: RIGHT TOTAL KNEE ARTHROPLASTY;  Surgeon: Mcarthur Rossetti, MD;  Location: [facility];  Service: Orthopedics;  Laterality: Right;   TOTAL KNEE ARTHROPLASTY Left 12/03/2020   Procedure: LEFT TOTAL KNEE ARTHROPLASTY;  Surgeon: Mcarthur Rossetti, MD;  Location: [facility];  Service: Orthopedics;  Laterality: Left;       Family History  Problem Relation Age of Onset   Hypertension Mother    Cancer Mother    Heart attack Father    Hypertension Father    Diabetes Father    Hypertension Brother    Irregular heart beat Brother     Social History   Tobacco Use   Smoking status: Former    Packs/day: 0.50    Years: 10.00    Pack years: 5.00    Types: Cigarettes   Smokeless tobacco: Former    Types: Snuff, Chew    Quit date: 11/20/1968   Tobacco comments:    in his 20's  Vaping Use   Vaping Use: Never used  Substance Use Topics   Alcohol use: No   Drug use: No  Home Medications Prior to Admission medications   Medication Sig Start Date End Date Taking? Authorizing Provider  albuterol (VENTOLIN HFA) 108 (90 Base) MCG/ACT inhaler Inhale 2 puffs into the lungs every 6 (six) hours as needed for shortness of breath. 05/03/21   [provider]  ALPRAZolam Duanne Moron) 0.25 MG tablet Take 0.25 mg by mouth at bedtime as needed for anxiety.  11/27/16   [provider]  apixaban (ELIQUIS) 5 MG TABS tablet Take 1 tablet (5 mg total) by mouth 2 (two) times daily. 05/06/21 07/04/21  Elodia Florence., MD  CIALIS 20 MG tablet Take 20 mg by mouth daily as needed for erectile dysfunction.  02/06/17   [provider]  colchicine 0.6 MG tablet Take 0.6 mg by mouth daily as needed for other. Gout flare up 05/02/21   [provider]  cyanocobalamin (,VITAMIN B-12,) 1000 MCG/ML injection Inject  1,000 mcg into the muscle every 30 (thirty) days.  05/12/16   [provider]  dapagliflozin propanediol (FARXIGA) 10 MG TABS tablet Take 1 tablet (10 mg total) by mouth at bedtime. 05/11/21   Katherine Roan, MD  fluticasone (FLONASE) 50 MCG/ACT nasal spray Place 1 spray into both nostrils daily as needed for allergies or rhinitis.    [provider]  furosemide (LASIX) 40 MG tablet Take 1 tablet (40 mg total) by mouth daily. 05/30/21   Katherine Roan, MD  LANTUS SOLOSTAR 100 UNIT/ML Solostar Pen Inject 21 Units into the skin at bedtime. 03/14/20   [provider]  levalbuterol (XOPENEX HFA) 45 MCG/ACT inhaler Inhale 1 puff into the lungs every 4 (four) hours as needed for wheezing.    [provider]  potassium chloride SA (KLOR-CON) 20 MEQ tablet Take 1 tablet (20 mEq total) by mouth daily. 05/30/21   Katherine Roan, MD  rosuvastatin (CRESTOR) 40 MG tablet Take 1 tablet (40 mg total) by mouth at bedtime. 05/30/21   Katherine Roan, MD  spironolactone (ALDACTONE) 25 MG tablet Take 1 tablet (25 mg total) by mouth at bedtime. 07/04/21   Bensimhon, Shaune Pascal, MD  traMADol (ULTRAM) 50 MG tablet Take 25 mg by mouth every 4 (four) hours as needed for pain or severe pain. 11/13/20   [provider]    Allergies    Allopurinol, Aripiprazole, Bupropion, Buspirone, Desvenlafaxine, Duloxetine, Escitalopram, Fish oil, Fluticasone furoate, Hydroxyzine hcl, Nortriptyline, Paroxetine hcl, Ramipril, Sertraline, Venlafaxine, and Vilazodone  Review of Systems   Review of Systems  Constitutional:  Negative for chills and fever.  HENT:  Negative for facial swelling and trouble swallowing.   Eyes:  Negative for photophobia and visual disturbance.  Respiratory:  Negative for cough and shortness of breath.   Cardiovascular:  Negative for chest pain and palpitations.  Gastrointestinal:  Positive for abdominal pain and diarrhea. Negative for nausea and vomiting.   Endocrine: Negative for polydipsia and polyuria.  Genitourinary:  Positive for dysuria. Negative for difficulty urinating and hematuria.  Musculoskeletal:  Negative for gait problem and joint swelling.  Skin:  Negative for pallor and rash.  Neurological:  Negative for syncope and headaches.  Psychiatric/Behavioral:  Negative for agitation and confusion.    Physical Exam Updated Vital Signs BP 124/89   Pulse 92   Temp 98.4 F (36.9 C) (Oral)   Resp 18   SpO2 95%   Physical Exam Vitals and nursing note reviewed.  Constitutional:      General: He is not in acute distress.    Appearance:  He is well-developed.  HENT:     Head: Normocephalic and atraumatic.     Right Ear: External ear normal.     Left Ear: External ear normal.     Mouth/Throat:     Mouth: Mucous membranes are moist.  Eyes:     General: No scleral icterus. Cardiovascular:     Rate and Rhythm: Normal rate and regular rhythm.     Pulses: Normal pulses.     Heart sounds: Normal heart sounds.  Pulmonary:     Effort: Pulmonary effort is normal. No respiratory distress.     Breath sounds: Normal breath sounds.  Abdominal:     General: Abdomen is flat.     Palpations: Abdomen is soft.     Tenderness: There is abdominal tenderness in the right upper quadrant and right lower quadrant.     Comments: Right flank pain. Non peritoneal abdomen.   Musculoskeletal:        General: Normal range of motion.     Cervical back: Normal range of motion.     Right lower leg: No edema.     Left lower leg: No edema.  Skin:    General: Skin is warm and dry.     Capillary Refill: Capillary refill takes less than 2 seconds.  Neurological:     Mental Status: He is alert and oriented to person, place, and time.  Psychiatric:        Mood and Affect: Mood normal.        Behavior: Behavior normal.    ED Results / Procedures / Treatments   Labs (all labs ordered are listed, but only abnormal results are displayed) Labs Reviewed   COMPREHENSIVE METABOLIC PANEL - Abnormal; Notable for the following components:      Result Value   Sodium 133 (*)    Chloride 96 (*)    Glucose, Bld 128 (*)    BUN 44 (*)    Creatinine, Ser 2.00 (*)    Albumin 3.3 (*)    AST 51 (*)    Alkaline Phosphatase 171 (*)    Total Bilirubin 4.1 (*)    GFR, Estimated 36 (*)    All other components within normal limits  CBC WITH DIFFERENTIAL/PLATELET - Abnormal; Notable for the following components:   WBC 16.5 (*)    RBC 6.47 (*)    HCT 53.2 (*)    RDW 21.2 (*)    Platelets 88 (*)    Neutro Abs 14.5 (*)    Lymphs Abs 0.4 (*)    Monocytes Absolute 1.4 (*)    Abs Immature Granulocytes 0.10 (*)    All other components within normal limits  LIPASE, BLOOD - Abnormal; Notable for the following components:   Lipase 105 (*)    All other components within normal limits  URINALYSIS, ROUTINE W REFLEX MICROSCOPIC - Abnormal; Notable for the following components:   Color, Urine AMBER (*)    APPearance CLOUDY (*)    Glucose, UA >=500 (*)    Hgb urine dipstick MODERATE (*)    Protein, ur 100 (*)    RBC / HPF >50 (*)    Bacteria, UA RARE (*)    All other components within normal limits  RESP PANEL BY RT-PCR (FLU A&B, COVID) [id number]  URINE CULTURE    EKG EKG Interpretation  Date/Time:  Sunday July 17 2021 20:59:52 EDT Ventricular Rate:  94 PR Interval:    QRS Duration: 210 QT Interval:  467 QTC Calculation: 585 R  Axis:   167 Text Interpretation: Accelerated junctional rhythm Right bundle branch block similar to prior tracing Confirmed by Wynona Dove [phone]) on 07/17/2021 10:49:39 PM  Radiology US Abdomen Limited RUQ (LIVER/GB)  Result Date: 07/17/2021 CLINICAL DATA:  Right upper quadrant pain for 4 days. EXAM: ULTRASOUND ABDOMEN LIMITED RIGHT UPPER QUADRANT COMPARISON:  None. FINDINGS: Gallbladder: The gallbladder wall is thickened and mildly irregular. Sludge is identified. At least one 6 mm stone is noted. Pericholecystic fluid is  identified. No Murphy's sign reported. Common bile duct: Diameter: 3 mm Liver: No focal lesion identified. Within normal limits in parenchymal echogenicity. Portal vein is patent on color Doppler imaging with normal direction of blood flow towards the liver. Other: None. IMPRESSION: 1. The gallbladder is abnormal in appearance with a mildly irregular thickened wall. There is sludge in the gallbladder as well as at least 1 stone. Pericholecystic fluid is noted. No Murphy's sign. If the clinical picture remains ambiguous, recommend a HIDA scan. 2. A small amount of free fluid is seen in the abdomen. 3. No other acute abnormalities. Electronically Signed   By: Dorise Bullion III M.D.   On: 07/17/2021 21:01    Procedures Procedures   Medications Ordered in ED Medications - No data to display  ED Course  I have reviewed the triage vital signs and the nursing notes.  Pertinent labs & imaging results that were available during my care of the patient were reviewed by me and considered in my medical decision making (see chart for details).    MDM Rules/Calculators/A&P                           This patient complains of abdominal pain, diarrhea, urinary discomfort; this involves an extensive number of treatment Options and is a complaint that carries with it a high risk of complications and Morbidity. Serious etiologies considered.   Labs reviewed: CBC with leukocytosis 16.5, bandemia. He has thrombocytopenia which is chronic. No increased bruising or bleeding.   His alk phos is elevated at 171, t-bili increased to 4.1 Mild increase to AST at 51. RUQ [place] with abnormal gallbladder, sludge/stone, pericholecystic fluid. Recommend general surgery evaluation for possible cholecystitis.   Mildly elevated lipase, doubt acute pancreatitis at this time.   Given hematuria, obtain CT abdomen pelvis, no contrast as renal insufficiency. Concern for possible kidney stone.  RLQ abnormality.  At this time  patient signed out to the incoming physician pending final disposition and imaging.   Final Clinical Impression(s) / ED Diagnoses Final diagnoses:  Abdominal pain  Hematuria, unspecified type  Biliary calculus of other site without obstruction    Rx / DC Orders ED Discharge Orders     None        Jeanell Sparrow, DO 07/17/21 2358

## 2021-07-18 ENCOUNTER — Encounter (HOSPITAL_COMMUNITY): Payer: Self-pay | Admitting: Family Medicine

## 2021-07-18 ENCOUNTER — Inpatient Hospital Stay (HOSPITAL_COMMUNITY): Payer: Medicare Other

## 2021-07-18 DIAGNOSIS — R531 Weakness: Secondary | ICD-10-CM | POA: Diagnosis not present

## 2021-07-18 DIAGNOSIS — J9811 Atelectasis: Secondary | ICD-10-CM | POA: Diagnosis not present

## 2021-07-18 DIAGNOSIS — Z01810 Encounter for preprocedural cardiovascular examination: Secondary | ICD-10-CM

## 2021-07-18 DIAGNOSIS — R933 Abnormal findings on diagnostic imaging of other parts of digestive tract: Secondary | ICD-10-CM | POA: Diagnosis not present

## 2021-07-18 DIAGNOSIS — K828 Other specified diseases of gallbladder: Secondary | ICD-10-CM | POA: Diagnosis not present

## 2021-07-18 DIAGNOSIS — I4892 Unspecified atrial flutter: Secondary | ICD-10-CM | POA: Diagnosis not present

## 2021-07-18 DIAGNOSIS — N183 Chronic kidney disease, stage 3 unspecified: Secondary | ICD-10-CM

## 2021-07-18 DIAGNOSIS — K8062 Calculus of gallbladder and bile duct with acute cholecystitis without obstruction: Secondary | ICD-10-CM | POA: Diagnosis not present

## 2021-07-18 DIAGNOSIS — I4510 Unspecified right bundle-branch block: Secondary | ICD-10-CM | POA: Diagnosis present

## 2021-07-18 DIAGNOSIS — Z20822 Contact with and (suspected) exposure to covid-19: Secondary | ICD-10-CM | POA: Diagnosis not present

## 2021-07-18 DIAGNOSIS — A0472 Enterocolitis due to Clostridium difficile, not specified as recurrent: Secondary | ICD-10-CM | POA: Diagnosis not present

## 2021-07-18 DIAGNOSIS — K810 Acute cholecystitis: Secondary | ICD-10-CM | POA: Diagnosis present

## 2021-07-18 DIAGNOSIS — K8050 Calculus of bile duct without cholangitis or cholecystitis without obstruction: Secondary | ICD-10-CM

## 2021-07-18 DIAGNOSIS — E861 Hypovolemia: Secondary | ICD-10-CM | POA: Diagnosis not present

## 2021-07-18 DIAGNOSIS — K838 Other specified diseases of biliary tract: Secondary | ICD-10-CM | POA: Diagnosis not present

## 2021-07-18 DIAGNOSIS — D72829 Elevated white blood cell count, unspecified: Secondary | ICD-10-CM

## 2021-07-18 DIAGNOSIS — E119 Type 2 diabetes mellitus without complications: Secondary | ICD-10-CM | POA: Diagnosis not present

## 2021-07-18 DIAGNOSIS — E782 Mixed hyperlipidemia: Secondary | ICD-10-CM | POA: Diagnosis not present

## 2021-07-18 DIAGNOSIS — N1832 Chronic kidney disease, stage 3b: Secondary | ICD-10-CM | POA: Diagnosis present

## 2021-07-18 DIAGNOSIS — R109 Unspecified abdominal pain: Secondary | ICD-10-CM | POA: Diagnosis not present

## 2021-07-18 DIAGNOSIS — I517 Cardiomegaly: Secondary | ICD-10-CM | POA: Diagnosis not present

## 2021-07-18 DIAGNOSIS — I10 Essential (primary) hypertension: Secondary | ICD-10-CM | POA: Diagnosis not present

## 2021-07-18 DIAGNOSIS — I5022 Chronic systolic (congestive) heart failure: Secondary | ICD-10-CM

## 2021-07-18 DIAGNOSIS — Y92239 Unspecified place in hospital as the place of occurrence of the external cause: Secondary | ICD-10-CM | POA: Diagnosis not present

## 2021-07-18 DIAGNOSIS — E1122 Type 2 diabetes mellitus with diabetic chronic kidney disease: Secondary | ICD-10-CM | POA: Diagnosis present

## 2021-07-18 DIAGNOSIS — Z66 Do not resuscitate: Secondary | ICD-10-CM | POA: Diagnosis not present

## 2021-07-18 DIAGNOSIS — R932 Abnormal findings on diagnostic imaging of liver and biliary tract: Secondary | ICD-10-CM | POA: Diagnosis not present

## 2021-07-18 DIAGNOSIS — G9349 Other encephalopathy: Secondary | ICD-10-CM | POA: Diagnosis not present

## 2021-07-18 DIAGNOSIS — R319 Hematuria, unspecified: Secondary | ICD-10-CM | POA: Diagnosis not present

## 2021-07-18 DIAGNOSIS — I700 Atherosclerosis of aorta: Secondary | ICD-10-CM | POA: Diagnosis not present

## 2021-07-18 DIAGNOSIS — N179 Acute kidney failure, unspecified: Secondary | ICD-10-CM | POA: Diagnosis not present

## 2021-07-18 DIAGNOSIS — Z515 Encounter for palliative care: Secondary | ICD-10-CM | POA: Diagnosis not present

## 2021-07-18 DIAGNOSIS — K8080 Other cholelithiasis without obstruction: Secondary | ICD-10-CM | POA: Diagnosis not present

## 2021-07-18 DIAGNOSIS — I358 Other nonrheumatic aortic valve disorders: Secondary | ICD-10-CM | POA: Diagnosis present

## 2021-07-18 DIAGNOSIS — Z7901 Long term (current) use of anticoagulants: Secondary | ICD-10-CM | POA: Diagnosis not present

## 2021-07-18 DIAGNOSIS — I480 Paroxysmal atrial fibrillation: Secondary | ICD-10-CM | POA: Diagnosis not present

## 2021-07-18 DIAGNOSIS — I252 Old myocardial infarction: Secondary | ICD-10-CM | POA: Diagnosis not present

## 2021-07-18 DIAGNOSIS — D696 Thrombocytopenia, unspecified: Secondary | ICD-10-CM

## 2021-07-18 DIAGNOSIS — R578 Other shock: Secondary | ICD-10-CM | POA: Diagnosis not present

## 2021-07-18 DIAGNOSIS — K859 Acute pancreatitis without necrosis or infection, unspecified: Secondary | ICD-10-CM

## 2021-07-18 DIAGNOSIS — I484 Atypical atrial flutter: Secondary | ICD-10-CM | POA: Diagnosis not present

## 2021-07-18 DIAGNOSIS — R9431 Abnormal electrocardiogram [ECG] [EKG]: Secondary | ICD-10-CM

## 2021-07-18 DIAGNOSIS — E11649 Type 2 diabetes mellitus with hypoglycemia without coma: Secondary | ICD-10-CM | POA: Diagnosis present

## 2021-07-18 DIAGNOSIS — J90 Pleural effusion, not elsewhere classified: Secondary | ICD-10-CM | POA: Diagnosis not present

## 2021-07-18 DIAGNOSIS — D509 Iron deficiency anemia, unspecified: Secondary | ICD-10-CM | POA: Diagnosis not present

## 2021-07-18 DIAGNOSIS — I428 Other cardiomyopathies: Secondary | ICD-10-CM | POA: Diagnosis present

## 2021-07-18 DIAGNOSIS — Z7189 Other specified counseling: Secondary | ICD-10-CM | POA: Diagnosis not present

## 2021-07-18 DIAGNOSIS — K8042 Calculus of bile duct with acute cholecystitis without obstruction: Secondary | ICD-10-CM | POA: Diagnosis not present

## 2021-07-18 DIAGNOSIS — I130 Hypertensive heart and chronic kidney disease with heart failure and stage 1 through stage 4 chronic kidney disease, or unspecified chronic kidney disease: Secondary | ICD-10-CM | POA: Diagnosis present

## 2021-07-18 DIAGNOSIS — I5043 Acute on chronic combined systolic (congestive) and diastolic (congestive) heart failure: Secondary | ICD-10-CM | POA: Diagnosis not present

## 2021-07-18 DIAGNOSIS — I482 Chronic atrial fibrillation, unspecified: Secondary | ICD-10-CM | POA: Diagnosis not present

## 2021-07-18 DIAGNOSIS — E871 Hypo-osmolality and hyponatremia: Secondary | ICD-10-CM | POA: Diagnosis present

## 2021-07-18 DIAGNOSIS — K8020 Calculus of gallbladder without cholecystitis without obstruction: Secondary | ICD-10-CM | POA: Diagnosis not present

## 2021-07-18 DIAGNOSIS — I5042 Chronic combined systolic (congestive) and diastolic (congestive) heart failure: Secondary | ICD-10-CM

## 2021-07-18 DIAGNOSIS — I471 Supraventricular tachycardia: Secondary | ICD-10-CM | POA: Diagnosis not present

## 2021-07-18 DIAGNOSIS — G319 Degenerative disease of nervous system, unspecified: Secondary | ICD-10-CM | POA: Diagnosis not present

## 2021-07-18 DIAGNOSIS — F32A Depression, unspecified: Secondary | ICD-10-CM | POA: Diagnosis present

## 2021-07-18 DIAGNOSIS — N200 Calculus of kidney: Secondary | ICD-10-CM | POA: Diagnosis not present

## 2021-07-18 DIAGNOSIS — R0603 Acute respiratory distress: Secondary | ICD-10-CM | POA: Diagnosis not present

## 2021-07-18 DIAGNOSIS — K851 Biliary acute pancreatitis without necrosis or infection: Secondary | ICD-10-CM | POA: Diagnosis present

## 2021-07-18 DIAGNOSIS — K269 Duodenal ulcer, unspecified as acute or chronic, without hemorrhage or perforation: Secondary | ICD-10-CM | POA: Diagnosis not present

## 2021-07-18 LAB — COMPREHENSIVE METABOLIC PANEL
ALT: 21 U/L (ref 0–44)
AST: 49 U/L — ABNORMAL HIGH (ref 15–41)
Albumin: 3.1 g/dL — ABNORMAL LOW (ref 3.5–5.0)
Alkaline Phosphatase: 169 U/L — ABNORMAL HIGH (ref 38–126)
Anion gap: 13 (ref 5–15)
BUN: 43 mg/dL — ABNORMAL HIGH (ref 8–23)
CO2: 24 mmol/L (ref 22–32)
Calcium: 9.3 mg/dL (ref 8.9–10.3)
Chloride: 96 mmol/L — ABNORMAL LOW (ref 98–111)
Creatinine, Ser: 2.01 mg/dL — ABNORMAL HIGH (ref 0.61–1.24)
GFR, Estimated: 35 mL/min — ABNORMAL LOW (ref >60)
Glucose, Bld: 98 mg/dL (ref 70–99)
Potassium: 4 mmol/L (ref 3.5–5.1)
Sodium: 133 mmol/L — ABNORMAL LOW (ref 135–145)
Total Bilirubin: 3.7 mg/dL — ABNORMAL HIGH (ref 0.3–1.2)
Total Protein: 6.4 g/dL — ABNORMAL LOW (ref 6.5–8.1)

## 2021-07-18 LAB — CBC
HCT: 49 % (ref 39.0–52.0)
Hemoglobin: 16.2 g/dL (ref 13.0–17.0)
MCH: 26.2 pg (ref 26.0–34.0)
MCHC: 33.1 g/dL (ref 30.0–36.0)
MCV: 79.3 fL — ABNORMAL LOW (ref 80.0–100.0)
Platelets: 82 10*3/uL — ABNORMAL LOW (ref 150–400)
RBC: 6.18 MIL/uL — ABNORMAL HIGH (ref 4.22–5.81)
RDW: 21 % — ABNORMAL HIGH (ref 11.5–15.5)
WBC: 14.2 10*3/uL — ABNORMAL HIGH (ref 4.0–10.5)
nRBC: 0 % (ref 0.0–0.2)

## 2021-07-18 LAB — GLUCOSE, CAPILLARY
Glucose-Capillary: 81 mg/dL (ref 70–99)
Glucose-Capillary: 113 mg/dL — ABNORMAL HIGH (ref 70–99)
Glucose-Capillary: 61 mg/dL — ABNORMAL LOW (ref 70–99)
Glucose-Capillary: 87 mg/dL (ref 70–99)
Glucose-Capillary: 149 mg/dL — ABNORMAL HIGH (ref 70–99)

## 2021-07-18 LAB — HEMOGLOBIN A1C
Hgb A1c MFr Bld: 6.6 % — ABNORMAL HIGH (ref 4.8–5.6)
Mean Plasma Glucose: 142.72 mg/dL

## 2021-07-18 MED ORDER — DAPAGLIFLOZIN PROPANEDIOL 10 MG PO TABS
10.0000 mg | ORAL_TABLET | Freq: Every day | ORAL | Status: DC
  Administered 2021-07-18 – 2021-07-19 (×2): 10 mg via ORAL
  Filled 2021-07-18 (×2): qty 1

## 2021-07-18 MED ORDER — LACTATED RINGERS IV SOLN: INTRAVENOUS | Status: DC

## 2021-07-18 MED ORDER — INSULIN ASPART 100 UNIT/ML IJ SOLN
0.0000 [IU] | Freq: Four times a day (QID) | INTRAMUSCULAR | Status: DC | PRN
  Administered 2021-07-19 – 2021-07-20 (×2): 3 [IU] via SUBCUTANEOUS
  Administered 2021-07-20 – 2021-07-25 (×9): 2 [IU] via SUBCUTANEOUS
  Administered 2021-07-25: 3 [IU] via SUBCUTANEOUS
  Administered 2021-07-26: 2 [IU] via SUBCUTANEOUS
  Administered 2021-07-26: 3 [IU] via SUBCUTANEOUS
  Administered 2021-07-26 – 2021-07-27 (×4): 1 [IU] via SUBCUTANEOUS
  Administered 2021-07-28: 2 [IU] via SUBCUTANEOUS

## 2021-07-18 MED ORDER — PIPERACILLIN-TAZOBACTAM 3.375 G IVPB 30 MIN
3.3750 g | Freq: Once | INTRAVENOUS | Status: AC
  Administered 2021-07-18: 3.375 g via INTRAVENOUS
  Filled 2021-07-18: qty 50

## 2021-07-18 MED ORDER — HYDROMORPHONE HCL 1 MG/ML IJ SOLN
1.0000 mg | INTRAMUSCULAR | Status: DC | PRN
  Administered 2021-07-18 – 2021-07-31 (×31): 1 mg via INTRAVENOUS
  Filled 2021-07-18 (×31): qty 1

## 2021-07-18 MED ORDER — ACETAMINOPHEN 650 MG RE SUPP: 650.0000 mg | Freq: Four times a day (QID) | RECTAL | Status: DC | PRN

## 2021-07-18 MED ORDER — FUROSEMIDE 40 MG PO TABS
40.0000 mg | ORAL_TABLET | Freq: Every day | ORAL | Status: DC
  Administered 2021-07-18 – 2021-07-19 (×2): 40 mg via ORAL
  Filled 2021-07-18 (×3): qty 1

## 2021-07-18 MED ORDER — TRIMETHOBENZAMIDE HCL 100 MG/ML IM SOLN
200.0000 mg | Freq: Three times a day (TID) | INTRAMUSCULAR | Status: DC | PRN
Start: 2021-07-18 — End: 2021-07-19
  Filled 2021-07-18: qty 2

## 2021-07-18 MED ORDER — DEXTROSE 50 % IV SOLN
12.5000 g | INTRAVENOUS | Status: AC
  Administered 2021-07-18: 12.5 g via INTRAVENOUS
  Filled 2021-07-18: qty 50

## 2021-07-18 MED ORDER — POTASSIUM CHLORIDE CRYS ER 10 MEQ PO TBCR
20.0000 meq | EXTENDED_RELEASE_TABLET | Freq: Every day | ORAL | Status: DC
  Administered 2021-07-18 – 2021-07-19 (×2): 20 meq via ORAL
  Filled 2021-07-18 (×2): qty 2

## 2021-07-18 MED ORDER — ACETAMINOPHEN 325 MG PO TABS
650.0000 mg | ORAL_TABLET | Freq: Four times a day (QID) | ORAL | Status: DC | PRN
  Administered 2021-07-20 – 2021-08-07 (×7): 650 mg via ORAL
  Filled 2021-07-18 (×8): qty 2

## 2021-07-18 MED ORDER — PIPERACILLIN-TAZOBACTAM 3.375 G IVPB
3.3750 g | Freq: Three times a day (TID) | INTRAVENOUS | Status: DC
  Administered 2021-07-18 – 2021-07-21 (×10): 3.375 g via INTRAVENOUS
  Filled 2021-07-18 (×12): qty 50

## 2021-07-18 MED ORDER — HYDROMORPHONE HCL 1 MG/ML IJ SOLN
1.0000 mg | INTRAMUSCULAR | Status: DC | PRN
  Administered 2021-07-18: 1 mg via INTRAMUSCULAR
  Filled 2021-07-18: qty 1

## 2021-07-18 MED ORDER — SPIRONOLACTONE 25 MG PO TABS
25.0000 mg | ORAL_TABLET | Freq: Every day | ORAL | Status: DC
  Administered 2021-07-18 – 2021-07-19 (×2): 25 mg via ORAL
  Filled 2021-07-18 (×2): qty 1

## 2021-07-18 MED ORDER — ROSUVASTATIN CALCIUM 20 MG PO TABS
40.0000 mg | ORAL_TABLET | Freq: Every day | ORAL | Status: DC
  Administered 2021-07-18 – 2021-08-09 (×22): 40 mg via ORAL
  Filled 2021-07-18 (×8): qty 2
  Filled 2021-07-18: qty 8
  Filled 2021-07-18: qty 2
  Filled 2021-07-18: qty 8
  Filled 2021-07-18 (×2): qty 2
  Filled 2021-07-18: qty 4
  Filled 2021-07-18: qty 2
  Filled 2021-07-18: qty 8
  Filled 2021-07-18 (×7): qty 2
  Filled 2021-07-18: qty 4
  Filled 2021-07-18: qty 2

## 2021-07-18 NOTE — ED Provider Notes (Signed)
1224 Case d/w Dr. Thermon Leyland of general surgery admit to medicine    Shayma Pfefferle, MD 07/18/21 PB:[id number]

## 2021-07-18 NOTE — Consult Note (Signed)
Consulting Physician: Nickola Major Haifa Hatton  Referring Provider: Dr. Nicholes Stairs in the ER  Chief Complaint: Abdominal pain  Reason for Consult: Cystitis   Subjective   HPI: [name] [name] is an 70 y.o. male who is here for abdominal pain.  The pain came on all of a sudden.  He has never had pain like this before.  There is associated nausea.  He has been dealing with diarrhea.  He has never noticed any pain with fatty foods in the past.  He presented to the ER due to the severity of the pain and has had some relief with pain medication.  Now he only has a little bit of tenderness in the right upper quadrant.  Past Medical History:  Diagnosis Date   Acute on chronic congestive heart failure (HCC)    Acute on chronic systolic CHF (congestive heart failure) ([place]) 05/04/2021   Arthritis of left knee 12/02/2020   Bilateral leg edema 05/04/2017   CAD (coronary artery disease)    nonST elevated MI in Oct 2008 treated w/2 drug -eluting stents to the RCA and  mid LAD, EF 55%   CAD (coronary artery disease), native coronary artery 11/12/2018   Formatting of this note might be different from the original. S/p MI - Dr. 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 Hypertension  10/1 IMO update   Gout, unspecified 10/14/2007   Overview:  Gout  10/1 IMO update  Formatting of this note might be different from the original. Gout  10/1 IMO update   History of colonic polyps 11/12/2018   Overview:  rpt colonoscopy 5/17; Dr. Haig Prophet of this note  might be different from the original. rpt colonoscopy 5/17; Dr. Erlene Quan   History of kidney stones    History of nephrolithiasis 11/12/2018   History of smoking 11/12/2018   Overview:  quit 2000  Formatting of this note might be different from the original. quit 2000   HTN (hypertension)    Hyperlipidemia    HYPERTENSION, BENIGN 06/15/2009   Inguinal hernia without mention of obstruction or gangrene, recurrent unilateral or unspecified 11/12/2018   Ischemic cardiomyopathy 08/12/2020   Medication intolerance 11/12/2018   Mixed hyperlipidemia 11/12/2018   Old myocardial infarction 11/12/2018   Paroxysmal atrial fibrillation ([place]) 07/16/2020   Pre-operative cardiovascular examination 11/12/2018   Primary osteoarthritis of both knees 03/05/2017   Considering Zilretta injections. Good improvement with corticosteroid injections however short-lived. Mild improvement with Visco supplementation.   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 Diabetes Mellitus   Unilateral primary osteoarthritis, right knee 11/15/2018    Past Surgical History:  Procedure Laterality Date   BILATERAL KNEE ARTHROSCOPY     CORONARY ANGIOPLASTY WITH STENT PLACEMENT     LAPAROSCOPIC INGUINAL HERNIA  REPAIR     LEFT HEART CATH AND CORONARY ANGIOGRAPHY N/A 05/05/2021   Procedure: LEFT HEART CATH AND CORONARY ANGIOGRAPHY;  Surgeon: Troy Sine, MD;  Location: [facility];  Service: Cardiovascular;  Laterality: N/A;   NOSE SURGERY     TOTAL KNEE ARTHROPLASTY Right 11/15/2018   Procedure: RIGHT TOTAL KNEE ARTHROPLASTY;  Surgeon: Mcarthur Rossetti, MD;  Location: [facility];  Service:  Orthopedics;  Laterality: Right;   TOTAL KNEE ARTHROPLASTY Left 12/03/2020   Procedure: LEFT TOTAL KNEE ARTHROPLASTY;  Surgeon: Mcarthur Rossetti, MD;  Location: [facility];  Service: Orthopedics;  Laterality: Left;    Family History  Problem Relation Age of Onset   Hypertension Mother    Cancer Mother    Heart attack Father    Hypertension Father    Diabetes Father    Hypertension Brother    Irregular heart beat Brother     Social:  reports that he has quit smoking. His smoking use included cigarettes. He has a 5.00 pack-year smoking history. He quit smokeless tobacco use about 52 years ago.  His smokeless tobacco use included snuff and chew. He reports that he does not drink alcohol and does not use drugs.  Allergies:  Allergies  Allergen Reactions   Allopurinol Other (See Comments)    Felt terrible   Aripiprazole Other (See Comments)    Felt terrible   Bupropion Other (See Comments)    Felt bad   Buspirone Other (See Comments)    Felt bad   Desvenlafaxine Other (See Comments)    Made him hyper   Duloxetine Other (See Comments)    Knocked him out   Escitalopram Other (See Comments)    Felt like crap   Fish Oil     unknown   Fluticasone Furoate Other (See Comments)    Unknown    Hydroxyzine Hcl Other (See Comments)    Knocked him out   Nortriptyline Other (See Comments)    Ineffective   Paroxetine Hcl Other (See Comments)    unknown   Ramipril Cough   Sertraline Other (See Comments)    "felt plugged in"   Venlafaxine Other (See Comments)    Felt bad   Vilazodone Other (See Comments)    "felt weird"    Medications: Current Outpatient Medications  Medication Instructions   albuterol (VENTOLIN HFA) 108 (90 Base) MCG/ACT inhaler 2 puffs, Inhalation, Every 6 hours PRN   ALPRAZolam (XANAX) 0.25 mg, Oral, At bedtime PRN   apixaban (ELIQUIS) 5 mg, Oral, 2 times daily   Cialis 20 mg, Daily PRN   colchicine 0.6 mg, Oral, Daily PRN, Gout flare up   cyanocobalamin  ((VITAMIN B-12)) 1,000 mcg, Intramuscular, Every 30 days   dapagliflozin propanediol (FARXIGA) 10 mg, Oral, Daily at bedtime   fluticasone (FLONASE) 50 MCG/ACT nasal spray 1 spray, Each Nare, Daily PRN   furosemide (LASIX) 40 mg, Oral, Daily   Lantus SoloStar 18 Units, Subcutaneous, Nightly   levalbuterol (XOPENEX HFA) 45 MCG/ACT inhaler 1 puff, Inhalation, Every 4 hours PRN   potassium chloride SA (KLOR-CON) 20 MEQ tablet 20 mEq, Oral, Daily   rosuvastatin (CRESTOR) 40 mg, Oral, Daily at bedtime   spironolactone (ALDACTONE) 25 mg, Oral, Daily at bedtime   traMADol (ULTRAM) 25 mg, Oral, Every 4 hours PRN    ROS - all of the below systems have been reviewed with the patient and positives are indicated with bold text General: chills, fever or night sweats Eyes: blurry vision or  double vision ENT: epistaxis or sore throat Allergy/Immunology: itchy/watery eyes or nasal congestion Hematologic/Lymphatic: bleeding problems, blood clots or swollen lymph nodes Endocrine: temperature intolerance or unexpected weight changes Breast: new or changing breast lumps or nipple discharge Resp: cough, shortness of breath, or wheezing CV: chest pain or dyspnea on exertion GI: as per HPI GU: dysuria, trouble voiding, or hematuria MSK: joint pain or joint stiffness Neuro: TIA or stroke symptoms Derm: pruritus and skin lesion changes Psych: anxiety and depression  Objective   PE Blood pressure 113/83, pulse 95, temperature (!) 97.5 F (36.4 C), temperature source Oral, resp. rate (!) 22, height '6\' 1"'$  (1.854 m), weight 101 kg, SpO2 96 %. Constitutional: NAD; conversant; no deformities Eyes: Moist conjunctiva; no lid lag; anicteric; PERRL Neck: Trachea midline; no thyromegaly Lungs: Normal respiratory effort; no tactile fremitus CV: RRR; no palpable thrills; no pitting edema GI: Abd soft, mild tenderness in the right upper quadrant, no rebound guarding or peritoneal signs; no palpable  hepatosplenomegaly MSK: Normal range of motion of extremities; no clubbing/cyanosis Psychiatric: Appropriate affect; alert and oriented x3 Lymphatic: No palpable cervical or axillary lymphadenopathy  Results for orders placed or performed during the hospital encounter of 07/17/21 (from the past 24 hour(s))  Comprehensive metabolic panel     Status: Abnormal   Collection Time: 07/17/21  6:48 PM  Result Value Ref Range   Sodium 133 (L) 135 - 145 mmol/L   Potassium 4.2 3.5 - 5.1 mmol/L   Chloride 96 (L) 98 - 111 mmol/L   CO2 25 22 - 32 mmol/L   Glucose, Bld 128 (H) 70 - 99 mg/dL   BUN 44 (H) 8 - 23 mg/dL   Creatinine, Ser 2.00 (H) 0.61 - 1.24 mg/dL   Calcium 9.4 8.9 - 10.3 mg/dL   Total Protein 7.0 6.5 - 8.1 g/dL   Albumin 3.3 (L) 3.5 - 5.0 g/dL   AST 51 (H) 15 - 41 U/L   ALT 23 0 - 44 U/L   Alkaline Phosphatase 171 (H) 38 - 126 U/L   Total Bilirubin 4.1 (H) 0.3 - 1.2 mg/dL   GFR, Estimated 36 (L) >60 mL/min   Anion gap 12 5 - 15  CBC with Differential     Status: Abnormal   Collection Time: 07/17/21  6:48 PM  Result Value Ref Range   WBC 16.5 (H) 4.0 - 10.5 K/uL   RBC 6.47 (H) 4.22 - 5.81 MIL/uL   Hemoglobin 16.8 13.0 - 17.0 g/dL   HCT 53.2 (H) 39.0 - 52.0 %   MCV 82.2 80.0 - 100.0 fL   MCH 26.0 26.0 - 34.0 pg   MCHC 31.6 30.0 - 36.0 g/dL   RDW 21.2 (H) 11.5 - 15.5 %   Platelets 88 (L) 150 - 400 K/uL   nRBC 0.0 0.0 - 0.2 %   Neutrophils Relative % 88 %   Neutro Abs 14.5 (H) 1.7 - 7.7 K/uL   Lymphocytes Relative 3 %   Lymphs Abs 0.4 (L) 0.7 - 4.0 K/uL   Monocytes Relative 8 %   Monocytes Absolute 1.4 (H) 0.1 - 1.0 K/uL   Eosinophils Relative 0 %   Eosinophils Absolute 0.0 0.0 - 0.5 K/uL   Basophils Relative 0 %   Basophils Absolute 0.0 0.0 - 0.1 K/uL   Immature Granulocytes 1 %   Abs Immature Granulocytes 0.10 (H) 0.00 - 0.07 K/uL  Lipase, blood     Status: Abnormal   Collection Time: 07/17/21  6:48 PM  Result Value  Ref Range   Lipase 105 (H) 11 - 51 U/L   Urinalysis, Routine w reflex microscopic Urine, Clean Catch     Status: Abnormal   Collection Time: 07/17/21  6:48 PM  Result Value Ref Range   Color, Urine AMBER (A) YELLOW   APPearance CLOUDY (A) CLEAR   Specific Gravity, Urine 1.016 1.005 - 1.030   pH 5.0 5.0 - 8.0   Glucose, UA >=500 (A) NEGATIVE mg/dL   Hgb urine dipstick MODERATE (A) NEGATIVE   Bilirubin Urine NEGATIVE NEGATIVE   Ketones, ur NEGATIVE NEGATIVE mg/dL   Protein, ur 100 (A) NEGATIVE mg/dL   Nitrite NEGATIVE NEGATIVE   Leukocytes,Ua NEGATIVE NEGATIVE   RBC / HPF >50 (H) 0 - 5 RBC/hpf   WBC, UA 6-10 0 - 5 WBC/hpf   Bacteria, UA RARE (A) NONE SEEN   Squamous Epithelial / LPF 0-5 0 - 5   Mucus PRESENT   Resp Panel by RT-PCR (Flu A&B, Covid) Nasopharyngeal Swab     Status: None   Collection Time: 07/17/21  8:01 PM   Specimen: Nasopharyngeal Swab; Nasopharyngeal(NP) swabs in vial transport medium  Result Value Ref Range   SARS Coronavirus 2 by RT PCR NEGATIVE NEGATIVE   Influenza A by PCR NEGATIVE NEGATIVE   Influenza B by PCR NEGATIVE NEGATIVE  Comprehensive metabolic panel     Status: Abnormal   Collection Time: 07/18/21  2:10 AM  Result Value Ref Range   Sodium 133 (L) 135 - 145 mmol/L   Potassium 4.0 3.5 - 5.1 mmol/L   Chloride 96 (L) 98 - 111 mmol/L   CO2 24 22 - 32 mmol/L   Glucose, Bld 98 70 - 99 mg/dL   BUN 43 (H) 8 - 23 mg/dL   Creatinine, Ser 2.01 (H) 0.61 - 1.24 mg/dL   Calcium 9.3 8.9 - 10.3 mg/dL   Total Protein 6.4 (L) 6.5 - 8.1 g/dL   Albumin 3.1 (L) 3.5 - 5.0 g/dL   AST 49 (H) 15 - 41 U/L   ALT 21 0 - 44 U/L   Alkaline Phosphatase 169 (H) 38 - 126 U/L   Total Bilirubin 3.7 (H) 0.3 - 1.2 mg/dL   GFR, Estimated 35 (L) >60 mL/min   Anion gap 13 5 - 15  CBC     Status: Abnormal   Collection Time: 07/18/21  2:10 AM  Result Value Ref Range   WBC 14.2 (H) 4.0 - 10.5 K/uL   RBC 6.18 (H) 4.22 - 5.81 MIL/uL   Hemoglobin 16.2 13.0 - 17.0 g/dL   HCT 49.0 39.0 - 52.0 %   MCV 79.3 (L) 80.0 - 100.0  fL   MCH 26.2 26.0 - 34.0 pg   MCHC 33.1 30.0 - 36.0 g/dL   RDW 21.0 (H) 11.5 - 15.5 %   Platelets 82 (L) 150 - 400 K/uL   nRBC 0.0 0.0 - 0.2 %     Imaging Orders         US Abdomen Limited RUQ (LIVER/GB)         CT ABDOMEN PELVIS WO CONTRAST      Assessment and Plan   [name] [name] is an 70 y.o. male who presented with abdominal pain found to have choledocholithiasis.  He has been admitted to the medicine service and gastroenterology has been consulted.  He is unsure when he last took his Eliquis, though he says its been a few days.  We will follow along with the gastroenterology team, he will likely  require an ERCP prior to cholecystectomy and be here for few days.    Felicie Morn, MD  [facility] Use [email or web address] to contact on call provider

## 2021-07-18 NOTE — Progress Notes (Signed)
Patient ID: [name] [name], male   DOB: 07-Dec-1950, 70 y.o.   MRN: [id number] Patient admitted early this morning for abdominal pain and intermittent diarrhea and was found to have acute cholecystitis with choledocholithiasis with leukocytosis.  He has been started on IV antibiotics.  GI and general surgery have been consulted.  Patient seen and examined at bedside.  Plan of care discussed with him.  I have reviewed patient's medical records including this morning's H&P, vitals, labs, medications myself.  Follow GI and general surgery recommendations.  Repeat a.m. labs.

## 2021-07-18 NOTE — Progress Notes (Signed)
New orders were released, patient has an order for cardiac monitoring. When patient was approved there was no order for tele. MD Chotiner was secured chat about cardiac monitoring order and if patient news tele he needs to be transferred.

## 2021-07-18 NOTE — Progress Notes (Signed)
Notified by CCMD at [place] that patient had a 7-beat run of vtach. Patient had no c/o pain or discomfort. EKG obtained as ordered. MD made aware, will continue to monitor.

## 2021-07-18 NOTE — H&P (View-Only) (Signed)
UNASSIGNED PATIENT Reason for Consult: Choledocholithiasis. Referring Physician: THP.  [name] [name] is an 70 y.o. male.  HPI: [name] [name] is a 70 year old white male with multiple medical problems as below who presented to the ER with a 3-day history of abdominal pain was in the right upper quadrant along with decreased appetite and poor oral intake.  He denies having any fevers or chills.  CT scan of the abdomen pelvis done in the emergency room revealed acute cholecystitis with choledocholithiasis and a stone in the distal CBD he also had mild pancreatitis on CT scan with a lipase of 105 bilirubin of 4.1 and white count of 16.5K.  An ERCP was requested.  Past Medical History:  Diagnosis Date   Acute on chronic congestive heart failure (HCC)    Acute on chronic systolic CHF (congestive heart failure) ([facility]) 05/04/2021   Arthritis of left knee 12/02/2020   Bilateral leg edema 05/04/2017   CAD (coronary artery disease)    nonST elevated MI in Oct 2008 treated w/2 drug -eluting stents to the RCA and  mid LAD, EF 55%   CAD (coronary artery disease), native coronary artery 11/12/2018   Formatting of this note might be different from the original. S/p MI - Dr. 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 Hypertension  10/1 IMO update   Gout, unspecified 10/14/2007   Overview:  Gout  10/1 IMO update  Formatting of this note might be different from the original. Gout  10/1 IMO update   History of colonic polyps 11/12/2018   Overview:  rpt  colonoscopy 5/17; Dr. Haig Prophet of this note might be different from the original. rpt colonoscopy 5/17; Dr. Erlene Quan   History of kidney stones    History of nephrolithiasis 11/12/2018   History of smoking 11/12/2018   Overview:  quit 2000  Formatting of this note might be different from the original. quit 2000   HTN (hypertension)    Hyperlipidemia    HYPERTENSION, BENIGN 06/15/2009   Inguinal hernia without mention of obstruction or gangrene, recurrent unilateral or unspecified 11/12/2018   Ischemic cardiomyopathy 08/12/2020   Medication intolerance 11/12/2018   Mixed hyperlipidemia 11/12/2018   Old myocardial infarction 11/12/2018   Paroxysmal atrial fibrillation ([facility]) 07/16/2020   Pre-operative cardiovascular examination 11/12/2018   Primary osteoarthritis of both knees 03/05/2017   Considering Zilretta injections. Good improvement with corticosteroid injections however short-lived. Mild improvement with Visco supplementation.   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 Diabetes Mellitus   Unilateral primary osteoarthritis, right knee 11/15/2018   Past Surgical History:  Procedure Laterality Date   BILATERAL KNEE ARTHROSCOPY     CORONARY ANGIOPLASTY WITH STENT PLACEMENT     LAPAROSCOPIC INGUINAL HERNIA REPAIR     LEFT HEART  CATH AND CORONARY ANGIOGRAPHY N/A 05/05/2021   Procedure: LEFT HEART CATH AND CORONARY ANGIOGRAPHY;  Surgeon: Troy Sine, MD;  Location: [facility];  Service: Cardiovascular;  Laterality: N/A;   NOSE SURGERY     TOTAL KNEE ARTHROPLASTY Right 11/15/2018   Procedure: RIGHT TOTAL KNEE ARTHROPLASTY;  Surgeon: Mcarthur Rossetti, MD;  Location: [facility];  Service: Orthopedics;  Laterality: Right;   TOTAL KNEE ARTHROPLASTY Left 12/03/2020   Procedure: LEFT TOTAL KNEE ARTHROPLASTY;  Surgeon: Mcarthur Rossetti, MD;  Location: [facility];  Service: Orthopedics;  Laterality: Left;   Family History  Problem Relation Age of Onset   Hypertension Mother    Cancer Mother    Heart attack Father    Hypertension Father    Diabetes Father    Hypertension Brother    Irregular heart beat Brother    Social History:  reports that he has quit smoking. His smoking use included cigarettes. He has a 5.00 pack-year smoking history. He quit smokeless tobacco use about 52 years ago.  His smokeless tobacco use included snuff and chew. He reports that he does not drink alcohol and does not use drugs.  Allergies:  Allergies  Allergen Reactions   Allopurinol Other (See Comments)    Felt terrible   Aripiprazole Other (See Comments)    Felt terrible   Bupropion Other (See Comments)    Felt bad   Buspirone Other (See Comments)    Felt bad   Desvenlafaxine Other (See Comments)    Made him hyper   Duloxetine Other (See Comments)    Knocked him out   Escitalopram Other (See Comments)    Felt like crap   Fish Oil     unknown   Fluticasone Furoate Other (See Comments)    Unknown    Hydroxyzine Hcl Other (See Comments)    Knocked him out   Nortriptyline Other (See Comments)    Ineffective   Paroxetine Hcl Other (See Comments)    unknown   Ramipril Cough   Sertraline Other (See Comments)    "felt plugged in"   Venlafaxine Other (See Comments)    Felt bad   Vilazodone Other (See Comments)    "felt weird"   Medications: I have reviewed the patient's current medications. Prior to Admission:  Medications Prior to Admission  Medication Sig Dispense Refill Last Dose   ALPRAZolam (XANAX) 0.25 MG tablet Take 0.25 mg by mouth at bedtime as needed for anxiety.       apixaban (ELIQUIS) 5 MG TABS tablet Take 1 tablet (5 mg  total) by mouth 2 (two) times daily. 60 tablet 0 07/16/2021 at pm   colchicine 0.6 MG tablet Take 0.6 mg by mouth daily as needed for other. Gout flare up      cyanocobalamin (,VITAMIN B-12,) 1000 MCG/ML injection Inject 1,000 mcg into the muscle every 30 (thirty) days.    Past Month   dapagliflozin propanediol (FARXIGA) 10 MG TABS tablet Take 1 tablet (10 mg total) by mouth at bedtime. 30 tablet 11 07/16/2021   furosemide (LASIX) 40 MG tablet Take 1 tablet (40 mg total) by mouth daily. 30 tablet 3 07/16/2021   LANTUS SOLOSTAR 100 UNIT/ML Solostar Pen Inject 18 Units into the skin at bedtime.   07/16/2021 at hs   levalbuterol (XOPENEX HFA) 45 MCG/ACT inhaler Inhale 1 puff into the lungs every 4 (four) hours as needed for wheezing.      potassium chloride SA (KLOR-CON) 20 MEQ tablet Take  1 tablet (20 mEq total) by mouth daily. 30 tablet 3 07/16/2021 at hs   rosuvastatin (CRESTOR) 40 MG tablet Take 1 tablet (40 mg total) by mouth at bedtime. 30 tablet 3 07/16/2021 at hs   spironolactone (ALDACTONE) 25 MG tablet Take 1 tablet (25 mg total) by mouth at bedtime. 90 tablet 3 07/16/2021 at hs   traMADol (ULTRAM) 50 MG tablet Take 25 mg by mouth every 4 (four) hours as needed for pain or severe pain.   07/17/2021   albuterol (VENTOLIN HFA) 108 (90 Base) MCG/ACT inhaler Inhale 2 puffs into the lungs every 6 (six) hours as needed for shortness of breath.      CIALIS 20 MG tablet Take 20 mg by mouth daily as needed for erectile dysfunction.  (Patient not taking: Reported on 07/18/2021)   Not Taking   fluticasone (FLONASE) 50 MCG/ACT nasal spray Place 1 spray into both nostrils daily as needed for allergies or rhinitis.      Scheduled:  dapagliflozin propanediol  10 mg Oral QHS   furosemide  40 mg Oral Daily   potassium chloride SA  20 mEq Oral Daily   rosuvastatin  40 mg Oral QHS   spironolactone  25 mg Oral QHS   Continuous:  lactated ringers 75 mL/hr at 07/18/21 1539   piperacillin-tazobactam (ZOSYN)  IV  3.375 g (07/18/21 1545)   [id number] **OR** acetaminophen, HYDROmorphone (DILAUDID) injection, insulin aspart, trimethobenzamide  Results for orders placed or performed during the hospital encounter of 07/17/21 (from the past 48 hour(s))  Comprehensive metabolic panel     Status: Abnormal   Collection Time: 07/17/21  6:48 PM  Result Value Ref Range   Sodium 133 (L) 135 - 145 mmol/L   Potassium 4.2 3.5 - 5.1 mmol/L   Chloride 96 (L) 98 - 111 mmol/L   CO2 25 22 - 32 mmol/L   Glucose, Bld 128 (H) 70 - 99 mg/dL    Comment: Glucose reference range applies only to samples taken after fasting for at least 8 hours.   BUN 44 (H) 8 - 23 mg/dL   Creatinine, Ser 2.00 (H) 0.61 - 1.24 mg/dL   Calcium 9.4 8.9 - 10.3 mg/dL   Total Protein 7.0 6.5 - 8.1 g/dL   Albumin 3.3 (L) 3.5 - 5.0 g/dL   AST 51 (H) 15 - 41 U/L   ALT 23 0 - 44 U/L   Alkaline Phosphatase 171 (H) 38 - 126 U/L   Total Bilirubin 4.1 (H) 0.3 - 1.2 mg/dL   GFR, Estimated 36 (L) >60 mL/min    Comment: (NOTE) Calculated using the CKD-EPI Creatinine Equation (2021)    Anion gap 12 5 - 15    Comment: Performed at [facility], [place] [address] [place], [place] [address]  CBC with Differential     Status: Abnormal   Collection Time: 07/17/21  6:48 PM  Result Value Ref Range   WBC 16.5 (H) 4.0 - 10.5 K/uL   RBC 6.47 (H) 4.22 - 5.81 MIL/uL   Hemoglobin 16.8 13.0 - 17.0 g/dL   HCT 53.2 (H) 39.0 - 52.0 %   MCV 82.2 80.0 - 100.0 fL   MCH 26.0 26.0 - 34.0 pg   MCHC 31.6 30.0 - 36.0 g/dL   RDW 21.2 (H) 11.5 - 15.5 %   Platelets 88 (L) 150 - 400 K/uL    Comment: SPECIMEN CHECKED FOR CLOTS Immature Platelet Fraction may be clinically indicated, consider ordering this additional test [id number] PLATELET COUNT  CONFIRMED BY SMEAR    nRBC 0.0 0.0 - 0.2 %   Neutrophils Relative % 88 %   Neutro Abs 14.5 (H) 1.7 - 7.7 K/uL   Lymphocytes Relative 3 %   Lymphs Abs 0.4 (L) 0.7 - 4.0 K/uL   Monocytes Relative 8 %    Monocytes Absolute 1.4 (H) 0.1 - 1.0 K/uL   Eosinophils Relative 0 %   Eosinophils Absolute 0.0 0.0 - 0.5 K/uL   Basophils Relative 0 %   Basophils Absolute 0.0 0.0 - 0.1 K/uL   Immature Granulocytes 1 %   Abs Immature Granulocytes 0.10 (H) 0.00 - 0.07 K/uL    Comment: Performed at [facility], [place] [address], [place], [place] [address]  Lipase, blood     Status: Abnormal   Collection Time: 07/17/21  6:48 PM  Result Value Ref Range   Lipase 105 (H) 11 - 51 U/L    Comment: Performed at [facility], [place] [address] [place], [place] [address]  Urinalysis, Routine w reflex microscopic Urine, Clean Catch     Status: Abnormal   Collection Time: 07/17/21  6:48 PM  Result Value Ref Range   Color, Urine AMBER (A) YELLOW    Comment: BIOCHEMICALS MAY BE AFFECTED BY COLOR   APPearance CLOUDY (A) CLEAR   Specific Gravity, Urine 1.016 1.005 - 1.030   pH 5.0 5.0 - 8.0   Glucose, UA >=500 (A) NEGATIVE mg/dL   Hgb urine dipstick MODERATE (A) NEGATIVE   Bilirubin Urine NEGATIVE NEGATIVE   Ketones, ur NEGATIVE NEGATIVE mg/dL   Protein, ur 100 (A) NEGATIVE mg/dL   Nitrite NEGATIVE NEGATIVE   Leukocytes,Ua NEGATIVE NEGATIVE   RBC / HPF >50 (H) 0 - 5 RBC/hpf   WBC, UA 6-10 0 - 5 WBC/hpf   Bacteria, UA RARE (A) NONE SEEN   Squamous Epithelial / LPF 0-5 0 - 5   Mucus PRESENT     Comment: Performed at [facility], [place] [address], [place], [place] [address]  Resp Panel by RT-PCR (Flu A&B, Covid) Nasopharyngeal Swab     Status: None   Collection Time: 07/17/21  8:01 PM   Specimen: Nasopharyngeal Swab; Nasopharyngeal(NP) swabs in vial transport medium  Result Value Ref Range   SARS Coronavirus 2 by RT PCR NEGATIVE NEGATIVE    Comment: (NOTE) SARS-CoV-2 target nucleic acids are NOT DETECTED.  The SARS-CoV-2 RNA is generally detectable in upper respiratory specimens during the acute phase of infection. The lowest concentration of SARS-CoV-2  viral copies this assay can detect is 138 copies/mL. A negative result does not preclude SARS-Cov-2 infection and should not be used as the sole basis for treatment or other patient management decisions. A negative result may occur with  improper specimen collection/handling, submission of specimen other than nasopharyngeal swab, presence of viral mutation(s) within the areas targeted by this assay, and inadequate number of viral copies(<138 copies/mL). A negative result must be combined with clinical observations, patient history, and epidemiological information. The expected result is Negative.  Fact Sheet for Patients:  EntrepreneurPulse.com.au  Fact Sheet for Healthcare Providers:  IncredibleEmployment.be  This test is no t yet approved or cleared by the [place] FDA and  has been authorized for detection and/or diagnosis of SARS-CoV-2 by FDA under an Emergency Use Authorization (EUA). This EUA will remain  in effect (meaning this test can be used) for the duration of the COVID-19 declaration under Section 564(b)(1) of the Act, 21 U.S.C.section 360bbb-3(b)(1), unless the  authorization is terminated  or revoked sooner.       Influenza A by PCR NEGATIVE NEGATIVE   Influenza B by PCR NEGATIVE NEGATIVE    Comment: (NOTE) The Xpert Xpress SARS-CoV-2/FLU/RSV plus assay is intended as an aid in the diagnosis of influenza from Nasopharyngeal swab specimens and should not be used as a sole basis for treatment. Nasal washings and aspirates are unacceptable for Xpert Xpress SARS-CoV-2/FLU/RSV testing.  Fact Sheet for Patients: EntrepreneurPulse.com.au  Fact Sheet for Healthcare Providers: IncredibleEmployment.be  This test is not yet approved or cleared by the [place] FDA and has been authorized for detection and/or diagnosis of SARS-CoV-2 by FDA under an Emergency Use Authorization (EUA). This EUA  will remain in effect (meaning this test can be used) for the duration of the COVID-19 declaration under Section 564(b)(1) of the Act, 21 U.S.C. section 360bbb-3(b)(1), unless the authorization is terminated or revoked.  Performed at [facility], [place] [address] [place], [place] [address]   Comprehensive metabolic panel     Status: Abnormal   Collection Time: 07/18/21  2:10 AM  Result Value Ref Range   Sodium 133 (L) 135 - 145 mmol/L   Potassium 4.0 3.5 - 5.1 mmol/L   Chloride 96 (L) 98 - 111 mmol/L   CO2 24 22 - 32 mmol/L   Glucose, Bld 98 70 - 99 mg/dL    Comment: Glucose reference range applies only to samples taken after fasting for at least 8 hours.   BUN 43 (H) 8 - 23 mg/dL   Creatinine, Ser 2.01 (H) 0.61 - 1.24 mg/dL   Calcium 9.3 8.9 - 10.3 mg/dL   Total Protein 6.4 (L) 6.5 - 8.1 g/dL   Albumin 3.1 (L) 3.5 - 5.0 g/dL   AST 49 (H) 15 - 41 U/L   ALT 21 0 - 44 U/L   Alkaline Phosphatase 169 (H) 38 - 126 U/L   Total Bilirubin 3.7 (H) 0.3 - 1.2 mg/dL   GFR, Estimated 35 (L) >60 mL/min    Comment: (NOTE) Calculated using the CKD-EPI Creatinine Equation (2021)    Anion gap 13 5 - 15    Comment: Performed at [facility], [place] [address] [place], [place] [address]  CBC     Status: Abnormal   Collection Time: 07/18/21  2:10 AM  Result Value Ref Range   WBC 14.2 (H) 4.0 - 10.5 K/uL   RBC 6.18 (H) 4.22 - 5.81 MIL/uL   Hemoglobin 16.2 13.0 - 17.0 g/dL   HCT 49.0 39.0 - 52.0 %   MCV 79.3 (L) 80.0 - 100.0 fL   MCH 26.2 26.0 - 34.0 pg   MCHC 33.1 30.0 - 36.0 g/dL   RDW 21.0 (H) 11.5 - 15.5 %   Platelets 82 (L) 150 - 400 K/uL    Comment: Immature Platelet Fraction may be clinically indicated, consider ordering this additional test [id number]    nRBC 0.0 0.0 - 0.2 %    Comment: Performed at [facility], [place] [address] [place], [place] [address]    CT ABDOMEN PELVIS WO CONTRAST  Result Date: 07/18/2021 CLINICAL  DATA:  Abdominal pain x3 days. EXAM: CT ABDOMEN AND PELVIS WITHOUT CONTRAST TECHNIQUE: Multidetector CT imaging of the abdomen and pelvis was performed following the standard protocol without IV contrast. COMPARISON:  None. FINDINGS: Lower chest: Moderate severity atelectasis and/or infiltrate is seen within the posterior aspects of the bilateral lung bases. There are small bilateral pleural effusions. Hepatobiliary:  No focal liver abnormality is seen. The gallbladder is markedly distended with diffuse gallbladder wall thickening and pericholecystic inflammation. A 2 mm gallstone is suspected within the distal common bile duct (axial CT image 39, CT series 2). Pancreas: Mild inflammatory fat stranding is seen along the posterior aspect of the tail of the pancreas. The pancreatic parenchyma is otherwise normal in appearance. Spleen: Normal in size without focal abnormality. Adrenals/Urinary Tract: Adrenal glands are unremarkable. Kidneys are normal in size, without obstructing renal calculi or hydronephrosis. A 9 mm nonobstructing renal stone is seen within the mid right kidney. A 1.4 cm diameter cyst is seen within the anterior aspect of the mid right kidney. Bladder is unremarkable. Stomach/Bowel: Stomach is within normal limits. Appendix appears normal. No evidence of bowel wall thickening, distention, or inflammatory changes. Vascular/Lymphatic: Aortic atherosclerosis. No enlarged abdominal or pelvic lymph nodes. Reproductive: Prostate is unremarkable. Other: No abdominal wall hernia or abnormality. A small amount of abdominal and pelvic free fluid is seen with diffuse nonspecific mesenteric inflammatory fat stranding noted. Musculoskeletal: Degenerative changes are seen within the lumbar spine, most prominent at the level of L5-S1. IMPRESSION: 1. Findings consistent with acute cholecystitis with a tiny gallstone noted within the distal common bile duct. 2. Mild acute pancreatitis along the tail of the pancreas  can not be excluded. Correlation with pancreatic enzymes is recommended. 3. 9 mm nonobstructing renal stone within the right kidney. Electronically Signed   By: Virgina Norfolk M.D.   On: 07/18/2021 00:02   US Abdomen Limited RUQ (LIVER/GB)  Result Date: 07/17/2021 CLINICAL DATA:  Right upper quadrant pain for 4 days. EXAM: ULTRASOUND ABDOMEN LIMITED RIGHT UPPER QUADRANT COMPARISON:  None. FINDINGS: Gallbladder: The gallbladder wall is thickened and mildly irregular. Sludge is identified. At least one 6 mm stone is noted. Pericholecystic fluid is identified. No Murphy's sign reported. Common bile duct: Diameter: 3 mm Liver: No focal lesion identified. Within normal limits in parenchymal echogenicity. Portal vein is patent on color Doppler imaging with normal direction of blood flow towards the liver. Other: None. IMPRESSION: 1. The gallbladder is abnormal in appearance with a mildly irregular thickened wall. There is sludge in the gallbladder as well as at least 1 stone. Pericholecystic fluid is noted. No Murphy's sign. If the clinical picture remains ambiguous, recommend a HIDA scan. 2. A small amount of free fluid is seen in the abdomen. 3. No other acute abnormalities. Electronically Signed   By: Dorise Bullion III M.D.   On: 07/17/2021 21:01    Review of Systems  Constitutional:  Positive for appetite change and fatigue. Negative for activity change, chills, diaphoresis, fever and unexpected weight change.  HENT: Negative.    Eyes: Negative.   Respiratory: Negative.    Gastrointestinal:  Positive for abdominal pain, diarrhea and nausea. Negative for anal bleeding, blood in stool and constipation.  Endocrine: Negative.   Genitourinary: Negative.   Skin: Negative.   Allergic/Immunologic: Negative.   Neurological:  Positive for weakness.  Hematological: Negative.   Psychiatric/Behavioral: Negative.    Blood pressure 113/83, pulse 95, temperature (!) 97.5 F (36.4 C), temperature source Oral,  resp. rate (!) 22, height '6\' 1"'$  (1.854 m), weight 101 kg, SpO2 96 %. Physical Exam Constitutional:      Appearance: He is well-developed.  HENT:     Head: Normocephalic and atraumatic.  Cardiovascular:     Rate and Rhythm: Normal rate and regular rhythm.  Pulmonary:     Effort: Pulmonary effort is normal.  Breath sounds: Normal breath sounds.  Abdominal:     General: There is no distension.     Palpations: Abdomen is soft.     Tenderness: There is abdominal tenderness in the right upper quadrant and periumbilical area. There is guarding. There is no rebound.  Skin:    General: Skin is warm and dry.  Neurological:     Mental Status: He is alert and oriented to person, place, and time.  Psychiatric:        Mood and Affect: Mood normal.        Behavior: Behavior normal.  . Assessment/Plan: 1) Acute cholecystitis with choledocholithiasis and mild pancreatitis with hyperbilirubinemia/abnormal CT scan-total bili of 4.1 and WBC count of 16.5K-on Zosyn. ERCP scheduled for tomorrow 2) AODM/HTN/CKD. 3) Chronic systolic CHF. 4) Chronic atrial fibrillation. Juanita Craver 07/18/2021, 6:53 AM

## 2021-07-18 NOTE — H&P (Signed)
History and Physical    [name] [name] [id number] DOB: 12-01-1950 DOA: 07/17/2021  PCP: Raeanne Gathers, MD   Patient coming from: Home  Chief Complaint: Abdominal pain, diarrhea  HPI: [name] [name] is a 70 y.o. male with medical history significant for DMT2, Chronic systolic CHF, CKD 3, PAF, CAD, HTN, OA who presents for evaluation of right upper abdominal pain.  Reports he has had pain for the last 3 days and has progressively gotten worse.  He is also had some mild intermittent diarrhea as well as mild nausea but no vomiting with his symptoms.  He states he has not had any fevers or chills.  He has had decreased appetite and p.o. intake secondary to not feeling well and stomach pain.  States he has never had abdominal pain like this in the past.  He has had a hernia repair but no other abdominal surgeries.  He denies any chest pain, palpitations, shortness of breath.  He states that the pain will radiate around to his right flank region.  Not had any abdominal injury or trauma to his back.  He denies any dysuria or urinary hesitancy or frequency.  He is widowed and lives alone.  Denies tobacco, alcohol, illicit drug use.  ED Course: [name] [name] has been hemodynamically stable in the emergency room.  He has not had a fever while in the emergency room.  CT scan reveals acute cholecystitis with choledocholithiasis with a stone in the distal common bile duct.  He also has mild pancreatitis on CT scan.  WBC 16,500 hemoglobin 16.8 hematocrit 53.2 platelets 88,000.  Patient has chronic thrombocytopenia.  Sodium 133 potassium 4.2 chloride 96 bicarb 25 creatinine 2.00 BUN 44 calcium 9.4 alkaline phosphatase 171 AST 51 ALT 23 lipase 105 albumin 3.3 bilirubin 4.1.  T swab is negative.  Influenza a and B swab negative.  Urinalysis negative.  Patient discussed with surgery who recommended admission to medical service and GI consult for ERCP in the morning and surgery will consult and see patient in  the morning to determine operative time for cholecystectomy.  Hospitalist service has been asked to admit for further management  Review of Systems:  General: Denies fever, chills, weight loss, night sweats.  Denies dizziness.  Reports decreased appetite.  HENT: Denies ear pain or tinnitis. Denies nasal congestion.  Denies sore throat.  Denies difficulty swallowing Eyes: Denies blurry vision, pain in eye, drainage. Denies discoloration of eyes. Neck: Denies pain.  Denies swelling.  Denies pain with movement. Cardiovascular: Denies chest pain, palpitations.  Reports chronic leg edema.  Denies orthopnea Respiratory: Denies shortness of breath, cough.  Denies wheezing.  Denies sputum production Gastrointestinal: Reports  abdominal pain, diarrhea. Denies nausea, vomiting.  Denies melena.  Denies hematemesis. Musculoskeletal: Denies limitation of movement.  Denies deformity or swelling.  Denies arthralgias or myalgias. Genitourinary: Denies pelvic pain.  Denies urinary frequency or hesitancy.  Denies dysuria.  Skin: Denies rash.  Denies petechiae, purpura, ecchymosis. Neurological: Denies syncope.  Denies seizure activity.  Denies paresthesia.  Denies slurred speech, drooping face.  Denies visual change. Psychiatric: Denies depression, anxiety. Denies hallucinations.  Past Medical History:  Diagnosis Date   Acute on chronic congestive heart failure (HCC)    Acute on chronic systolic CHF (congestive heart failure) ([place]) 05/04/2021   Arthritis of left knee 12/02/2020   Bilateral leg edema 05/04/2017   CAD (coronary artery disease)    nonST elevated MI in Oct 2008 treated w/2 drug -eluting stents to the RCA and  mid LAD, EF 55%   CAD (coronary artery disease), native coronary artery 11/12/2018   Formatting of this note might be different from the original. S/p MI - Dr. Maurene Capes   CAD, NATIVE VESSEL 06/15/2009   Qualifier: Diagnosis of  By: Haroldine Laws, MD, Eileen Stanford    CAP (community acquired  pneumonia) 08/17/2016   Depressive disorder 11/12/2018   Diabetes mellitus due to underlying condition with unspecified complications ([place]) 0000000   DM (diabetes mellitus) ([place])    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   Erythrocytosis 06/01/2020   Essential hypertension 10/14/2007   Formatting of this note might be different from the original. Hypertension  10/1 IMO update   Gout, unspecified 10/14/2007   Overview:  Gout  10/1 IMO update  Formatting of this note might be different from the original. Gout  10/1 IMO update   History of colonic polyps 11/12/2018   Overview:  rpt colonoscopy 5/17; Dr. Haig Prophet of this note might be different from the original. rpt colonoscopy 5/17; Dr. Erlene Quan   History of kidney stones    History of nephrolithiasis 11/12/2018   History of smoking 11/12/2018   Overview:  quit 2000  Formatting of this note might be different from the original. quit 2000   HTN (hypertension)    Hyperlipidemia    HYPERTENSION, BENIGN 06/15/2009   Inguinal hernia without mention of obstruction or gangrene, recurrent unilateral or unspecified 11/12/2018   Ischemic cardiomyopathy 08/12/2020   Medication intolerance 11/12/2018   Mixed hyperlipidemia 11/12/2018   Old myocardial infarction 11/12/2018   Paroxysmal atrial fibrillation ([place]) 07/16/2020   Pre-operative cardiovascular examination 11/12/2018   Primary osteoarthritis of both knees 03/05/2017   Considering Zilretta injections. Good improvement with corticosteroid injections however short-lived. Mild improvement with Visco supplementation.   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 Diabetes Mellitus   Unilateral primary osteoarthritis, right knee 11/15/2018    Past Surgical History:  Procedure Laterality Date   BILATERAL KNEE ARTHROSCOPY     CORONARY ANGIOPLASTY WITH STENT PLACEMENT     LAPAROSCOPIC INGUINAL HERNIA REPAIR     LEFT HEART CATH AND CORONARY ANGIOGRAPHY N/A 05/05/2021   Procedure: LEFT HEART CATH AND CORONARY ANGIOGRAPHY;  Surgeon: Troy Sine, MD;  Location: [facility];  Service: Cardiovascular;  Laterality: N/A;   NOSE SURGERY     TOTAL KNEE ARTHROPLASTY Right 11/15/2018   Procedure: RIGHT TOTAL KNEE ARTHROPLASTY;  Surgeon: Mcarthur Rossetti, MD;  Location: [facility];  Service: Orthopedics;  Laterality: Right;   TOTAL KNEE ARTHROPLASTY Left 12/03/2020   Procedure: LEFT TOTAL KNEE ARTHROPLASTY;  Surgeon: Mcarthur Rossetti, MD;  Location: [facility];  Service: Orthopedics;  Laterality: Left;    Social History  reports that he has quit smoking. His smoking use included cigarettes. He has a 5.00 pack-year smoking history. He quit smokeless tobacco use about 52 years ago.  His smokeless tobacco use included snuff and chew. He reports that he does not drink alcohol and does not use drugs.  Allergies  Allergen Reactions   Allopurinol Other (See Comments)    Felt terrible   Aripiprazole Other (See Comments)    Felt terrible  Bupropion Other (See Comments)    Felt bad   Buspirone Other (See Comments)    Felt bad   Desvenlafaxine Other (See Comments)    Made him hyper   Duloxetine Other (See Comments)    Knocked him out   Escitalopram Other (See Comments)    Felt like crap   Fish Oil     unknown   Fluticasone Furoate Other (See Comments)    Unknown    Hydroxyzine Hcl Other (See Comments)    Knocked him out   Nortriptyline Other (See Comments)    Ineffective   Paroxetine Hcl Other (See Comments)    unknown   Ramipril Cough   Sertraline Other (See Comments)    "felt plugged in"   Venlafaxine Other (See Comments)     Felt bad   Vilazodone Other (See Comments)    "felt weird"    Family History  Problem Relation Age of Onset   Hypertension Mother    Cancer Mother    Heart attack Father    Hypertension Father    Diabetes Father    Hypertension Brother    Irregular heart beat Brother      Prior to Admission medications   Medication Sig Start Date End Date Taking? Authorizing Provider  albuterol (VENTOLIN HFA) 108 (90 Base) MCG/ACT inhaler Inhale 2 puffs into the lungs every 6 (six) hours as needed for shortness of breath. 05/03/21   [provider]  ALPRAZolam Duanne Moron) 0.25 MG tablet Take 0.25 mg by mouth at bedtime as needed for anxiety.  11/27/16   [provider]  apixaban (ELIQUIS) 5 MG TABS tablet Take 1 tablet (5 mg total) by mouth 2 (two) times daily. 05/06/21 07/04/21  Elodia Florence., MD  CIALIS 20 MG tablet Take 20 mg by mouth daily as needed for erectile dysfunction.  02/06/17   [provider]  colchicine 0.6 MG tablet Take 0.6 mg by mouth daily as needed for other. Gout flare up 05/02/21   [provider]  cyanocobalamin (,VITAMIN B-12,) 1000 MCG/ML injection Inject 1,000 mcg into the muscle every 30 (thirty) days.  05/12/16   [provider]  dapagliflozin propanediol (FARXIGA) 10 MG TABS tablet Take 1 tablet (10 mg total) by mouth at bedtime. 05/11/21   Katherine Roan, MD  fluticasone (FLONASE) 50 MCG/ACT nasal spray Place 1 spray into both nostrils daily as needed for allergies or rhinitis.    [provider]  furosemide (LASIX) 40 MG tablet Take 1 tablet (40 mg total) by mouth daily. 05/30/21   Katherine Roan, MD  LANTUS SOLOSTAR 100 UNIT/ML Solostar Pen Inject 21 Units into the skin at bedtime. 03/14/20   [provider]  levalbuterol (XOPENEX HFA) 45 MCG/ACT inhaler Inhale 1 puff into the lungs every 4 (four) hours as needed for wheezing.    [provider]  potassium chloride SA (KLOR-CON) 20 MEQ tablet Take 1  tablet (20 mEq total) by mouth daily. 05/30/21   Katherine Roan, MD  rosuvastatin (CRESTOR) 40 MG tablet Take 1 tablet (40 mg total) by mouth at bedtime. 05/30/21   Katherine Roan, MD  spironolactone (ALDACTONE) 25 MG tablet Take 1 tablet (25 mg total) by mouth at bedtime. 07/04/21   Bensimhon, Shaune Pascal, MD  traMADol (ULTRAM) 50 MG tablet Take 25 mg by mouth every 4 (four) hours as needed for pain or severe pain. 11/13/20   [provider]    Physical Exam: Vitals:   07/17/21  2230 07/17/21 2245 07/17/21 2345 07/18/21 0015  BP: 125/90 124/89 (!) 128/91 121/75  Pulse: 91 92 94 92  Resp:   16 16  Temp:      TempSrc:      SpO2: 96% 95% 95% 94%    Constitutional: NAD, calm, comfortable Vitals:   07/17/21 2230 07/17/21 2245 07/17/21 2345 07/18/21 0015  BP: 125/90 124/89 (!) 128/91 121/75  Pulse: 91 92 94 92  Resp:   16 16  Temp:      TempSrc:      SpO2: 96% 95% 95% 94%   General: WDWN, Alert and oriented x3.  Eyes: EOMI, PERRL, conjunctivae normal.  Sclera mildly icteric HENT:  [place]/AT, external ears normal.  Nares patent without epistasis. Mucous membranes are moist. Posterior pharynx clear Neck: Soft, normal range of motion, supple, no masses, Trachea midline Respiratory: clear to auscultation bilaterally, no wheezing, no crackles. Normal respiratory effort. No accessory muscle use.  Cardiovascular: Regular rate and rhythm, no murmurs / rubs / gallops. Has +1 lower extremity edema. 1+ pedal pulses.  Abdomen: Soft, RUQ tenderness, nondistended, no rebound or guarding.  No masses palpated. Bowel sounds normoactive. Positive Murphy's sign.  Musculoskeletal: FROM. no cyanosis. No joint deformity upper and lower extremities. Normal muscle tone. Healed anterior surgical incisions over knees bilaterally Skin: Warm, dry, intact no rashes, lesions, ulcers. No induration Neurologic: CN 2-12 grossly intact.  Normal speech.  Sensation intact to touch. Strength 5/5 in all extremities.    Psychiatric: Normal judgment and insight.  Normal mood.    Labs on Admission: I have personally reviewed following labs and imaging studies  CBC: Recent Labs  Lab 07/12/21 2118 07/17/21 1848  WBC 7.0 16.5*  NEUTROABS 5.5 14.5*  HGB 15.7 16.8  HCT 50.9 53.2*  MCV 83.2 82.2  PLT 101* 88*    Basic Metabolic Panel: Recent Labs  Lab 07/12/21 1340 07/12/21 2118 07/17/21 1848  NA 140 140 133*  K 3.9 4.1 4.2  CL 106 103 96*  CO2 '26 24 25  '$ GLUCOSE 134* 139* 128*  BUN 27* 29* 44*  CREATININE 1.77* 2.02* 2.00*  CALCIUM 9.3 9.7 9.4    GFR: Estimated Creatinine Clearance: 44.2 mL/min (A) (by C-G formula based on SCr of 2 mg/dL (H)).  Liver Function Tests: Recent Labs  Lab 07/12/21 2118 07/17/21 1848  AST 24 51*  ALT 17 23  ALKPHOS 128* 171*  BILITOT 2.7* 4.1*  PROT 7.1 7.0  ALBUMIN 4.1 3.3*    Urine analysis:    Component Value Date/Time   COLORURINE AMBER (A) 07/17/2021 1848   APPEARANCEUR CLOUDY (A) 07/17/2021 1848   LABSPEC 1.016 07/17/2021 1848   PHURINE 5.0 07/17/2021 1848   GLUCOSEU >=500 (A) 07/17/2021 1848   HGBUR MODERATE (A) 07/17/2021 1848   BILIRUBINUR NEGATIVE 07/17/2021 1848   KETONESUR NEGATIVE 07/17/2021 1848   PROTEINUR 100 (A) 07/17/2021 1848   NITRITE NEGATIVE 07/17/2021 1848   LEUKOCYTESUR NEGATIVE 07/17/2021 1848    Radiological Exams on Admission: CT ABDOMEN PELVIS WO CONTRAST  Result Date: 07/18/2021 CLINICAL DATA:  Abdominal pain x3 days. EXAM: CT ABDOMEN AND PELVIS WITHOUT CONTRAST TECHNIQUE: Multidetector CT imaging of the abdomen and pelvis was performed following the standard protocol without IV contrast. COMPARISON:  None. FINDINGS: Lower chest: Moderate severity atelectasis and/or infiltrate is seen within the posterior aspects of the bilateral lung bases. There are small bilateral pleural effusions. Hepatobiliary: No focal liver abnormality is seen. The gallbladder is markedly distended with diffuse gallbladder wall thickening  and pericholecystic inflammation. A 2 mm gallstone is suspected within the distal common bile duct (axial CT image 39, CT series 2). Pancreas: Mild inflammatory fat stranding is seen along the posterior aspect of the tail of the pancreas. The pancreatic parenchyma is otherwise normal in appearance. Spleen: Normal in size without focal abnormality. Adrenals/Urinary Tract: Adrenal glands are unremarkable. Kidneys are normal in size, without obstructing renal calculi or hydronephrosis. A 9 mm nonobstructing renal stone is seen within the mid right kidney. A 1.4 cm diameter cyst is seen within the anterior aspect of the mid right kidney. Bladder is unremarkable. Stomach/Bowel: Stomach is within normal limits. Appendix appears normal. No evidence of bowel wall thickening, distention, or inflammatory changes. Vascular/Lymphatic: Aortic atherosclerosis. No enlarged abdominal or pelvic lymph nodes. Reproductive: Prostate is unremarkable. Other: No abdominal wall hernia or abnormality. A small amount of abdominal and pelvic free fluid is seen with diffuse nonspecific mesenteric inflammatory fat stranding noted. Musculoskeletal: Degenerative changes are seen within the lumbar spine, most prominent at the level of L5-S1. IMPRESSION: 1. Findings consistent with acute cholecystitis with a tiny gallstone noted within the distal common bile duct. 2. Mild acute pancreatitis along the tail of the pancreas can not be excluded. Correlation with pancreatic enzymes is recommended. 3. 9 mm nonobstructing renal stone within the right kidney. Electronically Signed   By: Virgina Norfolk M.D.   On: 07/18/2021 00:02   US Abdomen Limited RUQ (LIVER/GB)  Result Date: 07/17/2021 CLINICAL DATA:  Right upper quadrant pain for 4 days. EXAM: ULTRASOUND ABDOMEN LIMITED RIGHT UPPER QUADRANT COMPARISON:  None. FINDINGS: Gallbladder: The gallbladder wall is thickened and mildly irregular. Sludge is identified. At least one 6 mm stone is noted.  Pericholecystic fluid is identified. No Murphy's sign reported. Common bile duct: Diameter: 3 mm Liver: No focal lesion identified. Within normal limits in parenchymal echogenicity. Portal vein is patent on color Doppler imaging with normal direction of blood flow towards the liver. Other: None. IMPRESSION: 1. The gallbladder is abnormal in appearance with a mildly irregular thickened wall. There is sludge in the gallbladder as well as at least 1 stone. Pericholecystic fluid is noted. No Murphy's sign. If the clinical picture remains ambiguous, recommend a HIDA scan. 2. A small amount of free fluid is seen in the abdomen. 3. No other acute abnormalities. Electronically Signed   By: Dorise Bullion III M.D.   On: 07/17/2021 21:01    EKG: Independently reviewed.  EKG shows junctional rhythm with right bundle branch block.  No acute ST elevation or depression.  QTc prolonged at 585  Assessment/Plan Principal Problem:   Acute cholecystitis Mr. Lowder is admitted to med surg floor with telemetry monitoring.  Started on Zosyn for antibiotic coverage.  IVF hydration with LR at 75 ml/hr Consult GI-Dr. Collene Mares for ERCP Surgery consulted by ER physician and will evaluate in am to determine when to perform cholecystectomy Pain control with Dilaudid IV  Active Problems:   Choledocholithiasis Patient is a distal common bile duct stone GI will evaluate in morning for ERCP    Pancreatitis Secondary to gallstone and diabetes.  Pain control with Dilaudid.  IV fluid hydration.    Essential hypertension Monitor blood pressure.  Patient currently not taking antihypertensives at home.    Diabetes mellitus type 2, uncomplicated  Continue Farxiga.  Blood sugars will be monitored every 6 hours and corrective insulin provided as needed for glycemic control.  Check hemoglobin A1c    CKD (chronic kidney disease) stage 3, GFR 30-59  ml/min  Stable.  Fluid hydration with LR.  Recheck electrolytes and renal function in  morning    Chronic systolic CHF (congestive heart failure)  Stable.  Continue Lasix, spironolactone and Farxiga.    Paroxysmal atrial fibrillation  Chronic.  Patient is on Eliquis which is held in anticipation of ERCP and possible cholecystectomy    Thrombocytopenia Chronic.  Monitor CBC    Leukocytosis Monitor CBC.  Patient on antibiotic.    Hyperbilirubinemia Secondary to common bile duct stone.  Recheck CMP and bilirubin level in morning    Prolonged QT interval Medications which can further prolong QT interval.  Monitor on telemetry    DVT prophylaxis: SCDs for DVT prophylaxis.  Patient is anticoagulant Eliquis but had last dose on the morning of July 17, 2021.  Anticoagulation held with anticipated EGD and possible cholecystectomy Code Status:   Full code Family Communication:  Diagnosis and plan discussed with patient.  Patient verbalized understanding and agrees with plan.  Questions answered.  Further recommendations to follow as clinically indicated Disposition Plan:   Patient is from:  Home  Anticipated DC to:  Home  Anticipated DC date:  Anticipate 2 midnight or more stay in the hospital to treat acute condition   Consults called:  Gastroenterology, Dr. Danna Hefty and secure chat sent by ER physician. Surgery consulted by ER physician  Admission status:  Inpatient  Yevonne Aline Kaliegh Willadsen MD [facility]  How to contact the [facility] Attending or Consulting provider [place] or covering provider during after hours [place], for this patient?   Check the care team in [facility] and look for a) attending/consulting [facility] provider listed and b) the [facility] team listed Log into [email or web address] and use [place]'s universal password to access. If you do not have the password, please contact the hospital operator. Locate the [facility] provider you are looking for under [facility] and page to a number that you can be directly reached. If you still have difficulty reaching the provider,  please page the [facility] (Director on Call) for the Hospitalists listed on amion for assistance.  07/18/2021, 1:10 AM

## 2021-07-18 NOTE — Consult Note (Addendum)
Cardiology Consultation:   Patient ID: [name] [name] MRN: [id number]; DOB: 1951/06/13  Admit date: 07/17/2021 Date of Consult: 07/18/2021  PCP:  Raeanne Gathers, MD   [facility] Providers Cardiologist:  Jenean Lindau, MD  Advanced Heart Failure:  Glori Bickers, MD       Patient Profile:   [name] [name] is a 70 y.o. male with a hx of CAD, NSTEMI 2009 with PCI of RCA with overlapping DES stents, HTN ,HLD, DM and chronic systolic CHF  who is being seen 07/18/2021 for the evaluation of CHF and pre-op eval for cholecystotomy  at the request of Dr. Starla Link.  History of Present Illness:   [name] with above hx and last seen by Dr. Haroldine [name] 05/2021.  Hx of CAD with NSTEMI 2009, DES overlapping to RCA and  planned LAD stent for a day later but do not see that this was done.  Last cath in 04/2021 with  left main has smooth 25% mid stenosis; there is calcification in the proximal LAD with narrowing of 25% followed by 40-50% mid stenosis and 30% diagonal stenosis; the circumflex ostium has 20% stenosis with 30% mid stenosis; the tandem RCA stents are widely patent with minimal intimal hyperplasia. There is 30% stenosis proximal to the stent.  There is 65 to 70% stenosis in the distal RCA beyond the acute margin before the PDA takeoff.  This was done for hospitalization for CHF and elevated troponin and drop in EF.   EF on that admit of 20-25%, with LV global hypokinesis, severe concentric LVH. G3 DD.  Rt and Lt atrial dilatation mild. EKG with RBBB and LPFB, 1st degree AV block. In June he had Mobitz 1, AV nodal agents held. Hx of PAF and on eliquis.  CHA2DS2VASc of 7 , CKD stage 3. Hx thrombocytopenia/erythrocytosis follows with hematology Dr. Marylou [name]. Has periodic phlebotomy.        Last week 07/13/21 seen in ER for SOB and weakness with lower ext edema. (Rec'd trigger pint injection of Rt knee with steroid 07/05/21)  BNP was 700, hs troponin 215 then 186. But have been similar on all  checks.  Small lt pl effusion on CXR.  Pt was discharged and to be seen in HF clinic.    Pt presented to ER on 8/28 for abd pain for 3 days of RLQ, decreased appetite.  On ER eval found to have acute cholecystitis with choledocholithiasis with a stone in the distal common bile duct.  He also has mild pancreatitis on CT scan.  Surgery consult obtained  Abd u/s:    IMPRESSION: 1. The gallbladder is abnormal in appearance with a mildly irregular thickened wall. There is sludge in the gallbladder as well as at least 1 stone. Pericholecystic fluid is noted. No Murphy's sign. If the clinical picture remains ambiguous, recommend a HIDA scan. 2. A small amount of free fluid is seen in the abdomen. 3. No other acute abnormalities  WBC 16,500, Hgb 16.8 plts 88,000 K+ 4.2 Cr 2.00  NEG Flu and COVID.    EKG:  The EKG was personally reviewed and demonstrates:  SR 90s with 1st degree AV block 240 ms and RBBB similar to old EKGs though ist was read as accelerated junctional.   Telemetry:  Telemetry was personally reviewed and demonstrates:  SR with 1st degree AV block and several beats of SVT  Last CXR 07/12/21 with cardiomegaly and sm lt pl effusion.  And on CT of abd lower chest Lower chest: Moderate  severity atelectasis and/or infiltrate is seen within the posterior aspects of the bilateral lung bases. There are small bilateral pleural effusions.   Pt he took last eliquis Saturday. - may need ERCP prior to cholecystectomy.  GI consult has been asked for as well.   Currently no chest pain and no SOB.    Past Medical History:  Diagnosis Date   Acute on chronic congestive heart failure (HCC)    Acute on chronic systolic CHF (congestive heart failure) ([place]) 05/04/2021   Arthritis of left knee 12/02/2020   Bilateral leg edema 05/04/2017   CAD (coronary artery disease)    nonST elevated MI in Oct 2008 treated w/2 drug -eluting stents to the RCA and  mid LAD, EF 55%   CAD (coronary artery disease),  native coronary artery 11/12/2018   Formatting of this note might be different from the original. 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 Hypertension  10/1 IMO update   Gout, unspecified 10/14/2007   Overview:  Gout  10/1 IMO update  Formatting of this note might be different from the original. Gout  10/1 IMO update   History of colonic polyps 11/12/2018   Overview:  rpt colonoscopy 5/17; [name] [name] of this note might be different from the original. rpt colonoscopy 5/17; [name] [name]   History of kidney stones    History of nephrolithiasis 11/12/2018   History of smoking 11/12/2018   Overview:  quit 2000  Formatting of this note might be different from the original. quit 2000   HTN (hypertension)    Hyperlipidemia    HYPERTENSION, BENIGN 06/15/2009   Inguinal hernia without mention of obstruction or gangrene, recurrent unilateral or unspecified 11/12/2018   Ischemic cardiomyopathy 08/12/2020   Medication intolerance 11/12/2018   Mixed hyperlipidemia 11/12/2018   Old myocardial infarction 11/12/2018   Paroxysmal atrial fibrillation ([name] [name]) 07/16/2020   Pre-operative cardiovascular examination 11/12/2018   Primary osteoarthritis of both knees 03/05/2017   Considering Zilretta injections. Good improvement with corticosteroid injections however short-lived. Mild improvement with Visco supplementation.   Renal insufficiency 08/12/2020   pt denies   Right carotid bruit 08/05/2014   Status post total left knee replacement  12/03/2020   Status post total right knee replacement 11/15/2018   Type II or unspecified type diabetes mellitus without mention of complication, not stated as uncontrolled 10/14/2007   Formatting of this note might be different from the original. Diabetes Mellitus   Unilateral primary osteoarthritis, right knee 11/15/2018    Past Surgical History:  Procedure Laterality Date   BILATERAL KNEE ARTHROSCOPY     CORONARY ANGIOPLASTY WITH STENT PLACEMENT     LAPAROSCOPIC INGUINAL HERNIA REPAIR     LEFT HEART CATH AND CORONARY ANGIOGRAPHY N/A 05/05/2021   Procedure: LEFT HEART CATH AND CORONARY ANGIOGRAPHY;  Surgeon: Troy Sine, MD;  Location: [facility];  Service: Cardiovascular;  Laterality: N/A;   NOSE SURGERY     TOTAL KNEE ARTHROPLASTY Right 11/15/2018   Procedure: RIGHT TOTAL KNEE ARTHROPLASTY;  Surgeon: Mcarthur Rossetti, MD;  Location: [facility];  Service: Orthopedics;  Laterality: Right;   TOTAL KNEE ARTHROPLASTY Left 12/03/2020   Procedure: LEFT TOTAL KNEE ARTHROPLASTY;  Surgeon: Mcarthur Rossetti, MD;  Location: [facility];  Service: Orthopedics;  Laterality: Left;     Home Medications:  Prior to Admission medications   Medication Sig Start Date End Date Taking? Authorizing Provider  ALPRAZolam Duanne Moron) 0.25 MG tablet Take 0.25 mg by mouth at bedtime as needed for anxiety.  11/27/16  Yes [provider]  apixaban (ELIQUIS) 5 MG TABS tablet Take 1 tablet (5 mg total) by mouth 2 (two) times daily. 05/06/21 07/18/21 Yes Elodia Florence., MD  colchicine 0.6 MG tablet Take 0.6 mg by mouth daily as needed for other. Gout flare up 05/02/21  Yes [provider]  cyanocobalamin (,VITAMIN B-12,) 1000 MCG/ML injection Inject 1,000 mcg into the muscle every 30 (thirty) days.  05/12/16  Yes [provider]  dapagliflozin propanediol (FARXIGA) 10 MG TABS tablet Take 1 tablet (10 mg total) by mouth at bedtime. 05/11/21  Yes Katherine Roan, MD  furosemide  (LASIX) 40 MG tablet Take 1 tablet (40 mg total) by mouth daily. 05/30/21  Yes Katherine Roan, MD  LANTUS SOLOSTAR 100 UNIT/ML Solostar Pen Inject 18 Units into the skin at bedtime. 03/14/20  Yes [provider]  levalbuterol (XOPENEX HFA) 45 MCG/ACT inhaler Inhale 1 puff into the lungs every 4 (four) hours as needed for wheezing.   Yes [provider]  potassium chloride SA (KLOR-CON) 20 MEQ tablet Take 1 tablet (20 mEq total) by mouth daily. 05/30/21  Yes Katherine Roan, MD  rosuvastatin (CRESTOR) 40 MG tablet Take 1 tablet (40 mg total) by mouth at bedtime. 05/30/21  Yes Katherine Roan, MD  spironolactone (ALDACTONE) 25 MG tablet Take 1 tablet (25 mg total) by mouth at bedtime. 07/04/21  Yes Bensimhon, Shaune Pascal, MD  traMADol (ULTRAM) 50 MG tablet Take 25 mg by mouth every 4 (four) hours as needed for pain or severe pain. 11/13/20  Yes [provider]  albuterol (VENTOLIN HFA) 108 (90 Base) MCG/ACT inhaler Inhale 2 puffs into the lungs every 6 (six) hours as needed for shortness of breath. 05/03/21   [provider]  CIALIS 20 MG tablet Take 20 mg by mouth daily as needed for erectile dysfunction.  Patient not taking: Reported on 07/18/2021 02/06/17   [provider]  fluticasone (FLONASE) 50 MCG/ACT nasal spray Place 1 spray into both nostrils daily as needed for allergies or rhinitis.    [provider]    Inpatient Medications: Scheduled Meds:  dapagliflozin propanediol  10 mg Oral QHS   furosemide  40 mg Oral Daily   potassium chloride SA  20 mEq Oral Daily   rosuvastatin  40 mg Oral QHS   spironolactone  25 mg Oral QHS   Continuous Infusions:  lactated ringers 75 mL/hr at 07/18/21 0322   piperacillin-tazobactam (ZOSYN)  IV 3.375 g (07/18/21 0805)   PRN Meds: acetaminophen **OR** acetaminophen, HYDROmorphone (DILAUDID) injection, insulin aspart, trimethobenzamide  Allergies:    Allergies  Allergen Reactions   Allopurinol  Other (See Comments)    Felt terrible   Aripiprazole Other (See Comments)    Felt terrible   Bupropion Other (See Comments)    Felt bad   Buspirone Other (See Comments)    Felt bad   Desvenlafaxine Other (See Comments)    Made him hyper   Duloxetine Other (See Comments)    Knocked  him out   Escitalopram Other (See Comments)    Felt like crap   Fish Oil     unknown   Fluticasone Furoate Other (See Comments)    Unknown    Hydroxyzine Hcl Other (See Comments)    Knocked him out   Nortriptyline Other (See Comments)    Ineffective   Paroxetine Hcl Other (See Comments)    unknown   Ramipril Cough   Sertraline Other (See Comments)    "felt plugged in"   Venlafaxine Other (See Comments)    Felt bad   Vilazodone Other (See Comments)    "felt weird"    Social History:   Social History   Socioeconomic History   Marital status: Widowed    Spouse name: Not on file   Number of children: Not on file   Years of education: Not on file   Highest education level: Not on file  Occupational History   Not on file  Tobacco Use   Smoking status: Former    Packs/day: 0.50    Years: 10.00    Pack years: 5.00    Types: Cigarettes   Smokeless tobacco: Former    Types: Snuff, Chew    Quit date: 11/20/1968   Tobacco comments:    in his 20's  Vaping Use   Vaping Use: Never used  Substance and Sexual Activity   Alcohol use: No   Drug use: No   Sexual activity: Yes  Other Topics Concern   Not on file  Social History Narrative   Not on file   Social Determinants of Health   Financial Resource Strain: Low Risk    Difficulty of Paying Living Expenses: Not hard at all  Food Insecurity: No Food Insecurity   Worried About Charity fundraiser in the Last Year: Never true   [place] in the Last Year: Never true  Transportation Needs: No Transportation Needs   Lack of Transportation (Medical): No   Lack of Transportation (Non-Medical): No  Physical Activity: Insufficiently  Active   Days of Exercise per Week: 3 days   Minutes of Exercise per Session: 10 min  Stress: Stress Concern Present   Feeling of Stress : Very much  Social Connections: Not on file  Intimate Partner Violence: Not on file    Family History:    Family History  Problem Relation Age of Onset   Hypertension Mother    Cancer Mother    Heart attack Father    Hypertension Father    Diabetes Father    Hypertension Brother    Irregular heart beat Brother      ROS:  Please see the history of present illness.  General:no colds or fevers, no weight changes Skin:no rashes or ulcers HEENT:no blurred vision, no congestion CV:see HPI PUL:see HPI GI:+ diarrhea no constipation or melena, no indigestion, + abd pain GU:no hematuria, no dysuria MS:no joint pain, no claudication Neuro:no syncope, no lightheadedness Endo:+ diabetes, no thyroid disease  All other ROS reviewed and negative.     Physical Exam/Data:   Vitals:   07/18/21 0227 07/18/21 0228 07/18/21 0458 07/18/21 0858  BP:  130/77 113/83 119/85  Pulse:  95 95 85  Resp:  20 (!) 22 20  Temp: 98.1 F (36.7 C)  (!) 97.5 F (36.4 C) (!) 97.2 F (36.2 C)  TempSrc:  Axillary Oral Oral  SpO2:   96% 96%  Weight:  101 kg    Height:  '6\' 1"'$  (1.854 m)  Intake/Output Summary (Last 24 hours) at 07/18/2021 1138 Last data filed at 07/18/2021 0845 Gross per 24 hour  Intake 96.79 ml  Output 350 ml  Net -253.21 ml   Last 3 Weights 07/18/2021 07/12/2021 07/04/2021  Weight (lbs) 222 lb 10.6 oz 222 lb 10.6 oz 222 lb 12.8 oz  Weight (kg) 101 kg 101 kg 101.061 kg     Body mass index is 29.38 kg/m.  General:  Well nourished, thin male, in no acute distress HEENT: normal Lymph: no adenopathy Neck: no JVD down to 20 degrees Endocrine:  No thryomegaly Vascular: No carotid bruits; pedal  pulses 1+ bilaterally  Cardiac:  normal S1, S2; RRR; no murmur gallup rub or click Lungs:  clear to diminished to auscultation bilaterally, no  wheezing, rhonchi or rales  Abd: soft, nontender, no hepatomegaly  Ext: trace edema Musculoskeletal:  No deformities, BUE and BLE strength normal and equal Skin: warm and dry  Neuro:  alert and oriented X 3 MAE follows commands, no focal abnormalities noted Psych:  Normal to flat affect   Relevant CV Studies: Echo 05/06/21 IMPRESSIONS     1. Left ventricular ejection fraction, by estimation, is 20 to 25%. The  left ventricle has severely decreased function. The left ventricle  demonstrates global hypokinesis. There is severe concentric left  ventricular hypertrophy. Left ventricular  diastolic parameters are consistent with Grade III diastolic dysfunction  (restrictive).   2. Right ventricular systolic function is mildly reduced. The right  ventricular size is normal. There is normal pulmonary artery systolic  pressure.   3. Left atrial size was mildly dilated.   4. Right atrial size was mild to moderately dilated.   5. The mitral valve is grossly normal. Mild mitral valve regurgitation.   6. The aortic valve is grossly normal. There is mild calcification of the  aortic valve. Aortic valve regurgitation is not visualized. No aortic  stenosis is present.   FINDINGS   Left Ventricle: Left ventricular ejection fraction, by estimation, is 20  to 25%. The left ventricle has severely decreased function. The left  ventricle demonstrates global hypokinesis. The left ventricular internal  cavity size was small. There is severe   concentric left ventricular hypertrophy. Left ventricular diastolic  parameters are consistent with Grade III diastolic dysfunction  (restrictive).   Right Ventricle: The right ventricular size is normal. Right vetricular  wall thickness was not well visualized. Right ventricular systolic  function is mildly reduced. There is normal pulmonary artery systolic  pressure. The tricuspid regurgitant velocity  is 2.57 m/s, and with an assumed right atrial pressure  of 8 mmHg, the  estimated right ventricular systolic pressure is 99991111 mmHg.   Left Atrium: Left atrial size was mildly dilated.   Right Atrium: Right atrial size was mild to moderately dilated.   Pericardium: There is no evidence of pericardial effusion.   Mitral Valve: The mitral valve is grossly normal. Mild mitral valve  regurgitation.   Tricuspid Valve: The tricuspid valve is normal in structure. Tricuspid  valve regurgitation is mild.   Aortic Valve: The aortic valve is grossly normal. There is mild  calcification of the aortic valve. Aortic valve regurgitation is not  visualized. No aortic stenosis is present.   Pulmonic Valve: The pulmonic valve was normal in structure. Pulmonic valve  regurgitation is not visualized.   Aorta: The aortic root and ascending aorta are structurally normal, with  no evidence of dilitation.   IAS/Shunts: The atrial septum is grossly normal.  Cardiac cath 05/05/21  Prox RCA to Mid RCA lesion is 5% stenosed. Prox RCA lesion is 25% stenosed. Dist RCA lesion is 70% stenosed. Mid LM to Dist LM lesion is 25% stenosed. Ost Cx lesion is 20% stenosed. Prox Cx to Mid Cx lesion is 30% stenosed. Prox LAD to Mid LAD lesion is 20% stenosed. 1st Diag lesion is 30% stenosed. Mid LAD lesion is 50% stenosed.   There is evidence for moderate coronary calcification involving predominantly the LAD and RCA.     The left main has smooth 25% mid stenosis; there is calcification in the proximal LAD with narrowing of 25% followed by 40-50% mid stenosis and 30% diagonal stenosis; the circumflex ostium has 20% stenosis with 30% mid stenosis; the tandem RCA stents are widely patent with minimal intimal hyperplasia. There is 30% stenosis proximal to the stent.  There is 65 to 70% stenosis in the distal RCA beyond the acute margin before the PDA takeoff.   LVEDP 19 mm Hg.     RECOMMENDATION: Hydration post cath with creatinine 1.92.  Recommend initiation of  antianginal  medical therapy.  Aggressive  lipid-lowering therapy; recommend titration of rosuvastatin to 40 mg.    Diagnostic Dominance: Right Intervention     Laboratory Data:  High Sensitivity Troponin:   Recent Labs  Lab 07/12/21 2118 07/12/21 2352  TROPONINIHS 215* 186*     Chemistry Recent Labs  Lab 07/12/21 2118 07/17/21 1848 07/18/21 0210  NA 140 133* 133*  K 4.1 4.2 4.0  CL 103 96* 96*  CO2 '24 25 24  '$ GLUCOSE 139* 128* 98  BUN 29* 44* 43*  CREATININE 2.02* 2.00* 2.01*  CALCIUM 9.7 9.4 9.3  GFRNONAA 35* 36* 35*  ANIONGAP '13 12 13    '$ Recent Labs  Lab 07/12/21 2118 07/17/21 1848 07/18/21 0210  PROT 7.1 7.0 6.4*  ALBUMIN 4.1 3.3* 3.1*  AST 24 51* 49*  ALT '17 23 21  '$ ALKPHOS 128* 171* 169*  BILITOT 2.7* 4.1* 3.7*   Hematology Recent Labs  Lab 07/12/21 2118 07/17/21 1848 07/18/21 0210  WBC 7.0 16.5* 14.2*  RBC 6.12* 6.47* 6.18*  HGB 15.7 16.8 16.2  HCT 50.9 53.2* 49.0  MCV 83.2 82.2 79.3*  MCH 25.7* 26.0 26.2  MCHC 30.8 31.6 33.1  RDW 20.8* 21.2* 21.0*  PLT 101* 88* 82*   BNP Recent Labs  Lab 07/12/21 2118  BNP 700.9*    DDimer No results for input(s): DDIMER in the last 168 hours.   Radiology/Studies:  CT ABDOMEN PELVIS WO CONTRAST  Result Date: 07/18/2021 CLINICAL DATA:  Abdominal pain x3 days. EXAM: CT ABDOMEN AND PELVIS WITHOUT CONTRAST TECHNIQUE: Multidetector CT imaging of the abdomen and pelvis was performed following the standard protocol without IV contrast. COMPARISON:  None. FINDINGS: Lower chest: Moderate severity atelectasis and/or infiltrate is seen within the posterior aspects of the bilateral lung bases. There are small bilateral pleural effusions. Hepatobiliary: No focal liver abnormality is seen. The gallbladder is markedly distended with diffuse gallbladder wall thickening and pericholecystic inflammation. A 2 mm gallstone is suspected within the distal common bile duct (axial CT image 39, CT series 2). Pancreas: Mild  inflammatory fat stranding is seen along the posterior aspect of the tail of the pancreas. The pancreatic parenchyma is otherwise normal in appearance. Spleen: Normal in size without focal abnormality. Adrenals/Urinary Tract: Adrenal glands are unremarkable. Kidneys are normal in size, without obstructing renal calculi or hydronephrosis. A 9 mm nonobstructing renal stone is seen within the mid right  kidney. A 1.4 cm diameter cyst is seen within the anterior aspect of the mid right kidney. Bladder is unremarkable. Stomach/Bowel: Stomach is within normal limits. Appendix appears normal. No evidence of bowel wall thickening, distention, or inflammatory changes. Vascular/Lymphatic: Aortic atherosclerosis. No enlarged abdominal or pelvic lymph nodes. Reproductive: Prostate is unremarkable. Other: No abdominal wall hernia or abnormality. A small amount of abdominal and pelvic free fluid is seen with diffuse nonspecific mesenteric inflammatory fat stranding noted. Musculoskeletal: Degenerative changes are seen within the lumbar spine, most prominent at the level of L5-S1. IMPRESSION: 1. Findings consistent with acute cholecystitis with a tiny gallstone noted within the distal common bile duct. 2. Mild acute pancreatitis along the tail of the pancreas can not be excluded. Correlation with pancreatic enzymes is recommended. 3. 9 mm nonobstructing renal stone within the right kidney. Electronically Signed   By: Virgina Norfolk M.D.   On: 07/18/2021 00:02   US Abdomen Limited RUQ (LIVER/GB)  Result Date: 07/17/2021 CLINICAL DATA:  Right upper quadrant pain for 4 days. EXAM: ULTRASOUND ABDOMEN LIMITED RIGHT UPPER QUADRANT COMPARISON:  None. FINDINGS: Gallbladder: The gallbladder wall is thickened and mildly irregular. Sludge is identified. At least one 6 mm stone is noted. Pericholecystic fluid is identified. No Murphy's sign reported. Common bile duct: Diameter: 3 mm Liver: No focal lesion identified. Within normal  limits in parenchymal echogenicity. Portal vein is patent on color Doppler imaging with normal direction of blood flow towards the liver. Other: None. IMPRESSION: 1. The gallbladder is abnormal in appearance with a mildly irregular thickened wall. There is sludge in the gallbladder as well as at least 1 stone. Pericholecystic fluid is noted. No Murphy's sign. If the clinical picture remains ambiguous, recommend a HIDA scan. 2. A small amount of free fluid is seen in the abdomen. 3. No other acute abnormalities. Electronically Signed   By: Dorise Bullion III M.D.   On: 07/17/2021 21:01     Assessment and Plan:   Pre-op eval for cholecystectomy with acute cholecystis- in pt with stable CAD but last EF  20-25% - will check PCXR  , elevated troponins a week ago seem to be chronic and flat with recent cardiac cath in June. Prior stents to RCA in 2009 were patent, he had non obstructive disease otherwise. With NICM would be mod to high risk but pt is stable. Will defer to Dr. Gardiner Rhyme for further comments.   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  Currently with no JVD will repeat CXR.  (HF meds spiro  25 mg daily , farxiga 10 mg daily, lasix 40 daily, K+ 20 meq daily  will check limited echo.  No ton ACE/ARB/ARN due to CKD PAF currently in SR with 1st degree AV block, one episode of SVT and does have PVCs.  Last dose of eliquis on Saturday.  Would resume when safe post op. Defer to Dr. Gardiner Rhyme if needs crossover but I do not see hx CVA--no bleeding on eliquis CAD with hx of NSTEMI in 2009 and overlapping stents to RCA, otherwise non obstructive disease with cath 04/2021. No acute chest pain to seem increase of disease.  In SR 1st degree AV block with PSVT short burst.  No BB due to 2nd degree type 1 block on last admit in 04/2021-No AV nodal blocking agents.   Acute chole per surgery/GI and IM CKD stage 3B baseline Cr 1.77 Erythrocytosis/thrombocytopenia per  hematology with Dr. Kem Kays  [name] [name] has rec'd periodic phlebotomy in past.  DM-2 per IM on farxiga   HLD on crestor continue  Has follow up AHF appt 07/26/21   Risk Assessment/Risk Scores:          For questions or updates, please contact [place] Please consult [email or web address] for contact info under    Signed, Cecilie Kicks, NP  07/18/2021 11:38 AM  Patient seen and examined.  Agree with above documentation.  Mr. Koppel is a 70 year old male with a history of CAD, chronic combined systolic and diastolic heart failure, hypertension, T2DM, paroxysmal atrial fibrillation, CKD stage IIIb who we are consulted by Dr. Starla Link for preop evaluation.  He had an NSTEMI in 2008 that was treated with DES x2 to RCA and mid LAD.  Normal EF at that time.  Also noted to have 40% left main stenosis.  He was admitted in 2012 with chest pain.  Underwent treadmill Myoview which showed mild inferoseptal perfusion defect.  He presented in June 2022 to ED with shortness of breath.  Echo showed EF 20 to [id number], grade 3 diastolic dysfunction, mildly reduced RV function.  Cath on 05/05/2021 showed moderate CAD but no significant obstruction requiring intervention (25% left main disease, 5% proximal to mid RCA, 25% proximal RCA, 70% distal RCA, 20% ostial LCx, 30% proximal to mid LCx, 20% proximal to mid LAD, 30% D1, 50% mid LAD).  Medical management recommended.  He was last seen in heart failure clinic by Allena Katz, NP on 05/30/2021.  Has been started on Lasix 40 mg daily, spironolactone 25 mg daily, Farxiga 10 mg daily.  Was not started on ACE/ARB/Arni due to renal function.  Was not started on beta-blocker due to AV block during recent admission.  He presented to the ED last night with abdominal pain.  Reports abdominal pain has progressively worsened over the last 3 days and also with intermittent diarrhea and nausea.  Denies any fevers or chills.  In the ED, vital signs notable for BP 140/96, pulse 92, SPO2 97% on  room air.  Labs notable for sodium 133, creatinine 2.0, albumin 3.1, AST 49, ALT 21, WBC 14.2, hemoglobin 16.2, platelets 82.  CT abdomen pelvis showed findings consistent with acute cholecystitis and mild acute pancreatitis.  Gastroenterology and general surgery consulted.  EKG shows normal sinus rhythm with PACs, right bundle branch block, QRS 162 ms, first-degree AV block.  On exam, patient is alert and oriented, regular rate and rhythm, no murmurs, lungs CTAB, no LE edema or JVD.  He denies any chest pain or shortness of breath.  Weight is 223 pounds (was 233 pounds at clinic visit on 05/30/2021).  In regards to his preop evaluation, his RCRI score is 5 (CHF, CAD, CKD, IDDM, intraperitoneal surgery), which corresponds to 15% 30-day risk of death, MI, or cardiac arrest.  Would classify as high risk for an intermediate risk surgery.  However he appears optimized in terms of his risk factors and do not think he would benefit from further risk stratification with noninvasive testing, as recently underwent cardiac catheterization with moderate disease but no targets for intervention.  He appears compensated in terms of his heart failure.  Will recheck limited echo to evaluate for improvement in systolic function since starting GDMT, but otherwise no further cardiac work-up recommended at this time.  Donato Heinz, MD

## 2021-07-18 NOTE — Consult Note (Addendum)
UNASSIGNED PATIENT Reason for Consult: Choledocholithiasis. Referring Physician: THP.  [name] [name] is an 70 y.o. male.  HPI: [name] [name] is a 70 year old white male with multiple medical problems as below who presented to the ER with a 3-day history of abdominal pain was in the right upper quadrant along with decreased appetite and poor oral intake.  He denies having any fevers or chills.  CT scan of the abdomen pelvis done in the emergency room revealed acute cholecystitis with choledocholithiasis and a stone in the distal CBD he also had mild pancreatitis on CT scan with a lipase of 105 bilirubin of 4.1 and white count of 16.5K.  An ERCP was requested.  Past Medical History:  Diagnosis Date   Acute on chronic congestive heart failure (HCC)    Acute on chronic systolic CHF (congestive heart failure) ([place]) 05/04/2021   Arthritis of left knee 12/02/2020   Bilateral leg edema 05/04/2017   CAD (coronary artery disease)    nonST elevated MI in Oct 2008 treated w/2 drug -eluting stents to the RCA and  mid LAD, EF 55%   CAD (coronary artery disease), native coronary artery 11/12/2018   Formatting of this note might be different from the original. S/p MI - Dr. 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 Hypertension  10/1 IMO update   Gout, unspecified 10/14/2007   Overview:  Gout  10/1 IMO update  Formatting of this note might be different from the original. Gout  10/1 IMO update   History of colonic polyps 11/12/2018   Overview:  rpt  colonoscopy 5/17; Dr. Haig Prophet of this note might be different from the original. rpt colonoscopy 5/17; Dr. Erlene Quan   History of kidney stones    History of nephrolithiasis 11/12/2018   History of smoking 11/12/2018   Overview:  quit 2000  Formatting of this note might be different from the original. quit 2000   HTN (hypertension)    Hyperlipidemia    HYPERTENSION, BENIGN 06/15/2009   Inguinal hernia without mention of obstruction or gangrene, recurrent unilateral or unspecified 11/12/2018   Ischemic cardiomyopathy 08/12/2020   Medication intolerance 11/12/2018   Mixed hyperlipidemia 11/12/2018   Old myocardial infarction 11/12/2018   Paroxysmal atrial fibrillation ([place]) 07/16/2020   Pre-operative cardiovascular examination 11/12/2018   Primary osteoarthritis of both knees 03/05/2017   Considering Zilretta injections. Good improvement with corticosteroid injections however short-lived. Mild improvement with Visco supplementation.   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 Diabetes Mellitus   Unilateral primary osteoarthritis, right knee 11/15/2018   Past Surgical History:  Procedure Laterality Date   BILATERAL KNEE ARTHROSCOPY     CORONARY ANGIOPLASTY WITH STENT PLACEMENT     LAPAROSCOPIC INGUINAL HERNIA REPAIR     LEFT HEART  CATH AND CORONARY ANGIOGRAPHY N/A 05/05/2021   Procedure: LEFT HEART CATH AND CORONARY ANGIOGRAPHY;  Surgeon: Troy Sine, MD;  Location: [facility];  Service: Cardiovascular;  Laterality: N/A;   NOSE SURGERY     TOTAL KNEE ARTHROPLASTY Right 11/15/2018   Procedure: RIGHT TOTAL KNEE ARTHROPLASTY;  Surgeon: Mcarthur Rossetti, MD;  Location: [facility];  Service: Orthopedics;  Laterality: Right;   TOTAL KNEE ARTHROPLASTY Left 12/03/2020   Procedure: LEFT TOTAL KNEE ARTHROPLASTY;  Surgeon: Mcarthur Rossetti, MD;  Location: [facility];  Service: Orthopedics;  Laterality: Left;   Family History  Problem Relation Age of Onset   Hypertension Mother    Cancer Mother    Heart attack Father    Hypertension Father    Diabetes Father    Hypertension Brother    Irregular heart beat Brother    Social History:  reports that he has quit smoking. His smoking use included cigarettes. He has a 5.00 pack-year smoking history. He quit smokeless tobacco use about 52 years ago.  His smokeless tobacco use included snuff and chew. He reports that he does not drink alcohol and does not use drugs.  Allergies:  Allergies  Allergen Reactions   Allopurinol Other (See Comments)    Felt terrible   Aripiprazole Other (See Comments)    Felt terrible   Bupropion Other (See Comments)    Felt bad   Buspirone Other (See Comments)    Felt bad   Desvenlafaxine Other (See Comments)    Made him hyper   Duloxetine Other (See Comments)    Knocked him out   Escitalopram Other (See Comments)    Felt like crap   Fish Oil     unknown   Fluticasone Furoate Other (See Comments)    Unknown    Hydroxyzine Hcl Other (See Comments)    Knocked him out   Nortriptyline Other (See Comments)    Ineffective   Paroxetine Hcl Other (See Comments)    unknown   Ramipril Cough   Sertraline Other (See Comments)    "felt plugged in"   Venlafaxine Other (See Comments)    Felt bad   Vilazodone Other (See Comments)    "felt weird"   Medications: I have reviewed the patient's current medications. Prior to Admission:  Medications Prior to Admission  Medication Sig Dispense Refill Last Dose   ALPRAZolam (XANAX) 0.25 MG tablet Take 0.25 mg by mouth at bedtime as needed for anxiety.       apixaban (ELIQUIS) 5 MG TABS tablet Take 1 tablet (5 mg  total) by mouth 2 (two) times daily. 60 tablet 0 07/16/2021 at pm   colchicine 0.6 MG tablet Take 0.6 mg by mouth daily as needed for other. Gout flare up      cyanocobalamin (,VITAMIN B-12,) 1000 MCG/ML injection Inject 1,000 mcg into the muscle every 30 (thirty) days.    Past Month   dapagliflozin propanediol (FARXIGA) 10 MG TABS tablet Take 1 tablet (10 mg total) by mouth at bedtime. 30 tablet 11 07/16/2021   furosemide (LASIX) 40 MG tablet Take 1 tablet (40 mg total) by mouth daily. 30 tablet 3 07/16/2021   LANTUS SOLOSTAR 100 UNIT/ML Solostar Pen Inject 18 Units into the skin at bedtime.   07/16/2021 at hs   levalbuterol (XOPENEX HFA) 45 MCG/ACT inhaler Inhale 1 puff into the lungs every 4 (four) hours as needed for wheezing.      potassium chloride SA (KLOR-CON) 20 MEQ tablet Take  1 tablet (20 mEq total) by mouth daily. 30 tablet 3 07/16/2021 at hs   rosuvastatin (CRESTOR) 40 MG tablet Take 1 tablet (40 mg total) by mouth at bedtime. 30 tablet 3 07/16/2021 at hs   spironolactone (ALDACTONE) 25 MG tablet Take 1 tablet (25 mg total) by mouth at bedtime. 90 tablet 3 07/16/2021 at hs   traMADol (ULTRAM) 50 MG tablet Take 25 mg by mouth every 4 (four) hours as needed for pain or severe pain.   07/17/2021   albuterol (VENTOLIN HFA) 108 (90 Base) MCG/ACT inhaler Inhale 2 puffs into the lungs every 6 (six) hours as needed for shortness of breath.      CIALIS 20 MG tablet Take 20 mg by mouth daily as needed for erectile dysfunction.  (Patient not taking: Reported on 07/18/2021)   Not Taking   fluticasone (FLONASE) 50 MCG/ACT nasal spray Place 1 spray into both nostrils daily as needed for allergies or rhinitis.      Scheduled:  dapagliflozin propanediol  10 mg Oral QHS   furosemide  40 mg Oral Daily   potassium chloride SA  20 mEq Oral Daily   rosuvastatin  40 mg Oral QHS   spironolactone  25 mg Oral QHS   Continuous:  lactated ringers 75 mL/hr at 07/18/21 1539   piperacillin-tazobactam (ZOSYN)  IV  3.375 g (07/18/21 1545)   [id number] **OR** acetaminophen, HYDROmorphone (DILAUDID) injection, insulin aspart, trimethobenzamide  Results for orders placed or performed during the hospital encounter of 07/17/21 (from the past 48 hour(s))  Comprehensive metabolic panel     Status: Abnormal   Collection Time: 07/17/21  6:48 PM  Result Value Ref Range   Sodium 133 (L) 135 - 145 mmol/L   Potassium 4.2 3.5 - 5.1 mmol/L   Chloride 96 (L) 98 - 111 mmol/L   CO2 25 22 - 32 mmol/L   Glucose, Bld 128 (H) 70 - 99 mg/dL    Comment: Glucose reference range applies only to samples taken after fasting for at least 8 hours.   BUN 44 (H) 8 - 23 mg/dL   Creatinine, Ser 2.00 (H) 0.61 - 1.24 mg/dL   Calcium 9.4 8.9 - 10.3 mg/dL   Total Protein 7.0 6.5 - 8.1 g/dL   Albumin 3.3 (L) 3.5 - 5.0 g/dL   AST 51 (H) 15 - 41 U/L   ALT 23 0 - 44 U/L   Alkaline Phosphatase 171 (H) 38 - 126 U/L   Total Bilirubin 4.1 (H) 0.3 - 1.2 mg/dL   GFR, Estimated 36 (L) >60 mL/min    Comment: (NOTE) Calculated using the CKD-EPI Creatinine Equation (2021)    Anion gap 12 5 - 15    Comment: Performed at [facility], [place] [address], [place], [place] [address]  CBC with Differential     Status: Abnormal   Collection Time: 07/17/21  6:48 PM  Result Value Ref Range   WBC 16.5 (H) 4.0 - 10.5 K/uL   RBC 6.47 (H) 4.22 - 5.81 MIL/uL   Hemoglobin 16.8 13.0 - 17.0 g/dL   HCT 53.2 (H) 39.0 - 52.0 %   MCV 82.2 80.0 - 100.0 fL   MCH 26.0 26.0 - 34.0 pg   MCHC 31.6 30.0 - 36.0 g/dL   RDW 21.2 (H) 11.5 - 15.5 %   Platelets 88 (L) 150 - 400 K/uL    Comment: SPECIMEN CHECKED FOR CLOTS Immature Platelet Fraction may be clinically indicated, consider ordering this additional test [id number] PLATELET COUNT  CONFIRMED BY SMEAR    nRBC 0.0 0.0 - 0.2 %   Neutrophils Relative % 88 %   Neutro Abs 14.5 (H) 1.7 - 7.7 K/uL   Lymphocytes Relative 3 %   Lymphs Abs 0.4 (L) 0.7 - 4.0 K/uL   Monocytes Relative 8 %    Monocytes Absolute 1.4 (H) 0.1 - 1.0 K/uL   Eosinophils Relative 0 %   Eosinophils Absolute 0.0 0.0 - 0.5 K/uL   Basophils Relative 0 %   Basophils Absolute 0.0 0.0 - 0.1 K/uL   Immature Granulocytes 1 %   Abs Immature Granulocytes 0.10 (H) 0.00 - 0.07 K/uL    Comment: Performed at [facility], [place] [address], [place], [place] [address]  Lipase, blood     Status: Abnormal   Collection Time: 07/17/21  6:48 PM  Result Value Ref Range   Lipase 105 (H) 11 - 51 U/L    Comment: Performed at [facility], [place] [address] [place], [place] [address]  Urinalysis, Routine w reflex microscopic Urine, Clean Catch     Status: Abnormal   Collection Time: 07/17/21  6:48 PM  Result Value Ref Range   Color, Urine AMBER (A) YELLOW    Comment: BIOCHEMICALS MAY BE AFFECTED BY COLOR   APPearance CLOUDY (A) CLEAR   Specific Gravity, Urine 1.016 1.005 - 1.030   pH 5.0 5.0 - 8.0   Glucose, UA >=500 (A) NEGATIVE mg/dL   Hgb urine dipstick MODERATE (A) NEGATIVE   Bilirubin Urine NEGATIVE NEGATIVE   Ketones, ur NEGATIVE NEGATIVE mg/dL   Protein, ur 100 (A) NEGATIVE mg/dL   Nitrite NEGATIVE NEGATIVE   Leukocytes,Ua NEGATIVE NEGATIVE   RBC / HPF >50 (H) 0 - 5 RBC/hpf   WBC, UA 6-10 0 - 5 WBC/hpf   Bacteria, UA RARE (A) NONE SEEN   Squamous Epithelial / LPF 0-5 0 - 5   Mucus PRESENT     Comment: Performed at [facility], [place] [address] [place],  [address]  Resp Panel by RT-PCR (Flu A&B, Covid) Nasopharyngeal Swab     Status: None   Collection Time: 07/17/21  8:01 PM   Specimen: Nasopharyngeal Swab; Nasopharyngeal(NP) swabs in vial transport medium  Result Value Ref Range   SARS Coronavirus 2 by RT PCR NEGATIVE NEGATIVE    Comment: (NOTE) SARS-CoV-2 target nucleic acids are NOT DETECTED.  The SARS-CoV-2 RNA is generally detectable in upper respiratory specimens during the acute phase of infection. The lowest concentration of SARS-CoV-2  viral copies this assay can detect is 138 copies/mL. A negative result does not preclude SARS-Cov-2 infection and should not be used as the sole basis for treatment or other patient management decisions. A negative result may occur with  improper specimen collection/handling, submission of specimen other than nasopharyngeal swab, presence of viral mutation(s) within the areas targeted by this assay, and inadequate number of viral copies(<138 copies/mL). A negative result must be combined with clinical observations, patient history, and epidemiological information. The expected result is Negative.  Fact Sheet for Patients:  EntrepreneurPulse.com.au  Fact Sheet for Healthcare Providers:  IncredibleEmployment.be  This test is no t yet approved or cleared by the [place] FDA and  has been authorized for detection and/or diagnosis of SARS-CoV-2 by FDA under an Emergency Use Authorization (EUA). This EUA will remain  in effect (meaning this test can be used) for the duration of the COVID-19 declaration under Section 564(b)(1) of the Act, 21 U.S.C.section 360bbb-3(b)(1), unless the  authorization is terminated  or revoked sooner.       Influenza A by PCR NEGATIVE NEGATIVE   Influenza B by PCR NEGATIVE NEGATIVE    Comment: (NOTE) The Xpert Xpress SARS-CoV-2/FLU/RSV plus assay is intended as an aid in the diagnosis of influenza from Nasopharyngeal swab specimens and should not be used as a sole basis for treatment. Nasal washings and aspirates are unacceptable for Xpert Xpress SARS-CoV-2/FLU/RSV testing.  Fact Sheet for Patients: EntrepreneurPulse.com.au  Fact Sheet for Healthcare Providers: IncredibleEmployment.be  This test is not yet approved or cleared by the [place] FDA and has been authorized for detection and/or diagnosis of SARS-CoV-2 by FDA under an Emergency Use Authorization (EUA). This EUA  will remain in effect (meaning this test can be used) for the duration of the COVID-19 declaration under Section 564(b)(1) of the Act, 21 U.S.C. section 360bbb-3(b)(1), unless the authorization is terminated or revoked.  Performed at [facility], [place] [address], [place], [place] [address]   Comprehensive metabolic panel     Status: Abnormal   Collection Time: 07/18/21  2:10 AM  Result Value Ref Range   Sodium 133 (L) 135 - 145 mmol/L   Potassium 4.0 3.5 - 5.1 mmol/L   Chloride 96 (L) 98 - 111 mmol/L   CO2 24 22 - 32 mmol/L   Glucose, Bld 98 70 - 99 mg/dL    Comment: Glucose reference range applies only to samples taken after fasting for at least 8 hours.   BUN 43 (H) 8 - 23 mg/dL   Creatinine, Ser 2.01 (H) 0.61 - 1.24 mg/dL   Calcium 9.3 8.9 - 10.3 mg/dL   Total Protein 6.4 (L) 6.5 - 8.1 g/dL   Albumin 3.1 (L) 3.5 - 5.0 g/dL   AST 49 (H) 15 - 41 U/L   ALT 21 0 - 44 U/L   Alkaline Phosphatase 169 (H) 38 - 126 U/L   Total Bilirubin 3.7 (H) 0.3 - 1.2 mg/dL   GFR, Estimated 35 (L) >60 mL/min    Comment: (NOTE) Calculated using the CKD-EPI Creatinine Equation (2021)    Anion gap 13 5 - 15    Comment: Performed at [facility], [place] [address], [place], [place] [address]  CBC     Status: Abnormal   Collection Time: 07/18/21  2:10 AM  Result Value Ref Range   WBC 14.2 (H) 4.0 - 10.5 K/uL   RBC 6.18 (H) 4.22 - 5.81 MIL/uL   Hemoglobin 16.2 13.0 - 17.0 g/dL   HCT 49.0 39.0 - 52.0 %   MCV 79.3 (L) 80.0 - 100.0 fL   MCH 26.2 26.0 - 34.0 pg   MCHC 33.1 30.0 - 36.0 g/dL   RDW 21.0 (H) 11.5 - 15.5 %   Platelets 82 (L) 150 - 400 K/uL    Comment: Immature Platelet Fraction may be clinically indicated, consider ordering this additional test [id number]    nRBC 0.0 0.0 - 0.2 %    Comment: Performed at [facility], [place] [address] [place], [place] [address]    CT ABDOMEN PELVIS WO CONTRAST  Result Date: 07/18/2021 CLINICAL  DATA:  Abdominal pain x3 days. EXAM: CT ABDOMEN AND PELVIS WITHOUT CONTRAST TECHNIQUE: Multidetector CT imaging of the abdomen and pelvis was performed following the standard protocol without IV contrast. COMPARISON:  None. FINDINGS: Lower chest: Moderate severity atelectasis and/or infiltrate is seen within the posterior aspects of the bilateral lung bases. There are small bilateral pleural effusions. Hepatobiliary:  No focal liver abnormality is seen. The gallbladder is markedly distended with diffuse gallbladder wall thickening and pericholecystic inflammation. A 2 mm gallstone is suspected within the distal common bile duct (axial CT image 39, CT series 2). Pancreas: Mild inflammatory fat stranding is seen along the posterior aspect of the tail of the pancreas. The pancreatic parenchyma is otherwise normal in appearance. Spleen: Normal in size without focal abnormality. Adrenals/Urinary Tract: Adrenal glands are unremarkable. Kidneys are normal in size, without obstructing renal calculi or hydronephrosis. A 9 mm nonobstructing renal stone is seen within the mid right kidney. A 1.4 cm diameter cyst is seen within the anterior aspect of the mid right kidney. Bladder is unremarkable. Stomach/Bowel: Stomach is within normal limits. Appendix appears normal. No evidence of bowel wall thickening, distention, or inflammatory changes. Vascular/Lymphatic: Aortic atherosclerosis. No enlarged abdominal or pelvic lymph nodes. Reproductive: Prostate is unremarkable. Other: No abdominal wall hernia or abnormality. A small amount of abdominal and pelvic free fluid is seen with diffuse nonspecific mesenteric inflammatory fat stranding noted. Musculoskeletal: Degenerative changes are seen within the lumbar spine, most prominent at the level of L5-S1. IMPRESSION: 1. Findings consistent with acute cholecystitis with a tiny gallstone noted within the distal common bile duct. 2. Mild acute pancreatitis along the tail of the pancreas  can not be excluded. Correlation with pancreatic enzymes is recommended. 3. 9 mm nonobstructing renal stone within the right kidney. Electronically Signed   By: Virgina Norfolk M.D.   On: 07/18/2021 00:02   US Abdomen Limited RUQ (LIVER/GB)  Result Date: 07/17/2021 CLINICAL DATA:  Right upper quadrant pain for 4 days. EXAM: ULTRASOUND ABDOMEN LIMITED RIGHT UPPER QUADRANT COMPARISON:  None. FINDINGS: Gallbladder: The gallbladder wall is thickened and mildly irregular. Sludge is identified. At least one 6 mm stone is noted. Pericholecystic fluid is identified. No Murphy's sign reported. Common bile duct: Diameter: 3 mm Liver: No focal lesion identified. Within normal limits in parenchymal echogenicity. Portal vein is patent on color Doppler imaging with normal direction of blood flow towards the liver. Other: None. IMPRESSION: 1. The gallbladder is abnormal in appearance with a mildly irregular thickened wall. There is sludge in the gallbladder as well as at least 1 stone. Pericholecystic fluid is noted. No Murphy's sign. If the clinical picture remains ambiguous, recommend a HIDA scan. 2. A small amount of free fluid is seen in the abdomen. 3. No other acute abnormalities. Electronically Signed   By: Dorise Bullion III M.D.   On: 07/17/2021 21:01    Review of Systems  Constitutional:  Positive for appetite change and fatigue. Negative for activity change, chills, diaphoresis, fever and unexpected weight change.  HENT: Negative.    Eyes: Negative.   Respiratory: Negative.    Gastrointestinal:  Positive for abdominal pain, diarrhea and nausea. Negative for anal bleeding, blood in stool and constipation.  Endocrine: Negative.   Genitourinary: Negative.   Skin: Negative.   Allergic/Immunologic: Negative.   Neurological:  Positive for weakness.  Hematological: Negative.   Psychiatric/Behavioral: Negative.    Blood pressure 113/83, pulse 95, temperature (!) 97.5 F (36.4 C), temperature source Oral,  resp. rate (!) 22, height '6\' 1"'$  (1.854 m), weight 101 kg, SpO2 96 %. Physical Exam Constitutional:      Appearance: He is well-developed.  HENT:     Head: Normocephalic and atraumatic.  Cardiovascular:     Rate and Rhythm: Normal rate and regular rhythm.  Pulmonary:     Effort: Pulmonary effort is normal.  Breath sounds: Normal breath sounds.  Abdominal:     General: There is no distension.     Palpations: Abdomen is soft.     Tenderness: There is abdominal tenderness in the right upper quadrant and periumbilical area. There is guarding. There is no rebound.  Skin:    General: Skin is warm and dry.  Neurological:     Mental Status: He is alert and oriented to person, place, and time.  Psychiatric:        Mood and Affect: Mood normal.        Behavior: Behavior normal.  . Assessment/Plan: 1) Acute cholecystitis with choledocholithiasis and mild pancreatitis with hyperbilirubinemia/abnormal CT scan-total bili of 4.1 and WBC count of 16.5K-on Zosyn. ERCP scheduled for tomorrow 2) AODM/HTN/CKD. 3) Chronic systolic CHF. 4) Chronic atrial fibrillation. Juanita Craver 07/18/2021, 6:53 AM

## 2021-07-18 NOTE — Progress Notes (Signed)
Pt transferred to unit from [facility], received report from [place], [place]. pT oriented to room and callbell with no complications. Unsteady gait with 2 assist to bedside. IS at bedside and SCD's ordered.

## 2021-07-18 NOTE — Progress Notes (Signed)
Hypoglycemic Event  CBG: 61  Treatment: D50 25 mL (12.5 gm)  Symptoms: None  Follow-up CBG: Time:1324 CBG Result:113  Possible Reasons for Event: Inadequate meal intake  Comments/MD notified: Dr. Starla Link notified      Marin Shutter

## 2021-07-18 NOTE — Plan of Care (Signed)

## 2021-07-19 ENCOUNTER — Encounter (HOSPITAL_COMMUNITY): Payer: Self-pay | Admitting: Family Medicine

## 2021-07-19 ENCOUNTER — Inpatient Hospital Stay (HOSPITAL_COMMUNITY): Payer: Medicare Other

## 2021-07-19 ENCOUNTER — Inpatient Hospital Stay (HOSPITAL_COMMUNITY): Payer: Medicare Other | Admitting: Anesthesiology

## 2021-07-19 ENCOUNTER — Encounter (HOSPITAL_COMMUNITY)
Admission: EM | Disposition: A | Discharge status: Skilled Nursing Facility | Payer: Self-pay | Source: Home / Self Care | Attending: Internal Medicine

## 2021-07-19 DIAGNOSIS — K810 Acute cholecystitis: Secondary | ICD-10-CM | POA: Diagnosis not present

## 2021-07-19 DIAGNOSIS — I5022 Chronic systolic (congestive) heart failure: Secondary | ICD-10-CM | POA: Diagnosis not present

## 2021-07-19 DIAGNOSIS — R9431 Abnormal electrocardiogram [ECG] [EKG]: Secondary | ICD-10-CM

## 2021-07-19 DIAGNOSIS — K8050 Calculus of bile duct without cholangitis or cholecystitis without obstruction: Secondary | ICD-10-CM | POA: Diagnosis not present

## 2021-07-19 DIAGNOSIS — N1832 Chronic kidney disease, stage 3b: Secondary | ICD-10-CM | POA: Diagnosis not present

## 2021-07-19 HISTORY — PX: ERCP: SHX5425

## 2021-07-19 HISTORY — PX: SPHINCTEROTOMY: SHX5544

## 2021-07-19 HISTORY — PX: REMOVAL OF STONES: SHX5545

## 2021-07-19 LAB — URINE CULTURE: Culture: NO GROWTH

## 2021-07-19 LAB — GLUCOSE, CAPILLARY
Glucose-Capillary: 140 mg/dL — ABNORMAL HIGH (ref 70–99)
Glucose-Capillary: 105 mg/dL — ABNORMAL HIGH (ref 70–99)
Glucose-Capillary: 128 mg/dL — ABNORMAL HIGH (ref 70–99)
Glucose-Capillary: 244 mg/dL — ABNORMAL HIGH (ref 70–99)

## 2021-07-19 LAB — CBC WITH DIFFERENTIAL/PLATELET
Abs Immature Granulocytes: 0.05 10*3/uL (ref 0.00–0.07)
Basophils Absolute: 0 10*3/uL (ref 0.0–0.1)
Basophils Relative: 0 %
Eosinophils Absolute: 0 10*3/uL (ref 0.0–0.5)
Eosinophils Relative: 0 %
HCT: 50.7 % (ref 39.0–52.0)
Hemoglobin: 16.1 g/dL (ref 13.0–17.0)
Immature Granulocytes: 0 %
Lymphocytes Relative: 4 %
Lymphs Abs: 0.5 10*3/uL — ABNORMAL LOW (ref 0.7–4.0)
MCH: 25.4 pg — ABNORMAL LOW (ref 26.0–34.0)
MCHC: 31.8 g/dL (ref 30.0–36.0)
MCV: 80.1 fL (ref 80.0–100.0)
Monocytes Absolute: 1.2 10*3/uL — ABNORMAL HIGH (ref 0.1–1.0)
Monocytes Relative: 9 %
Neutro Abs: 10.5 10*3/uL — ABNORMAL HIGH (ref 1.7–7.7)
Neutrophils Relative %: 87 %
Platelets: 86 10*3/uL — ABNORMAL LOW (ref 150–400)
RBC: 6.33 MIL/uL — ABNORMAL HIGH (ref 4.22–5.81)
RDW: 21.6 % — ABNORMAL HIGH (ref 11.5–15.5)
WBC: 12.3 10*3/uL — ABNORMAL HIGH (ref 4.0–10.5)
nRBC: 0 % (ref 0.0–0.2)

## 2021-07-19 LAB — LIPASE, BLOOD: Lipase: 40 U/L (ref 11–51)

## 2021-07-19 LAB — COMPREHENSIVE METABOLIC PANEL
ALT: 24 U/L (ref 0–44)
AST: 53 U/L — ABNORMAL HIGH (ref 15–41)
Albumin: 2.8 g/dL — ABNORMAL LOW (ref 3.5–5.0)
Alkaline Phosphatase: 210 U/L — ABNORMAL HIGH (ref 38–126)
Anion gap: 9 (ref 5–15)
BUN: 40 mg/dL — ABNORMAL HIGH (ref 8–23)
CO2: 25 mmol/L (ref 22–32)
Calcium: 9.3 mg/dL (ref 8.9–10.3)
Chloride: 101 mmol/L (ref 98–111)
Creatinine, Ser: 2.12 mg/dL — ABNORMAL HIGH (ref 0.61–1.24)
GFR, Estimated: 33 mL/min — ABNORMAL LOW (ref >60)
Glucose, Bld: 121 mg/dL — ABNORMAL HIGH (ref 70–99)
Potassium: 4.2 mmol/L (ref 3.5–5.1)
Sodium: 135 mmol/L (ref 135–145)
Total Bilirubin: 3.4 mg/dL — ABNORMAL HIGH (ref 0.3–1.2)
Total Protein: 6.4 g/dL — ABNORMAL LOW (ref 6.5–8.1)

## 2021-07-19 LAB — ECHOCARDIOGRAM LIMITED
Calc EF: 26.9 %
Height: 73 in
S' Lateral: 4.4 cm
Single Plane A2C EF: 21.2 %
Single Plane A4C EF: 30.9 %
Weight: 3506.2 oz

## 2021-07-19 LAB — MAGNESIUM: Magnesium: 2.2 mg/dL (ref 1.7–2.4)

## 2021-07-19 SURGERY — ERCP, WITH INTERVENTION IF INDICATED
Anesthesia: General

## 2021-07-19 MED ORDER — ROCURONIUM BROMIDE 10 MG/ML (PF) SYRINGE
PREFILLED_SYRINGE | INTRAVENOUS | Status: DC | PRN
  Administered 2021-07-19: 20 mg via INTRAVENOUS
  Administered 2021-07-19: 40 mg via INTRAVENOUS

## 2021-07-19 MED ORDER — PHENYLEPHRINE 40 MCG/ML (10ML) SYRINGE FOR IV PUSH (FOR BLOOD PRESSURE SUPPORT)
PREFILLED_SYRINGE | INTRAVENOUS | Status: DC | PRN
  Administered 2021-07-19: 120 ug via INTRAVENOUS
  Administered 2021-07-19: 80 ug via INTRAVENOUS
  Administered 2021-07-19: 120 ug via INTRAVENOUS

## 2021-07-19 MED ORDER — LIDOCAINE 2% (20 MG/ML) 5 ML SYRINGE
INTRAMUSCULAR | Status: DC | PRN
  Administered 2021-07-19: 60 mg via INTRAVENOUS

## 2021-07-19 MED ORDER — ALPRAZOLAM 0.25 MG PO TABS
0.1250 mg | ORAL_TABLET | Freq: Once | ORAL | Status: AC
  Administered 2021-07-20: 0.125 mg via ORAL
  Filled 2021-07-19: qty 1

## 2021-07-19 MED ORDER — PROPOFOL 10 MG/ML IV BOLUS
INTRAVENOUS | Status: AC
  Filled 2021-07-19: qty 20

## 2021-07-19 MED ORDER — EPHEDRINE SULFATE-NACL 50-0.9 MG/10ML-% IV SOSY
PREFILLED_SYRINGE | INTRAVENOUS | Status: DC | PRN
  Administered 2021-07-19: 10 mg via INTRAVENOUS

## 2021-07-19 MED ORDER — ALUM & MAG HYDROXIDE-SIMETH 200-200-20 MG/5ML PO SUSP
30.0000 mL | Freq: Four times a day (QID) | ORAL | Status: DC | PRN
  Administered 2021-07-25: 30 mL via ORAL
  Filled 2021-07-19: qty 30

## 2021-07-19 MED ORDER — ONDANSETRON HCL 4 MG/2ML IJ SOLN
INTRAMUSCULAR | Status: DC | PRN
  Administered 2021-07-19: 4 mg via INTRAVENOUS

## 2021-07-19 MED ORDER — ESMOLOL HCL 100 MG/10ML IV SOLN
INTRAVENOUS | Status: DC | PRN
  Administered 2021-07-19: 20 mg via INTRAVENOUS

## 2021-07-19 MED ORDER — SUGAMMADEX SODIUM 200 MG/2ML IV SOLN
INTRAVENOUS | Status: DC | PRN
  Administered 2021-07-19: 200 mg via INTRAVENOUS

## 2021-07-19 MED ORDER — FENTANYL CITRATE (PF) 250 MCG/5ML IJ SOLN
INTRAMUSCULAR | Status: AC
  Filled 2021-07-19: qty 5

## 2021-07-19 MED ORDER — DEXAMETHASONE SODIUM PHOSPHATE 10 MG/ML IJ SOLN
INTRAMUSCULAR | Status: DC | PRN
  Administered 2021-07-19: 5 mg via INTRAVENOUS

## 2021-07-19 MED ORDER — PHENYLEPHRINE HCL (PRESSORS) 10 MG/ML IV SOLN
INTRAVENOUS | Status: AC
  Filled 2021-07-19: qty 2

## 2021-07-19 MED ORDER — FENTANYL CITRATE (PF) 250 MCG/5ML IJ SOLN
INTRAMUSCULAR | Status: DC | PRN
  Administered 2021-07-19 (×2): 100 ug via INTRAVENOUS

## 2021-07-19 MED ORDER — PHENYLEPHRINE HCL-NACL 20-0.9 MG/250ML-% IV SOLN
INTRAVENOUS | Status: DC | PRN
  Administered 2021-07-19: 50 ug/min via INTRAVENOUS

## 2021-07-19 MED ORDER — PROPOFOL 10 MG/ML IV BOLUS
INTRAVENOUS | Status: DC | PRN
  Administered 2021-07-19: 200 mg via INTRAVENOUS

## 2021-07-19 NOTE — Op Note (Addendum)
[facility] Patient Name: [name] [name] Procedure Date: 07/19/2021 MRN: [id number] Attending MD: Carol Ada , MD Date of Birth: 08-25-1951 CSN: [id number] Age: 70 Admit Type: Inpatient Procedure:                ERCP Indications:              Common bile duct stone(s) Providers:                Carol Ada, MD, Mariana Arn, [place],                            Technician, [place] [address]. Zenia Resides CRNA, CRNA Referring MD:              Medicines:                General Anesthesia Complications:            No immediate complications. Estimated Blood Loss:     Estimated blood loss: none. Procedure:                Pre-Anesthesia Assessment:                           - Prior to the procedure, a History and Physical                            was performed, and patient medications and                            allergies were reviewed. The patient's tolerance of                            previous anesthesia was also reviewed. The risks                            and benefits of the procedure and the sedation                            options and risks were discussed with the patient.                            All questions were answered, and informed consent                            was obtained. Prior Anticoagulants: The patient has                            taken no previous anticoagulant or antiplatelet                            agents. ASA Grade Assessment: III - A patient with                            severe systemic disease. After reviewing the risks  and benefits, the patient was deemed in                            satisfactory condition to undergo the procedure.                           - Sedation was administered by an anesthesia                            professional. General anesthesia was attained.                           After obtaining informed consent, the scope was                            passed under direct vision.  Throughout the                            procedure, the patient's blood pressure, pulse, and                            oxygen saturations were monitored continuously. The                            [id number]) Olympus duodenoscope was                            introduced through the mouth, and used to inject                            contrast into and used to inject contrast into the                            bile duct and ventral pancreatic duct. The ERCP was                            technically difficult and complex due to                            challenging cannulation. The patient tolerated the                            procedure well. Scope In: Scope Out: Findings:      The major papilla was normal. Revolution 0.025 guidewire was passed into       the biliary tree. A 15 mm biliary sphincterotomy was made with a       monofilament traction (standard) sphincterotome using ERBE       electrocautery. There was no post-sphincterotomy bleeding. The biliary       tree was swept with a 12 mm balloon starting at the bifurcation. Sludge       was swept from the duct. One stone was removed. No stones remained.      The inital cannulation was into the PD. The wire was advanced and left       in  place for a two wire technique. After multiple attempts the PD wire       was removed. Further repositionings ultimately allowed for the CBD to be       cannulated. The guidewire was secured in the left intrahepatic ducts.       Contrast injection did not show a filling defect and the CBD measured       around 8 mm. The ampulla was large and this allowed for a generous       sphincterotomy, approximately 1.5 cm. A copious amount of sludge and       bile escaped from the CBD with the sphincterotomy. Using a 9-12 mm       balloon a stone was extracted with the second sweep. Two subsequent       sweeps only yielded minor debris. The final occlusion cholangiogram was       negative for any  retained stones. Impression:               - The major papilla appeared normal.                           - Choledocholithiasis was found. Complete removal                            was accomplished by biliary sphincterotomy and                            balloon extraction.                           - A biliary sphincterotomy was performed.                           - The biliary tree was swept. Moderate Sedation:      Not Applicable - Patient had care per Anesthesia. Recommendation:           - Return patient to hospital ward for ongoing care.                           - Clear liquid diet.                           - Lap chole per Surgery. Procedure Code(s):        --- Professional ---                           [phone], Endoscopic retrograde                            cholangiopancreatography (ERCP); with removal of                            calculi/debris from biliary/pancreatic duct(s)                           43262, Endoscopic retrograde                            cholangiopancreatography (ERCP); with  sphincterotomy/papillotomy Diagnosis Code(s):        --- Professional ---                           K80.50, Calculus of bile duct without cholangitis                            or cholecystitis without obstruction CPT copyright 2019 [employer]. All rights reserved. The codes documented in this report are preliminary and upon coder review may  be revised to meet current compliance requirements. Carol Ada, MD Carol Ada, MD 07/19/2021 3:06:57 PM This report has been signed electronically. Number of Addenda: 0

## 2021-07-19 NOTE — Progress Notes (Signed)
Progress Note     Subjective: Patient reports feeling sleepy this AM. Denies significant abdominal pain or nausea. He is frustrated at not being able to have ERCP and lap chole today.   Objective: Vital signs in last 24 hours: Temp:  [97.1 F (36.2 C)-97.8 F (36.6 C)] 97.8 F (36.6 C) (08/30 0514) Pulse Rate:  [85-87] 85 (08/30 0514) Resp:  [20] 20 (08/30 0514) BP: (111-116)/(80-85) 114/85 (08/30 0514) SpO2:  [96 %-97 %] 96 % (08/30 0514) Weight:  [99.4 kg] 99.4 kg (08/30 0209) Last BM Date: 07/17/21  Intake/Output from previous day: 08/29 0701 - 08/30 0700 In: 1723.8 [I.V.:1648.7; IV Piggyback:75.1] Out: [id number] [Urine:1570] Intake/Output this shift: No intake/output data recorded.  PE: General: WD, elderly malewho is laying in bed in NAD HEENT: Sclera are anicteric.   Heart: regular, rate, and rhythm.  Lungs: CTAB, no wheezes, rhonchi, or rales noted.  Respiratory effort nonlabored Abd: soft, NT, ND, +BS, no masses, hernias, or organomegaly MS: all 4 extremities are symmetrical with no cyanosis, clubbing, or edema. Psych: A&Ox3 with an appropriate affect.    Lab Results:  Recent Labs    07/18/21 0210 07/19/21 0513  WBC 14.2* 12.3*  HGB 16.2 16.1  HCT 49.0 50.7  PLT 82* 86*   BMET Recent Labs    07/18/21 0210 07/19/21 0513  NA 133* 135  K 4.0 4.2  CL 96* 101  CO2 24 25  GLUCOSE 98 121*  BUN 43* 40*  CREATININE 2.01* 2.12*  CALCIUM 9.3 9.3   PT/INR No results for input(s): LABPROT, INR in the last 72 hours. CMP     Component Value Date/Time   NA 135 07/19/2021 0513   NA 146 (H) 05/20/2021 1654   K 4.2 07/19/2021 0513   CL 101 07/19/2021 0513   CO2 25 07/19/2021 0513   GLUCOSE 121 (H) 07/19/2021 0513   BUN 40 (H) 07/19/2021 0513   BUN 26 05/20/2021 1654   CREATININE 2.12 (H) 07/19/2021 0513   CALCIUM 9.3 07/19/2021 0513   PROT 6.4 (L) 07/19/2021 0513   PROT 7.0 09/22/2020 1512   ALBUMIN 2.8 (L) 07/19/2021 0513   ALBUMIN 4.5 09/22/2020  1512   AST 53 (H) 07/19/2021 0513   ALT 24 07/19/2021 0513   ALKPHOS 210 (H) 07/19/2021 0513   BILITOT 3.4 (H) 07/19/2021 0513   BILITOT 1.1 09/22/2020 1512   GFRNONAA 33 (L) 07/19/2021 0513   GFRAA 53 (L) 09/22/2020 1512   Lipase     Component Value Date/Time   LIPASE 40 07/19/2021 0513       Studies/Results: CT ABDOMEN PELVIS WO CONTRAST  Result Date: 07/18/2021 CLINICAL DATA:  Abdominal pain x3 days. EXAM: CT ABDOMEN AND PELVIS WITHOUT CONTRAST TECHNIQUE: Multidetector CT imaging of the abdomen and pelvis was performed following the standard protocol without IV contrast. COMPARISON:  None. FINDINGS: Lower chest: Moderate severity atelectasis and/or infiltrate is seen within the posterior aspects of the bilateral lung bases. There are small bilateral pleural effusions. Hepatobiliary: No focal liver abnormality is seen. The gallbladder is markedly distended with diffuse gallbladder wall thickening and pericholecystic inflammation. A 2 mm gallstone is suspected within the distal common bile duct (axial CT image 39, CT series 2). Pancreas: Mild inflammatory fat stranding is seen along the posterior aspect of the tail of the pancreas. The pancreatic parenchyma is otherwise normal in appearance. Spleen: Normal in size without focal abnormality. Adrenals/Urinary Tract: Adrenal glands are unremarkable. Kidneys are normal in size, without obstructing renal calculi or  hydronephrosis. A 9 mm nonobstructing renal stone is seen within the mid right kidney. A 1.4 cm diameter cyst is seen within the anterior aspect of the mid right kidney. Bladder is unremarkable. Stomach/Bowel: Stomach is within normal limits. Appendix appears normal. No evidence of bowel wall thickening, distention, or inflammatory changes. Vascular/Lymphatic: Aortic atherosclerosis. No enlarged abdominal or pelvic lymph nodes. Reproductive: Prostate is unremarkable. Other: No abdominal wall hernia or abnormality. A small amount of  abdominal and pelvic free fluid is seen with diffuse nonspecific mesenteric inflammatory fat stranding noted. Musculoskeletal: Degenerative changes are seen within the lumbar spine, most prominent at the level of L5-S1. IMPRESSION: 1. Findings consistent with acute cholecystitis with a tiny gallstone noted within the distal common bile duct. 2. Mild acute pancreatitis along the tail of the pancreas can not be excluded. Correlation with pancreatic enzymes is recommended. 3. 9 mm nonobstructing renal stone within the right kidney. Electronically Signed   By: Virgina Norfolk M.D.   On: 07/18/2021 00:02   DG CHEST PORT 1 VIEW  Result Date: 07/18/2021 CLINICAL DATA:  Weakness EXAM: PORTABLE CHEST 1 VIEW COMPARISON:  07/12/2021 FINDINGS: Cardiac shadow is enlarged but stable. Increasing right basilar atelectasis is noted. Left lung remains clear. No bony abnormality is noted. IMPRESSION: Increasing right basilar atelectasis. Electronically Signed   By: Inez Catalina M.D.   On: 07/18/2021 14:36   US Abdomen Limited RUQ (LIVER/GB)  Result Date: 07/17/2021 CLINICAL DATA:  Right upper quadrant pain for 4 days. EXAM: ULTRASOUND ABDOMEN LIMITED RIGHT UPPER QUADRANT COMPARISON:  None. FINDINGS: Gallbladder: The gallbladder wall is thickened and mildly irregular. Sludge is identified. At least one 6 mm stone is noted. Pericholecystic fluid is identified. No Murphy's sign reported. Common bile duct: Diameter: 3 mm Liver: No focal lesion identified. Within normal limits in parenchymal echogenicity. Portal vein is patent on color Doppler imaging with normal direction of blood flow towards the liver. Other: None. IMPRESSION: 1. The gallbladder is abnormal in appearance with a mildly irregular thickened wall. There is sludge in the gallbladder as well as at least 1 stone. Pericholecystic fluid is noted. No Murphy's sign. If the clinical picture remains ambiguous, recommend a HIDA scan. 2. A small amount of free fluid is seen  in the abdomen. 3. No other acute abnormalities. Electronically Signed   By: Dorise Bullion III M.D.   On: 07/17/2021 21:01    Anti-infectives: Anti-infectives (From admission, onward)    Start     Dose/Rate Route Frequency Ordered Stop   07/18/21 0800  piperacillin-tazobactam (ZOSYN) IVPB 3.375 g        3.375 g 12.5 mL/hr over 240 Minutes Intravenous Every 8 hours 07/18/21 0142     07/18/21 0015  piperacillin-tazobactam (ZOSYN) IVPB 3.375 g        3.375 g 100 mL/hr over 30 Minutes Intravenous  Once 07/18/21 0007 07/18/21 0129        Assessment/Plan Choledocholithiasis - [place] and CT representative of above - ERCP planned for today  - Tbili 3.4 today and lipase 40 - will plan for laparoscopic cholecystectomy tomorrow barring any post-ERCP complications  FEN: NPO, IVF per primary  VTE: SCDs, eliquis on hold ID: Zosyn 8/29>>  - below per primary service -  [id number] Chronic systolic CHF CKD stage III Paroxysmal A fib on eliquis CAD HTN OA  LOS: 1 day    Norm Parcel, [facility] 07/19/2021, 11:04 AM Please see Amion for pager number during day hours 7:00am-4:30pm

## 2021-07-19 NOTE — Progress Notes (Signed)
Patient returned to room 1508 from ERCP. Patient flagged a yellow mews due to his RR was 6-8 and his HR was 110-120's. Patient is A&O x4 and complains of no pain. Notified Dr. Starla Link, Dr. Gardiner Rhyme, and RRN [place]. See new orders. Will continue to monitor.     07/19/21 1550  Assess: MEWS Score  Temp 98.2 F (36.8 C)  BP 115/77  Pulse Rate 86  Resp (!) 7  Level of Consciousness Alert  SpO2 (!) 84 %  O2 Device Nasal Cannula  O2 Flow Rate (L/min) 2 L/min  Assess: MEWS Score  MEWS Temp 0  MEWS Systolic 0  MEWS Pulse 0  MEWS RR 2  MEWS LOC 0  MEWS Score 2  MEWS Score Color Yellow  Assess: if the MEWS score is Yellow or Red  Were vital signs taken at a resting state? Yes  Focused Assessment No change from prior assessment  Does the patient meet 2 or more of the SIRS criteria? Yes  Does the patient have a confirmed or suspected source of infection? No  MEWS guidelines implemented *See Row Information* Yes  Treat  MEWS Interventions Other (Comment) (CXR pending results)  Pain Scale 0-10  Pain Score 0  Take Vital Signs  Increase Vital Sign Frequency  Yellow: Q 2hr X 2 then Q 4hr X 2, if remains yellow, continue Q 4hrs  Escalate  MEWS: Escalate Yellow: discuss with charge nurse/RN and consider discussing with provider and RRT  Notify: Charge Nurse/RN  Name of Charge Nurse/RN Notified Sonne RN  Date Charge Nurse/RN Notified 07/19/21  Time Charge Nurse/RN Notified 1550  Notify: Provider  Provider Name/Title Dr. Starla Link  Date Provider Notified 07/19/21  Time Provider Notified 1550  Notification Type Page  Notification Reason Change in status  Provider response See new orders  Date of Provider Response 07/19/21  Time of Provider Response 1645  Notify: Rapid Response  Name of Rapid Response RN Notified Alexis  Date Rapid Response Notified 07/19/21  Time Rapid Response Notified [id number]  Document  Patient Outcome Other (Comment) (remains on unit)  Progress note created (see row info)  Yes  Assess: SIRS CRITERIA  SIRS Temperature  0  SIRS Pulse 0  SIRS Respirations  0  SIRS WBC 1  SIRS Score Sum  1

## 2021-07-19 NOTE — Anesthesia Preprocedure Evaluation (Signed)
Anesthesia Evaluation  Patient identified by MRN, date of birth, ID band Patient awake    Reviewed: Allergy & Precautions, NPO status , Patient's Chart, lab work & pertinent test results  Airway Mallampati: II  TM Distance: >3 FB Neck ROM: Full    Dental no notable dental hx.    Pulmonary former smoker,    Pulmonary exam normal breath sounds clear to auscultation       Cardiovascular hypertension, + CAD, + Past MI, + Cardiac Stents and +CHF  Normal cardiovascular exam+ dysrhythmias Atrial Fibrillation  Rhythm:Regular Rate:Normal     Neuro/Psych PSYCHIATRIC DISORDERS Depression negative neurological ROS     GI/Hepatic negative GI ROS, Neg liver ROS,   Endo/Other  diabetes, Insulin Dependent  Renal/GU Renal disease     Musculoskeletal  (+) Arthritis ,   Abdominal   Peds  Hematology  (+) Blood dyscrasia, , HLD Thrombocytopenia    Anesthesia Other Findings   Reproductive/Obstetrics                             Anesthesia Physical Anesthesia Plan  ASA: 3  Anesthesia Plan: General   Post-op Pain Management:    Induction: Intravenous  PONV Risk Score and Plan: 2 and Ondansetron, Dexamethasone and Treatment may vary due to age or medical condition  Airway Management Planned: Oral ETT  Additional Equipment:   Intra-op Plan:   Post-operative Plan: Extubation in OR  Informed Consent: I have reviewed the patients History and Physical, chart, labs and discussed the procedure including the risks, benefits and alternatives for the proposed anesthesia with the patient or authorized representative who has indicated his/her understanding and acceptance.     Dental advisory given  Plan Discussed with: CRNA  Anesthesia Plan Comments:         Anesthesia Quick Evaluation

## 2021-07-19 NOTE — Anesthesia Procedure Notes (Signed)
Procedure Name: Intubation Date/Time: 07/19/2021 1:43 PM Performed by: Lollie Sails, CRNA Pre-anesthesia Checklist: Patient identified, Emergency Drugs available, Suction available, Patient being monitored and Timeout performed Patient Re-evaluated:Patient Re-evaluated prior to induction Oxygen Delivery Method: Circle system utilized Preoxygenation: Pre-oxygenation with 100% oxygen Induction Type: IV induction Ventilation: Mask ventilation without difficulty Laryngoscope Size: Miller and 3 Grade View: Grade II Tube type: Oral Tube size: 7.5 mm Number of attempts: 1 Airway Equipment and Method: Stylet Placement Confirmation: ETT inserted through vocal cords under direct vision, positive ETCO2 and breath sounds checked- equal and bilateral Secured at: 23 cm Tube secured with: Tape Dental Injury: Teeth and Oropharynx as per pre-operative assessment

## 2021-07-19 NOTE — Progress Notes (Signed)
  Echocardiogram 2D Echocardiogram has been performed.  [name] [name] 07/19/2021, 12:11 PM

## 2021-07-19 NOTE — Significant Event (Signed)
Rapid Response Event Note   Reason for Call : tachypnea, tachycardia   Initial Focused Assessment: upon assessment, patient is alert and oriented in bed. Delayed responses, endorses feeling short of breath. Fine crackles heard in bases upon auscultation. Vital signs stable. BP in [id number] systolic. HR 100-120's in a-fib. O2 100% on 2L nasal cannula.       Interventions: IV fluids stopped per MD, chest xray ordered. Showed atelectasis.    Plan of Care:  Continue to monitor on the floor   Event Summary:   MD Notified: Dr. Starla Link @ 1625 Call Time: [id number] Arrival Time: [id number] End Time: 1640  Clarene Critchley, RN

## 2021-07-19 NOTE — Progress Notes (Signed)
Patient ID: [name] [name], male   DOB: 08-20-1951, 70 y.o.   MRN: [id number]  PROGRESS NOTE    [name] [name]  [id number] DOB: [birth year] DOA: 07/17/2021 PCP: Raeanne Gathers, MD   Brief Narrative:   70 y.o. male with medical history significant for DMT2, Chronic systolic CHF, CKD 3, PAF, CAD, HTN, OA presented with abdominal pain and diarrhea.  On presentation, he was found to have acute cholecystitis with choledocholithiasis and mild pancreatitis along with leukocytosis and elevated LFTs.  He was started on broad-spectrum antibiotics.  GI and general surgery were consulted.  Assessment & Plan:   Acute calculus cholecystitis Choledocholithiasis Acute biliary pancreatitis Elevated LFTs, from above -Imaging was consistent with acute cholecystitis with choledocholithiasis and mild pancreatitis -GI and general surgery following.  For ERCP by GI today. -Continue IV antibiotics.  Continue gentle hydration as patient has chronic systolic heart failure -Will eventually need cholecystectomy as well -Monitor LFTs.  Chronic systolic heart failure -Currently compensated.  Cardiology following and continuing Lasix, spironolactone -Strict input and output.  Daily weights.  Leukocytosis -Improving.  Monitor  Hyponatremia -Mild.  Monitor  Chronic kidney disease stage IIIb -Renal function currently stable.  Monitor   Paroxysmal A. fib -Currently rate controlled.  Eliquis on hold.  Thrombocytopenia -Chronic.  Monitor.  Currently stable.  Diabetes mellitus type 2 with hypoglycemia -A1c 6.6.  Continue CBGs with SSI and Farxiga.  CAD -Currently stable.  No active chest pain.  Cardiology following.   DVT prophylaxis: SCDs Code Status: Full Family Communication: None at bedside Disposition Plan: Status is: Inpatient  Remains inpatient appropriate because:Inpatient level of care appropriate due to severity of illness  Dispo: The patient is from: Home               Anticipated d/c is to: Home              Patient currently is not medically stable to d/c.   Difficult to place patient No  Consultants: GI/general surgery/cardiology  Procedures: None  Antimicrobials:  Anti-infectives (From admission, onward)    Start     Dose/Rate Route Frequency Ordered Stop   07/18/21 0800  piperacillin-tazobactam (ZOSYN) IVPB 3.375 g        3.375 g 12.5 mL/hr over 240 Minutes Intravenous Every 8 hours 07/18/21 0142     07/18/21 0015  piperacillin-tazobactam (ZOSYN) IVPB 3.375 g        3.375 g 100 mL/hr over 30 Minutes Intravenous  Once 07/18/21 0007 07/18/21 0129        Subjective: Patient seen and examined at bedside.  Denies worsening abdominal pain.  No overnight fever, vomiting, worsening shortness of breath reported.  Objective: Vitals:   07/18/21 1658 07/18/21 2012 07/19/21 0209 07/19/21 0514  BP: 111/80 114/84  114/85  Pulse: 87 87  85  Resp: '20 20  20  '$ Temp: (!) 97.1 F (36.2 C) 97.6 F (36.4 C)  97.8 F (36.6 C)  TempSrc: Oral Axillary  Axillary  SpO2: 96% 96%  96%  Weight:  99.4 kg 99.4 kg   Height:        Intake/Output Summary (Last 24 hours) at 07/19/2021 1107 Last data filed at 07/19/2021 0334 Gross per 24 hour  Intake 1723.77 ml  Output 1570 ml  Net 153.77 ml   Filed Weights   07/18/21 0228 07/18/21 2012 07/19/21 0209  Weight: 101 kg 99.4 kg 99.4 kg    Examination:  General exam: Appears calm and comfortable.  Currently on room air. Respiratory  system: Bilateral decreased breath sounds at bases Cardiovascular system: S1 & S2 heard, Rate controlled Gastrointestinal system: Abdomen is slightly distended, soft and mildly tender in the right upper quadrant.  Normal bowel sounds heard. Extremities: No cyanosis, clubbing; trace lower extremity edema present  Central nervous system: Alert and oriented. No focal neurological deficits. Moving extremities Skin: No rashes, lesions or ulcers Psychiatry: Affect is mostly flat.  Slow  to respond   Data Reviewed: I have personally reviewed following labs and imaging studies  CBC: Recent Labs  Lab 07/12/21 2118 07/17/21 1848 07/18/21 0210 07/19/21 0513  WBC 7.0 16.5* 14.2* 12.3*  NEUTROABS 5.5 14.5*  --  10.5*  HGB 15.7 16.8 16.2 16.1  HCT 50.9 53.2* 49.0 50.7  MCV 83.2 82.2 79.3* 80.1  PLT 101* 88* 82* 86*   Basic Metabolic Panel: Recent Labs  Lab 07/12/21 1340 07/12/21 2118 07/17/21 1848 07/18/21 0210 07/19/21 0513  NA 140 140 133* 133* 135  K 3.9 4.1 4.2 4.0 4.2  CL 106 103 96* 96* 101  CO2 '26 24 25 24 25  '$ GLUCOSE 134* 139* 128* 98 121*  BUN 27* 29* 44* 43* 40*  CREATININE 1.77* 2.02* 2.00* 2.01* 2.12*  CALCIUM 9.3 9.7 9.4 9.3 9.3  MG  --   --   --   --  2.2   GFR: Estimated Creatinine Clearance: 41.4 mL/min (A) (by C-G formula based on SCr of 2.12 mg/dL (H)). Liver Function Tests: Recent Labs  Lab 07/12/21 2118 07/17/21 1848 07/18/21 0210 07/19/21 0513  AST 24 51* 49* 53*  ALT '17 23 21 24  '$ ALKPHOS 128* 171* 169* 210*  BILITOT 2.7* 4.1* 3.7* 3.4*  PROT 7.1 7.0 6.4* 6.4*  ALBUMIN 4.1 3.3* 3.1* 2.8*   Recent Labs  Lab 07/17/21 1848 07/19/21 0513  LIPASE 105* 40   No results for input(s): AMMONIA in the last 168 hours. Coagulation Profile: No results for input(s): INR, PROTIME in the last 168 hours. Cardiac Enzymes: No results for input(s): CKTOTAL, CKMB, CKMBINDEX, TROPONINI in the last 168 hours. BNP (last 3 results) No results for input(s): PROBNP in the last 8760 hours. HbA1C: Recent Labs    07/18/21 0210  HGBA1C 6.6*   CBG: Recent Labs  Lab 07/18/21 1238 07/18/21 1324 07/18/21 1740 07/18/21 2305 07/19/21 0522  GLUCAP 61* 113* 87 149* 140*   Lipid Profile: No results for input(s): CHOL, HDL, LDLCALC, TRIG, CHOLHDL, LDLDIRECT in the last 72 hours. Thyroid Function Tests: No results for input(s): TSH, T4TOTAL, FREET4, T3FREE, THYROIDAB in the last 72 hours. Anemia Panel: No results for input(s): VITAMINB12,  FOLATE, FERRITIN, TIBC, IRON, RETICCTPCT in the last 72 hours. Sepsis Labs: No results for input(s): PROCALCITON, LATICACIDVEN in the last 168 hours.  Recent Results (from the past 240 hour(s))  Resp Panel by RT-PCR (Flu A&B, Covid) Nasopharyngeal Swab     Status: None   Collection Time: 07/17/21  8:01 PM   Specimen: Nasopharyngeal Swab; Nasopharyngeal(NP) swabs in vial transport medium  Result Value Ref Range Status   SARS Coronavirus 2 by RT PCR NEGATIVE NEGATIVE Final    Comment: (NOTE) SARS-CoV-2 target nucleic acids are NOT DETECTED.  The SARS-CoV-2 RNA is generally detectable in upper respiratory specimens during the acute phase of infection. The lowest concentration of SARS-CoV-2 viral copies this assay can detect is 138 copies/mL. A negative result does not preclude SARS-Cov-2 infection and should not be used as the sole basis for treatment or other patient management decisions. A negative result may occur  with  improper specimen collection/handling, submission of specimen other than nasopharyngeal swab, presence of viral mutation(s) within the areas targeted by this assay, and inadequate number of viral copies(<138 copies/mL). A negative result must be combined with clinical observations, patient history, and epidemiological information. The expected result is Negative.  Fact Sheet for Patients:  EntrepreneurPulse.com.au  Fact Sheet for Healthcare Providers:  IncredibleEmployment.be  This test is no t yet approved or cleared by the [place] FDA and  has been authorized for detection and/or diagnosis of SARS-CoV-2 by FDA under an Emergency Use Authorization (EUA). This EUA will remain  in effect (meaning this test can be used) for the duration of the COVID-19 declaration under Section 564(b)(1) of the Act, 21 U.S.C.section 360bbb-3(b)(1), unless the authorization is terminated  or revoked sooner.       Influenza A by PCR  NEGATIVE NEGATIVE Final   Influenza B by PCR NEGATIVE NEGATIVE Final    Comment: (NOTE) The Xpert Xpress SARS-CoV-2/FLU/RSV plus assay is intended as an aid in the diagnosis of influenza from Nasopharyngeal swab specimens and should not be used as a sole basis for treatment. Nasal washings and aspirates are unacceptable for Xpert Xpress SARS-CoV-2/FLU/RSV testing.  Fact Sheet for Patients: EntrepreneurPulse.com.au  Fact Sheet for Healthcare Providers: IncredibleEmployment.be  This test is not yet approved or cleared by the [place] FDA and has been authorized for detection and/or diagnosis of SARS-CoV-2 by FDA under an Emergency Use Authorization (EUA). This EUA will remain in effect (meaning this test can be used) for the duration of the COVID-19 declaration under Section 564(b)(1) of the Act, 21 U.S.C. section 360bbb-3(b)(1), unless the authorization is terminated or revoked.  Performed at [facility], [place] [address], [place], [place] [address]   Urine Culture     Status: None   Collection Time: 07/17/21 11:58 PM   Specimen: Urine, Clean Catch  Result Value Ref Range Status   Specimen Description   Final    URINE, CLEAN CATCH Performed at [facility], [place] [address] [place], [place] [address]    Special Requests   Final    NONE Performed at [facility], [place] [address], [place], [place] [address]    Culture   Final    NO GROWTH Performed at [facility], [place] [address], [place], [place] [address]    Report Status 07/19/2021 FINAL  Final  Blood culture (routine x 2)     Status: None (Preliminary result)   Collection Time: 07/18/21  2:10 AM   Specimen: BLOOD  Result Value Ref Range Status   Specimen Description   Final    BLOOD LEFT ANTECUBITAL Performed at [place] [address] [place], [place] [address]    Special Requests   Final     BOTTLES DRAWN AEROBIC ONLY Blood Culture adequate volume Performed at [place] [address] [place], [place] [address]    Culture   Final    NO GROWTH < 24 HOURS Performed at [place] [address], [place],  [address]    Report Status PENDING  Incomplete  Blood culture (routine x 2)     Status: None (Preliminary result)   Collection Time: 07/18/21  2:10 AM   Specimen: BLOOD  Result Value Ref Range Status   Specimen Description   Final    BLOOD BLOOD RIGHT FOREARM Performed at [place] [address] [place], [place]  [address]    Special Requests   Final    BOTTLES DRAWN AEROBIC ONLY Blood Culture adequate volume Performed at [place] [address], [place], [place] [address]    Culture   Final    NO GROWTH < 24 HOURS Performed at [place] [address] [place], [place] [address]    Report Status PENDING  Incomplete         Radiology Studies: CT ABDOMEN PELVIS WO CONTRAST  Result Date: 07/18/2021 CLINICAL DATA:  Abdominal pain x3 days. EXAM: CT ABDOMEN AND PELVIS WITHOUT CONTRAST TECHNIQUE: Multidetector CT imaging of the abdomen and pelvis was performed following the standard protocol without IV contrast. COMPARISON:  None. FINDINGS: Lower chest: Moderate severity atelectasis and/or infiltrate is seen within the posterior aspects of the bilateral lung bases. There are small bilateral pleural effusions. Hepatobiliary: No focal liver abnormality is seen. The gallbladder is markedly distended with diffuse gallbladder wall thickening and pericholecystic inflammation. A 2 mm gallstone is suspected within the distal common bile duct (axial CT image 39, CT series 2). Pancreas: Mild inflammatory fat stranding is seen along the posterior aspect of the tail of the pancreas. The pancreatic parenchyma is otherwise normal in appearance. Spleen: Normal in size without focal abnormality.  Adrenals/Urinary Tract: Adrenal glands are unremarkable. Kidneys are normal in size, without obstructing renal calculi or hydronephrosis. A 9 mm nonobstructing renal stone is seen within the mid right kidney. A 1.4 cm diameter cyst is seen within the anterior aspect of the mid right kidney. Bladder is unremarkable. Stomach/Bowel: Stomach is within normal limits. Appendix appears normal. No evidence of bowel wall thickening, distention, or inflammatory changes. Vascular/Lymphatic: Aortic atherosclerosis. No enlarged abdominal or pelvic lymph nodes. Reproductive: Prostate is unremarkable. Other: No abdominal wall hernia or abnormality. A small amount of abdominal and pelvic free fluid is seen with diffuse nonspecific mesenteric inflammatory fat stranding noted. Musculoskeletal: Degenerative changes are seen within the lumbar spine, most prominent at the level of L5-S1. IMPRESSION: 1. Findings consistent with acute cholecystitis with a tiny gallstone noted within the distal common bile duct. 2. Mild acute pancreatitis along the tail of the pancreas can not be excluded. Correlation with pancreatic enzymes is recommended. 3. 9 mm nonobstructing renal stone within the right kidney. Electronically Signed   By: Virgina Norfolk M.D.   On: 07/18/2021 00:02   DG CHEST PORT 1 VIEW  Result Date: 07/18/2021 CLINICAL DATA:  Weakness EXAM: PORTABLE CHEST 1 VIEW COMPARISON:  07/12/2021 FINDINGS: Cardiac shadow is enlarged but stable. Increasing right basilar atelectasis is noted. Left lung remains clear. No bony abnormality is noted. IMPRESSION: Increasing right basilar atelectasis. Electronically Signed   By: Inez Catalina M.D.   On: 07/18/2021 14:36   US Abdomen Limited RUQ (LIVER/GB)  Result Date: 07/17/2021 CLINICAL DATA:  Right upper quadrant pain for 4 days. EXAM: ULTRASOUND ABDOMEN LIMITED RIGHT UPPER QUADRANT COMPARISON:  None. FINDINGS: Gallbladder: The gallbladder wall is thickened and mildly irregular. Sludge is  identified. At least one 6 mm stone is noted. Pericholecystic fluid is identified. No Murphy's sign reported. Common bile duct: Diameter: 3 mm Liver: No focal lesion identified. Within normal limits in parenchymal echogenicity. Portal vein is patent on color Doppler imaging with normal direction of blood flow towards the liver. Other: None. IMPRESSION: 1. The gallbladder is abnormal in appearance with a mildly irregular thickened wall. There is sludge in the gallbladder as well as at least 1 stone. Pericholecystic fluid is noted. No Murphy's  sign. If the clinical picture remains ambiguous, recommend a HIDA scan. 2. A small amount of free fluid is seen in the abdomen. 3. No other acute abnormalities. Electronically Signed   By: Dorise Bullion III M.D.   On: 07/17/2021 21:01        Scheduled Meds:  dapagliflozin propanediol  10 mg Oral QHS   furosemide  40 mg Oral Daily   potassium chloride SA  20 mEq Oral Daily   rosuvastatin  40 mg Oral QHS   spironolactone  25 mg Oral QHS   Continuous Infusions:  lactated ringers 75 mL/hr at 07/18/21 1539   piperacillin-tazobactam (ZOSYN)  IV 3.375 g (07/19/21 [id number])          Aline August, MD [facility] 07/19/2021, 11:07 AM

## 2021-07-19 NOTE — Progress Notes (Addendum)
Unable to see patient today, he was in endoscopy for ERCP.  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  PYP scan has been ordered as outpatient to evaluate for amyloidosis.  Telemetry reviewed, shows intermittent atrial fibrillation.  Eliquis currently being held for procedure, would restart when able.  As discussed in note yesterday, in regards to his preop evaluation, his RCRI score is 5 (CHF, CAD, CKD, IDDM, intraperitoneal surgery), which corresponds to 15% 30-day risk of death, MI, or cardiac arrest.  He recently underwent cardiac catheterization with moderate disease but no targets for intervention.  No further ischemic workup recommended at this time. Would classify as high risk for an intermediate risk surgery.  D/w Surgery.

## 2021-07-19 NOTE — Transfer of Care (Signed)
Immediate Anesthesia Transfer of Care Note  Patient: [name] [name]  Procedure(s) Performed: ENDOSCOPIC RETROGRADE CHOLANGIOPANCREATOGRAPHY (ERCP) SPHINCTEROTOMY REMOVAL OF STONES  Patient Location: PACU and Endoscopy Unit  Anesthesia Type:General  Level of Consciousness: awake, drowsy and responds to stimulation  Airway & Oxygen Therapy: Patient Spontanous Breathing and Patient connected to face mask oxygen  Post-op Assessment: Report given to RN and Post -op Vital signs reviewed and stable  Post vital signs: Reviewed and stable  Last Vitals:  Vitals Value Taken Time  BP 106/71 07/19/21 1510  Temp    Pulse 87 07/19/21 1511  Resp 5 07/19/21 1511  SpO2 97 % 07/19/21 1511  Vitals shown include unvalidated device data.  Last Pain:  Vitals:   07/19/21 1301  TempSrc: Temporal  PainSc: 7       Patients Stated Pain Goal: 2 ([id number])  Complications: No notable events documented.

## 2021-07-19 NOTE — Progress Notes (Signed)
Progress Note  Patient Name: [name] [name] Date of Encounter: 07/19/2021  [facility] Cardiologist: Jenean Lindau, MD  [facility] Dr. Haroldine Laws   Subjective   Pt in endo  Inpatient Medications    Scheduled Meds:  dapagliflozin propanediol  10 mg Oral QHS   furosemide  40 mg Oral Daily   potassium chloride SA  20 mEq Oral Daily   rosuvastatin  40 mg Oral QHS   spironolactone  25 mg Oral QHS   Continuous Infusions:  lactated ringers 75 mL/hr at 07/18/21 1539   piperacillin-tazobactam (ZOSYN)  IV 3.375 g (07/19/21 0852)   PRN Meds: acetaminophen **OR** acetaminophen, HYDROmorphone (DILAUDID) injection, insulin aspart   Vital Signs    Vitals:   07/18/21 1658 07/18/21 2012 07/19/21 0209 07/19/21 0514  BP: 111/80 114/84  114/85  Pulse: 87 87  85  Resp: '20 20  20  '$ Temp: (!) 97.1 F (36.2 C) 97.6 F (36.4 C)  97.8 F (36.6 C)  TempSrc: Oral Axillary  Axillary  SpO2: 96% 96%  96%  Weight:  99.4 kg 99.4 kg   Height:        Intake/Output Summary (Last 24 hours) at 07/19/2021 1215 Last data filed at 07/19/2021 1108 Gross per 24 hour  Intake 1723.77 ml  Output 1770 ml  Net -46.23 ml   Last 3 Weights 07/19/2021 07/18/2021 07/18/2021  Weight (lbs) 219 lb 2.2 oz 219 lb 2.2 oz 222 lb 10.6 oz  Weight (kg) 99.4 kg 99.4 kg 101 kg      Telemetry    Was in SR but now looks like atrial fib  - Personally Reviewed  ECG    No new - Personally Reviewed  Physical Exam   Pt not available for exam  Labs    High Sensitivity Troponin:   Recent Labs  Lab 07/12/21 2118 07/12/21 2352  TROPONINIHS 215* 186*      Chemistry Recent Labs  Lab 07/17/21 1848 07/18/21 0210 07/19/21 0513  NA 133* 133* 135  K 4.2 4.0 4.2  CL 96* 96* 101  CO2 '25 24 25  '$ GLUCOSE 128* 98 121*  BUN 44* 43* 40*  CREATININE 2.00* 2.01* 2.12*  CALCIUM 9.4 9.3 9.3  PROT 7.0 6.4* 6.4*  ALBUMIN 3.3* 3.1* 2.8*  AST 51* 49* 53*  ALT '23 21 24  '$ ALKPHOS 171* 169* 210*  BILITOT 4.1* 3.7* 3.4*   GFRNONAA 36* 35* 33*  ANIONGAP '12 13 9     '$ Hematology Recent Labs  Lab 07/17/21 1848 07/18/21 0210 07/19/21 0513  WBC 16.5* 14.2* 12.3*  RBC 6.47* 6.18* 6.33*  HGB 16.8 16.2 16.1  HCT 53.2* 49.0 50.7  MCV 82.2 79.3* 80.1  MCH 26.0 26.2 25.4*  MCHC 31.6 33.1 31.8  RDW 21.2* 21.0* 21.6*  PLT 88* 82* 86*    BNP Recent Labs  Lab 07/12/21 2118  BNP 700.9*     DDimer No results for input(s): DDIMER in the last 168 hours.   Radiology    CT ABDOMEN PELVIS WO CONTRAST  Result Date: 07/18/2021 CLINICAL DATA:  Abdominal pain x3 days. EXAM: CT ABDOMEN AND PELVIS WITHOUT CONTRAST TECHNIQUE: Multidetector CT imaging of the abdomen and pelvis was performed following the standard protocol without IV contrast. COMPARISON:  None. FINDINGS: Lower chest: Moderate severity atelectasis and/or infiltrate is seen within the posterior aspects of the bilateral lung bases. There are small bilateral pleural effusions. Hepatobiliary: No focal liver abnormality is seen. The gallbladder is markedly distended with diffuse gallbladder wall  thickening and pericholecystic inflammation. A 2 mm gallstone is suspected within the distal common bile duct (axial CT image 39, CT series 2). Pancreas: Mild inflammatory fat stranding is seen along the posterior aspect of the tail of the pancreas. The pancreatic parenchyma is otherwise normal in appearance. Spleen: Normal in size without focal abnormality. Adrenals/Urinary Tract: Adrenal glands are unremarkable. Kidneys are normal in size, without obstructing renal calculi or hydronephrosis. A 9 mm nonobstructing renal stone is seen within the mid right kidney. A 1.4 cm diameter cyst is seen within the anterior aspect of the mid right kidney. Bladder is unremarkable. Stomach/Bowel: Stomach is within normal limits. Appendix appears normal. No evidence of bowel wall thickening, distention, or inflammatory changes. Vascular/Lymphatic: Aortic atherosclerosis. No enlarged  abdominal or pelvic lymph nodes. Reproductive: Prostate is unremarkable. Other: No abdominal wall hernia or abnormality. A small amount of abdominal and pelvic free fluid is seen with diffuse nonspecific mesenteric inflammatory fat stranding noted. Musculoskeletal: Degenerative changes are seen within the lumbar spine, most prominent at the level of L5-S1. IMPRESSION: 1. Findings consistent with acute cholecystitis with a tiny gallstone noted within the distal common bile duct. 2. Mild acute pancreatitis along the tail of the pancreas can not be excluded. Correlation with pancreatic enzymes is recommended. 3. 9 mm nonobstructing renal stone within the right kidney. Electronically Signed   By: Virgina Norfolk M.D.   On: 07/18/2021 00:02   DG CHEST PORT 1 VIEW  Result Date: 07/18/2021 CLINICAL DATA:  Weakness EXAM: PORTABLE CHEST 1 VIEW COMPARISON:  07/12/2021 FINDINGS: Cardiac shadow is enlarged but stable. Increasing right basilar atelectasis is noted. Left lung remains clear. No bony abnormality is noted. IMPRESSION: Increasing right basilar atelectasis. Electronically Signed   By: Inez Catalina M.D.   On: 07/18/2021 14:36   US Abdomen Limited RUQ (LIVER/GB)  Result Date: 07/17/2021 CLINICAL DATA:  Right upper quadrant pain for 4 days. EXAM: ULTRASOUND ABDOMEN LIMITED RIGHT UPPER QUADRANT COMPARISON:  None. FINDINGS: Gallbladder: The gallbladder wall is thickened and mildly irregular. Sludge is identified. At least one 6 mm stone is noted. Pericholecystic fluid is identified. No Murphy's sign reported. Common bile duct: Diameter: 3 mm Liver: No focal lesion identified. Within normal limits in parenchymal echogenicity. Portal vein is patent on color Doppler imaging with normal direction of blood flow towards the liver. Other: None. IMPRESSION: 1. The gallbladder is abnormal in appearance with a mildly irregular thickened wall. There is sludge in the gallbladder as well as at least 1 stone. Pericholecystic  fluid is noted. No Murphy's sign. If the clinical picture remains ambiguous, recommend a HIDA scan. 2. A small amount of free fluid is seen in the abdomen. 3. No other acute abnormalities. Electronically Signed   By: Dorise Bullion III M.D.   On: 07/17/2021 21:01    Cardiac Studies   Echo 07/19/21 IMPRESSIONS     1. Marked LV thickening and hyperechogenic myocardium strongly suggestive  of infiltrative cardiomyopathy (e.g. amyloidosis). There is superimposed  ischemic cardiomyopathy. Left ventricular ejection fraction, by  estimation, is 20 to 25%. The left  ventricle has severely decreased function. The left ventricle demonstrates  global hypokinesis. There is moderate concentric left ventricular  hypertrophy. Left ventricular diastolic function could not be evaluated.  There is the interventricular septum is  flattened in systole and diastole, consistent with right ventricular  pressure and volume overload. There is global hypokinesis with  disporportionately severe inferior and inferolateral hypokinesis,  consistent with scar in the right coronary artery  territory.   2. Right ventricular systolic function is severely reduced. Tricuspid  regurgitation signal is inadequate for assessing PA pressure.   3. Left atrial size was mild to moderately dilated.   4. Right atrial size was severely dilated.   5. The pericardial effusion is localized near the right atrium.   6. The mitral valve is normal in structure. No evidence of mitral valve  regurgitation. No evidence of mitral stenosis.   7. The aortic valve is normal in structure. There is mild thickening of  the aortic valve. Aortic valve regurgitation is not visualized. Mild  aortic valve sclerosis is present, with no evidence of aortic valve  stenosis.   8. The inferior vena cava is dilated in size with <50% respiratory  variability, suggesting right atrial pressure of 15 mmHg.   Patient Profile     70 y.o. male  hx of CAD,  NSTEMI 2009 with PCI of RCA with overlapping DES stents, HTN ,HLD, DM and chronic systolic CHF  who is being seen 07/18/2021 now admitted with acute chole with need for surgery.  Assessment & Plan    Pre-op eval for cholecystectomy with acute cholecystis- in pt with stable CAD but last EF  20-25% - will check PCXR  , elevated troponins a week ago seem to be chronic and flat with recent cardiac cath in June. Prior stents to RCA in 2009 were patent, he had non obstructive disease otherwise. With NICM would be mod to high risk but pt is stable. Will defer to Dr. Gardiner Rhyme for further comments.  Dr. Gardiner Rhyme to see  EF still 20-25% ? Amyloidosis.  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  Currently with no JVD will repeat CXR.  (HF meds spiro  25 mg daily , farxiga 10 mg daily, lasix 40 daily, K+ 20 meq daily  will check limited echo.  No ton ACE/ARB/ARN due to CKD PAF currently in SR with 1st degree AV block, one episode of SVT and does have PVCs.  Last dose of eliquis on Saturday.  Would resume when safe post op. Defer to Dr. Gardiner Rhyme if needs crossover but I do not see hx CVA--no bleeding on eliquis CAD with hx of NSTEMI in 2009 and overlapping stents to RCA, otherwise non obstructive disease with cath 04/2021. No acute chest pain to seem increase of disease.  In SR 1st degree AV block with PSVT short burst.  No BB due to 2nd degree type 1 block on last admit in 04/2021-No AV nodal blocking agents.   Acute chole per surgery/GI and IM CKD stage 3B baseline Cr 1.77 Erythrocytosis/thrombocytopenia per hematology with Dr. Jorje Guild has rec'd periodic phlebotomy in past.  DM-2 per IM on farxiga   HLD on crestor continue  Has follow up AHF appt 07/26/21      For questions or updates, please contact [facility] Please consult [email or web address] for contact info under        Signed, Cecilie Kicks, NP  07/19/2021, 12:15 PM

## 2021-07-19 NOTE — Interval H&P Note (Signed)
History and Physical Interval Note:  07/19/2021 1:33 PM  [name] [name]  has presented today for surgery, with the diagnosis of Choledocholithiasis.  The various methods of treatment have been discussed with the patient and family. After consideration of risks, benefits and other options for treatment, the patient has consented to  Procedure(s): ENDOSCOPIC RETROGRADE CHOLANGIOPANCREATOGRAPHY (ERCP) (N/A) as a surgical intervention.  The patient's history has been reviewed, patient examined, no change in status, stable for surgery.  I have reviewed the patient's chart and labs.  Questions were answered to the patient's satisfaction.     Manvir Prabhu D

## 2021-07-20 ENCOUNTER — Encounter (HOSPITAL_COMMUNITY): Payer: Self-pay | Admitting: Certified Registered"

## 2021-07-20 ENCOUNTER — Encounter (HOSPITAL_COMMUNITY): Payer: Self-pay | Admitting: Family Medicine

## 2021-07-20 ENCOUNTER — Ambulatory Visit: Payer: Medicare Other | Admitting: Orthopaedic Surgery

## 2021-07-20 ENCOUNTER — Encounter (HOSPITAL_COMMUNITY)
Admission: EM | Disposition: A | Discharge status: Skilled Nursing Facility | Payer: Self-pay | Source: Home / Self Care | Attending: Internal Medicine

## 2021-07-20 DIAGNOSIS — K8050 Calculus of bile duct without cholangitis or cholecystitis without obstruction: Secondary | ICD-10-CM

## 2021-07-20 DIAGNOSIS — I5022 Chronic systolic (congestive) heart failure: Secondary | ICD-10-CM | POA: Diagnosis not present

## 2021-07-20 DIAGNOSIS — K8080 Other cholelithiasis without obstruction: Secondary | ICD-10-CM | POA: Diagnosis not present

## 2021-07-20 DIAGNOSIS — E119 Type 2 diabetes mellitus without complications: Secondary | ICD-10-CM

## 2021-07-20 DIAGNOSIS — I480 Paroxysmal atrial fibrillation: Secondary | ICD-10-CM

## 2021-07-20 DIAGNOSIS — I5043 Acute on chronic combined systolic (congestive) and diastolic (congestive) heart failure: Secondary | ICD-10-CM | POA: Diagnosis not present

## 2021-07-20 DIAGNOSIS — N179 Acute kidney failure, unspecified: Secondary | ICD-10-CM | POA: Diagnosis not present

## 2021-07-20 DIAGNOSIS — Z01810 Encounter for preprocedural cardiovascular examination: Secondary | ICD-10-CM | POA: Diagnosis not present

## 2021-07-20 DIAGNOSIS — K810 Acute cholecystitis: Secondary | ICD-10-CM | POA: Diagnosis not present

## 2021-07-20 LAB — COMPREHENSIVE METABOLIC PANEL
ALT: 24 U/L (ref 0–44)
AST: 57 U/L — ABNORMAL HIGH (ref 15–41)
Albumin: 2.7 g/dL — ABNORMAL LOW (ref 3.5–5.0)
Alkaline Phosphatase: 277 U/L — ABNORMAL HIGH (ref 38–126)
Anion gap: 9 (ref 5–15)
BUN: 47 mg/dL — ABNORMAL HIGH (ref 8–23)
CO2: 27 mmol/L (ref 22–32)
Calcium: 9.5 mg/dL (ref 8.9–10.3)
Chloride: 99 mmol/L (ref 98–111)
Creatinine, Ser: 2.47 mg/dL — ABNORMAL HIGH (ref 0.61–1.24)
GFR, Estimated: 28 mL/min — ABNORMAL LOW (ref >60)
Glucose, Bld: 176 mg/dL — ABNORMAL HIGH (ref 70–99)
Potassium: 4.6 mmol/L (ref 3.5–5.1)
Sodium: 135 mmol/L (ref 135–145)
Total Bilirubin: 3.2 mg/dL — ABNORMAL HIGH (ref 0.3–1.2)
Total Protein: 6.4 g/dL — ABNORMAL LOW (ref 6.5–8.1)

## 2021-07-20 LAB — GLUCOSE, CAPILLARY
Glucose-Capillary: 159 mg/dL — ABNORMAL HIGH (ref 70–99)
Glucose-Capillary: 163 mg/dL — ABNORMAL HIGH (ref 70–99)
Glucose-Capillary: 174 mg/dL — ABNORMAL HIGH (ref 70–99)
Glucose-Capillary: 200 mg/dL — ABNORMAL HIGH (ref 70–99)
Glucose-Capillary: 214 mg/dL — ABNORMAL HIGH (ref 70–99)
Glucose-Capillary: 217 mg/dL — ABNORMAL HIGH (ref 70–99)

## 2021-07-20 LAB — CBC WITH DIFFERENTIAL/PLATELET
Abs Immature Granulocytes: 0.06 10*3/uL (ref 0.00–0.07)
Basophils Absolute: 0 10*3/uL (ref 0.0–0.1)
Basophils Relative: 0 %
Eosinophils Absolute: 0 10*3/uL (ref 0.0–0.5)
Eosinophils Relative: 0 %
HCT: 49.5 % (ref 39.0–52.0)
Hemoglobin: 15.9 g/dL (ref 13.0–17.0)
Immature Granulocytes: 1 %
Lymphocytes Relative: 4 %
Lymphs Abs: 0.4 10*3/uL — ABNORMAL LOW (ref 0.7–4.0)
MCH: 26 pg (ref 26.0–34.0)
MCHC: 32.1 g/dL (ref 30.0–36.0)
MCV: 80.9 fL (ref 80.0–100.0)
Monocytes Absolute: 0.5 10*3/uL (ref 0.1–1.0)
Monocytes Relative: 6 %
Neutro Abs: 7.8 10*3/uL — ABNORMAL HIGH (ref 1.7–7.7)
Neutrophils Relative %: 89 %
Platelets: 106 10*3/uL — ABNORMAL LOW (ref 150–400)
RBC: 6.12 MIL/uL — ABNORMAL HIGH (ref 4.22–5.81)
RDW: 21.6 % — ABNORMAL HIGH (ref 11.5–15.5)
WBC: 8.8 10*3/uL (ref 4.0–10.5)
nRBC: 0 % (ref 0.0–0.2)

## 2021-07-20 LAB — SURGICAL PCR SCREEN
MRSA, PCR: NEGATIVE
Staphylococcus aureus: NEGATIVE

## 2021-07-20 LAB — MAGNESIUM: Magnesium: 2.2 mg/dL (ref 1.7–2.4)

## 2021-07-20 LAB — LACTIC ACID, PLASMA: Lactic Acid, Venous: 2 mmol/L (ref 0.5–1.9)

## 2021-07-20 LAB — LIPASE, BLOOD: Lipase: 63 U/L — ABNORMAL HIGH (ref 11–51)

## 2021-07-20 SURGERY — LAPAROSCOPIC CHOLECYSTECTOMY
Anesthesia: General

## 2021-07-20 MED ORDER — ASPIRIN EC 81 MG PO TBEC
81.0000 mg | DELAYED_RELEASE_TABLET | Freq: Every day | ORAL | Status: DC
  Administered 2021-07-20: 81 mg via ORAL
  Filled 2021-07-20: qty 1

## 2021-07-20 MED ORDER — FUROSEMIDE 10 MG/ML IJ SOLN
40.0000 mg | Freq: Two times a day (BID) | INTRAMUSCULAR | Status: DC
  Administered 2021-07-20: 40 mg via INTRAVENOUS
  Filled 2021-07-20: qty 4

## 2021-07-20 MED ORDER — LACTATED RINGERS IV SOLN: INTRAVENOUS | Status: DC

## 2021-07-20 MED ORDER — FUROSEMIDE 10 MG/ML IJ SOLN
40.0000 mg | Freq: Two times a day (BID) | INTRAMUSCULAR | Status: DC
  Administered 2021-07-20 – 2021-07-21 (×2): 40 mg via INTRAVENOUS
  Filled 2021-07-20 (×2): qty 4

## 2021-07-20 MED ORDER — CHLORHEXIDINE GLUCONATE 0.12 % MT SOLN
15.0000 mL | Freq: Once | OROMUCOSAL | Status: AC
  Administered 2021-07-20: 15 mL via OROMUCOSAL

## 2021-07-20 MED ORDER — APIXABAN 5 MG PO TABS
5.0000 mg | ORAL_TABLET | Freq: Two times a day (BID) | ORAL | Status: DC
  Administered 2021-07-20 – 2021-07-24 (×8): 5 mg via ORAL
  Filled 2021-07-20 (×8): qty 1

## 2021-07-20 NOTE — Progress Notes (Addendum)
[facility]  PROGRESS NOTE  [name] [name] [id number] DOB: 1951/02/04 DOA: 07/17/2021 PCP: Raeanne Gathers, MD   Brief HPI:   70 year old male with history of diabetes mellitus type 2, chronic systolic CHF, CKD stage III, paroxysmal atrial fibrillation, CAD, hypertension, osteoarthritis, patient presented with abdominal pain and diarrhea.  On presentation was found to have acute cholecystitis with choledocholithiasis and mild pancreatitis along with leukocytosis elevated LFTs.  He was started on broad-spectrum antibiotics.  GI and general surgery were consulted.    Subjective   This morning patient was supposed to get cholecystectomy however surgery was canceled as cardiology did not recommend patient to undergo surgery due to risk involved with surgery.   Assessment/Plan:     Acute calculus cholecystitis/choledocholithiasis/biliary pancreatitis -Patient underwent ERCP which showed choledocholithiasis, underwent stone removal with sphincterotomy -Cholecystectomy was scheduled for this morning however it was canceled after cardiology recommended against it -General surgery has signed off and will follow patient in their office  Acute on chronic combined systolic and diastolic heart failure -Patient has EF of 20 to 25%, severe RV dysfunction, findings concerning for amyloid -Cardiology has ordered PYP scan to evaluate for amyloid, SPEP/UPEP/light chains -Started on IV Lasix for fluid overload  Paroxysmal atrial fibrillation -We will restart Eliquis; will discontinue aspirin as Eliquis has been restarted -Beta-blocker on hold considering CHF as above  CAD s/p NSTEMI in 2008 with stent to RCA and LAD -Continue statin, aspirin  Diabetes mellitus type 2 -[place] is currently on hold due to acute kidney injury -Continue sliding scale insulin with NovoLog   Acute kidney injury on CKD stage IIIb -Patient baseline creatinine is around 1.8 -Presented with  creatinine of 2.02, which has worsened to 2.47 this morning -Likely from CHF exacerbation, also patient became hypotensive yesterday during ERCP requiring pressor support -He has been started on IV Lasix as above.  Follow BMP in am  Scheduled medications:    aspirin EC  81 mg Oral Daily   furosemide  40 mg Intravenous BID   rosuvastatin  40 mg Oral QHS         Data Reviewed:   CBG:  Recent Labs  Lab 07/20/21 0451 07/20/21 0721 07/20/21 0928 07/20/21 1150 07/20/21 1816  GLUCAP 217* 214* 159* 163* 200*    SpO2: 97 % O2 Flow Rate (L/min): 2 L/min    Vitals:   07/20/21 0343 07/20/21 0924 07/20/21 0939 07/20/21 0946  BP: 113/75 116/83    Pulse: 68 69    Resp: 20 17    Temp: 97.8 F (36.6 C) (!) 97.5 F (36.4 C)    TempSrc: Axillary Axillary    SpO2: 99% 97%  97%  Weight:   102.7 kg   Height:   '6\' 1"'$  (1.854 m)      Intake/Output Summary (Last 24 hours) at 07/20/2021 1845 Last data filed at 07/20/2021 1523 Gross per 24 hour  Intake 455 ml  Output 1000 ml  Net -545 ml    08/29 1901 - 08/31 0700 In: 2298.2 [I.V.:2113] Out: 1923 [Urine:1920]  Filed Weights   07/19/21 1953 07/20/21 0101 07/20/21 0939  Weight: 102.7 kg 102.7 kg 102.7 kg    CBC:  Recent Labs  Lab 07/17/21 1848 07/18/21 0210 07/19/21 0513 07/20/21 0547  WBC 16.5* 14.2* 12.3* 8.8  HGB 16.8 16.2 16.1 15.9  HCT 53.2* 49.0 50.7 49.5  PLT 88* 82* 86* 106*  MCV 82.2 79.3* 80.1 80.9  MCH 26.0 26.2 25.4* 26.0  MCHC 31.6 33.1 31.8 32.1  RDW 21.2* 21.0* 21.6* 21.6*  LYMPHSABS 0.4*  --  0.5* 0.4*  MONOABS 1.4*  --  1.2* 0.5  EOSABS 0.0  --  0.0 0.0  BASOSABS 0.0  --  0.0 0.0    Complete metabolic panel:  Recent Labs  Lab 07/17/21 1848 07/18/21 0210 07/19/21 0513 07/20/21 0547 07/20/21 1354  NA 133* 133* 135 135  --   K 4.2 4.0 4.2 4.6  --   CL 96* 96* 101 99  --   CO2 '25 24 25 27  '$ --   GLUCOSE 128* 98 121* 176*  --   BUN 44* 43* 40* 47*  --   CREATININE 2.00* 2.01* 2.12*  2.47*  --   CALCIUM 9.4 9.3 9.3 9.5  --   AST 51* 49* 53* 57*  --   ALT '23 21 24 24  '$ --   ALKPHOS 171* 169* 210* 277*  --   BILITOT 4.1* 3.7* 3.4* 3.2*  --   ALBUMIN 3.3* 3.1* 2.8* 2.7*  --   MG  --   --  2.2 2.2  --   LATICACIDVEN  --   --   --   --  2.0*  HGBA1C  --  6.6*  --   --   --     Recent Labs  Lab 07/17/21 1848 07/19/21 0513 07/20/21 0547  LIPASE 105* 40 63*    Recent Labs  Lab 07/17/21 2001  SARSCOV2NAA NEGATIVE    ------------------------------------------------------------------------------------------------------------------ No results for input(s): CHOL, HDL, LDLCALC, TRIG, CHOLHDL, LDLDIRECT in the last 72 hours.  Lab Results  Component Value Date   HGBA1C 6.6 (H) 07/18/2021   ------------------------------------------------------------------------------------------------------------------ No results for input(s): TSH, T4TOTAL, T3FREE, THYROIDAB in the last 72 hours.  Invalid input(s): FREET3 ------------------------------------------------------------------------------------------------------------------ No results for input(s): VITAMINB12, FOLATE, FERRITIN, TIBC, IRON, RETICCTPCT in the last 72 hours.  Coagulation profile No results for input(s): INR, PROTIME in the last 168 hours. No results for input(s): DDIMER in the last 72 hours.  Cardiac Enzymes No results for input(s): CKTOTAL, CKMB, CKMBINDEX, TROPONINI in the last 168 hours.  ------------------------------------------------------------------------------------------------------------------    Component Value Date/Time   BNP 700.9 (H) 07/12/2021 2118     Antibiotics: Anti-infectives (From admission, onward)    Start     Dose/Rate Route Frequency Ordered Stop   07/18/21 0800  piperacillin-tazobactam (ZOSYN) IVPB 3.375 g        3.375 g 12.5 mL/hr over 240 Minutes Intravenous Every 8 hours 07/18/21 0142     07/18/21 0015  piperacillin-tazobactam (ZOSYN) IVPB 3.375 g        3.375  g 100 mL/hr over 30 Minutes Intravenous  Once 07/18/21 0007 07/18/21 0129        Radiology Reports  DG Chest Port 1 View  Result Date: 07/19/2021 CLINICAL DATA:  Respiratory distress. EXAM: PORTABLE CHEST 1 VIEW COMPARISON:  July 18, 2021 FINDINGS: Mild, stable areas of linear scarring and/or atelectasis are seen within the bilateral lung bases. There is no evidence of a pleural effusion or pneumothorax. The cardiac silhouette is enlarged and unchanged in size. The visualized skeletal structures are unremarkable. IMPRESSION: Mild, stable bibasilar linear scarring and/or atelectasis. Electronically Signed   By: Virgina Norfolk M.D.   On: 07/19/2021 17:07   DG ERCP  Result Date: 07/19/2021 CLINICAL DATA:  Gallstone pancreatitis EXAM: ERCP TECHNIQUE: Multiple spot images obtained with the fluoroscopic device and submitted for interpretation post-procedure. FLUOROSCOPY TIME:  Fluoroscopy Time:  5 minutes 52 seconds Number of Acquired Spot Images: 0  COMPARISON:  CT abdomen/pelvis 07/17/2021 FINDINGS: A total of 22 intraoperative spot images are submitted for review. The images demonstrate a flexible duodenal scope in the descending duodenum with wire cannulation of the main pancreatic duct and common bile duct. Subsequent images demonstrate cholangiography, sphincterotomy and balloon sweeping of the common duct. IMPRESSION: ERCP with sphincterotomy and balloon sweeping of the common duct. These images were submitted for radiologic interpretation only. Please see the procedural report for the amount of contrast and the fluoroscopy time utilized. Electronically Signed   By: Jacqulynn Cadet M.D.   On: 07/19/2021 15:11   ECHOCARDIOGRAM LIMITED  Result Date: 07/19/2021    ECHOCARDIOGRAM LIMITED REPORT   Patient Name:   [name] [name] Date of Exam: 07/19/2021 Medical Rec #:  [id number]          Height:       73.0 in Accession #:    [id number]         Weight:       219.1 lb Date of Birth:  Oct 15, 1951           BSA:          2.237 m Patient Age:    64 years           BP:           114/85 mmHg Patient Gender: M                  HR:           92 bpm. Exam Location:  Inpatient Procedure: Limited Echo, Limited Color Doppler and Cardiac Doppler Indications:    R94.31 Abnormal EKG  History:        Patient has prior history of Echocardiogram examinations, most                 recent 05/06/2021. CHF and Idiopathic CMP, Previous Myocardial                 Infarction and CAD, Arrythmias:Atrial Fibrillation; Risk                 Factors:Diabetes, Hypertension, Former Smoker and Dyslipidemia.  Sonographer:    Bernadene Person RDCS Referring Phys: [place]  1. 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 Left ventricular ejection fraction, by estimation, is 20 to 25%. The left ventricle has severely decreased function. The left ventricle demonstrates global hypokinesis. There is moderate concentric left ventricular hypertrophy. Left ventricular diastolic function could not be evaluated. There is the interventricular septum is flattened in systole and diastole, consistent with right ventricular pressure and volume overload. There is global hypokinesis with disporportionately severe inferior and inferolateral hypokinesis, consistent with scar in the right coronary artery territory.  2. Right ventricular systolic function is severely reduced. Tricuspid regurgitation signal is inadequate for assessing PA pressure.  3. Left atrial size was mild to moderately dilated.  4. Right atrial size was severely dilated.  5. The pericardial effusion is localized near the right atrium.  6. The mitral valve is normal in structure. No evidence of mitral valve regurgitation. No evidence of mitral stenosis.  7. The aortic valve is normal in structure. There is mild thickening of the aortic valve. Aortic  valve regurgitation is not visualized. Mild aortic valve sclerosis is present, with no evidence of aortic valve stenosis.  8. The inferior vena cava is dilated in size with <50% respiratory variability, suggesting right  atrial pressure of 15 mmHg. Comparison(s): The rhythm is now atrial fibrillation. Otherwise, No significant change from prior study. Prior images reviewed side by side. 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 There is evidence of severely reduced cardiac output. 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 Left ventricular ejection fraction, by estimation, is 20 to 25%. The  left ventricle has severely decreased function. The left ventricle demonstrates global hypokinesis. There is moderate concentric left ventricular hypertrophy. The interventricular septum is flattened in systole and diastole, consistent with right ventricular pressure and volume overload. Left ventricular diastolic function could not be evaluated. Left ventricular diastolic function could not be evaluated due to atrial fibrillation.  LV Wall Scoring: The inferior wall, mid inferoseptal segment, and basal inferoseptal segment are akinetic. The entire anterior wall, entire lateral wall, entire anterior septum, and entire apex are hypokinetic. There is global hypokinesis with disporportionately severe inferior and inferolateral hypokinesis, consistent with scar in the right coronary artery territory. Right Ventricle: Right ventricular systolic function is severely reduced. Tricuspid regurgitation signal is inadequate for assessing PA pressure. Left Atrium: Left atrial size was mild to moderately dilated. Right Atrium: Right atrial size was severely  dilated. Pericardium: Trivial pericardial effusion is present. The pericardial effusion is localized near the right atrium. Mitral Valve: The mitral valve is normal in structure. No evidence of mitral valve stenosis. Tricuspid Valve: Tricuspid valve regurgitation is mild. Aortic Valve: The aortic valve is normal in structure. There is mild thickening of the aortic valve. Aortic valve regurgitation is not visualized. Mild aortic valve sclerosis is present, with no evidence of aortic valve stenosis. Pulmonic Valve: The pulmonic valve was grossly normal. Pulmonic valve regurgitation is not visualized. Venous: The inferior vena cava is dilated in size with less than 50% respiratory variability, suggesting right atrial pressure of 15 mmHg. Additional Comments: There is pleural effusion in the left lateral region. Moderate ascites is present. LEFT VENTRICLE PLAX 2D LVIDd:         5.00 cm LVIDs:         4.40 cm LV PW:         1.60 cm LV IVS:        1.70 cm LVOT diam:     2.10 cm LV SV:         27 LV SV Index:   12 LVOT Area:     3.46 cm  LV Volumes (MOD) LV vol d, MOD A2C: 151.0 ml LV vol d, MOD A4C: 152.0 ml LV vol s, MOD A2C: 119.0 ml LV vol s, MOD A4C: 105.0 ml LV SV MOD A2C:     32.0 ml LV SV MOD A4C:     47.0 ml LV SV MOD BP:      41.3 ml RIGHT VENTRICLE TAPSE (M-mode): 1.0 cm LEFT ATRIUM           Index       RIGHT ATRIUM           Index LA diam:      4.30 cm 1.92 cm/m  RA Area:     30.76 cm 13.75 cm/m LA Vol (A4C): 57.8 ml 25.82 ml/m RA Volume:   120.58 ml 53.91 ml/m  AORTIC VALVE LVOT Vmax:   61.17 cm/s LVOT Vmean:  35.800 cm/s LVOT VTI:    0.078 m  AORTA Ao  Root diam: 3.70 cm  SHUNTS Systemic VTI:  0.08 m Systemic Diam: 2.10 cm Dani Gobble Croitoru MD Electronically signed by Sanda Klein MD Signature Date/Time: 07/19/2021/1:09:52 PM    Final       DVT prophylaxis: SCDs  Code Status: Full code  Family Communication: No family at bedside   Consultants: Gastroenterology General  surgery  Procedures:     Objective    Physical Examination:   General-appears in no acute distress Heart-S1-S2, regular, no murmur auscultated Lungs-bilateral crackles auscultated Abdomen-soft, nontender, no organomegaly Extremities-no edema in the lower extremities Neuro-alert, oriented x3, no focal deficit noted  Status is: Inpatient  Dispo: The patient is from: Home              Anticipated d/c is to: Home              Anticipated d/c date is: 07/21/2021              Patient currently not stable for discharge  Barrier to discharge-cholecystitis  COVID-19 Labs  No results for input(s): DDIMER, FERRITIN, LDH, CRP in the last 72 hours.  Lab Results  Component Value Date   [place] NEGATIVE 07/17/2021   [place] NEGATIVE 05/04/2021   [place] NEGATIVE 11/30/2020    Microbiology  Recent Results (from the past 240 hour(s))  Resp Panel by RT-PCR (Flu A&B, Covid) Nasopharyngeal Swab     Status: None   Collection Time: 07/17/21  8:01 PM   Specimen: Nasopharyngeal Swab; Nasopharyngeal(NP) swabs in vial transport medium  Result Value Ref Range Status   SARS Coronavirus 2 by RT PCR NEGATIVE NEGATIVE Final    Comment: (NOTE) SARS-CoV-2 target nucleic acids are NOT DETECTED.  The SARS-CoV-2 RNA is generally detectable in upper respiratory specimens during the acute phase of infection. The lowest concentration of SARS-CoV-2 viral copies this assay can detect is 138 copies/mL. A negative result does not preclude SARS-Cov-2 infection and should not be used as the sole basis for treatment or other patient management decisions. A negative result may occur with  improper specimen collection/handling, submission of specimen other than nasopharyngeal swab, presence of viral mutation(s) within the areas targeted by this assay, and inadequate number of viral copies(<138 copies/mL). A negative result must be combined with clinical observations, patient history, and  epidemiological information. The expected result is Negative.  Fact Sheet for Patients:  EntrepreneurPulse.com.au  Fact Sheet for Healthcare Providers:  IncredibleEmployment.be  This test is no t yet approved or cleared by the [place] FDA and  has been authorized for detection and/or diagnosis of SARS-CoV-2 by FDA under an Emergency Use Authorization (EUA). This EUA will remain  in effect (meaning this test can be used) for the duration of the COVID-19 declaration under Section 564(b)(1) of the Act, 21 U.S.C.section 360bbb-3(b)(1), unless the authorization is terminated  or revoked sooner.       Influenza A by PCR NEGATIVE NEGATIVE Final   Influenza B by PCR NEGATIVE NEGATIVE Final    Comment: (NOTE) The Xpert Xpress SARS-CoV-2/FLU/RSV plus assay is intended as an aid in the diagnosis of influenza from Nasopharyngeal swab specimens and should not be used as a sole basis for treatment. Nasal washings and aspirates are unacceptable for Xpert Xpress SARS-CoV-2/FLU/RSV testing.  Fact Sheet for Patients: EntrepreneurPulse.com.au  Fact Sheet for Healthcare Providers: IncredibleEmployment.be  This test is not yet approved or cleared by the [place] FDA and has been authorized for detection and/or diagnosis of SARS-CoV-2 by FDA under an Emergency Use Authorization (EUA).  This EUA will remain in effect (meaning this test can be used) for the duration of the COVID-19 declaration under Section 564(b)(1) of the Act, 21 U.S.C. section 360bbb-3(b)(1), unless the authorization is terminated or revoked.  Performed at [facility], [place] [address], [place], [place] [address]   Urine Culture     Status: None   Collection Time: 07/17/21 11:58 PM   Specimen: Urine, Clean Catch  Result Value Ref Range Status   Specimen Description   Final    URINE, CLEAN CATCH Performed at [facility], [place] [address], [place], [place] [address]    Special Requests   Final    NONE Performed at [facility], [place] [address], [place], [place] [address]    Culture   Final    NO GROWTH Performed at [facility], [place] [address], [place], [place] [address]    Report Status 07/19/2021 FINAL  Final  Blood culture (routine x 2)     Status: None (Preliminary result)   Collection Time: 07/18/21  2:10 AM   Specimen: BLOOD  Result Value Ref Range Status   Specimen Description   Final    BLOOD LEFT ANTECUBITAL Performed at [facility] [address] [place], [place] [address]    Special Requests   Final    BOTTLES DRAWN AEROBIC ONLY Blood Culture adequate volume Performed at [facility] [address] [place], [place] [address]    Culture   Final    NO GROWTH 2 DAYS Performed at [facility] [address], [place], [place] [address]    Report Status PENDING  Incomplete  Blood culture (routine x 2)     Status: None (Preliminary result)   Collection Time: 07/18/21  2:10 AM   Specimen: BLOOD  Result Value Ref Range Status   Specimen Description   Final    BLOOD BLOOD RIGHT FOREARM Performed at [facility] [address] [place], [place] [address]    Special Requests   Final    BOTTLES DRAWN AEROBIC ONLY Blood Culture adequate volume Performed at [facility] [address] [place], [place] [address]    Culture   Final    NO GROWTH 2 DAYS Performed at [facility] [address] [place], [place] [address]    Report Status PENDING  Incomplete  Surgical pcr screen     Status: None   Collection Time: 07/20/21  9:05 AM   Specimen: Nasal Mucosa; Nasal Swab  Result Value Ref Range Status   MRSA, PCR NEGATIVE NEGATIVE Final   Staphylococcus aureus NEGATIVE NEGATIVE Final    Comment: (NOTE) The Xpert SA Assay (FDA approved for NASAL specimens  in patients 12 years of age and older), is one component of a comprehensive surveillance program. It is not intended to diagnose infection nor to guide or monitor treatment. Performed at [facility], [place] [address] [place], [place] [address]              Oswald Hillock   [facility] If 7PM-7AM, please contact night-coverage at [email or web address], Office  [phone]   07/20/2021, 6:45 PM  LOS: 2 days

## 2021-07-20 NOTE — Progress Notes (Signed)
Progress Note     Subjective: Feeling much better - denies abdominal pain, nausea/vomiting.   Objective: Vital signs in last 24 hours: Temp:  [97.3 F (36.3 C)-98.2 F (36.8 C)] 97.8 F (36.6 C) (08/31 0343) Pulse Rate:  [68-122] 68 (08/31 0343) Resp:  [6-20] 20 (08/31 0343) BP: (103-137)/(71-87) 113/75 (08/31 0343) SpO2:  [84 %-99 %] 99 % (08/31 0343) Weight:  [102.7 kg] 102.7 kg (08/31 0101) Last BM Date: 07/17/21  Intake/Output from previous day: 08/30 0701 - 08/31 0700 In: 1401.6 [I.V.:1250.8; IV Piggyback:150.9] Out: 1203 [Urine:1200; Blood:3] Intake/Output this shift: No intake/output data recorded.  PE:  General: WD, elderly male who is laying in bed in NAD HEENT: Sclera are anicteric.   Heart: regular, rate, and rhythm.  Lungs: CTAB, no wheezes, rhonchi, or rales noted.  Respiratory effort nonlabored Abd: soft, NT, ND, +BS, no masses, hernias, or organomegaly MS: all 4 extremities are symmetrical with no cyanosis, clubbing, or edema. Psych: A&Ox3 with an appropriate affect.    Lab Results:  Recent Labs    07/19/21 0513 07/20/21 0547  WBC 12.3* 8.8  HGB 16.1 15.9  HCT 50.7 49.5  PLT 86* 106*   BMET Recent Labs    07/19/21 0513 07/20/21 0547  NA 135 135  K 4.2 4.6  CL 101 99  CO2 25 27  GLUCOSE 121* 176*  BUN 40* 47*  CREATININE 2.12* 2.47*  CALCIUM 9.3 9.5   PT/INR No results for input(s): LABPROT, INR in the last 72 hours. CMP     Component Value Date/Time   NA 135 07/20/2021 0547   NA 146 (H) 05/20/2021 1654   K 4.6 07/20/2021 0547   CL 99 07/20/2021 0547   CO2 27 07/20/2021 0547   GLUCOSE 176 (H) 07/20/2021 0547   BUN 47 (H) 07/20/2021 0547   BUN 26 05/20/2021 1654   CREATININE 2.47 (H) 07/20/2021 0547   CALCIUM 9.5 07/20/2021 0547   PROT 6.4 (L) 07/20/2021 0547   PROT 7.0 09/22/2020 1512   ALBUMIN 2.7 (L) 07/20/2021 0547   ALBUMIN 4.5 09/22/2020 1512   AST 57 (H) 07/20/2021 0547   ALT 24 07/20/2021 0547   ALKPHOS 277 (H)  07/20/2021 0547   BILITOT 3.2 (H) 07/20/2021 0547   BILITOT 1.1 09/22/2020 1512   GFRNONAA 28 (L) 07/20/2021 0547   GFRAA 53 (L) 09/22/2020 1512   Lipase     Component Value Date/Time   LIPASE 63 (H) 07/20/2021 0547       Studies/Results: DG Chest Port 1 View  Result Date: 07/19/2021 CLINICAL DATA:  Respiratory distress. EXAM: PORTABLE CHEST 1 VIEW COMPARISON:  July 18, 2021 FINDINGS: Mild, stable areas of linear scarring and/or atelectasis are seen within the bilateral lung bases. There is no evidence of a pleural effusion or pneumothorax. The cardiac silhouette is enlarged and unchanged in size. The visualized skeletal structures are unremarkable. IMPRESSION: Mild, stable bibasilar linear scarring and/or atelectasis. Electronically Signed   By: Virgina Norfolk M.D.   On: 07/19/2021 17:07   DG CHEST PORT 1 VIEW  Result Date: 07/18/2021 CLINICAL DATA:  Weakness EXAM: PORTABLE CHEST 1 VIEW COMPARISON:  07/12/2021 FINDINGS: Cardiac shadow is enlarged but stable. Increasing right basilar atelectasis is noted. Left lung remains clear. No bony abnormality is noted. IMPRESSION: Increasing right basilar atelectasis. Electronically Signed   By: Inez Catalina M.D.   On: 07/18/2021 14:36   DG ERCP  Result Date: 07/19/2021 CLINICAL DATA:  Gallstone pancreatitis EXAM: ERCP TECHNIQUE: Multiple spot images  obtained with the fluoroscopic device and submitted for interpretation post-procedure. FLUOROSCOPY TIME:  Fluoroscopy Time:  5 minutes 52 seconds Number of Acquired Spot Images: 0 COMPARISON:  CT abdomen/pelvis 07/17/2021 FINDINGS: A total of 22 intraoperative spot images are submitted for review. The images demonstrate a flexible duodenal scope in the descending duodenum with wire cannulation of the main pancreatic duct and common bile duct. Subsequent images demonstrate cholangiography, sphincterotomy and balloon sweeping of the common duct. IMPRESSION: ERCP with sphincterotomy and balloon  sweeping of the common duct. These images were submitted for radiologic interpretation only. Please see the procedural report for the amount of contrast and the fluoroscopy time utilized. Electronically Signed   By: Jacqulynn Cadet M.D.   On: 07/19/2021 15:11   ECHOCARDIOGRAM LIMITED  Result Date: 07/19/2021    ECHOCARDIOGRAM LIMITED REPORT   Patient Name:   [name] [name] Date of Exam: 07/19/2021 Medical Rec #:  [id number]          Height:       73.0 in Accession #:    [id number]         Weight:       219.1 lb Date of Birth:  1951-01-02          BSA:          2.237 m Patient Age:    70 years           BP:           114/85 mmHg Patient Gender: M                  HR:           92 bpm. Exam Location:  Inpatient Procedure: Limited Echo, Limited Color Doppler and Cardiac Doppler Indications:    R94.31 Abnormal EKG  History:        Patient has prior history of Echocardiogram examinations, most                 recent 05/06/2021. CHF and Idiopathic CMP, Previous Myocardial                 Infarction and CAD, Arrythmias:Atrial Fibrillation; Risk                 Factors:Diabetes, Hypertension, Former Smoker and Dyslipidemia.  Sonographer:    Bernadene Person RDCS Referring Phys: [place]  1. 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 Left ventricular ejection fraction, by estimation, is 20 to 25%. The left ventricle has severely decreased function. The left ventricle demonstrates global hypokinesis. There is moderate concentric left ventricular hypertrophy. Left ventricular diastolic function could not be evaluated. There is the interventricular septum is flattened in systole and diastole, consistent with right ventricular pressure and volume overload. There is global hypokinesis with disporportionately severe inferior and inferolateral hypokinesis, consistent with scar in  the right coronary artery territory.  2. Right ventricular systolic function is severely reduced. Tricuspid regurgitation signal is inadequate for assessing PA pressure.  3. Left atrial size was mild to moderately dilated.  4. Right atrial size was severely dilated.  5. The pericardial effusion is localized near the right atrium.  6. The mitral valve is normal in structure. No evidence of mitral valve regurgitation. No evidence of mitral stenosis.  7. The aortic valve is normal in structure. There is mild thickening of the aortic valve. Aortic valve regurgitation is not visualized. Mild aortic valve  sclerosis is present, with no evidence of aortic valve stenosis.  8. The inferior vena cava is dilated in size with <50% respiratory variability, suggesting right atrial pressure of 15 mmHg. Comparison(s): The rhythm is now atrial fibrillation. Otherwise, No significant change from prior study. Prior images reviewed side by side. 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 There is evidence of severely reduced cardiac output. 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 Left ventricular ejection fraction, by estimation, is 20 to 25%. The  left ventricle has severely decreased function. The left ventricle demonstrates global hypokinesis. There is moderate concentric left ventricular hypertrophy. The interventricular septum is flattened in systole and diastole, consistent with right ventricular pressure and volume overload. Left ventricular diastolic function could not be evaluated. Left ventricular diastolic function could not be evaluated due to atrial fibrillation.  LV Wall Scoring: The inferior wall, mid inferoseptal segment, and basal  inferoseptal segment are akinetic. The entire anterior wall, entire lateral wall, entire anterior septum, and entire apex are hypokinetic. There is global hypokinesis with disporportionately severe inferior and inferolateral hypokinesis, consistent with scar in the right coronary artery territory. Right Ventricle: Right ventricular systolic function is severely reduced. Tricuspid regurgitation signal is inadequate for assessing PA pressure. Left Atrium: Left atrial size was mild to moderately dilated. Right Atrium: Right atrial size was severely dilated. Pericardium: Trivial pericardial effusion is present. The pericardial effusion is localized near the right atrium. Mitral Valve: The mitral valve is normal in structure. No evidence of mitral valve stenosis. Tricuspid Valve: Tricuspid valve regurgitation is mild. Aortic Valve: The aortic valve is normal in structure. There is mild thickening of the aortic valve. Aortic valve regurgitation is not visualized. Mild aortic valve sclerosis is present, with no evidence of aortic valve stenosis. Pulmonic Valve: The pulmonic valve was grossly normal. Pulmonic valve regurgitation is not visualized. Venous: The inferior vena cava is dilated in size with less than 50% respiratory variability, suggesting right atrial pressure of 15 mmHg. Additional Comments: There is pleural effusion in the left lateral region. Moderate ascites is present. LEFT VENTRICLE PLAX 2D LVIDd:         5.00 cm LVIDs:         4.40 cm LV PW:         1.60 cm LV IVS:        1.70 cm LVOT diam:     2.10 cm LV SV:         27 LV SV Index:   12 LVOT Area:     3.46 cm  LV Volumes (MOD) LV vol d, MOD A2C: 151.0 ml LV vol d, MOD A4C: 152.0 ml LV vol s, MOD A2C: 119.0 ml LV vol s, MOD A4C: 105.0 ml LV SV MOD A2C:     32.0 ml LV SV MOD A4C:     47.0 ml LV SV MOD BP:      41.3 ml RIGHT VENTRICLE TAPSE (M-mode): 1.0 cm LEFT ATRIUM           Index       RIGHT ATRIUM           Index LA diam:      4.30 cm 1.92 cm/m  RA  Area:     30.76 cm 13.75 cm/m LA Vol (A4C): 57.8 ml 25.82 ml/m RA Volume:   120.58  ml 53.91 ml/m  AORTIC VALVE LVOT Vmax:   61.17 cm/s LVOT Vmean:  35.800 cm/s LVOT VTI:    0.078 m  AORTA Ao Root diam: 3.70 cm  SHUNTS Systemic VTI:  0.08 m Systemic Diam: 2.10 cm Dani Gobble Croitoru MD Electronically signed by Sanda Klein MD Signature Date/Time: 07/19/2021/1:09:52 PM    Final     Anti-infectives: Anti-infectives (From admission, onward)    Start     Dose/Rate Route Frequency Ordered Stop   07/18/21 0800  piperacillin-tazobactam (ZOSYN) IVPB 3.375 g        3.375 g 12.5 mL/hr over 240 Minutes Intravenous Every 8 hours 07/18/21 0142     07/18/21 0015  piperacillin-tazobactam (ZOSYN) IVPB 3.375 g        3.375 g 100 mL/hr over 30 Minutes Intravenous  Once 07/18/21 0007 07/18/21 0129        Assessment/Plan Choledocholithiasis - [place] and CT representative of above - ERCP -sludge cleared and sphincterotomy performed 8/30  -The anatomy and physiology of the hepatobiliary system was discussed with him. The pathophysiology of gallbladder disease was discussed with him as well -We discussed options for treatment were discussed including ongoing observation which may result in recurrence of choledocholithiasis or other gallbladder complications (infection, pancreatitis, choledocholithiasis, etc) -With regards to his discussions with cardiology, we discussed higher risk for major cardiac events given his comorbidities but without clear things that can be done to reduce these risks. He clearly acknowledges these risks and wishes to proceed with surgery as his risk of recurrence of this condition is also fairly high. We therefore reviewed laparoscopic cholecystectomy.  -The planned procedure, material risks (including, but not limited to, pain, bleeding, infection, scarring, need for blood transfusion, damage to surrounding structures- blood vessels/nerves/viscus/organs, damage to bile duct, bile leak, need  for additional procedures, hernia, worsening of pre-existing medical conditions, pancreatitis, pneumonia, heart attack, stroke, death) benefits and alternatives to surgery were discussed at length. The patient's questions were answered to his satisfaction, he voiced understanding and elected to proceed with surgery. Additionally, we discussed typical postoperative expectations and the recovery process. He would like me to update his son Will, after surgery     FEN: NPO, IVF per primary  VTE: SCDs, eliquis on hold ID: Zosyn 8/29>>  - below per primary service -  [id number] Chronic systolic CHF CKD stage III Paroxysmal A fib on eliquis CAD HTN OA  LOS: 2 days    Ileana Roup, MD [facility] 07/20/2021, 8:15 AM Please see Amion for pager number during day hours 7:00am-4:30pm

## 2021-07-20 NOTE — Progress Notes (Signed)
Spoke to nurse,Hannah, and received report for surgery this morning.

## 2021-07-20 NOTE — Anesthesia Preprocedure Evaluation (Deleted)
Anesthesia Evaluation  Patient identified by MRN, date of birth, ID band Patient awake    Reviewed: Allergy & Precautions, NPO status , Patient's Chart, lab work & pertinent test results  Airway Mallampati: II  TM Distance: >3 FB Neck ROM: Full    Dental no notable dental hx.    Pulmonary former smoker,    Pulmonary exam normal breath sounds clear to auscultation       Cardiovascular hypertension, + CAD, + Past MI, + Cardiac Stents and +CHF  Normal cardiovascular exam+ dysrhythmias Atrial Fibrillation  Rhythm:Regular Rate:Normal     Neuro/Psych PSYCHIATRIC DISORDERS Depression negative neurological ROS     GI/Hepatic negative GI ROS, Neg liver ROS,   Endo/Other  diabetes, Insulin Dependent  Renal/GU Renal disease     Musculoskeletal  (+) Arthritis ,   Abdominal   Peds  Hematology  (+) Blood dyscrasia, , HLD Thrombocytopenia    Anesthesia Other Findings   Reproductive/Obstetrics                             Anesthesia Physical  Anesthesia Plan  ASA: 4  Anesthesia Plan: General   Post-op Pain Management:    Induction: Intravenous  PONV Risk Score and Plan: 4 or greater and Ondansetron, Dexamethasone, Treatment may vary due to age or medical condition and Midazolam  Airway Management Planned: Oral ETT  Additional Equipment: Arterial line  Intra-op Plan:   Post-operative Plan: Extubation in OR  Informed Consent: I have reviewed the patients History and Physical, chart, labs and discussed the procedure including the risks, benefits and alternatives for the proposed anesthesia with the patient or authorized representative who has indicated his/her understanding and acceptance.     Dental advisory given  Plan Discussed with: CRNA  Anesthesia Plan Comments: (Dr. Dema Severin asked me if I thought this patient was appropriate for surgery today. He stated that this is an elective  cholecystectomy to reduce risk of repeat choledocholithiasis. He was in afib with [facility] yesterday for his ERCP and had significant drop in BP post induction. Today he's been on nasal canula, which new since yesterday. I examined him in preop, he is sleepy, respirations seem slightly labored, his hands are cold and clammy, and he has bilateral crackles. He also endorses more shortness of breath than yesterday. I spoke with Dr. Dema Severin again. I said, having not met him before today, it would be difficult to say whether he is decompensating. I feel he would probably benefit from cardiology touching base with him this morning prior to proceeding to make sure he's not decompensating. If I were to meet Mr. Magnone for elective surgery coming in from home, I would certainly not feel comfortable proceeding with surgery in his current condition. He would probably benefit from repeat BNP as prior was on 07/12/21. )     Anesthesia Quick Evaluation

## 2021-07-20 NOTE — Progress Notes (Addendum)
Progress Note  Patient Name: DORRANCE DEITCH Date of Encounter: 07/20/2021  [facility] Cardiologist: Jenean Lindau, MD   Subjective   Underwent ERCP with stone extraction yesterday.  Became hypotensive with anesthesia, required pressors.  Also with desaturations following the case.  This morning having dyspnea.  BP 116/83.  Inpatient Medications    Scheduled Meds:  [MAR Hold] furosemide  40 mg Oral Daily   [MAR Hold] potassium chloride SA  20 mEq Oral Daily   [MAR Hold] rosuvastatin  40 mg Oral QHS   [MAR Hold] spironolactone  25 mg Oral QHS   Continuous Infusions:  lactated ringers 10 mL/hr at 07/20/21 0941   [MAR Hold] piperacillin-tazobactam (ZOSYN)  IV 3.375 g (07/20/21 0823)   PRN Meds: [id number] Hold] acetaminophen **OR** [MAR Hold] acetaminophen, [MAR Hold] alum & mag hydroxide-simeth, [MAR Hold]  HYDROmorphone (DILAUDID) injection, [MAR Hold] insulin aspart   Vital Signs    Vitals:   07/20/21 0343 07/20/21 0924 07/20/21 0939 07/20/21 0946  BP: 113/75 116/83    Pulse: 68 69    Resp: 20 17    Temp: 97.8 F (36.6 C) (!) 97.5 F (36.4 C)    TempSrc: Axillary Axillary    SpO2: 99% 97%  97%  Weight:   102.7 kg   Height:   '6\' 1"'$  (1.854 m)     Intake/Output Summary (Last 24 hours) at 07/20/2021 1043 Last data filed at 07/20/2021 0600 Gross per 24 hour  Intake 1401.64 ml  Output 1203 ml  Net 198.64 ml   Last 3 Weights 07/20/2021 07/20/2021 07/19/2021  Weight (lbs) 226 lb 6.6 oz 226 lb 6.6 oz 226 lb 6.6 oz  Weight (kg) 102.7 kg 102.7 kg 102.7 kg      Telemetry    Afib yesterday evening to 120s, now in NSR with first degree AV block, rate 60s. - Personally Reviewed  ECG    Afib with RVR, rate 113, RBBB - Personally Reviewed  Physical Exam   GEN: No acute distress.   Neck: +JVD Cardiac: RRR, no murmurs, rubs, or gallops.  Respiratory: Bibasilar crackles GI: Soft, nontender, non-distended  MS: No edema; Cool extremities Neuro:  Nonfocal  Psych:  Normal affect   Labs    High Sensitivity Troponin:   Recent Labs  Lab 07/12/21 2118 07/12/21 2352  TROPONINIHS 215* 186*      Chemistry Recent Labs  Lab 07/18/21 0210 07/19/21 0513 07/20/21 0547  NA 133* 135 135  K 4.0 4.2 4.6  CL 96* 101 99  CO2 '24 25 27  '$ GLUCOSE 98 121* 176*  BUN 43* 40* 47*  CREATININE 2.01* 2.12* 2.47*  CALCIUM 9.3 9.3 9.5  PROT 6.4* 6.4* 6.4*  ALBUMIN 3.1* 2.8* 2.7*  AST 49* 53* 57*  ALT '21 24 24  '$ ALKPHOS 169* 210* 277*  BILITOT 3.7* 3.4* 3.2*  GFRNONAA 35* 33* 28*  ANIONGAP '13 9 9     '$ Hematology Recent Labs  Lab 07/18/21 0210 07/19/21 0513 07/20/21 0547  WBC 14.2* 12.3* 8.8  RBC 6.18* 6.33* 6.12*  HGB 16.2 16.1 15.9  HCT 49.0 50.7 49.5  MCV 79.3* 80.1 80.9  MCH 26.2 25.4* 26.0  MCHC 33.1 31.8 32.1  RDW 21.0* 21.6* 21.6*  PLT 82* 86* 106*    BNPNo results for input(s): BNP, PROBNP in the last 168 hours.   DDimer No results for input(s): DDIMER in the last 168 hours.   Radiology    DG Chest Port 1 View  Result Date: 07/19/2021  CLINICAL DATA:  Respiratory distress. EXAM: PORTABLE CHEST 1 VIEW COMPARISON:  July 18, 2021 FINDINGS: Mild, stable areas of linear scarring and/or atelectasis are seen within the bilateral lung bases. There is no evidence of a pleural effusion or pneumothorax. The cardiac silhouette is enlarged and unchanged in size. The visualized skeletal structures are unremarkable. IMPRESSION: Mild, stable bibasilar linear scarring and/or atelectasis. Electronically Signed   By: Virgina Norfolk M.D.   On: 07/19/2021 17:07   DG CHEST PORT 1 VIEW  Result Date: 07/18/2021 CLINICAL DATA:  Weakness EXAM: PORTABLE CHEST 1 VIEW COMPARISON:  07/12/2021 FINDINGS: Cardiac shadow is enlarged but stable. Increasing right basilar atelectasis is noted. Left lung remains clear. No bony abnormality is noted. IMPRESSION: Increasing right basilar atelectasis. Electronically Signed   By: Inez Catalina M.D.   On: 07/18/2021 14:36   DG  ERCP  Result Date: 07/19/2021 CLINICAL DATA:  Gallstone pancreatitis EXAM: ERCP TECHNIQUE: Multiple spot images obtained with the fluoroscopic device and submitted for interpretation post-procedure. FLUOROSCOPY TIME:  Fluoroscopy Time:  5 minutes 52 seconds Number of Acquired Spot Images: 0 COMPARISON:  CT abdomen/pelvis 07/17/2021 FINDINGS: A total of 22 intraoperative spot images are submitted for review. The images demonstrate a flexible duodenal scope in the descending duodenum with wire cannulation of the main pancreatic duct and common bile duct. Subsequent images demonstrate cholangiography, sphincterotomy and balloon sweeping of the common duct. IMPRESSION: ERCP with sphincterotomy and balloon sweeping of the common duct. These images were submitted for radiologic interpretation only. Please see the procedural report for the amount of contrast and the fluoroscopy time utilized. Electronically Signed   By: Jacqulynn Cadet M.D.   On: 07/19/2021 15:11   ECHOCARDIOGRAM LIMITED  Result Date: 07/19/2021    ECHOCARDIOGRAM LIMITED REPORT   Patient Name:   BONIFACE ROTUNNO Date of Exam: 07/19/2021 Medical Rec #:  [id number]          Height:       73.0 in Accession #:    [id number]         Weight:       219.1 lb Date of Birth:  1951-10-13          BSA:          2.237 m Patient Age:    57 years           BP:           114/85 mmHg Patient Gender: M                  HR:           92 bpm. Exam Location:  Inpatient Procedure: Limited Echo, Limited Color Doppler and Cardiac Doppler Indications:    R94.31 Abnormal EKG  History:        Patient has prior history of Echocardiogram examinations, most                 recent 05/06/2021. CHF and Idiopathic CMP, Previous Myocardial                 Infarction and CAD, Arrythmias:Atrial Fibrillation; Risk                 Factors:Diabetes, Hypertension, Former Smoker and Dyslipidemia.  Sonographer:    Bernadene Person RDCS Referring Phys: [place]  1. 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 Left ventricular ejection fraction, by estimation, is 20 to  25%. The left ventricle has severely decreased function. The left ventricle demonstrates global hypokinesis. There is moderate concentric left ventricular hypertrophy. Left ventricular diastolic function could not be evaluated. There is the interventricular septum is flattened in systole and diastole, consistent with right ventricular pressure and volume overload. There is global hypokinesis with disporportionately severe inferior and inferolateral hypokinesis, consistent with scar in the right coronary artery territory.  2. Right ventricular systolic function is severely reduced. Tricuspid regurgitation signal is inadequate for assessing PA pressure.  3. Left atrial size was mild to moderately dilated.  4. Right atrial size was severely dilated.  5. The pericardial effusion is localized near the right atrium.  6. The mitral valve is normal in structure. No evidence of mitral valve regurgitation. No evidence of mitral stenosis.  7. The aortic valve is normal in structure. There is mild thickening of the aortic valve. Aortic valve regurgitation is not visualized. Mild aortic valve sclerosis is present, with no evidence of aortic valve stenosis.  8. The inferior vena cava is dilated in size with <50% respiratory variability, suggesting right atrial pressure of 15 mmHg. Comparison(s): The rhythm is now atrial fibrillation. Otherwise, No significant change from prior study. Prior images reviewed side by side. 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 There is evidence of severely reduced cardiac output. 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 Left ventricular ejection fraction, by estimation, is 20 to 25%. The  left ventricle has severely decreased function. The left ventricle demonstrates global hypokinesis. There is moderate concentric left ventricular hypertrophy. The interventricular septum is flattened in systole and diastole, consistent with right ventricular pressure and volume overload. Left ventricular diastolic function could not be evaluated. Left ventricular diastolic function could not be evaluated due to atrial fibrillation.  LV Wall Scoring: The inferior wall, mid inferoseptal segment, and basal inferoseptal segment are akinetic. The entire anterior wall, entire lateral wall, entire anterior septum, and entire apex are hypokinetic. There is global hypokinesis with disporportionately severe inferior and inferolateral hypokinesis, consistent with scar in the right coronary artery territory. Right Ventricle: Right ventricular systolic function is severely reduced. Tricuspid regurgitation signal is inadequate for assessing PA pressure. Left Atrium: Left atrial size was mild to moderately dilated. Right Atrium: Right atrial size was severely dilated. Pericardium: Trivial pericardial effusion is present. The pericardial effusion is localized near the right atrium. Mitral Valve: The mitral valve is normal in structure. No evidence of mitral valve stenosis. Tricuspid Valve: Tricuspid valve regurgitation is mild. Aortic Valve: The aortic valve is normal in structure. There is mild thickening of the aortic valve. Aortic valve regurgitation is not visualized. Mild aortic valve sclerosis is present, with no evidence of aortic valve stenosis. Pulmonic Valve: The pulmonic valve was grossly normal. Pulmonic valve regurgitation is not visualized. Venous: The inferior vena cava is dilated in size with less than 50% respiratory  variability, suggesting right atrial pressure of 15 mmHg. Additional Comments: There is pleural effusion in the left lateral region. Moderate ascites is present. LEFT VENTRICLE PLAX 2D LVIDd:         5.00 cm LVIDs:         4.40 cm LV PW:         1.60 cm LV IVS:        1.70 cm LVOT diam:     2.10  cm LV SV:         27 LV SV Index:   12 LVOT Area:     3.46 cm  LV Volumes (MOD) LV vol d, MOD A2C: 151.0 ml LV vol d, MOD A4C: 152.0 ml LV vol s, MOD A2C: 119.0 ml LV vol s, MOD A4C: 105.0 ml LV SV MOD A2C:     32.0 ml LV SV MOD A4C:     47.0 ml LV SV MOD BP:      41.3 ml RIGHT VENTRICLE TAPSE (M-mode): 1.0 cm LEFT ATRIUM           Index       RIGHT ATRIUM           Index LA diam:      4.30 cm 1.92 cm/m  RA Area:     30.76 cm 13.75 cm/m LA Vol (A4C): 57.8 ml 25.82 ml/m RA Volume:   120.58 ml 53.91 ml/m  AORTIC VALVE LVOT Vmax:   61.17 cm/s LVOT Vmean:  35.800 cm/s LVOT VTI:    0.078 m  AORTA Ao Root diam: 3.70 cm  SHUNTS Systemic VTI:  0.08 m Systemic Diam: 2.10 cm Sanda Klein MD Electronically signed by Sanda Klein MD Signature Date/Time: 07/19/2021/1:09:52 PM    Final     Cardiac Studies   Echo 07/19/21:  1. Marked LV thickening and hyperechogenic myocardium strongly suggestive  of infiltrative cardiomyopathy (e.g. amyloidosis). There is superimposed  ischemic cardiomyopathy. Left ventricular ejection fraction, by  estimation, is 20 to 25%. The left  ventricle has severely decreased function. The left ventricle demonstrates  global hypokinesis. There is moderate concentric left ventricular  hypertrophy. Left ventricular diastolic function could not be evaluated.  There is the interventricular septum is  flattened in systole and diastole, consistent with right ventricular  pressure and volume overload. There is global hypokinesis with  disporportionately severe inferior and inferolateral hypokinesis,  consistent with scar in the right coronary artery  territory.   2. Right ventricular systolic  function is severely reduced. Tricuspid  regurgitation signal is inadequate for assessing PA pressure.   3. Left atrial size was mild to moderately dilated.   4. Right atrial size was severely dilated.   5. The pericardial effusion is localized near the right atrium.   6. The mitral valve is normal in structure. No evidence of mitral valve  regurgitation. No evidence of mitral stenosis.   7. The aortic valve is normal in structure. There is mild thickening of  the aortic valve. Aortic valve regurgitation is not visualized. Mild  aortic valve sclerosis is present, with no evidence of aortic valve  stenosis.   8. The inferior vena cava is dilated in size with <50% respiratory  variability, suggesting right atrial pressure of 15 mmHg.   Patient Profile     70 y.o. male with a hx of CAD, NSTEMI 2009 with PCI of RCA with overlapping DES stents, HTN ,HLD, DM and chronic systolic CHF  who is being seen 07/18/2021 for the evaluation of CHF and pre-op eval for cholecystotomy  at the request of Dr. Starla Link.    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  His RCRI score is 5 (CHF, CAD, CKD, IDDM, intraperitoneal surgery), which corresponds to 15% 30-day risk of death, MI, or cardiac arrest.  He recently underwent cardiac catheterization with moderate disease but no targets for intervention.  No further ischemic workup recommended at this time. Would classify as high risk for an intermediate risk surgery.  Discussed with Dr Dema Severin and Dr Darrick Meigs.  He is a high risk surgical candidate, and considering he had hypotension and hypoxia with sedation yesterday for ERCP, I am concerned about his ability to tolerate surgery.  Per Dr. Dema Severin, benefit of cholecystectomy would be in preventing future episodes of choledocholithiasis but watchful waiting is another approach.  In discussion with Dr.  Dema Severin and Dr. Darrick Meigs, plan to hold off on surgery for now.  Hopefully with treatment of his heart failure can see improvement in systolic function, and if he has recurrence of choledocholithiasis, may be in a better position to undergo surgery at that time.  Acute on chronic combined systolic and diastolic heart failure: EF 20-25%, severe RV dysfunction, findings concerning for amyloid. -PYP scan planned as outpatient to evaluate for amyloid.  Will check SPEP/UPEP/light chains -Cr 2.5, will hold spironolactone  -Hold Farxiga in setting of AKI -No ACE/ARB/Arni in setting of AKI -Appears volume overloaded, likely due to IV fluids received yesterday.  IV fluids stopped and will start IV lasix 40 mg BID -Normotensive, but appears cool on exam, will check lactate  Paroxysmal atrial fibrillation: Intermittent A. fib up to rates 120s.  On Eliquis at home, has been held due to surgery. -Can restart Eliquis if no other procedures planned  -Not currently on beta blocker, will start if having more Afib but hold for now in setting of decompensated heart failure as above  CAD: s/p NSTEMI in 2008 with stents to RCA and LAD.  Cath on 05/05/2021 showed moderate CAD but no significant obstruction requiring intervention (25% left main disease, 5% proximal to mid RCA, 25% proximal RCA, 70% distal RCA, 20% ostial LCx, 30% proximal to mid LCx, 20% proximal to mid LAD, 30% D1, 50% mid LAD).   -Start ASA given stenting history while Eliquis is held.  Continue statin  AKI on CKD: baseline Cr appears 1.5-1.9, currently 2.5.  Suspect cardiorenal, will monitor with diuresis.   For questions or updates, please contact [place] Please consult [email or web address] for contact info under        Signed, Donato Heinz, MD  07/20/2021, 10:43 AM

## 2021-07-20 NOTE — Progress Notes (Signed)
Floor nurse, Jarrett Soho, notified that patients surgery was canceled and the patient would be returning to his room,68, shortly once Dr.White talked to him.  Jarrett Soho voiced understanding.

## 2021-07-20 NOTE — Anesthesia Postprocedure Evaluation (Signed)
Anesthesia Post Note  Patient: [name] [name]  Procedure(s) Performed: ENDOSCOPIC RETROGRADE CHOLANGIOPANCREATOGRAPHY (ERCP) SPHINCTEROTOMY REMOVAL OF STONES     Patient location during evaluation: Endoscopy Anesthesia Type: General Level of consciousness: sedated and patient cooperative Pain management: pain level controlled Vital Signs Assessment: post-procedure vital signs reviewed and stable Respiratory status: spontaneous breathing Cardiovascular status: stable Anesthetic complications: no   No notable events documented.  Last Vitals:  Vitals:   07/20/21 0924 07/20/21 0946  BP: 116/83   Pulse: 69   Resp: 17   Temp: (!) 36.4 C   SpO2: 97% 97%    Last Pain:  Vitals:   07/20/21 0938  TempSrc:   PainSc: 0-No pain                 Nolon Nations

## 2021-07-20 NOTE — Progress Notes (Signed)
I spent time discussing his case with Dr. Gardiner Rhyme this morning. He has had some bumps in his recovery from ERCP and has a significantly underlying heart history - with periprocedural hypotension and then some afib/desaturations overnight. His gallbladder appears to fill on ERCP and this would go against any sort of acute cholecystitis at this juncture. We discussed the rational for surgery being primarily risk reduction from recurrence of choledocholithiasis/biliary pancreatitis. Generally this is not a life threatening problem and at this current juncture surgery very well may be. We therefore discussed potential to hold off on surgery, allowing time for further cardiac workup including amyloid and medical optimization if this is the case. We will plan to remain available to assist in his care as needed. He may follow-up in our office with [facility] as well.  Nadeen Landau, MD [facility] Use [email or web address] to contact on call provider

## 2021-07-20 NOTE — Progress Notes (Signed)
Alerted by charge nurse that patient son requesting to see leadership regarding care concern.  [name] [name]-son upset that he was not alerted that the patient was scheduled for surgery this morning and not one had called him.  Son learned of surgery from family medical practice alert. Son concerned that patient was going to surgery and he did not know and father is confused. Son instructed no procedures in the future without talking with son first.  Son requesting to speak with cardiology and general surgery, Dr Gardiner Rhyme and Dr. Dema Severin paged via Shea Evans.  No further concerns at this time.

## 2021-07-21 ENCOUNTER — Encounter (HOSPITAL_COMMUNITY): Payer: Self-pay | Admitting: Gastroenterology

## 2021-07-21 DIAGNOSIS — I5043 Acute on chronic combined systolic (congestive) and diastolic (congestive) heart failure: Secondary | ICD-10-CM | POA: Diagnosis not present

## 2021-07-21 DIAGNOSIS — I480 Paroxysmal atrial fibrillation: Secondary | ICD-10-CM | POA: Diagnosis not present

## 2021-07-21 DIAGNOSIS — N179 Acute kidney failure, unspecified: Secondary | ICD-10-CM | POA: Diagnosis not present

## 2021-07-21 DIAGNOSIS — Z01810 Encounter for preprocedural cardiovascular examination: Secondary | ICD-10-CM | POA: Diagnosis not present

## 2021-07-21 LAB — LACTIC ACID, PLASMA: Lactic Acid, Venous: 1.5 mmol/L (ref 0.5–1.9)

## 2021-07-21 LAB — GLUCOSE, CAPILLARY
Glucose-Capillary: 149 mg/dL — ABNORMAL HIGH (ref 70–99)
Glucose-Capillary: 120 mg/dL — ABNORMAL HIGH (ref 70–99)
Glucose-Capillary: 123 mg/dL — ABNORMAL HIGH (ref 70–99)
Glucose-Capillary: 189 mg/dL — ABNORMAL HIGH (ref 70–99)

## 2021-07-21 LAB — MAGNESIUM: Magnesium: 2.2 mg/dL (ref 1.7–2.4)

## 2021-07-21 LAB — BASIC METABOLIC PANEL
Anion gap: 11 (ref 5–15)
BUN: 53 mg/dL — ABNORMAL HIGH (ref 8–23)
CO2: 28 mmol/L (ref 22–32)
Calcium: 9.6 mg/dL (ref 8.9–10.3)
Chloride: 100 mmol/L (ref 98–111)
Creatinine, Ser: 2.65 mg/dL — ABNORMAL HIGH (ref 0.61–1.24)
GFR, Estimated: 25 mL/min — ABNORMAL LOW (ref >60)
Glucose, Bld: 149 mg/dL — ABNORMAL HIGH (ref 70–99)
Potassium: 3.8 mmol/L (ref 3.5–5.1)
Sodium: 139 mmol/L (ref 135–145)

## 2021-07-21 LAB — BRAIN NATRIURETIC PEPTIDE: B Natriuretic Peptide: 880.7 pg/mL — ABNORMAL HIGH (ref 0.0–100.0)

## 2021-07-21 MED ORDER — AMOXICILLIN-POT CLAVULANATE 875-125 MG PO TABS
1.0000 | ORAL_TABLET | Freq: Two times a day (BID) | ORAL | Status: AC
  Administered 2021-07-21 – 2021-07-22 (×4): 1 via ORAL
  Filled 2021-07-21 (×3): qty 1

## 2021-07-21 NOTE — Progress Notes (Signed)
[facility]  PROGRESS NOTE  [name] [name] [id number] DOB: 08-Oct-1951 DOA: 07/17/2021 PCP: Raeanne Gathers, MD   Brief HPI:   70 year old male with history of diabetes mellitus type 2, chronic systolic CHF, CKD stage III, paroxysmal atrial fibrillation, CAD, hypertension, osteoarthritis, patient presented with abdominal pain and diarrhea.  On presentation was found to have acute cholecystitis with choledocholithiasis and mild pancreatitis along with leukocytosis elevated LFTs.  He was started on broad-spectrum antibiotics.  GI and general surgery were consulted.  Patient was supposed to get cholecystectomy however surgery was canceled as cardiology did not recommend patient to undergo surgery due to risk involved with surgery.  Subjective   Breathing much better, excellent diuresis with Lasix.  Net -2.7 L.  Not requiring oxygen at this time.  Renal function is worsening with diuresis.   Assessment/Plan:     Acute calculus cholecystitis/choledocholithiasis/biliary pancreatitis -Patient underwent ERCP which showed choledocholithiasis, underwent stone removal with sphincterotomy -Cholecystectomy was scheduled for this morning however it was canceled after cardiology recommended against it -General surgery has signed off and will follow patient in their office -Patient received 3 days of IV Zosyn, will discontinue IV Zosyn and start Augmentin 1 tablet p.o. twice daily for 2 more days to complete 5 days of treatment.  Acute on chronic combined systolic and diastolic heart failure -Patient has EF of 20 to 25%, severe RV dysfunction, findings concerning for amyloid -Cardiology has ordered PYP scan to evaluate for amyloid, SPEP/UPEP/light chains -Started on IV Lasix for fluid overload, Lasix currently on hold due to worsening renal function  Paroxysmal atrial fibrillation -We will restart Eliquis; will discontinue aspirin as Eliquis has been restarted -Beta-blocker on hold  considering CHF as above  CAD s/p NSTEMI in 2008 with stent to RCA and LAD -Continue statin, aspirin  Diabetes mellitus type 2 -[place] is currently on hold due to acute kidney injury -Continue sliding scale insulin with NovoLog   Acute kidney injury on CKD stage IIIb -Patient baseline creatinine is around 1.8 -Presented with creatinine of 2.02, which has worsened to 2.65 this morning -Likely from CHF exacerbation, also patient became hypotensive yesterday during ERCP requiring pressor support -He was started on scheduled IV Lasix 40 mg IV every 12 hours.  Lasix is currently on hold for worsening renal function.  We will follow BMP in am.  Scheduled medications:    amoxicillin-clavulanate  1 tablet Oral Q12H   apixaban  5 mg Oral BID   rosuvastatin  40 mg Oral QHS         Data Reviewed:   CBG:  Recent Labs  Lab 07/20/21 1150 07/20/21 1816 07/20/21 2336 07/21/21 0515 07/21/21 1139  GLUCAP 163* 200* 174* 149* 120*    SpO2: 97 % O2 Flow Rate (L/min): 2 L/min    Vitals:   07/20/21 0946 07/20/21 2037 07/21/21 0459 07/21/21 1140  BP:  125/90 109/78 100/70  Pulse:  85 66 66  Resp:  '20 17 18  '$ Temp:   (!) 97.5 F (36.4 C) (!) 97.4 F (36.3 C)  TempSrc:    Oral  SpO2: 97% 95% 95% 97%  Weight:   97.7 kg   Height:         Intake/Output Summary (Last 24 hours) at 07/21/2021 1453 Last data filed at 07/21/2021 1100 Gross per 24 hour  Intake 940 ml  Output 3800 ml  Net -2860 ml    08/30 1901 - 09/01 0700 In: 810 [P.O.:710] Out: 4300 [Urine:4300]  Filed Weights   07/20/21  0101 07/20/21 0939 07/21/21 0459  Weight: 102.7 kg 102.7 kg 97.7 kg    CBC:  Recent Labs  Lab 07/17/21 1848 07/18/21 0210 07/19/21 0513 07/20/21 0547  WBC 16.5* 14.2* 12.3* 8.8  HGB 16.8 16.2 16.1 15.9  HCT 53.2* 49.0 50.7 49.5  PLT 88* 82* 86* 106*  MCV 82.2 79.3* 80.1 80.9  MCH 26.0 26.2 25.4* 26.0  MCHC 31.6 33.1 31.8 32.1  RDW 21.2* 21.0* 21.6* 21.6*  LYMPHSABS 0.4*  --   0.5* 0.4*  MONOABS 1.4*  --  1.2* 0.5  EOSABS 0.0  --  0.0 0.0  BASOSABS 0.0  --  0.0 0.0    Complete metabolic panel:  Recent Labs  Lab 07/17/21 1848 07/18/21 0210 07/19/21 0513 07/20/21 0547 07/20/21 1354 07/21/21 0541  NA 133* 133* 135 135  --  139  K 4.2 4.0 4.2 4.6  --  3.8  CL 96* 96* 101 99  --  100  CO2 '25 24 25 27  '$ --  28  GLUCOSE 128* 98 121* 176*  --  149*  BUN 44* 43* 40* 47*  --  53*  CREATININE 2.00* 2.01* 2.12* 2.47*  --  2.65*  CALCIUM 9.4 9.3 9.3 9.5  --  9.6  AST 51* 49* 53* 57*  --   --   ALT '23 21 24 24  '$ --   --   ALKPHOS 171* 169* 210* 277*  --   --   BILITOT 4.1* 3.7* 3.4* 3.2*  --   --   ALBUMIN 3.3* 3.1* 2.8* 2.7*  --   --   MG  --   --  2.2 2.2  --  2.2  LATICACIDVEN  --   --   --   --  2.0* 1.5  HGBA1C  --  6.6*  --   --   --   --   BNP  --   --   --   --   --  880.7*    Recent Labs  Lab 07/17/21 1848 07/19/21 0513 07/20/21 0547  LIPASE 105* 40 63*    Recent Labs  Lab 07/17/21 2001 07/21/21 0541  BNP  --  880.7*  SARSCOV2NAA NEGATIVE  --     ------------------------------------------------------------------------------------------------------------------ No results for input(s): CHOL, HDL, LDLCALC, TRIG, CHOLHDL, LDLDIRECT in the last 72 hours.  Lab Results  Component Value Date   HGBA1C 6.6 (H) 07/18/2021   ------------------------------------------------------------------------------------------------------------------ No results for input(s): TSH, T4TOTAL, T3FREE, THYROIDAB in the last 72 hours.  Invalid input(s): FREET3 ------------------------------------------------------------------------------------------------------------------ No results for input(s): VITAMINB12, FOLATE, FERRITIN, TIBC, IRON, RETICCTPCT in the last 72 hours.  Coagulation profile No results for input(s): INR, PROTIME in the last 168 hours. No results for input(s): DDIMER in the last 72 hours.  Cardiac Enzymes No results for input(s): CKTOTAL,  CKMB, CKMBINDEX, TROPONINI in the last 168 hours.  ------------------------------------------------------------------------------------------------------------------    Component Value Date/Time   BNP 880.7 (H) 07/21/2021 0541     Antibiotics: Anti-infectives (From admission, onward)    Start     Dose/Rate Route Frequency Ordered Stop   07/21/21 1400  amoxicillin-clavulanate (AUGMENTIN) 875-125 MG per tablet 1 tablet        1 tablet Oral Every 12 hours 07/21/21 1057 07/23/21 0959   07/18/21 0800  piperacillin-tazobactam (ZOSYN) IVPB 3.375 g  Status:  Discontinued        3.375 g 12.5 mL/hr over 240 Minutes Intravenous Every 8 hours 07/18/21 0142 07/21/21 1057   07/18/21 0015  piperacillin-tazobactam (ZOSYN)  IVPB 3.375 g        3.375 g 100 mL/hr over 30 Minutes Intravenous  Once 07/18/21 0007 07/18/21 0129        Radiology Reports  DG Chest Port 1 View  Result Date: 07/19/2021 CLINICAL DATA:  Respiratory distress. EXAM: PORTABLE CHEST 1 VIEW COMPARISON:  July 18, 2021 FINDINGS: Mild, stable areas of linear scarring and/or atelectasis are seen within the bilateral lung bases. There is no evidence of a pleural effusion or pneumothorax. The cardiac silhouette is enlarged and unchanged in size. The visualized skeletal structures are unremarkable. IMPRESSION: Mild, stable bibasilar linear scarring and/or atelectasis. Electronically Signed   By: Virgina Norfolk M.D.   On: 07/19/2021 17:07   DG ERCP  Result Date: 07/19/2021 CLINICAL DATA:  Gallstone pancreatitis EXAM: ERCP TECHNIQUE: Multiple spot images obtained with the fluoroscopic device and submitted for interpretation post-procedure. FLUOROSCOPY TIME:  Fluoroscopy Time:  5 minutes 52 seconds Number of Acquired Spot Images: 0 COMPARISON:  CT abdomen/pelvis 07/17/2021 FINDINGS: A total of 22 intraoperative spot images are submitted for review. The images demonstrate a flexible duodenal scope in the descending duodenum with wire  cannulation of the main pancreatic duct and common bile duct. Subsequent images demonstrate cholangiography, sphincterotomy and balloon sweeping of the common duct. IMPRESSION: ERCP with sphincterotomy and balloon sweeping of the common duct. These images were submitted for radiologic interpretation only. Please see the procedural report for the amount of contrast and the fluoroscopy time utilized. Electronically Signed   By: Jacqulynn Cadet M.D.   On: 07/19/2021 15:11      DVT prophylaxis: SCDs  Code Status: Full code  Family Communication: No family at bedside   Consultants: Gastroenterology General surgery  Procedures:     Objective    Physical Examination:  General-appears in no acute distress Heart-S1-S2, regular, no murmur auscultated Lungs-clear to auscultation bilaterally, no wheezing or crackles auscultated Abdomen-soft, nontender, no organomegaly Extremities-no edema in the lower extremities Neuro-alert, oriented x3, no focal deficit noted  Status is: Inpatient  Dispo: The patient is from: Home              Anticipated d/c is to: Home              Anticipated d/c date is: 07/23/2021              Patient currently not stable for discharge  Barrier to discharge-worsening renal function  COVID-19 Labs  No results for input(s): DDIMER, FERRITIN, LDH, CRP in the last 72 hours.  Lab Results  Component Value Date   [place] NEGATIVE 07/17/2021   [place] NEGATIVE 05/04/2021   [place] NEGATIVE 11/30/2020    Microbiology  Recent Results (from the past 240 hour(s))  Resp Panel by RT-PCR (Flu A&B, Covid) Nasopharyngeal Swab     Status: None   Collection Time: 07/17/21  8:01 PM   Specimen: Nasopharyngeal Swab; Nasopharyngeal(NP) swabs in vial transport medium  Result Value Ref Range Status   SARS Coronavirus 2 by RT PCR NEGATIVE NEGATIVE Final    Comment: (NOTE) SARS-CoV-2 target nucleic acids are NOT DETECTED.  The SARS-CoV-2 RNA is generally  detectable in upper respiratory specimens during the acute phase of infection. The lowest concentration of SARS-CoV-2 viral copies this assay can detect is 138 copies/mL. A negative result does not preclude SARS-Cov-2 infection and should not be used as the sole basis for treatment or other patient management decisions. A negative result may occur with  improper specimen collection/handling, submission of specimen other than  nasopharyngeal swab, presence of viral mutation(s) within the areas targeted by this assay, and inadequate number of viral copies(<138 copies/mL). A negative result must be combined with clinical observations, patient history, and epidemiological information. The expected result is Negative.  Fact Sheet for Patients:  EntrepreneurPulse.com.au  Fact Sheet for Healthcare Providers:  IncredibleEmployment.be  This test is no t yet approved or cleared by the [place] FDA and  has been authorized for detection and/or diagnosis of SARS-CoV-2 by FDA under an Emergency Use Authorization (EUA). This EUA will remain  in effect (meaning this test can be used) for the duration of the COVID-19 declaration under Section 564(b)(1) of the Act, 21 U.S.C.section 360bbb-3(b)(1), unless the authorization is terminated  or revoked sooner.       Influenza A by PCR NEGATIVE NEGATIVE Final   Influenza B by PCR NEGATIVE NEGATIVE Final    Comment: (NOTE) The Xpert Xpress SARS-CoV-2/FLU/RSV plus assay is intended as an aid in the diagnosis of influenza from Nasopharyngeal swab specimens and should not be used as a sole basis for treatment. Nasal washings and aspirates are unacceptable for Xpert Xpress SARS-CoV-2/FLU/RSV testing.  Fact Sheet for Patients: EntrepreneurPulse.com.au  Fact Sheet for Healthcare Providers: IncredibleEmployment.be  This test is not yet approved or cleared by the [place] FDA  and has been authorized for detection and/or diagnosis of SARS-CoV-2 by FDA under an Emergency Use Authorization (EUA). This EUA will remain in effect (meaning this test can be used) for the duration of the COVID-19 declaration under Section 564(b)(1) of the Act, 21 U.S.C. section 360bbb-3(b)(1), unless the authorization is terminated or revoked.  Performed at [facility], [place] [address] [place], [place] [address]   Urine Culture     Status: None   Collection Time: 07/17/21 11:58 PM   Specimen: Urine, Clean Catch  Result Value Ref Range Status   Specimen Description   Final    URINE, CLEAN CATCH Performed at [facility], [place] [address], [place], [place] [address]    Special Requests   Final    NONE Performed at [facility], [place] [address] [place], [place] [address]    Culture   Final    NO GROWTH Performed at [facility], [place] [address] [place], [place] [address]    Report Status 07/19/2021 FINAL  Final  Blood culture (routine x 2)     Status: None (Preliminary result)   Collection Time: 07/18/21  2:10 AM   Specimen: BLOOD  Result Value Ref Range Status   Specimen Description   Final    BLOOD LEFT ANTECUBITAL Performed at [place] [address] [place], [place] [address]    Special Requests   Final    BOTTLES DRAWN AEROBIC ONLY Blood Culture adequate volume Performed at [place] [address], [place], [place] [address]    Culture   Final    NO GROWTH 3 DAYS Performed at [facility], [place] [address], [place], [place] [address]    Report Status PENDING  Incomplete  Blood culture (routine x 2)     Status: None (Preliminary result)   Collection Time: 07/18/21  2:10 AM   Specimen: BLOOD  Result Value Ref Range Status   Specimen Description   Final    BLOOD BLOOD RIGHT FOREARM Performed at [place] [address],  [place], [place] [address]    Special Requests   Final  BOTTLES DRAWN AEROBIC ONLY Blood Culture adequate volume Performed at [place] [address], [place], [place] [address]    Culture   Final    NO GROWTH 3 DAYS Performed at [facility], [place] [address] [place], [place] [address]    Report Status PENDING  Incomplete  Surgical pcr screen     Status: None   Collection Time: 07/20/21  9:05 AM   Specimen: Nasal Mucosa; Nasal Swab  Result Value Ref Range Status   MRSA, PCR NEGATIVE NEGATIVE Final   Staphylococcus aureus NEGATIVE NEGATIVE Final    Comment: (NOTE) The Xpert SA Assay (FDA approved for NASAL specimens in patients 23 years of age and older), is one component of a comprehensive surveillance program. It is not intended to diagnose infection nor to guide or monitor treatment. Performed at [facility], [place] [address] [place],  [address]              Oswald Hillock   [facility] If 7PM-7AM, please contact night-coverage at [email or web address], Office  [phone]   07/21/2021, 2:53 PM  LOS: 3 days

## 2021-07-21 NOTE — Progress Notes (Signed)
Patient had a 7 beat asymptomatic run of V tach. On-call provider made aware.

## 2021-07-21 NOTE — Progress Notes (Signed)
Received call from telemetry for a widened QRS complex.  Checked patient, patient is alert, resting in bed.  Denies pain, no CP, no SOB, no complaints at this time.  BG = 120, BP 100/70 (79), HR 66, RR 18.  No distress.  Will keep monitoring.

## 2021-07-21 NOTE — Progress Notes (Signed)
Progress Note  Patient Name: [name] [name] Date of Encounter: 07/21/2021  [facility] Cardiologist: Jenean Lindau, MD   Subjective   -2.6 L yesterday on IV Lasix 40 mg twice daily.  Worsening renal function (2.5 > 2.7).  Lactate has normalized (2.0 > 1.5).  BP 109/78 this morning.  Weight is down 11 pounds.  He reports dyspnea has improved.  Denies any chest pain.  Inpatient Medications    Scheduled Meds:  apixaban  5 mg Oral BID   furosemide  40 mg Intravenous BID   rosuvastatin  40 mg Oral QHS   Continuous Infusions:  piperacillin-tazobactam (ZOSYN)  IV 3.375 g (07/21/21 0821)   PRN Meds: acetaminophen **OR** acetaminophen, alum & mag hydroxide-simeth, HYDROmorphone (DILAUDID) injection, insulin aspart   Vital Signs    Vitals:   07/20/21 0939 07/20/21 0946 07/20/21 2037 07/21/21 0459  BP:   125/90 109/78  Pulse:   85 66  Resp:   20 17  Temp:    (!) 97.5 F (36.4 C)  TempSrc:      SpO2:  97% 95% 95%  Weight: 102.7 kg   97.7 kg  Height: '6\' 1"'$  (1.854 m)       Intake/Output Summary (Last 24 hours) at 07/21/2021 1014 Last data filed at 07/21/2021 0821 Gross per 24 hour  Intake 820 ml  Output 3800 ml  Net -2980 ml    Last 3 Weights 07/21/2021 07/20/2021 07/20/2021  Weight (lbs) 215 lb 6.2 oz 226 lb 6.6 oz 226 lb 6.6 oz  Weight (kg) 97.7 kg 102.7 kg 102.7 kg      Telemetry    NSR with first degree AV block, rate 60s.  Brief episodes of rate controlled A. fib- Personally Reviewed  ECG    Afib with RVR, rate 113, RBBB - Personally Reviewed  Physical Exam   GEN: No acute distress.   Neck: No JVD Cardiac: RRR, no murmurs, rubs, or gallops.  Respiratory: CTAB GI: Soft, nontender, non-distended  MS: No edema; Cool extremities Neuro:  Nonfocal  Psych: Normal affect   Labs    High Sensitivity Troponin:   Recent Labs  Lab 07/12/21 2118 07/12/21 2352  TROPONINIHS 215* 186*       Chemistry Recent Labs  Lab 07/18/21 0210 07/19/21 0513  07/20/21 0547 07/21/21 0541  NA 133* 135 135 139  K 4.0 4.2 4.6 3.8  CL 96* 101 99 100  CO2 '24 25 27 28  '$ GLUCOSE 98 121* 176* 149*  BUN 43* 40* 47* 53*  CREATININE 2.01* 2.12* 2.47* 2.65*  CALCIUM 9.3 9.3 9.5 9.6  PROT 6.4* 6.4* 6.4*  --   ALBUMIN 3.1* 2.8* 2.7*  --   AST 49* 53* 57*  --   ALT '21 24 24  '$ --   ALKPHOS 169* 210* 277*  --   BILITOT 3.7* 3.4* 3.2*  --   GFRNONAA 35* 33* 28* 25*  ANIONGAP '13 9 9 11      '$ Hematology Recent Labs  Lab 07/18/21 0210 07/19/21 0513 07/20/21 0547  WBC 14.2* 12.3* 8.8  RBC 6.18* 6.33* 6.12*  HGB 16.2 16.1 15.9  HCT 49.0 50.7 49.5  MCV 79.3* 80.1 80.9  MCH 26.2 25.4* 26.0  MCHC 33.1 31.8 32.1  RDW 21.0* 21.6* 21.6*  PLT 82* 86* 106*     BNP Recent Labs  Lab 07/21/21 0541  BNP 880.7*      DDimer No results for input(s): DDIMER in the last 168 hours.   Radiology  DG Chest Port 1 View  Result Date: 07/19/2021 CLINICAL DATA:  Respiratory distress. EXAM: PORTABLE CHEST 1 VIEW COMPARISON:  July 18, 2021 FINDINGS: Mild, stable areas of linear scarring and/or atelectasis are seen within the bilateral lung bases. There is no evidence of a pleural effusion or pneumothorax. The cardiac silhouette is enlarged and unchanged in size. The visualized skeletal structures are unremarkable. IMPRESSION: Mild, stable bibasilar linear scarring and/or atelectasis. Electronically Signed   By: Virgina Norfolk M.D.   On: 07/19/2021 17:07   DG ERCP  Result Date: 07/19/2021 CLINICAL DATA:  Gallstone pancreatitis EXAM: ERCP TECHNIQUE: Multiple spot images obtained with the fluoroscopic device and submitted for interpretation post-procedure. FLUOROSCOPY TIME:  Fluoroscopy Time:  5 minutes 52 seconds Number of Acquired Spot Images: 0 COMPARISON:  CT abdomen/pelvis 07/17/2021 FINDINGS: A total of 22 intraoperative spot images are submitted for review. The images demonstrate a flexible duodenal scope in the descending duodenum with wire cannulation of  the main pancreatic duct and common bile duct. Subsequent images demonstrate cholangiography, sphincterotomy and balloon sweeping of the common duct. IMPRESSION: ERCP with sphincterotomy and balloon sweeping of the common duct. These images were submitted for radiologic interpretation only. Please see the procedural report for the amount of contrast and the fluoroscopy time utilized. Electronically Signed   By: Jacqulynn Cadet M.D.   On: 07/19/2021 15:11   ECHOCARDIOGRAM LIMITED  Result Date: 07/19/2021    ECHOCARDIOGRAM LIMITED REPORT   Patient Name:   [name] [name] Date of Exam: 07/19/2021 Medical Rec #:  [id number]          Height:       73.0 in Accession #:    [id number]         Weight:       219.1 lb Date of Birth:  07-28-1951          BSA:          2.237 m Patient Age:    70 years           BP:           114/85 mmHg Patient Gender: M                  HR:           92 bpm. Exam Location:  Inpatient Procedure: Limited Echo, Limited Color Doppler and Cardiac Doppler Indications:    R94.31 Abnormal EKG  History:        Patient has prior history of Echocardiogram examinations, most                 recent 05/06/2021. CHF and Idiopathic CMP, Previous Myocardial                 Infarction and CAD, Arrythmias:Atrial Fibrillation; Risk                 Factors:Diabetes, Hypertension, Former Smoker and Dyslipidemia.  Sonographer:    Bernadene Person RDCS Referring Phys: [place]  1. 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 Left ventricular ejection fraction, by estimation, is 20 to 25%. The left ventricle has severely decreased function. The left ventricle demonstrates global hypokinesis. There is moderate concentric left ventricular hypertrophy. Left ventricular diastolic function could not be evaluated. There is the interventricular septum is flattened in systole and  diastole, consistent with right ventricular pressure and volume overload. There is global hypokinesis with disporportionately severe  inferior and inferolateral hypokinesis, consistent with scar in the right coronary artery territory.  2. Right ventricular systolic function is severely reduced. Tricuspid regurgitation signal is inadequate for assessing PA pressure.  3. Left atrial size was mild to moderately dilated.  4. Right atrial size was severely dilated.  5. The pericardial effusion is localized near the right atrium.  6. The mitral valve is normal in structure. No evidence of mitral valve regurgitation. No evidence of mitral stenosis.  7. The aortic valve is normal in structure. There is mild thickening of the aortic valve. Aortic valve regurgitation is not visualized. Mild aortic valve sclerosis is present, with no evidence of aortic valve stenosis.  8. The inferior vena cava is dilated in size with <50% respiratory variability, suggesting right atrial pressure of 15 mmHg. Comparison(s): The rhythm is now atrial fibrillation. Otherwise, No significant change from prior study. Prior images reviewed side by side. 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 There is evidence of severely reduced cardiac output. 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 Left ventricular ejection fraction, by estimation, is 20 to 25%. The  left ventricle has severely decreased function. The left ventricle demonstrates global hypokinesis. There is moderate concentric left ventricular hypertrophy. The interventricular septum is flattened in systole and diastole, consistent with right ventricular pressure and volume overload. Left ventricular diastolic  function could not be evaluated. Left ventricular diastolic function could not be evaluated due to atrial fibrillation.  LV Wall Scoring: The inferior wall, mid inferoseptal segment, and basal inferoseptal segment are akinetic. The entire anterior wall, entire lateral wall, entire anterior septum, and entire apex are hypokinetic. There is global hypokinesis with disporportionately severe inferior and inferolateral hypokinesis, consistent with scar in the right coronary artery territory. Right Ventricle: Right ventricular systolic function is severely reduced. Tricuspid regurgitation signal is inadequate for assessing PA pressure. Left Atrium: Left atrial size was mild to moderately dilated. Right Atrium: Right atrial size was severely dilated. Pericardium: Trivial pericardial effusion is present. The pericardial effusion is localized near the right atrium. Mitral Valve: The mitral valve is normal in structure. No evidence of mitral valve stenosis. Tricuspid Valve: Tricuspid valve regurgitation is mild. Aortic Valve: The aortic valve is normal in structure. There is mild thickening of the aortic valve. Aortic valve regurgitation is not visualized. Mild aortic valve sclerosis is present, with no evidence of aortic valve stenosis. Pulmonic Valve: The pulmonic valve was grossly normal. Pulmonic valve regurgitation is not visualized. Venous: The inferior vena cava is dilated in size with less than 50% respiratory variability, suggesting right atrial pressure of 15 mmHg. Additional Comments: There is pleural effusion in the left lateral region. Moderate ascites is present. LEFT VENTRICLE PLAX 2D LVIDd:         5.00 cm LVIDs:         4.40 cm LV PW:         1.60 cm LV IVS:        1.70 cm LVOT diam:     2.10 cm LV SV:         27 LV SV Index:   12 LVOT Area:     3.46 cm  LV Volumes (MOD) LV vol d, MOD A2C: 151.0 ml LV vol d, MOD A4C: 152.0 ml LV vol s, MOD A2C: 119.0 ml LV vol s, MOD A4C:  105.0 ml LV SV MOD A2C:     32.0 ml  LV SV MOD A4C:     47.0 ml LV SV MOD BP:      41.3 ml RIGHT VENTRICLE TAPSE (M-mode): 1.0 cm LEFT ATRIUM           Index       RIGHT ATRIUM           Index LA diam:      4.30 cm 1.92 cm/m  RA Area:     30.76 cm 13.75 cm/m LA Vol (A4C): 57.8 ml 25.82 ml/m RA Volume:   120.58 ml 53.91 ml/m  AORTIC VALVE LVOT Vmax:   61.17 cm/s LVOT Vmean:  35.800 cm/s LVOT VTI:    0.078 m  AORTA Ao Root diam: 3.70 cm  SHUNTS Systemic VTI:  0.08 m Systemic Diam: 2.10 cm Sanda Klein MD Electronically signed by Sanda Klein MD Signature Date/Time: 07/19/2021/1:09:52 PM    Final     Cardiac Studies   Echo 07/19/21:  1. Marked LV thickening and hyperechogenic myocardium strongly suggestive  of infiltrative cardiomyopathy (e.g. amyloidosis). There is superimposed  ischemic cardiomyopathy. Left ventricular ejection fraction, by  estimation, is 20 to 25%. The left  ventricle has severely decreased function. The left ventricle demonstrates  global hypokinesis. There is moderate concentric left ventricular  hypertrophy. Left ventricular diastolic function could not be evaluated.  There is the interventricular septum is  flattened in systole and diastole, consistent with right ventricular  pressure and volume overload. There is global hypokinesis with  disporportionately severe inferior and inferolateral hypokinesis,  consistent with scar in the right coronary artery  territory.   2. Right ventricular systolic function is severely reduced. Tricuspid  regurgitation signal is inadequate for assessing PA pressure.   3. Left atrial size was mild to moderately dilated.   4. Right atrial size was severely dilated.   5. The pericardial effusion is localized near the right atrium.   6. The mitral valve is normal in structure. No evidence of mitral valve  regurgitation. No evidence of mitral stenosis.   7. The aortic valve is normal in structure. There is mild thickening of  the aortic valve. Aortic valve regurgitation  is not visualized. Mild  aortic valve sclerosis is present, with no evidence of aortic valve  stenosis.   8. The inferior vena cava is dilated in size with <50% respiratory  variability, suggesting right atrial pressure of 15 mmHg.   Patient Profile     70 y.o. male with a hx of CAD, NSTEMI 2009 with PCI of RCA with overlapping DES stents, HTN ,HLD, DM and chronic systolic CHF  who is being seen 07/18/2021 for the evaluation of CHF and pre-op eval for cholecystotomy  at the request of Dr. Starla Link.    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  His RCRI score is 5 (CHF, CAD, CKD, IDDM, intraperitoneal surgery), which corresponds to 15% 30-day risk of death, MI, or cardiac arrest.  He recently underwent cardiac catheterization with moderate disease but no targets for intervention.  No further ischemic workup recommended at this time. Would classify as high risk for an intermediate risk surgery.  Discussed with Dr Dema Severin and Dr Darrick Meigs.  He is a high risk surgical candidate, and considering he had hypotension and hypoxia with sedation or ERCP, I am concerned about his ability to tolerate surgery.  Per Dr. Dema Severin, benefit of cholecystectomy would be in preventing future episodes of choledocholithiasis but watchful waiting is another approach.  In discussion with Dr. Dema Severin and Dr. Darrick Meigs, plan to hold off on surgery for now.  Hopefully with treatment of his heart failure can see improvement in systolic function, and if he has recurrence of gallstones, may be in a better position to undergo surgery at that time.  Acute on chronic combined systolic and diastolic heart failure: EF 20-25%, severe RV dysfunction, findings concerning for amyloid. -PYP scan planned as outpatient to evaluate for amyloid.  Will check SPEP/UPEP/light chains -Hold spironolactone given AKI -Hold Farxiga in setting  of AKI -No ACE/ARB/Arni in setting of AKI -Suspect decompensated following IV fluids received for ERCP. lactate elevated on 8/31, had good response to IV Lasix and lactate has normalized.  Received IV Lasix 40 mg this morning, will hold evening dose given creatinine trending up (2.5 > 2.7) and plan to start p.o. Lasix tomorrow if stable creatinine  Paroxysmal atrial fibrillation: Intermittent A. fib up to rates 120s.  On Eliquis at home, was held for surgery -Eliquis restarted at 5 mg twice daily -Not currently on beta blocker, will start if having Afib with RVR but hold for now in setting of decompensated heart failure as above  CAD: s/p NSTEMI in 2008 with stents to RCA and LAD.  Cath on 05/05/2021 showed moderate CAD but no significant obstruction requiring intervention (25% left main disease, 5% proximal to mid RCA, 25% proximal RCA, 70% distal RCA, 20% ostial LCx, 30% proximal to mid LCx, 20% proximal to mid LAD, 30% D1, 50% mid LAD).   -Continue Eliquis, statin  AKI on CKD: baseline Cr appears 1.5-1.9, currently 2.7.  Creatinine started trending up after ERCP, could be ATN from hypotensive episode.  We will hold evening dose of diuresis, if creatinine continues to trend up, will hold Lasix altogether as currently appears euvolemic   For questions or updates, please contact [place] Please consult [email or web address] for contact info under        Signed, Donato Heinz, MD  07/21/2021, 10:14 AM

## 2021-07-21 NOTE — Plan of Care (Signed)
  Problem: Education: Goal: Knowledge of General Education information will improve Description: Including pain rating scale, medication(s)/side effects and non-pharmacologic comfort measures Outcome: Progressing   Problem: Health Behavior/Discharge Planning: Goal: Ability to manage health-related needs will improve Outcome: Progressing   Problem: Clinical Measurements: Goal: Ability to maintain clinical measurements within normal limits will improve Outcome: Progressing   Problem: Clinical Measurements: Goal: Will remain free from infection Outcome: Progressing   Problem: Clinical Measurements: Goal: Diagnostic test results will improve Outcome: Progressing   Problem: Clinical Measurements: Goal: Respiratory complications will improve Outcome: Progressing   Problem: Clinical Measurements: Goal: Cardiovascular complication will be avoided Outcome: Progressing   Problem: Pain Managment: Goal: General experience of comfort will improve Outcome: Progressing   Problem: Safety: Goal: Ability to remain free from injury will improve Outcome: Progressing   Problem: Skin Integrity: Goal: Risk for impaired skin integrity will decrease Outcome: Progressing

## 2021-07-21 NOTE — Plan of Care (Signed)
  Problem: Education: Goal: Knowledge of General Education information will improve Description Including pain rating scale, medication(s)/side effects and non-pharmacologic comfort measures Outcome: Progressing   Problem: Health Behavior/Discharge Planning: Goal: Ability to manage health-related needs will improve Outcome: Progressing   Problem: Clinical Measurements: Goal: Will remain free from infection Outcome: Progressing   Problem: Nutrition: Goal: Adequate nutrition will be maintained Outcome: Progressing   Problem: Coping: Goal: Level of anxiety will decrease Outcome: Progressing   

## 2021-07-22 ENCOUNTER — Inpatient Hospital Stay (HOSPITAL_COMMUNITY): Payer: Medicare Other

## 2021-07-22 DIAGNOSIS — Z01810 Encounter for preprocedural cardiovascular examination: Secondary | ICD-10-CM | POA: Diagnosis not present

## 2021-07-22 DIAGNOSIS — I4892 Unspecified atrial flutter: Secondary | ICD-10-CM | POA: Diagnosis not present

## 2021-07-22 DIAGNOSIS — N179 Acute kidney failure, unspecified: Secondary | ICD-10-CM | POA: Diagnosis not present

## 2021-07-22 DIAGNOSIS — I5043 Acute on chronic combined systolic (congestive) and diastolic (congestive) heart failure: Secondary | ICD-10-CM | POA: Diagnosis not present

## 2021-07-22 DIAGNOSIS — I480 Paroxysmal atrial fibrillation: Secondary | ICD-10-CM | POA: Diagnosis not present

## 2021-07-22 LAB — CBC
HCT: 49.5 % (ref 39.0–52.0)
Hemoglobin: 16.2 g/dL (ref 13.0–17.0)
MCH: 25.5 pg — ABNORMAL LOW (ref 26.0–34.0)
MCHC: 32.7 g/dL (ref 30.0–36.0)
MCV: 78 fL — ABNORMAL LOW (ref 80.0–100.0)
Platelets: 130 10*3/uL — ABNORMAL LOW (ref 150–400)
RBC: 6.35 MIL/uL — ABNORMAL HIGH (ref 4.22–5.81)
RDW: 21.3 % — ABNORMAL HIGH (ref 11.5–15.5)
WBC: 9.8 10*3/uL (ref 4.0–10.5)
nRBC: 0 % (ref 0.0–0.2)

## 2021-07-22 LAB — KAPPA/LAMBDA LIGHT CHAINS
Kappa free light chain: 47.3 mg/L — ABNORMAL HIGH (ref 3.3–19.4)
Kappa, lambda light chain ratio: 1.13 (ref 0.26–1.65)
Lambda free light chains: 41.7 mg/L — ABNORMAL HIGH (ref 5.7–26.3)

## 2021-07-22 LAB — BASIC METABOLIC PANEL
Anion gap: 14 (ref 5–15)
BUN: 69 mg/dL — ABNORMAL HIGH (ref 8–23)
CO2: 26 mmol/L (ref 22–32)
Calcium: 9.3 mg/dL (ref 8.9–10.3)
Chloride: 98 mmol/L (ref 98–111)
Creatinine, Ser: 2.3 mg/dL — ABNORMAL HIGH (ref 0.61–1.24)
GFR, Estimated: 30 mL/min — ABNORMAL LOW (ref >60)
Glucose, Bld: 156 mg/dL — ABNORMAL HIGH (ref 70–99)
Potassium: 3.6 mmol/L (ref 3.5–5.1)
Sodium: 138 mmol/L (ref 135–145)

## 2021-07-22 LAB — GLUCOSE, CAPILLARY
Glucose-Capillary: 158 mg/dL — ABNORMAL HIGH (ref 70–99)
Glucose-Capillary: 173 mg/dL — ABNORMAL HIGH (ref 70–99)
Glucose-Capillary: 176 mg/dL — ABNORMAL HIGH (ref 70–99)
Glucose-Capillary: 180 mg/dL — ABNORMAL HIGH (ref 70–99)

## 2021-07-22 LAB — MAGNESIUM: Magnesium: 1.9 mg/dL (ref 1.7–2.4)

## 2021-07-22 MED ORDER — ONDANSETRON HCL 4 MG/2ML IJ SOLN
4.0000 mg | Freq: Four times a day (QID) | INTRAMUSCULAR | Status: DC | PRN
  Administered 2021-07-22: 4 mg via INTRAVENOUS
  Filled 2021-07-22: qty 2

## 2021-07-22 MED ORDER — POTASSIUM CHLORIDE CRYS ER 10 MEQ PO TBCR
40.0000 meq | EXTENDED_RELEASE_TABLET | Freq: Once | ORAL | Status: AC
  Administered 2021-07-22: 40 meq via ORAL
  Filled 2021-07-22: qty 4

## 2021-07-22 MED ORDER — ALUM & MAG HYDROXIDE-SIMETH 200-200-20 MG/5ML PO SUSP
30.0000 mL | Freq: Once | ORAL | Status: AC
  Administered 2021-07-22: 30 mL via ORAL
  Filled 2021-07-22: qty 30

## 2021-07-22 MED ORDER — FAMOTIDINE IN NACL 20-0.9 MG/50ML-% IV SOLN
20.0000 mg | Freq: Every day | INTRAVENOUS | Status: DC
  Administered 2021-07-22 – 2021-07-23 (×2): 20 mg via INTRAVENOUS
  Filled 2021-07-22 (×3): qty 50

## 2021-07-22 MED ORDER — FUROSEMIDE 40 MG PO TABS
40.0000 mg | ORAL_TABLET | Freq: Every day | ORAL | Status: DC
  Administered 2021-07-22: 40 mg via ORAL
  Filled 2021-07-22: qty 1

## 2021-07-22 MED ORDER — LOPERAMIDE HCL 2 MG PO CAPS
2.0000 mg | ORAL_CAPSULE | Freq: Once | ORAL | Status: AC
  Administered 2021-07-22: 2 mg via ORAL
  Filled 2021-07-22: qty 1

## 2021-07-22 NOTE — Plan of Care (Signed)

## 2021-07-22 NOTE — Progress Notes (Signed)
At 0531 pt had a five beat run of widening QRS complex. Pt slept through the event without symptoms of chest pain or discomfort.

## 2021-07-22 NOTE — Progress Notes (Signed)
Abrasion of.  Lipid repeat evaluation ReviewedTriad Hospitalist  PROGRESS NOTE  [name] [name] [id number] DOB: 09-25-1951 DOA: 07/17/2021 PCP: Raeanne Gathers, MD   Brief HPI:   70 year old male with history of diabetes mellitus type 2, chronic systolic CHF, CKD stage III, paroxysmal atrial fibrillation, CAD, hypertension, osteoarthritis, patient presented with abdominal pain and diarrhea.  On presentation was found to have acute cholecystitis with choledocholithiasis and mild pancreatitis along with leukocytosis elevated LFTs.  He was started on broad-spectrum antibiotics.  GI and general surgery were consulted.  Patient was supposed to get cholecystectomy however surgery was canceled as cardiology did not recommend patient to undergo surgery due to risk involved with surgery.  Subjective   Denies shortness of breath, complains of diarrhea this morning.  Denies abdominal pain or fever.  WBC is normal.  Patient has been getting antibiotics for acute cholecystitis.  Zosyn was changed to Augmentin yesterday.   Assessment/Plan:     Acute calculus cholecystitis/choledocholithiasis/biliary pancreatitis -Patient underwent ERCP which showed choledocholithiasis, underwent stone removal with sphincterotomy -Cholecystectomy was scheduled for this morning however it was canceled after cardiology recommended against it -General surgery has signed off and will follow patient in their office -Patient received 3 days of IV Zosyn, IV Zosyn was discontinued and patient started on Augmentin 1 tablet p.o. twice daily for 2 more days.  Antibiotic treatment will be complete on 07/22/2021.  Acute on chronic combined systolic and diastolic heart failure -Patient has EF of 20 to 25%, severe RV dysfunction, findings concerning for amyloid -Cardiology has ordered PYP scan to evaluate for amyloid, SPEP/UPEP/light chains -Started on IV Lasix for fluid overload, Lasix currently on hold due to worsening renal  function -Cardiology has discontinued IV Lasix and started on p.o. Lasix  Diarrhea -Likely antibiotic induced; he is afebrile.  WBC is normal -Does not have abdominal pain or discomfort -Low likelihood for C. Difficile, will give 1 dose of Imodium 2 mg p.o. x1 -Today's last day of antibiotics.  Paroxysmal atrial fibrillation -Eliquis has been restarted;  will discontinue aspirin as Eliquis has been restarted -Beta-blocker on hold considering CHF as above  CAD s/p NSTEMI in 2008 with stent to RCA and LAD -Continue statin, aspirin  Diabetes mellitus type 2 -[place] is currently on hold due to acute kidney injury -Continue sliding scale insulin with NovoLog   Acute kidney injury on CKD stage IIIb -Patient baseline creatinine is around 1.8 -Presented with creatinine of 2.02, which has worsened to 2.65 yesterday.  Creatinine has improved to 2.30 after holding Lasix. -Likely from CHF exacerbation, also patient became hypotensive yesterday during ERCP requiring pressor support -He was started on scheduled IV Lasix 40 mg IV every 12 hours.  IV Lasix is currently on hold for worsening renal function.    Scheduled medications:    amoxicillin-clavulanate  1 tablet Oral Q12H   apixaban  5 mg Oral BID   furosemide  40 mg Oral Daily   rosuvastatin  40 mg Oral QHS         Data Reviewed:   CBG:  Recent Labs  Lab 07/21/21 1139 07/21/21 1734 07/21/21 2315 07/22/21 0507 07/22/21 1148  GLUCAP 120* 123* 189* 180* 173*    SpO2: 96 % O2 Flow Rate (L/min): 2 L/min    Vitals:   07/21/21 2011 07/22/21 0500 07/22/21 0505 07/22/21 1325  BP: 114/71  107/75 129/90  Pulse: 64  64 68  Resp: '20  20 18  '$ Temp:   97.6 F (36.4 C) 97.8 F (36.6  C)  TempSrc:   Oral   SpO2: 99%  91% 96%  Weight:  93.1 kg    Height:         Intake/Output Summary (Last 24 hours) at 07/22/2021 1331 Last data filed at 07/22/2021 0947 Gross per 24 hour  Intake 160 ml  Output 3525 ml  Net -3365 ml     08/31 1901 - 09/02 0700 In: 805 [P.O.:755] Out: [id number] [Urine:6575]  Filed Weights   07/20/21 0939 07/21/21 0459 07/22/21 0500  Weight: 102.7 kg 97.7 kg 93.1 kg    CBC:  Recent Labs  Lab 07/17/21 1848 07/18/21 0210 07/19/21 0513 07/20/21 0547 07/22/21 0538  WBC 16.5* 14.2* 12.3* 8.8 9.8  HGB 16.8 16.2 16.1 15.9 16.2  HCT 53.2* 49.0 50.7 49.5 49.5  PLT 88* 82* 86* 106* 130*  MCV 82.2 79.3* 80.1 80.9 78.0*  MCH 26.0 26.2 25.4* 26.0 25.5*  MCHC 31.6 33.1 31.8 32.1 32.7  RDW 21.2* 21.0* 21.6* 21.6* 21.3*  LYMPHSABS 0.4*  --  0.5* 0.4*  --   MONOABS 1.4*  --  1.2* 0.5  --   EOSABS 0.0  --  0.0 0.0  --   BASOSABS 0.0  --  0.0 0.0  --     Complete metabolic panel:  Recent Labs  Lab 07/17/21 1848 07/18/21 0210 07/19/21 0513 07/20/21 0547 07/20/21 1354 07/21/21 0541 07/22/21 0538  NA 133* 133* 135 135  --  139 138  K 4.2 4.0 4.2 4.6  --  3.8 3.6  CL 96* 96* 101 99  --  100 98  CO2 '25 24 25 27  '$ --  28 26  GLUCOSE 128* 98 121* 176*  --  149* 156*  BUN 44* 43* 40* 47*  --  53* 69*  CREATININE 2.00* 2.01* 2.12* 2.47*  --  2.65* 2.30*  CALCIUM 9.4 9.3 9.3 9.5  --  9.6 9.3  AST 51* 49* 53* 57*  --   --   --   ALT '23 21 24 24  '$ --   --   --   ALKPHOS 171* 169* 210* 277*  --   --   --   BILITOT 4.1* 3.7* 3.4* 3.2*  --   --   --   ALBUMIN 3.3* 3.1* 2.8* 2.7*  --   --   --   MG  --   --  2.2 2.2  --  2.2 1.9  LATICACIDVEN  --   --   --   --  2.0* 1.5  --   HGBA1C  --  6.6*  --   --   --   --   --   BNP  --   --   --   --   --  880.7*  --     Recent Labs  Lab 07/17/21 1848 07/19/21 0513 07/20/21 0547  LIPASE 105* 40 63*    Recent Labs  Lab 07/17/21 2001 07/21/21 0541  BNP  --  880.7*  SARSCOV2NAA NEGATIVE  --     ------------------------------------------------------------------------------------------------------------------ No results for input(s): CHOL, HDL, LDLCALC, TRIG, CHOLHDL, LDLDIRECT in the last 72 hours.  Lab Results  Component Value Date    HGBA1C 6.6 (H) 07/18/2021   ------------------------------------------------------------------------------------------------------------------ No results for input(s): TSH, T4TOTAL, T3FREE, THYROIDAB in the last 72 hours.  Invalid input(s): FREET3 ------------------------------------------------------------------------------------------------------------------ No results for input(s): VITAMINB12, FOLATE, FERRITIN, TIBC, IRON, RETICCTPCT in the last 72 hours.  Coagulation profile No results for input(s): INR, PROTIME in the last 168  hours. No results for input(s): DDIMER in the last 72 hours.  Cardiac Enzymes No results for input(s): CKTOTAL, CKMB, CKMBINDEX, TROPONINI in the last 168 hours.  ------------------------------------------------------------------------------------------------------------------    Component Value Date/Time   BNP 880.7 (H) 07/21/2021 0541     Antibiotics: Anti-infectives (From admission, onward)    Start     Dose/Rate Route Frequency Ordered Stop   07/21/21 1400  amoxicillin-clavulanate (AUGMENTIN) 875-125 MG per tablet 1 tablet        1 tablet Oral Every 12 hours 07/21/21 1057 07/23/21 0959   07/18/21 0800  piperacillin-tazobactam (ZOSYN) IVPB 3.375 g  Status:  Discontinued        3.375 g 12.5 mL/hr over 240 Minutes Intravenous Every 8 hours 07/18/21 0142 07/21/21 1057   07/18/21 0015  piperacillin-tazobactam (ZOSYN) IVPB 3.375 g        3.375 g 100 mL/hr over 30 Minutes Intravenous  Once 07/18/21 0007 07/18/21 0129        Radiology Reports  No results found.    DVT prophylaxis: SCDs  Code Status: Full code  Family Communication: No family at bedside   Consultants: Gastroenterology General surgery  Procedures:     Objective    Physical Examination:  General-appears in no acute distress Heart-S1-S2, regular, no murmur auscultated Lungs-clear to auscultation bilaterally, no wheezing or crackles auscultated Abdomen-soft,  nontender, no organomegaly Extremities-no edema in the lower extremities Neuro-alert, oriented x3, no focal deficit noted  Status is: Inpatient  Dispo: The patient is from: Home              Anticipated d/c is to: Home              Anticipated d/c date is: 07/23/2021              Patient currently not stable for discharge  Barrier to discharge-worsening renal function  COVID-19 Labs  No results for input(s): DDIMER, FERRITIN, LDH, CRP in the last 72 hours.  Lab Results  Component Value Date   [place] NEGATIVE 07/17/2021   [place] NEGATIVE 05/04/2021   [place] NEGATIVE 11/30/2020    Microbiology  Recent Results (from the past 240 hour(s))  Resp Panel by RT-PCR (Flu A&B, Covid) Nasopharyngeal Swab     Status: None   Collection Time: 07/17/21  8:01 PM   Specimen: Nasopharyngeal Swab; Nasopharyngeal(NP) swabs in vial transport medium  Result Value Ref Range Status   SARS Coronavirus 2 by RT PCR NEGATIVE NEGATIVE Final    Comment: (NOTE) SARS-CoV-2 target nucleic acids are NOT DETECTED.  The SARS-CoV-2 RNA is generally detectable in upper respiratory specimens during the acute phase of infection. The lowest concentration of SARS-CoV-2 viral copies this assay can detect is 138 copies/mL. A negative result does not preclude SARS-Cov-2 infection and should not be used as the sole basis for treatment or other patient management decisions. A negative result may occur with  improper specimen collection/handling, submission of specimen other than nasopharyngeal swab, presence of viral mutation(s) within the areas targeted by this assay, and inadequate number of viral copies(<138 copies/mL). A negative result must be combined with clinical observations, patient history, and epidemiological information. The expected result is Negative.  Fact Sheet for Patients:  EntrepreneurPulse.com.au  Fact Sheet for Healthcare Providers:   IncredibleEmployment.be  This test is no t yet approved or cleared by the [place] FDA and  has been authorized for detection and/or diagnosis of SARS-CoV-2 by FDA under an Emergency Use Authorization (EUA). This EUA will remain  in  effect (meaning this test can be used) for the duration of the COVID-19 declaration under Section 564(b)(1) of the Act, 21 U.S.C.section 360bbb-3(b)(1), unless the authorization is terminated  or revoked sooner.       Influenza A by PCR NEGATIVE NEGATIVE Final   Influenza B by PCR NEGATIVE NEGATIVE Final    Comment: (NOTE) The Xpert Xpress SARS-CoV-2/FLU/RSV plus assay is intended as an aid in the diagnosis of influenza from Nasopharyngeal swab specimens and should not be used as a sole basis for treatment. Nasal washings and aspirates are unacceptable for Xpert Xpress SARS-CoV-2/FLU/RSV testing.  Fact Sheet for Patients: EntrepreneurPulse.com.au  Fact Sheet for Healthcare Providers: IncredibleEmployment.be  This test is not yet approved or cleared by the [place] FDA and has been authorized for detection and/or diagnosis of SARS-CoV-2 by FDA under an Emergency Use Authorization (EUA). This EUA will remain in effect (meaning this test can be used) for the duration of the COVID-19 declaration under Section 564(b)(1) of the Act, 21 U.S.C. section 360bbb-3(b)(1), unless the authorization is terminated or revoked.  Performed at [facility], [place] [address], [place], [place] [address]   Urine Culture     Status: None   Collection Time: 07/17/21 11:58 PM   Specimen: Urine, Clean Catch  Result Value Ref Range Status   Specimen Description   Final    URINE, CLEAN CATCH Performed at [facility], [place] [address], [place], [place] [address]    Special Requests   Final    NONE Performed at [facility], [place] [address]  [place], [place] [address]    Culture   Final    NO GROWTH Performed at [facility], [place] [address], [place], [place] [address]    Report Status 07/19/2021 FINAL  Final  Blood culture (routine x 2)     Status: None (Preliminary result)   Collection Time: 07/18/21  2:10 AM   Specimen: BLOOD  Result Value Ref Range Status   Specimen Description   Final    BLOOD LEFT ANTECUBITAL Performed at [place] [address] [place], [place] [address]    Special Requests   Final    BOTTLES DRAWN AEROBIC ONLY Blood Culture adequate volume Performed at [place] [address] [place], [place] [address]    Culture   Final    NO GROWTH 4 DAYS Performed at [facility], [place] [address] [place], [place] [address]    Report Status PENDING  Incomplete  Blood culture (routine x 2)     Status: None (Preliminary result)   Collection Time: 07/18/21  2:10 AM   Specimen: BLOOD  Result Value Ref Range Status   Specimen Description   Final    BLOOD BLOOD RIGHT FOREARM Performed at [place] [address] [place], [place] [address]    Special Requests   Final    BOTTLES DRAWN AEROBIC ONLY Blood Culture adequate volume Performed at [place] [address], [place], [place] [address]    Culture   Final    NO GROWTH 4 DAYS Performed at [facility], [place] [address], [place], [place] [address]    Report Status PENDING  Incomplete  Surgical pcr screen     Status: None   Collection Time: 07/20/21  9:05 AM   Specimen: Nasal Mucosa; Nasal Swab  Result Value Ref Range Status   MRSA, PCR NEGATIVE NEGATIVE Final  Staphylococcus aureus NEGATIVE NEGATIVE Final    Comment: (NOTE) The Xpert SA Assay (FDA approved for NASAL specimens in patients 28 years of age and older), is one component of a comprehensive surveillance program. It is not intended to diagnose infection nor to guide or monitor  treatment. Performed at [facility], [place] [address], [place], [place] [address]          Oswald Hillock   [facility] If 7PM-7AM, please contact night-coverage at [email or web address], Office  [phone]   07/22/2021, 1:31 PM  LOS: 4 days

## 2021-07-22 NOTE — Progress Notes (Addendum)
Progress Note  Patient Name: [name] [name] Date of Encounter: 07/22/2021  [facility] Cardiologist: Jenean Lindau, MD   Subjective   Feels very weak and tired, had diarrhea today, just got something for that. No chest pain or SOB I/O -2.8L yesterday Cr 2.30  Inpatient Medications    Scheduled Meds:  amoxicillin-clavulanate  1 tablet Oral Q12H   apixaban  5 mg Oral BID   furosemide  40 mg Oral Daily   rosuvastatin  40 mg Oral QHS   Continuous Infusions:   PRN Meds: acetaminophen **OR** acetaminophen, alum & mag hydroxide-simeth, HYDROmorphone (DILAUDID) injection, insulin aspart   Vital Signs    Vitals:   07/21/21 1140 07/21/21 2011 07/22/21 0500 07/22/21 0505  BP: 100/70 114/71  107/75  Pulse: 66 64  64  Resp: '18 20  20  '$ Temp: (!) 97.4 F (36.3 C)   97.6 F (36.4 C)  TempSrc: Oral   Oral  SpO2: 97% 99%  91%  Weight:   93.1 kg   Height:        Intake/Output Summary (Last 24 hours) at 07/22/2021 1150 Last data filed at 07/22/2021 0947 Gross per 24 hour  Intake 220 ml  Output 3525 ml  Net -3305 ml   Last 3 Weights 07/22/2021 07/21/2021 07/20/2021  Weight (lbs) 205 lb 4 oz 215 lb 6.2 oz 226 lb 6.6 oz  Weight (kg) 93.1 kg 97.7 kg 102.7 kg      Telemetry    Appears to be atrial flutter most of the time, frequent PVCs. - Personally Reviewed  ECG    08/30 ECG is Afib with RVR, rate 113, RBBB - Personally Reviewed  Physical Exam   General: Well developed, well nourished, male in no acute distress, but appears very weak Head: Eyes PERRLA, Head normocephalic and atraumatic Lungs: essentially clear to auscultation. Heart: HRRR S1 S2, without rub or gallop. No murmur. 4/4 extremity pulses are 2+ & equal. No JVD. Abdomen: Bowel sounds are present, abdomen soft and tender without masses or  hernias noted. Msk: Normal strength and tone for age. Extremities: No clubbing, cyanosis or edema.    Skin:  No rashes or lesions noted. Neuro: Alert and oriented X  3. Psych:  Good affect, responds appropriately  Labs    High Sensitivity Troponin:   Recent Labs  Lab 07/12/21 2118 07/12/21 2352  TROPONINIHS 215* 186*      Chemistry Recent Labs  Lab 07/18/21 0210 07/19/21 0513 07/20/21 0547 07/21/21 0541 07/22/21 0538  NA 133* 135 135 139 138  K 4.0 4.2 4.6 3.8 3.6  CL 96* 101 99 100 98  CO2 '24 25 27 28 26  '$ GLUCOSE 98 121* 176* 149* 156*  BUN 43* 40* 47* 53* 69*  CREATININE 2.01* 2.12* 2.47* 2.65* 2.30*  CALCIUM 9.3 9.3 9.5 9.6 9.3  PROT 6.4* 6.4* 6.4*  --   --   ALBUMIN 3.1* 2.8* 2.7*  --   --   AST 49* 53* 57*  --   --   ALT '21 24 24  '$ --   --   ALKPHOS 169* 210* 277*  --   --   BILITOT 3.7* 3.4* 3.2*  --   --   GFRNONAA 35* 33* 28* 25* 30*  ANIONGAP '13 9 9 11 14     '$ Hematology Recent Labs  Lab 07/19/21 0513 07/20/21 0547 07/22/21 0538  WBC 12.3* 8.8 9.8  RBC 6.33* 6.12* 6.35*  HGB 16.1 15.9 16.2  HCT 50.7 49.5  49.5  MCV 80.1 80.9 78.0*  MCH 25.4* 26.0 25.5*  MCHC 31.8 32.1 32.7  RDW 21.6* 21.6* 21.3*  PLT 86* 106* 130*    BNP Recent Labs  Lab 07/21/21 0541  BNP 880.7*    Lab Results  Component Value Date   TSH 2.581 05/04/2021     DDimer No results for input(s): DDIMER in the last 168 hours.   Radiology    No results found.  Cardiac Studies   Echo 07/19/21:  1. 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 Left ventricular ejection fraction, by estimation, is 20 to 25%. The left ventricle has severely decreased function. The left ventricle demonstrates global hypokinesis. There is moderate concentric left ventricular  hypertrophy. Left ventricular diastolic function could not be evaluated. There is the interventricular septum is flattened in systole and diastole, consistent with right ventricular pressure and volume overload. There is global hypokinesis with disporportionately  severe inferior and inferolateral hypokinesis, consistent with scar in the right coronary artery territory.   2. Right ventricular systolic function is severely reduced. Tricuspid  regurgitation signal is inadequate for assessing PA pressure.   3. Left atrial size was mild to moderately dilated.   4. Right atrial size was severely dilated.   5. The pericardial effusion is localized near the right atrium.   6. The mitral valve is normal in structure. No evidence of mitral valve regurgitation. No evidence of mitral stenosis.   7. The aortic valve is normal in structure. There is mild thickening of  the aortic valve. Aortic valve regurgitation is not visualized. Mild  aortic valve sclerosis is present, with no evidence of aortic valve  stenosis.   8. The inferior vena cava is dilated in size with <50% respiratory  variability, suggesting right atrial pressure of 15 mmHg.   Patient Profile     71 y.o. male with a hx of CAD, NSTEMI 2009 with PCI of RCA with overlapping DES stents, HTN ,HLD, DM and chronic systolic CHF  who is being seen 07/18/2021 for the evaluation of CHF and pre-op eval for cholecystotomy  at the request of Dr. Starla Link.    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  His RCRI score is 5 (CHF, CAD, CKD, IDDM, intraperitoneal surgery), which corresponds to 15% 30-day risk of death, MI, or cardiac arrest.  He recently underwent cardiac catheterization with moderate disease but no targets for intervention.  No further ischemic workup recommended at this time. Would classify as high risk for an intermediate risk surgery.  Discussed with Dr Dema Severin and Dr Darrick Meigs.  He is a high risk surgical candidate, and considering he had hypotension and hypoxia with sedation or ERCP, I am concerned about his ability to tolerate surgery.  Per Dr. Dema Severin, benefit of cholecystectomy  would be in preventing future episodes of choledocholithiasis but watchful waiting is another approach.  In discussion with Dr. Dema Severin and Dr. Darrick Meigs, plan to hold off on surgery for now.  Hopefully with treatment of his heart failure can see improvement in systolic function, and if he has recurrence of gallstones, may be in a better position to undergo surgery at that time.   Acute on chronic combined systolic and diastolic heart failure: EF 20-25%, severe RV dysfunction, findings concerning for amyloid. -PYP scan planned as outpatient to evaluate for amyloid.  Will check  SPEP/UPEP/light chains -Hold spironolactone given AKI -Hold Farxiga in setting of AKI -No ACE/ARB/Arni in setting of AKI -Suspect decompensated following IV fluids received for ERCP.  Lactate elevated on 8/31, had good response to IV Lasix and lactate has normalized.  Renal function improving, appears euvolemic, will transition to PO lasix 40 mg daily   Paroxysmal atrial fibrillation/flutter: Intermittent A. fib on admission, now in AFL with rate 60s -Continue Eliquis 5 mg BID -Rates well controlled despite no AV nodal blocking agents   CAD: s/p NSTEMI in 2008 with stents to RCA and LAD.  Cath on 05/05/2021 showed moderate CAD but no significant obstruction requiring intervention (25% left main disease, 5% proximal to mid RCA, 25% proximal RCA, 70% distal RCA, 20% ostial LCx, 30% proximal to mid LCx, 20% proximal to mid LAD, 30% D1, 50% mid LAD).   -Continue Eliquis, statin   AKI on CKD: baseline Cr appears 1.5-1.9, peaked at 2.7, improved to 2.3 today.  Creatinine started trending up after ERCP, could be ATN from hypotensive episode.  Appears to be improving.     For questions or updates, please contact [place] Please consult [email or web address] for contact info under        Signed, Rosaria Ferries, PA-C  07/22/2021, 11:50 AM    Patient seen and examined.  Agree with above documentation.  On exam, patient is alert and  oriented, regular rate and rhythm, no murmurs, lungs CTAB, no LE edema or JVD.  Labs show improvement in creatinine to 2.3.  We will transition to p.o. Lasix 40 mg daily.  I spoke with his son-in-law to give him an update.  Donato Heinz, MD

## 2021-07-23 DIAGNOSIS — A0472 Enterocolitis due to Clostridium difficile, not specified as recurrent: Secondary | ICD-10-CM

## 2021-07-23 DIAGNOSIS — I484 Atypical atrial flutter: Secondary | ICD-10-CM | POA: Diagnosis not present

## 2021-07-23 DIAGNOSIS — Z01810 Encounter for preprocedural cardiovascular examination: Secondary | ICD-10-CM | POA: Diagnosis not present

## 2021-07-23 DIAGNOSIS — I5043 Acute on chronic combined systolic (congestive) and diastolic (congestive) heart failure: Secondary | ICD-10-CM | POA: Diagnosis not present

## 2021-07-23 DIAGNOSIS — K810 Acute cholecystitis: Secondary | ICD-10-CM | POA: Diagnosis not present

## 2021-07-23 DIAGNOSIS — I480 Paroxysmal atrial fibrillation: Secondary | ICD-10-CM | POA: Diagnosis not present

## 2021-07-23 DIAGNOSIS — I5022 Chronic systolic (congestive) heart failure: Secondary | ICD-10-CM

## 2021-07-23 LAB — CBC WITH DIFFERENTIAL/PLATELET
Abs Immature Granulocytes: 0.14 10*3/uL — ABNORMAL HIGH (ref 0.00–0.07)
Basophils Absolute: 0 10*3/uL (ref 0.0–0.1)
Basophils Relative: 0 %
Eosinophils Absolute: 0 10*3/uL (ref 0.0–0.5)
Eosinophils Relative: 0 %
HCT: 42 % (ref 39.0–52.0)
Hemoglobin: 13.3 g/dL (ref 13.0–17.0)
Immature Granulocytes: 1 %
Lymphocytes Relative: 7 %
Lymphs Abs: 1 10*3/uL (ref 0.7–4.0)
MCH: 25.4 pg — ABNORMAL LOW (ref 26.0–34.0)
MCHC: 31.7 g/dL (ref 30.0–36.0)
MCV: 80.3 fL (ref 80.0–100.0)
Monocytes Absolute: 0.8 10*3/uL (ref 0.1–1.0)
Monocytes Relative: 6 %
Neutro Abs: 12.2 10*3/uL — ABNORMAL HIGH (ref 1.7–7.7)
Neutrophils Relative %: 86 %
Platelets: 112 10*3/uL — ABNORMAL LOW (ref 150–400)
RBC: 5.23 MIL/uL (ref 4.22–5.81)
RDW: 21.1 % — ABNORMAL HIGH (ref 11.5–15.5)
WBC: 14.1 10*3/uL — ABNORMAL HIGH (ref 4.0–10.5)
nRBC: 0 % (ref 0.0–0.2)

## 2021-07-23 LAB — CULTURE, BLOOD (ROUTINE X 2)
Culture: NO GROWTH
Culture: NO GROWTH
Special Requests: ADEQUATE
Special Requests: ADEQUATE

## 2021-07-23 LAB — GLUCOSE, CAPILLARY
Glucose-Capillary: 100 mg/dL — ABNORMAL HIGH (ref 70–99)
Glucose-Capillary: 158 mg/dL — ABNORMAL HIGH (ref 70–99)
Glucose-Capillary: 168 mg/dL — ABNORMAL HIGH (ref 70–99)
Glucose-Capillary: 187 mg/dL — ABNORMAL HIGH (ref 70–99)

## 2021-07-23 LAB — C DIFFICILE (CDIFF) QUICK SCRN (NO PCR REFLEX)
C Diff antigen: POSITIVE — AB
C Diff interpretation: DETECTED
C Diff toxin: POSITIVE — AB

## 2021-07-23 LAB — BASIC METABOLIC PANEL
Anion gap: 10 (ref 5–15)
BUN: 78 mg/dL — ABNORMAL HIGH (ref 8–23)
CO2: 26 mmol/L (ref 22–32)
Calcium: 9.2 mg/dL (ref 8.9–10.3)
Chloride: 104 mmol/L (ref 98–111)
Creatinine, Ser: 2.05 mg/dL — ABNORMAL HIGH (ref 0.61–1.24)
GFR, Estimated: 35 mL/min — ABNORMAL LOW (ref >60)
Glucose, Bld: 149 mg/dL — ABNORMAL HIGH (ref 70–99)
Potassium: 4.2 mmol/L (ref 3.5–5.1)
Sodium: 140 mmol/L (ref 135–145)

## 2021-07-23 LAB — MAGNESIUM: Magnesium: 2.1 mg/dL (ref 1.7–2.4)

## 2021-07-23 MED ORDER — ALUM & MAG HYDROXIDE-SIMETH 200-200-20 MG/5ML PO SUSP
30.0000 mL | Freq: Once | ORAL | Status: AC
  Administered 2021-07-23: 30 mL via ORAL
  Filled 2021-07-23: qty 30

## 2021-07-23 MED ORDER — VANCOMYCIN HCL 125 MG PO CAPS
125.0000 mg | ORAL_CAPSULE | Freq: Four times a day (QID) | ORAL | Status: AC
  Administered 2021-07-23 – 2021-08-02 (×37): 125 mg via ORAL
  Filled 2021-07-23 (×43): qty 1

## 2021-07-23 MED ORDER — FAMOTIDINE 20 MG PO TABS
20.0000 mg | ORAL_TABLET | Freq: Every day | ORAL | Status: DC
  Administered 2021-07-24: 20 mg via ORAL
  Filled 2021-07-23: qty 1

## 2021-07-23 MED ORDER — SODIUM CHLORIDE 0.9 % IV SOLN: INTRAVENOUS | Status: AC

## 2021-07-23 NOTE — Progress Notes (Signed)
This writer noticed some blood in pt's stool. On-call NP notified. Received an order for CBC.

## 2021-07-23 NOTE — Progress Notes (Signed)
PHARMACIST - PHYSICIAN COMMUNICATION  DR:  Darrick Meigs  CONCERNING: IV to Oral Route Change Policy  RECOMMENDATION: This patient is receiving famotidine by the intravenous route.  Based on criteria approved by the Pharmacy and Therapeutics Committee, the intravenous medication(s) is/are being converted to the equivalent oral dose form(s).   DESCRIPTION: These criteria include: The patient is eating (either orally or via tube) and/or has been taking other orally administered medications for a least 24 hours The patient has no evidence of active gastrointestinal bleeding or impaired GI absorption (gastrectomy, short bowel, patient on TNA or NPO).  Famotidine on national backorder. Patient taking other PO medications; transitioned to famotidine PO.  If you have questions about this conversion, please contact the Pharmacy Department  '[]'$   [phone] )  [facility] '[]'$   [phone] )  [facility] '[]'$   [phone] )  [facility] '[]'$   [phone] )  [facility] '[x]'$   [phone] )  [place], [facility] 07/23/2021 12:09 PM

## 2021-07-23 NOTE — Progress Notes (Signed)
Pt noted to have new streaks of blood in his stool. RN informed charge Therapist, sports and night shift Therapist, sports.

## 2021-07-23 NOTE — Progress Notes (Signed)
PT Cancellation Note  Patient Details Name: [name] [name] MRN: [id number] DOB: [birth year]   Cancelled Treatment:    Reason Eval/Treat Not Completed: Patient declined, no reason specified. Checked on patient a 2nd time as he was getting pain med and he agreed to PT.  PT got his history info and then when he came time for mobility he declined.  Pt checked on a 3rd time and pt stated he actually felt the best he had in days, but still declined PT. He was apologetic, but said he would do it tomorrow. Will check back as schedule permits.   Galen Manila 07/23/2021, 3:03 PM

## 2021-07-23 NOTE — Progress Notes (Signed)
PT Cancellation Note  Patient Details Name: [name] [name] MRN: [id number] DOB: 1951-04-04   Cancelled Treatment:    Reason Eval/Treat Not Completed: Patient declined, no reason specified. Pt stating he was waiting for something for indigestion and diarrhea and declined PT. Will check back later.   Galen Manila 07/23/2021, 10:05 AM

## 2021-07-23 NOTE — Progress Notes (Signed)
Progress Note  Patient Name: [name] [name] Date of Encounter: 07/23/2021  [facility] Cardiologist: Jenean Lindau, MD   Subjective   Having diarrhea since Augmentin started. Lying flat in bed without dyspnea.  AFib rate control is good, 80s. Creatinine better, but BUN rising.  Inpatient Medications    Scheduled Meds:  apixaban  5 mg Oral BID   furosemide  40 mg Oral Daily   rosuvastatin  40 mg Oral QHS   Continuous Infusions:  famotidine (PEPCID) IV Stopped (07/22/21 1748)   PRN Meds: acetaminophen **OR** acetaminophen, alum & mag hydroxide-simeth, HYDROmorphone (DILAUDID) injection, insulin aspart, ondansetron (ZOFRAN) IV   Vital Signs    Vitals:   07/22/21 0505 07/22/21 1325 07/22/21 2002 07/23/21 0500  BP: 107/75 129/90 111/83   Pulse: 64 68 69   Resp: '20 18 20   '$ Temp: 97.6 F (36.4 C) 97.8 F (36.6 C) (!) 97.4 F (36.3 C)   TempSrc: Oral     SpO2: 91% 96% 95%   Weight:    88.9 kg  Height:        Intake/Output Summary (Last 24 hours) at 07/23/2021 0730 Last data filed at 07/23/2021 0500 Gross per 24 hour  Intake 336.08 ml  Output 3050 ml  Net -2713.92 ml   Last 3 Weights 07/23/2021 07/22/2021 07/21/2021  Weight (lbs) 195 lb 15.8 oz 205 lb 4 oz 215 lb 6.2 oz  Weight (kg) 88.9 kg 93.1 kg 97.7 kg      Telemetry    AF w controlled rate - Personally Reviewed  ECG    A flutter with mostly 4:1 AV block- Personally Reviewed  Physical Exam  Elderly, frail GEN: No acute distress.   Neck: No JVD Cardiac: RRR, no murmurs, rubs, or gallops.  Respiratory: Clear to auscultation bilaterally. GI: Soft, nontender, non-distended  MS: No edema; No deformity. Neuro:  Nonfocal  Psych: Normal affect   Labs    High Sensitivity Troponin:   Recent Labs  Lab 07/12/21 2118 07/12/21 2352  TROPONINIHS 215* 186*      Chemistry Recent Labs  Lab 07/18/21 0210 07/19/21 0513 07/20/21 0547 07/21/21 0541 07/22/21 0538 07/23/21 0555  NA 133* 135 135 139  138 140  K 4.0 4.2 4.6 3.8 3.6 4.2  CL 96* 101 99 100 98 104  CO2 '24 25 27 28 26 26  '$ GLUCOSE 98 121* 176* 149* 156* 149*  BUN 43* 40* 47* 53* 69* 78*  CREATININE 2.01* 2.12* 2.47* 2.65* 2.30* 2.05*  CALCIUM 9.3 9.3 9.5 9.6 9.3 9.2  PROT 6.4* 6.4* 6.4*  --   --   --   ALBUMIN 3.1* 2.8* 2.7*  --   --   --   AST 49* 53* 57*  --   --   --   ALT '21 24 24  '$ --   --   --   ALKPHOS 169* 210* 277*  --   --   --   BILITOT 3.7* 3.4* 3.2*  --   --   --   GFRNONAA 35* 33* 28* 25* 30* 35*  ANIONGAP '13 9 9 11 14 10     '$ Hematology Recent Labs  Lab 07/19/21 0513 07/20/21 0547 07/22/21 0538  WBC 12.3* 8.8 9.8  RBC 6.33* 6.12* 6.35*  HGB 16.1 15.9 16.2  HCT 50.7 49.5 49.5  MCV 80.1 80.9 78.0*  MCH 25.4* 26.0 25.5*  MCHC 31.8 32.1 32.7  RDW 21.6* 21.6* 21.3*  PLT 86* 106* 130*  BNP Recent Labs  Lab 07/21/21 0541  BNP 880.7*     DDimer No results for input(s): DDIMER in the last 168 hours.   Radiology    CT HEAD WO CONTRAST (5MM)  Result Date: 07/22/2021 CLINICAL DATA:  Mental status change.  Unknown cause. EXAM: CT HEAD WITHOUT CONTRAST TECHNIQUE: Contiguous axial images were obtained from the base of the skull through the vertex without intravenous contrast. COMPARISON:  CT head 11/04/2018. FINDINGS: Brain: There is no evidence of acute intracranial hemorrhage, mass lesion, brain edema or extra-axial fluid collection. Stable mild atrophy with prominence of the ventricles and subarachnoid spaces. There are mild chronic small vessel ischemic changes in the periventricular white matter. There is no CT evidence of acute cortical infarction. Vascular: Intracranial vascular calcifications. No hyperdense vessel identified. Skull: Negative for fracture or focal lesion. Sinuses/Orbits: The visualized paranasal sinuses and mastoid air cells are clear. No orbital abnormalities are seen. Other: None. IMPRESSION: 1. Stable head CT without acute intracranial findings. 2. Mild atrophy and chronic small  vessel ischemic changes. Electronically Signed   By: Richardean Sale M.D.   On: 07/22/2021 17:53    Cardiac Studies   Echo 07/19/21:  1. 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 Left ventricular ejection fraction, by estimation, is 20 to 25%. The left ventricle has severely decreased function. The left ventricle demonstrates global hypokinesis. There is moderate concentric left ventricular  hypertrophy. Left ventricular diastolic function could not be evaluated. There is the interventricular septum is flattened in systole and diastole, consistent with right ventricular pressure and volume overload. There is global hypokinesis with disporportionately severe inferior and inferolateral hypokinesis, consistent with scar in the right coronary artery territory.   2. Right ventricular systolic function is severely reduced. Tricuspid  regurgitation signal is inadequate for assessing PA pressure.   3. Left atrial size was mild to moderately dilated.   4. Right atrial size was severely dilated.   5. The pericardial effusion is localized near the right atrium.   6. The mitral valve is normal in structure. No evidence of mitral valve regurgitation. No evidence of mitral stenosis.   7. The aortic valve is normal in structure. There is mild thickening of  the aortic valve. Aortic valve regurgitation is not visualized. Mild  aortic valve sclerosis is present, with no evidence of aortic valve  stenosis.   8. The inferior vena cava is dilated in size with <50% respiratory  variability, suggesting right atrial pressure of 15 mmHg.   Patient Profile     70 y.o. male with a hx of CAD, NSTEMI 2009 with PCI of RCA with overlapping DES stents, HTN ,HLD, DM and chronic systolic CHF  who is being seen 07/18/2021 for the evaluation of CHF and pre-op eval for cholecystotomy  at the request of Dr.  Starla Link.  Assessment & Plan    Ac on chr syst+diast HF: appears close to euvolemic status. Net diuresis 2.7 l yesterday, 8 liters for the hospitalization. Lowest BNP this year 25, was 881 on 09/01. Concern for amyloidosis. No evidence of monoclonal spike/light chain imbalance. Hold furosemide while he is having diarrhea. CAD: recent cath without revascularization targets. Denies angina. Parox AFib/Atrial flutter: rate controlled . On eliquis. AKI on CKD3: improved creatinine, but BUN still rising since yesterday. Hold diuretic while having diarrhea. Cholelithiasis: watchful waiting for now, until he is hemodynamically improved. He had significant decompensation just w ERCP. [facility] will sign  off.   Medication Recommendations:  continue current cardiac meds, resume furosemide 40 mg daily once diarrhea resolves Other recommendations (labs, testing, etc):  outpatient amyloid cardiac scan (PYP) - will schedule Follow up as an outpatient:  Has appt scheduled 07/26/2021      For questions or updates, please contact [place] Please consult [email or web address] for contact info under        Signed, Sanda Klein, MD  07/23/2021, 7:30 AM

## 2021-07-23 NOTE — Progress Notes (Signed)
Abrasion of.  Lipid repeat evaluation [facility]ed[facility]  PROGRESS NOTE  [name] [name] [id number] DOB: 04-Dec-1950 DOA: 07/17/2021 PCP: Raeanne Gathers, MD   Brief HPI:   70 year old male with history of diabetes mellitus type 2, chronic systolic CHF, CKD stage III, paroxysmal atrial fibrillation, CAD, hypertension, osteoarthritis, patient presented with abdominal pain and diarrhea.  On presentation was found to have acute cholecystitis with choledocholithiasis and mild pancreatitis along with leukocytosis elevated LFTs.  He was started on broad-spectrum antibiotics.  GI and general surgery were consulted.  Patient was supposed to get cholecystectomy however surgery was canceled as cardiology did not recommend patient to undergo surgery due to risk involved with surgery.  Subjective   Patient seen and examined, continues to have diarrhea.  Called and discussed with ID that patient is a high risk for C. difficile infection considering recent antibiotic use.  C. difficile PCR obtained which turned out to be positive.   Assessment/Plan:     C. difficile diarrhea -Patient developed diarrhea since 07/21/2021 after starting Augmentin -Since he did not have any symptoms of abdominal pain fever or leukocytosis, he was given 1 dose of Imodium with some improvement -Diarrhea restarted with multiple episodes of loose stools -Called and discussed with ID, though patient did not have symptoms but had recent antibiotic use so he is at high risk for developing C. difficile infection -C. difficile PCR obtained, which turned out to be positive -We will start patient on vancomycin 125 mg p.o. 4 times daily -Contact precautions -We will start gentle IV hydration with normal saline at 50 mill per hour for 12 hours. -We will be judicious in using IV fluids as patient has history of underlying CHF.  Acute calculus cholecystitis/choledocholithiasis/biliary pancreatitis -Patient underwent ERCP  which showed choledocholithiasis, underwent stone removal with sphincterotomy -Cholecystectomy was scheduled for this morning however it was canceled after cardiology recommended against it -General surgery has signed off and will follow patient in their office -Patient received 3 days of IV Zosyn, IV Zosyn was discontinued and patient started on Augmentin 1 tablet p.o. twice daily for 2 more days.  Antibiotic treatment was completed on  07/22/2021.  Acute on chronic combined systolic and diastolic heart failure -Patient has EF of 20 to 25%, severe RV dysfunction, findings concerning for amyloid -Cardiology has ordered PYP scan to evaluate for amyloid, SPEP/UPEP/light chains -Started on IV Lasix for fluid overload, Lasix currently on hold due to worsening renal function -Cardiology discontinued IV Lasix and started on p.o. Lasix -P.o. Lasix is currently on hold due to ongoing diarrhea.  Thrombocytopenia -Resolved -Likely from underlying cholecystitis  Paroxysmal atrial fibrillation -Eliquis has been restarted;  will discontinue aspirin as Eliquis has been restarted -Beta-blocker on hold considering CHF as above  CAD s/p NSTEMI in 2008 with stent to RCA and LAD -Continue statin, aspirin  Diabetes mellitus type 2 -[place] is currently on hold due to acute kidney injury -Continue sliding scale insulin with NovoLog   Acute kidney injury on CKD stage IIIb -Patient baseline creatinine is around 1.8 -Presented with creatinine of 2.02, which has worsened to 2.65 yesterday.  Creatinine has improved to 2.02 after holding Lasix.  Creatinine likely at baseline -Likely from CHF exacerbation, also patient became hypotensive yesterday during ERCP requiring pressor support -He was started on scheduled IV Lasix 40 mg IV every 12 hours.  IV Lasix is currently on hold for worsening renal function.    Scheduled medications:    apixaban  5 mg Oral BID   [  START ON 07/24/2021] famotidine  20 mg Oral Daily    rosuvastatin  40 mg Oral QHS   vancomycin  125 mg Oral QID         Data Reviewed:   CBG:  Recent Labs  Lab 07/22/21 1148 07/22/21 1738 07/22/21 2340 07/23/21 0520 07/23/21 1245  GLUCAP 173* 158* 176* 168* 187*    SpO2: 98 % O2 Flow Rate (L/min): 2 L/min    Vitals:   07/22/21 1325 07/22/21 2002 07/23/21 0500 07/23/21 1242  BP: 129/90 111/83  112/81  Pulse: 68 69  91  Resp: '18 20  17  '$ Temp: 97.8 F (36.6 C) (!) 97.4 F (36.3 C)  98.6 F (37 C)  TempSrc:      SpO2: 96% 95%  98%  Weight:   88.9 kg   Height:         Intake/Output Summary (Last 24 hours) at 07/23/2021 1514 Last data filed at 07/23/2021 0500 Gross per 24 hour  Intake 336.08 ml  Output 2300 ml  Net -1963.92 ml    09/01 1901 - 09/03 0700 In: 336.1 [P.O.:236] Out: 4725 [Urine:4725]  Filed Weights   07/21/21 0459 07/22/21 0500 07/23/21 0500  Weight: 97.7 kg 93.1 kg 88.9 kg    CBC:  Recent Labs  Lab 07/17/21 1848 07/18/21 0210 07/19/21 0513 07/20/21 0547 07/22/21 0538  WBC 16.5* 14.2* 12.3* 8.8 9.8  HGB 16.8 16.2 16.1 15.9 16.2  HCT 53.2* 49.0 50.7 49.5 49.5  PLT 88* 82* 86* 106* 130*  MCV 82.2 79.3* 80.1 80.9 78.0*  MCH 26.0 26.2 25.4* 26.0 25.5*  MCHC 31.6 33.1 31.8 32.1 32.7  RDW 21.2* 21.0* 21.6* 21.6* 21.3*  LYMPHSABS 0.4*  --  0.5* 0.4*  --   MONOABS 1.4*  --  1.2* 0.5  --   EOSABS 0.0  --  0.0 0.0  --   BASOSABS 0.0  --  0.0 0.0  --     Complete metabolic panel:  Recent Labs  Lab 07/17/21 1848 07/18/21 0210 07/19/21 0513 07/20/21 0547 07/20/21 1354 07/21/21 0541 07/22/21 0538 07/23/21 0555  NA 133* 133* 135 135  --  139 138 140  K 4.2 4.0 4.2 4.6  --  3.8 3.6 4.2  CL 96* 96* 101 99  --  100 98 104  CO2 '25 24 25 27  '$ --  '28 26 26  '$ GLUCOSE 128* 98 121* 176*  --  149* 156* 149*  BUN 44* 43* 40* 47*  --  53* 69* 78*  CREATININE 2.00* 2.01* 2.12* 2.47*  --  2.65* 2.30* 2.05*  CALCIUM 9.4 9.3 9.3 9.5  --  9.6 9.3 9.2  AST 51* 49* 53* 57*  --   --   --   --    ALT '23 21 24 24  '$ --   --   --   --   ALKPHOS 171* 169* 210* 277*  --   --   --   --   BILITOT 4.1* 3.7* 3.4* 3.2*  --   --   --   --   ALBUMIN 3.3* 3.1* 2.8* 2.7*  --   --   --   --   MG  --   --  2.2 2.2  --  2.2 1.9 2.1  LATICACIDVEN  --   --   --   --  2.0* 1.5  --   --   HGBA1C  --  6.6*  --   --   --   --   --   --  BNP  --   --   --   --   --  880.7*  --   --     Recent Labs  Lab 07/17/21 1848 07/19/21 0513 07/20/21 0547  LIPASE 105* 40 63*    Recent Labs  Lab 07/17/21 2001 07/21/21 0541  BNP  --  880.7*  SARSCOV2NAA NEGATIVE  --     ------------------------------------------------------------------------------------------------------------------ No results for input(s): CHOL, HDL, LDLCALC, TRIG, CHOLHDL, LDLDIRECT in the last 72 hours.  Lab Results  Component Value Date   HGBA1C 6.6 (H) 07/18/2021   ------------------------------------------------------------------------------------------------------------------ No results for input(s): TSH, T4TOTAL, T3FREE, THYROIDAB in the last 72 hours.  Invalid input(s): FREET3 ------------------------------------------------------------------------------------------------------------------ No results for input(s): VITAMINB12, FOLATE, FERRITIN, TIBC, IRON, RETICCTPCT in the last 72 hours.  Coagulation profile No results for input(s): INR, PROTIME in the last 168 hours. No results for input(s): DDIMER in the last 72 hours.  Cardiac Enzymes No results for input(s): CKTOTAL, CKMB, CKMBINDEX, TROPONINI in the last 168 hours.  ------------------------------------------------------------------------------------------------------------------    Component Value Date/Time   BNP 880.7 (H) 07/21/2021 0541     Antibiotics: Anti-infectives (From admission, onward)    Start     Dose/Rate Route Frequency Ordered Stop   07/23/21 1500  vancomycin (VANCOCIN) capsule 125 mg        125 mg Oral 4 times daily 07/23/21 1358 08/02/21  1359   07/21/21 1400  amoxicillin-clavulanate (AUGMENTIN) 875-125 MG per tablet 1 tablet        1 tablet Oral Every 12 hours 07/21/21 1057 07/22/21 2224   07/18/21 0800  piperacillin-tazobactam (ZOSYN) IVPB 3.375 g  Status:  Discontinued        3.375 g 12.5 mL/hr over 240 Minutes Intravenous Every 8 hours 07/18/21 0142 07/21/21 1057   07/18/21 0015  piperacillin-tazobactam (ZOSYN) IVPB 3.375 g        3.375 g 100 mL/hr over 30 Minutes Intravenous  Once 07/18/21 0007 07/18/21 0129        Radiology Reports  CT HEAD WO CONTRAST (5MM)  Result Date: 07/22/2021 CLINICAL DATA:  Mental status change.  Unknown cause. EXAM: CT HEAD WITHOUT CONTRAST TECHNIQUE: Contiguous axial images were obtained from the base of the skull through the vertex without intravenous contrast. COMPARISON:  CT head 11/04/2018. FINDINGS: Brain: There is no evidence of acute intracranial hemorrhage, mass lesion, brain edema or extra-axial fluid collection. Stable mild atrophy with prominence of the ventricles and subarachnoid spaces. There are mild chronic small vessel ischemic changes in the periventricular white matter. There is no CT evidence of acute cortical infarction. Vascular: Intracranial vascular calcifications. No hyperdense vessel identified. Skull: Negative for fracture or focal lesion. Sinuses/Orbits: The visualized paranasal sinuses and mastoid air cells are clear. No orbital abnormalities are seen. Other: None. IMPRESSION: 1. Stable head CT without acute intracranial findings. 2. Mild atrophy and chronic small vessel ischemic changes. Electronically Signed   By: Richardean Sale M.D.   On: 07/22/2021 17:53      DVT prophylaxis: SCDs  Code Status: Full code  Family Communication: No family at bedside   Consultants: Gastroenterology General surgery  Procedures:     Objective    Physical Examination:  General-appears in no acute distress Heart-S1-S2, regular, no murmur auscultated Lungs-clear to  auscultation bilaterally, no wheezing or crackles auscultated Abdomen-soft, nontender, no organomegaly Extremities-no edema in the lower extremities Neuro-alert, oriented x3, no focal deficit noted  Status is: Inpatient  Dispo: The patient is from: Home  Anticipated d/c is to: Home              Anticipated d/c date is: 07/27/2021              Patient currently not stable for discharge  Barrier to discharge-worsening renal function, C. difficile diarrhea  COVID-19 Labs  No results for input(s): DDIMER, FERRITIN, LDH, CRP in the last 72 hours.  Lab Results  Component Value Date   [place] NEGATIVE 07/17/2021   [place] NEGATIVE 05/04/2021   [place] NEGATIVE 11/30/2020    Microbiology  Recent Results (from the past 240 hour(s))  Resp Panel by RT-PCR (Flu A&B, Covid) Nasopharyngeal Swab     Status: None   Collection Time: 07/17/21  8:01 PM   Specimen: Nasopharyngeal Swab; Nasopharyngeal(NP) swabs in vial transport medium  Result Value Ref Range Status   SARS Coronavirus 2 by RT PCR NEGATIVE NEGATIVE Final    Comment: (NOTE) SARS-CoV-2 target nucleic acids are NOT DETECTED.  The SARS-CoV-2 RNA is generally detectable in upper respiratory specimens during the acute phase of infection. The lowest concentration of SARS-CoV-2 viral copies this assay can detect is 138 copies/mL. A negative result does not preclude SARS-Cov-2 infection and should not be used as the sole basis for treatment or other patient management decisions. A negative result may occur with  improper specimen collection/handling, submission of specimen other than nasopharyngeal swab, presence of viral mutation(s) within the areas targeted by this assay, and inadequate number of viral copies(<138 copies/mL). A negative result must be combined with clinical observations, patient history, and epidemiological information. The expected result is Negative.  Fact Sheet for Patients:   EntrepreneurPulse.com.au  Fact Sheet for Healthcare Providers:  IncredibleEmployment.be  This test is no t yet approved or cleared by the [place] FDA and  has been authorized for detection and/or diagnosis of SARS-CoV-2 by FDA under an Emergency Use Authorization (EUA). This EUA will remain  in effect (meaning this test can be used) for the duration of the COVID-19 declaration under Section 564(b)(1) of the Act, 21 U.S.C.section 360bbb-3(b)(1), unless the authorization is terminated  or revoked sooner.       Influenza A by PCR NEGATIVE NEGATIVE Final   Influenza B by PCR NEGATIVE NEGATIVE Final    Comment: (NOTE) The Xpert Xpress SARS-CoV-2/FLU/RSV plus assay is intended as an aid in the diagnosis of influenza from Nasopharyngeal swab specimens and should not be used as a sole basis for treatment. Nasal washings and aspirates are unacceptable for Xpert Xpress SARS-CoV-2/FLU/RSV testing.  Fact Sheet for Patients: EntrepreneurPulse.com.au  Fact Sheet for Healthcare Providers: IncredibleEmployment.be  This test is not yet approved or cleared by the [place] FDA and has been authorized for detection and/or diagnosis of SARS-CoV-2 by FDA under an Emergency Use Authorization (EUA). This EUA will remain in effect (meaning this test can be used) for the duration of the COVID-19 declaration under Section 564(b)(1) of the Act, 21 U.S.C. section 360bbb-3(b)(1), unless the authorization is terminated or revoked.  Performed at [facility], [place] [address] [place], [place] [address]   Urine Culture     Status: None   Collection Time: 07/17/21 11:58 PM   Specimen: Urine, Clean Catch  Result Value Ref Range Status   Specimen Description   Final    URINE, CLEAN CATCH Performed at [facility], [place] [address] [place], [place] [address]    Special Requests   Final     NONE Performed at [facility], [place] [address] [place], [place] [address]    Culture   Final    NO GROWTH Performed at [facility], [place] [address] [place], [place] [address]    Report Status 07/19/2021 FINAL  Final  Blood culture (routine x 2)     Status: None   Collection Time: 07/18/21  2:10 AM   Specimen: BLOOD  Result Value Ref Range Status   Specimen Description   Final    BLOOD LEFT ANTECUBITAL Performed at [place] [address], [place], [place] [address]    Special Requests   Final    BOTTLES DRAWN AEROBIC ONLY Blood Culture adequate volume Performed at [place] [address] [place], [place] [address]    Culture   Final    NO GROWTH 5 DAYS Performed at [facility], [place] [address] [place], [place] [address]    Report Status 07/23/2021 FINAL  Final  Blood culture (routine x 2)     Status: None   Collection Time: 07/18/21  2:10 AM   Specimen: BLOOD  Result Value Ref Range Status   Specimen Description   Final    BLOOD BLOOD RIGHT FOREARM Performed at [place] [address], [place], [place] [address]    Special Requests   Final    BOTTLES DRAWN AEROBIC ONLY Blood Culture adequate volume Performed at [place] [address], [place], [place] [address]    Culture   Final    NO GROWTH 5 DAYS Performed at [facility], [place] [address] [place], [place] [address]    Report Status 07/23/2021 FINAL  Final  Surgical pcr screen     Status: None   Collection Time: 07/20/21  9:05 AM   Specimen: Nasal Mucosa; Nasal Swab  Result Value Ref Range Status   MRSA, PCR NEGATIVE NEGATIVE Final   Staphylococcus aureus NEGATIVE NEGATIVE Final    Comment: (NOTE) The Xpert SA Assay (FDA approved for NASAL specimens in patients 42 years of age and older), is one component of a comprehensive surveillance program. It is not intended to  diagnose infection nor to guide or monitor treatment. Performed at [facility], [place] [address] [place], [place] [address]   C Difficile Quick Screen (NO PCR Reflex)     Status: Abnormal   Collection Time: 07/23/21  9:20 AM   Specimen: STOOL  Result Value Ref Range Status   C Diff antigen POSITIVE (A) NEGATIVE Final   C Diff toxin POSITIVE (A) NEGATIVE Final   C Diff interpretation Toxin producing C. difficile detected.  Final    Comment: CRITICAL RESULT CALLED TO, READ BACK BY AND VERIFIED WITH: Joeseph Amor RN Performed at [place] [address] [place], [place] [address]          Oswald Hillock   [facility] If 7PM-7AM, please contact night-coverage at [email or web address], Office  [phone]   07/23/2021, 3:14 PM  LOS: 5 days

## 2021-07-24 ENCOUNTER — Encounter (HOSPITAL_COMMUNITY): Payer: Self-pay | Admitting: Certified Registered Nurse Anesthetist

## 2021-07-24 ENCOUNTER — Encounter (HOSPITAL_COMMUNITY)
Admission: EM | Disposition: A | Discharge status: Skilled Nursing Facility | Payer: Self-pay | Source: Home / Self Care | Attending: Internal Medicine

## 2021-07-24 DIAGNOSIS — K810 Acute cholecystitis: Secondary | ICD-10-CM | POA: Diagnosis not present

## 2021-07-24 DIAGNOSIS — I480 Paroxysmal atrial fibrillation: Secondary | ICD-10-CM | POA: Diagnosis not present

## 2021-07-24 DIAGNOSIS — Z01810 Encounter for preprocedural cardiovascular examination: Secondary | ICD-10-CM | POA: Diagnosis not present

## 2021-07-24 DIAGNOSIS — K8050 Calculus of bile duct without cholangitis or cholecystitis without obstruction: Secondary | ICD-10-CM | POA: Diagnosis not present

## 2021-07-24 DIAGNOSIS — I5043 Acute on chronic combined systolic (congestive) and diastolic (congestive) heart failure: Secondary | ICD-10-CM | POA: Diagnosis not present

## 2021-07-24 DIAGNOSIS — N179 Acute kidney failure, unspecified: Secondary | ICD-10-CM | POA: Diagnosis not present

## 2021-07-24 HISTORY — PX: ESOPHAGOGASTRODUODENOSCOPY: SHX5428

## 2021-07-24 HISTORY — PX: SCLEROTHERAPY: SHX6841

## 2021-07-24 HISTORY — PX: HEMOSTASIS CLIP PLACEMENT: SHX6857

## 2021-07-24 LAB — CBC
HCT: 38.9 % — ABNORMAL LOW (ref 39.0–52.0)
HCT: 37.2 % — ABNORMAL LOW (ref 39.0–52.0)
HCT: 31 % — ABNORMAL LOW (ref 39.0–52.0)
Hemoglobin: 12.3 g/dL — ABNORMAL LOW (ref 13.0–17.0)
Hemoglobin: 11.5 g/dL — ABNORMAL LOW (ref 13.0–17.0)
Hemoglobin: 9.6 g/dL — ABNORMAL LOW (ref 13.0–17.0)
MCH: 25.6 pg — ABNORMAL LOW (ref 26.0–34.0)
MCH: 25.9 pg — ABNORMAL LOW (ref 26.0–34.0)
MCH: 25.5 pg — ABNORMAL LOW (ref 26.0–34.0)
MCHC: 31.6 g/dL (ref 30.0–36.0)
MCHC: 30.9 g/dL (ref 30.0–36.0)
MCHC: 31 g/dL (ref 30.0–36.0)
MCV: 81 fL (ref 80.0–100.0)
MCV: 83.8 fL (ref 80.0–100.0)
MCV: 82.2 fL (ref 80.0–100.0)
Platelets: 112 10*3/uL — ABNORMAL LOW (ref 150–400)
Platelets: 116 10*3/uL — ABNORMAL LOW (ref 150–400)
Platelets: 121 10*3/uL — ABNORMAL LOW (ref 150–400)
RBC: 4.8 MIL/uL (ref 4.22–5.81)
RBC: 4.44 MIL/uL (ref 4.22–5.81)
RBC: 3.77 MIL/uL — ABNORMAL LOW (ref 4.22–5.81)
RDW: 21.2 % — ABNORMAL HIGH (ref 11.5–15.5)
RDW: 21.2 % — ABNORMAL HIGH (ref 11.5–15.5)
RDW: 20.9 % — ABNORMAL HIGH (ref 11.5–15.5)
WBC: 13.6 10*3/uL — ABNORMAL HIGH (ref 4.0–10.5)
WBC: 17.5 10*3/uL — ABNORMAL HIGH (ref 4.0–10.5)
WBC: 21.1 10*3/uL — ABNORMAL HIGH (ref 4.0–10.5)
nRBC: 0.1 % (ref 0.0–0.2)
nRBC: 0.2 % (ref 0.0–0.2)
nRBC: 0.4 % — ABNORMAL HIGH (ref 0.0–0.2)

## 2021-07-24 LAB — BASIC METABOLIC PANEL
Anion gap: 9 (ref 5–15)
BUN: 76 mg/dL — ABNORMAL HIGH (ref 8–23)
CO2: 28 mmol/L (ref 22–32)
Calcium: 8.4 mg/dL — ABNORMAL LOW (ref 8.9–10.3)
Chloride: 102 mmol/L (ref 98–111)
Creatinine, Ser: 2.1 mg/dL — ABNORMAL HIGH (ref 0.61–1.24)
GFR, Estimated: 34 mL/min — ABNORMAL LOW (ref >60)
Glucose, Bld: 131 mg/dL — ABNORMAL HIGH (ref 70–99)
Potassium: 4.2 mmol/L (ref 3.5–5.1)
Sodium: 139 mmol/L (ref 135–145)

## 2021-07-24 LAB — MRSA NEXT GEN BY PCR, NASAL: MRSA by PCR Next Gen: NOT DETECTED

## 2021-07-24 LAB — PROTIME-INR
INR: 2.4 — ABNORMAL HIGH (ref 0.8–1.2)
Prothrombin Time: 25.7 seconds — ABNORMAL HIGH (ref 11.4–15.2)

## 2021-07-24 LAB — GLUCOSE, CAPILLARY
Glucose-Capillary: 150 mg/dL — ABNORMAL HIGH (ref 70–99)
Glucose-Capillary: 168 mg/dL — ABNORMAL HIGH (ref 70–99)
Glucose-Capillary: 203 mg/dL — ABNORMAL HIGH (ref 70–99)
Glucose-Capillary: 206 mg/dL — ABNORMAL HIGH (ref 70–99)

## 2021-07-24 SURGERY — EGD (ESOPHAGOGASTRODUODENOSCOPY)
Anesthesia: Monitor Anesthesia Care

## 2021-07-24 MED ORDER — DIPHENHYDRAMINE HCL 50 MG/ML IJ SOLN
INTRAMUSCULAR | Status: DC | PRN
  Administered 2021-07-24: 50 mg via INTRAVENOUS

## 2021-07-24 MED ORDER — NOREPINEPHRINE 4 MG/250ML-% IV SOLN
2.0000 ug/min | INTRAVENOUS | Status: DC
  Administered 2021-07-24: 2 ug/min via INTRAVENOUS
  Filled 2021-07-24 (×2): qty 250

## 2021-07-24 MED ORDER — ZINC OXIDE 12.8 % EX OINT
TOPICAL_OINTMENT | CUTANEOUS | Status: DC | PRN
  Administered 2021-08-01: 1 via TOPICAL
  Filled 2021-07-24: qty 56.7

## 2021-07-24 MED ORDER — SODIUM CHLORIDE 0.9 % IV SOLN: INTRAVENOUS | Status: DC

## 2021-07-24 MED ORDER — PROTHROMBIN COMPLEX CONC HUMAN 500 UNITS IV KIT
4230.0000 [IU] | PACK | Status: AC
  Administered 2021-07-24: 4230 [IU] via INTRAVENOUS
  Filled 2021-07-24: qty 4230

## 2021-07-24 MED ORDER — CHLORHEXIDINE GLUCONATE CLOTH 2 % EX PADS
6.0000 | MEDICATED_PAD | Freq: Every day | CUTANEOUS | Status: DC
  Administered 2021-07-24 – 2021-08-09 (×15): 6 via TOPICAL

## 2021-07-24 MED ORDER — EPINEPHRINE 1 MG/10ML IJ SOSY
PREFILLED_SYRINGE | INTRAMUSCULAR | Status: AC
  Filled 2021-07-24: qty 10

## 2021-07-24 MED ORDER — PANTOPRAZOLE SODIUM 40 MG IV SOLR
40.0000 mg | Freq: Two times a day (BID) | INTRAVENOUS | Status: DC
Start: 2021-07-28 — End: 2021-07-29
  Administered 2021-07-28 (×3): 40 mg via INTRAVENOUS
  Filled 2021-07-24 (×3): qty 40

## 2021-07-24 MED ORDER — GLUCAGON HCL (RDNA) 1 MG IJ SOLR
INTRAMUSCULAR | Status: DC | PRN
  Administered 2021-07-24: .5 mg via INTRAVENOUS

## 2021-07-24 MED ORDER — MIDAZOLAM HCL (PF) 5 MG/ML IJ SOLN
INTRAMUSCULAR | Status: AC
  Filled 2021-07-24: qty 2

## 2021-07-24 MED ORDER — METRONIDAZOLE 500 MG/100ML IV SOLN
500.0000 mg | Freq: Three times a day (TID) | INTRAVENOUS | Status: DC
  Administered 2021-07-24 – 2021-07-27 (×9): 500 mg via INTRAVENOUS
  Filled 2021-07-24 (×9): qty 100

## 2021-07-24 MED ORDER — FENTANYL CITRATE (PF) 100 MCG/2ML IJ SOLN
INTRAMUSCULAR | Status: DC | PRN
  Administered 2021-07-24: 25 ug via INTRAVENOUS
  Administered 2021-07-24 (×2): 50 ug via INTRAVENOUS

## 2021-07-24 MED ORDER — PANTOPRAZOLE 80MG IVPB - SIMPLE MED
80.0000 mg | Freq: Once | INTRAVENOUS | Status: AC
  Administered 2021-07-24: 80 mg via INTRAVENOUS
  Filled 2021-07-24: qty 80

## 2021-07-24 MED ORDER — MIDAZOLAM HCL (PF) 10 MG/2ML IJ SOLN
INTRAMUSCULAR | Status: DC | PRN
  Administered 2021-07-24 (×2): 2 mg via INTRAVENOUS
  Administered 2021-07-24: 1 mg via INTRAVENOUS
  Administered 2021-07-24: 2 mg via INTRAVENOUS

## 2021-07-24 MED ORDER — LACTATED RINGERS IV BOLUS: 500.0000 mL | Freq: Once | INTRAVENOUS | Status: DC

## 2021-07-24 MED ORDER — LACTATED RINGERS IV BOLUS
500.0000 mL | Freq: Once | INTRAVENOUS | Status: AC
  Administered 2021-07-24: 500 mL via INTRAVENOUS

## 2021-07-24 MED ORDER — FENTANYL CITRATE (PF) 100 MCG/2ML IJ SOLN
INTRAMUSCULAR | Status: AC
  Filled 2021-07-24: qty 2

## 2021-07-24 MED ORDER — SODIUM CHLORIDE (PF) 0.9 % IJ SOLN
PREFILLED_SYRINGE | INTRAMUSCULAR | Status: DC | PRN
  Administered 2021-07-24: 3 mL
  Administered 2021-07-24: 4 mL

## 2021-07-24 MED ORDER — PANTOPRAZOLE INFUSION (NEW) - SIMPLE MED
8.0000 mg/h | INTRAVENOUS | Status: AC
  Administered 2021-07-24 – 2021-07-27 (×5): 8 mg/h via INTRAVENOUS
  Filled 2021-07-24: qty 100
  Filled 2021-07-24: qty 80
  Filled 2021-07-24 (×2): qty 100
  Filled 2021-07-24: qty 80
  Filled 2021-07-24: qty 100
  Filled 2021-07-24 (×3): qty 80

## 2021-07-24 MED ORDER — SODIUM CHLORIDE 0.9 % IV SOLN: 250.0000 mL | INTRAVENOUS | Status: DC

## 2021-07-24 MED ORDER — NOREPINEPHRINE 4 MG/250ML-% IV SOLN: 0.0000 ug/min | INTRAVENOUS | Status: DC

## 2021-07-24 NOTE — Evaluation (Signed)
Physical Therapy Evaluation Patient Details Name: [name] [name] MRN: [id number] DOB: [birth year] Today's Date: 07/24/2021   History of Present Illness  70 year old male with history of diabetes mellitus type 2, chronic systolic CHF, CKD stage III, paroxysmal atrial fibrillation, CAD, hypertension, osteoarthritis, patient presented with abdominal pain and diarrhea.  On presentation was found to have acute cholecystitis with choledocholithiasis and mild pancreatitis along with leukocytosis elevated LFTs.  He was started on broad-spectrum antibiotics, + C-Diff.  GI and general surgery were consulted. surgery scheduled then  canceled on recommendation of cardiologist  Clinical Impression  Pt admitted with above diagnosis.  Pt sat EOB with maximum, firm encouragement. Min assist for bed mobility, declined OOB (refused PT x2 yesterday). Explained importance of mobility while in hospital, pt continues to state "you do no understand, I am weak", diminished insight. Recommend SNF   Pt currently with functional limitations due to the deficits listed below (see PT Problem List). Pt will benefit from skilled PT to increase their independence and safety with mobility to allow discharge to the venue listed below.       Follow Up Recommendations SNF    Equipment Recommendations  None recommended by PT    Recommendations for Other Services       Precautions / Restrictions Precautions Precautions: Fall Restrictions Weight Bearing Restrictions: No      Mobility  Bed Mobility Overal bed mobility: Needs Assistance Bed Mobility: Supine to Sit;Sit to Supine     Supine to sit: HOB elevated;Min assist Sit to supine: Min assist   General bed mobility comments: verbal cues to self assist, requires incr time, assist to fully elevate trunk and light assist to guid trunk on return to supine. requires assist to position in supine. max verbal cues and encouragement throughout    Transfers                  General transfer comment: lateral scooting along EOB, pt declines OOB  Ambulation/Gait                Stairs            Wheelchair Mobility    Modified Rankin (Stroke Patients Only)       Balance Overall balance assessment: Needs assistance Sitting-balance support: Bilateral upper extremity supported;Feet supported Sitting balance-Leahy Scale: Poor Sitting balance - Comments: repeated posterior LOB, briefly able to maintain midline with close supervision, max cues                                     Pertinent Vitals/Pain Pain Assessment: Faces Faces Pain Scale: Hurts a little bit Pain Location: back and R side Pain Descriptors / Indicators: Grimacing Pain Intervention(s): Limited activity within patient's tolerance;Monitored during session;Repositioned    Home Living Family/patient expects to be discharged to:: Unsure Living Arrangements: Alone Available Help at Discharge: Family;Personal care attendant (pt cannot state how often aide is there, "sometimes") Type of Home: House   Entrance Stairs-Rails: Right;Left Entrance Stairs-Number of Steps: 4 Home Layout: Two level;Able to live on main level with bedroom/bathroom Home Equipment: Gilford Rile - 2 wheels;Bedside commode;Cane - single point      Prior Function Level of Independence: Independent with assistive device(s)         Comments: RW as needed     Hand Dominance        Extremity/Trunk Assessment   Upper Extremity Assessment Upper Extremity Assessment: Generalized weakness  Lower Extremity Assessment Lower Extremity Assessment: Generalized weakness       Communication   Communication: No difficulties  Cognition Arousal/Alertness: Awake/alert Behavior During Therapy: Flat affect Overall Cognitive Status: Impaired/Different from baseline Area of Impairment: Problem solving                             Problem Solving: Slow processing;Decreased  initiation;Requires verbal cues;Requires tactile cues General Comments: reluctant to answer questions, requires incr time to complete basic tasks      General Comments      Exercises     Assessment/Plan    PT Assessment Patient needs continued PT services  PT Problem List Decreased strength;Decreased mobility;Decreased activity tolerance;Decreased balance;Decreased knowledge of use of DME;Pain       PT Treatment Interventions DME instruction;Therapeutic activities;Gait training;Functional mobility training;Therapeutic exercise;Patient/family education;Balance training    PT Goals (Current goals can be found in the Care Plan section)  Acute Rehab PT Goals Patient Stated Goal: none stated PT Goal Formulation: With patient Time For Goal Achievement: 08/07/21 Potential to Achieve Goals: Good    Frequency Min 3X/week   Barriers to discharge        Co-evaluation               AM-PAC PT "6 Clicks" Mobility  Outcome Measure Help needed turning from your back to your side while in a flat bed without using bedrails?: A Lot Help needed moving from lying on your back to sitting on the side of a flat bed without using bedrails?: A Little Help needed moving to and from a bed to a chair (including a wheelchair)?: A Lot Help needed standing up from a chair using your arms (e.g., wheelchair or bedside chair)?: A Lot Help needed to walk in hospital room?: A Lot Help needed climbing 3-5 steps with a railing? : Total 6 Click Score: 12    End of Session   Activity Tolerance: Patient limited by fatigue Patient left: in bed;with call bell/phone within reach;with bed alarm set Nurse Communication: Mobility status PT Visit Diagnosis: Other abnormalities of gait and mobility (R26.89);Muscle weakness (generalized) (M62.81)    Time: 1010-1030 PT Time Calculation (min) (ACUTE ONLY): 20 min   Charges:   PT Evaluation $PT Eval Low Complexity: [place], PT  Acute  Rehab Dept ([facility]) [phone] Pager [phone]  07/24/2021   [facility] 07/24/2021, 10:54 AM

## 2021-07-24 NOTE — Progress Notes (Signed)
Pt has had 3 large bloody bowel movements consisting of only bright red blood. No stool observed. MD notified. Family present at bedside.   New orders placed for CBC, INR, occult blood, and discontinued eliquis. Eliquis given per MD order before being discontinued.

## 2021-07-24 NOTE — Progress Notes (Signed)
Pt had two more instances of large, bright red bloody bowel movements, altogether, 6 starting at 0700 07/24/21. MD at bedside to assess patient. Patient's extremities were very cold upon palpation.   Vitals checked at 1418 BP: 84/51 HR: 102 RR: 10 Sp02: 94 Temperature:unable to be assessed. 3 thermometers tried. Oral and axillary routes attempted Orientation: alert and oriented x4  MD notified of vitals and put in new orders for normal saline to go at 125 ml/hr, STAT Kcentra (medication from pharmacy was not available before transfer), and STAT transfer to ICU. Family notified of transfer.    AC called rapid nurse to bedside. Rapid monitored patient before and during transfer.  Report given at bedside in ICU and informed nurse taking the patient to give [place] STAT.

## 2021-07-24 NOTE — Progress Notes (Signed)
K centra has been infused for about 1 hour now. I am planning bedside EGD tonight. Endo team has been called.

## 2021-07-24 NOTE — Op Note (Addendum)
The [facility] endoscopy procedure writing software malfunctioned prior to the case and afterwards and so this will have to serve as the official endoscopy note.   EGD performed bedside in the ICU after informed consent obtained. Patient was sedated with 125 mcg of fentanyl, 7 mg of Versed, 0.5 mg of glucagon was used, 7 m milliliters of dilute epi were infused.   Indications: Overt GI bleeding, hypotension 4 days after ERCP with biliary sphincterotomy, Eliquis was started 1 day after the sphincterotomy and he received a dose this morning.  Findings: Using adult gastroscope There was a small to medium amount of solid food in his stomach as well as some blood clot. The previous sphincterotomy site was noted and there was slight oozing from what appeared to be a visible vessel on the inferior aspect of the sphincterotomy.  Visualization was never ideal however I treated the bleeding site with initial injections of dilute epinephrine and then Endo Clip placement.  1 Endo Clip misfired and the other seem to be in good position at the visible vessel.  The misfired clip remained in wide open position and I was concerned that it might cause trauma as it tumbled down through the bowel and so I removed with a Roth net.  By the end of the procedure there was hemostasis.  Impression: His overt bleeding has been from post biliary sphincterotomy bleeding.  A visible vessel was treated with dilute epinephrine injection as well as Endo Clip placement.  I hope that this therapy will result in permanent hemostasis.  Certainly reversing his Eliquis today with Eppie Gibson will help as well.  If he has significant active rebleeding interventional radiology techniques might be needed to stop the bleeding, could use the Endo Clip as a target in that case.  Recommendation: Remain on IV PPI drip N.p.o. overnight but if he does well then clears can be started tomorrow Watch for active recurrent bleeding, trend blood counts and  transfuse if needed.

## 2021-07-24 NOTE — Consult Note (Signed)
NAME:  [name] [name], MRN:  [id number], DOB:  07-08-1951, LOS: 6 ADMISSION DATE:  07/17/2021, CONSULTATION DATE: 07/24/2021 REFERRING MD: Barrett Henle, CHIEF COMPLAINT: Bloody stool  History of Present Illness:  70 year old man with history of CHF EF 25% presented with abdominal pain found to have cholecystitis status post ERCP, deferred cholecystectomy given surgical risk finished course of antibiotics 9/2 who developed diffuse diarrhea 9/1 tested positive for C. difficile 9/3 who transferred to the ICU in the setting of hypotension and bright red blood per rectum.  Overall, patient feels fine.  Denies feeling better in the last couple days.  Denies shortness of breath..  He feels thirsty. reviewed vital signs of development of worsening tachycardia albeit mild throughout the last 24 hours.  Blood pressure systolics [id number] to 0000000 down from more normal values recently.  His hemoglobin is down 2 points over the last couple of days.  His weight is down a total of over 30 pounds compared to admission.  He is been aggressively diuresed.  Still with about 200 cc of frank red or maroon stool.  Feels need to urinate.  Pertinent  Medical History  CHF likely component of infiltrative disease, EF 25%  Significant Hospital Events: Including procedures, antibiotic start and stop dates in addition to other pertinent events   Admitted 8/28 with acute cholecystitis placed on antibiotics 8/30 ERCP 9/1 had diarrhea 9/2 completed course of Augmentin 9/3 tested positive for C. Difficile 9/4 bright red blood per rectum, hypotension, transferred to stepdown   Interim History / Subjective:  N/A  Objective   Blood pressure (!) 84/51, pulse (!) 102, temperature 98.6 F (37 C), resp. rate 10, height '6\' 1"'$  (1.854 m), weight 85.3 kg, SpO2 94 %.        Intake/Output Summary (Last 24 hours) at 07/24/2021 1531 Last data filed at 07/24/2021 1300 Gross per 24 hour  Intake 160 ml  Output --  Net 160 ml   Filed Weights    07/22/21 0500 07/23/21 0500 07/24/21 0500  Weight: 93.1 kg 88.9 kg 85.3 kg    Examination: General: Lying in bed, in no acute distress Eyes: EOMI, icterus Neck: Supple, no JVD P appreciated with head of bed at 30 degrees Cardiovascular: Tachycardia, no murmur Pulmonary: Clear, normal work of breathing on room air Extremities: Warm, no edema Neuro: No focal deficits, sensation intact Psych: Normal mood, full affect  Resolved Hospital Problem list   Cholecystitis status post ERCP, no cholecystectomy, status post antibiotic course  Assessment & Plan:  Hypotension: In the setting of increased GI losses due to C. difficile worsened by intravascular losses from lower GI bleed likely from colitis.  He appears dry on exam with dry mucous membranes and increase skin turgor.  He has tachycardia new from prior.  All signs point to hypovolemia exacerbated by aggressive diuresis which was appropriate in the preceding days.  Given underlying cardiomyopathy, advise cautious fluid boluses. --LR 500 cc bolus x1, assess response and repeat as needed --Peripheral norepinephrine as needed  Lower GI bleed: Bright red blood/maroon stools.  Has had a C. difficile.  Suspect related to colitis.  Exacerbated by anticoagulation. --Eppie Gibson ordered --Hold Eliquis  Best Practice (right click and "Reselect all SmartList Selections" daily)   Per primary  Labs   CBC: Recent Labs  Lab 07/17/21 1848 07/18/21 0210 07/19/21 0513 07/20/21 0547 07/22/21 0538 07/23/21 2102 07/24/21 0518 07/24/21 1429  WBC 16.5*   < > 12.3* 8.8 9.8 14.1* 13.6* 17.5*  NEUTROABS 14.5*  --  10.5* 7.8*  --  12.2*  --   --   HGB 16.8   < > 16.1 15.9 16.2 13.3 12.3* 11.5*  HCT 53.2*   < > 50.7 49.5 49.5 42.0 38.9* 37.2*  MCV 82.2   < > 80.1 80.9 78.0* 80.3 81.0 83.8  PLT 88*   < > 86* 106* 130* 112* 112* 116*   < > = values in this interval not displayed.    Basic Metabolic Panel: Recent Labs  Lab 07/19/21 0513  07/20/21 0547 07/21/21 0541 07/22/21 0538 07/23/21 0555 07/24/21 0518  NA 135 135 139 138 140 139  K 4.2 4.6 3.8 3.6 4.2 4.2  CL 101 99 100 98 104 102  CO2 '25 27 28 26 26 28  '$ GLUCOSE 121* 176* 149* 156* 149* 131*  BUN 40* 47* 53* 69* 78* 76*  CREATININE 2.12* 2.47* 2.65* 2.30* 2.05* 2.10*  CALCIUM 9.3 9.5 9.6 9.3 9.2 8.4*  MG 2.2 2.2 2.2 1.9 2.1  --    GFR: Estimated Creatinine Clearance: 38 mL/min (A) (by C-G formula based on SCr of 2.1 mg/dL (H)). Recent Labs  Lab 07/20/21 1354 07/21/21 0541 07/22/21 0538 07/23/21 2102 07/24/21 0518 07/24/21 1429  WBC  --   --  9.8 14.1* 13.6* 17.5*  LATICACIDVEN 2.0* 1.5  --   --   --   --     Liver Function Tests: Recent Labs  Lab 07/17/21 1848 07/18/21 0210 07/19/21 0513 07/20/21 0547  AST 51* 49* 53* 57*  ALT '23 21 24 24  '$ ALKPHOS 171* 169* 210* 277*  BILITOT 4.1* 3.7* 3.4* 3.2*  PROT 7.0 6.4* 6.4* 6.4*  ALBUMIN 3.3* 3.1* 2.8* 2.7*   Recent Labs  Lab 07/17/21 1848 07/19/21 0513 07/20/21 0547  LIPASE 105* 40 63*   No results for input(s): AMMONIA in the last 168 hours.  ABG    Component Value Date/Time   TCO2 25 02/09/2011 2257     Coagulation Profile: Recent Labs  Lab 07/24/21 1429  INR 2.4*    Cardiac Enzymes: No results for input(s): CKTOTAL, CKMB, CKMBINDEX, TROPONINI in the last 168 hours.  HbA1C: Hgb A1c MFr Bld  Date/Time Value Ref Range Status  07/18/2021 02:10 AM 6.6 (H) 4.8 - 5.6 % Final    Comment:    (NOTE) Pre diabetes:          5.7%-6.4%  Diabetes:              >6.4%  Glycemic control for   <7.0% adults with diabetes   05/04/2021 04:32 AM 6.6 (H) 4.8 - 5.6 % Final    Comment:    (NOTE)         Prediabetes: 5.7 - 6.4         Diabetes: >6.4         Glycemic control for adults with diabetes: <7.0     CBG: Recent Labs  Lab 07/23/21 1245 07/23/21 1707 07/23/21 2339 07/24/21 0529 07/24/21 1145  GLUCAP 187* 158* 100* 150* 168*    Review of Systems:   Denies orthopnea or  PND.  No lower extremity swelling.  Comprehensive review of systems otherwise negative.  Past Medical History:  He,  has a past medical history of Acute on chronic congestive heart failure ([place]), Acute on chronic systolic CHF (congestive heart failure) ([place]) (05/04/2021), Arthritis of left knee (12/02/2020), Bilateral leg edema (05/04/2017), CAD (coronary artery disease), CAD (coronary artery disease), native coronary artery (11/12/2018), CAD, NATIVE VESSEL (06/15/2009), CAP (community acquired  pneumonia) (08/17/2016), Depressive disorder (11/12/2018), Diabetes mellitus due to underlying condition with unspecified complications ([place]) (0000000), DM (diabetes mellitus) ([place]), 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), Erythrocytosis (06/01/2020), Essential hypertension (10/14/2007), Gout, unspecified (10/14/2007), History of colonic polyps (11/12/2018), History of kidney stones, History of nephrolithiasis (11/12/2018), History of smoking (11/12/2018), HTN (hypertension), Hyperlipidemia, HYPERTENSION, BENIGN (06/15/2009), Inguinal hernia without mention of obstruction or gangrene, recurrent unilateral or unspecified (11/12/2018), Ischemic cardiomyopathy (08/12/2020), Medication intolerance (11/12/2018), Mixed hyperlipidemia (11/12/2018), Old myocardial infarction (11/12/2018), Paroxysmal atrial fibrillation ([place]) (07/16/2020), Pre-operative cardiovascular examination (11/12/2018), Primary osteoarthritis of both knees (03/05/2017), Renal insufficiency (08/12/2020), Right carotid bruit (08/05/2014), 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   Surgical History:   Past Surgical History:  Procedure Laterality Date   BILATERAL KNEE ARTHROSCOPY      CORONARY ANGIOPLASTY WITH STENT PLACEMENT     ERCP N/A 07/19/2021   Procedure: ENDOSCOPIC RETROGRADE CHOLANGIOPANCREATOGRAPHY (ERCP);  Surgeon: Carol Ada, MD;  Location: [facility];  Service: Endoscopy;  Laterality: N/A;   LAPAROSCOPIC INGUINAL HERNIA REPAIR     LEFT HEART CATH AND CORONARY ANGIOGRAPHY N/A 05/05/2021   Procedure: LEFT HEART CATH AND CORONARY ANGIOGRAPHY;  Surgeon: Troy Sine, MD;  Location: [facility];  Service: Cardiovascular;  Laterality: N/A;   NOSE SURGERY     REMOVAL OF STONES  07/19/2021   Procedure: REMOVAL OF STONES;  Surgeon: Carol Ada, MD;  Location: [facility];  Service: Endoscopy;;   SPHINCTEROTOMY  07/19/2021   Procedure: Joan Mayans;  Surgeon: Carol Ada, MD;  Location: [facility];  Service: Endoscopy;;   TOTAL KNEE ARTHROPLASTY Right 11/15/2018   Procedure: RIGHT TOTAL KNEE ARTHROPLASTY;  Surgeon: Mcarthur Rossetti, MD;  Location: [facility];  Service: Orthopedics;  Laterality: Right;   TOTAL KNEE ARTHROPLASTY Left 12/03/2020   Procedure: LEFT TOTAL KNEE ARTHROPLASTY;  Surgeon: Mcarthur Rossetti, MD;  Location: [facility];  Service: Orthopedics;  Laterality: Left;     Social History:   reports that he quit smoking about 32 years ago. His smoking use included cigarettes. He has a 5.00 pack-year smoking history. He quit smokeless tobacco use about 52 years ago.  His smokeless tobacco use included snuff and chew. He reports that he does not drink alcohol and does not use drugs.   Family History:  His family history includes Cancer in his mother; Diabetes in his father; Heart attack in his father; Hypertension in his brother, father, and mother; Irregular heart beat in his brother.   Allergies Allergies  Allergen Reactions   Allopurinol Other (See Comments)    Felt terrible   Aripiprazole Other (See Comments)    Felt terrible   Bupropion Other (See Comments)    Felt bad   Buspirone Other (See Comments)    Felt bad    Desvenlafaxine Other (See Comments)    Made him hyper   Duloxetine Other (See Comments)    Knocked him out   Escitalopram Other (See Comments)    Felt like crap   Fish Oil     unknown   Fluticasone Furoate Other (See Comments)    Unknown    Hydroxyzine Hcl Other (See Comments)    Knocked him out   Nortriptyline Other (See Comments)    Ineffective   Paroxetine Hcl Other (See Comments)    unknown   Ramipril Cough  Sertraline Other (See Comments)    "felt plugged in"   Venlafaxine Other (See Comments)    Felt bad   Vilazodone Other (See Comments)    "felt weird"     Home Medications  Prior to Admission medications   Medication Sig Start Date End Date Taking? Authorizing Provider  ALPRAZolam Duanne Moron) 0.25 MG tablet Take 0.25 mg by mouth at bedtime as needed for anxiety.  11/27/16  Yes [provider]  apixaban (ELIQUIS) 5 MG TABS tablet Take 1 tablet (5 mg total) by mouth 2 (two) times daily. 05/06/21 07/18/21 Yes Elodia Florence., MD  colchicine 0.6 MG tablet Take 0.6 mg by mouth daily as needed for other. Gout flare up 05/02/21  Yes [provider]  cyanocobalamin (,VITAMIN B-12,) 1000 MCG/ML injection Inject 1,000 mcg into the muscle every 30 (thirty) days.  05/12/16  Yes [provider]  dapagliflozin propanediol (FARXIGA) 10 MG TABS tablet Take 1 tablet (10 mg total) by mouth at bedtime. 05/11/21  Yes Katherine Roan, MD  furosemide (LASIX) 40 MG tablet Take 1 tablet (40 mg total) by mouth daily. 05/30/21  Yes Katherine Roan, MD  LANTUS SOLOSTAR 100 UNIT/ML Solostar Pen Inject 18 Units into the skin at bedtime. 03/14/20  Yes [provider]  levalbuterol (XOPENEX HFA) 45 MCG/ACT inhaler Inhale 1 puff into the lungs every 4 (four) hours as needed for wheezing.   Yes [provider]  potassium chloride SA (KLOR-CON) 20 MEQ tablet Take 1 tablet (20 mEq total) by mouth daily. 05/30/21  Yes Katherine Roan, MD  rosuvastatin (CRESTOR)  40 MG tablet Take 1 tablet (40 mg total) by mouth at bedtime. 05/30/21  Yes Katherine Roan, MD  spironolactone (ALDACTONE) 25 MG tablet Take 1 tablet (25 mg total) by mouth at bedtime. 07/04/21  Yes Bensimhon, Shaune Pascal, MD  traMADol (ULTRAM) 50 MG tablet Take 25 mg by mouth every 4 (four) hours as needed for pain or severe pain. 11/13/20  Yes [provider]  albuterol (VENTOLIN HFA) 108 (90 Base) MCG/ACT inhaler Inhale 2 puffs into the lungs every 6 (six) hours as needed for shortness of breath. 05/03/21   [provider]  CIALIS 20 MG tablet Take 20 mg by mouth daily as needed for erectile dysfunction.  Patient not taking: Reported on 07/18/2021 02/06/17   [provider]  fluticasone (FLONASE) 50 MCG/ACT nasal spray Place 1 spray into both nostrils daily as needed for allergies or rhinitis.    [provider]     Critical care time:     CRITICAL CARE Performed by: Lanier Clam   Total critical care time: 40 minutes  Critical care time was exclusive of separately billable procedures and treating other patients.  Critical care was necessary to treat or prevent imminent or life-threatening deterioration.  Critical care was time spent personally by me on the following activities: development of treatment plan with patient and/or surrogate as well as nursing, discussions with consultants, evaluation of patient's response to treatment, examination of patient, obtaining history from patient or surrogate, ordering and performing treatments and interventions, ordering and review of laboratory studies, ordering and review of radiographic studies, pulse oximetry and re-evaluation of patient's condition.

## 2021-07-24 NOTE — Progress Notes (Signed)
Blood pressure initially improved with 500 cc bolus LR.  Heart rate mildly improved as well.  Blood pressure dropped down recently.  Nor epi started peripherally at 2.  Another 500 cc LR bolus ordered.  Sounds like plan EGD this evening.

## 2021-07-24 NOTE — Significant Event (Signed)
Rapid Response Event Note   Reason for Call :  Called to bedside by [facility], reported pt was hypotensive GI bleed  Initial Focused Assessment:  Bedside RN reported pt had multiple bloody bowel movements, attending MD aware. Pt was alert and pale w/ cool extremities, BP 84/51 & O2 100% on RA.   Interventions:  CBC already collected on arrival, eliquis reversal agent being prepped by pharmacy. Pt placed on monitor and transferred to ICU.   Plan of Care:  Transfer pt to ICU  Event Summary:   MD Notified: Dr. Darrick Meigs at bedside Call Time: [place] Time: [place]  Cyndie Chime, RN

## 2021-07-24 NOTE — Progress Notes (Signed)
[facility] Progress Note Covering for Drs. Mann/Hung this weekend    Since last GI note: He underwent an ERCP with Dr. Benson Norway 8/30 with biliary sphincterotomy, balloon sweeping to remove CBD stones.  Cholecystectomy was recommended against after cardiac evaluation. His usual eliquis was restarted 8/31, he's been getting it BID including a dose this morning. Started having overt red rectal bleeding this afternoon. Labs show Hb 13.3 yesterday and 11.5 this afternoon.  He was given Kcentra and transferred to ICU.  In ICU he is laying in bed, eating a jello.  He had another episode of BRBPR in the commode just before I can in the room.  SBPs 80-90, HR 90s  He is being started on pressor.  K Centra just completed infusing while I was in the room.  Objective: Vital signs in last 24 hours: Pulse Rate:  [84-103] 102 (09/04 1418) Resp:  [10-20] 10 (09/04 1418) BP: (84-104)/(51-73) 84/51 (09/04 1418) SpO2:  [94 %-96 %] 94 % (09/04 1418) Weight:  [85.3 kg] 85.3 kg (09/04 0500) Last BM Date: 07/24/21 General: alert and oriented times 3, a bit groggy Heart: regular rate and rythm Abdomen: soft, non-tender, non-distended, normal bowel sounds  Lab Results: Recent Labs    07/23/21 2102 07/24/21 0518 07/24/21 1429  WBC 14.1* 13.6* 17.5*  HGB 13.3 12.3* 11.5*  PLT 112* 112* 116*  MCV 80.3 81.0 83.8   Recent Labs    07/22/21 0538 07/23/21 0555 07/24/21 0518  NA 138 140 139  K 3.6 4.2 4.2  CL 98 104 102  CO2 '26 26 28  '$ GLUCOSE 156* 149* 131*  BUN 69* 78* 76*  CREATININE 2.30* 2.05* 2.10*  CALCIUM 9.3 9.2 8.4*   No results for input(s): PROT, ALBUMIN, AST, ALT, ALKPHOS, BILITOT, BILIDIR, IBILI in the last 72 hours. Recent Labs    07/24/21 1429  INR 2.4*   Medications: Scheduled Meds:  Chlorhexidine Gluconate Cloth  6 each Topical Daily   famotidine  20 mg Oral Daily   [START ON 07/28/2021] pantoprazole  40 mg Intravenous Q12H   rosuvastatin  40 mg Oral QHS    vancomycin  125 mg Oral QID   Continuous Infusions:  sodium chloride 125 mL/hr at 07/24/21 1443   sodium chloride     metronidazole     norepinephrine (LEVOPHED) Adult infusion     pantoprazole     pantoprazole     prothrombin complex conc human (Kcentra) IVPB 4,230 Units (07/24/21 1525)   PRN Meds:.acetaminophen **OR** acetaminophen, alum & mag hydroxide-simeth, HYDROmorphone (DILAUDID) injection, insulin aspart, ondansetron (ZOFRAN) IV  Assessment/Plan: 70 y.o. male with GI bleeding very possibly from his recent biliary sphincterotomy.  He was restarted on eliquis BID the day after his ERCP and had his first dose already this morning. Hopefully this will allow his bleeding to stop without requiring intervention. Given such recent dosage of his blood thinner, stopping any bleeding would be quite difficult for a while until the K centra has had effect to neutralize the eliquis. I ordered IV PPI bolus and drip as well.   He just finished eating a cup of jello when I walked in the room, I made him NPO for now in case he required EGD.  Will follow along closely.  Please let me know about any clinical deterioration.  Milus Banister, MD  07/24/2021, 3:39 PM [facility] Pager [phone]

## 2021-07-24 NOTE — Progress Notes (Addendum)
Abrasion of.  Lipid repeat evaluation [facility]  PROGRESS NOTE  [name] [name] [id number] DOB: 05/25/1951 DOA: 07/17/2021 PCP: Raeanne Gathers, MD   Brief HPI:   70 year old male with history of diabetes mellitus type 2, chronic systolic CHF, CKD stage III, paroxysmal atrial fibrillation, CAD, hypertension, osteoarthritis, patient presented with abdominal pain and diarrhea.  On presentation was found to have acute cholecystitis with choledocholithiasis and mild pancreatitis along with leukocytosis elevated LFTs.  He was started on broad-spectrum antibiotics.  GI and general surgery were consulted.  Patient was supposed to get cholecystectomy however surgery was canceled as cardiology did not recommend patient to undergo surgery due to risk involved with surgery.  Subjective   Called by RN that patient started having bloody stools.  Patient seen and examined, mentally he is alert, having large frank bloody stools.  He is on Eliquis for atrial fibrillation.  He did receive his morning dose of Eliquis today.   Assessment/Plan:    Lower GI bleed in setting of C. difficile colitis -Patient is on Eliquis for anticoagulation for atrial fibrillation -We will discontinue Eliquis, discussed with cardiology -Obtain stat CBC, PT/INR -We will reverse the Eliquis effect by giving IV Kcentra -Gastroenterology has been consulted and will soon see the patient -We will start IV normal saline at 125 mill per hour for 4 hours; patient has history of CHF -We will transfer to ICU -Start Levophed infusion via peripheral IV, discussed with PCCM   C. difficile diarrhea -Patient developed diarrhea since 07/21/2021 after starting Augmentin -Since he did not have any symptoms of abdominal pain fever or leukocytosis, he was given 1 dose of Imodium with some improvement -Diarrhea restarted with multiple episodes of loose stools -Called and discussed with ID, though patient did not have  symptoms but had recent antibiotic use so he is at high risk for developing C. difficile infection -C. difficile PCR obtained, which turned out to be positive --Patient started on p.o. vancomycin 125 mg 4 times daily -We will start IV Flagyl 500 mg every 8 hours  Acute calculus cholecystitis/choledocholithiasis/biliary pancreatitis -Patient underwent ERCP which showed choledocholithiasis, underwent stone removal with sphincterotomy -Cholecystectomy was scheduled for this morning however it was canceled after cardiology recommended against it -General surgery has signed off and will follow patient in their office -Patient received 3 days of IV Zosyn, IV Zosyn was discontinued and patient started on Augmentin 1 tablet p.o. twice daily for 2 more days.  Antibiotic treatment was completed on  07/22/2021.  Acute on chronic combined systolic and diastolic heart failure -Patient has EF of 20 to 25%, severe RV dysfunction, findings concerning for amyloid -Cardiology has ordered PYP scan to evaluate for amyloid, SPEP/UPEP/light chains -Started on IV Lasix for fluid overload, Lasix currently on hold due to worsening renal function -Cardiology discontinued IV Lasix and started on p.o. Lasix -P.o. Lasix is currently on hold due to ongoing diarrhea.  Thrombocytopenia -Resolved -Likely from underlying cholecystitis  Paroxysmal atrial fibrillation -Eliquis was restarted, will hold Eliquis at this time because of GI bleed as above. -Beta-blocker on hold considering CHF as above  CAD s/p NSTEMI in 2008 with stent to RCA and LAD -Continue statin, aspirin  Diabetes mellitus type 2 -[place] is currently on hold due to acute kidney injury -Continue sliding scale insulin with NovoLog   Acute kidney injury on CKD stage IIIb -Patient baseline creatinine is around 1.8 -Presented with creatinine of 2.02, which has worsened to 2.65 yesterday.  Creatinine has improved to 2.02 after  holding Lasix.  Creatinine  likely at baseline -Likely from CHF exacerbation, also patient became hypotensive yesterday during ERCP requiring pressor support -He was started on scheduled IV Lasix 40 mg IV every 12 hours.  IV Lasix is currently on hold for worsening renal function.    Scheduled medications:    famotidine  20 mg Oral Daily   rosuvastatin  40 mg Oral QHS   vancomycin  125 mg Oral QID         Data Reviewed:   CBG:  Recent Labs  Lab 07/23/21 1245 07/23/21 1707 07/23/21 2339 07/24/21 0529 07/24/21 1145  GLUCAP 187* 158* 100* 150* 168*    SpO2: 96 % O2 Flow Rate (L/min): 2 L/min    Vitals:   07/23/21 1900 07/24/21 0500 07/24/21 0523 07/24/21 1203  BP: 100/73  101/73 104/67  Pulse: 84  93 (!) 103  Resp: '20  18 17  '$ Temp:      TempSrc:      SpO2: 96%  94% 96%  Weight:  85.3 kg    Height:         Intake/Output Summary (Last 24 hours) at 07/24/2021 1417 Last data filed at 07/24/2021 1300 Gross per 24 hour  Intake 160 ml  Output --  Net 160 ml    09/02 1901 - 09/04 0700 In: 210.1 [I.V.:160] Out: 1275 [Urine:1275]  Filed Weights   07/22/21 0500 07/23/21 0500 07/24/21 0500  Weight: 93.1 kg 88.9 kg 85.3 kg    CBC:  Recent Labs  Lab 07/17/21 1848 07/18/21 0210 07/19/21 0513 07/20/21 0547 07/22/21 0538 07/23/21 2102 07/24/21 0518  WBC 16.5*   < > 12.3* 8.8 9.8 14.1* 13.6*  HGB 16.8   < > 16.1 15.9 16.2 13.3 12.3*  HCT 53.2*   < > 50.7 49.5 49.5 42.0 38.9*  PLT 88*   < > 86* 106* 130* 112* 112*  MCV 82.2   < > 80.1 80.9 78.0* 80.3 81.0  MCH 26.0   < > 25.4* 26.0 25.5* 25.4* 25.6*  MCHC 31.6   < > 31.8 32.1 32.7 31.7 31.6  RDW 21.2*   < > 21.6* 21.6* 21.3* 21.1* 21.2*  LYMPHSABS 0.4*  --  0.5* 0.4*  --  1.0  --   MONOABS 1.4*  --  1.2* 0.5  --  0.8  --   EOSABS 0.0  --  0.0 0.0  --  0.0  --   BASOSABS 0.0  --  0.0 0.0  --  0.0  --    < > = values in this interval not displayed.    Complete metabolic panel:  Recent Labs  Lab 07/17/21 1848 07/18/21 0210  07/19/21 0513 07/20/21 0547 07/20/21 1354 07/21/21 0541 07/22/21 0538 07/23/21 0555 07/24/21 0518  NA 133* 133* 135 135  --  139 138 140 139  K 4.2 4.0 4.2 4.6  --  3.8 3.6 4.2 4.2  CL 96* 96* 101 99  --  100 98 104 102  CO2 '25 24 25 27  '$ --  '28 26 26 28  '$ GLUCOSE 128* 98 121* 176*  --  149* 156* 149* 131*  BUN 44* 43* 40* 47*  --  53* 69* 78* 76*  CREATININE 2.00* 2.01* 2.12* 2.47*  --  2.65* 2.30* 2.05* 2.10*  CALCIUM 9.4 9.3 9.3 9.5  --  9.6 9.3 9.2 8.4*  AST 51* 49* 53* 57*  --   --   --   --   --   ALT  $'23 21 24 24  'X$ --   --   --   --   --   ALKPHOS 171* 169* 210* 277*  --   --   --   --   --   BILITOT 4.1* 3.7* 3.4* 3.2*  --   --   --   --   --   ALBUMIN 3.3* 3.1* 2.8* 2.7*  --   --   --   --   --   MG  --   --  2.2 2.2  --  2.2 1.9 2.1  --   LATICACIDVEN  --   --   --   --  2.0* 1.5  --   --   --   HGBA1C  --  6.6*  --   --   --   --   --   --   --   BNP  --   --   --   --   --  880.7*  --   --   --     Recent Labs  Lab 07/17/21 1848 07/19/21 0513 07/20/21 0547  LIPASE 105* 40 63*    Recent Labs  Lab 07/17/21 2001 07/21/21 0541  BNP  --  880.7*  SARSCOV2NAA NEGATIVE  --     ------------------------------------------------------------------------------------------------------------------ No results for input(s): CHOL, HDL, LDLCALC, TRIG, CHOLHDL, LDLDIRECT in the last 72 hours.  Lab Results  Component Value Date   HGBA1C 6.6 (H) 07/18/2021   ------------------------------------------------------------------------------------------------------------------ No results for input(s): TSH, T4TOTAL, T3FREE, THYROIDAB in the last 72 hours.  Invalid input(s): FREET3 ------------------------------------------------------------------------------------------------------------------ No results for input(s): VITAMINB12, FOLATE, FERRITIN, TIBC, IRON, RETICCTPCT in the last 72 hours.  Coagulation profile No results for input(s): INR, PROTIME in the last 168 hours. No  results for input(s): DDIMER in the last 72 hours.  Cardiac Enzymes No results for input(s): CKTOTAL, CKMB, CKMBINDEX, TROPONINI in the last 168 hours.  ------------------------------------------------------------------------------------------------------------------    Component Value Date/Time   BNP 880.7 (H) 07/21/2021 0541     Antibiotics: Anti-infectives (From admission, onward)    Start     Dose/Rate Route Frequency Ordered Stop   07/24/21 1515  metroNIDAZOLE (FLAGYL) IVPB 500 mg        500 mg 100 mL/hr over 60 Minutes Intravenous Every 8 hours 07/24/21 1416     07/23/21 1500  vancomycin (VANCOCIN) capsule 125 mg        125 mg Oral 4 times daily 07/23/21 1358 08/02/21 1359   07/21/21 1400  amoxicillin-clavulanate (AUGMENTIN) 875-125 MG per tablet 1 tablet        1 tablet Oral Every 12 hours 07/21/21 1057 07/22/21 2224   07/18/21 0800  piperacillin-tazobactam (ZOSYN) IVPB 3.375 g  Status:  Discontinued        3.375 g 12.5 mL/hr over 240 Minutes Intravenous Every 8 hours 07/18/21 0142 07/21/21 1057   07/18/21 0015  piperacillin-tazobactam (ZOSYN) IVPB 3.375 g        3.375 g 100 mL/hr over 30 Minutes Intravenous  Once 07/18/21 0007 07/18/21 0129        Radiology Reports  CT HEAD WO CONTRAST (5MM)  Result Date: 07/22/2021 CLINICAL DATA:  Mental status change.  Unknown cause. EXAM: CT HEAD WITHOUT CONTRAST TECHNIQUE: Contiguous axial images were obtained from the base of the skull through the vertex without intravenous contrast. COMPARISON:  CT head 11/04/2018. FINDINGS: Brain: There is no evidence of acute intracranial hemorrhage, mass lesion, brain edema or extra-axial fluid collection. Stable mild atrophy  with prominence of the ventricles and subarachnoid spaces. There are mild chronic small vessel ischemic changes in the periventricular white matter. There is no CT evidence of acute cortical infarction. Vascular: Intracranial vascular calcifications. No hyperdense vessel  identified. Skull: Negative for fracture or focal lesion. Sinuses/Orbits: The visualized paranasal sinuses and mastoid air cells are clear. No orbital abnormalities are seen. Other: None. IMPRESSION: 1. Stable head CT without acute intracranial findings. 2. Mild atrophy and chronic small vessel ischemic changes. Electronically Signed   By: Richardean Sale M.D.   On: 07/22/2021 17:53      DVT prophylaxis: SCDs  Code Status: Full code  Family Communication: No family at bedside   Consultants: Gastroenterology General surgery  Procedures:     Objective    Physical Examination:  General-appears in no acute distress Heart-S1-S2, regular, no murmur auscultated Lungs-clear to auscultation bilaterally, no wheezing or crackles auscultated Abdomen-soft, mild abdominal tenderness to palpation, no organomegaly Extremities-no edema in the lower extremities Neuro-alert, oriented x3, no focal deficit noted  Status is: Inpatient  Dispo: The patient is from: Home              Anticipated d/c is to: Home              Anticipated d/c date is: 07/30/2021              Patient currently not stable for discharge  Barrier to discharge-worsening renal function, C. difficile diarrhea  COVID-19 Labs  No results for input(s): DDIMER, FERRITIN, LDH, CRP in the last 72 hours.  Lab Results  Component Value Date   [place] NEGATIVE 07/17/2021   [place] NEGATIVE 05/04/2021   [place] NEGATIVE 11/30/2020    Microbiology  Recent Results (from the past 240 hour(s))  Resp Panel by RT-PCR (Flu A&B, Covid) Nasopharyngeal Swab     Status: None   Collection Time: 07/17/21  8:01 PM   Specimen: Nasopharyngeal Swab; Nasopharyngeal(NP) swabs in vial transport medium  Result Value Ref Range Status   SARS Coronavirus 2 by RT PCR NEGATIVE NEGATIVE Final    Comment: (NOTE) SARS-CoV-2 target nucleic acids are NOT DETECTED.  The SARS-CoV-2 RNA is generally detectable in upper  respiratory specimens during the acute phase of infection. The lowest concentration of SARS-CoV-2 viral copies this assay can detect is 138 copies/mL. A negative result does not preclude SARS-Cov-2 infection and should not be used as the sole basis for treatment or other patient management decisions. A negative result may occur with  improper specimen collection/handling, submission of specimen other than nasopharyngeal swab, presence of viral mutation(s) within the areas targeted by this assay, and inadequate number of viral copies(<138 copies/mL). A negative result must be combined with clinical observations, patient history, and epidemiological information. The expected result is Negative.  Fact Sheet for Patients:  EntrepreneurPulse.com.au  Fact Sheet for Healthcare Providers:  IncredibleEmployment.be  This test is no t yet approved or cleared by the [place] FDA and  has been authorized for detection and/or diagnosis of SARS-CoV-2 by FDA under an Emergency Use Authorization (EUA). This EUA will remain  in effect (meaning this test can be used) for the duration of the COVID-19 declaration under Section 564(b)(1) of the Act, 21 U.S.C.section 360bbb-3(b)(1), unless the authorization is terminated  or revoked sooner.       Influenza A by PCR NEGATIVE NEGATIVE Final   Influenza B by PCR NEGATIVE NEGATIVE Final    Comment: (NOTE) The Xpert Xpress SARS-CoV-2/FLU/RSV plus assay is intended as an aid  in the diagnosis of influenza from Nasopharyngeal swab specimens and should not be used as a sole basis for treatment. Nasal washings and aspirates are unacceptable for Xpert Xpress SARS-CoV-2/FLU/RSV testing.  Fact Sheet for Patients: EntrepreneurPulse.com.au  Fact Sheet for Healthcare Providers: IncredibleEmployment.be  This test is not yet approved or cleared by the [place] FDA and has been  authorized for detection and/or diagnosis of SARS-CoV-2 by FDA under an Emergency Use Authorization (EUA). This EUA will remain in effect (meaning this test can be used) for the duration of the COVID-19 declaration under Section 564(b)(1) of the Act, 21 U.S.C. section 360bbb-3(b)(1), unless the authorization is terminated or revoked.  Performed at [facility], [place] [address], [place], [place] [address]   Urine Culture     Status: None   Collection Time: 07/17/21 11:58 PM   Specimen: Urine, Clean Catch  Result Value Ref Range Status   Specimen Description   Final    URINE, CLEAN CATCH Performed at [facility], [place] [address], [place], [place] [address]    Special Requests   Final    NONE Performed at [facility], [place] [address] [place], [place] [address]    Culture   Final    NO GROWTH Performed at [facility], [place] [address] [place], [place] [address]    Report Status 07/19/2021 FINAL  Final  Blood culture (routine x 2)     Status: None   Collection Time: 07/18/21  2:10 AM   Specimen: BLOOD  Result Value Ref Range Status   Specimen Description   Final    BLOOD LEFT ANTECUBITAL Performed at [place] [address] [place], [place] [address]    Special Requests   Final    BOTTLES DRAWN AEROBIC ONLY Blood Culture adequate volume Performed at [place] [address] [place], [place] [address]    Culture   Final    NO GROWTH 5 DAYS Performed at [facility], [place] [address], [place], [place] [address]    Report Status 07/23/2021 FINAL  Final  Blood culture (routine x 2)     Status: None   Collection Time: 07/18/21  2:10 AM   Specimen: BLOOD  Result Value Ref Range Status   Specimen Description   Final    BLOOD BLOOD RIGHT FOREARM Performed at [place] [address], [place], [place] [address]    Special Requests   Final     BOTTLES DRAWN AEROBIC ONLY Blood Culture adequate volume Performed at [place] [address] [place], [place] [address]    Culture   Final    NO GROWTH 5 DAYS Performed at [facility], [place] [address] [place], [place] [address]    Report Status 07/23/2021 FINAL  Final  Surgical pcr screen     Status: None   Collection Time: 07/20/21  9:05 AM   Specimen: Nasal Mucosa; Nasal Swab  Result Value Ref Range Status   MRSA, PCR NEGATIVE NEGATIVE Final   Staphylococcus aureus NEGATIVE NEGATIVE Final    Comment: (NOTE) The Xpert SA Assay (FDA approved for NASAL specimens in patients 58 years of age and older), is one component of a comprehensive surveillance program. It is not intended to diagnose infection nor to guide or monitor treatment. Performed at [facility], [place] [address], [place], [place] [address]   C Difficile Quick Screen (NO PCR  Reflex)     Status: Abnormal   Collection Time: 07/23/21  9:20 AM   Specimen: STOOL  Result Value Ref Range Status   C Diff antigen POSITIVE (A) NEGATIVE Final   C Diff toxin POSITIVE (A) NEGATIVE Final   C Diff interpretation Toxin producing C. difficile detected.  Final    Comment: CRITICAL RESULT CALLED TO, READ BACK BY AND VERIFIED WITH: Joeseph Amor RN Performed at [place] [address] [place], [place] [address]          Oswald Hillock   [facility] If 7PM-7AM, please contact night-coverage at [email or web address], Office  [phone]   07/24/2021, 2:17 PM  LOS: 6 days

## 2021-07-25 DIAGNOSIS — K810 Acute cholecystitis: Secondary | ICD-10-CM | POA: Diagnosis not present

## 2021-07-25 LAB — BASIC METABOLIC PANEL
Anion gap: 6 (ref 5–15)
Anion gap: 8 (ref 5–15)
BUN: 79 mg/dL — ABNORMAL HIGH (ref 8–23)
BUN: 69 mg/dL — ABNORMAL HIGH (ref 8–23)
CO2: 32 mmol/L (ref 22–32)
CO2: 28 mmol/L (ref 22–32)
Calcium: 9.1 mg/dL (ref 8.9–10.3)
Calcium: 8.6 mg/dL — ABNORMAL LOW (ref 8.9–10.3)
Chloride: 103 mmol/L (ref 98–111)
Chloride: 103 mmol/L (ref 98–111)
Creatinine, Ser: 2.11 mg/dL — ABNORMAL HIGH (ref 0.61–1.24)
Creatinine, Ser: 1.92 mg/dL — ABNORMAL HIGH (ref 0.61–1.24)
GFR, Estimated: 33 mL/min — ABNORMAL LOW (ref >60)
GFR, Estimated: 37 mL/min — ABNORMAL LOW (ref >60)
Glucose, Bld: 200 mg/dL — ABNORMAL HIGH (ref 70–99)
Glucose, Bld: 165 mg/dL — ABNORMAL HIGH (ref 70–99)
Potassium: 4.5 mmol/L (ref 3.5–5.1)
Potassium: 4.1 mmol/L (ref 3.5–5.1)
Sodium: 141 mmol/L (ref 135–145)
Sodium: 139 mmol/L (ref 135–145)

## 2021-07-25 LAB — CBC
HCT: 30.7 % — ABNORMAL LOW (ref 39.0–52.0)
HCT: 29.7 % — ABNORMAL LOW (ref 39.0–52.0)
HCT: 28.4 % — ABNORMAL LOW (ref 39.0–52.0)
Hemoglobin: 9.8 g/dL — ABNORMAL LOW (ref 13.0–17.0)
Hemoglobin: 9.4 g/dL — ABNORMAL LOW (ref 13.0–17.0)
Hemoglobin: 9.1 g/dL — ABNORMAL LOW (ref 13.0–17.0)
MCH: 26.1 pg (ref 26.0–34.0)
MCH: 26.3 pg (ref 26.0–34.0)
MCH: 26.4 pg (ref 26.0–34.0)
MCHC: 31.9 g/dL (ref 30.0–36.0)
MCHC: 31.6 g/dL (ref 30.0–36.0)
MCHC: 32 g/dL (ref 30.0–36.0)
MCV: 81.6 fL (ref 80.0–100.0)
MCV: 83.2 fL (ref 80.0–100.0)
MCV: 82.3 fL (ref 80.0–100.0)
Platelets: 107 10*3/uL — ABNORMAL LOW (ref 150–400)
Platelets: 125 10*3/uL — ABNORMAL LOW (ref 150–400)
Platelets: 128 10*3/uL — ABNORMAL LOW (ref 150–400)
RBC: 3.76 MIL/uL — ABNORMAL LOW (ref 4.22–5.81)
RBC: 3.57 MIL/uL — ABNORMAL LOW (ref 4.22–5.81)
RBC: 3.45 MIL/uL — ABNORMAL LOW (ref 4.22–5.81)
RDW: 21 % — ABNORMAL HIGH (ref 11.5–15.5)
RDW: 21.4 % — ABNORMAL HIGH (ref 11.5–15.5)
RDW: 21.5 % — ABNORMAL HIGH (ref 11.5–15.5)
WBC: 15.9 10*3/uL — ABNORMAL HIGH (ref 4.0–10.5)
WBC: 16.6 10*3/uL — ABNORMAL HIGH (ref 4.0–10.5)
WBC: 14.4 10*3/uL — ABNORMAL HIGH (ref 4.0–10.5)
nRBC: 0.5 % — ABNORMAL HIGH (ref 0.0–0.2)
nRBC: 0.8 % — ABNORMAL HIGH (ref 0.0–0.2)
nRBC: 1.2 % — ABNORMAL HIGH (ref 0.0–0.2)

## 2021-07-25 LAB — GLUCOSE, CAPILLARY
Glucose-Capillary: 153 mg/dL — ABNORMAL HIGH (ref 70–99)
Glucose-Capillary: 153 mg/dL — ABNORMAL HIGH (ref 70–99)
Glucose-Capillary: 175 mg/dL — ABNORMAL HIGH (ref 70–99)
Glucose-Capillary: 185 mg/dL — ABNORMAL HIGH (ref 70–99)
Glucose-Capillary: 230 mg/dL — ABNORMAL HIGH (ref 70–99)

## 2021-07-25 LAB — MAGNESIUM: Magnesium: 2.2 mg/dL (ref 1.7–2.4)

## 2021-07-25 MED ORDER — LACTATED RINGERS IV BOLUS
500.0000 mL | Freq: Once | INTRAVENOUS | Status: AC
  Administered 2021-07-25: 500 mL via INTRAVENOUS

## 2021-07-25 MED ORDER — ORAL CARE MOUTH RINSE
15.0000 mL | Freq: Two times a day (BID) | OROMUCOSAL | Status: DC
  Administered 2021-07-25 – 2021-08-09 (×30): 15 mL via OROMUCOSAL

## 2021-07-25 NOTE — Consult Note (Signed)
NAME:  [name] [name], MRN:  [id number], DOB:  June 18, 1951, LOS: 7 ADMISSION DATE:  07/17/2021, CONSULTATION DATE: 07/24/2021 REFERRING MD: Barrett Henle, CHIEF COMPLAINT: Bloody stool  History of Present Illness:  70 year old man with history of CHF EF 25% presented with abdominal pain found to have cholecystitis status post ERCP, deferred cholecystectomy given surgical risk finished course of antibiotics 9/2 who developed diffuse diarrhea 9/1 tested positive for C. difficile 9/3 who transferred to the ICU in the setting of hypotension and bright red blood per rectum.  Overall, patient feels fine.  Denies feeling better in the last couple days.  Denies shortness of breath..  He feels thirsty. reviewed vital signs of development of worsening tachycardia albeit mild throughout the last 24 hours.  Blood pressure systolics [id number] to 0000000 down from more normal values recently.  His hemoglobin is down 2 points over the last couple of days.  His weight is down a total of over 30 pounds compared to admission.  He is been aggressively diuresed.  Still with about 200 cc of frank red or maroon stool.  Feels need to urinate.  Pertinent  Medical History  CHF likely component of infiltrative disease, EF 25%  Significant Hospital Events: Including procedures, antibiotic start and stop dates in addition to other pertinent events   Admitted 8/28 with acute cholecystitis placed on antibiotics 8/30 ERCP 9/1 had diarrhea 9/2 completed course of Augmentin 9/3 tested positive for C. Difficile 9/4 bright red blood per rectum, hypotension, transferred to stepdown, NE breifely, EGD with likely source bleeding sphincterotomy recent ERCP   Interim History / Subjective:  EGD went well. Hgb stable mid 9s.  Objective   Blood pressure (!) 98/55, pulse 72, temperature (!) 96.2 F (35.7 C), temperature source Oral, resp. rate 13, height '6\' 1"'$  (1.854 m), weight 86.9 kg, SpO2 98 %.        Intake/Output Summary (Last 24 hours)  at 07/25/2021 1008 Last data filed at 07/25/2021 1007 Gross per 24 hour  Intake 894.27 ml  Output 876 ml  Net 18.27 ml    Filed Weights   07/23/21 0500 07/24/21 0500 07/24/21 1615  Weight: 88.9 kg 85.3 kg 86.9 kg    Examination: General: Lying in bed, in no acute distress Eyes: EOMI, icterus Neck: Supple, no JVP appreciated with head of bed at 30 degrees Cardiovascular: Tachycardia, no murmur Pulmonary: Clear, normal work of breathing on room air Extremities: Warm, no edema Neuro: No focal deficits, sensation intact Psych: Normal mood, full affect  Resolved Hospital Problem list   Cholecystitis status post ERCP, no cholecystectomy, status post antibiotic course  Assessment & Plan:  Hypotension: In the setting of increased GI losses due to C. difficile worsened by intravascular losses from lower GI bleed likely from colitis.  He appears dry on exam with dry mucous membranes and increase skin turgor.  He has tachycardia new from prior.  All signs point to hypovolemia exacerbated by aggressive diuresis which was appropriate in the preceding days.  Given underlying cardiomyopathy, cautious fluid boluses. --Repeat LR bolus 500 cc (1L total 9/4) --Peripheral norepinephrine as needed - low dose ON, off this AM  Lower GI bleed: Bright red blood/maroon stools.  Has had a C. difficile.  EGD with stigmata of bleeding from ERCP sphincterotomy.  Exacerbated by anticoagulation. --s/p Kcentra  --Hold Eliquis --PM CBC  Heart failure with reduced EF: --Hold Lasix and spironolactone given hypotension/hypovolemia --Daily weights  Best Practice (right click and "Reselect all SmartList Selections" daily)  Diet: Clears  Central line: No Foley: No DVT prophylaxis: Holding prophylaxis as well as home Eliquis in the setting of massive GI bleed Goals of care: N/A  Labs   CBC: Recent Labs  Lab 07/19/21 0513 07/20/21 0547 07/22/21 0538 07/23/21 2102 07/24/21 0518 07/24/21 1429 07/24/21 1906  07/25/21 0246  WBC 12.3* 8.8   < > 14.1* 13.6* 17.5* 21.1* 15.9*  NEUTROABS 10.5* 7.8*  --  12.2*  --   --   --   --   HGB 16.1 15.9   < > 13.3 12.3* 11.5* 9.6* 9.8*  HCT 50.7 49.5   < > 42.0 38.9* 37.2* 31.0* 30.7*  MCV 80.1 80.9   < > 80.3 81.0 83.8 82.2 81.6  PLT 86* 106*   < > 112* 112* 116* 121* 107*   < > = values in this interval not displayed.     Basic Metabolic Panel: Recent Labs  Lab 07/19/21 0513 07/20/21 0547 07/21/21 0541 07/22/21 0538 07/23/21 0555 07/24/21 0518 07/25/21 0246  NA 135 135 139 138 140 139 141  K 4.2 4.6 3.8 3.6 4.2 4.2 4.5  CL 101 99 100 98 104 102 103  CO2 '25 27 28 26 26 28 '$ 32  GLUCOSE 121* 176* 149* 156* 149* 131* 200*  BUN 40* 47* 53* 69* 78* 76* 79*  CREATININE 2.12* 2.47* 2.65* 2.30* 2.05* 2.10* 2.11*  CALCIUM 9.3 9.5 9.6 9.3 9.2 8.4* 9.1  MG 2.2 2.2 2.2 1.9 2.1  --   --     GFR: Estimated Creatinine Clearance: 37.9 mL/min (A) (by C-G formula based on SCr of 2.11 mg/dL (H)). Recent Labs  Lab 07/20/21 1354 07/21/21 0541 07/22/21 0538 07/24/21 0518 07/24/21 1429 07/24/21 1906 07/25/21 0246  WBC  --   --    < > 13.6* 17.5* 21.1* 15.9*  LATICACIDVEN 2.0* 1.5  --   --   --   --   --    < > = values in this interval not displayed.     Liver Function Tests: Recent Labs  Lab 07/19/21 0513 07/20/21 0547  AST 53* 57*  ALT 24 24  ALKPHOS 210* 277*  BILITOT 3.4* 3.2*  PROT 6.4* 6.4*  ALBUMIN 2.8* 2.7*    Recent Labs  Lab 07/19/21 0513 07/20/21 0547  LIPASE 40 63*    No results for input(s): AMMONIA in the last 168 hours.  ABG    Component Value Date/Time   TCO2 25 02/09/2011 2257      Coagulation Profile: Recent Labs  Lab 07/24/21 1429  INR 2.4*     Cardiac Enzymes: No results for input(s): CKTOTAL, CKMB, CKMBINDEX, TROPONINI in the last 168 hours.  HbA1C: Hgb A1c MFr Bld  Date/Time Value Ref Range Status  07/18/2021 02:10 AM 6.6 (H) 4.8 - 5.6 % Final    Comment:    (NOTE) Pre diabetes:           5.7%-6.4%  Diabetes:              >6.4%  Glycemic control for   <7.0% adults with diabetes   05/04/2021 04:32 AM 6.6 (H) 4.8 - 5.6 % Final    Comment:    (NOTE)         Prediabetes: 5.7 - 6.4         Diabetes: >6.4         Glycemic control for adults with diabetes: <7.0     CBG: Recent Labs  Lab 07/24/21 1145 07/24/21 1723 07/24/21 2306 07/25/21  [id number] 07/25/21 0814  GLUCAP 168* 203* 206* 185* 175*     Review of Systems:   Denies orthopnea or PND.  No lower extremity swelling.  Comprehensive review of systems otherwise negative.  Past Medical History:  He,  has a past medical history of Acute on chronic congestive heart failure ([place]), Acute on chronic systolic CHF (congestive heart failure) ([place]) (05/04/2021), Arthritis of left knee (12/02/2020), Bilateral leg edema (05/04/2017), CAD (coronary artery disease), CAD (coronary artery disease), native coronary artery (11/12/2018), CAD, NATIVE VESSEL (06/15/2009), CAP (community acquired pneumonia) (08/17/2016), Depressive disorder (11/12/2018), Diabetes mellitus due to underlying condition with unspecified complications ([place]) (0000000), DM (diabetes mellitus) ([place]), 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), Erythrocytosis (06/01/2020), Essential hypertension (10/14/2007), Gout, unspecified (10/14/2007), History of colonic polyps (11/12/2018), History of kidney stones, History of nephrolithiasis (11/12/2018), History of smoking (11/12/2018), HTN (hypertension), Hyperlipidemia, HYPERTENSION, BENIGN (06/15/2009), Inguinal hernia without mention of obstruction or gangrene, recurrent unilateral or unspecified (11/12/2018), Ischemic cardiomyopathy (08/12/2020), Medication intolerance (11/12/2018), Mixed hyperlipidemia (11/12/2018), Old myocardial infarction (11/12/2018), Paroxysmal atrial fibrillation ([place]) (07/16/2020), Pre-operative cardiovascular examination (11/12/2018), Primary osteoarthritis of both knees (03/05/2017),  Renal insufficiency (08/12/2020), Right carotid bruit (08/05/2014), 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   Surgical History:   Past Surgical History:  Procedure Laterality Date   BILATERAL KNEE ARTHROSCOPY     CORONARY ANGIOPLASTY WITH STENT PLACEMENT     ERCP N/A 07/19/2021   Procedure: ENDOSCOPIC RETROGRADE CHOLANGIOPANCREATOGRAPHY (ERCP);  Surgeon: Carol Ada, MD;  Location: [facility];  Service: Endoscopy;  Laterality: N/A;   LAPAROSCOPIC INGUINAL HERNIA REPAIR     LEFT HEART CATH AND CORONARY ANGIOGRAPHY N/A 05/05/2021   Procedure: LEFT HEART CATH AND CORONARY ANGIOGRAPHY;  Surgeon: Troy Sine, MD;  Location: [facility];  Service: Cardiovascular;  Laterality: N/A;   NOSE SURGERY     REMOVAL OF STONES  07/19/2021   Procedure: REMOVAL OF STONES;  Surgeon: Carol Ada, MD;  Location: [facility];  Service: Endoscopy;;   SPHINCTEROTOMY  07/19/2021   Procedure: Joan Mayans;  Surgeon: Carol Ada, MD;  Location: [facility];  Service: Endoscopy;;   TOTAL KNEE ARTHROPLASTY Right 11/15/2018   Procedure: RIGHT TOTAL KNEE ARTHROPLASTY;  Surgeon: Mcarthur Rossetti, MD;  Location: [facility];  Service: Orthopedics;  Laterality: Right;   TOTAL KNEE ARTHROPLASTY Left 12/03/2020   Procedure: LEFT TOTAL KNEE ARTHROPLASTY;  Surgeon: Mcarthur Rossetti, MD;  Location: [facility];  Service: Orthopedics;  Laterality: Left;     Social History:   reports that he quit smoking about 32 years ago. His smoking use included cigarettes. He has a 5.00 pack-year smoking history. He quit smokeless tobacco use about 52 years ago.  His smokeless tobacco use included snuff and chew. He reports that he does not drink alcohol and does not use  drugs.   Family History:  His family history includes Cancer in his mother; Diabetes in his father; Heart attack in his father; Hypertension in his brother, father, and mother; Irregular heart beat in his brother.   Allergies Allergies  Allergen Reactions   Allopurinol Other (See Comments)    Felt terrible   Aripiprazole Other (See Comments)    Felt terrible   Bupropion Other (See Comments)    Felt bad   Buspirone Other (See Comments)    Felt bad   Desvenlafaxine  Other (See Comments)    Made him hyper   Duloxetine Other (See Comments)    Knocked him out   Escitalopram Other (See Comments)    Felt like crap   Fish Oil     unknown   Fluticasone Furoate Other (See Comments)    Unknown    Hydroxyzine Hcl Other (See Comments)    Knocked him out   Nortriptyline Other (See Comments)    Ineffective   Paroxetine Hcl Other (See Comments)    unknown   Ramipril Cough   Sertraline Other (See Comments)    "felt plugged in"   Venlafaxine Other (See Comments)    Felt bad   Vilazodone Other (See Comments)    "felt weird"     Home Medications  Prior to Admission medications   Medication Sig Start Date End Date Taking? Authorizing Provider  ALPRAZolam Duanne Moron) 0.25 MG tablet Take 0.25 mg by mouth at bedtime as needed for anxiety.  11/27/16  Yes [provider]  apixaban (ELIQUIS) 5 MG TABS tablet Take 1 tablet (5 mg total) by mouth 2 (two) times daily. 05/06/21 07/18/21 Yes Elodia Florence., MD  colchicine 0.6 MG tablet Take 0.6 mg by mouth daily as needed for other. Gout flare up 05/02/21  Yes [provider]  cyanocobalamin (,VITAMIN B-12,) 1000 MCG/ML injection Inject 1,000 mcg into the muscle every 30 (thirty) days.  05/12/16  Yes [provider]  dapagliflozin propanediol (FARXIGA) 10 MG TABS tablet Take 1 tablet (10 mg total) by mouth at bedtime. 05/11/21  Yes Katherine Roan, MD  furosemide (LASIX) 40 MG tablet Take 1 tablet (40 mg total) by mouth  daily. 05/30/21  Yes Katherine Roan, MD  LANTUS SOLOSTAR 100 UNIT/ML Solostar Pen Inject 18 Units into the skin at bedtime. 03/14/20  Yes [provider]  levalbuterol (XOPENEX HFA) 45 MCG/ACT inhaler Inhale 1 puff into the lungs every 4 (four) hours as needed for wheezing.   Yes [provider]  potassium chloride SA (KLOR-CON) 20 MEQ tablet Take 1 tablet (20 mEq total) by mouth daily. 05/30/21  Yes Katherine Roan, MD  rosuvastatin (CRESTOR) 40 MG tablet Take 1 tablet (40 mg total) by mouth at bedtime. 05/30/21  Yes Katherine Roan, MD  spironolactone (ALDACTONE) 25 MG tablet Take 1 tablet (25 mg total) by mouth at bedtime. 07/04/21  Yes Bensimhon, Shaune Pascal, MD  traMADol (ULTRAM) 50 MG tablet Take 25 mg by mouth every 4 (four) hours as needed for pain or severe pain. 11/13/20  Yes [provider]  albuterol (VENTOLIN HFA) 108 (90 Base) MCG/ACT inhaler Inhale 2 puffs into the lungs every 6 (six) hours as needed for shortness of breath. 05/03/21   [provider]  CIALIS 20 MG tablet Take 20 mg by mouth daily as needed for erectile dysfunction.  Patient not taking: Reported on 07/18/2021 02/06/17   [provider]  fluticasone (FLONASE) 50 MCG/ACT nasal spray Place 1 spray into both nostrils daily as needed for allergies or rhinitis.    [provider]     Critical care time:     CRITICAL CARE Performed by: Lanier Clam   Total critical care time: 31 minutes  Critical care time was exclusive of separately billable procedures and treating other patients.  Critical care was necessary to treat or prevent imminent or life-threatening deterioration.  Critical care was time spent personally by me on the following activities: development of treatment plan with patient and/or  surrogate as well as nursing, discussions with consultants, evaluation of patient's response to treatment, examination of patient, obtaining history from patient  or surrogate, ordering and performing treatments and interventions, ordering and review of laboratory studies, ordering and review of radiographic studies, pulse oximetry and re-evaluation of patient's condition.

## 2021-07-25 NOTE — Progress Notes (Signed)
Axillary temperature of 94.0, also checked temperature in groin which was 94.0 but did receive in report from off-going nurse that this had been the case during the day. Thermostat in room had been increased on prior shift. Patient refusing rectal temperature to be obtained. Educated patient on importance of maintaining a normothermic status and he began tossing blankets off of himself. Explained risks of becoming hypothermic and did advise patient that he has the right to refuse but we may need to discuss changing his code status with a provider if wants to refuse important aspects of care. Patient agreed to wear bear-hugger for two hour and we will reassess temperature.

## 2021-07-25 NOTE — Progress Notes (Addendum)
     [name] [name] cross-covering for Dr. Adriana [name]  CC:  GI bleeding s/p ERCP with sphinctertomy  Subjective: Per patient's nurse, he had a couple of dark stools overnight, likely residual blood.  No bowel movements at all today.  Patient reports some upper abdominal pain about a 6 out of 10 on the pain scale.  His nurse is going to get him some pain medication.  No nausea.  Objective:  Vital signs in last 24 hours: Temp:  [94.5 F (34.7 C)-97.8 F (36.6 C)] 94.5 F (34.7 C) (09/05 1143) Pulse Rate:  [64-125] 65 (09/05 1312) Resp:  [10-27] 13 (09/05 0000) BP: (88-136)/(49-75) 119/70 (09/05 1400) SpO2:  [94 %-100 %] 100 % (09/05 1312) Weight:  [86.9 kg] 86.9 kg (09/04 1615) Last BM Date: 07/25/21 General:  Alert, sitting up in bed in NAD Heart:  Regular rate and rhythm; no murmurs Pulm:  CTAB.  No W/R/R. Abdomen:  Soft, non-distended.  BS present.  Moderate epigastric TTP. Extremities:  Without edema. Neurologic:  Alert and oriented x 4;  grossly normal neurologically. Psych:  Alert and cooperative. Normal mood and affect.  Lab Results: Recent Labs    07/24/21 1906 07/25/21 0246 07/25/21 1351  WBC 21.1* 15.9* 16.6*  HGB 9.6* 9.8* 9.4*  HCT 31.0* 30.7* 29.7*  PLT 121* 107* 125*   BMET Recent Labs    07/23/21 0555 07/24/21 0518 07/25/21 0246  NA 140 139 141  K 4.2 4.2 4.5  CL 104 102 103  CO2 26 28 32  GLUCOSE 149* 131* 200*  BUN 78* 76* 79*  CREATININE 2.05* 2.10* 2.11*  CALCIUM 9.2 8.4* 9.1    PT/INR Recent Labs    07/24/21 1429  LABPROT 25.7*  INR 2.4*   Assessment / Plan: *70 y.o. male with GI bleeding at biliary sphincterotomy site s/p EGD at bedside with visible vessel seen and treated with epi and endo clip by Dr. Ardis [name] on 9/4.  Hgb stable at 9.4 grams this AM.  Continue PPI drip.  Continue to trend hemoglobin and transfuse further if needed.  If rebleeds significantly then may need IR intervention.  Continue clear liquids  for now.   He had been restarted on eliquis BID the day after his ERCP.  Was given Kcentra prior to EGD.  *C. difficile diarrhea: On vancomycin.  **Dr. Benson [name] will assume his care again tomorrow.   LOS: 7 days   [name] [name]  07/25/2021, 2:31 PM   ________________________________________________________________________  [name] [name] GI MD [name]:  I personally examined the patient, reviewed the data and agree with the assessment and plan described above.  Seems like the post biliary sphincterotomy bleeding has stopped. I would recommend holding his eliquis for at least 4-5 more days if possible.  Drs. Hung/Mann will resume his care in the morning.   [name] Loffler, MD [facility] Pager [phone]

## 2021-07-26 ENCOUNTER — Encounter (HOSPITAL_COMMUNITY): Payer: Medicare Other

## 2021-07-26 ENCOUNTER — Encounter (HOSPITAL_COMMUNITY): Payer: Self-pay | Admitting: Gastroenterology

## 2021-07-26 DIAGNOSIS — R109 Unspecified abdominal pain: Secondary | ICD-10-CM

## 2021-07-26 DIAGNOSIS — R319 Hematuria, unspecified: Secondary | ICD-10-CM

## 2021-07-26 DIAGNOSIS — K810 Acute cholecystitis: Secondary | ICD-10-CM | POA: Diagnosis not present

## 2021-07-26 DIAGNOSIS — K8080 Other cholelithiasis without obstruction: Secondary | ICD-10-CM | POA: Diagnosis not present

## 2021-07-26 DIAGNOSIS — I10 Essential (primary) hypertension: Secondary | ICD-10-CM

## 2021-07-26 LAB — GLUCOSE, CAPILLARY
Glucose-Capillary: 121 mg/dL — ABNORMAL HIGH (ref 70–99)
Glucose-Capillary: 203 mg/dL — ABNORMAL HIGH (ref 70–99)
Glucose-Capillary: 166 mg/dL — ABNORMAL HIGH (ref 70–99)
Glucose-Capillary: 143 mg/dL — ABNORMAL HIGH (ref 70–99)

## 2021-07-26 NOTE — Progress Notes (Signed)
Abrasion of.  Lipid repeat evaluation Reviewed[facility]ist  PROGRESS NOTE  ANTHONE VAUTOUR [id number] DOB: June 14, 1951 DOA: 07/17/2021 PCP: Raeanne Gathers, MD   Brief HPI:   70 year old male with history of diabetes mellitus type 2, chronic systolic CHF, CKD stage III, paroxysmal atrial fibrillation, CAD, hypertension, osteoarthritis, patient presented with abdominal pain and diarrhea.  On presentation was found to have acute cholecystitis with choledocholithiasis and mild pancreatitis along with leukocytosis elevated LFTs.  He was started on broad-spectrum antibiotics.  GI and general surgery were consulted.  Patient was supposed to get cholecystectomy however surgery was canceled as cardiology did not recommend patient to undergo surgery due to risk involved with surgery. Patient developed diarrhea, stool for C. difficile PCR was checked and was found to be positive.  He was started on p.o. vancomycin.  1 day later patient started having bloody stools, patient has been on Eliquis for atrial fibrillation.  He received Kcentra for reversal of Eliquis, stat gastroenterology consultation was obtained, underwent EGD at bedside and found to have bleeding vessel at sphincterotomy site which was endoclipped on 07/24/2021.  He was transferred to ICU for hemorrhagic shock, briefly required Levophed.  He is currently off pressors.  Hemoglobin has been stable.  Bleeding stopped.  Patient started back on clear liquid diet, GI recommended to hold Eliquis for 4 to 5 days.  Consider restarting on 07/30/2021.  Subjective   Patient seen and examined, diarrhea has improved.   Assessment/Plan:    Lower GI bleed in setting of C. difficile colitis -Patient was on Eliquis for anticoagulation for atrial fibrillation - Eliquis, was discontinued after discussion with cardiology -Eliquis was reversed with IV Kcentra  -Gastroenterology has been consulted and he underwent bedside EGD with Endo clipping of  bleeding vessel at sphincterotomy site -He was transferred to ICU and required Levophed briefly -Currently he is weaned off Levophed -Patient required blood transfusion -GI bleed has resolved -Hemoglobin stable at 9.1   C. difficile diarrhea -Patient developed diarrhea since 07/21/2021 after starting Augmentin -Since he did not have any symptoms of abdominal pain fever or leukocytosis, he was given 1 dose of Imodium with some improvement -Diarrhea restarted with multiple episodes of loose stools -Called and discussed with ID, though patient did not have symptoms but had recent antibiotic use so he is at high risk for developing C. difficile infection -C. difficile PCR obtained, which turned out to be positive --Patient started on p.o. vancomycin 125 mg 4 times daily -He was started on IV Flagyl  500 mg every 8 hours; since he is taking by mouth, will discontinue Flagyl  Acute calculus cholecystitis/choledocholithiasis/biliary pancreatitis -Patient underwent ERCP which showed choledocholithiasis, underwent stone removal with sphincterotomy -Cholecystectomy was scheduled for this morning however it was canceled after cardiology recommended against it -General surgery has signed off and will follow patient in their office -Patient received 3 days of IV Zosyn, IV Zosyn was discontinued and patient started on Augmentin 1 tablet p.o. twice daily for 2 more days.  Antibiotic treatment was completed on  07/22/2021.  Acute on chronic combined systolic and diastolic heart failure -Patient has EF of 20 to 25%, severe RV dysfunction, findings concerning for amyloid -Cardiology has ordered PYP scan to evaluate for amyloid, SPEP/UPEP/light chains -Started on IV Lasix for fluid overload, Lasix currently on hold due to worsening renal function -Cardiology discontinued IV Lasix and started on p.o. Lasix -P.o. Lasix is currently on hold due to ongoing diarrhea.  Thrombocytopenia -Resolved -Likely from  underlying cholecystitis  Paroxysmal atrial fibrillation -Eliquis was restarted, will hold Eliquis at this time because of GI bleed as above. -Beta-blocker on hold considering CHF as above  CAD s/p NSTEMI in 2008 with stent to RCA and LAD -Continue statin  Diabetes mellitus type 2 -[place] is currently on hold due to acute kidney injury -Continue sliding scale insulin with NovoLog   Acute kidney injury on CKD stage IIIb -Patient baseline creatinine is around 1.8 -Presented with creatinine of 2.02, which has worsened to 2.65 yesterday.  Creatinine has improved to 1.92 after holding Lasix.  Creatinine likely at baseline -Likely from CHF exacerbation, also patient became hypotensive yesterday during ERCP requiring pressor support -He was started on scheduled IV Lasix 40 mg IV every 12 hours.  IV Lasix is currently on hold for worsening renal function.    Scheduled medications:    Chlorhexidine Gluconate Cloth  6 each Topical Daily   mouth rinse  15 mL Mouth Rinse BID   [START ON 07/28/2021] pantoprazole  40 mg Intravenous Q12H   rosuvastatin  40 mg Oral QHS   vancomycin  125 mg Oral QID         Data Reviewed:   CBG:  Recent Labs  Lab 07/25/21 1746 07/25/21 2349 07/26/21 0605 07/26/21 1209 07/26/21 1724  GLUCAP 230* 153* 121* 203* 166*    SpO2: 99 % O2 Flow Rate (L/min): 4 L/min    Vitals:   07/26/21 0900 07/26/21 1000 07/26/21 1133 07/26/21 1520  BP: (!) 110/56 (!) 103/54 (!) 108/59 113/69  Pulse: 82 77 73 62  Resp:   18 16  Temp:   97.7 F (36.5 C) (!) 97.4 F (36.3 C)  TempSrc:   Oral Oral  SpO2: (!) 81% 100% 99% 99%  Weight:      Height:         Intake/Output Summary (Last 24 hours) at 07/26/2021 1909 Last data filed at 07/26/2021 0955 Gross per 24 hour  Intake 404.69 ml  Output 550 ml  Net -145.31 ml    09/05 0701 - 09/06 1900 In: 1340.2 [I.V.:468.1] Out: 1130 [id number]  Filed Weights   07/23/21 0500 07/24/21 0500 07/24/21 1615  Weight:  88.9 kg 85.3 kg 86.9 kg    CBC:  Recent Labs  Lab 07/20/21 0547 07/22/21 0538 07/23/21 2102 07/24/21 0518 07/24/21 1429 07/24/21 1906 07/25/21 0246 07/25/21 1351 07/25/21 2219  WBC 8.8   < > 14.1*   < > 17.5* 21.1* 15.9* 16.6* 14.4*  HGB 15.9   < > 13.3   < > 11.5* 9.6* 9.8* 9.4* 9.1*  HCT 49.5   < > 42.0   < > 37.2* 31.0* 30.7* 29.7* 28.4*  PLT 106*   < > 112*   < > 116* 121* 107* 125* 128*  MCV 80.9   < > 80.3   < > 83.8 82.2 81.6 83.2 82.3  MCH 26.0   < > 25.4*   < > 25.9* 25.5* 26.1 26.3 26.4  MCHC 32.1   < > 31.7   < > 30.9 31.0 31.9 31.6 32.0  RDW 21.6*   < > 21.1*   < > 21.2* 20.9* 21.0* 21.4* 21.5*  LYMPHSABS 0.4*  --  1.0  --   --   --   --   --   --   MONOABS 0.5  --  0.8  --   --   --   --   --   --   EOSABS 0.0  --  0.0  --   --   --   --   --   --  BASOSABS 0.0  --  0.0  --   --   --   --   --   --    < > = values in this interval not displayed.    Complete metabolic panel:  Recent Labs  Lab 07/20/21 0547 07/20/21 1354 07/21/21 0541 07/22/21 0538 07/23/21 0555 07/24/21 0518 07/24/21 1429 07/25/21 0246 07/25/21 2219  NA 135  --  139 138 140 139  --  141 139  K 4.6  --  3.8 3.6 4.2 4.2  --  4.5 4.1  CL 99  --  100 98 104 102  --  103 103  CO2 27  --  '28 26 26 28  '$ --  32 28  GLUCOSE 176*  --  149* 156* 149* 131*  --  200* 165*  BUN 47*  --  53* 69* 78* 76*  --  79* 69*  CREATININE 2.47*  --  2.65* 2.30* 2.05* 2.10*  --  2.11* 1.92*  CALCIUM 9.5  --  9.6 9.3 9.2 8.4*  --  9.1 8.6*  AST 57*  --   --   --   --   --   --   --   --   ALT 24  --   --   --   --   --   --   --   --   ALKPHOS 277*  --   --   --   --   --   --   --   --   BILITOT 3.2*  --   --   --   --   --   --   --   --   ALBUMIN 2.7*  --   --   --   --   --   --   --   --   MG 2.2  --  2.2 1.9 2.1  --   --   --  2.2  LATICACIDVEN  --  2.0* 1.5  --   --   --   --   --   --   INR  --   --   --   --   --   --  2.4*  --   --   BNP  --   --  880.7*  --   --   --   --   --   --     Recent  Labs  Lab 07/20/21 0547  LIPASE 63*    Recent Labs  Lab 07/21/21 0541  BNP 880.7*    ------------------------------------------------------------------------------------------------------------------ No results for input(s): CHOL, HDL, LDLCALC, TRIG, CHOLHDL, LDLDIRECT in the last 72 hours.  Lab Results  Component Value Date   HGBA1C 6.6 (H) 07/18/2021   ------------------------------------------------------------------------------------------------------------------ No results for input(s): TSH, T4TOTAL, T3FREE, THYROIDAB in the last 72 hours.  Invalid input(s): FREET3 ------------------------------------------------------------------------------------------------------------------ No results for input(s): VITAMINB12, FOLATE, FERRITIN, TIBC, IRON, RETICCTPCT in the last 72 hours.  Coagulation profile Recent Labs  Lab 07/24/21 1429  INR 2.4*   No results for input(s): DDIMER in the last 72 hours.  Cardiac Enzymes No results for input(s): CKTOTAL, CKMB, CKMBINDEX, TROPONINI in the last 168 hours.  ------------------------------------------------------------------------------------------------------------------    Component Value Date/Time   BNP 880.7 (H) 07/21/2021 0541     Antibiotics: Anti-infectives (From admission, onward)    Start     Dose/Rate Route Frequency Ordered Stop   07/24/21 1600  metroNIDAZOLE (FLAGYL) IVPB 500 mg  500 mg 100 mL/hr over 60 Minutes Intravenous Every 8 hours 07/24/21 1416     07/23/21 1500  vancomycin (VANCOCIN) capsule 125 mg        125 mg Oral 4 times daily 07/23/21 1358 08/02/21 1359   07/21/21 1400  amoxicillin-clavulanate (AUGMENTIN) 875-125 MG per tablet 1 tablet        1 tablet Oral Every 12 hours 07/21/21 1057 07/22/21 2224   07/18/21 0800  piperacillin-tazobactam (ZOSYN) IVPB 3.375 g  Status:  Discontinued        3.375 g 12.5 mL/hr over 240 Minutes Intravenous Every 8 hours 07/18/21 0142 07/21/21 1057   07/18/21 0015   piperacillin-tazobactam (ZOSYN) IVPB 3.375 g        3.375 g 100 mL/hr over 30 Minutes Intravenous  Once 07/18/21 0007 07/18/21 0129        Radiology Reports  No results found.    DVT prophylaxis: SCDs  Code Status: Full code  Family Communication: No family at bedside   Consultants: Gastroenterology General surgery  Procedures:     Objective    Physical Examination:  General-appears in no acute distress Heart-S1-S2, regular, no murmur auscultated Lungs-clear to auscultation bilaterally, no wheezing or crackles auscultated Abdomen-soft, nontender, no organomegaly Extremities-no edema in the lower extremities Neuro-alert, oriented x3, no focal deficit noted  Status is: Inpatient  Dispo: The patient is from: Home              Anticipated d/c is to: Home              Anticipated d/c date is: 07/30/2021              Patient currently not stable for discharge  Barrier to discharge-worsening renal function, C. difficile diarrhea  COVID-19 Labs  No results for input(s): DDIMER, FERRITIN, LDH, CRP in the last 72 hours.  Lab Results  Component Value Date   [place] NEGATIVE 07/17/2021   [place] NEGATIVE 05/04/2021   [place] NEGATIVE 11/30/2020    Microbiology  Recent Results (from the past 240 hour(s))  Resp Panel by RT-PCR (Flu A&B, Covid) Nasopharyngeal Swab     Status: None   Collection Time: 07/17/21  8:01 PM   Specimen: Nasopharyngeal Swab; Nasopharyngeal(NP) swabs in vial transport medium  Result Value Ref Range Status   SARS Coronavirus 2 by RT PCR NEGATIVE NEGATIVE Final    Comment: (NOTE) SARS-CoV-2 target nucleic acids are NOT DETECTED.  The SARS-CoV-2 RNA is generally detectable in upper respiratory specimens during the acute phase of infection. The lowest concentration of SARS-CoV-2 viral copies this assay can detect is 138 copies/mL. A negative result does not preclude SARS-Cov-2 infection and should not be used as the sole  basis for treatment or other patient management decisions. A negative result may occur with  improper specimen collection/handling, submission of specimen other than nasopharyngeal swab, presence of viral mutation(s) within the areas targeted by this assay, and inadequate number of viral copies(<138 copies/mL). A negative result must be combined with clinical observations, patient history, and epidemiological information. The expected result is Negative.  Fact Sheet for Patients:  EntrepreneurPulse.com.au  Fact Sheet for Healthcare Providers:  IncredibleEmployment.be  This test is no t yet approved or cleared by the [place] FDA and  has been authorized for detection and/or diagnosis of SARS-CoV-2 by FDA under an Emergency Use Authorization (EUA). This EUA will remain  in effect (meaning this test can be used) for the duration of the COVID-19 declaration under Section 564(b)(1) of the  Act, 21 U.S.C.section 360bbb-3(b)(1), unless the authorization is terminated  or revoked sooner.       Influenza A by PCR NEGATIVE NEGATIVE Final   Influenza B by PCR NEGATIVE NEGATIVE Final    Comment: (NOTE) The Xpert Xpress SARS-CoV-2/FLU/RSV plus assay is intended as an aid in the diagnosis of influenza from Nasopharyngeal swab specimens and should not be used as a sole basis for treatment. Nasal washings and aspirates are unacceptable for Xpert Xpress SARS-CoV-2/FLU/RSV testing.  Fact Sheet for Patients: EntrepreneurPulse.com.au  Fact Sheet for Healthcare Providers: IncredibleEmployment.be  This test is not yet approved or cleared by the [place] FDA and has been authorized for detection and/or diagnosis of SARS-CoV-2 by FDA under an Emergency Use Authorization (EUA). This EUA will remain in effect (meaning this test can be used) for the duration of the COVID-19 declaration under Section 564(b)(1) of the Act,  21 U.S.C. section 360bbb-3(b)(1), unless the authorization is terminated or revoked.  Performed at [facility], [place] [address], [place], [place] [address]   Urine Culture     Status: None   Collection Time: 07/17/21 11:58 PM   Specimen: Urine, Clean Catch  Result Value Ref Range Status   Specimen Description   Final    URINE, CLEAN CATCH Performed at [facility], [place] [address] [place], [place] [address]    Special Requests   Final    NONE Performed at [facility], [place] [address], [place], [place] [address]    Culture   Final    NO GROWTH Performed at [facility], [place] [address] [place], [place] [address]    Report Status 07/19/2021 FINAL  Final  Blood culture (routine x 2)     Status: None   Collection Time: 07/18/21  2:10 AM   Specimen: BLOOD  Result Value Ref Range Status   Specimen Description   Final    BLOOD LEFT ANTECUBITAL Performed at [place] [address], [place], [place] [address]    Special Requests   Final    BOTTLES DRAWN AEROBIC ONLY Blood Culture adequate volume Performed at [place] [address], [place], [place] [address]    Culture   Final    NO GROWTH 5 DAYS Performed at [facility], [place] [address] [place], [place] [address]    Report Status 07/23/2021 FINAL  Final  Blood culture (routine x 2)     Status: None   Collection Time: 07/18/21  2:10 AM   Specimen: BLOOD  Result Value Ref Range Status   Specimen Description   Final    BLOOD BLOOD RIGHT FOREARM Performed at [place] [address], [place], [place] [address]    Special Requests   Final    BOTTLES DRAWN AEROBIC ONLY Blood Culture adequate volume Performed at [place] [address], [place], [place] [address]    Culture   Final    NO GROWTH 5 DAYS Performed at [facility], [place] [address],  [place], [place] [address]    Report Status 07/23/2021 FINAL  Final  Surgical pcr screen     Status: None   Collection Time: 07/20/21  9:05 AM   Specimen: Nasal Mucosa; Nasal Swab  Result Value Ref Range Status   MRSA, PCR NEGATIVE NEGATIVE Final   Staphylococcus aureus NEGATIVE NEGATIVE Final    Comment: (NOTE) The Xpert SA Assay (FDA approved for NASAL  specimens in patients 4 years of age and older), is one component of a comprehensive surveillance program. It is not intended to diagnose infection nor to guide or monitor treatment. Performed at [facility], [place] [address] [place], [place] [address]   C Difficile Quick Screen (NO PCR Reflex)     Status: Abnormal   Collection Time: 07/23/21  9:20 AM   Specimen: STOOL  Result Value Ref Range Status   C Diff antigen POSITIVE (A) NEGATIVE Final   C Diff toxin POSITIVE (A) NEGATIVE Final   C Diff interpretation Toxin producing C. difficile detected.  Final    Comment: CRITICAL RESULT CALLED TO, READ BACK BY AND VERIFIED WITH: Joeseph Amor RN Performed at [place] [address] [place], [place] [address]   MRSA Next Gen by PCR, Nasal     Status: None   Collection Time: 07/24/21  3:27 PM   Specimen: Nasal Mucosa; Nasal Swab  Result Value Ref Range Status   MRSA by PCR Next Gen NOT DETECTED NOT DETECTED Final    Comment: (NOTE) The GeneXpert MRSA Assay (FDA approved for NASAL specimens only), is one component of a comprehensive MRSA colonization surveillance program. It is not intended to diagnose MRSA infection nor to guide or monitor treatment for MRSA infections. Test performance is not FDA approved in patients less than 36 years old. Performed at [facility], [place] [address], [place],  [address]          Oswald Hillock   [facility] If 7PM-7AM, please contact night-coverage at [email or web address], Office  [phone]   07/26/2021, 7:09 PM  LOS: 8  days

## 2021-07-26 NOTE — Progress Notes (Signed)
Physical Therapy Treatment Patient Details Name: [name] [name] MRN: [id number] DOB: 01/16/1951 Today's Date: 07/26/2021    History of Present Illness 70 year old male with history of diabetes mellitus type 2, chronic systolic CHF, CKD stage III, paroxysmal atrial fibrillation, CAD, hypertension, osteoarthritis, patient presented with abdominal pain and diarrhea.  On presentation was found to have acute cholecystitis with choledocholithiasis and mild pancreatitis along with leukocytosis elevated LFTs., + C-Diff.    PT Comments    The patient  is having frequent loose stools. Patient did participate in UE' exercises. Unable to mobilize at this time. Continue PT as tolerated.   Follow Up Recommendations  SNF     Equipment Recommendations  None recommended by PT    Recommendations for Other Services       Precautions / Restrictions Precautions Precautions: Fall Precaution Comments: c diff, frequent stool Restrictions Weight Bearing Restrictions: No    Mobility  Bed Mobility                    Transfers                    Ambulation/Gait                 Stairs             Wheelchair Mobility    Modified Rankin (Stroke Patients Only)       Balance                                            Cognition Arousal/Alertness: Awake/alert Behavior During Therapy: Flat affect Overall Cognitive Status: Impaired/Different from baseline Area of Impairment: Problem solving                             Problem Solving: Slow processing;Decreased initiation;Requires verbal cues;Requires tactile cues General Comments: reluctant to answer questions, requires incr time to complete basic tasks      Exercises General Exercises - Upper Extremity Shoulder Flexion: AROM;Both;10 reps;Supine Shoulder ABduction: AROM;Both;10 reps;Supine Shoulder Horizontal ABduction: AROM;Both;10 reps;Supine Shoulder Horizontal ADduction:  AROM;Supine;Both;10 reps Elbow Flexion: AROM;10 reps;Both Elbow Extension: AROM;Both;10 reps    General Comments        Pertinent Vitals/Pain Pain Assessment: Faces Faces Pain Scale: Hurts even more Pain Location: back and buttocks, raw Pain Descriptors / Indicators: Grimacing;Moaning Pain Intervention(s): Monitored during session;Repositioned;Limited activity within patient's tolerance    Home Living                      Prior Function            PT Goals (current goals can now be found in the care plan section) Progress towards PT goals: Not progressing toward goals - comment (frequent loose BM)    Frequency    Min 2X/week      PT Plan Current plan remains appropriate;Frequency needs to be updated    Co-evaluation              AM-PAC PT "6 Clicks" Mobility   Outcome Measure  Help needed turning from your back to your side while in a flat bed without using bedrails?: A Little Help needed moving from lying on your back to sitting on the side of a flat bed without using bedrails?: A Lot Help needed moving to and  from a bed to a chair (including a wheelchair)?: Total Help needed standing up from a chair using your arms (e.g., wheelchair or bedside chair)?: Total Help needed to walk in hospital room?: Total Help needed climbing 3-5 steps with a railing? : Total 6 Click Score: 9    End of Session   Activity Tolerance: Patient limited by fatigue (BM ongoing)   Nurse Communication: Mobility status PT Visit Diagnosis: Other abnormalities of gait and mobility (R26.89);Muscle weakness (generalized) (M62.81)     Time: [id number] PT Time Calculation (min) (ACUTE ONLY): 23 min  Charges:  $Therapeutic Exercise: 23-37 mins                     Tresa Endo PT Acute Rehabilitation Services Pager [phone] Office [phone]    Claretha Cooper 07/26/2021, 10:33 AM

## 2021-07-26 NOTE — Progress Notes (Signed)
Subjective: "Everything is wrong."  Objective: Vital signs in last 24 hours: Temp:  [94 F (34.4 C)-98.5 F (36.9 C)] 98.5 F (36.9 C) (09/06 0800) Pulse Rate:  [60-86] 77 (09/06 1000) BP: (85-119)/(33-70) 103/54 (09/06 1000) SpO2:  [81 %-100 %] 100 % (09/06 1000) Last BM Date: 07/25/21  Intake/Output from previous day: 09/05 0701 - 09/06 0700 In: 1181.9 [I.V.:409.7; IV Piggyback:772.2] Out: 1130 [Urine:1130] Intake/Output this shift: Total I/O In: 158.4 [I.V.:58.4; IV Piggyback:99.9] Out: -   PE: Gen:  Upset with being transferred from ICU bed to regular bed  Lab Results: Recent Labs    07/25/21 0246 07/25/21 1351 07/25/21 2219  WBC 15.9* 16.6* 14.4*  HGB 9.8* 9.4* 9.1*  HCT 30.7* 29.7* 28.4*  PLT 107* 125* 128*   BMET Recent Labs    07/24/21 0518 07/25/21 0246 07/25/21 2219  NA 139 141 139  K 4.2 4.5 4.1  CL 102 103 103  CO2 28 32 28  GLUCOSE 131* 200* 165*  BUN 76* 79* 69*  CREATININE 2.10* 2.11* 1.92*  CALCIUM 8.4* 9.1 8.6*   LFT No results for input(s): PROT, ALBUMIN, AST, ALT, ALKPHOS, BILITOT, BILIDIR, IBILI in the last 72 hours. PT/INR Recent Labs    07/24/21 1429  LABPROT 25.7*  INR 2.4*   Hepatitis Panel No results for input(s): HEPBSAG, HCVAB, HEPAIGM, HEPBIGM in the last 72 hours. C-Diff No results for input(s): CDIFFTOX in the last 72 hours. Fecal Lactopherrin No results for input(s): FECLLACTOFRN in the last 72 hours.  Studies/Results: No results found.  Medications: Scheduled:  Chlorhexidine Gluconate Cloth  6 each Topical Daily   mouth rinse  15 mL Mouth Rinse BID   [START ON 07/28/2021] pantoprazole  40 mg Intravenous Q12H   rosuvastatin  40 mg Oral QHS   vancomycin  125 mg Oral QID   Continuous:  sodium chloride     lactated ringers     metronidazole Stopped (07/26/21 0946)   norepinephrine (LEVOPHED) Adult infusion 2 mcg/min (07/26/21 0955)   pantoprazole 8 mg/hr (07/26/21 0955)    Assessment/Plan: 1) S/p  hemoclipping of a visible vessel from the sphincterotomy. 2) [place] - Relatively stable. 3) C. Diff.   The patient is stable.  No overt reports of any hematochezia, melena, or hematemesis.  He is somewhat agitated with being transferred from the ICU bed to the regular bed.  From the GI standpoint he appears stable.    Plan: 1) No new recommendations. 2) If bleeding recurs please call. 3) Signing off.  LOS: 8 days   [name] [name] 07/26/2021, 11:19 AM

## 2021-07-26 NOTE — Progress Notes (Signed)
Physical Therapy Treatment Patient Details Name: [name] [name] MRN: [id number] DOB: Nov 14, 1951 Today's Date: 07/26/2021    History of Present Illness 70 year old male with history of diabetes mellitus type 2, chronic systolic CHF, CKD stage III, paroxysmal atrial fibrillation, CAD, hypertension, osteoarthritis, patient presented with abdominal pain and diarrhea.  On presentation was found to have acute cholecystitis with choledocholithiasis and mild pancreatitis along with leukocytosis elevated LFTs., + C-Diff.    PT Comments    Patient assisted with  bed mobility, rolling multiple times. Having loose BM so  OOB activity no indicated at thsi time. Continue mobility as tolerated.   Follow Up Recommendations  SNF     Equipment Recommendations  None recommended by PT    Recommendations for Other Services       Precautions / Restrictions Precautions Precautions: Fall Precaution Comments: c diff, frequent stool Restrictions Weight Bearing Restrictions: No    Mobility  Bed Mobility   Bed Mobility: Rolling Rolling: Min assist         General bed mobility comments: Pt. rolled multiple times  to be washed and linens changed. still not able to sit on bed edge due to frquent loose BMs.    Transfers                    Ambulation/Gait                 Stairs             Wheelchair Mobility    Modified Rankin (Stroke Patients Only)       Balance                                            Cognition Arousal/Alertness: Awake/alert Behavior During Therapy: Flat affect Overall Cognitive Status: Impaired/Different from baseline Area of Impairment: Problem solving                             Problem Solving: Slow processing;Decreased initiation;Requires verbal cues;Requires tactile cues General Comments: reluctant to answer questions, requires incr time to complete basic tasks      Exercises   General Comments         Pertinent Vitals/Pain Pain Assessment: Faces Faces Pain Scale: Hurts even more Pain Location: back and buttocks, raw Pain Descriptors / Indicators: Grimacing;Moaning Pain Intervention(s): Monitored during session;Repositioned    Home Living                      Prior Function            PT Goals (current goals can now be found in the care plan section) Progress towards PT goals: Progressing toward goals    Frequency    Min 2X/week      PT Plan Current plan remains appropriate;Frequency needs to be updated    Co-evaluation              AM-PAC PT "6 Clicks" Mobility   Outcome Measure  Help needed turning from your back to your side while in a flat bed without using bedrails?: A Little Help needed moving from lying on your back to sitting on the side of a flat bed without using bedrails?: A Lot Help needed moving to and from a bed to a chair (including a wheelchair)?: Total Help needed standing  up from a chair using your arms (e.g., wheelchair or bedside chair)?: Total Help needed to walk in hospital room?: Total Help needed climbing 3-5 steps with a railing? : Total 6 Click Score: 9    End of Session   Activity Tolerance: Patient limited by fatigue   Nurse Communication: Mobility status PT Visit Diagnosis: Other abnormalities of gait and mobility (R26.89);Muscle weakness (generalized) (M62.81)     Time: [id number] PT Time Calculation (min) (ACUTE ONLY): 26 min  Charges $Therapeutic Activity: 23-37 mins                     {Deyci Gesell PT Acute Rehabilitation Services Pager [phone] Office [phone]   Claretha Cooper 07/26/2021, 10:38 AM

## 2021-07-27 ENCOUNTER — Other Ambulatory Visit (HOSPITAL_COMMUNITY): Payer: Self-pay

## 2021-07-27 DIAGNOSIS — R0603 Acute respiratory distress: Secondary | ICD-10-CM | POA: Diagnosis not present

## 2021-07-27 DIAGNOSIS — K810 Acute cholecystitis: Secondary | ICD-10-CM | POA: Diagnosis not present

## 2021-07-27 DIAGNOSIS — K8050 Calculus of bile duct without cholangitis or cholecystitis without obstruction: Secondary | ICD-10-CM | POA: Diagnosis not present

## 2021-07-27 LAB — BASIC METABOLIC PANEL
Anion gap: 7 (ref 5–15)
BUN: 45 mg/dL — ABNORMAL HIGH (ref 8–23)
CO2: 30 mmol/L (ref 22–32)
Calcium: 8.5 mg/dL — ABNORMAL LOW (ref 8.9–10.3)
Chloride: 100 mmol/L (ref 98–111)
Creatinine, Ser: 1.6 mg/dL — ABNORMAL HIGH (ref 0.61–1.24)
GFR, Estimated: 47 mL/min — ABNORMAL LOW (ref >60)
Glucose, Bld: 139 mg/dL — ABNORMAL HIGH (ref 70–99)
Potassium: 4.3 mmol/L (ref 3.5–5.1)
Sodium: 137 mmol/L (ref 135–145)

## 2021-07-27 LAB — GLUCOSE, CAPILLARY
Glucose-Capillary: 114 mg/dL — ABNORMAL HIGH (ref 70–99)
Glucose-Capillary: 128 mg/dL — ABNORMAL HIGH (ref 70–99)
Glucose-Capillary: 130 mg/dL — ABNORMAL HIGH (ref 70–99)
Glucose-Capillary: 176 mg/dL — ABNORMAL HIGH (ref 70–99)

## 2021-07-27 LAB — CBC
HCT: 33.1 % — ABNORMAL LOW (ref 39.0–52.0)
Hemoglobin: 9.6 g/dL — ABNORMAL LOW (ref 13.0–17.0)
MCH: 25.5 pg — ABNORMAL LOW (ref 26.0–34.0)
MCHC: 29 g/dL — ABNORMAL LOW (ref 30.0–36.0)
MCV: 87.8 fL (ref 80.0–100.0)
Platelets: 111 10*3/uL — ABNORMAL LOW (ref 150–400)
RBC: 3.77 MIL/uL — ABNORMAL LOW (ref 4.22–5.81)
RDW: 24 % — ABNORMAL HIGH (ref 11.5–15.5)
WBC: 12.3 10*3/uL — ABNORMAL HIGH (ref 4.0–10.5)
nRBC: 1.1 % — ABNORMAL HIGH (ref 0.0–0.2)

## 2021-07-27 LAB — PROTEIN ELECTROPHORESIS, SERUM
A/G Ratio: 0.8 (ref 0.7–1.7)
Albumin ELP: 2.6 g/dL — ABNORMAL LOW (ref 2.9–4.4)
Alpha-1-Globulin: 0.4 g/dL (ref 0.0–0.4)
Alpha-2-Globulin: 0.9 g/dL (ref 0.4–1.0)
Beta Globulin: 0.9 g/dL (ref 0.7–1.3)
Gamma Globulin: 0.9 g/dL (ref 0.4–1.8)
Globulin, Total: 3.1 g/dL (ref 2.2–3.9)
Total Protein ELP: 5.7 g/dL — ABNORMAL LOW (ref 6.0–8.5)

## 2021-07-27 MED ORDER — NAPHAZOLINE-GLYCERIN 0.012-0.25 % OP SOLN
1.0000 [drp] | Freq: Four times a day (QID) | OPHTHALMIC | Status: DC | PRN
  Administered 2021-08-01: 1 [drp] via OPHTHALMIC
  Administered 2021-08-03: 2 [drp] via OPHTHALMIC
  Filled 2021-07-27: qty 15

## 2021-07-27 NOTE — Care Management Important Message (Signed)
Important Message  Patient Details IM Letter given to the Patient. Name: [name] [name] MRN: [id number] Date of Birth: 1951-04-21   Medicare Important Message Given:  Yes     Kerin Salen 07/27/2021, 11:51 AM

## 2021-07-27 NOTE — NC FL2 (Signed)
[place] MEDICAID FL2 LEVEL OF CARE SCREENING TOOL     IDENTIFICATION  Patient Name: [name] [name] Birthdate: [birth year] Sex: male Admission Date (Current Location): 07/17/2021  [facility] and [place] Number:  [employer] and Address:  [place] [address], [place]      Provider Number: [id number]  Attending Physician Name and Address:  Hosie Poisson, MD  Relative Name and Phone Number:  Mabe,Will Pandora Leiter)   [phone]    Current Level of Care: Hospital Recommended Level of Care: [place] Prior Approval Number:    Date Approved/Denied: 11/18/18 PASRR Number: [id number]  Discharge Plan: SNF    Current Diagnoses: Patient Active Problem List   Diagnosis Date Noted   Acute cholecystitis 07/18/2021   Choledocholithiasis 07/18/2021   Pancreatitis 07/18/2021   Prolonged QT interval 07/18/2021   Diabetes mellitus type 2, uncomplicated ([place]) 99991111   CKD (chronic kidney disease) stage 3, GFR 30-59 ml/min ([place]) 07/18/2021   Thrombocytopenia ([place]) 07/18/2021   Leukocytosis 99991111   Chronic systolic CHF (congestive heart failure) ([place]) 07/18/2021   Hyperbilirubinemia 07/18/2021   Acute on chronic congestive heart failure ([place])    Acute on chronic systolic CHF (congestive heart failure) ([place]) 05/04/2021   History of kidney stones    Status post total left knee replacement 12/03/2020   Arthritis of left knee 12/02/2020   Renal insufficiency 08/12/2020   Ischemic cardiomyopathy 08/12/2020   CAD (coronary artery disease)    DM (diabetes mellitus) ([place])    HTN (hypertension)    Hyperlipidemia    Diabetes mellitus due to underlying condition with unspecified complications ([place]) 0000000   Paroxysmal atrial fibrillation ([place]) 07/16/2020   Erythrocytosis 06/01/2020   Unilateral primary osteoarthritis, right knee 11/15/2018   Status post total right knee replacement 11/15/2018   Depressive disorder 11/12/2018    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   History of colonic polyps 11/12/2018   History of nephrolithiasis 11/12/2018   History of smoking 11/12/2018   Inguinal hernia without mention of obstruction or gangrene, recurrent unilateral or unspecified 11/12/2018   Medication intolerance 11/12/2018   Old myocardial infarction 11/12/2018   Mixed hyperlipidemia 11/12/2018   Pre-operative cardiovascular examination 11/12/2018   CAD (coronary artery disease), native coronary artery 11/12/2018   Bilateral leg edema 05/04/2017   Primary osteoarthritis of both knees 03/05/2017   CAP (community acquired pneumonia) 08/17/2016   Right carotid bruit 08/05/2014   HYPERTENSION, BENIGN 06/15/2009   CAD, NATIVE VESSEL 06/15/2009   Type II or unspecified type diabetes mellitus without mention of complication, not stated as uncontrolled 10/14/2007   Gout, unspecified 10/14/2007   Essential hypertension 10/14/2007    Orientation RESPIRATION BLADDER Height & Weight     Self, Time, Situation, Place  Normal Continent Weight: 213 lb 10 oz (96.9 kg) Height:  '6\' 1"'$  (185.4 cm)  BEHAVIORAL SYMPTOMS/MOOD NEUROLOGICAL BOWEL NUTRITION STATUS      Incontinent Diet (Diet regular Room service appropriate? Yes; Fluid consistency: Thin)  AMBULATORY STATUS COMMUNICATION OF NEEDS Skin   Extensive Assist Verbally Other (Comment) (Abbrasion and dermatis right buttock Ecchymosis Bilateral)                       Personal Care Assistance Level of Assistance  Bathing, Feeding, Dressing Bathing Assistance: Maximum assistance Feeding assistance: Independent Dressing Assistance: Maximum assistance     Functional Limitations Info  Sight, Hearing, Speech Sight Info: Impaired Hearing Info: Adequate Speech Info: Adequate  SPECIAL CARE FACTORS FREQUENCY  PT (By licensed PT)     PT Frequency: 5x              Contractures Contractures Info: Not present    Additional Factors Info  Code  Status, Allergies Code Status Info: Full Allergies Info: Allopurinol  Aripiprazole  Bupropion  Buspirone  Desvenlafaxine  Duloxetine  Escitalopram  Fish Oil  Fluticasone Furoate  Hydroxyzine Hcl  Nortriptyline  Paroxetine Hcl  Ramipril  Sertraline  Venlafaxine  Vilazodone           Current Medications (07/27/2021):  This is the current hospital active medication list Current Facility-Administered Medications  Medication Dose Route Frequency Provider Last Rate Last Admin   0.9 %  sodium chloride infusion  250 mL Intravenous Continuous Lenis Noon, RPH       acetaminophen (TYLENOL) tablet 650 mg  650 mg Oral Q6H PRN Oswald Hillock, MD   650 mg at 07/25/21 1506   Or   acetaminophen (TYLENOL) suppository 650 mg  650 mg Rectal Q6H PRN Oswald Hillock, MD       alum & mag hydroxide-simeth (MAALOX/MYLANTA) 200-200-20 MG/5ML suspension 30 mL  30 mL Oral Q6H PRN Oswald Hillock, MD   30 mL at 07/25/21 2147   Chlorhexidine Gluconate Cloth 2 % PADS 6 each  6 each Topical Daily Hunsucker, Bonna Gains, MD   6 each at 07/27/21 1247   HYDROmorphone (DILAUDID) injection 1 mg  1 mg Intravenous Q3H PRN Oswald Hillock, MD   1 mg at 07/27/21 1342   insulin aspart (novoLOG) injection 0-9 Units  0-9 Units Subcutaneous Q6H PRN Oswald Hillock, MD   1 Units at 07/27/21 1248   lactated ringers bolus 500 mL  500 mL Intravenous Once Hunsucker, Bonna Gains, MD       MEDLINE mouth rinse  15 mL Mouth Rinse BID Hunsucker, Bonna Gains, MD   15 mL at 07/27/21 1006   norepinephrine (LEVOPHED) '4mg'$  in 25m premix infusion  2-10 mcg/min Intravenous Titrated SLenis Noon RPH 7.5 mL/hr at 07/26/21 0955 2 mcg/min at 07/26/21 0955   ondansetron (ZOFRAN) injection 4 mg  4 mg Intravenous Q6H PRN LOswald Hillock MD   4 mg at 07/22/21 1440   [START ON 07/28/2021] pantoprazole (PROTONIX) injection 40 mg  40 mg Intravenous Q12H JMilus Banister MD       pantoprozole (PROTONIX) 80 mg /NS 100 mL infusion  8 mg/hr Intravenous Continuous JMilus Banister MD 10 mL/hr at 07/27/21 1353 8 mg/hr at 07/27/21 1353   rosuvastatin (CRESTOR) tablet 40 mg  40 mg Oral QHS LOswald Hillock MD   40 mg at 07/26/21 2300   vancomycin (VANCOCIN) capsule 125 mg  125 mg Oral QID LOswald Hillock MD   125 mg at 07/27/21 1336   Zinc Oxide (TRIPLE PASTE) 12.8 % ointment   Topical PRN Hunsucker, MBonna Gains MD         Discharge Medications: Please see discharge summary for a list of discharge medications.  Relevant Imaging Results:  Relevant Lab Results:   Additional Information Pt SSN [id number] BNatasha Bence LCSW

## 2021-07-27 NOTE — Clinical Social Work Note (Signed)
Patient's son reported that he has not been able to visit his father to assist with Coordinating care due to visitation policy. Patient's son reports that he is the [facility] POA. RN informed patient's son that signed documentation of [place] will need to be provided. TOC supervisor, MD, and Floor supervisor notified.

## 2021-07-27 NOTE — TOC Initial Note (Addendum)
Transition of Care [facility]) - Initial/Assessment Note    Patient Details  Name: [name] [name] MRN: [id number] Date of Birth: 1951-05-21  Transition of Care [facility]) CM/SW Contact:    Natasha Bence, LCSW Phone Number: 07/27/2021, 3:57 PM  Clinical Narrative:                 Patient is a 70 year old male admitted for Acute cholecystitis. CSW observed patient's high readmission score. CSW conducted readmission risk assessment and initial assessment. Patient's son reported that prior to admission, patient was able to ambulate independently majority of the time, but had begun to utilize RW periodically a few weeks prior to admission. Patient was also able to perform all ADL's independently. Patient's son reported that patient lives home alone. Patient's son agreeable to local SNF referrals. Patient not agreeable to [place] faxed patient to local SNF's. Patient's son reported that he believed patient was fully vaccinated for Covid 19, but will search for documentation. TOC to follow.  Expected Discharge Plan: Skilled Nursing Facility Barriers to Discharge: Continued Medical Work up   Patient Goals and CMS Choice Patient states their goals for this hospitalization and ongoing recovery are:: Rehab CMS Medicare.gov Compare Post Acute Care list provided to:: Patient Choice offered to / list presented to : Patient  Expected Discharge Plan and Services Expected Discharge Plan: [place]       Living arrangements for the past 2 months: Single Family Home                                      Prior Living Arrangements/Services Living arrangements for the past 2 months: Single Family Home Lives with:: Self Patient language and need for interpreter reviewed:: Yes Do you feel safe going back to the place where you live?: Yes      Need for Family Participation in Patient Care: Yes (Comment) Care giver support system in place?: Yes (comment)   Criminal  Activity/Legal Involvement Pertinent to Current Situation/Hospitalization: No - Comment as needed  Activities of Daily Living Home Assistive Devices/Equipment: Walker (specify type), Cane (specify quad or straight) ADL Screening (condition at time of admission) Patient's cognitive ability adequate to safely complete daily activities?: Yes Is the patient deaf or have difficulty hearing?: No Does the patient have difficulty seeing, even when wearing glasses/contacts?: No Does the patient have difficulty concentrating, remembering, or making decisions?: No Patient able to express need for assistance with ADLs?: Yes Does the patient have difficulty dressing or bathing?: Yes Independently performs ADLs?: Yes (appropriate for developmental age) Does the patient have difficulty walking or climbing stairs?: Yes Weakness of Legs: Both Weakness of Arms/Hands: None  Permission Sought/Granted Permission sought to share information with : Family Supports Permission granted to share information with : Yes, Verbal Permission Granted  Share Information with NAME: Mabe,Will  Permission granted to share info w AGENCY: Local SNF  Permission granted to share info w Relationship: (Son)  Permission granted to share info w Contact Information: [phone]  Emotional Assessment     Affect (typically observed): Accepting Orientation: : Oriented to Self, Oriented to Place, Oriented to  Time, Oriented to Situation Alcohol / Substance Use: Not Applicable Psych Involvement: No (comment)  Admission diagnosis:  Acute cholecystitis [K81.0] Choledocholithiasis [K80.50] Abdominal pain [R10.9] Biliary calculus of other site without obstruction [K80.80] Hematuria, unspecified type [R31.9] Patient Active Problem List   Diagnosis Date Noted  Acute cholecystitis 07/18/2021   Choledocholithiasis 07/18/2021   Pancreatitis 07/18/2021   Prolonged QT interval 07/18/2021   Diabetes mellitus type 2, uncomplicated  ([employer]) [id number]   CKD (chronic kidney disease) stage 3, GFR 30-59 ml/min (H[facility]) 07/18/2021   Thrombocytopenia ([place]) 07/18/2021   Leukocytosis 99991111   Chronic systolic CHF (congestive heart failure) ([place]) 07/18/2021   Hyperbilirubinemia 07/18/2021   Acute on chronic congestive heart failure ([place])    Acute on chronic systolic CHF (congestive heart failure) ([place]) 05/04/2021   History of kidney stones    Status post total left knee replacement 12/03/2020   Arthritis of left knee 12/02/2020   Renal insufficiency 08/12/2020   Ischemic cardiomyopathy 08/12/2020   CAD (coronary artery disease)    DM (diabetes mellitus) ([place])    HTN (hypertension)    Hyperlipidemia    Diabetes mellitus due to underlying condition with unspecified complications ([place]) 0000000   Paroxysmal atrial fibrillation ([place]) 07/16/2020   Erythrocytosis 06/01/2020   Unilateral primary osteoarthritis, right knee 11/15/2018   Status post total right knee replacement 11/15/2018   Depressive disorder 11/12/2018   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   History of colonic polyps 11/12/2018   History of nephrolithiasis 11/12/2018   History of smoking 11/12/2018   Inguinal hernia without mention of obstruction or gangrene, recurrent unilateral or unspecified 11/12/2018   Medication intolerance 11/12/2018   Old myocardial infarction 11/12/2018   Mixed hyperlipidemia 11/12/2018   Pre-operative cardiovascular examination 11/12/2018   CAD (coronary artery disease), native coronary artery 11/12/2018   Bilateral leg edema 05/04/2017   Primary osteoarthritis of both knees 03/05/2017   CAP (community acquired pneumonia) 08/17/2016   Right carotid bruit 08/05/2014   HYPERTENSION, BENIGN 06/15/2009   CAD, NATIVE VESSEL 06/15/2009   Type II or unspecified type diabetes mellitus without mention of complication, not stated as uncontrolled 10/14/2007   Gout, unspecified 10/14/2007   Essential hypertension  10/14/2007   PCP:  Raeanne Gathers, MD Pharmacy:   [id number] [place], [place] - [address] [place] [address] Phone: [phone] Fax: [phone]     Social Determinants of Health (SDOH) Interventions    Readmission Risk Interventions Readmission Risk Prevention Plan 07/27/2021  Transportation Screening Complete  PCP or Specialist Appt within 3-5 Days Complete  HRI or [place] Complete  Social Work Consult for [place] Planning/Counseling Complete  Palliative Care Screening Complete  Medication Review Press photographer) Complete  Some recent data might be hidden

## 2021-07-27 NOTE — Progress Notes (Signed)
PROGRESS NOTE    [name] [name]  [id number] DOB: 04-Apr-1951 DOA: 07/17/2021 PCP: Raeanne Gathers, MD    Chief Complaint  Patient presents with   Abdominal Pain    Brief Narrative:  70 year old gentleman prior history of hypertension, diabetes, chronic systolic heart failure, stage IIIa CKD, paroxysmal atrial fibrillation, coronary artery disease presents with abdominal pain and diarrhea patient was found to have acute cholecystitis with choledocholithiasis and mild pancreatitis.  He was started on broad-spectrum antibiotics and general surgery, GI consulted.  In view of increased surgical risk patient did not undergo a cholecystectomy.  He was treated conservatively with antibiotics.  Hospital course complicated by diarrhea which was found to be C. difficile diarrhea, GI bleed probably secondary to Eliquis.  Patient received Kcentra for Eliquis reversal, GI consulted underwent EGD found to have a bleeding vessel at the sphincterotomy site which was endoclipped on 07/24/2021.  He was transferred to ICU for hemorrhagic shock briefly requiring Levophed.  He is currently off all pressors.  Assessment & Plan:   Principal Problem:   Acute cholecystitis Active Problems:   Essential hypertension   Paroxysmal atrial fibrillation (HCC)   Choledocholithiasis   Pancreatitis   Prolonged QT interval   Diabetes mellitus type 2, uncomplicated (HCC)   CKD (chronic kidney disease) stage 3, GFR 30-59 ml/min (HCC)   Thrombocytopenia (HCC)   Leukocytosis   Chronic systolic CHF (congestive heart failure) (HCC)   Hyperbilirubinemia   Acute cholecystitis/choledocholithiasis/pain biliary pancreatitis S/p ERCP showing choledocholithiasis underwent stone removal with sphincterotomy.  Cholecystectomy was scheduled however it was canceled as cardiology could not clear him for surgery.  General surgery has signed off and recommended outpatient follow-up in the office. Patient has received 3 days of IV  Zosyn which was discontinued followed by oral Augmentin for 2 more days.   Mild acute on chronic combined systolic and diastolic heart failure Echocardiogram showed left ventricular ejection fraction of 20 to 25% with severe right ventricular dysfunction Cardiology on board and has ordered a PYP scan to evaluate for amyloid, SPEP, UPEP and light chains have been ordered. He was initially started on IV Lasix for fluid overload but has been discontinued due to worsening renal parameters    Mild thrombocytopenia.    Lower GI bleed in the setting of C. difficile colitis Patient was on Eliquis for anticoagulation for atrial fibrillation which was reversed with Kcentra.  GI consulted patient underwent EGD was found to have a bleeding all vessel at the sphincterotomy site underwent Endo clipping of the bleeding vessel.  Patient was transferred to ICU for hemorrhagic shock and was on Levophed briefly.  Patient currently off all pressors and hemoglobin stable between 8 and 9.    C. difficile diarrhea Improving and currently on oral vancomycin.    Type 2 diabetes mellitus Continue with sliding scale insulin at this time.     mild acute on stage IIIb CKD Improving renal parameters with hydration.     Paroxysmal atrial fibrillation Rate controlled and on Eliquis which has been on hold for now.    Coronary artery disease s/p NSTEMI in 2008 s/p stent to RCA and LAD Continue with statin at this time    In view of multiple medical problems , deconditioning and debility will request palliative care for goals of care discussion.    DVT prophylaxis: SCDs Code Status: Full code Family Communication: (None at bedside) Disposition:   Status is: Inpatient  Remains inpatient appropriate because:Ongoing diagnostic testing needed not appropriate for outpatient work  up, Unsafe d/c plan, and IV treatments appropriate due to intensity of illness or inability to take PO  Dispo: The  patient is from: Home              Anticipated d/c is to: SNF              Patient currently is not medically stable to d/c.   Difficult to place patient No       Consultants:  Critical care PCCM  Cardiology Gastroenterology Procedures: .none.  Antimicrobials:  Antibiotics Given (last 72 hours)     Date/Time Action Medication Dose Rate   07/24/21 1649 New Bag/Given   metroNIDAZOLE (FLAGYL) IVPB 500 mg 500 mg 100 mL/hr   07/25/21 0014 New Bag/Given   metroNIDAZOLE (FLAGYL) IVPB 500 mg 500 mg 100 mL/hr   07/25/21 [id number] New Bag/Given   metroNIDAZOLE (FLAGYL) IVPB 500 mg 500 mg 100 mL/hr   07/25/21 1054 Given   vancomycin (VANCOCIN) capsule 125 mg 125 mg    07/25/21 1311 Given   vancomycin (VANCOCIN) capsule 125 mg 125 mg    07/25/21 1550 New Bag/Given   metroNIDAZOLE (FLAGYL) IVPB 500 mg 500 mg 100 mL/hr   07/25/21 1755 Given   vancomycin (VANCOCIN) capsule 125 mg 125 mg    07/25/21 2147 Given   vancomycin (VANCOCIN) capsule 125 mg 125 mg    07/25/21 2359 New Bag/Given   metroNIDAZOLE (FLAGYL) IVPB 500 mg 500 mg 100 mL/hr   07/26/21 0844 Given   vancomycin (VANCOCIN) capsule 125 mg 125 mg    07/26/21 0846 New Bag/Given   metroNIDAZOLE (FLAGYL) IVPB 500 mg 500 mg 100 mL/hr   07/26/21 1501 Given   vancomycin (VANCOCIN) capsule 125 mg 125 mg    07/26/21 1510 New Bag/Given   metroNIDAZOLE (FLAGYL) IVPB 500 mg 500 mg 100 mL/hr   07/26/21 1825 Given   vancomycin (VANCOCIN) capsule 125 mg 125 mg    07/26/21 2300 Given   vancomycin (VANCOCIN) capsule 125 mg 125 mg    07/27/21 0012 New Bag/Given   metroNIDAZOLE (FLAGYL) IVPB 500 mg 500 mg 100 mL/hr   07/27/21 0759 New Bag/Given   metroNIDAZOLE (FLAGYL) IVPB 500 mg 500 mg 100 mL/hr   07/27/21 1004 Given   vancomycin (VANCOCIN) capsule 125 mg 125 mg    07/27/21 1336 Given   vancomycin (VANCOCIN) capsule 125 mg 125 mg          Subjective: Mild abdominal pain, no vomiting or diarrhea.   Objective: Vitals:   07/26/21  2327 07/27/21 0500 07/27/21 0525 07/27/21 1306  BP: 118/72  118/69 104/62  Pulse: 63  62 (!) 53  Resp: 18  20   Temp:    98 F (36.7 C)  TempSrc:    Oral  SpO2: 99%  99% 96%  Weight:  96.9 kg    Height:        Intake/Output Summary (Last 24 hours) at 07/27/2021 1356 Last data filed at 07/27/2021 0759 Gross per 24 hour  Intake 480 ml  Output 1200 ml  Net -720 ml   Filed Weights   07/24/21 0500 07/24/21 1615 07/27/21 0500  Weight: 85.3 kg 86.9 kg 96.9 kg    Examination:  General exam: Appears calm and comfortable  Respiratory system: Clear to auscultation. Respiratory effort normal. Cardiovascular system: S1 & S2 heard, RRR.  No pedal edema. Gastrointestinal system: Abdomen is nondistended, soft and nontender.  Normal bowel sounds heard. Central nervous system: Alert and oriented. No focal neurological deficits. Extremities:  cold extremities,  Skin: No rashes, lesions or ulcers Psychiatry: Mood & affect appropriate.     Data Reviewed: I have personally reviewed following labs and imaging studies  CBC: Recent Labs  Lab 07/23/21 2102 07/24/21 0518 07/24/21 1906 07/25/21 0246 07/25/21 1351 07/25/21 2219 07/27/21 0730  WBC 14.1*   < > 21.1* 15.9* 16.6* 14.4* 12.3*  NEUTROABS 12.2*  --   --   --   --   --   --   HGB 13.3   < > 9.6* 9.8* 9.4* 9.1* 9.6*  HCT 42.0   < > 31.0* 30.7* 29.7* 28.4* 33.1*  MCV 80.3   < > 82.2 81.6 83.2 82.3 87.8  PLT 112*   < > 121* 107* 125* 128* 111*   < > = values in this interval not displayed.    Basic Metabolic Panel: Recent Labs  Lab 07/21/21 0541 07/22/21 0538 07/23/21 0555 07/24/21 0518 07/25/21 0246 07/25/21 2219 07/27/21 0936  NA 139 138 140 139 141 139 137  K 3.8 3.6 4.2 4.2 4.5 4.1 4.3  CL 100 98 104 102 103 103 100  CO2 '28 26 26 28 '$ 32 28 30  GLUCOSE 149* 156* 149* 131* 200* 165* 139*  BUN 53* 69* 78* 76* 79* 69* 45*  CREATININE 2.65* 2.30* 2.05* 2.10* 2.11* 1.92* 1.60*  CALCIUM 9.6 9.3 9.2 8.4* 9.1 8.6* 8.5*  MG  2.2 1.9 2.1  --   --  2.2  --     GFR: Estimated Creatinine Clearance: 54.2 mL/min (A) (by C-G formula based on SCr of 1.6 mg/dL (H)).  Liver Function Tests: No results for input(s): AST, ALT, ALKPHOS, BILITOT, PROT, ALBUMIN in the last 168 hours.  CBG: Recent Labs  Lab 07/26/21 1209 07/26/21 1724 07/26/21 2328 07/27/21 0528 07/27/21 1218  GLUCAP 203* 166* 143* 114* 130*     Recent Results (from the past 240 hour(s))  Resp Panel by RT-PCR (Flu A&B, Covid) Nasopharyngeal Swab     Status: None   Collection Time: 07/17/21  8:01 PM   Specimen: Nasopharyngeal Swab; Nasopharyngeal(NP) swabs in vial transport medium  Result Value Ref Range Status   SARS Coronavirus 2 by RT PCR NEGATIVE NEGATIVE Final    Comment: (NOTE) SARS-CoV-2 target nucleic acids are NOT DETECTED.  The SARS-CoV-2 RNA is generally detectable in upper respiratory specimens during the acute phase of infection. The lowest concentration of SARS-CoV-2 viral copies this assay can detect is 138 copies/mL. A negative result does not preclude SARS-Cov-2 infection and should not be used as the sole basis for treatment or other patient management decisions. A negative result may occur with  improper specimen collection/handling, submission of specimen other than nasopharyngeal swab, presence of viral mutation(s) within the areas targeted by this assay, and inadequate number of viral copies(<138 copies/mL). A negative result must be combined with clinical observations, patient history, and epidemiological information. The expected result is Negative.  Fact Sheet for Patients:  EntrepreneurPulse.com.au  Fact Sheet for Healthcare Providers:  IncredibleEmployment.be  This test is no t yet approved or cleared by the [place] FDA and  has been authorized for detection and/or diagnosis of SARS-CoV-2 by FDA under an Emergency Use Authorization (EUA). This EUA will remain  in effect  (meaning this test can be used) for the duration of the COVID-19 declaration under Section 564(b)(1) of the Act, 21 U.S.C.section 360bbb-3(b)(1), unless the authorization is terminated  or revoked sooner.       Influenza A by PCR NEGATIVE NEGATIVE Final  Influenza B by PCR NEGATIVE NEGATIVE Final    Comment: (NOTE) The Xpert Xpress SARS-CoV-2/FLU/RSV plus assay is intended as an aid in the diagnosis of influenza from Nasopharyngeal swab specimens and should not be used as a sole basis for treatment. Nasal washings and aspirates are unacceptable for Xpert Xpress SARS-CoV-2/FLU/RSV testing.  Fact Sheet for Patients: EntrepreneurPulse.com.au  Fact Sheet for Healthcare Providers: IncredibleEmployment.be  This test is not yet approved or cleared by the [place] FDA and has been authorized for detection and/or diagnosis of SARS-CoV-2 by FDA under an Emergency Use Authorization (EUA). This EUA will remain in effect (meaning this test can be used) for the duration of the COVID-19 declaration under Section 564(b)(1) of the Act, 21 U.S.C. section 360bbb-3(b)(1), unless the authorization is terminated or revoked.  Performed at [facility], [place] [address] [place], [place] [address]   Urine Culture     Status: None   Collection Time: 07/17/21 11:58 PM   Specimen: Urine, Clean Catch  Result Value Ref Range Status   Specimen Description   Final    URINE, CLEAN CATCH Performed at [facility], [place] [address], [place], [place] [address]    Special Requests   Final    NONE Performed at [facility], [place] [address], [place], [place] [address]    Culture   Final    NO GROWTH Performed at [facility], [place] [address] [place], [place] [address]    Report Status 07/19/2021 FINAL  Final  Blood culture (routine x 2)     Status: None   Collection Time: 07/18/21  2:10 AM   Specimen:  BLOOD  Result Value Ref Range Status   Specimen Description   Final    BLOOD LEFT ANTECUBITAL Performed at [place] [address], [place], [place] [address]    Special Requests   Final    BOTTLES DRAWN AEROBIC ONLY Blood Culture adequate volume Performed at [place] [address], [place], [place] [address]    Culture   Final    NO GROWTH 5 DAYS Performed at [facility], [place] [address] [place], [place] [address]    Report Status 07/23/2021 FINAL  Final  Blood culture (routine x 2)     Status: None   Collection Time: 07/18/21  2:10 AM   Specimen: BLOOD  Result Value Ref Range Status   Specimen Description   Final    BLOOD BLOOD RIGHT FOREARM Performed at [place] [address] [place], [place] [address]    Special Requests   Final    BOTTLES DRAWN AEROBIC ONLY Blood Culture adequate volume Performed at [place] [address] [place], [place] [address]    Culture   Final    NO GROWTH 5 DAYS Performed at [facility], [place] [address], [place], [place] [address]    Report Status 07/23/2021 FINAL  Final  Surgical pcr screen     Status: None   Collection Time: 07/20/21  9:05 AM   Specimen: Nasal Mucosa; Nasal Swab  Result Value Ref Range Status   MRSA, PCR NEGATIVE NEGATIVE Final   Staphylococcus aureus NEGATIVE NEGATIVE Final    Comment: (NOTE) The Xpert SA Assay (FDA approved for NASAL specimens in patients 94 years of age and older), is one component of a comprehensive surveillance program. It is not intended to diagnose infection nor to guide or monitor  treatment. Performed at [facility], [place] [address], [place], [place] [address]   C Difficile Quick Screen (NO PCR Reflex)     Status: Abnormal   Collection Time: 07/23/21  9:20 AM   Specimen: STOOL  Result Value Ref Range Status   C Diff antigen POSITIVE (A) NEGATIVE Final   C Diff  toxin POSITIVE (A) NEGATIVE Final   C Diff interpretation Toxin producing C. difficile detected.  Final    Comment: CRITICAL RESULT CALLED TO, READ BACK BY AND VERIFIED WITH: Joeseph Amor RN Performed at [facility] [address], [place], [place] [address]   MRSA Next Gen by PCR, Nasal     Status: None   Collection Time: 07/24/21  3:27 PM   Specimen: Nasal Mucosa; Nasal Swab  Result Value Ref Range Status   MRSA by PCR Next Gen NOT DETECTED NOT DETECTED Final    Comment: (NOTE) The GeneXpert MRSA Assay (FDA approved for NASAL specimens only), is one component of a comprehensive MRSA colonization surveillance program. It is not intended to diagnose MRSA infection nor to guide or monitor treatment for MRSA infections. Test performance is not FDA approved in patients less than 17 years old. Performed at [facility], [place] [address], [place], [place] [address]          Radiology Studies: No results found.      Scheduled Meds:  Chlorhexidine Gluconate Cloth  6 each Topical Daily   mouth rinse  15 mL Mouth Rinse BID   [START ON 07/28/2021] pantoprazole  40 mg Intravenous Q12H   rosuvastatin  40 mg Oral QHS   vancomycin  125 mg Oral QID   Continuous Infusions:  sodium chloride     lactated ringers     norepinephrine (LEVOPHED) Adult infusion 2 mcg/min (07/26/21 0955)   pantoprazole 8 mg/hr (07/27/21 1353)     LOS: 9 days        Hosie Poisson, MD [facility]   To contact the attending provider between 7A-7P or the covering provider during after hours 7P-7A, please log into the web site [email or web address] and access using universal [place] password for that web site. If you do not have the password, please call the hospital operator.  07/27/2021, 1:56 PM

## 2021-07-28 ENCOUNTER — Inpatient Hospital Stay (HOSPITAL_COMMUNITY): Payer: Medicare Other

## 2021-07-28 DIAGNOSIS — K810 Acute cholecystitis: Secondary | ICD-10-CM | POA: Diagnosis not present

## 2021-07-28 DIAGNOSIS — K8050 Calculus of bile duct without cholangitis or cholecystitis without obstruction: Secondary | ICD-10-CM | POA: Diagnosis not present

## 2021-07-28 DIAGNOSIS — R0603 Acute respiratory distress: Secondary | ICD-10-CM | POA: Diagnosis not present

## 2021-07-28 LAB — HEPATIC FUNCTION PANEL
ALT: 25 U/L (ref 0–44)
AST: 39 U/L (ref 15–41)
Albumin: 2.4 g/dL — ABNORMAL LOW (ref 3.5–5.0)
Alkaline Phosphatase: 316 U/L — ABNORMAL HIGH (ref 38–126)
Bilirubin, Direct: 0.9 mg/dL — ABNORMAL HIGH (ref 0.0–0.2)
Indirect Bilirubin: 1.1 mg/dL — ABNORMAL HIGH (ref 0.3–0.9)
Total Bilirubin: 2 mg/dL — ABNORMAL HIGH (ref 0.3–1.2)
Total Protein: 5.6 g/dL — ABNORMAL LOW (ref 6.5–8.1)

## 2021-07-28 LAB — CBC
HCT: 28.6 % — ABNORMAL LOW (ref 39.0–52.0)
Hemoglobin: 8.8 g/dL — ABNORMAL LOW (ref 13.0–17.0)
MCH: 26 pg (ref 26.0–34.0)
MCHC: 30.8 g/dL (ref 30.0–36.0)
MCV: 84.6 fL (ref 80.0–100.0)
Platelets: 144 10*3/uL — ABNORMAL LOW (ref 150–400)
RBC: 3.38 MIL/uL — ABNORMAL LOW (ref 4.22–5.81)
RDW: 22.6 % — ABNORMAL HIGH (ref 11.5–15.5)
WBC: 8.9 10*3/uL (ref 4.0–10.5)
nRBC: 0.8 % — ABNORMAL HIGH (ref 0.0–0.2)

## 2021-07-28 LAB — LIPASE, BLOOD: Lipase: 153 U/L — ABNORMAL HIGH (ref 11–51)

## 2021-07-28 LAB — BASIC METABOLIC PANEL
Anion gap: 4 — ABNORMAL LOW (ref 5–15)
BUN: 37 mg/dL — ABNORMAL HIGH (ref 8–23)
CO2: 32 mmol/L (ref 22–32)
Calcium: 8.7 mg/dL — ABNORMAL LOW (ref 8.9–10.3)
Chloride: 106 mmol/L (ref 98–111)
Creatinine, Ser: 1.69 mg/dL — ABNORMAL HIGH (ref 0.61–1.24)
GFR, Estimated: 44 mL/min — ABNORMAL LOW (ref >60)
Glucose, Bld: 135 mg/dL — ABNORMAL HIGH (ref 70–99)
Potassium: 4.3 mmol/L (ref 3.5–5.1)
Sodium: 142 mmol/L (ref 135–145)

## 2021-07-28 LAB — HEMOGLOBIN AND HEMATOCRIT, BLOOD
HCT: 29.8 % — ABNORMAL LOW (ref 39.0–52.0)
Hemoglobin: 9.1 g/dL — ABNORMAL LOW (ref 13.0–17.0)

## 2021-07-28 LAB — GLUCOSE, CAPILLARY
Glucose-Capillary: 108 mg/dL — ABNORMAL HIGH (ref 70–99)
Glucose-Capillary: 108 mg/dL — ABNORMAL HIGH (ref 70–99)
Glucose-Capillary: 123 mg/dL — ABNORMAL HIGH (ref 70–99)

## 2021-07-28 LAB — AMMONIA: Ammonia: 19 umol/L (ref 9–35)

## 2021-07-28 LAB — VITAMIN B12: Vitamin B-12: 5093 pg/mL — ABNORMAL HIGH (ref 180–914)

## 2021-07-28 LAB — TSH: TSH: 8.996 u[IU]/mL — ABNORMAL HIGH (ref 0.350–4.500)

## 2021-07-28 MED ORDER — IOHEXOL 9 MG/ML PO SOLN
500.0000 mL | ORAL | Status: AC
  Administered 2021-07-28 (×2): 500 mL via ORAL

## 2021-07-28 MED ORDER — IOHEXOL 9 MG/ML PO SOLN
ORAL | Status: AC
  Administered 2021-07-28: 500 mL
  Filled 2021-07-28: qty 1000

## 2021-07-28 NOTE — Progress Notes (Signed)
PROGRESS NOTE    [name] [name]  UEA:[id number] DOB: 05/18/1951 DOA: 07/17/2021 PCP: Raeanne Gathers, MD    Chief Complaint  Patient presents with   Abdominal Pain    Brief Narrative:  70 year old gentleman prior history of hypertension, diabetes, chronic systolic heart failure, stage IIIa CKD, paroxysmal atrial fibrillation, coronary artery disease presents with abdominal pain and diarrhea patient was found to have acute cholecystitis with choledocholithiasis and mild pancreatitis.  He was started on broad-spectrum antibiotics and general surgery, GI consulted.  In view of increased surgical risk patient did not undergo a cholecystectomy.  He was treated conservatively with antibiotics.  Hospital course complicated by diarrhea which was found to be C. difficile diarrhea, GI bleed probably secondary to Eliquis.  Patient received Kcentra for Eliquis reversal, GI consulted underwent EGD found to have a bleeding vessel at the sphincterotomy site which was endoclipped on 07/24/2021.  He was transferred to ICU for hemorrhagic shock briefly requiring Levophed.  He is currently off all pressors.  Assessment & Plan:   Principal Problem:   Acute cholecystitis Active Problems:   Essential hypertension   Paroxysmal atrial fibrillation (HCC)   Choledocholithiasis   Pancreatitis   Prolonged QT interval   Diabetes mellitus type 2, uncomplicated (HCC)   CKD (chronic kidney disease) stage 3, GFR 30-59 ml/min (HCC)   Thrombocytopenia (HCC)   Leukocytosis   Chronic systolic CHF (congestive heart failure) (HCC)   Hyperbilirubinemia   Acute cholecystitis/choledocholithiasis/pain biliary pancreatitis S/p ERCP showing choledocholithiasis underwent stone removal with sphincterotomy.  Cholecystectomy was scheduled however it was canceled as cardiology could not clear him for surgery.  General surgery has signed off and recommended outpatient follow-up in the office. Patient has received 3 days of IV  Zosyn which was discontinued followed by oral Augmentin for 2 more days. Pt reports occasional intermittent abd pain, resolves spontaneously. Repeat labs show elevated lipase, elevated alk phos and mild elevation in bilirubin. Will repeat CT abdomen for further evaluation.    Mild acute on chronic combined systolic and diastolic heart failure Echocardiogram showed left ventricular ejection fraction of 20 to 25% with severe right ventricular dysfunction Cardiology on board and has ordered a PYP scan as outpatient to evaluate for amyloid, SPEP, UPEP and light chains have been ordered. He was initially started on IV Lasix for fluid overload but has been discontinued due to worsening renal parameters Pt remains off lasix for now.  Outpatient follow up with cardiology.     Mild thrombocytopenia. Improving.    Lower GI bleed in the setting of C. difficile colitis Patient was on Eliquis for anticoagulation for atrial fibrillation which was reversed with Kcentra.  GI consulted patient underwent EGD was found to have a bleeding all vessel at the sphincterotomy site underwent Endo clipping of the bleeding vessel.  Patient was transferred to ICU for hemorrhagic shock and was on Levophed briefly.  Patient currently off all pressors and hemoglobin stable between 8 and 9.     C. difficile diarrhea Improving and currently on oral vancomycin.    Type 2 diabetes mellitus Continue with sliding scale insulin at this time. CBG (last 3)  Recent Labs    07/27/21 2356 07/28/21 0541 07/28/21 1304  GLUCAP 176* 108* 108*   No changes in meds.      mild acute on stage IIIb CKD Improving renal parameters with hydration. Continue to monitor. No changes.      Paroxysmal atrial fibrillation Rate controlled and on Eliquis which has been on hold for  now.  Will restart the eliquis in 2 days if hemoglobin remains stable.     Coronary artery disease s/p NSTEMI in 2008 s/p stent to RCA and  LAD Continue with statin at this time    In view of multiple medical problems , deconditioning and debility will request palliative care for goals of care discussion.    DVT prophylaxis: SCDs Code Status: Full code Family Communication: (None at bedside) Disposition:   Status is: Inpatient  Remains inpatient appropriate because:Ongoing diagnostic testing needed not appropriate for outpatient work up, Unsafe d/c plan, and IV treatments appropriate due to intensity of illness or inability to take PO  Dispo: The patient is from: Home              Anticipated d/c is to: SNF              Patient currently is not medically stable to d/c.   Difficult to place patient No       Consultants:  Critical care PCCM  Cardiology Gastroenterology Procedures: .none.  Antimicrobials:  Antibiotics Given (last 72 hours)     Date/Time Action Medication Dose Rate   07/25/21 1550 New Bag/Given   metroNIDAZOLE (FLAGYL) IVPB 500 mg 500 mg 100 mL/hr   07/25/21 1755 Given   vancomycin (VANCOCIN) capsule 125 mg 125 mg    07/25/21 2147 Given   vancomycin (VANCOCIN) capsule 125 mg 125 mg    07/25/21 2359 New Bag/Given   metroNIDAZOLE (FLAGYL) IVPB 500 mg 500 mg 100 mL/hr   07/26/21 0844 Given   vancomycin (VANCOCIN) capsule 125 mg 125 mg    07/26/21 0846 New Bag/Given   metroNIDAZOLE (FLAGYL) IVPB 500 mg 500 mg 100 mL/hr   07/26/21 1501 Given   vancomycin (VANCOCIN) capsule 125 mg 125 mg    07/26/21 1510 New Bag/Given   metroNIDAZOLE (FLAGYL) IVPB 500 mg 500 mg 100 mL/hr   07/26/21 1825 Given   vancomycin (VANCOCIN) capsule 125 mg 125 mg    07/26/21 2300 Given   vancomycin (VANCOCIN) capsule 125 mg 125 mg    07/27/21 0012 New Bag/Given   metroNIDAZOLE (FLAGYL) IVPB 500 mg 500 mg 100 mL/hr   07/27/21 0759 New Bag/Given   metroNIDAZOLE (FLAGYL) IVPB 500 mg 500 mg 100 mL/hr   07/27/21 1004 Given   vancomycin (VANCOCIN) capsule 125 mg 125 mg    07/27/21 1336 Given   vancomycin (VANCOCIN)  capsule 125 mg 125 mg    07/27/21 1738 Given   vancomycin (VANCOCIN) capsule 125 mg 125 mg    07/27/21 2119 Given   vancomycin (VANCOCIN) capsule 125 mg 125 mg    07/28/21 1100 Given   vancomycin (VANCOCIN) capsule 125 mg 125 mg          Subjective: Abdominal pain is improving, no nausea vomiting or diarrhea  Objective: Vitals:   07/27/21 0525 07/27/21 1306 07/27/21 2043 07/28/21 0544  BP: 118/69 104/62 102/65 110/64  Pulse: 62 (!) 53 63 62  Resp: 20  14 14   Temp:  98 F (36.7 C) 98.1 F (36.7 C) 97.9 F (36.6 C)  TempSrc:  Oral Oral Oral  SpO2: 99% 96% 99% 98%  Weight:    97 kg  Height:        Intake/Output Summary (Last 24 hours) at 07/28/2021 1344 Last data filed at 07/28/2021 0552 Gross per 24 hour  Intake --  Output 850 ml  Net -850 ml    Filed Weights   07/24/21 1615 07/27/21 0500 07/28/21 0544  Weight: 86.9 kg 96.9 kg 97 kg    Examination:  General exam: Elderly gentleman, ill-appearing, not in any kind of distress Respiratory system: Air entry fair bilateral no wheezing or rhonchi patient remains on room air Cardiovascular system: S1-S2 heard, irregularly irregular, no pedal edema Gastrointestinal system: Abdomen is soft, mild generalized tenderness bowel sounds are normal Central nervous system: Alert and able to answer all questions appropriately, grossly nonfocal Extremities: No pedal edema Skin: No rashes seen Psychiatry: Mood is appropriate    Data Reviewed: I have personally reviewed following labs and imaging studies  CBC: Recent Labs  Lab 07/23/21 2102 07/24/21 0518 07/25/21 0246 07/25/21 1351 07/25/21 2219 07/27/21 0730 07/28/21 0516 07/28/21 1223  WBC 14.1*   < > 15.9* 16.6* 14.4* 12.3* 8.9  --   NEUTROABS 12.2*  --   --   --   --   --   --   --   HGB 13.3   < > 9.8* 9.4* 9.1* 9.6* 8.8* 9.1*  HCT 42.0   < > 30.7* 29.7* 28.4* 33.1* 28.6* 29.8*  MCV 80.3   < > 81.6 83.2 82.3 87.8 84.6  --   PLT 112*   < > 107* 125* 128* 111* 144*   --    < > = values in this interval not displayed.     Basic Metabolic Panel: Recent Labs  Lab 07/22/21 0538 07/23/21 0555 07/24/21 0518 07/25/21 0246 07/25/21 2219 07/27/21 0936 07/28/21 0516  NA 138 140 139 141 139 137 142  K 3.6 4.2 4.2 4.5 4.1 4.3 4.3  CL 98 104 102 103 103 100 106  CO2 26 26 28  32 28 30 32  GLUCOSE 156* 149* 131* 200* 165* 139* 135*  BUN 69* 78* 76* 79* 69* 45* 37*  CREATININE 2.30* 2.05* 2.10* 2.11* 1.92* 1.60* 1.69*  CALCIUM 9.3 9.2 8.4* 9.1 8.6* 8.5* 8.7*  MG 1.9 2.1  --   --  2.2  --   --      GFR: Estimated Creatinine Clearance: 51.3 mL/min (A) (by C-G formula based on SCr of 1.69 mg/dL (H)).  Liver Function Tests: Recent Labs  Lab 07/28/21 1223  AST 39  ALT 25  ALKPHOS 316*  BILITOT 2.0*  PROT 5.6*  ALBUMIN 2.4*    CBG: Recent Labs  Lab 07/27/21 1218 07/27/21 1712 07/27/21 2356 07/28/21 0541 07/28/21 1304  GLUCAP 130* 128* 176* 108* 108*      Recent Results (from the past 240 hour(s))  Surgical pcr screen     Status: None   Collection Time: 07/20/21  9:05 AM   Specimen: Nasal Mucosa; Nasal Swab  Result Value Ref Range Status   MRSA, PCR NEGATIVE NEGATIVE Final   Staphylococcus aureus NEGATIVE NEGATIVE Final    Comment: (NOTE) The Xpert SA Assay (FDA approved for NASAL specimens in patients 39 years of age and older), is one component of a comprehensive surveillance program. It is not intended to diagnose infection nor to guide or monitor treatment. Performed at [facility], [place] [address], [place], [place] [address]   C Difficile Quick Screen (NO PCR Reflex)     Status: Abnormal   Collection Time: 07/23/21  9:20 AM   Specimen: STOOL  Result Value Ref Range Status   C Diff antigen POSITIVE (A) NEGATIVE Final   C Diff toxin POSITIVE (A) NEGATIVE Final   C Diff interpretation Toxin producing C. difficile detected.  Final    Comment: CRITICAL RESULT CALLED TO, READ  BACK BY AND VERIFIED  WITH: Joeseph Amor RN Performed at [facility] [address], [place], [place] [address]   MRSA Next Gen by PCR, Nasal     Status: None   Collection Time: 07/24/21  3:27 PM   Specimen: Nasal Mucosa; Nasal Swab  Result Value Ref Range Status   MRSA by PCR Next Gen NOT DETECTED NOT DETECTED Final    Comment: (NOTE) The GeneXpert MRSA Assay (FDA approved for NASAL specimens only), is one component of a comprehensive MRSA colonization surveillance program. It is not intended to diagnose MRSA infection nor to guide or monitor treatment for MRSA infections. Test performance is not FDA approved in patients less than 39 years old. Performed at [facility], [place] [address] [place], [place] [address]           Radiology Studies: No results found.      Scheduled Meds:  Chlorhexidine Gluconate Cloth  6 each Topical Daily   mouth rinse  15 mL Mouth Rinse BID   pantoprazole  40 mg Intravenous Q12H   rosuvastatin  40 mg Oral QHS   vancomycin  125 mg Oral QID   Continuous Infusions:  sodium chloride     lactated ringers       LOS: 10 days        Hosie Poisson, MD [facility]   To contact the attending provider between 7A-7P or the covering provider during after hours 7P-7A, please log into the web site [email or web address] and access using universal [facility] password for that web site. If you do not have the password, please call the hospital operator.  07/28/2021, 1:44 PM

## 2021-07-28 NOTE — Progress Notes (Signed)
Physical Therapy Treatment Patient Details Name: [name] [name] MRN: [id number] DOB: Mar 28, 1951 Today's Date: 07/28/2021    History of Present Illness 70 year old male with history of diabetes mellitus type 2, chronic systolic CHF, CKD stage III, paroxysmal atrial fibrillation, CAD, hypertension, osteoarthritis, patient presented with abdominal pain and diarrhea.  On presentation was found to have acute cholecystitis with choledocholithiasis and mild pancreatitis along with leukocytosis elevated LFTs., + C-Diff.    PT Comments    Pt wanting to get ut of bed , however just respond with " I can't " however pt is very pleasant follows all commands and exhibits moderate strength for all he has been through. He may have a fear , but it improves as he continues to move. Feel he will continue to progress well and hopefully can ambulate more in room and hallway next session.  Encourage OOB to [facility] for toile ting  ( supervised at all times since he does report dizziness) and OOB to recliner for meals daily. Will continue to follow while  in acute care and also recommended OT to begin working with pt as well. Pt was from home alone Independent with ambulation and ADLSs prior to this current illness.    Follow Up Recommendations  SNF     Equipment Recommendations  None recommended by PT    Recommendations for Other Services       Precautions / Restrictions Precautions Precautions: Fall Precaution Comments: c diff, frequent stool    Mobility  Bed Mobility Overal bed mobility: Needs Assistance         Sit to supine: Min assist   General bed mobility comments: cues for how to prgress to edge of bed but pt able to assist a lot with his upper body and LEs. Did require more ques to have to patient think through to scoot froward more to get your feet on the floor. But responds easily to simple cues.    Transfers Overall transfer level: Needs assistance Equipment used: Rolling walker (2  wheeled) Transfers: Sit to/from Stand Sit to Stand: Mod assist         General transfer comment: to RW cues for hand placment and motor planning , but with cues pt was really a MinA at times and just intially was a [place]. Performed 2 times during session from bed and from [facility]  Ambulation/Gait Ambulation/Gait assistance: Min assist;+2 safety/equipment Gait Distance (Feet): 7 Feet Assistive device: Rolling walker (2 wheeled)       General Gait Details: was unable to walk in hallway due to timing. We got pt to [facility] with more diarrhea, and after he sat for a prolonged period of time he was fatigued and could only trasnfer to the recliner from there. He did have to stepp a lot to turn in circle to sit  using RW . Feel he will progress well with ambulation next session. May require a second person for safety measures only due to pt is doing all the work just takes him some time and due to pt's fear and not ambualted in since he has been here.   Stairs             Wheelchair Mobility    Modified Rankin (Stroke Patients Only)       Balance Overall balance assessment: Needs assistance Sitting-balance support: Feet supported Sitting balance-Leahy Scale: Fair     Standing balance support: Bilateral upper extremity supported;During functional activity Standing balance-Leahy Scale: Fair  Cognition Arousal/Alertness: Awake/alert Behavior During Therapy: Flat affect (speech slow and short answers, more methodical , but all appropriate answers) Overall Cognitive Status: No family/caregiver present to determine baseline cognitive functioning                                 General Comments: perserverates on that he "can't do something" wven when he is doing something. He doesn't get frustrated, but does state that he can't walk or stnad, but within a few seconds he is able to and with not a lot of assistance.      Exercises  Other Exercises Other Exercises: Began our session with B ankle pumps 10 times each and AAROM for heel slides supine B LE 5 times each to begin session. Limited due to pt needed to get to the bedside commode    General Comments        Pertinent Vitals/Pain Pain Assessment: No/denies pain    Home Living Family/patient expects to be discharged to:: Unsure Living Arrangements: Alone Available Help at Discharge: Family;Personal care attendant                Prior Function            PT Goals (current goals can now be found in the care plan section) Acute Rehab PT Goals Patient Stated Goal: I want to get stronger and get out of this bed. PT Goal Formulation: With patient Time For Goal Achievement: 08/07/21 Potential to Achieve Goals: Good Progress towards PT goals: Progressing toward goals    Frequency    Min 2X/week      PT Plan Current plan remains appropriate;Frequency needs to be updated    Co-evaluation              AM-PAC PT "6 Clicks" Mobility   Outcome Measure  Help needed turning from your back to your side while in a flat bed without using bedrails?: A Little Help needed moving from lying on your back to sitting on the side of a flat bed without using bedrails?: A Little Help needed moving to and from a bed to a chair (including a wheelchair)?: A Lot Help needed standing up from a chair using your arms (e.g., wheelchair or bedside chair)?: A Lot Help needed to walk in hospital room?: Total Help needed climbing 3-5 steps with a railing? : Total 6 Click Score: 12    End of Session Equipment Utilized During Treatment: Gait belt Activity Tolerance: Patient tolerated treatment well Patient left: in chair;with call bell/phone within reach (nurse stated pt was not going to be there very log due to having to go to MRI soon. Pt had tray in front of him and eating . Knows not to get up out of recliner.) Nurse Communication: Mobility status PT Visit  Diagnosis: Other abnormalities of gait and mobility (R26.89);Muscle weakness (generalized) (M62.81)     Time: [id number] PT Time Calculation (min) (ACUTE ONLY): 40 min  Charges:  $Therapeutic Activity: 38-52 mins                     Dorothy Polhemus, PT, MPT Acute Rehabilitation Services Office: [phone] Pager: [phone] 07/28/2021    Clide Dales 07/28/2021, 2:49 PM

## 2021-07-29 DIAGNOSIS — K8050 Calculus of bile duct without cholangitis or cholecystitis without obstruction: Secondary | ICD-10-CM | POA: Diagnosis not present

## 2021-07-29 DIAGNOSIS — N1832 Chronic kidney disease, stage 3b: Secondary | ICD-10-CM | POA: Diagnosis not present

## 2021-07-29 DIAGNOSIS — I5022 Chronic systolic (congestive) heart failure: Secondary | ICD-10-CM | POA: Diagnosis not present

## 2021-07-29 DIAGNOSIS — K810 Acute cholecystitis: Secondary | ICD-10-CM | POA: Diagnosis not present

## 2021-07-29 DIAGNOSIS — R0603 Acute respiratory distress: Secondary | ICD-10-CM | POA: Diagnosis not present

## 2021-07-29 DIAGNOSIS — Z7189 Other specified counseling: Secondary | ICD-10-CM

## 2021-07-29 DIAGNOSIS — Z515 Encounter for palliative care: Secondary | ICD-10-CM

## 2021-07-29 LAB — COMPREHENSIVE METABOLIC PANEL
ALT: 24 U/L (ref 0–44)
AST: 45 U/L — ABNORMAL HIGH (ref 15–41)
Albumin: 2.4 g/dL — ABNORMAL LOW (ref 3.5–5.0)
Alkaline Phosphatase: 302 U/L — ABNORMAL HIGH (ref 38–126)
Anion gap: 13 (ref 5–15)
BUN: 35 mg/dL — ABNORMAL HIGH (ref 8–23)
CO2: 26 mmol/L (ref 22–32)
Calcium: 8.4 mg/dL — ABNORMAL LOW (ref 8.9–10.3)
Chloride: 98 mmol/L (ref 98–111)
Creatinine, Ser: 1.77 mg/dL — ABNORMAL HIGH (ref 0.61–1.24)
GFR, Estimated: 41 mL/min — ABNORMAL LOW (ref >60)
Glucose, Bld: 116 mg/dL — ABNORMAL HIGH (ref 70–99)
Potassium: 4.3 mmol/L (ref 3.5–5.1)
Sodium: 137 mmol/L (ref 135–145)
Total Bilirubin: 2.1 mg/dL — ABNORMAL HIGH (ref 0.3–1.2)
Total Protein: 5.3 g/dL — ABNORMAL LOW (ref 6.5–8.1)

## 2021-07-29 LAB — CBC
HCT: 27 % — ABNORMAL LOW (ref 39.0–52.0)
Hemoglobin: 8.5 g/dL — ABNORMAL LOW (ref 13.0–17.0)
MCH: 26.3 pg (ref 26.0–34.0)
MCHC: 31.5 g/dL (ref 30.0–36.0)
MCV: 83.6 fL (ref 80.0–100.0)
Platelets: 149 10*3/uL — ABNORMAL LOW (ref 150–400)
RBC: 3.23 MIL/uL — ABNORMAL LOW (ref 4.22–5.81)
RDW: 23 % — ABNORMAL HIGH (ref 11.5–15.5)
WBC: 9.9 10*3/uL (ref 4.0–10.5)
nRBC: 0.6 % — ABNORMAL HIGH (ref 0.0–0.2)

## 2021-07-29 LAB — GLUCOSE, CAPILLARY
Glucose-Capillary: 125 mg/dL — ABNORMAL HIGH (ref 70–99)
Glucose-Capillary: 139 mg/dL — ABNORMAL HIGH (ref 70–99)
Glucose-Capillary: 166 mg/dL — ABNORMAL HIGH (ref 70–99)

## 2021-07-29 MED ORDER — PANTOPRAZOLE SODIUM 40 MG PO TBEC
40.0000 mg | DELAYED_RELEASE_TABLET | Freq: Two times a day (BID) | ORAL | Status: DC
  Administered 2021-07-29 – 2021-08-09 (×23): 40 mg via ORAL
  Filled 2021-07-29 (×23): qty 1

## 2021-07-29 NOTE — Evaluation (Signed)
Occupational Therapy Evaluation Patient Details Name: [name] [name] MRN: [id number] DOB: 27-Feb-1951 Today's Date: 07/29/2021    History of Present Illness 70 year old male with history of diabetes mellitus type 2, chronic systolic CHF, CKD stage III, paroxysmal atrial fibrillation, CAD, hypertension, osteoarthritis, patient presented with abdominal pain and diarrhea.  On presentation was found to have acute cholecystitis with choledocholithiasis and mild pancreatitis along with leukocytosis elevated LFTs., + C-Diff.   Clinical Impression   Patient lives home alone at baseline, has caregiver that assists with IADL such as grocery shopping and cleaning. Patient states independent with self care and still drives, uses walker "as needed." Currently patient limited by orthostatic with mobility, decreased balance and safety. Needing mod A to stand from elevated bed and multimodal cues to side step to head of bed. In standing BP reading 86/64 compared to 110/87 in sitting edge of bed prior to. Patient returned to bed as he was symptomatic reporting dizziness with min G back to supine and BP reading 97/62. RN notified. Recommend continued acute OT services to maximize safety and independence with ADLs in order to facilitate D/C to venue listed below.     Follow Up Recommendations  SNF    Equipment Recommendations  Tub/shower seat       Precautions / Restrictions Precautions Precautions: Fall Precaution Comments: c diff, frequent stool, orthostatic 9/9 Restrictions Weight Bearing Restrictions: No      Mobility Bed Mobility Overal bed mobility: Needs Assistance Bed Mobility: Supine to Sit;Sit to Supine     Supine to sit: Min guard;HOB elevated Sit to supine: Min guard;HOB elevated   General bed mobility comments: min G for safety, increased time to sit up trunk    Transfers Overall transfer level: Needs assistance Equipment used: Rolling walker (2 wheeled) Transfers: Sit to/from  Stand Sit to Stand: Mod assist;From elevated surface         General transfer comment: see toilet transfer in ADL section    Balance Overall balance assessment: Needs assistance Sitting-balance support: Feet supported Sitting balance-Leahy Scale: Fair     Standing balance support: Bilateral upper extremity supported;During functional activity Standing balance-Leahy Scale: Poor Standing balance comment: heavily reliant on UE support                           ADL either performed or assessed with clinical judgement   ADL Overall ADL's : Needs assistance/impaired     Grooming: Wash/dry face;Set up;Sitting   Upper Body Bathing: Set up;Supervision/ safety;Sitting   Lower Body Bathing: Moderate assistance;Sitting/lateral leans;Bed level   Upper Body Dressing : Set up;Sitting   Lower Body Dressing: Total assistance;Sitting/lateral leans Lower Body Dressing Details (indicate cue type and reason): have patient attempt LB dressing in sitting, unable reprots dizziness Toilet Transfer: Moderate assistance;Cueing for safety;RW Toilet Transfer Details (indicate cue type and reason): mod A to power up to standing with significant difficulty extending at hips and transitioning hands up to walker. unsteady/tremulous. able to take few side steps to head of bed with multimodal cues. Toileting- Clothing Manipulation and Hygiene: Total assistance;Sit to/from stand;Sitting/lateral lean       Functional mobility during ADLs: Moderate assistance;Rolling walker;Cueing for sequencing;Cueing for safety General ADL Comments: patient orthostatic limiting session and did not transfer to chair. RN made aware      Pertinent Vitals/Pain Pain Assessment: No/denies pain     Hand Dominance Right   Extremity/Trunk Assessment Upper Extremity Assessment Upper Extremity Assessment: Generalized weakness  Lower Extremity Assessment Lower Extremity Assessment: Defer to PT evaluation        Communication Communication Communication: No difficulties   Cognition Arousal/Alertness: Awake/alert Behavior During Therapy: Flat affect Overall Cognitive Status: No family/caregiver present to determine baseline cognitive functioning Area of Impairment: Problem solving                             Problem Solving: Slow processing;Decreased initiation;Requires verbal cues;Requires tactile cues General Comments: patient asking "what are we doing, why am I doing this" after multiple explanations of role therapy/OT in hospital. Also asking "why do I feel this way, am I dying?"   General Comments  BP sitting 116/75, after ~5 mins sitting 110/87, BP standing 86/64, BP return supine 97/62            Home Living Family/patient expects to be discharged to:: Private residence Living Arrangements: Alone Available Help at Discharge: Family;Personal care attendant Type of Home: House Home Access: Stairs to enter [employer] of Steps: 4 Entrance Stairs-Rails: Right;Left Home Layout: Two level;Able to live on main level with bedroom/bathroom Alternate Level Stairs-Number of Steps: 14   Bathroom Shower/Tub: Occupational psychologist: Handicapped height     Home Equipment: Environmental consultant - 2 wheels;Bedside commode;Cane - single point;Grab bars - tub/shower          Prior Functioning/Environment Level of Independence: Needs assistance  Gait / Transfers Assistance Needed: ambulates with walker "as needed" ADL's / Homemaking Assistance Needed: patient reports independent with basic self care, has "a lady" that gets his groceries, cleans. patient states he still drives            OT Problem List: Decreased activity tolerance;Impaired balance (sitting and/or standing);Decreased cognition;Decreased safety awareness      OT Treatment/Interventions: Self-care/ADL training;Therapeutic activities;Cognitive remediation/compensation;Patient/family education;Balance  training    OT Goals(Current goals can be found in the care plan section) Acute Rehab OT Goals Patient Stated Goal: I want to get stronger and get out of this bed. OT Goal Formulation: With patient Time For Goal Achievement: 08/12/21 Potential to Achieve Goals: Good  OT Frequency: Min 2X/week    AM-PAC OT "6 Clicks" Daily Activity     Outcome Measure Help from another person eating meals?: None Help from another person taking care of personal grooming?: A Little Help from another person toileting, which includes using toliet, bedpan, or urinal?: A Lot Help from another person bathing (including washing, rinsing, drying)?: A Lot Help from another person to put on and taking off regular upper body clothing?: A Little Help from another person to put on and taking off regular lower body clothing?: A Lot 6 Click Score: 16   End of Session Equipment Utilized During Treatment: Gait belt;Rolling walker Nurse Communication: Mobility status;Other (comment) (orthostatic in standing)  Activity Tolerance: Treatment limited secondary to medical complications (Comment) (orthostatic) Patient left: in bed;with call bell/phone within reach;with bed alarm set  OT Visit Diagnosis: Unsteadiness on feet (R26.81);Other abnormalities of gait and mobility (R26.89)                Time: [id number] OT Time Calculation (min): 39 min Charges:  OT General Charges $OT Visit: 1 Visit OT Evaluation $OT Eval Moderate Complexity: 1 Mod OT Treatments $Self Care/Home Management : 23-37 mins  Delbert Phenix OT OT pager: [place] 07/29/2021, 12:31 PM

## 2021-07-29 NOTE — Progress Notes (Signed)
PROGRESS NOTE    [name] [name]  [id number] DOB: [birth year] DOA: 07/17/2021 PCP: Raeanne Gathers, MD    Chief Complaint  Patient presents with   Abdominal Pain    Brief Narrative:  70 year old gentleman prior history of hypertension, diabetes, chronic systolic heart failure, stage IIIa CKD, paroxysmal atrial fibrillation, coronary artery disease presents with abdominal pain and diarrhea patient was found to have acute cholecystitis with choledocholithiasis and mild pancreatitis.  He was started on broad-spectrum antibiotics and general surgery, GI consulted.  In view of increased surgical risk patient did not undergo a cholecystectomy.  He was treated conservatively with antibiotics.  Hospital course complicated by diarrhea which was found to be C. difficile diarrhea, GI bleed probably secondary to Eliquis.  Patient received Kcentra for Eliquis reversal, GI consulted underwent EGD found to have a bleeding vessel at the sphincterotomy site which was endoclipped on 07/24/2021.  He was transferred to ICU for hemorrhagic shock briefly requiring Levophed.  He is currently off all pressors.  Assessment & Plan:   Principal Problem:   Acute cholecystitis Active Problems:   Essential hypertension   Paroxysmal atrial fibrillation (HCC)   Choledocholithiasis   Pancreatitis   Prolonged QT interval   Diabetes mellitus type 2, uncomplicated (HCC)   CKD (chronic kidney disease) stage 3, GFR 30-59 ml/min (HCC)   Thrombocytopenia (HCC)   Leukocytosis   Chronic systolic CHF (congestive heart failure) (HCC)   Hyperbilirubinemia   Acute cholecystitis/choledocholithiasis/pain biliary pancreatitis S/p ERCP showing choledocholithiasis underwent stone removal with sphincterotomy.  Cholecystectomy was scheduled however it was canceled as cardiology could not clear him for surgery.  General surgery has signed off and recommended outpatient follow-up in the office. Patient has received 3 days of IV  Zosyn which was discontinued followed by oral Augmentin for 2 more days. Pt reports occasional intermittent abd pain, resolves spontaneously. Repeat labs show elevated lipase, elevated alk phos and mild elevation in bilirubin. Repeat CT abdomen and pelvis shows  Extensive pneumobilia is noted in the left hepatic ducts as well as in the common bile and cystic ducts. Large amount of air is also noted in the gallbladder lumen. This most likely is due to recent instrumentation. Moderate gallbladder wall thickening is noted without cholelithiasis concerning for cholecystitis.    Mild acute on chronic combined systolic and diastolic heart failure Echocardiogram showed left ventricular ejection fraction of 20 to 25% with severe right ventricular dysfunction Cardiology on board and has ordered a PYP scan as outpatient to evaluate for amyloid, SPEP, UPEP and light chains have been ordered. He was initially started on IV Lasix for fluid overload but has been discontinued due to worsening renal parameters Pt remains off lasix for now.  Outpatient follow up with cardiology.     Mild thrombocytopenia. Much improved.    Lower GI bleed in the setting of C. difficile colitis Patient was on Eliquis for anticoagulation for atrial fibrillation which was reversed with Kcentra.  GI consulted patient underwent EGD was found to have a bleeding all vessel at the sphincterotomy site underwent Endo clipping of the bleeding vessel.  Patient was transferred to ICU for hemorrhagic shock and was on Levophed briefly.  Patient currently off all pressors and hemoglobin stable between 8 and 9. No bleeding seen.    C. difficile diarrhea Improving , only 2 bowel movements and currently on oral vancomycin.    Type 2 diabetes mellitus Continue with sliding scale insulin at this time. CBG (last 3)  Recent Labs  07/28/21 2331 07/29/21 0514 07/29/21 1212  GLUCAP 123* 125* 139*    No changes in meds    Mild  acute on stage IIIb CKD Improving renal parameters with hydration. Continue to monitor. No changes.      Paroxysmal atrial fibrillation Rate controlled and on Eliquis which has been on hold for now. Will restart the eliquis in tomorrow if hemoglobin remains stable.     Coronary artery disease s/p NSTEMI in 2008 s/p stent to RCA and LAD Continue with statin at this time   Mild acute encephalopathy: Probably sec to acute illness. Pt appears to be at baseline.  MRI brain is negative for acute stroke.     In view of multiple medical problems, deconditioning and debility will request palliative care for goals of care discussion.    DVT prophylaxis: SCDs Code Status: Full code Family Communication: (None at bedside) Disposition:   Status is: Inpatient  Remains inpatient appropriate because:Ongoing diagnostic testing needed not appropriate for outpatient work up, Unsafe d/c plan, and IV treatments appropriate due to intensity of illness or inability to take PO  Dispo: The patient is from: Home              Anticipated d/c is to: SNF              Patient currently is medically stable to d/c.   Difficult to place patient No       Consultants:  Critical care PCCM  Cardiology Gastroenterology Procedures: .none.  Antimicrobials:  Antibiotics Given (last 72 hours)     Date/Time Action Medication Dose Rate   07/26/21 1825 Given   vancomycin (VANCOCIN) capsule 125 mg 125 mg    07/26/21 2300 Given   vancomycin (VANCOCIN) capsule 125 mg 125 mg    07/27/21 0012 New Bag/Given   metroNIDAZOLE (FLAGYL) IVPB 500 mg 500 mg 100 mL/hr   07/27/21 0759 New Bag/Given   metroNIDAZOLE (FLAGYL) IVPB 500 mg 500 mg 100 mL/hr   07/27/21 1004 Given   vancomycin (VANCOCIN) capsule 125 mg 125 mg    07/27/21 1336 Given   vancomycin (VANCOCIN) capsule 125 mg 125 mg    07/27/21 1738 Given   vancomycin (VANCOCIN) capsule 125 mg 125 mg    07/27/21 2119 Given   vancomycin (VANCOCIN)  capsule 125 mg 125 mg    07/28/21 1100 Given   vancomycin (VANCOCIN) capsule 125 mg 125 mg    07/28/21 1423 Given   vancomycin (VANCOCIN) capsule 125 mg 125 mg    07/28/21 1834 Given   vancomycin (VANCOCIN) capsule 125 mg 125 mg    07/28/21 2258 Given   vancomycin (VANCOCIN) capsule 125 mg 125 mg    07/29/21 1008 Given   vancomycin (VANCOCIN) capsule 125 mg 125 mg    07/29/21 1525 Given   vancomycin (VANCOCIN) capsule 125 mg 125 mg          Subjective: No chest pain or shortness of breath  Objective: Vitals:   07/28/21 1500 07/28/21 2114 07/29/21 0512 07/29/21 1319  BP: 108/68 (!) 95/54 106/65 102/67  Pulse: 68 62 69 72  Resp: 16 14 18 15   Temp: 98 F (36.7 C) 97.8 F (36.6 C) 97.7 F (36.5 C) (!) 97.5 F (36.4 C)  TempSrc: Oral Oral Oral Oral  SpO2: 98% 100% 99% 96%  Weight:   93 kg   Height:        Intake/Output Summary (Last 24 hours) at 07/29/2021 1540 Last data filed at 07/29/2021 0518 Gross per  24 hour  Intake --  Output 500 ml  Net -500 ml    Filed Weights   07/27/21 0500 07/28/21 0544 07/29/21 0512  Weight: 96.9 kg 97 kg 93 kg    Examination:  General exam: Elderly gentleman, ill-appearing, no distress noted.  Respiratory system: air entry fair, no wheezing heard.  Cardiovascular system: S1-S2 heard, RRR, no JVD  no pedal edema. Gastrointestinal system: Abdomen is soft, NT ND BS+ Central nervous system: Alert  , able to answer all questions appropriately Extremities: No pedal edema Skin: No rashes seen psychiatry: Mood is appropriate    Data Reviewed: I have personally reviewed following labs and imaging studies  CBC: Recent Labs  Lab 07/23/21 2102 07/24/21 0518 07/25/21 1351 07/25/21 2219 07/27/21 0730 07/28/21 0516 07/28/21 1223 07/29/21 0538  WBC 14.1*   < > 16.6* 14.4* 12.3* 8.9  --  9.9  NEUTROABS 12.2*  --   --   --   --   --   --   --   HGB 13.3   < > 9.4* 9.1* 9.6* 8.8* 9.1* 8.5*  HCT 42.0   < > 29.7* 28.4* 33.1* 28.6* 29.8*  27.0*  MCV 80.3   < > 83.2 82.3 87.8 84.6  --  83.6  PLT 112*   < > 125* 128* 111* 144*  --  149*   < > = values in this interval not displayed.     Basic Metabolic Panel: Recent Labs  Lab 07/23/21 0555 07/24/21 0518 07/25/21 0246 07/25/21 2219 07/27/21 0936 07/28/21 0516 07/29/21 0538  NA 140   < > 141 139 137 142 137  K 4.2   < > 4.5 4.1 4.3 4.3 4.3  CL 104   < > 103 103 100 106 98  CO2 26   < > 32 28 30 32 26  GLUCOSE 149*   < > 200* 165* 139* 135* 116*  BUN 78*   < > 79* 69* 45* 37* 35*  CREATININE 2.05*   < > 2.11* 1.92* 1.60* 1.69* 1.77*  CALCIUM 9.2   < > 9.1 8.6* 8.5* 8.7* 8.4*  MG 2.1  --   --  2.2  --   --   --    < > = values in this interval not displayed.     GFR: Estimated Creatinine Clearance: 45.1 mL/min (A) (by C-G formula based on SCr of 1.77 mg/dL (H)).  Liver Function Tests: Recent Labs  Lab 07/28/21 1223 07/29/21 0538  AST 39 45*  ALT 25 24  ALKPHOS 316* 302*  BILITOT 2.0* 2.1*  PROT 5.6* 5.3*  ALBUMIN 2.4* 2.4*     CBG: Recent Labs  Lab 07/28/21 0541 07/28/21 1304 07/28/21 2331 07/29/21 0514 07/29/21 1212  GLUCAP 108* 108* 123* 125* 139*      Recent Results (from the past 240 hour(s))  Surgical pcr screen     Status: None   Collection Time: 07/20/21  9:05 AM   Specimen: Nasal Mucosa; Nasal Swab  Result Value Ref Range Status   MRSA, PCR NEGATIVE NEGATIVE Final   Staphylococcus aureus NEGATIVE NEGATIVE Final    Comment: (NOTE) The Xpert SA Assay (FDA approved for NASAL specimens in patients 9 years of age and older), is one component of a comprehensive surveillance program. It is not intended to diagnose infection nor to guide or monitor treatment. Performed at [facility], [place] [address] [place], [place] [address]   C Difficile Quick Screen (NO PCR Reflex)  Status: Abnormal   Collection Time: 07/23/21  9:20 AM   Specimen: STOOL  Result Value Ref Range Status   C Diff antigen POSITIVE (A)  NEGATIVE Final   C Diff toxin POSITIVE (A) NEGATIVE Final   C Diff interpretation Toxin producing C. difficile detected.  Final    Comment: CRITICAL RESULT CALLED TO, READ BACK BY AND VERIFIED WITH: Joeseph Amor RN Performed at [facility] [address] [place], [place] [address]   MRSA Next Gen by PCR, Nasal     Status: None   Collection Time: 07/24/21  3:27 PM   Specimen: Nasal Mucosa; Nasal Swab  Result Value Ref Range Status   MRSA by PCR Next Gen NOT DETECTED NOT DETECTED Final    Comment: (NOTE) The GeneXpert MRSA Assay (FDA approved for NASAL specimens only), is one component of a comprehensive MRSA colonization surveillance program. It is not intended to diagnose MRSA infection nor to guide or monitor treatment for MRSA infections. Test performance is not FDA approved in patients less than 51 years old. Performed at [facility], [place] [address] [place],  [address]           Radiology Studies: CT ABDOMEN PELVIS WO CONTRAST  Result Date: 07/28/2021 CLINICAL DATA:  Acute generalized abdominal pain.  Elevated lipase. EXAM: CT ABDOMEN AND PELVIS WITHOUT CONTRAST TECHNIQUE: Multidetector CT imaging of the abdomen and pelvis was performed following the standard protocol without IV contrast. COMPARISON:  July 17, 2021. FINDINGS: Lower chest: Small bilateral pleural effusions are noted with adjacent subsegmental atelectasis. Hepatobiliary: No focal abnormality is noted in the liver on these unenhanced images. Left hepatic pneumobilia is noted as well as air noted in common bile duct and cystic duct. Large amount of air is also noted in the gallbladder lumen. Moderate gallbladder wall thickening is noted without cholelithiasis, concerning for cholecystitis. There appears to be a biliary stent in the second portion of the duodenum. Pancreas: Unremarkable. No pancreatic ductal dilatation or surrounding inflammatory changes. Spleen: Normal  in size without focal abnormality. Adrenals/Urinary Tract: Adrenal glands appear normal. Nonobstructive right renal calculus is noted. No hydronephrosis or renal obstruction is noted. Urinary bladder is unremarkable. Stomach/Bowel: Stomach is within normal limits. Appendix appears normal. No evidence of bowel wall thickening, distention, or inflammatory changes. Vascular/Lymphatic: Aortic atherosclerosis. No enlarged abdominal or pelvic lymph nodes. Reproductive: Prostate is unremarkable. Other: Minimal ascites is noted.  No definite hernia is noted. Musculoskeletal: No acute or significant osseous findings. IMPRESSION: Small bilateral pleural effusions are noted with adjacent subsegmental atelectasis. Extensive pneumobilia is noted in the left hepatic ducts as well as in the common bile and cystic ducts. Large amount of air is also noted in the gallbladder lumen. This most likely is due to recent instrumentation. Moderate gallbladder wall thickening is noted without cholelithiasis concerning for cholecystitis. Nonobstructive right renal calculus. Minimal ascites. Aortic Atherosclerosis (ICD10-I70.0). Electronically Signed   By: Marijo Conception M.D.   On: 07/28/2021 19:57   MR BRAIN WO CONTRAST  Result Date: 07/29/2021 CLINICAL DATA:  Initial evaluation for mental status change. EXAM: MRI HEAD WITHOUT CONTRAST TECHNIQUE: Multiplanar, multiecho pulse sequences of the brain and surrounding structures were obtained without intravenous contrast. COMPARISON:  Head CT from 07/22/2021. FINDINGS: Brain: Diffuse prominence of the CSF containing spaces compatible with generalized cerebral atrophy. No significant cerebral white matter disease for age. No abnormal foci of restricted diffusion to suggest acute or subacute ischemia. Gray-white matter differentiation maintained. No encephalomalacia to suggest  chronic cortical infarction. No acute intracranial hemorrhage. Single punctate chronic microhemorrhage noted within the  right frontal centrum semi ovale, of doubtful significance in isolation. No mass lesion, midline shift or mass effect. Mild ventricular prominence related to global parenchymal volume loss without hydrocephalus. No extra-axial fluid collection. Pituitary gland suprasellar region normal. Midline structures intact and normal. Vascular: Right vertebral artery hypoplastic and not well seen. Major intracranial vascular flow voids otherwise maintained. Skull and upper cervical spine: Craniocervical junction within normal limits. Bone marrow signal intensity within normal limits. No scalp soft tissue abnormality. Sinuses/Orbits: Globes and orbital soft tissues demonstrate no acute finding. Paranasal sinuses are largely clear. No significant mastoid effusion. Inner ear structures grossly normal. Other: None. IMPRESSION: 1. No acute intracranial abnormality. 2. Mildly advanced cerebral atrophy for age. Otherwise unremarkable brain MRI. Electronically Signed   By: Jeannine Boga M.D.   On: 07/29/2021 00:30        Scheduled Meds:  Chlorhexidine Gluconate Cloth  6 each Topical Daily   mouth rinse  15 mL Mouth Rinse BID   pantoprazole  40 mg Oral BID   rosuvastatin  40 mg Oral QHS   vancomycin  125 mg Oral QID   Continuous Infusions:  sodium chloride     lactated ringers       LOS: 11 days        Hosie Poisson, MD [facility]   To contact the attending provider between 7A-7P or the covering provider during after hours 7P-7A, please log into the web site [email or web address] and access using universal [place] password for that web site. If you do not have the password, please call the hospital operator.  07/29/2021, 3:40 PM

## 2021-07-29 NOTE — TOC Progression Note (Signed)
Transition of Care [facility]) - Progression Note    Patient Details  Name: CODYLEE BAJAJ MRN: [id number] Date of Birth: [birth year]  [facility]) CM/SW Contact  Jaren Vanetten, Marjie Skiff, RN Phone Number: 07/29/2021, 3:42 PM  Clinical Narrative:    Spoke with son Will via phone to offer choice for SNF. [facility] was first choice and [facility] second choice. [facility] liaison states that they can take pt on Monday. [facility] liaison did not return my call. Insurance auth started with Bernadene Bell [id number] for [facility] on Monday. TOC will follow.   Expected Discharge Plan: Skilled Nursing Facility Barriers to Discharge: Continued Medical Work up  Expected Discharge Plan and Services Expected Discharge Plan: [place] arrangements for the past 2 months: Single Family Home                   Readmission Risk Interventions Readmission Risk Prevention Plan 07/27/2021  Transportation Screening Complete  PCP or Specialist Appt within 3-5 Days Complete  HRI or Home Care Consult Complete  Social Work Consult for [place] Planning/Counseling Complete  Palliative Care Screening Complete  Medication Review [employer]) Complete  Some recent data might be hidden

## 2021-07-30 DIAGNOSIS — K810 Acute cholecystitis: Secondary | ICD-10-CM | POA: Diagnosis not present

## 2021-07-30 DIAGNOSIS — K8050 Calculus of bile duct without cholangitis or cholecystitis without obstruction: Secondary | ICD-10-CM | POA: Diagnosis not present

## 2021-07-30 DIAGNOSIS — R0603 Acute respiratory distress: Secondary | ICD-10-CM | POA: Diagnosis not present

## 2021-07-30 LAB — COMPREHENSIVE METABOLIC PANEL
ALT: 23 U/L (ref 0–44)
AST: 53 U/L — ABNORMAL HIGH (ref 15–41)
Albumin: 2.5 g/dL — ABNORMAL LOW (ref 3.5–5.0)
Alkaline Phosphatase: 285 U/L — ABNORMAL HIGH (ref 38–126)
Anion gap: 7 (ref 5–15)
BUN: 33 mg/dL — ABNORMAL HIGH (ref 8–23)
CO2: 27 mmol/L (ref 22–32)
Calcium: 8.6 mg/dL — ABNORMAL LOW (ref 8.9–10.3)
Chloride: 102 mmol/L (ref 98–111)
Creatinine, Ser: 1.93 mg/dL — ABNORMAL HIGH (ref 0.61–1.24)
GFR, Estimated: 37 mL/min — ABNORMAL LOW (ref >60)
Glucose, Bld: 152 mg/dL — ABNORMAL HIGH (ref 70–99)
Potassium: 4.4 mmol/L (ref 3.5–5.1)
Sodium: 136 mmol/L (ref 135–145)
Total Bilirubin: 1.7 mg/dL — ABNORMAL HIGH (ref 0.3–1.2)
Total Protein: 5.7 g/dL — ABNORMAL LOW (ref 6.5–8.1)

## 2021-07-30 LAB — GLUCOSE, CAPILLARY
Glucose-Capillary: 157 mg/dL — ABNORMAL HIGH (ref 70–99)
Glucose-Capillary: 168 mg/dL — ABNORMAL HIGH (ref 70–99)
Glucose-Capillary: 171 mg/dL — ABNORMAL HIGH (ref 70–99)
Glucose-Capillary: 248 mg/dL — ABNORMAL HIGH (ref 70–99)

## 2021-07-30 MED ORDER — INSULIN ASPART 100 UNIT/ML IJ SOLN
0.0000 [IU] | Freq: Three times a day (TID) | INTRAMUSCULAR | Status: DC
  Administered 2021-07-30 (×2): 2 [IU] via SUBCUTANEOUS
  Administered 2021-07-31: 1 [IU] via SUBCUTANEOUS
  Administered 2021-07-31: 2 [IU] via SUBCUTANEOUS
  Administered 2021-07-31: 5 [IU] via SUBCUTANEOUS
  Administered 2021-08-01 (×2): 2 [IU] via SUBCUTANEOUS
  Administered 2021-08-01: 3 [IU] via SUBCUTANEOUS
  Administered 2021-08-02 (×2): 1 [IU] via SUBCUTANEOUS
  Administered 2021-08-02: 2 [IU] via SUBCUTANEOUS
  Administered 2021-08-03: 1 [IU] via SUBCUTANEOUS
  Administered 2021-08-03 (×2): 2 [IU] via SUBCUTANEOUS
  Administered 2021-08-04: 3 [IU] via SUBCUTANEOUS
  Administered 2021-08-04 – 2021-08-05 (×4): 1 [IU] via SUBCUTANEOUS
  Administered 2021-08-05 – 2021-08-06 (×3): 2 [IU] via SUBCUTANEOUS
  Administered 2021-08-06 – 2021-08-07 (×2): 1 [IU] via SUBCUTANEOUS
  Administered 2021-08-07 – 2021-08-08 (×3): 2 [IU] via SUBCUTANEOUS
  Administered 2021-08-08 – 2021-08-09 (×3): 3 [IU] via SUBCUTANEOUS
  Administered 2021-08-09: 1 [IU] via SUBCUTANEOUS
  Administered 2021-08-09: 2 [IU] via SUBCUTANEOUS

## 2021-07-30 NOTE — Progress Notes (Signed)
PROGRESS NOTE    [name] [name]  EYC:[id number] DOB: [birth year] DOA: 07/17/2021 PCP: Raeanne Gathers, MD    Chief Complaint  Patient presents with   Abdominal Pain    Brief Narrative:  70 year old gentleman prior history of hypertension, diabetes, chronic systolic heart failure, stage IIIa CKD, paroxysmal atrial fibrillation, coronary artery disease presents with abdominal pain and diarrhea patient was found to have acute cholecystitis with choledocholithiasis and mild pancreatitis.  He was started on broad-spectrum antibiotics and general surgery, GI consulted.  In view of increased surgical risk patient did not undergo a cholecystectomy.  He was treated conservatively with antibiotics.  Hospital course complicated by diarrhea which was found to be C. difficile diarrhea, GI bleed probably secondary to Eliquis.  Patient received Kcentra for Eliquis reversal, GI consulted underwent EGD found to have a bleeding vessel at the sphincterotomy site which was endoclipped on 07/24/2021.  He was transferred to ICU for hemorrhagic shock briefly requiring Levophed.  He is currently off all pressors. Pt seen and examined this am. Reports feeling okay. No new complaints. Only 1 bm yesterday.   Assessment & Plan:   Principal Problem:   Acute cholecystitis Active Problems:   Essential hypertension   Paroxysmal atrial fibrillation (HCC)   Choledocholithiasis   Pancreatitis   Prolonged QT interval   Diabetes mellitus type 2, uncomplicated (HCC)   CKD (chronic kidney disease) stage 3, GFR 30-59 ml/min (HCC)   Thrombocytopenia (HCC)   Leukocytosis   Chronic systolic CHF (congestive heart failure) (HCC)   Hyperbilirubinemia   Acute cholecystitis/choledocholithiasis/pain biliary pancreatitis S/p ERCP showing choledocholithiasis underwent stone removal with sphincterotomy.  Cholecystectomy was scheduled however it was canceled as cardiology could not clear him for surgery.  General surgery has signed  off and recommended outpatient follow-up in the office. Patient has received 3 days of IV Zosyn which was discontinued followed by oral Augmentin for 2 more days. Pt reports occasional intermittent abd pain, resolves spontaneously. Repeat labs show elevated lipase, elevated alk phos and mild elevation in bilirubin. Repeat CT abdomen and pelvis shows  Extensive pneumobilia is noted in the left hepatic ducts as well as in the common bile and cystic ducts. Large amount of air is also noted in the gallbladder lumen. This most likely is due to recent instrumentation. Moderate gallbladder wall thickening is noted without cholelithiasis concerning for cholecystitis. Pt denies any abdominal pain today. Slightly nauseated , no vomiting.     Mild acute on chronic combined systolic and diastolic heart failure Echocardiogram showed left ventricular ejection fraction of 20 to 25% with severe right ventricular dysfunction Cardiology on board and has ordered a PYP scan as outpatient to evaluate for amyloid, SPEP, UPEP and light chains have been ordered. Recommend outpatient follow up.  He was initially started on IV Lasix for fluid overload but has been discontinued due to worsening renal parameters Pt remains off lasix for now.  Outpatient follow up with cardiology.     Mild thrombocytopenia. Much improved.    Lower GI bleed in the setting of C. difficile colitis Patient was on Eliquis for anticoagulation for atrial fibrillation which was reversed with Kcentra.  GI consulted patient underwent EGD was found to have a bleeding all vessel at the sphincterotomy site underwent Endo clipping of the bleeding vessel.  Patient was transferred to ICU for hemorrhagic shock and was on Levophed briefly.  Patient currently off all pressors and hemoglobin stable between 8 and 9. No bleeding seen. Will wait till tomorrow, recheck hemoglobin and  start eliquis.     C. difficile diarrhea Improving , only 1 bowel movements  and currently on oral vancomycin.    Type 2 diabetes mellitus Continue with sliding scale insulin at this time. CBG (last 3)  Recent Labs    07/29/21 1818 07/30/21 0000 07/30/21 0640  GLUCAP 166* 248* 171*    No changes in meds    Mild acute on stage IIIb CKD Creatinine slighlty up to 1.9. not on any diuretics or nephrotoxins.  Continue to monitor. No changes.      Paroxysmal atrial fibrillation Rate controlled and on Eliquis which has been on hold for now. Will restart the eliquis in tomorrow if hemoglobin remains stable.     Coronary artery disease s/p NSTEMI in 2008 s/p stent to RCA and LAD Continue with statin at this time   Mild acute encephalopathy: Probably sec to acute illness. Pt appears to be at baseline.  MRI brain is negative for acute stroke.     In view of multiple medical problems, deconditioning and debility will request palliative care for goals of care discussion.    DVT prophylaxis: SCDs Code Status: Full code Family Communication: (None at bedside) Disposition:   Status is: Inpatient  Remains inpatient appropriate because:Ongoing diagnostic testing needed not appropriate for outpatient work up, Unsafe d/c plan, and IV treatments appropriate due to intensity of illness or inability to take PO  Dispo: The patient is from: Home              Anticipated d/c is to: SNF              Patient currently is medically stable to d/c.   Difficult to place patient No       Consultants:  Critical care PCCM  Cardiology Gastroenterology Procedures: .none.  Antimicrobials:  Antibiotics Given (last 72 hours)     Date/Time Action Medication Dose   07/27/21 1004 Given   vancomycin (VANCOCIN) capsule 125 mg 125 mg   07/27/21 1336 Given   vancomycin (VANCOCIN) capsule 125 mg 125 mg   07/27/21 1738 Given   vancomycin (VANCOCIN) capsule 125 mg 125 mg   07/27/21 2119 Given   vancomycin (VANCOCIN) capsule 125 mg 125 mg   07/28/21 1100 Given    vancomycin (VANCOCIN) capsule 125 mg 125 mg   07/28/21 1423 Given   vancomycin (VANCOCIN) capsule 125 mg 125 mg   07/28/21 1834 Given   vancomycin (VANCOCIN) capsule 125 mg 125 mg   07/28/21 2258 Given   vancomycin (VANCOCIN) capsule 125 mg 125 mg   07/29/21 1008 Given   vancomycin (VANCOCIN) capsule 125 mg 125 mg   07/29/21 1525 Given   vancomycin (VANCOCIN) capsule 125 mg 125 mg   07/29/21 1813 Given   vancomycin (VANCOCIN) capsule 125 mg 125 mg   07/30/21 0003 Given   vancomycin (VANCOCIN) capsule 125 mg 125 mg         Subjective: No new complaints.   Objective: Vitals:   07/29/21 0512 07/29/21 1319 07/29/21 2300 07/30/21 0700  BP: 106/65 102/67 102/62 103/64  Pulse: 69 72 80 67  Resp: 18 15    Temp: 97.7 F (36.5 C) (!) 97.5 F (36.4 C) 97.7 F (36.5 C) 97.8 F (36.6 C)  TempSrc: Oral Oral Oral Oral  SpO2: 99% 96% 99% 100%  Weight: 93 kg     Height:        Intake/Output Summary (Last 24 hours) at 07/30/2021 0904 Last data filed at 07/29/2021 1900 Gross per  24 hour  Intake --  Output 1 ml  Net -1 ml    Filed Weights   07/27/21 0500 07/28/21 0544 07/29/21 0512  Weight: 96.9 kg 97 kg 93 kg    Examination:  Physical Exam General: elderly gentleman Alert and oriented x 3, NAD Cardiovascular: S1 S2 clear, RRR. No pedal edema b/l Respiratory: CTAB, no wheezing, rales or rhonchi Gastrointestinal: Soft, mildly tender, non distended, bowel sounds wnl.  Ext: no pedal edema bilaterally Neuro: no new deficits Skin: No rashes Psych: mood is appropriate.      Data Reviewed: I have personally reviewed following labs and imaging studies  CBC: Recent Labs  Lab 07/23/21 2102 07/24/21 0518 07/25/21 1351 07/25/21 2219 07/27/21 0730 07/28/21 0516 07/28/21 1223 07/29/21 0538  WBC 14.1*   < > 16.6* 14.4* 12.3* 8.9  --  9.9  NEUTROABS 12.2*  --   --   --   --   --   --   --   HGB 13.3   < > 9.4* 9.1* 9.6* 8.8* 9.1* 8.5*  HCT 42.0   < > 29.7* 28.4* 33.1*  28.6* 29.8* 27.0*  MCV 80.3   < > 83.2 82.3 87.8 84.6  --  83.6  PLT 112*   < > 125* 128* 111* 144*  --  149*   < > = values in this interval not displayed.     Basic Metabolic Panel: Recent Labs  Lab 07/25/21 0246 07/25/21 2219 07/27/21 0936 07/28/21 0516 07/29/21 0538  NA 141 139 137 142 137  K 4.5 4.1 4.3 4.3 4.3  CL 103 103 100 106 98  CO2 32 28 30 32 26  GLUCOSE 200* 165* 139* 135* 116*  BUN 79* 69* 45* 37* 35*  CREATININE 2.11* 1.92* 1.60* 1.69* 1.77*  CALCIUM 9.1 8.6* 8.5* 8.7* 8.4*  MG  --  2.2  --   --   --      GFR: Estimated Creatinine Clearance: 45.1 mL/min (A) (by C-G formula based on SCr of 1.77 mg/dL (H)).  Liver Function Tests: Recent Labs  Lab 07/28/21 1223 07/29/21 0538  AST 39 45*  ALT 25 24  ALKPHOS 316* 302*  BILITOT 2.0* 2.1*  PROT 5.6* 5.3*  ALBUMIN 2.4* 2.4*     CBG: Recent Labs  Lab 07/29/21 0514 07/29/21 1212 07/29/21 1818 07/30/21 0000 07/30/21 0640  GLUCAP 125* 139* 166* 248* 171*      Recent Results (from the past 240 hour(s))  Surgical pcr screen     Status: None   Collection Time: 07/20/21  9:05 AM   Specimen: Nasal Mucosa; Nasal Swab  Result Value Ref Range Status   MRSA, PCR NEGATIVE NEGATIVE Final   Staphylococcus aureus NEGATIVE NEGATIVE Final    Comment: (NOTE) The Xpert SA Assay (FDA approved for NASAL specimens in patients 22 years of age and older), is one component of a comprehensive surveillance program. It is not intended to diagnose infection nor to guide or monitor treatment. Performed at [facility], [place] [address] [place], [place] [address]   C Difficile Quick Screen (NO PCR Reflex)     Status: Abnormal   Collection Time: 07/23/21  9:20 AM   Specimen: STOOL  Result Value Ref Range Status   C Diff antigen POSITIVE (A) NEGATIVE Final   C Diff toxin POSITIVE (A) NEGATIVE Final   C Diff interpretation Toxin producing C. difficile detected.  Final    Comment: CRITICAL RESULT  CALLED TO, READ BACK BY  AND VERIFIED WITH: Joeseph Amor RN Performed at [facility] [address], [place], [place] [address]   MRSA Next Gen by PCR, Nasal     Status: None   Collection Time: 07/24/21  3:27 PM   Specimen: Nasal Mucosa; Nasal Swab  Result Value Ref Range Status   MRSA by PCR Next Gen NOT DETECTED NOT DETECTED Final    Comment: (NOTE) The GeneXpert MRSA Assay (FDA approved for NASAL specimens only), is one component of a comprehensive MRSA colonization surveillance program. It is not intended to diagnose MRSA infection nor to guide or monitor treatment for MRSA infections. Test performance is not FDA approved in patients less than 51 years old. Performed at [facility], [place] [address], [place], [place] [address]           Radiology Studies: CT ABDOMEN PELVIS WO CONTRAST  Result Date: 07/28/2021 CLINICAL DATA:  Acute generalized abdominal pain.  Elevated lipase. EXAM: CT ABDOMEN AND PELVIS WITHOUT CONTRAST TECHNIQUE: Multidetector CT imaging of the abdomen and pelvis was performed following the standard protocol without IV contrast. COMPARISON:  July 17, 2021. FINDINGS: Lower chest: Small bilateral pleural effusions are noted with adjacent subsegmental atelectasis. Hepatobiliary: No focal abnormality is noted in the liver on these unenhanced images. Left hepatic pneumobilia is noted as well as air noted in common bile duct and cystic duct. Large amount of air is also noted in the gallbladder lumen. Moderate gallbladder wall thickening is noted without cholelithiasis, concerning for cholecystitis. There appears to be a biliary stent in the second portion of the duodenum. Pancreas: Unremarkable. No pancreatic ductal dilatation or surrounding inflammatory changes. Spleen: Normal in size without focal abnormality. Adrenals/Urinary Tract: Adrenal glands appear normal. Nonobstructive right renal calculus is noted. No hydronephrosis or  renal obstruction is noted. Urinary bladder is unremarkable. Stomach/Bowel: Stomach is within normal limits. Appendix appears normal. No evidence of bowel wall thickening, distention, or inflammatory changes. Vascular/Lymphatic: Aortic atherosclerosis. No enlarged abdominal or pelvic lymph nodes. Reproductive: Prostate is unremarkable. Other: Minimal ascites is noted.  No definite hernia is noted. Musculoskeletal: No acute or significant osseous findings. IMPRESSION: Small bilateral pleural effusions are noted with adjacent subsegmental atelectasis. Extensive pneumobilia is noted in the left hepatic ducts as well as in the common bile and cystic ducts. Large amount of air is also noted in the gallbladder lumen. This most likely is due to recent instrumentation. Moderate gallbladder wall thickening is noted without cholelithiasis concerning for cholecystitis. Nonobstructive right renal calculus. Minimal ascites. Aortic Atherosclerosis (ICD10-I70.0). Electronically Signed   By: Marijo Conception M.D.   On: 07/28/2021 19:57   MR BRAIN WO CONTRAST  Result Date: 07/29/2021 CLINICAL DATA:  Initial evaluation for mental status change. EXAM: MRI HEAD WITHOUT CONTRAST TECHNIQUE: Multiplanar, multiecho pulse sequences of the brain and surrounding structures were obtained without intravenous contrast. COMPARISON:  Head CT from 07/22/2021. FINDINGS: Brain: Diffuse prominence of the CSF containing spaces compatible with generalized cerebral atrophy. No significant cerebral white matter disease for age. No abnormal foci of restricted diffusion to suggest acute or subacute ischemia. Gray-white matter differentiation maintained. No encephalomalacia to suggest chronic cortical infarction. No acute intracranial hemorrhage. Single punctate chronic microhemorrhage noted within the right frontal centrum semi ovale, of doubtful significance in isolation. No mass lesion, midline shift or mass effect. Mild ventricular prominence related  to global parenchymal volume loss without hydrocephalus. No extra-axial fluid collection. Pituitary gland suprasellar region normal. Midline structures intact and normal. Vascular: Right vertebral artery  hypoplastic and not well seen. Major intracranial vascular flow voids otherwise maintained. Skull and upper cervical spine: Craniocervical junction within normal limits. Bone marrow signal intensity within normal limits. No scalp soft tissue abnormality. Sinuses/Orbits: Globes and orbital soft tissues demonstrate no acute finding. Paranasal sinuses are largely clear. No significant mastoid effusion. Inner ear structures grossly normal. Other: None. IMPRESSION: 1. No acute intracranial abnormality. 2. Mildly advanced cerebral atrophy for age. Otherwise unremarkable brain MRI. Electronically Signed   By: Jeannine Boga M.D.   On: 07/29/2021 00:30        Scheduled Meds:  Chlorhexidine Gluconate Cloth  6 each Topical Daily   insulin aspart  0-9 Units Subcutaneous TID WC   mouth rinse  15 mL Mouth Rinse BID   pantoprazole  40 mg Oral BID   rosuvastatin  40 mg Oral QHS   vancomycin  125 mg Oral QID   Continuous Infusions:  sodium chloride     lactated ringers       LOS: 12 days        Hosie Poisson, MD [facility]   To contact the attending provider between 7A-7P or the covering provider during after hours 7P-7A, please log into the web site [email or web address] and access using universal [place] password for that web site. If you do not have the password, please call the hospital operator.  07/30/2021, 9:04 AM

## 2021-07-30 NOTE — Consult Note (Signed)
Consultation Note Date: 07/30/2021   Patient Name: [name] [name]  DOB: 09/06/1951  MRN: [id number]  Age / Sex: 70 y.o., male  PCP: Raeanne Gathers, MD Referring Physician: Hosie Poisson, MD  Reason for Consultation: Establishing goals of care  HPI/Patient Profile: 70 y.o. male  with past medical history of hypertension, diabetes, chronic systolic heart failure, stage III CKD, paroxysmal A. fib, CAD admitted on 07/17/2021 with acute cholecystitis and mild pancreatitis.  He was started on broad-spectrum antibiotics and general surgery and GI were consulted.  He was being evaluated for cholecystectomy but due to increased surgical risk, decision was made to proceed with conservative treatment with antibiotics.  He had a complicated course with diarrhea found to be C. difficile and GI bleed (potentially possibly second Eliquis) he had a EGD revealed bleeding vessel at the sphincterotomy site and Endo Clip was placed on 9/4.  He had a short stay in the ICU.  Echo reveals concern for amyloidosis.  Palliative consulted for goals of care.   Clinical Assessment and Goals of Care: I met today with Mr. Stonehocker.  I introduced palliative care as specialized medical care for people living with serious illness. It focuses on providing relief from the symptoms and stress of a serious illness. The goal is to improve quality of life for both the patient and the family.  We discussed clinical course as well as wishes moving forward in regard to advanced directives.  Concepts specific to code status and rehospitalization discussed.  Values and goals of care important to patient and family were attempted to be elicited.  He tells me that he is really just trying to figure out what is going on with his body and we discussed plan for further testing as an outpatient to evaluate for amyloidosis.  We discussed the continued declines  he is seen in his nutrition, cognition, and functional status.  We discussed plan to transition to skilled facility to see how much he can regain functioning.  We also talked about the fact that he has chronic progressive illness is not going to go away and he will continue to suffer from progression over time.  He expressed understanding these things.   Questions and concerns addressed.   PMT will continue to support holistically.   SUMMARY OF RECOMMENDATIONS   -Full code/full scope -Plan for transition to skilled facility at time of discharge. -Discussed goals and he needs to know results of further testing for amyloidosis and discussed with his outpatient heart failure provider prior to making decisions.   -Recommend outpatient palliative care to follow at skilled facility.  Code Status/Advance Care Planning: Full code  Prognosis:  Unable to determine  Discharge Planning: [facility] for rehab with Palliative care service follow-up      Primary Diagnoses: Present on Admission:  Acute cholecystitis  Paroxysmal atrial fibrillation ([place])  Essential hypertension   I have reviewed the medical record, interviewed the patient and family, and examined the patient. The following aspects are pertinent.  Past Medical History:  Diagnosis Date   Acute on chronic congestive heart failure (HCC)    Acute on chronic systolic CHF (congestive heart failure) ([place]) 05/04/2021   Arthritis of left knee 12/02/2020   Bilateral leg edema 05/04/2017   CAD (coronary artery disease)    nonST elevated MI in Oct 2008 treated w/2 drug -eluting stents to the RCA and  mid LAD, EF 55%   CAD (coronary artery disease), native coronary artery 11/12/2018   Formatting of this note might be different from the original. S/p MI - Dr. 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 Hypertension  10/1 IMO update   Gout, unspecified 10/14/2007   Overview:  Gout  10/1 IMO update  Formatting of this note might be different from the original. Gout  10/1 IMO update   History of colonic polyps 11/12/2018   Overview:  rpt colonoscopy 5/17; Dr. Haig Prophet of this note might be different from the original. rpt colonoscopy 5/17; Dr. Erlene Quan   History of kidney stones    History of nephrolithiasis 11/12/2018   History of smoking 11/12/2018   Overview:  quit 2000  Formatting of this note might be different from the original. quit 2000   HTN (hypertension)    Hyperlipidemia    HYPERTENSION, BENIGN 06/15/2009   Inguinal hernia without mention of obstruction or gangrene, recurrent unilateral or unspecified 11/12/2018   Ischemic cardiomyopathy 08/12/2020   Medication intolerance 11/12/2018   Mixed hyperlipidemia 11/12/2018   Old myocardial infarction 11/12/2018   Paroxysmal atrial fibrillation ([place]) 07/16/2020   Pre-operative cardiovascular examination 11/12/2018   Primary osteoarthritis of both knees 03/05/2017   Considering Zilretta injections. Good improvement with corticosteroid injections however short-lived. Mild improvement with Visco supplementation.   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 Diabetes Mellitus   Unilateral  primary osteoarthritis, right knee 11/15/2018   Social History   Socioeconomic History   Marital status: Widowed    Spouse name: Not on file   Number of children: Not on file   Years of education: Not on file   Highest education level: Not on file  Occupational History   Not on file  Tobacco Use   Smoking status: Former    Packs/day: 0.50    Years: 10.00    Pack years: 5.00    Types: Cigarettes    Quit date: 52    Years since quitting: 32.7   Smokeless tobacco: Former    Types: Snuff, Chew    Quit date: 11/20/1968   Tobacco comments:    in his 20's  Vaping Use   Vaping Use: Never used  Substance and Sexual Activity   Alcohol use: No   Drug use: No   Sexual activity: Yes  Other Topics Concern   Not  on file  Social History Narrative   Not on file   Social Determinants of Health   Financial Resource Strain: Low Risk    Difficulty of Paying Living Expenses: Not hard at all  Food Insecurity: No Food Insecurity   Worried About Charity fundraiser in the Last Year: Never true   Arboriculturist in the Last Year: Never true  Transportation Needs: No Transportation Needs   Lack of Transportation (Medical): No   Lack of Transportation (Non-Medical): No  Physical Activity: Insufficiently Active   Days of Exercise per Week: 3 days   Minutes of Exercise per Session: 10 min  Stress: Stress Concern Present   Feeling of Stress : Very much  Social Connections: Not on file   Family History  Problem Relation Age of Onset   Hypertension Mother    Cancer Mother    Heart attack Father    Hypertension Father    Diabetes Father    Hypertension Brother    Irregular heart beat Brother    Scheduled Meds:  Chlorhexidine Gluconate Cloth  6 each Topical Daily   insulin aspart  0-9 Units Subcutaneous TID WC   mouth rinse  15 mL Mouth Rinse BID   pantoprazole  40 mg Oral BID   rosuvastatin  40 mg Oral QHS   vancomycin  125 mg Oral QID   Continuous Infusions:  sodium chloride      lactated ringers     PRN Meds:.acetaminophen **OR** acetaminophen, alum & mag hydroxide-simeth, HYDROmorphone (DILAUDID) injection, naphazoline-glycerin, ondansetron (ZOFRAN) IV, Zinc Oxide Medications Prior to Admission:  Prior to Admission medications   Medication Sig Start Date End Date Taking? Authorizing Provider  ALPRAZolam Duanne Moron) 0.25 MG tablet Take 0.25 mg by mouth at bedtime as needed for anxiety.  11/27/16  Yes [provider]  apixaban (ELIQUIS) 5 MG TABS tablet Take 1 tablet (5 mg total) by mouth 2 (two) times daily. 05/06/21 07/18/21 Yes Elodia Florence., MD  colchicine 0.6 MG tablet Take 0.6 mg by mouth daily as needed for other. Gout flare up 05/02/21  Yes [provider]  cyanocobalamin (,VITAMIN B-12,) 1000 MCG/ML injection Inject 1,000 mcg into the muscle every 30 (thirty) days.  05/12/16  Yes [provider]  dapagliflozin propanediol (FARXIGA) 10 MG TABS tablet Take 1 tablet (10 mg total) by mouth at bedtime. 05/11/21  Yes Katherine Roan, MD  furosemide (LASIX) 40 MG tablet Take 1 tablet (40 mg total) by mouth daily. 05/30/21  Yes Katherine Roan, MD  LANTUS SOLOSTAR 100 UNIT/ML Solostar Pen Inject 18 Units into the skin at bedtime. 03/14/20  Yes [provider]  levalbuterol (XOPENEX HFA) 45 MCG/ACT inhaler Inhale 1 puff into the lungs every 4 (four) hours as needed for wheezing.   Yes [provider]  potassium chloride SA (KLOR-CON) 20 MEQ tablet Take 1 tablet (20 mEq total) by mouth daily. 05/30/21  Yes Katherine Roan, MD  rosuvastatin (CRESTOR) 40 MG tablet Take 1 tablet (40 mg total) by mouth at bedtime. 05/30/21  Yes Katherine Roan, MD  spironolactone (ALDACTONE) 25 MG tablet Take 1 tablet (25 mg total) by mouth at bedtime. 07/04/21  Yes Bensimhon, Shaune Pascal, MD  traMADol (ULTRAM) 50 MG tablet Take 25 mg by mouth every 4 (four) hours as needed for pain or severe pain. 11/13/20  Yes [provider]   albuterol (VENTOLIN HFA) 108 (90 Base) MCG/ACT inhaler Inhale 2 puffs into the lungs every 6 (  six) hours as needed for shortness of breath. 05/03/21   [provider]  CIALIS 20 MG tablet Take 20 mg by mouth daily as needed for erectile dysfunction.  Patient not taking: Reported on 07/18/2021 02/06/17   [provider]  fluticasone (FLONASE) 50 MCG/ACT nasal spray Place 1 spray into both nostrils daily as needed for allergies or rhinitis.    [provider]   Allergies  Allergen Reactions   Allopurinol Other (See Comments)    Felt terrible   Aripiprazole Other (See Comments)    Felt terrible   Bupropion Other (See Comments)    Felt bad   Buspirone Other (See Comments)    Felt bad   Desvenlafaxine Other (See Comments)    Made him hyper   Duloxetine Other (See Comments)    Knocked him out   Escitalopram Other (See Comments)    Felt like crap   Fish Oil     unknown   Fluticasone Furoate Other (See Comments)    Unknown    Hydroxyzine Hcl Other (See Comments)    Knocked him out   Nortriptyline Other (See Comments)    Ineffective   Paroxetine Hcl Other (See Comments)    unknown   Ramipril Cough   Sertraline Other (See Comments)    "felt plugged in"   Venlafaxine Other (See Comments)    Felt bad   Vilazodone Other (See Comments)    "felt weird"   Review of Systems  Constitutional:  Positive for activity change and fatigue.  Neurological:  Positive for weakness.  Psychiatric/Behavioral:  Positive for sleep disturbance.    Physical Exam General: Alert, awake, in no acute distress.   HEENT: No bruits, no goiter, no JVD Heart: Regular rate and rhythm. No murmur appreciated. Lungs: Good air movement, clear Abdomen: Soft, nontender, nondistended, positive bowel sounds.   Ext: No significant edema Skin: Warm and dry Neuro: Grossly intact, nonfocal.   Vital Signs: BP 103/64 (BP Location: Left Arm)   Pulse 67   Temp 97.8 F (36.6 C) (Oral)   Resp  15   Ht _0  (1.854 m)   Wt 93 kg   SpO2 100%   BMI 27.05 kg/m  Pain Scale: 0-10 POSS *See Group Information*: S-Acceptable,Sleep, easy to arouse Pain Score: 3    SpO2: SpO2: 100 % O2 Device:SpO2: 100 % O2 Flow Rate: .O2 Flow Rate (L/min): 4 L/min  IO: Intake/output summary:  Intake/Output Summary (Last 24 hours) at 07/30/2021 1019 Last data filed at 07/29/2021 1900 Gross per 24 hour  Intake --  Output 1 ml  Net -1 ml    LBM: Last BM Date: 07/26/21 Baseline Weight: Weight: 101 kg Most recent weight: Weight: 93 kg     Palliative Assessment/Data:   Flowsheet Rows    Flowsheet Row Most Recent Value  Intake Tab   Referral Department Hospitalist  Unit at Time of Referral Med/Surg Unit  Palliative Care Primary Diagnosis Cardiac  Date Notified 07/27/21  Palliative Care Type New Palliative care  Reason for referral Clarify Goals of Care  Date of Admission 07/17/21  Date first seen by Palliative Care 07/29/21  # of days Palliative referral response time 2 Day(s)  # of days IP prior to Palliative referral 10  Clinical Assessment   Palliative Performance Scale Score 50%  Psychosocial & Spiritual Assessment   Palliative Care Outcomes   Patient/Family meeting held? Yes  Who was at the meeting? Patient  Palliative Care Outcomes Clarified goals of care  Time In: 1600 Time Out: 1700 Time Total: 60 Greater than 50%  of this time was spent counseling and coordinating care related to the above assessment and plan.  Signed by: Micheline Rough, MD   Please contact Palliative Medicine Team phone at [phone] for questions and concerns.  For individual provider: See Shea Evans

## 2021-07-31 DIAGNOSIS — K810 Acute cholecystitis: Secondary | ICD-10-CM | POA: Diagnosis not present

## 2021-07-31 DIAGNOSIS — R0603 Acute respiratory distress: Secondary | ICD-10-CM | POA: Diagnosis not present

## 2021-07-31 DIAGNOSIS — K8050 Calculus of bile duct without cholangitis or cholecystitis without obstruction: Secondary | ICD-10-CM | POA: Diagnosis not present

## 2021-07-31 LAB — GLUCOSE, CAPILLARY
Glucose-Capillary: 146 mg/dL — ABNORMAL HIGH (ref 70–99)
Glucose-Capillary: 161 mg/dL — ABNORMAL HIGH (ref 70–99)
Glucose-Capillary: 186 mg/dL — ABNORMAL HIGH (ref 70–99)
Glucose-Capillary: 188 mg/dL — ABNORMAL HIGH (ref 70–99)
Glucose-Capillary: 288 mg/dL — ABNORMAL HIGH (ref 70–99)

## 2021-07-31 LAB — BASIC METABOLIC PANEL
Anion gap: 7 (ref 5–15)
BUN: 30 mg/dL — ABNORMAL HIGH (ref 8–23)
CO2: 26 mmol/L (ref 22–32)
Calcium: 8.5 mg/dL — ABNORMAL LOW (ref 8.9–10.3)
Chloride: 102 mmol/L (ref 98–111)
Creatinine, Ser: 1.88 mg/dL — ABNORMAL HIGH (ref 0.61–1.24)
GFR, Estimated: 38 mL/min — ABNORMAL LOW (ref >60)
Glucose, Bld: 160 mg/dL — ABNORMAL HIGH (ref 70–99)
Potassium: 4.3 mmol/L (ref 3.5–5.1)
Sodium: 135 mmol/L (ref 135–145)

## 2021-07-31 LAB — CBC
HCT: 25.3 % — ABNORMAL LOW (ref 39.0–52.0)
Hemoglobin: 8 g/dL — ABNORMAL LOW (ref 13.0–17.0)
MCH: 26.4 pg (ref 26.0–34.0)
MCHC: 31.6 g/dL (ref 30.0–36.0)
MCV: 83.5 fL (ref 80.0–100.0)
Platelets: 161 10*3/uL (ref 150–400)
RBC: 3.03 MIL/uL — ABNORMAL LOW (ref 4.22–5.81)
RDW: 22.4 % — ABNORMAL HIGH (ref 11.5–15.5)
WBC: 8 10*3/uL (ref 4.0–10.5)
nRBC: 0.4 % — ABNORMAL HIGH (ref 0.0–0.2)

## 2021-07-31 MED ORDER — TRAMADOL HCL 50 MG PO TABS
50.0000 mg | ORAL_TABLET | Freq: Four times a day (QID) | ORAL | Status: DC | PRN
Start: 2021-07-31 — End: 2021-08-04
  Administered 2021-07-31 – 2021-08-04 (×4): 50 mg via ORAL
  Filled 2021-07-31 (×4): qty 1

## 2021-07-31 MED ORDER — MELATONIN 5 MG PO TABS
5.0000 mg | ORAL_TABLET | Freq: Once | ORAL | Status: AC
  Administered 2021-08-01: 5 mg via ORAL
  Filled 2021-07-31: qty 1

## 2021-07-31 NOTE — Progress Notes (Signed)
PROGRESS NOTE    [name] [name]  GUY:[id number] DOB: [birth year] DOA: 07/17/2021 PCP: Raeanne Gathers, MD    Chief Complaint  Patient presents with   Abdominal Pain    Brief Narrative:  70 year old gentleman prior history of hypertension, diabetes, chronic systolic heart failure, stage IIIa CKD, paroxysmal atrial fibrillation, coronary artery disease presents with abdominal pain and diarrhea patient was found to have acute cholecystitis with choledocholithiasis and mild pancreatitis.  He was started on broad-spectrum antibiotics and general surgery, GI consulted.  In view of increased surgical risk patient did not undergo a cholecystectomy.  He was treated conservatively with antibiotics.  Hospital course complicated by diarrhea which was found to be C. difficile diarrhea, GI bleed probably secondary to Eliquis.  Patient received Kcentra for Eliquis reversal, GI consulted underwent EGD found to have a bleeding vessel at the sphincterotomy site which was endoclipped on 07/24/2021.  He was transferred to ICU for hemorrhagic shock briefly requiring Levophed.  He is currently off all pressors. Pt seen and examined this am.  His hemoglobin is down to 8 today. Will request Gi input.  Only one BM , no malena as per the patient. He reports no appetite.   Assessment & Plan:   Principal Problem:   Acute cholecystitis Active Problems:   Essential hypertension   Paroxysmal atrial fibrillation (HCC)   Choledocholithiasis   Pancreatitis   Prolonged QT interval   Diabetes mellitus type 2, uncomplicated (HCC)   CKD (chronic kidney disease) stage 3, GFR 30-59 ml/min (HCC)   Thrombocytopenia (HCC)   Leukocytosis   Chronic systolic CHF (congestive heart failure) (HCC)   Hyperbilirubinemia   Acute cholecystitis/choledocholithiasis/pain biliary pancreatitis S/p ERCP showing choledocholithiasis underwent stone removal with sphincterotomy.  Cholecystectomy was scheduled however it was canceled as  cardiology could not clear him for surgery.  General surgery has signed off and recommended outpatient follow-up in the office. Patient has received 3 days of IV Zosyn which was discontinued followed by oral Augmentin for 2 more days. Pt reports occasional intermittent abd pain, resolves spontaneously. Repeat labs show elevated lipase, elevated alk phos and mild elevation in bilirubin. Repeat CT abdomen and pelvis shows  Extensive pneumobilia is noted in the left hepatic ducts as well as in the common bile and cystic ducts. Large amount of air is also noted in the gallbladder lumen. This most likely is due to recent instrumentation. Moderate gallbladder wall thickening is noted without cholelithiasis concerning for cholecystitis. Pt reprots no appetite, no nausea or vomiting , only one BM today.     Mild acute on chronic combined systolic and diastolic heart failure Echocardiogram showed left ventricular ejection fraction of 20 to 25% with severe right ventricular dysfunction Cardiology on board and has ordered a PYP scan as outpatient to evaluate for amyloid, SPEP, UPEP and light chains have been ordered. Recommend outpatient follow up.  He was initially started on IV Lasix for fluid overload but has been discontinued due to worsening renal parameters, creatinine remains high around 1.9. Pt remains off lasix for now.  Outpatient follow up with cardiology.     Mild thrombocytopenia. Much improved.    Lower GI bleed in the setting of C. difficile colitis Patient was on Eliquis for anticoagulation for atrial fibrillation which was reversed with Kcentra.  GI consulted patient underwent EGD was found to have a bleeding all vessel at the sphincterotomy site underwent Endo clipping of the bleeding vessel.  Patient was transferred to ICU for hemorrhagic shock and was on Levophed  briefly.  Patient currently off all pressors and hemoglobin stable between 8 and 9. No bleeding seen. But hemoglobin  continues to drop . Its around 8 today.  Requested [facility] input, recommended to get in touch with Dr Benson Norway in am.     C. difficile diarrhea Much improved. Pt is on oral vancomycin.     Type 2 diabetes mellitus Continue with sliding scale insulin at this time. CBG (last 3)  Recent Labs    07/31/21 0005 07/31/21 0624 07/31/21 1144  GLUCAP 188* 146* 288*   Hemoglobin A1c is 6.6%. No changes in meds.     Mild acute on stage IIIb CKD Creatinine slighlty up to 1.9. not on any diuretics or nephrotoxins.  Continue to monitor. No changes in meds.      Paroxysmal atrial fibrillation Rate controlled and on Eliquis which has been on hold for now. Unable to start on eliquis as pt 's hemoglobin continues to drop.     Coronary artery disease s/p NSTEMI in 2008 s/p stent to RCA and LAD Continue with statin at this time   Mild acute encephalopathy: Probably sec to acute illness. Pt appears to be at baseline.  MRI brain is negative for acute stroke.     In view of multiple medical problems, deconditioning and debility will request palliative care for goals of care discussion. Pt is inclined towards DNR, BUT will let us know tomorrow.     DVT prophylaxis: SCDs Code Status: Full code Family Communication: (None at bedside) Disposition:   Status is: Inpatient  Remains inpatient appropriate because:Ongoing diagnostic testing needed not appropriate for outpatient work up, Unsafe d/c plan, and IV treatments appropriate due to intensity of illness or inability to take PO  Dispo: The patient is from: Home              Anticipated d/c is to: SNF              Patient currently is not medically stable to d/c.   Difficult to place patient No       Consultants:  Critical care PCCM  Cardiology Gastroenterology Procedures: .none.  Antimicrobials:  Antibiotics Given (last 72 hours)     Date/Time Action Medication Dose   07/28/21 1423 Given   vancomycin (VANCOCIN)  capsule 125 mg 125 mg   07/28/21 1834 Given   vancomycin (VANCOCIN) capsule 125 mg 125 mg   07/28/21 2258 Given   vancomycin (VANCOCIN) capsule 125 mg 125 mg   07/29/21 1008 Given   vancomycin (VANCOCIN) capsule 125 mg 125 mg   07/29/21 1525 Given   vancomycin (VANCOCIN) capsule 125 mg 125 mg   07/29/21 1813 Given   vancomycin (VANCOCIN) capsule 125 mg 125 mg   07/30/21 0003 Given   vancomycin (VANCOCIN) capsule 125 mg 125 mg   07/30/21 1025 Given   vancomycin (VANCOCIN) capsule 125 mg 125 mg   07/30/21 1516 Given   vancomycin (VANCOCIN) capsule 125 mg 125 mg   07/30/21 1838 Given   vancomycin (VANCOCIN) capsule 125 mg 125 mg   07/30/21 2116 Given   vancomycin (VANCOCIN) capsule 125 mg 125 mg   07/31/21 1248 Given   vancomycin (VANCOCIN) capsule 125 mg 125 mg         Subjective: No appetite today. No chest pain, some abdominal discomfort today.  Only BM today.   Objective: Vitals:   07/30/21 1356 07/30/21 2126 07/31/21 0619 07/31/21 1100  BP: 120/77 106/67 103/66 99/60  Pulse: 83 83 68  Resp: 16 17 17 18   Temp: 98.7 F (37.1 C) 97.7 F (36.5 C) 98 F (36.7 C) 97.8 F (36.6 C)  TempSrc:  Oral Oral Oral  SpO2: 95% 98% 99% 99%  Weight:      Height:        Intake/Output Summary (Last 24 hours) at 07/31/2021 1320 Last data filed at 07/31/2021 1145 Gross per 24 hour  Intake 355 ml  Output 875 ml  Net -520 ml    Filed Weights   07/27/21 0500 07/28/21 0544 07/29/21 0512  Weight: 96.9 kg 97 kg 93 kg    Examination:  General exam: Appears calm and comfortable  Respiratory system: Clear to auscultation. Respiratory effort normal. Cardiovascular system: S1 & S2 heard, RRR. No JVD, murmurs, No pedal edema. Gastrointestinal system: Abdomen is soft, Non tender, Non distended, Bowel sounds wnl Central nervous system: Alert and oriented. No focal neurological deficits. Extremities: Symmetric 5 x 5 power. Skin: No rashes, lesions or ulcers Psychiatry: Mood &  affect appropriate.       Data Reviewed: I have personally reviewed following labs and imaging studies  CBC: Recent Labs  Lab 07/25/21 2219 07/27/21 0730 07/28/21 0516 07/28/21 1223 07/29/21 0538 07/31/21 0607  WBC 14.4* 12.3* 8.9  --  9.9 8.0  HGB 9.1* 9.6* 8.8* 9.1* 8.5* 8.0*  HCT 28.4* 33.1* 28.6* 29.8* 27.0* 25.3*  MCV 82.3 87.8 84.6  --  83.6 83.5  PLT 128* 111* 144*  --  149* 161     Basic Metabolic Panel: Recent Labs  Lab 07/25/21 2219 07/27/21 0936 07/28/21 0516 07/29/21 0538 07/30/21 0919  NA 139 137 142 137 136  K 4.1 4.3 4.3 4.3 4.4  CL 103 100 106 98 102  CO2 28 30 32 26 27  GLUCOSE 165* 139* 135* 116* 152*  BUN 69* 45* 37* 35* 33*  CREATININE 1.92* 1.60* 1.69* 1.77* 1.93*  CALCIUM 8.6* 8.5* 8.7* 8.4* 8.6*  MG 2.2  --   --   --   --      GFR: Estimated Creatinine Clearance: 41.4 mL/min (A) (by C-G formula based on SCr of 1.93 mg/dL (H)).  Liver Function Tests: Recent Labs  Lab 07/28/21 1223 07/29/21 0538 07/30/21 0919  AST 39 45* 53*  ALT 25 24 23   ALKPHOS 316* 302* 285*  BILITOT 2.0* 2.1* 1.7*  PROT 5.6* 5.3* 5.7*  ALBUMIN 2.4* 2.4* 2.5*     CBG: Recent Labs  Lab 07/30/21 1133 07/30/21 1721 07/31/21 0005 07/31/21 0624 07/31/21 1144  GLUCAP 157* 168* 188* 146* 288*      Recent Results (from the past 240 hour(s))  C Difficile Quick Screen (NO PCR Reflex)     Status: Abnormal   Collection Time: 07/23/21  9:20 AM   Specimen: STOOL  Result Value Ref Range Status   C Diff antigen POSITIVE (A) NEGATIVE Final   C Diff toxin POSITIVE (A) NEGATIVE Final   C Diff interpretation Toxin producing C. difficile detected.  Final    Comment: CRITICAL RESULT CALLED TO, READ BACK BY AND VERIFIED WITH: Joeseph Amor RN Performed at [facility] [address], [place], [place] [address]   MRSA Next Gen by PCR, Nasal     Status: None   Collection Time: 07/24/21  3:27 PM   Specimen: Nasal Mucosa; Nasal Swab  Result  Value Ref Range Status   MRSA by PCR Next Gen NOT DETECTED NOT DETECTED Final    Comment: (NOTE) The GeneXpert MRSA Assay (FDA  approved for NASAL specimens only), is one component of a comprehensive MRSA colonization surveillance program. It is not intended to diagnose MRSA infection nor to guide or monitor treatment for MRSA infections. Test performance is not FDA approved in patients less than 61 years old. Performed at [facility] [address], [place], [place] [address]           Radiology Studies: No results found.      Scheduled Meds:  Chlorhexidine Gluconate Cloth  6 each Topical Daily   insulin aspart  0-9 Units Subcutaneous TID WC   mouth rinse  15 mL Mouth Rinse BID   pantoprazole  40 mg Oral BID   rosuvastatin  40 mg Oral QHS   vancomycin  125 mg Oral QID   Continuous Infusions:  sodium chloride     lactated ringers       LOS: 13 days        Hosie Poisson, MD [facility]   To contact the attending provider between 7A-7P or the covering provider during after hours 7P-7A, please log into the web site [email or web address] and access using universal [place] password for that web site. If you do not have the password, please call the hospital operator.  07/31/2021, 1:20 PM

## 2021-08-01 DIAGNOSIS — K8080 Other cholelithiasis without obstruction: Secondary | ICD-10-CM | POA: Diagnosis not present

## 2021-08-01 DIAGNOSIS — I480 Paroxysmal atrial fibrillation: Secondary | ICD-10-CM | POA: Diagnosis not present

## 2021-08-01 LAB — CBC WITH DIFFERENTIAL/PLATELET
Abs Immature Granulocytes: 0.03 10*3/uL (ref 0.00–0.07)
Basophils Absolute: 0.1 10*3/uL (ref 0.0–0.1)
Basophils Relative: 1 %
Eosinophils Absolute: 0.1 10*3/uL (ref 0.0–0.5)
Eosinophils Relative: 1 %
HCT: 26.9 % — ABNORMAL LOW (ref 39.0–52.0)
Hemoglobin: 8.4 g/dL — ABNORMAL LOW (ref 13.0–17.0)
Immature Granulocytes: 0 %
Lymphocytes Relative: 11 %
Lymphs Abs: 0.9 10*3/uL (ref 0.7–4.0)
MCH: 26.3 pg (ref 26.0–34.0)
MCHC: 31.2 g/dL (ref 30.0–36.0)
MCV: 84.3 fL (ref 80.0–100.0)
Monocytes Absolute: 0.5 10*3/uL (ref 0.1–1.0)
Monocytes Relative: 6 %
Neutro Abs: 7.2 10*3/uL (ref 1.7–7.7)
Neutrophils Relative %: 81 %
Platelets: 143 10*3/uL — ABNORMAL LOW (ref 150–400)
RBC: 3.19 MIL/uL — ABNORMAL LOW (ref 4.22–5.81)
RDW: 21.8 % — ABNORMAL HIGH (ref 11.5–15.5)
WBC: 8.8 10*3/uL (ref 4.0–10.5)
nRBC: 0.2 % (ref 0.0–0.2)

## 2021-08-01 LAB — COMPREHENSIVE METABOLIC PANEL
ALT: 31 U/L (ref 0–44)
AST: 123 U/L — ABNORMAL HIGH (ref 15–41)
Albumin: 2.4 g/dL — ABNORMAL LOW (ref 3.5–5.0)
Alkaline Phosphatase: 241 U/L — ABNORMAL HIGH (ref 38–126)
Anion gap: 7 (ref 5–15)
BUN: 26 mg/dL — ABNORMAL HIGH (ref 8–23)
CO2: 26 mmol/L (ref 22–32)
Calcium: 8.1 mg/dL — ABNORMAL LOW (ref 8.9–10.3)
Chloride: 97 mmol/L — ABNORMAL LOW (ref 98–111)
Creatinine, Ser: 1.87 mg/dL — ABNORMAL HIGH (ref 0.61–1.24)
GFR, Estimated: 39 mL/min — ABNORMAL LOW (ref >60)
Glucose, Bld: 203 mg/dL — ABNORMAL HIGH (ref 70–99)
Potassium: 3.8 mmol/L (ref 3.5–5.1)
Sodium: 130 mmol/L — ABNORMAL LOW (ref 135–145)
Total Bilirubin: 1.7 mg/dL — ABNORMAL HIGH (ref 0.3–1.2)
Total Protein: 5.5 g/dL — ABNORMAL LOW (ref 6.5–8.1)

## 2021-08-01 LAB — RESP PANEL BY RT-PCR (FLU A&B, COVID) ARPGX2
Influenza A by PCR: NEGATIVE
Influenza B by PCR: NEGATIVE
SARS Coronavirus 2 by RT PCR: NEGATIVE

## 2021-08-01 LAB — GLUCOSE, CAPILLARY
Glucose-Capillary: 160 mg/dL — ABNORMAL HIGH (ref 70–99)
Glucose-Capillary: 178 mg/dL — ABNORMAL HIGH (ref 70–99)
Glucose-Capillary: 216 mg/dL — ABNORMAL HIGH (ref 70–99)
Glucose-Capillary: 189 mg/dL — ABNORMAL HIGH (ref 70–99)

## 2021-08-01 MED ORDER — APIXABAN 5 MG PO TABS: 5.0000 mg | ORAL_TABLET | Freq: Two times a day (BID) | ORAL | Status: DC

## 2021-08-01 MED ORDER — APIXABAN 5 MG PO TABS
5.0000 mg | ORAL_TABLET | Freq: Two times a day (BID) | ORAL | Status: DC
  Administered 2021-08-01 – 2021-08-03 (×5): 5 mg via ORAL
  Filled 2021-08-01 (×5): qty 1

## 2021-08-01 MED ORDER — MELATONIN 5 MG PO TABS
5.0000 mg | ORAL_TABLET | Freq: Once | ORAL | Status: AC
  Administered 2021-08-01: 5 mg via ORAL
  Filled 2021-08-01: qty 1

## 2021-08-01 NOTE — Progress Notes (Signed)
PROGRESS NOTE    [name] [name]  FIE:[id number] DOB: 02-28-1951 DOA: 07/17/2021 PCP: Raeanne Gathers, MD    Chief Complaint  Patient presents with   Abdominal Pain    Brief Narrative:  70 year old gentleman prior history of hypertension, diabetes, chronic systolic heart failure, stage IIIa CKD, paroxysmal atrial fibrillation, coronary artery disease presents with abdominal pain and diarrhea patient was found to have acute cholecystitis with choledocholithiasis and mild pancreatitis.  He was started on broad-spectrum antibiotics and general surgery, GI consulted.  In view of increased surgical risk patient did not undergo a cholecystectomy.  He was treated conservatively with antibiotics.  Hospital course complicated by diarrhea which was found to be C. difficile diarrhea, GI bleed probably secondary to Eliquis.  Patient received Kcentra for Eliquis reversal, GI consulted underwent EGD found to have a bleeding vessel at the sphincterotomy site which was endoclipped on 07/24/2021.  He was transferred to ICU for hemorrhagic shock briefly requiring Levophed.  He is currently off all pressors. Pt seen and examined at bedside.  Hemoglobin improved to 8.4, we will go ahead and restart him on eliquis and watch him for 2 days to see if he has recurrent bleeding. Dr Benson Norway Notified.    Assessment & Plan:   Principal Problem:   Acute cholecystitis Active Problems:   Essential hypertension   Paroxysmal atrial fibrillation (HCC)   Choledocholithiasis   Pancreatitis   Prolonged QT interval   Diabetes mellitus type 2, uncomplicated (HCC)   CKD (chronic kidney disease) stage 3, GFR 30-59 ml/min (HCC)   Thrombocytopenia (HCC)   Leukocytosis   Chronic systolic CHF (congestive heart failure) (HCC)   Hyperbilirubinemia   Acute cholecystitis/choledocholithiasis/pain biliary pancreatitis S/p ERCP showing choledocholithiasis underwent stone removal with sphincterotomy.  Cholecystectomy was scheduled  however it was canceled as cardiology could not clear him for surgery.  General surgery has signed off and recommended outpatient follow-up in the office. Patient has received 3 days of IV Zosyn which was discontinued followed by oral Augmentin for 2 more days. Pt reports occasional intermittent abd pain, resolves spontaneously. Repeat labs show elevated lipase, elevated alk phos and mild elevation in bilirubin. Repeat CT abdomen and pelvis shows  Extensive pneumobilia is noted in the left hepatic ducts as well as in the common bile and cystic ducts. Large amount of air is also noted in the gallbladder lumen. This most likely is due to recent instrumentation. Moderate gallbladder wall thickening is noted without cholelithiasis concerning for cholecystitis. Pt reports no abd pain today. No nausea, or vomiting,  1 to 2 bm yesterday.     Mild acute on chronic combined systolic and diastolic heart failure Echocardiogram showed left ventricular ejection fraction of 20 to 25% with severe right ventricular dysfunction Cardiology on board and has ordered a PYP scan as outpatient to evaluate for amyloid, SPEP, UPEP and light chains have been ordered. Recommend outpatient follow up.  He was initially started on IV Lasix for fluid overload but has been discontinued due to worsening renal parameters, creatinine remains high around 1.9. Pt remains off lasix for now.  Outpatient follow up with cardiology.     Mild thrombocytopenia. Much improved.    Lower GI bleed in the setting of C. difficile colitis Patient was on Eliquis for anticoagulation for atrial fibrillation which was reversed with Kcentra.  GI consulted patient underwent EGD was found to have a bleeding all vessel at the sphincterotomy site underwent Endo clipping of the bleeding vessel.  Patient was transferred to ICU  for hemorrhagic shock and was on Levophed briefly.  Patient currently off all pressors and hemoglobin stable between 8 and 9. No  bleeding seen. But hemoglobin continues to drop .it has dropped from 9.6 to 8.  Repeat hemoglobin is 8.4. discussed with Dr Benson Norway and restarted patient on Eliquis. We will monitor his hemoglobin.     C. difficile diarrhea Much improved. Pt is on oral vancomycin.     Type 2 diabetes mellitus Continue with sliding scale insulin at this time. CBG (last 3)  Recent Labs    08/01/21 0533 08/01/21 0833 08/01/21 1240  GLUCAP 160* 178* 216*   Hemoglobin A1c is 6.6%. No changes in meds.     Mild acute on stage IIIb CKD Creatinine slighlty up to 1.9. not on any diuretics or nephrotoxins.  Continue to monitor. No changes in meds.      Paroxysmal atrial fibrillation Rate controlled and started the patient on eliquis.     Coronary artery disease s/p NSTEMI in 2008 s/p stent to RCA and LAD Continue with statin at this time   Mild acute encephalopathy: Probably sec to acute illness. Pt appears to be at baseline.  MRI brain is negative for acute stroke.     In view of multiple medical problems, deconditioning and debility will request palliative care for goals of care discussion.     DVT prophylaxis: SCDs Code Status: Full code Family Communication: discussed with son on 07/31/21 Disposition:   Status is: Inpatient  Remains inpatient appropriate because:Ongoing diagnostic testing needed not appropriate for outpatient work up, Unsafe d/c plan, and IV treatments appropriate due to intensity of illness or inability to take PO  Dispo: The patient is from: Home              Anticipated d/c is to: SNF              Patient currently is not medically stable to d/c.   Difficult to place patient No       Consultants:  Critical care PCCM  Cardiology Gastroenterology Procedures: .none.  Antimicrobials:  Antibiotics Given (last 72 hours)     Date/Time Action Medication Dose   07/29/21 1525 Given   vancomycin (VANCOCIN) capsule 125 mg 125 mg   07/29/21 1813 Given    vancomycin (VANCOCIN) capsule 125 mg 125 mg   07/30/21 0003 Given   vancomycin (VANCOCIN) capsule 125 mg 125 mg   07/30/21 1025 Given   vancomycin (VANCOCIN) capsule 125 mg 125 mg   07/30/21 1516 Given   vancomycin (VANCOCIN) capsule 125 mg 125 mg   07/30/21 1838 Given   vancomycin (VANCOCIN) capsule 125 mg 125 mg   07/30/21 2116 Given   vancomycin (VANCOCIN) capsule 125 mg 125 mg   07/31/21 1248 Given   vancomycin (VANCOCIN) capsule 125 mg 125 mg   07/31/21 1541 Given   vancomycin (VANCOCIN) capsule 125 mg 125 mg   07/31/21 1904 Given   vancomycin (VANCOCIN) capsule 125 mg 125 mg   07/31/21 2153 Given   vancomycin (VANCOCIN) capsule 125 mg 125 mg   08/01/21 0848 Given   vancomycin (VANCOCIN) capsule 125 mg 125 mg   08/01/21 1413 Given   vancomycin (VANCOCIN) capsule 125 mg 125 mg         Subjective: No nausea, or vomiting.   Objective: Vitals:   07/31/21 1100 07/31/21 2126 08/01/21 0530 08/01/21 0540  BP: 99/60 (!) 100/59  106/62  Pulse:  73  62  Resp: 18 15  16  Temp: 97.8 F (36.6 C) 97.8 F (36.6 C)  98.1 F (36.7 C)  TempSrc: Oral Oral  Oral  SpO2: 99% 100%  100%  Weight:   92.2 kg   Height:        Intake/Output Summary (Last 24 hours) at 08/01/2021 1453 Last data filed at 08/01/2021 0830 Gross per 24 hour  Intake 600 ml  Output 900 ml  Net -300 ml    Filed Weights   07/28/21 0544 07/29/21 0512 08/01/21 0530  Weight: 97 kg 93 kg 92.2 kg    Examination:  General exam: Appears calm and comfortable  Respiratory system: Clear to auscultation. Respiratory effort normal. Cardiovascular system: S1 & S2 heard, RRR. No JVD, murmurs, rubs, gallops or clicks. No pedal edema. Gastrointestinal system: Abdomen is nondistended, soft and nontender. Normal bowel sounds heard. Central nervous system: Alert and oriented. No focal neurological deficits. Extremities: Symmetric 5 x 5 power. Skin: No rashes, lesions or ulcers Psychiatry: flat affect.         Data Reviewed: I have personally reviewed following labs and imaging studies  CBC: Recent Labs  Lab 07/27/21 0730 07/28/21 0516 07/28/21 1223 07/29/21 0538 07/31/21 0607 08/01/21 0909  WBC 12.3* 8.9  --  9.9 8.0 8.8  NEUTROABS  --   --   --   --   --  7.2  HGB 9.6* 8.8* 9.1* 8.5* 8.0* 8.4*  HCT 33.1* 28.6* 29.8* 27.0* 25.3* 26.9*  MCV 87.8 84.6  --  83.6 83.5 84.3  PLT 111* 144*  --  149* 161 143*     Basic Metabolic Panel: Recent Labs  Lab 07/25/21 2219 07/27/21 0936 07/28/21 0516 07/29/21 0538 07/30/21 0919 07/31/21 0607 08/01/21 0909  NA 139   < > 142 137 136 135 130*  K 4.1   < > 4.3 4.3 4.4 4.3 3.8  CL 103   < > 106 98 102 102 97*  CO2 28   < > 32 _0 GLUCOSE 165*   < > 135* 116* 152* 160* 203*  BUN 69*   < > 37* 35* 33* 30* 26*  CREATININE 1.92*   < > 1.69* 1.77* 1.93* 1.88* 1.87*  CALCIUM 8.6*   < > 8.7* 8.4* 8.6* 8.5* 8.1*  MG 2.2  --   --   --   --   --   --    < > = values in this interval not displayed.     GFR: Estimated Creatinine Clearance: 42.7 mL/min (A) (by C-G formula based on SCr of 1.87 mg/dL (H)).  Liver Function Tests: Recent Labs  Lab 07/28/21 1223 07/29/21 0538 07/30/21 0919 08/01/21 0909  AST 39 45* 53* 123*  ALT _1 ALKPHOS 316* 302* 285* 241*  BILITOT 2.0* 2.1* 1.7* 1.7*  PROT 5.6* 5.3* 5.7* 5.5*  ALBUMIN 2.4* 2.4* 2.5* 2.4*     CBG: Recent Labs  Lab 07/31/21 1821 07/31/21 2351 08/01/21 0533 08/01/21 0833 08/01/21 1240  GLUCAP 186* 161* 160* 178* 216*      Recent Results (from the past 240 hour(s))  C Difficile Quick Screen (NO PCR Reflex)     Status: Abnormal   Collection Time: 07/23/21  9:20 AM   Specimen: STOOL  Result Value Ref Range Status   C Diff antigen POSITIVE (A) NEGATIVE Final   C Diff toxin POSITIVE (A) NEGATIVE Final   C Diff interpretation Toxin producing C. difficile detected.  Final    Comment: CRITICAL RESULT  CALLED TO, READ BACK BY AND VERIFIED WITH: Joeseph Amor RN Performed at [facility] [address], [place], [place] [address]   MRSA Next Gen by PCR, Nasal     Status: None   Collection Time: 07/24/21  3:27 PM   Specimen: Nasal Mucosa; Nasal Swab  Result Value Ref Range Status   MRSA by PCR Next Gen NOT DETECTED NOT DETECTED Final    Comment: (NOTE) The GeneXpert MRSA Assay (FDA approved for NASAL specimens only), is one component of a comprehensive MRSA colonization surveillance program. It is not intended to diagnose MRSA infection nor to guide or monitor treatment for MRSA infections. Test performance is not FDA approved in patients less than 31 years old. Performed at [facility], [place] [address] [place], [place] [address]   Resp Panel by RT-PCR (Flu A&B, Covid) Nasopharyngeal Swab     Status: None   Collection Time: 08/01/21  9:52 AM   Specimen: Nasopharyngeal Swab; Nasopharyngeal(NP) swabs in vial transport medium  Result Value Ref Range Status   SARS Coronavirus 2 by RT PCR NEGATIVE NEGATIVE Final    Comment: (NOTE) SARS-CoV-2 target nucleic acids are NOT DETECTED.  The SARS-CoV-2 RNA is generally detectable in upper respiratory specimens during the acute phase of infection. The lowest concentration of SARS-CoV-2 viral copies this assay can detect is 138 copies/mL. A negative result does not preclude SARS-Cov-2 infection and should not be used as the sole basis for treatment or other patient management decisions. A negative result may occur with  improper specimen collection/handling, submission of specimen other than nasopharyngeal swab, presence of viral mutation(s) within the areas targeted by this assay, and inadequate number of viral copies(<138 copies/mL). A negative result must be combined with clinical observations, patient history, and epidemiological information. The expected result is Negative.  Fact Sheet for Patients:   EntrepreneurPulse.com.au  Fact Sheet for Healthcare Providers:  IncredibleEmployment.be  This test is no t yet approved or cleared by the [place] FDA and  has been authorized for detection and/or diagnosis of SARS-CoV-2 by FDA under an Emergency Use Authorization (EUA). This EUA will remain  in effect (meaning this test can be used) for the duration of the COVID-19 declaration under Section 564(b)(1) of the Act, 21 U.S.C.section 360bbb-3(b)(1), unless the authorization is terminated  or revoked sooner.       Influenza A by PCR NEGATIVE NEGATIVE Final   Influenza B by PCR NEGATIVE NEGATIVE Final    Comment: (NOTE) The Xpert Xpress SARS-CoV-2/FLU/RSV plus assay is intended as an aid in the diagnosis of influenza from Nasopharyngeal swab specimens and should not be used as a sole basis for treatment. Nasal washings and aspirates are unacceptable for Xpert Xpress SARS-CoV-2/FLU/RSV testing.  Fact Sheet for Patients: EntrepreneurPulse.com.au  Fact Sheet for Healthcare Providers: IncredibleEmployment.be  This test is not yet approved or cleared by the [place] FDA and has been authorized for detection and/or diagnosis of SARS-CoV-2 by FDA under an Emergency Use Authorization (EUA). This EUA will remain in effect (meaning this test can be used) for the duration of the COVID-19 declaration under Section 564(b)(1) of the Act, 21 U.S.C. section 360bbb-3(b)(1), unless the authorization is terminated or revoked.  Performed at [facility], [place] [address] [place], [place] [address]           Radiology Studies: No results found.      Scheduled Meds:  apixaban  5 mg Oral BID   Chlorhexidine Gluconate Cloth  6 each Topical Daily   insulin aspart  0-9 Units Subcutaneous TID WC   mouth rinse  15 mL Mouth Rinse BID   pantoprazole  40 mg Oral BID   rosuvastatin  40 mg Oral  QHS   vancomycin  125 mg Oral QID   Continuous Infusions:  sodium chloride     lactated ringers       LOS: 14 days        Hosie Poisson, MD [facility]   To contact the attending provider between 7A-7P or the covering provider during after hours 7P-7A, please log into the web site [email or web address] and access using universal [place] password for that web site. If you do not have the password, please call the hospital operator.  08/01/2021, 2:53 PM

## 2021-08-01 NOTE — Progress Notes (Signed)
Physical Therapy Treatment Patient Details Name: [name] [name] MRN: [id number] DOB: [birth year] Today's Date: 08/01/2021   History of Present Illness 70 year old male with history of diabetes mellitus type 2, chronic systolic CHF, CKD stage III, paroxysmal atrial fibrillation, CAD, hypertension, osteoarthritis, patient presented with abdominal pain and diarrhea.  On presentation was found to have acute cholecystitis with choledocholithiasis and mild pancreatitis along with leukocytosis elevated LFTs., + C-Diff.    PT Comments    Pt continues to have very flat affect and states " why do I feel like this". Seems to be very down about situation with no positive outlook. He continues to present with great weakness and orthostatic likely due to limited time out of bed. Pt pleasant and agrees to work with PT. Encourage nursing to also get pt OOB for toileting (BSC)and to recliner daily as well.  Continue to recommend post acute rehab for strengthening.    Recommendations for follow up therapy are one component of a multi-disciplinary discharge planning process, led by the attending physician.  Recommendations may be updated based on patient status, additional functional criteria and insurance authorization.  Follow Up Recommendations  SNF     Equipment Recommendations  None recommended by PT    Recommendations for Other Services       Precautions / Restrictions Precautions Precautions: Fall Precaution Comments: c diff, frequent stool, orthostatic 08/01/2021     Mobility  Bed Mobility Overal bed mobility: Needs Assistance       Supine to sit: HOB elevated;Mod assist     General bed mobility comments: Min to Mod A for upper body and to assist to scoot bottom towards edge of bed so feet could be flat on the floor    Transfers Overall transfer level: Needs assistance Equipment used: Rolling walker (2 wheeled) Transfers: Sit to/from Stand Sit to Stand: Min guard          General transfer comment: not really needed 2+ but due to pt reporting dizziness at [facility] of bed and orthostatic, 2nd person for safety. then picot and moving feet was a lot harder for pt today then last week. [name] to weight shift for step progression today. Had to use 2 person each side to help pt unload LEs for weight shifting . 2 to 3 steps to transfer bed to recliner today.  Ambulation/Gait                 Stairs             Wheelchair Mobility    Modified Rankin (Stroke Patients Only)       Balance Overall balance assessment: Needs assistance Sitting-balance support: Feet supported Sitting balance-Leahy Scale: Fair     Standing balance support: Bilateral upper extremity supported;During functional activity Standing balance-Leahy Scale: Poor Standing balance comment: heavily reliant on UE support                            Cognition Arousal/Alertness: Awake/alert Behavior During Therapy: Flat affect (very flat, seems to not care, but cooperative.) Overall Cognitive Status: No family/caregiver present to determine baseline cognitive functioning                               Problem Solving: Slow processing;Decreased initiation;Requires verbal cues;Requires tactile cues        Exercises Other Exercises Other Exercises: Began our session with B ankle pumps 10 times each  and AAROM for heel slides supine B LE 10 times each to begin session.    General Comments        Pertinent Vitals/Pain Pain Assessment: Faces Faces Pain Scale: Hurts a little bit Pain Location: back and buttocks, raw Pain Descriptors / Indicators: Grimacing;Moaning Pain Intervention(s): Monitored during session    Home Living                      Prior Function            PT Goals (current goals can now be found in the care plan section) Acute Rehab PT Goals Patient Stated Goal: I want to get stronger and get out of this bed. PT Goal  Formulation: With patient Time For Goal Achievement: 08/07/21 Potential to Achieve Goals: Good Progress towards PT goals: Progressing toward goals    Frequency    Min 2X/week      PT Plan Current plan remains appropriate;Frequency needs to be updated    Co-evaluation              AM-PAC PT "6 Clicks" Mobility   Outcome Measure  Help needed turning from your back to your side while in a flat bed without using bedrails?: A Little Help needed moving from lying on your back to sitting on the side of a flat bed without using bedrails?: A Little Help needed moving to and from a bed to a chair (including a wheelchair)?: Total Help needed standing up from a chair using your arms (e.g., wheelchair or bedside chair)?: Total Help needed to walk in hospital room?: Total Help needed climbing 3-5 steps with a railing? : Total 6 Click Score: 10    End of Session Equipment Utilized During Treatment: Gait belt Activity Tolerance: Patient tolerated treatment well Patient left: in chair;with call bell/phone within reach Nurse Communication: Mobility status PT Visit Diagnosis: Other abnormalities of gait and mobility (R26.89);Muscle weakness (generalized) (M62.81)     Time: [id number] PT Time Calculation (min) (ACUTE ONLY): 27 min  Charges:  $Therapeutic Exercise: 23-37 mins                     [name] [name], PT, MPT Acute Rehabilitation Services Office: [phone] Pager: [phone] 08/01/2021    [name] [name] 08/01/2021, 6:43 PM

## 2021-08-01 NOTE — Progress Notes (Signed)
ANTICOAGULATION CONSULT NOTE - Initial Consult  Pharmacy Consult for apixaban Indication: atrial fibrillation  Allergies  Allergen Reactions   Allopurinol Other (See Comments)    Felt terrible   Aripiprazole Other (See Comments)    Felt terrible   Bupropion Other (See Comments)    Felt bad   Buspirone Other (See Comments)    Felt bad   Desvenlafaxine Other (See Comments)    Made him hyper   Duloxetine Other (See Comments)    Knocked him out   Escitalopram Other (See Comments)    Felt like crap   Fish Oil     unknown   Fluticasone Furoate Other (See Comments)    Unknown    Hydroxyzine Hcl Other (See Comments)    Knocked him out   Nortriptyline Other (See Comments)    Ineffective   Paroxetine Hcl Other (See Comments)    unknown   Ramipril Cough   Sertraline Other (See Comments)    "felt plugged in"   Venlafaxine Other (See Comments)    Felt bad   Vilazodone Other (See Comments)    "felt weird"    Patient Measurements: Height: '6\' 1"'$  (185.4 cm) Weight: 92.2 kg (203 lb 4.2 oz) IBW/kg (Calculated) : 79.9  Vital Signs: Temp: 98.1 F (36.7 C) (09/12 0540) Temp Source: Oral (09/12 0540) BP: 106/62 (09/12 0540) Pulse Rate: 62 (09/12 0540)  Labs: Recent Labs    07/30/21 0919 07/31/21 0607 08/01/21 0909  HGB  --  8.0* 8.4*  HCT  --  25.3* 26.9*  PLT  --  161 143*  CREATININE 1.93* 1.88* 1.87*    Estimated Creatinine Clearance: 42.7 mL/min (A) (by C-G formula based on SCr of 1.87 mg/dL (H)).   Medical History: Past Medical History:  Diagnosis Date   Acute on chronic congestive heart failure (HCC)    Acute on chronic systolic CHF (congestive heart failure) ([place]) 05/04/2021   Arthritis of left knee 12/02/2020   Bilateral leg edema 05/04/2017   CAD (coronary artery disease)    nonST elevated MI in Oct 2008 treated w/2 drug -eluting stents to the RCA and  mid LAD, EF 55%   CAD (coronary artery disease), native coronary artery 11/12/2018   Formatting of this  note might be different from the original. S/p MI - Dr. 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 Hypertension  10/1 IMO update   Gout, unspecified 10/14/2007   Overview:  Gout  10/1 IMO update  Formatting of this note might be different from the original. Gout  10/1 IMO update   History of colonic polyps 11/12/2018   Overview:  rpt colonoscopy 5/17; Dr. Haig Prophet of this note might be different from the original. rpt colonoscopy 5/17; Dr. Erlene Quan   History of kidney stones    History of nephrolithiasis 11/12/2018   History of smoking 11/12/2018   Overview:  quit 2000  Formatting of this note might be different from the original. quit 2000   HTN (hypertension)    Hyperlipidemia    HYPERTENSION, BENIGN 06/15/2009   Inguinal  hernia without mention of obstruction or gangrene, recurrent unilateral or unspecified 11/12/2018   Ischemic cardiomyopathy 08/12/2020   Medication intolerance 11/12/2018   Mixed hyperlipidemia 11/12/2018   Old myocardial infarction 11/12/2018   Paroxysmal atrial fibrillation ([place]) 07/16/2020   Pre-operative cardiovascular examination 11/12/2018   Primary osteoarthritis of both knees 03/05/2017   Considering Zilretta injections. Good improvement with corticosteroid injections however short-lived. Mild improvement with Visco supplementation.   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 Diabetes Mellitus   Unilateral primary osteoarthritis, right knee 11/15/2018    Medications: Pt on apixaban PTA for atrial fibrillation  Assessment: Pt is a 70 year old male admitted with abdominal pain, diarrhea. PMH significant for atrial fibrillation for which he is prescribed apixaban PTA.  Patient was diagnosed with C.diff on 9/3. On 9/4, pt had GIB requiring anticoagulation reversal with KCentra and bedside EGD with EndoClip placement for post-biliary sphincterotomy bleeding. Anticoagulation has been on hold since that time.   Pharmacy consulted to resume apixaban today.   Today, 08/01/21 CBC: Hgb (8.4) low but stable for several days; Plt slightly low SCr 1.87, CrCl ~42 mL/min TBW = 92 kg  Plan:  Resume apixaban 5 mg PO BID for atrial fibrillation Monitor for signs of bleeding Check CBC with AM labs tomorrow  Lenis Noon, PharmD 08/01/2021,1:36 PM

## 2021-08-01 NOTE — Care Management Important Message (Signed)
Important Message  Patient Details IM Letter given to the Patient. Name: [name] [name] MRN: [id number] Date of Birth: 1951-03-03   Medicare Important Message Given:  Yes     Kerin Salen 08/01/2021, 11:41 AM

## 2021-08-02 DIAGNOSIS — Z515 Encounter for palliative care: Secondary | ICD-10-CM | POA: Diagnosis not present

## 2021-08-02 DIAGNOSIS — Z7189 Other specified counseling: Secondary | ICD-10-CM | POA: Diagnosis not present

## 2021-08-02 DIAGNOSIS — K810 Acute cholecystitis: Secondary | ICD-10-CM | POA: Diagnosis not present

## 2021-08-02 DIAGNOSIS — R531 Weakness: Secondary | ICD-10-CM

## 2021-08-02 LAB — CBC
HCT: 26.7 % — ABNORMAL LOW (ref 39.0–52.0)
Hemoglobin: 8.4 g/dL — ABNORMAL LOW (ref 13.0–17.0)
MCH: 26.2 pg (ref 26.0–34.0)
MCHC: 31.5 g/dL (ref 30.0–36.0)
MCV: 83.2 fL (ref 80.0–100.0)
Platelets: 143 10*3/uL — ABNORMAL LOW (ref 150–400)
RBC: 3.21 MIL/uL — ABNORMAL LOW (ref 4.22–5.81)
RDW: 21.5 % — ABNORMAL HIGH (ref 11.5–15.5)
WBC: 8.6 10*3/uL (ref 4.0–10.5)
nRBC: 0 % (ref 0.0–0.2)

## 2021-08-02 LAB — GLUCOSE, CAPILLARY
Glucose-Capillary: 130 mg/dL — ABNORMAL HIGH (ref 70–99)
Glucose-Capillary: 134 mg/dL — ABNORMAL HIGH (ref 70–99)
Glucose-Capillary: 158 mg/dL — ABNORMAL HIGH (ref 70–99)
Glucose-Capillary: 193 mg/dL — ABNORMAL HIGH (ref 70–99)

## 2021-08-02 MED ORDER — MELATONIN 3 MG PO TABS
3.0000 mg | ORAL_TABLET | Freq: Every day | ORAL | Status: DC
  Administered 2021-08-02 – 2021-08-09 (×4): 3 mg via ORAL
  Filled 2021-08-02 (×7): qty 1

## 2021-08-02 NOTE — Progress Notes (Signed)
PROGRESS NOTE    [name] [name]  [id number]:[id number] DOB: 10-Oct-1951 DOA: 07/17/2021 PCP: Raeanne Gathers, MD    Chief Complaint  Patient presents with   Abdominal Pain    Brief Narrative:  70 year old gentleman prior history of hypertension, diabetes, chronic systolic heart failure, stage IIIa CKD, paroxysmal atrial fibrillation, coronary artery disease presents with abdominal pain and diarrhea patient was found to have acute cholecystitis with choledocholithiasis and mild pancreatitis.  He was started on broad-spectrum antibiotics and general surgery, GI consulted.  In view of increased surgical risk patient did not undergo a cholecystectomy.  He was treated conservatively with antibiotics.  Hospital course complicated by diarrhea which was found to be C. difficile diarrhea, GI bleed probably secondary to Eliquis.  Patient received Kcentra for Eliquis reversal, GI consulted underwent EGD found to have a bleeding vessel at the sphincterotomy site which was endoclipped on 07/24/2021.  He was transferred to ICU for hemorrhagic shock briefly requiring Levophed.  He is currently off all pressors. Over the last one week, we have watched his hemoglobin slowly drop from 9.6 to 8.4,. Eliquis was started on 08/01/21, plan to watch him for atleast 48 hours after starting eliquis for recurrent bleeding and if hemoglobin remains stable, he can discharged to SNF.  If he starts bleeding , need to call in Dr Benson Norway for repeat EGD.  Palliative care consulted, changed to DNR and plan for outpatient follow up with palliative at SNF on discharge.   Assessment & Plan:   Principal Problem:   Acute cholecystitis Active Problems:   Essential hypertension   Paroxysmal atrial fibrillation (HCC)   Choledocholithiasis   Pancreatitis   Prolonged QT interval   Diabetes mellitus type 2, uncomplicated (HCC)   CKD (chronic kidney disease) stage 3, GFR 30-59 ml/min (HCC)   Thrombocytopenia (HCC)   Leukocytosis   Chronic  systolic CHF (congestive heart failure) (HCC)   Hyperbilirubinemia   Acute cholecystitis/choledocholithiasis/pain biliary pancreatitis S/p ERCP showing choledocholithiasis underwent stone removal with sphincterotomy.  Cholecystectomy was scheduled however it was canceled as cardiology could not clear him for surgery.  General surgery has signed off and recommended outpatient follow-up in the office. Patient has received 3 days of IV Zosyn which was discontinued followed by oral Augmentin for 2 more days. Pt reports occasional intermittent abd pain, resolves spontaneously. Repeat labs show elevated lipase, elevated alk phos and mild elevation in bilirubin. It was followed with a  repeat CT abdomen and pelvis showing  Extensive pneumobilia is noted in the left hepatic ducts as well as in the common bile and cystic ducts. Large amount of air is also noted in the gallbladder lumen. This most likely is due to recent instrumentation. Moderate gallbladder wall thickening is noted without cholelithiasis concerning for cholecystitis. Today denies any nausea, vomiting or abdominal pain.  His main complaint is lack of appetite and lack of energy.     Mild acute on chronic combined systolic and diastolic heart failure Echocardiogram showed left ventricular ejection fraction of 20 to 25% with severe right ventricular dysfunction Cardiology on board and has ordered a PYP scan as outpatient to evaluate for amyloid, SPEP, UPEP and light chains have been ordered. Recommend outpatient follow up.  He was initially started on IV Lasix for fluid overload but has been discontinued due to worsening renal parameters,.  Pt remains off lasix for now.  Outpatient follow up with cardiology.     Mild thrombocytopenia. Much improved.    Lower GI bleed in the setting of  C. difficile colitis Patient was on Eliquis for anticoagulation for atrial fibrillation which was reversed with Kcentra.  GI consulted patient  underwent EGD was found to have a bleeding all vessel at the sphincterotomy site underwent Endo clipping of the bleeding vessel.  Patient was transferred to ICU for hemorrhagic shock and was on Levophed briefly.  Patient currently off all pressors and hemoglobin stable between 8 and 9. No bleeding seen.  Hemoglobin dropped from 9.6 post transfusions to 8.4 and remained stable. Restarted Eliquis on 08/01/21 after discussing with Dr Benson Norway, plan to watch him for a couple of days for recurrent bleeding and if no bleeding and hemoglobin is stable tomorrow, he can be discharged to SNF.    C. difficile diarrhea/ COLITIS.  Resolved. Completed the course of oral vancomycin.     Type 2 diabetes mellitus Well controlled cbg's . Continue with sliding scale insulin at this time. CBG (last 3)  Recent Labs    08/02/21 0021 08/02/21 0620 08/02/21 1216  GLUCAP 158* 134* 130*   Hemoglobin A1c is 6.6%. No changes in meds.     Mild acute on stage IIIb CKD Baseline creatinine between 1.5 to 1.8, initially creatinine worsened to 2.65 with IV diuresis. Currently he is  not on any diuretics or nephrotoxins.  Continue to monitor. No changes in meds.     Paroxysmal atrial fibrillation Rate controlled and started the patient on eliquis.     Coronary artery disease s/p NSTEMI in 2008 s/p stent to RCA and LAD Continue with statin at this time   Mild acute encephalopathy: Probably sec to acute illness. Pt appears to be at baseline.  MRI brain is negative for acute stroke.   Failure to thrive, poor oral intake :  Therapy evaluations are recommending SNF, TOC on board.     In view of multiple medical problems, deconditioning and debility requested palliative care for goals of care discussion. Patient wanted DNR and will recommend outpatient follow up on discharge for further discussions.      DVT prophylaxis: SCDs Code Status: Full code Family Communication: discussed with son on  08/01/21 Disposition:   Status is: Inpatient  Remains inpatient appropriate because:Ongoing diagnostic testing needed not appropriate for outpatient work up, Unsafe d/c plan, and IV treatments appropriate due to intensity of illness or inability to take PO  Dispo: The patient is from: Home              Anticipated d/c is to: SNF              Patient currently is not medically stable to d/c.   Difficult to place patient No       Consultants:  Critical care PCCM  Cardiology Gastroenterology Procedures: .none.  Antimicrobials:  Antibiotics Given (last 72 hours)     Date/Time Action Medication Dose   07/30/21 1516 Given   vancomycin (VANCOCIN) capsule 125 mg 125 mg   07/30/21 1838 Given   vancomycin (VANCOCIN) capsule 125 mg 125 mg   07/30/21 2116 Given   vancomycin (VANCOCIN) capsule 125 mg 125 mg   07/31/21 1248 Given   vancomycin (VANCOCIN) capsule 125 mg 125 mg   07/31/21 1541 Given   vancomycin (VANCOCIN) capsule 125 mg 125 mg   07/31/21 1904 Given   vancomycin (VANCOCIN) capsule 125 mg 125 mg   07/31/21 2153 Given   vancomycin (VANCOCIN) capsule 125 mg 125 mg   08/01/21 0848 Given   vancomycin (VANCOCIN) capsule 125 mg 125 mg   08/01/21  1413 Given   vancomycin (VANCOCIN) capsule 125 mg 125 mg   08/01/21 1817 Given   vancomycin (VANCOCIN) capsule 125 mg 125 mg   08/01/21 2113 Given   vancomycin (VANCOCIN) capsule 125 mg 125 mg   08/02/21 1042 Given   vancomycin (VANCOCIN) capsule 125 mg 125 mg         Subjective: No nausea, vomiting or abd pain. No appetite. Does not feel like working with PT.   Objective: Vitals:   08/01/21 1725 08/01/21 2042 08/02/21 0624 08/02/21 0730  BP: 98/64 102/64 (!) 100/58   Pulse: 65 68 62   Resp: _0 Temp: (!) 97.5 F (36.4 C) (!) 97.5 F (36.4 C) (!) 97.4 F (36.3 C)   TempSrc: Oral Oral Oral   SpO2: 100% 94% 98%   Weight:    94.2 kg  Height:        Intake/Output Summary (Last 24 hours) at 08/02/2021  1424 Last data filed at 08/02/2021 1000 Gross per 24 hour  Intake 530 ml  Output 600 ml  Net -70 ml    Filed Weights   07/29/21 0512 08/01/21 0530 08/02/21 0730  Weight: 93 kg 92.2 kg 94.2 kg    Examination:  General exam: Appears calm and comfortable  Respiratory system: Clear to auscultation. Respiratory effort normal. Cardiovascular system: S1 & S2 heard, RRR. No JVD, No pedal edema. Gastrointestinal system: Abdomen is nondistended, soft and nontender.Normal bowel sounds heard. Central nervous system: Alert and oriented. No focal neurological deficits. Extremities: Symmetric 5 x 5 power. Skin: No rashes, lesions or ulcers Psychiatry: flat affect.         Data Reviewed: I have personally reviewed following labs and imaging studies  CBC: Recent Labs  Lab 07/28/21 0516 07/28/21 1223 07/29/21 0538 07/31/21 0607 08/01/21 0909 08/02/21 0444  WBC 8.9  --  9.9 8.0 8.8 8.6  NEUTROABS  --   --   --   --  7.2  --   HGB 8.8* 9.1* 8.5* 8.0* 8.4* 8.4*  HCT 28.6* 29.8* 27.0* 25.3* 26.9* 26.7*  MCV 84.6  --  83.6 83.5 84.3 83.2  PLT 144*  --  149* 161 143* 143*     Basic Metabolic Panel: Recent Labs  Lab 07/28/21 0516 07/29/21 0538 07/30/21 0919 07/31/21 0607 08/01/21 0909  NA 142 137 136 135 130*  K 4.3 4.3 4.4 4.3 3.8  CL 106 98 102 102 97*  CO2 32 _1 GLUCOSE 135* 116* 152* 160* 203*  BUN 37* 35* 33* 30* 26*  CREATININE 1.69* 1.77* 1.93* 1.88* 1.87*  CALCIUM 8.7* 8.4* 8.6* 8.5* 8.1*     GFR: Estimated Creatinine Clearance: 42.7 mL/min (A) (by C-G formula based on SCr of 1.87 mg/dL (H)).  Liver Function Tests: Recent Labs  Lab 07/28/21 1223 07/29/21 0538 07/30/21 0919 08/01/21 0909  AST 39 45* 53* 123*  ALT _2 ALKPHOS 316* 302* 285* 241*  BILITOT 2.0* 2.1* 1.7* 1.7*  PROT 5.6* 5.3* 5.7* 5.5*  ALBUMIN 2.4* 2.4* 2.5* 2.4*     CBG: Recent Labs  Lab 08/01/21 1240 08/01/21 1812 08/02/21 0021 08/02/21 0620 08/02/21 1216   GLUCAP 216* 189* 158* 134* 130*      Recent Results (from the past 240 hour(s))  MRSA Next Gen by PCR, Nasal     Status: None   Collection Time: 07/24/21  3:27 PM   Specimen: Nasal Mucosa; Nasal Swab  Result Value Ref Range Status  MRSA by PCR Next Gen NOT DETECTED NOT DETECTED Final    Comment: (NOTE) The GeneXpert MRSA Assay (FDA approved for NASAL specimens only), is one component of a comprehensive MRSA colonization surveillance program. It is not intended to diagnose MRSA infection nor to guide or monitor treatment for MRSA infections. Test performance is not FDA approved in patients less than 51 years old. Performed at [facility], [place] [address] [place], [place] [address]   Resp Panel by RT-PCR (Flu A&B, Covid) Nasopharyngeal Swab     Status: None   Collection Time: 08/01/21  9:52 AM   Specimen: Nasopharyngeal Swab; Nasopharyngeal(NP) swabs in vial transport medium  Result Value Ref Range Status   SARS Coronavirus 2 by RT PCR NEGATIVE NEGATIVE Final    Comment: (NOTE) SARS-CoV-2 target nucleic acids are NOT DETECTED.  The SARS-CoV-2 RNA is generally detectable in upper respiratory specimens during the acute phase of infection. The lowest concentration of SARS-CoV-2 viral copies this assay can detect is 138 copies/mL. A negative result does not preclude SARS-Cov-2 infection and should not be used as the sole basis for treatment or other patient management decisions. A negative result may occur with  improper specimen collection/handling, submission of specimen other than nasopharyngeal swab, presence of viral mutation(s) within the areas targeted by this assay, and inadequate number of viral copies(<138 copies/mL). A negative result must be combined with clinical observations, patient history, and epidemiological information. The expected result is Negative.  Fact Sheet for Patients:  EntrepreneurPulse.com.au  Fact Sheet for  Healthcare Providers:  IncredibleEmployment.be  This test is no t yet approved or cleared by the [place] FDA and  has been authorized for detection and/or diagnosis of SARS-CoV-2 by FDA under an Emergency Use Authorization (EUA). This EUA will remain  in effect (meaning this test can be used) for the duration of the COVID-19 declaration under Section 564(b)(1) of the Act, 21 U.S.C.section 360bbb-3(b)(1), unless the authorization is terminated  or revoked sooner.       Influenza A by PCR NEGATIVE NEGATIVE Final   Influenza B by PCR NEGATIVE NEGATIVE Final    Comment: (NOTE) The Xpert Xpress SARS-CoV-2/FLU/RSV plus assay is intended as an aid in the diagnosis of influenza from Nasopharyngeal swab specimens and should not be used as a sole basis for treatment. Nasal washings and aspirates are unacceptable for Xpert Xpress SARS-CoV-2/FLU/RSV testing.  Fact Sheet for Patients: EntrepreneurPulse.com.au  Fact Sheet for Healthcare Providers: IncredibleEmployment.be  This test is not yet approved or cleared by the [place] FDA and has been authorized for detection and/or diagnosis of SARS-CoV-2 by FDA under an Emergency Use Authorization (EUA). This EUA will remain in effect (meaning this test can be used) for the duration of the COVID-19 declaration under Section 564(b)(1) of the Act, 21 U.S.C. section 360bbb-3(b)(1), unless the authorization is terminated or revoked.  Performed at [facility], [place] [address], [place], [place] [address]           Radiology Studies: No results found.      Scheduled Meds:  apixaban  5 mg Oral BID   Chlorhexidine Gluconate Cloth  6 each Topical Daily   insulin aspart  0-9 Units Subcutaneous TID WC   mouth rinse  15 mL Mouth Rinse BID   pantoprazole  40 mg Oral BID   rosuvastatin  40 mg Oral QHS   Continuous Infusions:  sodium chloride     lactated  ringers       LOS:  15 days        Hosie Poisson, MD [facility]   To contact the attending provider between 7A-7P or the covering provider during after hours 7P-7A, please log into the web site [email or web address] and access using universal [place] password for that web site. If you do not have the password, please call the hospital operator.  08/02/2021, 2:24 PM

## 2021-08-02 NOTE — Progress Notes (Signed)
PT Cancellation Note  Patient Details Name: [name] [name] MRN: [id number] DOB: [birth year]   Cancelled Treatment:    Reason Eval/Treat Not Completed: Patient declined, no reason specified. Pt declines PT session today, reports "processing a lot of difficult conversations". Offered encouragement and education on therapeutic process, pt thankful and reports he is hopeful to mobilize tomorrow. Will continue PT efforts.    Tori Chantea [place] PT, DPT 08/02/21, 11:09 AM

## 2021-08-02 NOTE — Progress Notes (Signed)
Daily Progress Note   Patient Name: [name] [name]       Date: 08/02/2021 DOB: [birth year]  Age: 70 y.o. MRN#: [id number] Attending Physician: Hosie Poisson, MD Primary Care Physician: Raeanne Gathers, MD Admit Date: 07/17/2021  Reason for Consultation/Follow-up: Establishing goals of care  Subjective:  Awake alert resting in bed, lunch tray is untouched. Patient states that he ate yesterday, just doesn't feel hungry so far. Denies pain in abdomen. He states that he did not feel upto participating with PT earlier today, he might want to try later on today. He lives by himself in [place], his son lives locally. He feels overwhelmed with the information he has been given regarding his current conditions.   We discussed about his current hospitalization as well as broad goals of care, see below.   Length of Stay: 15  Current Medications: Scheduled Meds:  . apixaban  5 mg Oral BID  . Chlorhexidine Gluconate Cloth  6 each Topical Daily  . insulin aspart  0-9 Units Subcutaneous TID WC  . mouth rinse  15 mL Mouth Rinse BID  . pantoprazole  40 mg Oral BID  . rosuvastatin  40 mg Oral QHS    Continuous Infusions: . sodium chloride    . lactated ringers      PRN Meds: acetaminophen **OR** acetaminophen, alum & mag hydroxide-simeth, naphazoline-glycerin, ondansetron (ZOFRAN) IV, traMADol, Zinc Oxide  Physical Exam         Awake alert Somewhat of a flat affect Regular work of breathing S 1 S 2  Abdomen not distended No focal deficits.   Vital Signs: BP (!) 100/58 (BP Location: Left Arm)   Pulse 62   Temp (!) 97.4 F (36.3 C) (Oral)   Resp 16   Ht '6\' 1"'$  (1.854 m)   Wt 94.2 kg   SpO2 98%   BMI 27.40 kg/m  SpO2: SpO2: 98 % O2 Device: O2 Device: Room  Air O2 Flow Rate: O2 Flow Rate (L/min): 4 L/min  Intake/output summary:  Intake/Output Summary (Last 24 hours) at 08/02/2021 1428 Last data filed at 08/02/2021 1000 Gross per 24 hour  Intake 530 ml  Output 600 ml  Net -70 ml   LBM: Last BM Date: 08/01/21 Baseline Weight: Weight: 101 kg Most recent weight: Weight: 94.2 kg  PPS 40% Palliative Assessment/Data:    Flowsheet Rows    Flowsheet Row Most Recent Value  Intake Tab   Referral Department Hospitalist  Unit at Time of Referral Med/Surg Unit  Palliative Care Primary Diagnosis Cardiac  Date Notified 07/27/21  Palliative Care Type New Palliative care  Reason for referral Clarify Goals of Care  Date of Admission 07/17/21  Date first seen by Palliative Care 07/29/21  # of days Palliative referral response time 2 Day(s)  # of days IP prior to Palliative referral 10  Clinical Assessment   Palliative Performance Scale Score 50%  Psychosocial & Spiritual Assessment   Palliative Care Outcomes   Patient/Family meeting held? Yes  Who was at the meeting? Patient  Palliative Care Outcomes Clarified goals of care       Patient Active Problem List   Diagnosis Date Noted  . Acute cholecystitis 07/18/2021  . Choledocholithiasis 07/18/2021  . Pancreatitis 07/18/2021  . Prolonged QT interval 07/18/2021  . Diabetes mellitus type 2, uncomplicated ([place]) 99991111  . CKD (chronic kidney disease) stage 3, GFR 30-59 ml/min (HCC) 07/18/2021  . Thrombocytopenia ([place]) 07/18/2021  . Leukocytosis 07/18/2021  . Chronic systolic CHF (congestive heart failure) ([place]) 07/18/2021  . Hyperbilirubinemia 07/18/2021  . Acute on chronic congestive heart failure ([place])   . Acute on chronic systolic CHF (congestive heart failure) ([place]) 05/04/2021  . History of kidney stones   . 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 Arthritis of left knee 12/02/2020  . Renal insufficiency 08/12/2020  . Ischemic cardiomyopathy 08/12/2020  . CAD  (coronary artery disease)   . DM (diabetes mellitus) ([place])   . HTN (hypertension)   . Hyperlipidemia   . Diabetes mellitus due to underlying condition with unspecified complications ([place]) 0000000  . Paroxysmal atrial fibrillation ([place]) 07/16/2020  . Erythrocytosis 06/01/2020  . Unilateral primary osteoarthritis, right knee 11/15/2018  . 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 Depressive disorder 11/12/2018  . 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 History of colonic polyps 11/12/2018  . History of nephrolithiasis 11/12/2018  . History of smoking 11/12/2018  . 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 Medication intolerance 11/12/2018  . Old myocardial infarction 11/12/2018  . Mixed hyperlipidemia 11/12/2018  . Pre-operative cardiovascular examination 11/12/2018  . CAD (coronary artery disease), native coronary artery 11/12/2018  . Bilateral leg edema 05/04/2017  . Primary osteoarthritis of both knees 03/05/2017  . CAP (community acquired pneumonia) 08/17/2016  . Right carotid bruit 08/05/2014  . HYPERTENSION, BENIGN 06/15/2009  . CAD, NATIVE VESSEL 06/15/2009  . Type II or unspecified type diabetes mellitus without mention of complication, not stated as uncontrolled 10/14/2007  . Gout, unspecified 10/14/2007  . Essential hypertension 10/14/2007    Palliative Care Assessment & Plan   Patient Profile:    Assessment:  70 y.o. male  with past medical history of hypertension, diabetes, chronic systolic heart failure, stage III CKD, paroxysmal A. fib, CAD admitted on 07/17/2021 with acute cholecystitis and mild pancreatitis.  He was started on broad-spectrum antibiotics and general surgery and GI were consulted.  He was being evaluated for cholecystectomy but due to increased surgical risk, decision was made to proceed with conservative treatment with antibiotics.  He had a  complicated course with diarrhea found to be C. difficile and GI bleed (potentially possibly second Eliquis) he had a EGD revealed bleeding vessel at the sphincterotomy site and Endo Clip was placed on  9/4.  He had a short stay in the ICU.  Echo reveals concern for amyloidosis.  Palliative consulted for goals of care.   Recommendations/Plan:  Agree with DNR.  Continue current mode of care Recommend SNF rehab with palliative care following on discharge. Offered active listening, supportive presence and empathic communication in encounter today, recommend outpatient palliative so as to continue trust building and provision of goal concordant care.   Goals of Care and Additional Recommendations: Limitations on Scope of Treatment: Full Scope Treatment  Code Status:    Code Status Orders  (From admission, onward)           Start     Ordered   08/02/21 1044  Do not attempt resuscitation (DNR)  Continuous       Question Answer Comment  In the event of cardiac or respiratory ARREST Do not call a "code blue"   In the event of cardiac or respiratory ARREST Do not perform Intubation, CPR, defibrillation or ACLS   In the event of cardiac or respiratory ARREST Use medication by any route, position, wound care, and other measures to relive pain and suffering. May use oxygen, suction and manual treatment of airway obstruction as needed for comfort.      08/02/21 1043           Code Status History     Date Active Date Inactive Code Status Order ID Comments User Context   07/18/2021 0142 08/02/2021 1043 Full Code [id number]  Chotiner, Yevonne Aline, MD Inpatient   05/04/2021 0247 05/06/2021 2021 Full Code [id number]  S[place]ney Ace Arvella Merles, MD ED   12/03/2020 1421 12/04/2020 2102 Full Code [id number]  Mcarthur Rossetti, MD Inpatient   11/15/2018 1804 11/19/2018 1537 Full Code [id number]  Mcarthur Rossetti, MD Inpatient   08/18/2016 0224 08/20/2016 1652 Full Code [id number]  Jani Gravel, MD Inpatient        Prognosis:  Unable to determine  Discharge Planning: [facility] for rehab with Palliative care service follow-up  Care plan was discussed with  patient and [facility] hospitalist MD.   Thank you for allowing the Palliative Medicine Team to assist in the care of this patient.   Time In: 1300 Time Out: 1325 Total Time 25 Prolonged Time Billed  no       Greater than 50%  of this time was spent counseling and coordinating care related to the above assessment and plan.  Loistine Chance, MD  Please contact Palliative Medicine Team phone at [phone] for questions and concerns from 7 AM to 7 PM. After 7 PM, please contact the primary service.

## 2021-08-03 ENCOUNTER — Inpatient Hospital Stay (HOSPITAL_COMMUNITY): Payer: Medicare Other

## 2021-08-03 DIAGNOSIS — K810 Acute cholecystitis: Secondary | ICD-10-CM | POA: Diagnosis not present

## 2021-08-03 DIAGNOSIS — R0603 Acute respiratory distress: Secondary | ICD-10-CM | POA: Diagnosis not present

## 2021-08-03 DIAGNOSIS — K8080 Other cholelithiasis without obstruction: Secondary | ICD-10-CM | POA: Diagnosis not present

## 2021-08-03 LAB — COMPREHENSIVE METABOLIC PANEL
ALT: 64 U/L — ABNORMAL HIGH (ref 0–44)
AST: 289 U/L — ABNORMAL HIGH (ref 15–41)
Albumin: 2.3 g/dL — ABNORMAL LOW (ref 3.5–5.0)
Alkaline Phosphatase: 231 U/L — ABNORMAL HIGH (ref 38–126)
Anion gap: 7 (ref 5–15)
BUN: 25 mg/dL — ABNORMAL HIGH (ref 8–23)
CO2: 25 mmol/L (ref 22–32)
Calcium: 8.4 mg/dL — ABNORMAL LOW (ref 8.9–10.3)
Chloride: 104 mmol/L (ref 98–111)
Creatinine, Ser: 1.72 mg/dL — ABNORMAL HIGH (ref 0.61–1.24)
GFR, Estimated: 43 mL/min — ABNORMAL LOW (ref >60)
Glucose, Bld: 177 mg/dL — ABNORMAL HIGH (ref 70–99)
Potassium: 4 mmol/L (ref 3.5–5.1)
Sodium: 136 mmol/L (ref 135–145)
Total Bilirubin: 1.5 mg/dL — ABNORMAL HIGH (ref 0.3–1.2)
Total Protein: 5.1 g/dL — ABNORMAL LOW (ref 6.5–8.1)

## 2021-08-03 LAB — CBC
HCT: 26.9 % — ABNORMAL LOW (ref 39.0–52.0)
Hemoglobin: 8.6 g/dL — ABNORMAL LOW (ref 13.0–17.0)
MCH: 25.9 pg — ABNORMAL LOW (ref 26.0–34.0)
MCHC: 32 g/dL (ref 30.0–36.0)
MCV: 81 fL (ref 80.0–100.0)
Platelets: 143 10*3/uL — ABNORMAL LOW (ref 150–400)
RBC: 3.32 MIL/uL — ABNORMAL LOW (ref 4.22–5.81)
RDW: 21.2 % — ABNORMAL HIGH (ref 11.5–15.5)
WBC: 8.8 10*3/uL (ref 4.0–10.5)
nRBC: 0 % (ref 0.0–0.2)

## 2021-08-03 LAB — GLUCOSE, CAPILLARY
Glucose-Capillary: 135 mg/dL — ABNORMAL HIGH (ref 70–99)
Glucose-Capillary: 163 mg/dL — ABNORMAL HIGH (ref 70–99)
Glucose-Capillary: 178 mg/dL — ABNORMAL HIGH (ref 70–99)
Glucose-Capillary: 186 mg/dL — ABNORMAL HIGH (ref 70–99)
Glucose-Capillary: 191 mg/dL — ABNORMAL HIGH (ref 70–99)
Glucose-Capillary: 191 mg/dL — ABNORMAL HIGH (ref 70–99)

## 2021-08-03 MED ORDER — GADOBUTROL 1 MMOL/ML IV SOLN
10.0000 mL | Freq: Once | INTRAVENOUS | Status: AC | PRN
  Administered 2021-08-03: 10 mL via INTRAVENOUS

## 2021-08-03 MED ORDER — ALPRAZOLAM 0.25 MG PO TABS: 0.2500 mg | ORAL_TABLET | Freq: Every evening | ORAL | 0 refills | Status: AC | PRN

## 2021-08-03 MED ORDER — TRAMADOL HCL 50 MG PO TABS: 25.0000 mg | ORAL_TABLET | ORAL | 0 refills | Status: AC | PRN

## 2021-08-03 NOTE — Progress Notes (Signed)
Occupational Therapy Treatment Patient Details Name: [name] [name] MRN: [id number] DOB: 1951/06/27 Today's Date: 08/03/2021   History of present illness 70 year old male with history of diabetes mellitus type 2, chronic systolic CHF, CKD stage III, paroxysmal atrial fibrillation, CAD, hypertension, osteoarthritis, patient presented with abdominal pain and diarrhea.  On presentation was found to have acute cholecystitis with choledocholithiasis and mild pancreatitis along with leukocytosis elevated LFTs., + C-Diff.   OT comments  Patient reported feeling fatigued with patient agreeing to try. Patient sat up on edge of bed with mod A for supine to sit on edge of bed. Patient required min A for rolling in bed with education on proper hand placement. Patient's discharge plan remains appropriate at this time. OT will continue to follow acutely.     Recommendations for follow up therapy are one component of a multi-disciplinary discharge planning process, led by the attending physician.  Recommendations may be updated based on patient status, additional functional criteria and insurance authorization.    Follow Up Recommendations  SNF    Equipment Recommendations  Tub/shower seat    Recommendations for Other Services      Precautions / Restrictions Precautions Precautions: Fall Precaution Comments: c diff, frequent stool, orthostatic 08/01/2021 Restrictions Weight Bearing Restrictions: No       Mobility Bed Mobility Overal bed mobility: Needs Assistance Bed Mobility: Supine to Sit;Sit to Supine Rolling: Min assist   Supine to sit: HOB elevated;Mod assist Sit to supine: Min guard;HOB elevated   General bed mobility comments: Min to Mod A for upper body and to assist to scoot bottom towards edge of bed so feet could be flat on the floor    Transfers                      Balance Overall balance assessment: Needs assistance Sitting-balance support: Feet  supported Sitting balance-Leahy Scale: Fair Sitting balance - Comments: patient was able to maintain sitting balance on edge of bed with BUE support./                                   ADL either performed or assessed with clinical judgement   ADL                                               Vision       Perception     Praxis      Cognition Arousal/Alertness: Awake/alert Behavior During Therapy: Flat affect Overall Cognitive Status: No family/caregiver present to determine baseline cognitive functioning                                          Exercises     Shoulder Instructions       General Comments      Pertinent Vitals/ Pain       Pain Assessment: Faces Faces Pain Scale: No hurt  Home Living                                          Prior Functioning/Environment  Frequency  Min 2X/week        Progress Toward Goals  OT Goals(current goals can now be found in the care plan section)  Progress towards OT goals: Progressing toward goals  Acute Rehab OT Goals Patient Stated Goal: I want to get stronger and get out of this bed.  Plan Discharge plan remains appropriate    Co-evaluation                 AM-PAC OT "6 Clicks" Daily Activity     Outcome Measure   Help from another person eating meals?: None Help from another person taking care of personal grooming?: A Little Help from another person toileting, which includes using toliet, bedpan, or urinal?: A Lot Help from another person bathing (including washing, rinsing, drying)?: A Lot Help from another person to put on and taking off regular upper body clothing?: A Little Help from another person to put on and taking off regular lower body clothing?: A Lot 6 Click Score: 16    End of Session    OT Visit Diagnosis: Unsteadiness on feet (R26.81);Other abnormalities of gait and mobility (R26.89)    Activity Tolerance Patient tolerated treatment well   Patient Left in bed;with call bell/phone within reach;with bed alarm set   Nurse Communication Other (comment) (nurse cleared patient to participate)        Time: [id number] OT Time Calculation (min): 16 min  Charges: OT General Charges $OT Visit: 1 Visit OT Treatments $Therapeutic Activity: 8-22 mins  [name] [name] OTR/L, [place] [facility] Department Office# [phone] Pager# [phone]   [place] 08/03/2021, 4:15 PM

## 2021-08-03 NOTE — Progress Notes (Signed)
PROGRESS NOTE  NOTNAMED SWOVELAND [id number] DOB: April 25, 1951 DOA: 07/17/2021 PCP: Raeanne Gathers, MD   LOS: 16 days   Brief Narrative / Interim history: 70 year old gentleman prior history of hypertension, diabetes, chronic systolic heart failure, stage IIIa CKD, paroxysmal atrial fibrillation, coronary artery disease presents with abdominal pain and diarrhea patient was found to have acute cholecystitis with choledocholithiasis and mild pancreatitis.  He was started on broad-spectrum antibiotics and general surgery, GI consulted.  In view of increased surgical risk patient did not undergo a cholecystectomy.  He was treated conservatively with antibiotics.  Hospital course complicated by diarrhea which was found to be C. difficile diarrhea, GI bleed probably secondary to Eliquis.  Patient received Kcentra for Eliquis reversal, GI consulted underwent EGD found to have a bleeding vessel at the sphincterotomy site which was endoclipped on 07/24/2021.  He was transferred to ICU for hemorrhagic shock briefly requiring Levophed.  He is currently off all pressors. Over the last one week, we have watched his hemoglobin slowly drop from 9.6 to 8.4,. Eliquis was started on 08/01/21  Subjective / 24h Interval events: Doing well, no abdominal pain, no nausea or vomiting.  Eating without difficulties  Assessment & Plan: Principal Problem Acute cholecystitis/choledocholithiasis/pain biliary pancreatitis -gastroenterology and general surgery consulted on admission.  S/p ERCP showing choledocholithiasis underwent stone removal with sphincterotomy.  Cholecystectomy was scheduled however it was canceled as cardiology could not clear him for surgery.  General surgery has signed off and recommended outpatient follow-up in the office.  Patient was treated empirically with antibiotics and finished a course while hospitalized. Pt reports occasional intermittent abd pain, resolves spontaneously, and repeat CT scan showed  extensive pneumobilia is noted in the left hepatic ducts as well as in the common bile and cystic ducts. Large amount of air is also noted in the gallbladder lumen. This most likely is due to recent instrumentation.  -Initial plans were for patient to be discharged, case was run by Dr. 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 Cardiology on board and has ordered a PYP scan as outpatient to evaluate for amyloid, SPEP, UPEP and light chains have been ordered. Recommend outpatient follow up. He was initially started on IV Lasix for fluid overload but has been discontinued due to worsening renal parameters.  His renal function has stabilized, resume Lasix but hold spironolactone on discharge, repeat renal function in a week.  Outpatient follow-up with cardiology Mild thrombocytopenia -improved  Lower GI bleed in the setting of C. difficile colitis, hemorrhagic shock-Patient was on Eliquis for anticoagulation for atrial fibrillation which was reversed with Kcentra when he started having GI bleed.  GI consulted patient underwent EGD was found to have a bleeding all vessel at the sphincterotomy site underwent Endo clipping of the bleeding vessel.  Patient was transferred to ICU for hemorrhagic shock and was on Levophed briefly.  Patient currently off all pressors and hemoglobin stable between 8 and 9. No further bleeding seen.    C. difficile diarrhea/ COLITIS-Resolved. Completed the course of oral vancomycin.   DM2 -Hemoglobin A1c is 6.6%.No changes in meds.   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    Paroxysmal  atrial  fibrillation -Rate controlled and started the patient on eliquis.   Coronary artery disease s/p NSTEMI in 2008 s/p stent to RCA and LAD Continue with statin at this time  Mild acute encephalopathy - Probably due to acute illness. Pt appears to be at baseline. MRI brain is negative for acute stroke.   Failure to thrive, poor oral intake-Therapy evaluations are recommending SNF  Scheduled Meds:  apixaban  5 mg Oral BID   Chlorhexidine Gluconate Cloth  6 each Topical Daily   insulin aspart  0-9 Units Subcutaneous TID WC   mouth rinse  15 mL Mouth Rinse BID   melatonin  3 mg Oral QHS   pantoprazole  40 mg Oral BID   rosuvastatin  40 mg Oral QHS   Continuous Infusions:  sodium chloride     PRN Meds:.acetaminophen **OR** acetaminophen, alum & mag hydroxide-simeth, naphazoline-glycerin, ondansetron (ZOFRAN) IV, traMADol, Zinc Oxide  Diet Orders (From admission, onward)     Start     Ordered   07/27/21 1404  Diet regular Room service appropriate? Yes; Fluid consistency: Thin  Diet effective now       Question Answer Comment  Room service appropriate? Yes   Fluid consistency: Thin      07/27/21 1403            DVT prophylaxis: SCDs Start: 07/18/21 0143 apixaban (ELIQUIS) tablet 5 mg     Code Status: DNR  Family Communication: Son over the phone  Status is: Inpatient  Remains inpatient appropriate because:Inpatient level of care appropriate due to severity of illness  Dispo: The patient is from: SNF              Anticipated d/c is to: SNF              Patient currently is not medically stable to d/c.   Difficult to place patient No   Level of care: Telemetry  Consultants:  GI PCCM  Procedures:  ERCP EGD  Microbiology  none  Antimicrobials: None currently    Objective: Vitals:   08/02/21 0730 08/02/21 1439 08/02/21 2104 08/03/21 0458  BP:  94/63 92/64 100/67  Pulse:  64 76 63  Resp:  '15 14 14  '$ Temp:  97.8 F (36.6 C) 98.2 F (36.8 C) 97.9 F (36.6  C)  TempSrc:  Oral Oral Oral  SpO2:  100% 98% 98%  Weight: 94.2 kg   94.6 kg  Height:        Intake/Output Summary (Last 24 hours) at 08/03/2021 1330 Last data filed at 08/03/2021 0511 Gross per 24 hour  Intake --  Output 600 ml  Net -600 ml   Filed Weights   08/01/21 0530 08/02/21 0730 08/03/21 0458  Weight: 92.2 kg 94.2 kg 94.6 kg    Examination:  Constitutional: NAD Eyes: no scleral icterus ENMT: Mucous membranes are moist.  Neck: normal, supple Respiratory: clear to auscultation bilaterally, no wheezing, no crackles. Normal respiratory effort.  Cardiovascular: Regular rate and rhythm, no murmurs / rubs / gallops. No LE edema. Good peripheral pulses Abdomen: non distended, no tenderness. Bowel sounds positive.  Musculoskeletal: no clubbing / cyanosis.  Skin: no rashes Neurologic: non focal    Data Reviewed: I have independently reviewed following labs and imaging studies  CBC: Recent Labs  Lab 07/29/21 0538 07/31/21 0607 08/01/21 0909 08/02/21 0444 08/03/21 0505  WBC 9.9 8.0 8.8 8.6 8.8  NEUTROABS  --   --  7.2  --   --   HGB  8.5* 8.0* 8.4* 8.4* 8.6*  HCT 27.0* 25.3* 26.9* 26.7* 26.9*  MCV 83.6 83.5 84.3 83.2 81.0  PLT 149* 161 143* 143* [id number]*   Basic Metabolic Panel: Recent Labs  Lab 07/29/21 0538 07/30/21 0919 07/31/21 0607 08/01/21 0909 08/03/21 0505  NA 137 136 135 130* 136  K 4.3 4.4 4.3 3.8 4.0  CL 98 102 102 97* 104  CO2 '26 27 26 26 25  '$ GLUCOSE 116* 152* 160* 203* 177*  BUN 35* 33* 30* 26* 25*  CREATININE 1.77* 1.93* 1.88* 1.87* 1.72*  CALCIUM 8.4* 8.6* 8.5* 8.1* 8.4*   Liver Function Tests: Recent Labs  Lab 07/28/21 1223 07/29/21 0538 07/30/21 0919 08/01/21 0909 08/03/21 0505  AST 39 45* 53* 123* 289*  ALT '25 24 23 31 '$ 64*  ALKPHOS 316* 302* 285* 241* 231*  BILITOT 2.0* 2.1* 1.7* 1.7* 1.5*  PROT 5.6* 5.3* 5.7* 5.5* 5.1*  ALBUMIN 2.4* 2.4* 2.5* 2.4* 2.3*   Coagulation Profile: No results for input(s): INR, PROTIME in the last  168 hours. HbA1C: No results for input(s): HGBA1C in the last 72 hours. CBG: Recent Labs  Lab 08/02/21 1825 08/03/21 0019 08/03/21 0500 08/03/21 0848 08/03/21 1144  GLUCAP 193* 186* 178* 135* 191*    Recent Results (from the past 240 hour(s))  MRSA Next Gen by PCR, Nasal     Status: None   Collection Time: 07/24/21  3:27 PM   Specimen: Nasal Mucosa; Nasal Swab  Result Value Ref Range Status   MRSA by PCR Next Gen NOT DETECTED NOT DETECTED Final    Comment: (NOTE) The GeneXpert MRSA Assay (FDA approved for NASAL specimens only), is one component of a comprehensive MRSA colonization surveillance program. It is not intended to diagnose MRSA infection nor to guide or monitor treatment for MRSA infections. Test performance is not FDA approved in patients less than 44 years old. Performed at [facility], [place] [address], [place], [place] [address]   Resp Panel by RT-PCR (Flu A&B, Covid) Nasopharyngeal Swab     Status: None   Collection Time: 08/01/21  9:52 AM   Specimen: Nasopharyngeal Swab; Nasopharyngeal(NP) swabs in vial transport medium  Result Value Ref Range Status   SARS Coronavirus 2 by RT PCR NEGATIVE NEGATIVE Final    Comment: (NOTE) SARS-CoV-2 target nucleic acids are NOT DETECTED.  The SARS-CoV-2 RNA is generally detectable in upper respiratory specimens during the acute phase of infection. The lowest concentration of SARS-CoV-2 viral copies this assay can detect is 138 copies/mL. A negative result does not preclude SARS-Cov-2 infection and should not be used as the sole basis for treatment or other patient management decisions. A negative result may occur with  improper specimen collection/handling, submission of specimen other than nasopharyngeal swab, presence of viral mutation(s) within the areas targeted by this assay, and inadequate number of viral copies(<138 copies/mL). A negative result must be combined with clinical observations,  patient history, and epidemiological information. The expected result is Negative.  Fact Sheet for Patients:  EntrepreneurPulse.com.au  Fact Sheet for Healthcare Providers:  IncredibleEmployment.be  This test is no t yet approved or cleared by the [place] FDA and  has been authorized for detection and/or diagnosis of SARS-CoV-2 by FDA under an Emergency Use Authorization (EUA). This EUA will remain  in effect (meaning this test can be used) for the duration of the COVID-19 declaration under Section 564(b)(1) of the Act, 21 U.S.C.section 360bbb-3(b)(1), unless the authorization is terminated  or revoked sooner.  Influenza A by PCR NEGATIVE NEGATIVE Final   Influenza B by PCR NEGATIVE NEGATIVE Final    Comment: (NOTE) The Xpert Xpress SARS-CoV-2/FLU/RSV plus assay is intended as an aid in the diagnosis of influenza from Nasopharyngeal swab specimens and should not be used as a sole basis for treatment. Nasal washings and aspirates are unacceptable for Xpert Xpress SARS-CoV-2/FLU/RSV testing.  Fact Sheet for Patients: EntrepreneurPulse.com.au  Fact Sheet for Healthcare Providers: IncredibleEmployment.be  This test is not yet approved or cleared by the [place] FDA and has been authorized for detection and/or diagnosis of SARS-CoV-2 by FDA under an Emergency Use Authorization (EUA). This EUA will remain in effect (meaning this test can be used) for the duration of the COVID-19 declaration under Section 564(b)(1) of the Act, 21 U.S.C. section 360bbb-3(b)(1), unless the authorization is terminated or revoked.  Performed at [facility], [place] [address] [place], [place] [address]      Radiology Studies: No results found.   Marzetta Board, MD, PhD [facility]  Between 7 am - 7 pm I am available, please contact me via Amion (for emergencies) or Securechat (non  urgent messages)  Between 7 pm - 7 am I am not available, please contact night coverage MD/APP via Amion

## 2021-08-03 NOTE — Discharge Summary (Deleted)
Physician Discharge Summary  [name] [name] [id number] DOB: 10-19-1951 DOA: 07/17/2021  PCP: Raeanne Gathers, MD  Admit date: 07/17/2021 Discharge date: 08/03/2021  Admitted From: SNF Disposition:  SNF  Recommendations for Outpatient Follow-up:  Follow up with PCP in 1-2 weeks Please obtain BMP/CBC in one week  Home Health: none Equipment/Devices: none  Discharge Condition: stable CODE STATUS: DNR Diet recommendation: regular  HPI: Per admitting MD, [name] [name] is a 70 y.o. male with medical history significant for DMT2, Chronic systolic CHF, CKD 3, PAF, CAD, HTN, OA who presents for evaluation of right upper abdominal pain.  Reports he has had pain for the last 3 days and has progressively gotten worse.  He is also had some mild intermittent diarrhea as well as mild nausea but no vomiting with his symptoms.  He states he has not had any fevers or chills.  He has had decreased appetite and p.o. intake secondary to not feeling well and stomach pain.  States he has never had abdominal pain like this in the past.  He has had a hernia repair but no other abdominal surgeries.  He denies any chest pain, palpitations, shortness of breath.  He states that the pain will radiate around to his right flank region.  Not had any abdominal injury or trauma to his back.  He denies any dysuria or urinary hesitancy or frequency.  He is widowed and lives alone.  Denies tobacco, alcohol, illicit drug use.  Hospital Course / Discharge diagnoses: Narrative 70 year old gentleman prior history of hypertension, diabetes, chronic systolic heart failure, stage IIIa CKD, paroxysmal atrial fibrillation, coronary artery disease presents with abdominal pain and diarrhea patient was found to have acute cholecystitis with choledocholithiasis and mild pancreatitis.  He was started on broad-spectrum antibiotics and general surgery, GI consulted.  In view of increased surgical risk patient did not undergo a  cholecystectomy.  He was treated conservatively with antibiotics.  Hospital course complicated by diarrhea which was found to be C. difficile diarrhea, GI bleed probably secondary to Eliquis.  Patient received Kcentra for Eliquis reversal, GI consulted underwent EGD found to have a bleeding vessel at the sphincterotomy site which was endoclipped on 07/24/2021.  He was transferred to ICU for hemorrhagic shock briefly requiring Levophed.  He is currently off all pressors. Over the last one week, we have watched his hemoglobin slowly drop from 9.6 to 8.4,. Eliquis was started on 08/01/21  Principal problem Acute cholecystitis/choledocholithiasis/pain biliary pancreatitis -gastroenterology and general surgery consulted on admission.  S/p ERCP showing choledocholithiasis underwent stone removal with sphincterotomy.  Cholecystectomy was scheduled however it was canceled as cardiology could not clear him for surgery.  General surgery has signed off and recommended outpatient follow-up in the office.  Patient was treated empirically with antibiotics and finished a course while hospitalized. Pt reports occasional intermittent abd pain, resolves spontaneously, and repeat CT scan showed extensive pneumobilia is noted in the left hepatic ducts as well as in the common bile and cystic ducts. Large amount of air is also noted in the gallbladder lumen. This most likely is due to recent instrumentation.   Active problems Mild acute on chronic combined systolic and diastolic heart failure -Echocardiogram showed left ventricular ejection fraction of 20 to 25% with severe right ventricular dysfunction. Cardiology on board and has ordered a PYP scan as outpatient to evaluate for amyloid, SPEP, UPEP and light chains have been ordered. Recommend outpatient follow up. He was initially started on IV Lasix for fluid overload but  has been discontinued due to worsening renal parameters.  His renal function has stabilized, resume Lasix but  hold spironolactone on discharge, repeat renal function in a week.  Outpatient follow-up with cardiology Mild thrombocytopenia -improved  Lower GI bleed in the setting of C. difficile colitis, hemorrhagic shock-Patient was on Eliquis for anticoagulation for atrial fibrillation which was reversed with Kcentra when he started having GI bleed.  GI consulted patient underwent EGD was found to have a bleeding all vessel at the sphincterotomy site underwent Endo clipping of the bleeding vessel.  Patient was transferred to ICU for hemorrhagic shock and was on Levophed briefly.  Patient currently off all pressors and hemoglobin stable between 8 and 9. No further bleeding seen.  Hemoglobin is stable, 8.6 this morning and actually improving on its own. C. difficile diarrhea/ COLITIS-Resolved. Completed the course of oral vancomycin.  DM2 -Hemoglobin A1c is 6.6%.No changes in meds.  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  Gently resume Lasix and hold spironolactone, repeat renal function in a week Paroxysmal atrial fibrillation -Rate controlled and started the patient on eliquis.  Coronary artery disease s/p NSTEMI in 2008 s/p stent to RCA and LAD Continue with statin at this time Mild acute encephalopathy - Probably due to acute illness. Pt appears to be at baseline. MRI brain is negative for acute stroke.  Failure to thrive, poor oral intake-Therapy evaluations are recommending SNF  Sepsis ruled out   Discharge Instructions   Allergies as of 08/03/2021       Reactions   Allopurinol Other (See Comments)   Felt terrible   Aripiprazole Other (See Comments)   Felt terrible   Bupropion Other (See Comments)   Felt bad   Buspirone Other (See Comments)   Felt bad   Desvenlafaxine Other (See Comments)   Made him hyper   Duloxetine Other (See Comments)   Knocked him out   Escitalopram Other (See Comments)   Felt like  crap   Fish Oil    unknown   Fluticasone Furoate Other (See Comments)   Unknown    Hydroxyzine Hcl Other (See Comments)   Knocked him out   Nortriptyline Other (See Comments)   Ineffective   Paroxetine Hcl Other (See Comments)   unknown   Ramipril Cough   Sertraline Other (See Comments)   "felt plugged in"   Venlafaxine Other (See Comments)   Felt bad   Vilazodone Other (See Comments)   "felt weird"        Medication List     STOP taking these medications    Cialis 20 MG tablet Generic drug: tadalafil   spironolactone 25 MG tablet Commonly known as: ALDACTONE       TAKE these medications    albuterol 108 (90 Base) MCG/ACT inhaler Commonly known as: VENTOLIN HFA Inhale 2 puffs into the lungs every 6 (six) hours as needed for shortness of breath.   ALPRAZolam 0.25 MG tablet Commonly known as: XANAX Take 1 tablet (0.25 mg total) by mouth at bedtime as needed for anxiety.   apixaban 5 MG Tabs tablet Commonly known as: ELIQUIS Take 1 tablet (5 mg total) by mouth 2 (two) times daily.   colchicine 0.6 MG tablet Take 0.6 mg by mouth daily as needed for other. Gout flare up   cyanocobalamin 1000 MCG/ML injection Commonly known as: (VITAMIN B-12) Inject 1,000 mcg into the muscle every 30 (thirty) days.   dapagliflozin  propanediol 10 MG Tabs tablet Commonly known as: FARXIGA Take 1 tablet (10 mg total) by mouth at bedtime.   fluticasone 50 MCG/ACT nasal spray Commonly known as: FLONASE Place 1 spray into both nostrils daily as needed for allergies or rhinitis.   furosemide 40 MG tablet Commonly known as: LASIX Take 1 tablet (40 mg total) by mouth daily.   Lantus SoloStar 100 UNIT/ML Solostar Pen Generic drug: insulin glargine Inject 18 Units into the skin at bedtime.   levalbuterol 45 MCG/ACT inhaler Commonly known as: XOPENEX HFA Inhale 1 puff into the lungs every 4 (four) hours as needed for wheezing.   potassium chloride SA 20 MEQ tablet Commonly  known as: KLOR-CON Take 1 tablet (20 mEq total) by mouth daily.   rosuvastatin 40 MG tablet Commonly known as: CRESTOR Take 1 tablet (40 mg total) by mouth at bedtime.   traMADol 50 MG tablet Commonly known as: ULTRAM Take 0.5 tablets (25 mg total) by mouth every 4 (four) hours as needed. What changed: reasons to take this        Follow-up Information     Raeanne Gathers, MD. Schedule an appointment as soon as possible for a visit in 2 days.   Specialty: Family Medicine Why: Follow up from ER visit Contact information: [place] [address] [phone]         [place] DEPT. Go to .   Specialty: Emergency Medicine Why: As needed Contact information: [place] [id number] [place] [phone]        Surgery, [place]. Schedule an appointment as soon as possible for a visit in 6 month(s).   Specialty: General Surgery Why: Schedule an appointment to discuss elective cholecystectomy if cleared by cardiology to undergo surgery. Contact information: [address] [place] [address] [phone]         [facility] Follow up on 07/26/2021.   Specialty: Cardiology Why: 2:30PM. Heart failure clinic follow up Contact information: [id number] [place] [phone]                Consultations: GI General surgery Cardiology PCCM  Procedures/Studies: ERCP EGD  CT ABDOMEN PELVIS WO CONTRAST  Result Date: 07/28/2021 CLINICAL DATA:  Acute generalized abdominal pain.  Elevated lipase. EXAM: CT ABDOMEN AND PELVIS WITHOUT CONTRAST TECHNIQUE: Multidetector CT imaging of the abdomen and pelvis was performed following the standard protocol without IV contrast. COMPARISON:  July 17, 2021. FINDINGS: Lower chest: Small bilateral pleural effusions are noted with  adjacent subsegmental atelectasis. Hepatobiliary: No focal abnormality is noted in the liver on these unenhanced images. Left hepatic pneumobilia is noted as well as air noted in common bile duct and cystic duct. Large amount of air is also noted in the gallbladder lumen. Moderate gallbladder wall thickening is noted without cholelithiasis, concerning for cholecystitis. There appears to be a biliary stent in the second portion of the duodenum. Pancreas: Unremarkable. No pancreatic ductal dilatation or surrounding inflammatory changes. Spleen: Normal in size without focal abnormality. Adrenals/Urinary Tract: Adrenal glands appear normal. Nonobstructive right renal calculus is noted. No hydronephrosis or renal obstruction is noted. Urinary bladder is unremarkable. Stomach/Bowel: Stomach is within normal limits. Appendix appears normal. No evidence of bowel wall thickening, distention, or inflammatory changes. Vascular/Lymphatic: Aortic atherosclerosis. No enlarged abdominal or pelvic lymph nodes. Reproductive: Prostate is unremarkable. Other: Minimal ascites is noted.  No definite hernia  is noted. Musculoskeletal: No acute or significant osseous findings. IMPRESSION: Small bilateral pleural effusions are noted with adjacent subsegmental atelectasis. Extensive pneumobilia is noted in the left hepatic ducts as well as in the common bile and cystic ducts. Large amount of air is also noted in the gallbladder lumen. This most likely is due to recent instrumentation. Moderate gallbladder wall thickening is noted without cholelithiasis concerning for cholecystitis. Nonobstructive right renal calculus. Minimal ascites. Aortic Atherosclerosis (ICD10-I70.0). Electronically Signed   By: Marijo Conception M.D.   On: 07/28/2021 19:57   CT ABDOMEN PELVIS WO CONTRAST  Result Date: 07/18/2021 CLINICAL DATA:  Abdominal pain x3 days. EXAM: CT ABDOMEN AND PELVIS WITHOUT CONTRAST TECHNIQUE: Multidetector CT imaging of the abdomen and  pelvis was performed following the standard protocol without IV contrast. COMPARISON:  None. FINDINGS: Lower chest: Moderate severity atelectasis and/or infiltrate is seen within the posterior aspects of the bilateral lung bases. There are small bilateral pleural effusions. Hepatobiliary: No focal liver abnormality is seen. The gallbladder is markedly distended with diffuse gallbladder wall thickening and pericholecystic inflammation. A 2 mm gallstone is suspected within the distal common bile duct (axial CT image 39, CT series 2). Pancreas: Mild inflammatory fat stranding is seen along the posterior aspect of the tail of the pancreas. The pancreatic parenchyma is otherwise normal in appearance. Spleen: Normal in size without focal abnormality. Adrenals/Urinary Tract: Adrenal glands are unremarkable. Kidneys are normal in size, without obstructing renal calculi or hydronephrosis. A 9 mm nonobstructing renal stone is seen within the mid right kidney. A 1.4 cm diameter cyst is seen within the anterior aspect of the mid right kidney. Bladder is unremarkable. Stomach/Bowel: Stomach is within normal limits. Appendix appears normal. No evidence of bowel wall thickening, distention, or inflammatory changes. Vascular/Lymphatic: Aortic atherosclerosis. No enlarged abdominal or pelvic lymph nodes. Reproductive: Prostate is unremarkable. Other: No abdominal wall hernia or abnormality. A small amount of abdominal and pelvic free fluid is seen with diffuse nonspecific mesenteric inflammatory fat stranding noted. Musculoskeletal: Degenerative changes are seen within the lumbar spine, most prominent at the level of L5-S1. IMPRESSION: 1. Findings consistent with acute cholecystitis with a tiny gallstone noted within the distal common bile duct. 2. Mild acute pancreatitis along the tail of the pancreas can not be excluded. Correlation with pancreatic enzymes is recommended. 3. 9 mm nonobstructing renal stone within the right kidney.  Electronically Signed   By: Virgina Norfolk M.D.   On: 07/18/2021 00:02   DG Chest 2 View  Result Date: 07/12/2021 CLINICAL DATA:  Shortness of breath EXAM: CHEST - 2 VIEW COMPARISON:  05/03/2021 FINDINGS: Heart is mildly enlarged. No confluent opacities or edema. Small left pleural effusion. No acute bony abnormality. IMPRESSION: Cardiomegaly.  Small left pleural effusion. Electronically Signed   By: Rolm Baptise M.D.   On: 07/12/2021 22:53   CT HEAD WO CONTRAST (5MM)  Result Date: 07/22/2021 CLINICAL DATA:  Mental status change.  Unknown cause. EXAM: CT HEAD WITHOUT CONTRAST TECHNIQUE: Contiguous axial images were obtained from the base of the skull through the vertex without intravenous contrast. COMPARISON:  CT head 11/04/2018. FINDINGS: Brain: There is no evidence of acute intracranial hemorrhage, mass lesion, brain edema or extra-axial fluid collection. Stable mild atrophy with prominence of the ventricles and subarachnoid spaces. There are mild chronic small vessel ischemic changes in the periventricular white matter. There is no CT evidence of acute cortical infarction. Vascular: Intracranial vascular calcifications. No hyperdense vessel identified. Skull: Negative for fracture or focal lesion.  Sinuses/Orbits: The visualized paranasal sinuses and mastoid air cells are clear. No orbital abnormalities are seen. Other: None. IMPRESSION: 1. Stable head CT without acute intracranial findings. 2. Mild atrophy and chronic small vessel ischemic changes. Electronically Signed   By: Richardean Sale M.D.   On: 07/22/2021 17:53   MR BRAIN WO CONTRAST  Result Date: 07/29/2021 CLINICAL DATA:  Initial evaluation for mental status change. EXAM: MRI HEAD WITHOUT CONTRAST TECHNIQUE: Multiplanar, multiecho pulse sequences of the brain and surrounding structures were obtained without intravenous contrast. COMPARISON:  Head CT from 07/22/2021. FINDINGS: Brain: Diffuse prominence of the CSF containing spaces  compatible with generalized cerebral atrophy. No significant cerebral white matter disease for age. No abnormal foci of restricted diffusion to suggest acute or subacute ischemia. Gray-white matter differentiation maintained. No encephalomalacia to suggest chronic cortical infarction. No acute intracranial hemorrhage. Single punctate chronic microhemorrhage noted within the right frontal centrum semi ovale, of doubtful significance in isolation. No mass lesion, midline shift or mass effect. Mild ventricular prominence related to global parenchymal volume loss without hydrocephalus. No extra-axial fluid collection. Pituitary gland suprasellar region normal. Midline structures intact and normal. Vascular: Right vertebral artery hypoplastic and not well seen. Major intracranial vascular flow voids otherwise maintained. Skull and upper cervical spine: Craniocervical junction within normal limits. Bone marrow signal intensity within normal limits. No scalp soft tissue abnormality. Sinuses/Orbits: Globes and orbital soft tissues demonstrate no acute finding. Paranasal sinuses are largely clear. No significant mastoid effusion. Inner ear structures grossly normal. Other: None. IMPRESSION: 1. No acute intracranial abnormality. 2. Mildly advanced cerebral atrophy for age. Otherwise unremarkable brain MRI. Electronically Signed   By: Jeannine Boga M.D.   On: 07/29/2021 00:30   DG Chest Port 1 View  Result Date: 07/19/2021 CLINICAL DATA:  Respiratory distress. EXAM: PORTABLE CHEST 1 VIEW COMPARISON:  July 18, 2021 FINDINGS: Mild, stable areas of linear scarring and/or atelectasis are seen within the bilateral lung bases. There is no evidence of a pleural effusion or pneumothorax. The cardiac silhouette is enlarged and unchanged in size. The visualized skeletal structures are unremarkable. IMPRESSION: Mild, stable bibasilar linear scarring and/or atelectasis. Electronically Signed   By: Virgina Norfolk M.D.   On:  07/19/2021 17:07   DG CHEST PORT 1 VIEW  Result Date: 07/18/2021 CLINICAL DATA:  Weakness EXAM: PORTABLE CHEST 1 VIEW COMPARISON:  07/12/2021 FINDINGS: Cardiac shadow is enlarged but stable. Increasing right basilar atelectasis is noted. Left lung remains clear. No bony abnormality is noted. IMPRESSION: Increasing right basilar atelectasis. Electronically Signed   By: Inez Catalina M.D.   On: 07/18/2021 14:36   DG ERCP  Result Date: 07/19/2021 CLINICAL DATA:  Gallstone pancreatitis EXAM: ERCP TECHNIQUE: Multiple spot images obtained with the fluoroscopic device and submitted for interpretation post-procedure. FLUOROSCOPY TIME:  Fluoroscopy Time:  5 minutes 52 seconds Number of Acquired Spot Images: 0 COMPARISON:  CT abdomen/pelvis 07/17/2021 FINDINGS: A total of 22 intraoperative spot images are submitted for review. The images demonstrate a flexible duodenal scope in the descending duodenum with wire cannulation of the main pancreatic duct and common bile duct. Subsequent images demonstrate cholangiography, sphincterotomy and balloon sweeping of the common duct. IMPRESSION: ERCP with sphincterotomy and balloon sweeping of the common duct. These images were submitted for radiologic interpretation only. Please see the procedural report for the amount of contrast and the fluoroscopy time utilized. Electronically Signed   By: Jacqulynn Cadet M.D.   On: 07/19/2021 15:11   XR Knee 1-2 Views Right  Result Date:  07/05/2021 Right knee 2 views: No acute fracture.  Knee is well located.  Total knee arthroplasty components well-seated.  No complicating features.  ECHOCARDIOGRAM LIMITED  Result Date: 07/19/2021    ECHOCARDIOGRAM LIMITED REPORT   Patient Name:   [name] [name] Date of Exam: 07/19/2021 Medical Rec #:  [id number]          Height:       73.0 in Accession #:    [id number]         Weight:       219.1 lb Date of Birth:  08/31/1951          BSA:          2.237 m Patient Age:    69 years           BP:            114/85 mmHg Patient Gender: M                  HR:           92 bpm. Exam Location:  Inpatient Procedure: Limited Echo, Limited Color Doppler and Cardiac Doppler Indications:    R94.31 Abnormal EKG  History:        Patient has prior history of Echocardiogram examinations, most                 recent 05/06/2021. CHF and Idiopathic CMP, Previous Myocardial                 Infarction and CAD, Arrythmias:Atrial Fibrillation; Risk                 Factors:Diabetes, Hypertension, Former Smoker and Dyslipidemia.  Sonographer:    Bernadene Person RDCS Referring Phys: [place]  1. 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 Left ventricular ejection fraction, by estimation, is 20 to 25%. The left ventricle has severely decreased function. The left ventricle demonstrates global hypokinesis. There is moderate concentric left ventricular hypertrophy. Left ventricular diastolic function could not be evaluated. There is the interventricular septum is flattened in systole and diastole, consistent with right ventricular pressure and volume overload. There is global hypokinesis with disporportionately severe inferior and inferolateral hypokinesis, consistent with scar in the right coronary artery territory.  2. Right ventricular systolic function is severely reduced. Tricuspid regurgitation signal is inadequate for assessing PA pressure.  3. Left atrial size was mild to moderately dilated.  4. Right atrial size was severely dilated.  5. The pericardial effusion is localized near the right atrium.  6. The mitral valve is normal in structure. No evidence of mitral valve regurgitation. No evidence of mitral stenosis.  7. The aortic valve is normal in structure. There is mild thickening of the aortic valve. Aortic valve regurgitation is not visualized. Mild aortic valve sclerosis is  present, with no evidence of aortic valve stenosis.  8. The inferior vena cava is dilated in size with <50% respiratory variability, suggesting right atrial pressure of 15 mmHg. Comparison(s): The rhythm is now atrial fibrillation. Otherwise, No significant change from prior study. Prior images reviewed side by side. 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 There is evidence of severely reduced cardiac output. 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 Left ventricular  ejection fraction, by estimation, is 20 to 25%. The  left ventricle has severely decreased function. The left ventricle demonstrates global hypokinesis. There is moderate concentric left ventricular hypertrophy. The interventricular septum is flattened in systole and diastole, consistent with right ventricular pressure and volume overload. Left ventricular diastolic function could not be evaluated. Left ventricular diastolic function could not be evaluated due to atrial fibrillation.  LV Wall Scoring: The inferior wall, mid inferoseptal segment, and basal inferoseptal segment are akinetic. The entire anterior wall, entire lateral wall, entire anterior septum, and entire apex are hypokinetic. There is global hypokinesis with disporportionately severe inferior and inferolateral hypokinesis, consistent with scar in the right coronary artery territory. Right Ventricle: Right ventricular systolic function is severely reduced. Tricuspid regurgitation signal is inadequate for assessing PA pressure. Left Atrium: Left atrial size was mild to moderately dilated. Right Atrium: Right atrial size was severely dilated. Pericardium: Trivial pericardial effusion is present. The  pericardial effusion is localized near the right atrium. Mitral Valve: The mitral valve is normal in structure. No evidence of mitral valve stenosis. Tricuspid Valve: Tricuspid valve regurgitation is mild. Aortic Valve: The aortic valve is normal in structure. There is mild thickening of the aortic valve. Aortic valve regurgitation is not visualized. Mild aortic valve sclerosis is present, with no evidence of aortic valve stenosis. Pulmonic Valve: The pulmonic valve was grossly normal. Pulmonic valve regurgitation is not visualized. Venous: The inferior vena cava is dilated in size with less than 50% respiratory variability, suggesting right atrial pressure of 15 mmHg. Additional Comments: There is pleural effusion in the left lateral region. Moderate ascites is present. LEFT VENTRICLE PLAX 2D LVIDd:         5.00 cm LVIDs:         4.40 cm LV PW:         1.60 cm LV IVS:        1.70 cm LVOT diam:     2.10 cm LV SV:         27 LV SV Index:   12 LVOT Area:     3.46 cm  LV Volumes (MOD) LV vol d, MOD A2C: 151.0 ml LV vol d, MOD A4C: 152.0 ml LV vol s, MOD A2C: 119.0 ml LV vol s, MOD A4C: 105.0 ml LV SV MOD A2C:     32.0 ml LV SV MOD A4C:     47.0 ml LV SV MOD BP:      41.3 ml RIGHT VENTRICLE TAPSE (M-mode): 1.0 cm LEFT ATRIUM           Index       RIGHT ATRIUM           Index LA diam:      4.30 cm 1.92 cm/m  RA Area:     30.76 cm 13.75 cm/m LA Vol (A4C): 57.8 ml 25.82 ml/m RA Volume:   120.58 ml 53.91 ml/m  AORTIC VALVE LVOT Vmax:   61.17 cm/s LVOT Vmean:  35.800 cm/s LVOT VTI:    0.078 m  AORTA Ao Root diam: 3.70 cm  SHUNTS Systemic VTI:  0.08 m Systemic Diam: 2.10 cm Dani Gobble Croitoru MD Electronically signed by Sanda Klein MD Signature Date/Time: 07/19/2021/1:09:52 PM    Final    US Abdomen Limited RUQ (LIVER/GB)  Result Date: 07/17/2021 CLINICAL DATA:  Right upper quadrant pain for 4 days. EXAM: ULTRASOUND ABDOMEN LIMITED RIGHT UPPER QUADRANT COMPARISON:  None. FINDINGS: Gallbladder: The gallbladder wall is  thickened and mildly irregular. Sludge is identified.  At least one 6 mm stone is noted. Pericholecystic fluid is identified. No Murphy's sign reported. Common bile duct: Diameter: 3 mm Liver: No focal lesion identified. Within normal limits in parenchymal echogenicity. Portal vein is patent on color Doppler imaging with normal direction of blood flow towards the liver. Other: None. IMPRESSION: 1. The gallbladder is abnormal in appearance with a mildly irregular thickened wall. There is sludge in the gallbladder as well as at least 1 stone. Pericholecystic fluid is noted. No Murphy's sign. If the clinical picture remains ambiguous, recommend a HIDA scan. 2. A small amount of free fluid is seen in the abdomen. 3. No other acute abnormalities. Electronically Signed   By: Dorise Bullion III M.D.   On: 07/17/2021 21:01     Subjective: - no chest pain, shortness of breath, no abdominal pain, nausea or vomiting.   Discharge Exam: BP 100/67 (BP Location: Left Arm)   Pulse 63   Temp 97.9 F (36.6 C) (Oral)   Resp 14   Ht '6\' 1"'$  (1.854 m)   Wt 94.6 kg   SpO2 98%   BMI 27.52 kg/m   General: Pt is alert, awake, not in acute distress Cardiovascular: RRR, S1/S2 +, no rubs, no gallops Respiratory: CTA bilaterally, no wheezing, no rhonchi Abdominal: Soft, NT, ND, bowel sounds + Extremities: no edema, no cyanosis    The results of significant diagnostics from this hospitalization (including imaging, microbiology, ancillary and laboratory) are listed below for reference.     Microbiology: Recent Results (from the past 240 hour(s))  MRSA Next Gen by PCR, Nasal     Status: None   Collection Time: 07/24/21  3:27 PM   Specimen: Nasal Mucosa; Nasal Swab  Result Value Ref Range Status   MRSA by PCR Next Gen NOT DETECTED NOT DETECTED Final    Comment: (NOTE) The GeneXpert MRSA Assay (FDA approved for NASAL specimens only), is one component of a comprehensive MRSA colonization surveillance program. It  is not intended to diagnose MRSA infection nor to guide or monitor treatment for MRSA infections. Test performance is not FDA approved in patients less than 59 years old. Performed at [facility], [place] [address] [place], [place] [address]   Resp Panel by RT-PCR (Flu A&B, Covid) Nasopharyngeal Swab     Status: None   Collection Time: 08/01/21  9:52 AM   Specimen: Nasopharyngeal Swab; Nasopharyngeal(NP) swabs in vial transport medium  Result Value Ref Range Status   SARS Coronavirus 2 by RT PCR NEGATIVE NEGATIVE Final    Comment: (NOTE) SARS-CoV-2 target nucleic acids are NOT DETECTED.  The SARS-CoV-2 RNA is generally detectable in upper respiratory specimens during the acute phase of infection. The lowest concentration of SARS-CoV-2 viral copies this assay can detect is 138 copies/mL. A negative result does not preclude SARS-Cov-2 infection and should not be used as the sole basis for treatment or other patient management decisions. A negative result may occur with  improper specimen collection/handling, submission of specimen other than nasopharyngeal swab, presence of viral mutation(s) within the areas targeted by this assay, and inadequate number of viral copies(<138 copies/mL). A negative result must be combined with clinical observations, patient history, and epidemiological information. The expected result is Negative.  Fact Sheet for Patients:  EntrepreneurPulse.com.au  Fact Sheet for Healthcare Providers:  IncredibleEmployment.be  This test is no t yet approved or cleared by the [place] FDA and  has been authorized for detection and/or diagnosis of SARS-CoV-2 by FDA under an Emergency  Use Authorization (EUA). This EUA will remain  in effect (meaning this test can be used) for the duration of the COVID-19 declaration under Section 564(b)(1) of the Act, 21 U.S.C.section 360bbb-3(b)(1), unless the authorization is  terminated  or revoked sooner.       Influenza A by PCR NEGATIVE NEGATIVE Final   Influenza B by PCR NEGATIVE NEGATIVE Final    Comment: (NOTE) The Xpert Xpress SARS-CoV-2/FLU/RSV plus assay is intended as an aid in the diagnosis of influenza from Nasopharyngeal swab specimens and should not be used as a sole basis for treatment. Nasal washings and aspirates are unacceptable for Xpert Xpress SARS-CoV-2/FLU/RSV testing.  Fact Sheet for Patients: EntrepreneurPulse.com.au  Fact Sheet for Healthcare Providers: IncredibleEmployment.be  This test is not yet approved or cleared by the [place] FDA and has been authorized for detection and/or diagnosis of SARS-CoV-2 by FDA under an Emergency Use Authorization (EUA). This EUA will remain in effect (meaning this test can be used) for the duration of the COVID-19 declaration under Section 564(b)(1) of the Act, 21 U.S.C. section 360bbb-3(b)(1), unless the authorization is terminated or revoked.  Performed at [facility], [address], [place], [place] [address]      Labs: Basic Metabolic Panel: Recent Labs  Lab 07/29/21 0538 07/30/21 0919 07/31/21 0607 08/01/21 0909 08/03/21 0505  NA 137 136 135 130* 136  K 4.3 4.4 4.3 3.8 4.0  CL 98 102 102 97* 104  CO2 '26 27 26 26 25  '$ GLUCOSE 116* 152* 160* 203* 177*  BUN 35* 33* 30* 26* 25*  CREATININE 1.77* 1.93* 1.88* 1.87* 1.72*  CALCIUM 8.4* 8.6* 8.5* 8.1* 8.4*   Liver Function Tests: Recent Labs  Lab 07/28/21 1223 07/29/21 0538 07/30/21 0919 08/01/21 0909 08/03/21 0505  AST 39 45* 53* 123* 289*  ALT '25 24 23 31 '$ 64*  ALKPHOS 316* 302* 285* 241* 231*  BILITOT 2.0* 2.1* 1.7* 1.7* 1.5*  PROT 5.6* 5.3* 5.7* 5.5* 5.1*  ALBUMIN 2.4* 2.4* 2.5* 2.4* 2.3*   CBC: Recent Labs  Lab 07/29/21 0538 07/31/21 0607 08/01/21 0909 08/02/21 0444 08/03/21 0505  WBC 9.9 8.0 8.8 8.6 8.8  NEUTROABS  --   --  7.2  --   --   HGB  8.5* 8.0* 8.4* 8.4* 8.6*  HCT 27.0* 25.3* 26.9* 26.7* 26.9*  MCV 83.6 83.5 84.3 83.2 81.0  PLT 149* 161 143* 143* 143*   CBG: Recent Labs  Lab 08/02/21 1216 08/02/21 1825 08/03/21 0019 08/03/21 0500 08/03/21 0848  GLUCAP 130* 193* 186* 178* 135*   Hgb A1c No results for input(s): HGBA1C in the last 72 hours. Lipid Profile No results for input(s): CHOL, HDL, LDLCALC, TRIG, CHOLHDL, LDLDIRECT in the last 72 hours. Thyroid function studies No results for input(s): TSH, T4TOTAL, T3FREE, THYROIDAB in the last 72 hours.  Invalid input(s): FREET3 Urinalysis    Component Value Date/Time   COLORURINE AMBER (A) 07/17/2021 1848   APPEARANCEUR CLOUDY (A) 07/17/2021 1848   LABSPEC 1.016 07/17/2021 1848   PHURINE 5.0 07/17/2021 1848   GLUCOSEU >=500 (A) 07/17/2021 1848   HGBUR MODERATE (A) 07/17/2021 1848   BILIRUBINUR NEGATIVE 07/17/2021 1848   KETONESUR NEGATIVE 07/17/2021 1848   PROTEINUR 100 (A) 07/17/2021 1848   NITRITE NEGATIVE 07/17/2021 1848   LEUKOCYTESUR NEGATIVE 07/17/2021 1848    FURTHER DISCHARGE INSTRUCTIONS:   Get Medicines reviewed and adjusted: Please take all your medications with you for your next visit with your Primary MD   Laboratory/radiological data: Please  request your Primary MD to go over all hospital tests and procedure/radiological results at the follow up, please ask your Primary MD to get all Hospital records sent to his/her office.   In some cases, they will be blood work, cultures and biopsy results pending at the time of your discharge. Please request that your primary care M.D. goes through all the records of your hospital data and follows up on these results.   Also Note the following: If you experience worsening of your admission symptoms, develop shortness of breath, life threatening emergency, suicidal or homicidal thoughts you must seek medical attention immediately by calling 911 or calling your MD immediately  if symptoms less severe.    You must read complete instructions/literature along with all the possible adverse reactions/side effects for all the Medicines you take and that have been prescribed to you. Take any new Medicines after you have completely understood and accpet all the possible adverse reactions/side effects.    Do not drive when taking Pain medications or sleeping medications (Benzodaizepines)   Do not take more than prescribed Pain, Sleep and Anxiety Medications. It is not advisable to combine anxiety,sleep and pain medications without talking with your primary care practitioner   Special Instructions: If you have smoked or chewed Tobacco  in the last 2 yrs please stop smoking, stop any regular Alcohol  and or any Recreational drug use.   Wear Seat belts while driving.   Please note: You were cared for by a hospitalist during your hospital stay. Once you are discharged, your primary care physician will handle any further medical issues. Please note that NO REFILLS for any discharge medications will be authorized once you are discharged, as it is imperative that you return to your primary care physician (or establish a relationship with a primary care physician if you do not have one) for your post hospital discharge needs so that they can reassess your need for medications and monitor your lab values.  Time coordinating discharge: 40 minutes  SIGNED:  Marzetta Board, MD, PhD 08/03/2021, 11:41 AM

## 2021-08-04 DIAGNOSIS — K810 Acute cholecystitis: Secondary | ICD-10-CM | POA: Diagnosis not present

## 2021-08-04 DIAGNOSIS — K8080 Other cholelithiasis without obstruction: Secondary | ICD-10-CM | POA: Diagnosis not present

## 2021-08-04 DIAGNOSIS — K8050 Calculus of bile duct without cholangitis or cholecystitis without obstruction: Secondary | ICD-10-CM | POA: Diagnosis not present

## 2021-08-04 LAB — COMPREHENSIVE METABOLIC PANEL
ALT: 74 U/L — ABNORMAL HIGH (ref 0–44)
AST: 279 U/L — ABNORMAL HIGH (ref 15–41)
Albumin: 2.3 g/dL — ABNORMAL LOW (ref 3.5–5.0)
Alkaline Phosphatase: 207 U/L — ABNORMAL HIGH (ref 38–126)
Anion gap: 8 (ref 5–15)
BUN: 25 mg/dL — ABNORMAL HIGH (ref 8–23)
CO2: 25 mmol/L (ref 22–32)
Calcium: 8.1 mg/dL — ABNORMAL LOW (ref 8.9–10.3)
Chloride: 99 mmol/L (ref 98–111)
Creatinine, Ser: 1.79 mg/dL — ABNORMAL HIGH (ref 0.61–1.24)
GFR, Estimated: 41 mL/min — ABNORMAL LOW (ref >60)
Glucose, Bld: 149 mg/dL — ABNORMAL HIGH (ref 70–99)
Potassium: 3.9 mmol/L (ref 3.5–5.1)
Sodium: 132 mmol/L — ABNORMAL LOW (ref 135–145)
Total Bilirubin: 1.3 mg/dL — ABNORMAL HIGH (ref 0.3–1.2)
Total Protein: 5.1 g/dL — ABNORMAL LOW (ref 6.5–8.1)

## 2021-08-04 LAB — CBC
HCT: 27.8 % — ABNORMAL LOW (ref 39.0–52.0)
Hemoglobin: 8.6 g/dL — ABNORMAL LOW (ref 13.0–17.0)
MCH: 25.3 pg — ABNORMAL LOW (ref 26.0–34.0)
MCHC: 30.9 g/dL (ref 30.0–36.0)
MCV: 81.8 fL (ref 80.0–100.0)
Platelets: 124 10*3/uL — ABNORMAL LOW (ref 150–400)
RBC: 3.4 MIL/uL — ABNORMAL LOW (ref 4.22–5.81)
RDW: 21 % — ABNORMAL HIGH (ref 11.5–15.5)
WBC: 7.3 10*3/uL (ref 4.0–10.5)
nRBC: 0 % (ref 0.0–0.2)

## 2021-08-04 LAB — GLUCOSE, CAPILLARY
Glucose-Capillary: 133 mg/dL — ABNORMAL HIGH (ref 70–99)
Glucose-Capillary: 129 mg/dL — ABNORMAL HIGH (ref 70–99)
Glucose-Capillary: 204 mg/dL — ABNORMAL HIGH (ref 70–99)
Glucose-Capillary: 217 mg/dL — ABNORMAL HIGH (ref 70–99)

## 2021-08-04 MED ORDER — FUROSEMIDE 40 MG PO TABS
40.0000 mg | ORAL_TABLET | Freq: Once | ORAL | Status: AC
  Administered 2021-08-04: 40 mg via ORAL
  Filled 2021-08-04: qty 1

## 2021-08-04 MED ORDER — TRAMADOL HCL 50 MG PO TABS
25.0000 mg | ORAL_TABLET | Freq: Four times a day (QID) | ORAL | Status: DC | PRN
Start: 2021-08-04 — End: 2021-08-10
  Administered 2021-08-04 – 2021-08-09 (×6): 25 mg via ORAL
  Filled 2021-08-04 (×6): qty 1

## 2021-08-04 NOTE — Progress Notes (Signed)
    OVERNIGHT PROGRESS REPORT  Notified by RN for patient request to reduce his dose of Tramadol.   Gershon Cull MSNA MSN ACNPC-AG Acute Care Nurse Practitioner [place]

## 2021-08-04 NOTE — Progress Notes (Signed)
PROGRESS NOTE  [name] [name] [id number] DOB: Apr 03, 1951 DOA: 07/17/2021 PCP: Raeanne Gathers, MD   LOS: 17 days   Brief Narrative / Interim history: 70 year old gentleman prior history of hypertension, diabetes, chronic systolic heart failure, stage IIIa CKD, paroxysmal atrial fibrillation, coronary artery disease presents with abdominal pain and diarrhea patient was found to have acute cholecystitis with choledocholithiasis and mild pancreatitis.  He was started on broad-spectrum antibiotics and general surgery, GI consulted.  In view of increased surgical risk patient did not undergo a cholecystectomy.  He was treated conservatively with antibiotics.  Hospital course complicated by diarrhea which was found to be C. difficile diarrhea, GI bleed probably secondary to Eliquis.  Patient received Kcentra for Eliquis reversal, GI consulted underwent EGD found to have a bleeding vessel at the sphincterotomy site which was endoclipped on 07/24/2021.  He was transferred to ICU for hemorrhagic shock briefly requiring Levophed.  He is currently off all pressors. Over the last one week, we have watched his hemoglobin slowly drop from 9.6 to 8.4,. Eliquis was started on 08/01/21  Subjective / 24h Interval events: No complaints, no chest pain, no shortness of breath. No abdominal pain, nausea/vomiting  Assessment & Plan: Principal Problem Acute cholecystitis/choledocholithiasis/pain biliary pancreatitis -gastroenterology and general surgery consulted on admission.  S/p ERCP showing choledocholithiasis underwent stone removal with sphincterotomy.  Cholecystectomy was scheduled however it was canceled as cardiology could not clear him for surgery.  General surgery has signed off and recommended outpatient follow-up in the office.  Patient was treated empirically with antibiotics and finished a course while hospitalized. Pt reports occasional intermittent abd pain, resolves spontaneously, and repeat CT scan  showed extensive pneumobilia is noted in the left hepatic ducts as well as in the common bile and cystic ducts. Large amount of air is also noted in the gallbladder lumen. This most likely is due to recent instrumentation.  -due to LFT elevation on 9/14 GI was reconsulted, MRI/MRCP showing mild debris in the CBD, possible 4 mm stone. 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 Cardiology on board and has ordered a PYP scan as outpatient to evaluate for amyloid, SPEP, UPEP and light chains have been ordered. Recommend outpatient follow up. He was initially started on IV Lasix for fluid overload but has been discontinued due to worsening renal parameters.  His renal function has stabilized now. He is starting to have mild LE swelling. Will give Lasix 40 mg po today and monitor renal function   Mild thrombocytopenia -improved  Lower GI bleed in the setting of C. difficile colitis, hemorrhagic shock-Patient was on Eliquis for anticoagulation for atrial fibrillation which was reversed with Kcentra when he started having GI bleed.  GI consulted patient underwent EGD was found to have a bleeding all vessel at the sphincterotomy site underwent Endo clipping of the bleeding vessel.  Patient was transferred to ICU for hemorrhagic shock and was on Levophed briefly.  Patient currently off all pressors and hemoglobin stable between 8 and 9. No further bleeding seen.    C. difficile diarrhea/ COLITIS-Resolved. Completed the course of oral vancomycin.   DM2 -Hemoglobin A1c is 6.6%.No changes in meds.   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    Paroxysmal atrial fibrillation -Rate  controlled and started the patient on eliquis.    Coronary artery disease s/p NSTEMI in 2008 s/p stent to RCA and LAD Continue with statin at this time  Mild acute encephalopathy - Probably due to acute illness. Pt appears to be at baseline. MRI brain is negative for acute stroke.   Failure to thrive, poor oral intake-Therapy evaluations are recommending SNF  Scheduled Meds:  Chlorhexidine Gluconate Cloth  6 each Topical Daily   furosemide  40 mg Oral Once   insulin aspart  0-9 Units Subcutaneous TID WC   mouth rinse  15 mL Mouth Rinse BID   melatonin  3 mg Oral QHS   pantoprazole  40 mg Oral BID   rosuvastatin  40 mg Oral QHS   Continuous Infusions:  sodium chloride     PRN Meds:.acetaminophen **OR** acetaminophen, alum & mag hydroxide-simeth, naphazoline-glycerin, ondansetron (ZOFRAN) IV, traMADol, Zinc Oxide  Diet Orders (From admission, onward)     Start     Ordered   08/05/21 0001  Diet NPO time specified  Diet effective midnight        08/04/21 0734   07/27/21 1404  Diet regular Room service appropriate? Yes; Fluid consistency: Thin  Diet effective now       Question Answer Comment  Room service appropriate? Yes   Fluid consistency: Thin      07/27/21 1403            DVT prophylaxis: SCDs Start: 07/18/21 0143     Code Status: DNR  Family Communication: Son over the phone  Status is: Inpatient  Remains inpatient appropriate because:Inpatient level of care appropriate due to severity of illness  Dispo: The patient is from: SNF              Anticipated d/c is to: SNF              Patient currently is not medically stable to d/c.   Difficult to place patient No   Level of care: Telemetry  Consultants:  GI PCCM  Procedures:  ERCP EGD  Microbiology  none  Antimicrobials: None currently    Objective: Vitals:   08/03/21 0458 08/03/21 1407 08/03/21 2059 08/04/21 0523  BP: 100/67 (!) 94/57 (!) 92/56 102/68  Pulse: 63 73 73 (!) 55  Resp: '14 20 14 14  '$ Temp: 97.9 F (36.6 C) 97.8 F (36.6  C) 97.7 F (36.5 C) 97.6 F (36.4 C)  TempSrc: Oral Oral Oral Oral  SpO2: 98% 100% 99% 99%  Weight: 94.6 kg   94.9 kg  Height:        Intake/Output Summary (Last 24 hours) at 08/04/2021 1218 Last data filed at 08/04/2021 0933 Gross per 24 hour  Intake 240 ml  Output 900 ml  Net -660 ml    Filed Weights   08/02/21 0730 08/03/21 0458 08/04/21 0523  Weight: 94.2 kg 94.6 kg 94.9 kg    Examination:  Constitutional: nad Eyes: anicteric ENMT: mmm Neck: normal, supple Respiratory: cta biL, no wheezing Cardiovascular: rrr, no mrg, trace edema Abdomen: soft, nt, nd, bs+ Musculoskeletal: no clubbing / cyanosis.  Skin: no new rashes Neurologic: no focal deficits   Data Reviewed: I have independently reviewed following labs and imaging studies  CBC: Recent Labs  Lab 07/31/21 0607 08/01/21 0909 08/02/21 0444 08/03/21 0505 08/04/21 0532  WBC 8.0 8.8 8.6 8.8 7.3  NEUTROABS  --  7.2  --   --   --   HGB 8.0* 8.4* 8.4* 8.6* 8.6*  HCT 25.3* 26.9* 26.7* 26.9* 27.8*  MCV 83.5 84.3 83.2 81.0 81.8  PLT 161 143* 143* 143* 124*    Basic Metabolic Panel: Recent Labs  Lab 07/30/21 0919 07/31/21 0607 08/01/21 0909 08/03/21 0505 08/04/21 0532  NA 136 135 130* 136 132*  K 4.4 4.3 3.8 4.0 3.9  CL 102 102 97* 104 99  CO2 '27 26 26 25 25  '$ GLUCOSE 152* 160* 203* 177* 149*  BUN 33* 30* 26* 25* 25*  CREATININE 1.93* 1.88* 1.87* 1.72* 1.79*  CALCIUM 8.6* 8.5* 8.1* 8.4* 8.1*    Liver Function Tests: Recent Labs  Lab 07/29/21 0538 07/30/21 0919 08/01/21 0909 08/03/21 0505 08/04/21 0532  AST 45* 53* 123* 289* 279*  ALT '24 23 31 '$ 64* 74*  ALKPHOS 302* 285* 241* 231* 207*  BILITOT 2.1* 1.7* 1.7* 1.5* 1.3*  PROT 5.3* 5.7* 5.5* 5.1* 5.1*  ALBUMIN 2.4* 2.5* 2.4* 2.3* 2.3*    Coagulation Profile: No results for input(s): INR, PROTIME in the last 168 hours. HbA1C: No results for input(s): HGBA1C in the last 72 hours. CBG: Recent Labs  Lab 08/03/21 1144 08/03/21 1638  08/03/21 2101 08/04/21 0738 08/04/21 1141  GLUCAP 191* 163* 191* 133* 129*     Recent Results (from the past 240 hour(s))  Resp Panel by RT-PCR (Flu A&B, Covid) Nasopharyngeal Swab     Status: None   Collection Time: 08/01/21  9:52 AM   Specimen: Nasopharyngeal Swab; Nasopharyngeal(NP) swabs in vial transport medium  Result Value Ref Range Status   SARS Coronavirus 2 by RT PCR NEGATIVE NEGATIVE Final    Comment: (NOTE) SARS-CoV-2 target nucleic acids are NOT DETECTED.  The SARS-CoV-2 RNA is generally detectable in upper respiratory specimens during the acute phase of infection. The lowest concentration of SARS-CoV-2 viral copies this assay can detect is 138 copies/mL. A negative result does not preclude SARS-Cov-2 infection and should not be used as the sole basis for treatment or other patient management decisions. A negative result may occur with  improper specimen collection/handling, submission of specimen other than nasopharyngeal swab, presence of viral mutation(s) within the areas targeted by this assay, and inadequate number of viral copies(<138 copies/mL). A negative result must be combined with clinical observations, patient history, and epidemiological information. The expected result is Negative.  Fact Sheet for Patients:  EntrepreneurPulse.com.au  Fact Sheet for Healthcare Providers:  IncredibleEmployment.be  This test is no t yet approved or cleared by the [place] FDA and  has been authorized for detection and/or diagnosis of SARS-CoV-2 by FDA under an Emergency Use Authorization (EUA). This EUA will remain  in effect (meaning this test can be used) for the duration of the COVID-19 declaration under Section 564(b)(1) of the Act, 21 U.S.C.section 360bbb-3(b)(1), unless the authorization is terminated  or revoked sooner.       Influenza A by PCR NEGATIVE NEGATIVE Final   Influenza B by PCR NEGATIVE NEGATIVE Final     Comment: (NOTE) The Xpert Xpress SARS-CoV-2/FLU/RSV plus assay is intended as an aid in the diagnosis of influenza from Nasopharyngeal swab specimens and should not be used as a sole basis for treatment. Nasal washings and aspirates are unacceptable for Xpert Xpress SARS-CoV-2/FLU/RSV testing.  Fact Sheet for Patients: EntrepreneurPulse.com.au  Fact Sheet for Healthcare Providers: IncredibleEmployment.be  This test is not yet approved or cleared by the [place] FDA and has been authorized for detection and/or diagnosis of SARS-CoV-2 by FDA under an Emergency Use Authorization (EUA). This EUA will remain in effect (  meaning this test can be used) for the duration of the COVID-19 declaration under Section 564(b)(1) of the Act, 21 U.S.C. section 360bbb-3(b)(1), unless the authorization is terminated or revoked.  Performed at [facility], [place] [address], [place], [place] [address]       Radiology Studies: MR 3D Recon At Scanner  Result Date: 08/03/2021 CLINICAL DATA:  Abdominal pain, evaluate for cholelithiasis EXAM: MRI ABDOMEN WITHOUT AND WITH CONTRAST (INCLUDING MRCP) TECHNIQUE: Multiplanar multisequence MR imaging of the abdomen was performed both before and after the administration of intravenous contrast. Heavily T2-weighted images of the biliary and pancreatic ducts were obtained, and three-dimensional MRCP images were rendered by post processing. CONTRAST:  44m GADAVIST GADOBUTROL 1 MMOL/ML IV SOLN COMPARISON:  CT abdomen/pelvis dated 07/28/2021 FINDINGS: Blooming/susceptibility artifact in the right mid abdomen related to a surgical clip in the duodenum at the ampulla on CT. Lower chest: Moderate left and small right pleural effusions. Associated lower lobe atelectasis. Hepatobiliary: Liver is within normal limits. No suspicious/enhancing hepatic lesions. Mild gallbladder wall thickening, particularly in the gallbladder  neck (series 4/image 21). Intraluminal gas related to recent sphincterotomy (series 4/image 19). No pericholecystic inflammatory changes. No intrahepatic ductal dilatation. Common duct measures 7 mm. Mild debris is suspected in the common duct, including a possible 4 mm distal CBD stone (series 6/image 15), although equivocal. Pancreas: Partially obscured by artifact but otherwise within normal limits. Spleen:  Within normal limits. Adrenals/Urinary Tract:  Adrenal glands are within normal limits. Right kidney is partially obscured by artifact but otherwise within normal limits. Left kidney is within normal limits. No hydronephrosis. Stomach/Bowel: Stomach is within normal limits. Visualized bowel is grossly unremarkable, noting artifact obscuring the proximal duodenum. Vascular/Lymphatic:  No evidence of abdominal aortic aneurysm. No suspicious abdominal lymphadenopathy. Other:  No abdominal ascites. Musculoskeletal: No focal osseous lesions. IMPRESSION: Blooming/subpleural artifact in the right mid abdomen related to a surgical clip in the duodenum at the ampulla. Common duct measures 7 mm. Mild debris is suspected in the common duct, including a possible 4 mm distal CBD stone, although equivocal. Mild gallbladder wall thickening, although without pericholecystic inflammatory changes. Intraluminal gas, likely related to recent sphincterotomy. Moderate left and small right pleural effusions. Electronically Signed   By: SJulian HyM.D.   On: 08/03/2021 20:53   MR ABDOMEN MRCP W WO CONTAST  Result Date: 08/03/2021 CLINICAL DATA:  Abdominal pain, evaluate for cholelithiasis EXAM: MRI ABDOMEN WITHOUT AND WITH CONTRAST (INCLUDING MRCP) TECHNIQUE: Multiplanar multisequence MR imaging of the abdomen was performed both before and after the administration of intravenous contrast. Heavily T2-weighted images of the biliary and pancreatic ducts were obtained, and three-dimensional MRCP images were rendered by post  processing. CONTRAST:  135mGADAVIST GADOBUTROL 1 MMOL/ML IV SOLN COMPARISON:  CT abdomen/pelvis dated 07/28/2021 FINDINGS: Blooming/susceptibility artifact in the right mid abdomen related to a surgical clip in the duodenum at the ampulla on CT. Lower chest: Moderate left and small right pleural effusions. Associated lower lobe atelectasis. Hepatobiliary: Liver is within normal limits. No suspicious/enhancing hepatic lesions. Mild gallbladder wall thickening, particularly in the gallbladder neck (series 4/image 21). Intraluminal gas related to recent sphincterotomy (series 4/image 19). No pericholecystic inflammatory changes. No intrahepatic ductal dilatation. Common duct measures 7 mm. Mild debris is suspected in the common duct, including a possible 4 mm distal CBD stone (series 6/image 15), although equivocal. Pancreas: Partially obscured by artifact but otherwise within normal limits. Spleen:  Within normal limits. Adrenals/Urinary Tract:  Adrenal glands are within normal  limits. Right kidney is partially obscured by artifact but otherwise within normal limits. Left kidney is within normal limits. No hydronephrosis. Stomach/Bowel: Stomach is within normal limits. Visualized bowel is grossly unremarkable, noting artifact obscuring the proximal duodenum. Vascular/Lymphatic:  No evidence of abdominal aortic aneurysm. No suspicious abdominal lymphadenopathy. Other:  No abdominal ascites. Musculoskeletal: No focal osseous lesions. IMPRESSION: Blooming/subpleural artifact in the right mid abdomen related to a surgical clip in the duodenum at the ampulla. Common duct measures 7 mm. Mild debris is suspected in the common duct, including a possible 4 mm distal CBD stone, although equivocal. Mild gallbladder wall thickening, although without pericholecystic inflammatory changes. Intraluminal gas, likely related to recent sphincterotomy. Moderate left and small right pleural effusions. Electronically Signed   By: Julian Hy M.D.   On: 08/03/2021 20:53     Marzetta Board, MD, PhD [facility]  Between 7 am - 7 pm I am available, please contact me via Amion (for emergencies) or Securechat (non urgent messages)  Between 7 pm - 7 am I am not available, please contact night coverage MD/APP via Amion

## 2021-08-05 ENCOUNTER — Inpatient Hospital Stay (HOSPITAL_COMMUNITY): Payer: Medicare Other

## 2021-08-05 ENCOUNTER — Encounter (HOSPITAL_COMMUNITY): Payer: Self-pay | Admitting: Family Medicine

## 2021-08-05 ENCOUNTER — Encounter (HOSPITAL_COMMUNITY)
Admission: EM | Disposition: A | Discharge status: Skilled Nursing Facility | Payer: Self-pay | Source: Home / Self Care | Attending: Internal Medicine

## 2021-08-05 ENCOUNTER — Inpatient Hospital Stay (HOSPITAL_COMMUNITY): Payer: Medicare Other | Admitting: Anesthesiology

## 2021-08-05 DIAGNOSIS — K8080 Other cholelithiasis without obstruction: Secondary | ICD-10-CM | POA: Diagnosis not present

## 2021-08-05 DIAGNOSIS — K810 Acute cholecystitis: Secondary | ICD-10-CM | POA: Diagnosis not present

## 2021-08-05 DIAGNOSIS — K8050 Calculus of bile duct without cholangitis or cholecystitis without obstruction: Secondary | ICD-10-CM | POA: Diagnosis not present

## 2021-08-05 HISTORY — PX: ERCP: SHX5425

## 2021-08-05 HISTORY — PX: REMOVAL OF STONES: SHX5545

## 2021-08-05 LAB — COMPREHENSIVE METABOLIC PANEL
ALT: 78 U/L — ABNORMAL HIGH (ref 0–44)
AST: 190 U/L — ABNORMAL HIGH (ref 15–41)
Albumin: 2.4 g/dL — ABNORMAL LOW (ref 3.5–5.0)
Alkaline Phosphatase: 218 U/L — ABNORMAL HIGH (ref 38–126)
Anion gap: 5 (ref 5–15)
BUN: 28 mg/dL — ABNORMAL HIGH (ref 8–23)
CO2: 28 mmol/L (ref 22–32)
Calcium: 8.2 mg/dL — ABNORMAL LOW (ref 8.9–10.3)
Chloride: 100 mmol/L (ref 98–111)
Creatinine, Ser: 1.79 mg/dL — ABNORMAL HIGH (ref 0.61–1.24)
GFR, Estimated: 41 mL/min — ABNORMAL LOW (ref >60)
Glucose, Bld: 209 mg/dL — ABNORMAL HIGH (ref 70–99)
Potassium: 3.9 mmol/L (ref 3.5–5.1)
Sodium: 133 mmol/L — ABNORMAL LOW (ref 135–145)
Total Bilirubin: 1.1 mg/dL (ref 0.3–1.2)
Total Protein: 5.2 g/dL — ABNORMAL LOW (ref 6.5–8.1)

## 2021-08-05 LAB — GLUCOSE, CAPILLARY
Glucose-Capillary: 169 mg/dL — ABNORMAL HIGH (ref 70–99)
Glucose-Capillary: 132 mg/dL — ABNORMAL HIGH (ref 70–99)
Glucose-Capillary: 126 mg/dL — ABNORMAL HIGH (ref 70–99)
Glucose-Capillary: 130 mg/dL — ABNORMAL HIGH (ref 70–99)
Glucose-Capillary: 230 mg/dL — ABNORMAL HIGH (ref 70–99)

## 2021-08-05 LAB — CBC
HCT: 27.4 % — ABNORMAL LOW (ref 39.0–52.0)
Hemoglobin: 8.4 g/dL — ABNORMAL LOW (ref 13.0–17.0)
MCH: 25.2 pg — ABNORMAL LOW (ref 26.0–34.0)
MCHC: 30.7 g/dL (ref 30.0–36.0)
MCV: 82.3 fL (ref 80.0–100.0)
Platelets: 124 10*3/uL — ABNORMAL LOW (ref 150–400)
RBC: 3.33 MIL/uL — ABNORMAL LOW (ref 4.22–5.81)
RDW: 20.9 % — ABNORMAL HIGH (ref 11.5–15.5)
WBC: 7.2 10*3/uL (ref 4.0–10.5)
nRBC: 0 % (ref 0.0–0.2)

## 2021-08-05 SURGERY — ERCP, WITH INTERVENTION IF INDICATED
Anesthesia: General

## 2021-08-05 MED ORDER — SODIUM CHLORIDE 0.9 % IV SOLN: INTRAVENOUS | Status: DC

## 2021-08-05 MED ORDER — GLUCAGON HCL RDNA (DIAGNOSTIC) 1 MG IJ SOLR
INTRAMUSCULAR | Status: AC
  Filled 2021-08-05: qty 1

## 2021-08-05 MED ORDER — CIPROFLOXACIN IN D5W 400 MG/200ML IV SOLN
INTRAVENOUS | Status: AC
  Filled 2021-08-05: qty 200

## 2021-08-05 MED ORDER — PHENYLEPHRINE HCL (PRESSORS) 10 MG/ML IV SOLN
INTRAVENOUS | Status: AC
  Filled 2021-08-05: qty 2

## 2021-08-05 MED ORDER — PHENYLEPHRINE 40 MCG/ML (10ML) SYRINGE FOR IV PUSH (FOR BLOOD PRESSURE SUPPORT)
PREFILLED_SYRINGE | INTRAVENOUS | Status: DC | PRN
  Administered 2021-08-05: 120 ug via INTRAVENOUS

## 2021-08-05 MED ORDER — ETOMIDATE 2 MG/ML IV SOLN
INTRAVENOUS | Status: DC | PRN
  Administered 2021-08-05: 14 mg via INTRAVENOUS
  Administered 2021-08-05: 4 mg via INTRAVENOUS

## 2021-08-05 MED ORDER — FENTANYL CITRATE (PF) 100 MCG/2ML IJ SOLN
INTRAMUSCULAR | Status: DC | PRN
  Administered 2021-08-05: 50 ug via INTRAVENOUS

## 2021-08-05 MED ORDER — ROCURONIUM BROMIDE 10 MG/ML (PF) SYRINGE
PREFILLED_SYRINGE | INTRAVENOUS | Status: DC | PRN
  Administered 2021-08-05: 40 mg via INTRAVENOUS

## 2021-08-05 MED ORDER — INDOMETHACIN 50 MG RE SUPP
RECTAL | Status: AC
  Filled 2021-08-05: qty 2

## 2021-08-05 MED ORDER — FENTANYL CITRATE (PF) 100 MCG/2ML IJ SOLN
INTRAMUSCULAR | Status: AC
  Filled 2021-08-05: qty 2

## 2021-08-05 MED ORDER — SUGAMMADEX SODIUM 200 MG/2ML IV SOLN
INTRAVENOUS | Status: DC | PRN
  Administered 2021-08-05: 200 mg via INTRAVENOUS

## 2021-08-05 MED ORDER — APIXABAN 5 MG PO TABS
5.0000 mg | ORAL_TABLET | Freq: Two times a day (BID) | ORAL | Status: DC
  Administered 2021-08-05 – 2021-08-09 (×9): 5 mg via ORAL
  Filled 2021-08-05 (×9): qty 1

## 2021-08-05 MED ORDER — LIDOCAINE 2% (20 MG/ML) 5 ML SYRINGE
INTRAMUSCULAR | Status: DC | PRN
  Administered 2021-08-05: 60 mg via INTRAVENOUS

## 2021-08-05 MED ORDER — LACTATED RINGERS IV SOLN
INTRAVENOUS | Status: AC | PRN
  Administered 2021-08-05: 1000 mL via INTRAVENOUS

## 2021-08-05 MED ORDER — ONDANSETRON HCL 4 MG/2ML IJ SOLN
INTRAMUSCULAR | Status: DC | PRN
  Administered 2021-08-05: 4 mg via INTRAVENOUS

## 2021-08-05 MED ORDER — PROPOFOL 10 MG/ML IV BOLUS
INTRAVENOUS | Status: AC
  Filled 2021-08-05: qty 20

## 2021-08-05 MED ORDER — SODIUM CHLORIDE 0.9 % IV SOLN
INTRAVENOUS | Status: DC | PRN
  Administered 2021-08-05: 20 mL

## 2021-08-05 MED ORDER — SODIUM CHLORIDE 0.9 % IV SOLN
1.5000 g | Freq: Once | INTRAVENOUS | Status: AC
  Administered 2021-08-05: 1.5 g via INTRAVENOUS
  Filled 2021-08-05 (×2): qty 4

## 2021-08-05 NOTE — TOC Progression Note (Addendum)
Transition of Care [facility]) - Progression Note    Patient Details  Name: FON RIJO MRN: [id number] Date of Birth: [birth year]  Transition of [facility]) CM/SW Contact  Wendie Diskin, Marjie Skiff, RN Phone Number: 08/05/2021, 9:58 AM  Clinical Narrative:     New insurance auth for SNF was done with [facility] as previous auth expired. [phone]). Auth good 9/14-9/16. Plan is still for pt go to [facility].  Expected Discharge Plan: Skilled Nursing Facility Barriers to Discharge: Continued Medical Work up  Expected Discharge Plan and Services Expected Discharge Plan: [place] arrangements for the past 2 months: Single Family Home Expected Discharge Date: 08/03/21                       Readmission Risk Interventions Readmission Risk Prevention Plan 07/27/2021  Transportation Screening Complete  PCP or Specialist Appt within 3-5 Days Complete  HRI or Home Care Consult Complete  Social Work Consult for [place] Planning/Counseling Complete  Palliative Care Screening Complete  Medication Review Press photographer) Complete  Some recent data might be hidden

## 2021-08-05 NOTE — Op Note (Signed)
[facility] Patient Name: [name] [name] Procedure Date: 08/05/2021 MRN: [id number] Attending MD: Carol Ada , MD Date of Birth: 1951-09-17 CSN: [id number] Age: 70 Admit Type: Inpatient Procedure:                ERCP Indications:              Common bile duct stone(s) Providers:                Carol Ada, MD, Doristine Johns, RN, Tyna Jaksch Technician Referring MD:              Medicines:                General Anesthesia Complications:            No immediate complications. Estimated Blood Loss:     Estimated blood loss: none. Procedure:                Pre-Anesthesia Assessment:                           - Prior to the procedure, a History and Physical                            was performed, and patient medications and                            allergies were reviewed. The patient's tolerance of                            previous anesthesia was also reviewed. The risks                            and benefits of the procedure and the sedation                            options and risks were discussed with the patient.                            All questions were answered, and informed consent                            was obtained. Prior Anticoagulants: The patient has                            taken Eliquis (apixaban), last dose was 1 day prior                            to procedure. ASA Grade Assessment: III - A patient                            with severe systemic disease. After reviewing the  risks and benefits, the patient was deemed in                            satisfactory condition to undergo the procedure.                           - Sedation was administered by an anesthesia                            professional. General anesthesia was attained.                           After obtaining informed consent, the scope was                            passed under direct vision. Throughout  the                            procedure, the patient's blood pressure, pulse, and                            oxygen saturations were monitored continuously. The                            [id number] Olympus duodenoscope was                            introduced through the mouth, and used to inject                            contrast into and used to inject contrast into the                            bile duct. The ERCP was accomplished without                            difficulty. The patient tolerated the procedure                            well. Scope In: Scope Out: Findings:      A short 0.035 inch Soft Jagwire was passed into the biliary tree. The       biliary tree was swept with a 12 mm balloon starting at the bifurcation.       Nothing was found.      The CBD was easily cannulated as there was a large sphincterotomy. A       hemoclip from the prior sphincterotomy bleed was identified. The CBD was       noted to be 8-10 mm in diameter with contrast injection. Air bubble       filling defects were found. The CBD was swept three times and not stones       or sludge was extracted. The final occlusion cholangiogram was performed       and there was no evidence of any filling defects. The recent MRCP  findings appear to be a false positive. The elevation in the liver       enzymes are felt to be from his CHF. If any stones or sludge from the       gallbladder pass into the CBD, the large sphincterotomy should be able       to allow for spontaneous passage of small stones and sludge. Impression:               - The biliary tree was swept and nothing was found. Moderate Sedation:      Not Applicable - Patient had care per Anesthesia. Recommendation:           - Return patient to hospital ward for ongoing care.                           - No further biliary intervention is required. Procedure Code(s):        --- Professional ---                           [phone],  Endoscopic retrograde                            cholangiopancreatography (ERCP); diagnostic,                            including collection of specimen(s) by brushing or                            washing, when performed (separate procedure) Diagnosis Code(s):        --- Professional ---                           K80.50, Calculus of bile duct without cholangitis                            or cholecystitis without obstruction CPT copyright 2019 American Medical Association. All rights reserved. The codes documented in this report are preliminary and upon coder review may  be revised to meet current compliance requirements. Carol Ada, MD Carol Ada, MD 08/05/2021 2:53:26 PM This report has been signed electronically. Number of Addenda: 0

## 2021-08-05 NOTE — TOC Progression Note (Signed)
Transition of Care [facility]) - Progression Note    Patient Details  Name: [name] [name] MRN: [id number] Date of Birth: 19-Oct-1951  Transition [facility]) CM/SW Contact  Hewitt Garner, Marjie Skiff, RN Phone Number: 08/05/2021, 10:02 AM  Clinical Narrative:    Current SNF auth through [place] today. Pt for ERCP today and will not be ready to dc today. New auth will need to be started closer to dc. Plan is still for pt to go to [facility].    Expected Discharge Plan: Skilled Nursing Facility Barriers to Discharge: Continued Medical Work up  Expected Discharge Plan and Services Expected Discharge Plan: [place] arrangements for the past 2 months: Single Family Home Expected Discharge Date: 08/03/21                   Readmission Risk Interventions Readmission Risk Prevention Plan 07/27/2021  Transportation Screening Complete  PCP or Specialist Appt within 3-5 Days Complete  HRI or Home Care Consult Complete  Social Work Consult for [place] Planning/Counseling Complete  Palliative Care Screening Complete  Medication Review Press photographer) Complete  Some recent data might be hidden

## 2021-08-05 NOTE — Plan of Care (Signed)
70/M admitted 07/17/21 with a diagnosis of "acute choleycystitis". Patient is now s/p ICU stay. NPO this AM for ERCP today. CC this AM is his NPO status; patient provided with education about the reasons for his nil per os status.    Problem: Education: Goal: Knowledge of General Education information will improve Description: Including pain rating scale, medication(s)/side effects and non-pharmacologic comfort measures Outcome: Progressing   Problem: Health Behavior/Discharge Planning: Goal: Ability to manage health-related needs will improve Outcome: Progressing   Problem: Clinical Measurements: Goal: Ability to maintain clinical measurements within normal limits will improve Outcome: Progressing Goal: Will remain free from infection Outcome: Progressing Goal: Diagnostic test results will improve Outcome: Progressing Goal: Respiratory complications will improve Outcome: Progressing Goal: Cardiovascular complication will be avoided Outcome: Progressing   Problem: Activity: Goal: Risk for activity intolerance will decrease Outcome: Progressing   Problem: Nutrition: Goal: Adequate nutrition will be maintained Outcome: Progressing   Problem: Coping: Goal: Level of anxiety will decrease Outcome: Progressing   Problem: Elimination: Goal: Will not experience complications related to bowel motility Outcome: Progressing Goal: Will not experience complications related to urinary retention Outcome: Progressing   Problem: Pain Managment: Goal: General experience of comfort will improve Outcome: Progressing   Problem: Safety: Goal: Ability to remain free from injury will improve Outcome: Progressing   Problem: Skin Integrity: Goal: Risk for impaired skin integrity will decrease Outcome: Progressing

## 2021-08-05 NOTE — Progress Notes (Signed)
Occupational Therapy Treatment Patient Details Name: MICHAIL GARTEN MRN: [id number] DOB: Jun 27, 1951 Today's Date: 08/05/2021   History of present illness 70 year old male with history of diabetes mellitus type 2, chronic systolic CHF, CKD stage III, paroxysmal atrial fibrillation, CAD, hypertension, osteoarthritis, patient presented with abdominal pain and diarrhea.  On presentation was found to have acute cholecystitis with choledocholithiasis and mild pancreatitis along with leukocytosis elevated LFTs., + C-Diff.   OT comments  Patient joking around with therapists this session, not flat affect and is oriented x4. Does still have some difficulty with carry over of directions "what are we doing" and needs cues to sequence transfer to/from recliner chair as well as for hand placement not to pull on walker to stand. Patient does endorse dizziness with getting out of bed however blood pressures remain stable. Min x2 for functional transfers this session, ultimately got back to bed in anticipation of going for procedure soon and having to go in the bed. Acute OT to follow.   Recommendations for follow up therapy are one component of a multi-disciplinary discharge planning process, led by the attending physician.  Recommendations may be updated based on patient status, additional functional criteria and insurance authorization.    Follow Up Recommendations  SNF    Equipment Recommendations  Tub/shower seat       Precautions / Restrictions Precautions Precautions: Fall Precaution Comments: c diff, frequent stool, not orthostatic 9/16       Mobility Bed Mobility Overal bed mobility: Needs Assistance Bed Mobility: Rolling;Sidelying to Sit;Sit to Sidelying Rolling: Supervision Sidelying to sit: Min guard     Sit to sidelying: Mod assist General bed mobility comments: modA to get legs back onto bed, did much better getting out of bed this session needing cues only for sequencing     Transfers Overall transfer level: Needs assistance Equipment used: Rolling walker (2 wheeled) Transfers: Sit to/from [facility] Sit to Stand: Min assist;+2 physical assistance;+2 safety/equipment;From elevated surface Stand pivot transfers: Min assist;+2 physical assistance;+2 safety/equipment       General transfer comment: to recliner chair and then back to bed (going for procedure) needs cues for hand placement and to utilize momentum. Initial flexed posture over walker needing cues to upright. Needs min x2 to take small shuffled steps to/from recliner and or walker management. fall risk    Balance Overall balance assessment: Needs assistance Sitting-balance support: Feet supported Sitting balance-Leahy Scale: Fair     Standing balance support: Bilateral upper extremity supported;During functional activity Standing balance-Leahy Scale: Poor Standing balance comment: reliant on UE support                           ADL either performed or assessed with clinical judgement   ADL Overall ADL's : Needs assistance/impaired                         Toilet Transfer: Minimal assistance;+2 for physical assistance;+2 for safety/equipment;Stand-pivot;Cueing for safety;Cueing for sequencing;RW Toilet Transfer Details (indicate cue type and reason): to recliner chair at back. needs cues for hand placement and to use momentum to power up to standing. has initial flexed posture needing cues to push through arms to stand upright. unsteady taking small shuffled steps and needing assist managing walker         Functional mobility during ADLs: Minimal assistance;+2 for physical assistance;+2 for safety/equipment;Rolling walker;Cueing for sequencing;Cueing for safety  Cognition Arousal/Alertness: Awake/alert Behavior During Therapy: WFL for tasks assessed/performed Overall Cognitive Status: No family/caregiver present to determine baseline  cognitive functioning Area of Impairment: Following commands                       Following Commands: Follows one step commands with increased time       General Comments: patient joking around with therapists today, not nearly as flat as previous session with this OT. oriented x4 however does still take increased time with directions at times "so what are we doing?"                   Pertinent Vitals/ Pain       Pain Assessment: Faces Faces Pain Scale: Hurts little more Pain Location: buttock Pain Descriptors / Indicators: Grimacing Pain Intervention(s): Monitored during session         Frequency  Min 2X/week        Progress Toward Goals  OT Goals(current goals can now be found in the care plan section)  Progress towards OT goals: Progressing toward goals  Acute Rehab OT Goals Patient Stated Goal: I want to get stronger and get out of this bed. OT Goal Formulation: With patient Time For Goal Achievement: 08/12/21 Potential to Achieve Goals: Good ADL Goals Pt Will Perform Lower Body Dressing: with supervision;sit to/from stand;sitting/lateral leans (A/E as needed) Pt Will Transfer to Toilet: with min guard assist;ambulating;bedside commode (walker) Pt Will Perform Toileting - Clothing Manipulation and hygiene: with min guard assist;sit to/from stand;sitting/lateral leans Additional ADL Goal #1: Patient will follow multistep directions with less than 25% cues in order to participate in daily routine safely.  Plan Discharge plan remains appropriate    Co-evaluation    PT/OT/SLP Co-Evaluation/Treatment: Yes Reason for Co-Treatment: To address functional/ADL transfers PT goals addressed during session: Mobility/safety with mobility OT goals addressed during session: ADL's and self-care      AM-PAC OT "6 Clicks" Daily Activity     Outcome Measure   Help from another person eating meals?: None Help from another person taking care of personal  grooming?: A Little Help from another person toileting, which includes using toliet, bedpan, or urinal?: A Lot Help from another person bathing (including washing, rinsing, drying)?: A Lot Help from another person to put on and taking off regular upper body clothing?: A Little Help from another person to put on and taking off regular lower body clothing?: A Lot 6 Click Score: 16    End of Session Equipment Utilized During Treatment: Rolling walker;Gait belt  OT Visit Diagnosis: Unsteadiness on feet (R26.81);Other abnormalities of gait and mobility (R26.89)   Activity Tolerance Patient tolerated treatment well   Patient Left in bed;with call bell/phone within reach;with bed alarm set   Nurse Communication Mobility status        Time: [id number] OT Time Calculation (min): 23 min  Charges: OT General Charges $OT Visit: 1 Visit OT Treatments $Self Care/Home Management : 8-22 mins  Delbert Phenix OT OT pager: [phone]   Rosemary Holms 08/05/2021, 1:09 PM

## 2021-08-05 NOTE — Progress Notes (Signed)
Physical Therapy Treatment Patient Details Name: [name] [name] MRN: [id number] DOB: July 27, 1951 Today's Date: 08/05/2021   History of Present Illness 70 year old male with history of diabetes mellitus type 2, chronic systolic CHF, CKD stage III, paroxysmal atrial fibrillation, CAD, hypertension, osteoarthritis, patient presented with abdominal pain and diarrhea.  On presentation was found to have acute cholecystitis with choledocholithiasis and mild pancreatitis along with leukocytosis elevated LFTs., + C-Diff.    PT Comments    Patient is much more alert and participatory,  joking with therapists. Patient  min assist to sit up and return to supine . Stood at Rw with min/mod assist of 2 from bed and recliner. Able to take a few shuffle  steps to and from bed. Patient going for procedure so left in bed.   Continue PT.  Recommendations for follow up therapy are one component of a multi-disciplinary discharge planning process, led by the attending physician.  Recommendations may be updated based on patient status, additional functional criteria and insurance authorization.  Follow Up Recommendations  SNF     Equipment Recommendations  None recommended by PT    Recommendations for Other Services       Precautions / Restrictions Precautions Precautions: Fall Precaution Comments: c diff, frequent stool, not orthostatic 9/16     Mobility  Bed Mobility Overal bed mobility: Needs Assistance Bed Mobility: Rolling;Sidelying to Sit;Sit to Sidelying Rolling: Supervision Sidelying to sit: Min guard     Sit to sidelying: Mod assist General bed mobility comments: modA to get legs back onto bed, did much better getting out of bed this session needing cues only for sequencing    Transfers Overall transfer level: Needs assistance Equipment used: Rolling walker (2 wheeled) Transfers: Sit to/from [facility] Sit to Stand: Min assist;+2 physical assistance;+2  safety/equipment;From elevated surface Stand pivot transfers: Min assist;+2 physical assistance;+2 safety/equipment       General transfer comment: to recliner chair and then back to bed (going for procedure) needs cues for hand placement and to utilize momentum. Initial flexed posture over walker needing cues to upright. Needs min x2 to take small shuffled steps to/from recliner and or walker management. fall risk  Ambulation/Gait                 Stairs             Wheelchair Mobility    Modified Rankin (Stroke Patients Only)       Balance Overall balance assessment: Needs assistance Sitting-balance support: Feet supported Sitting balance-Leahy Scale: Fair     Standing balance support: Bilateral upper extremity supported;During functional activity Standing balance-Leahy Scale: Poor Standing balance comment: reliant on UE support                            Cognition Arousal/Alertness: Awake/alert Behavior During Therapy: WFL for tasks assessed/performed Overall Cognitive Status: No family/caregiver present to determine baseline cognitive functioning Area of Impairment: Following commands                       Following Commands: Follows one step commands with increased time     Problem Solving: Slow processing;Decreased initiation;Requires verbal cues;Requires tactile cues General Comments: patient joking around with therapists today, not nearly as flat as previous session with this OT. oriented x4 however does still take increased time with directions at times "so what are we doing?"      Exercises  General Comments        Pertinent Vitals/Pain Pain Assessment: Faces Faces Pain Scale: Hurts little more Pain Location: buttock Pain Descriptors / Indicators: Grimacing Pain Intervention(s): Monitored during session    Home Living                      Prior Function            PT Goals (current goals can now be  found in the care plan section) Acute Rehab PT Goals Patient Stated Goal: I want to get stronger and get out of this bed. Progress towards PT goals: Progressing toward goals    Frequency    Min 2X/week      PT Plan Current plan remains appropriate;Frequency needs to be updated    Co-evaluation PT/OT/SLP Co-Evaluation/Treatment: Yes Reason for Co-Treatment: For patient/therapist safety PT goals addressed during session: Mobility/safety with mobility OT goals addressed during session: ADL's and self-care      AM-PAC PT "6 Clicks" Mobility   Outcome Measure  Help needed turning from your back to your side while in a flat bed without using bedrails?: A Little Help needed moving from lying on your back to sitting on the side of a flat bed without using bedrails?: A Little Help needed moving to and from a bed to a chair (including a wheelchair)?: A Lot Help needed standing up from a chair using your arms (e.g., wheelchair or bedside chair)?: A Lot Help needed to walk in hospital room?: A Lot Help needed climbing 3-5 steps with a railing? : Total 6 Click Score: 13    End of Session Equipment Utilized During Treatment: Gait belt Activity Tolerance: Patient tolerated treatment well Patient left: with call bell/phone within reach;in bed;with bed alarm set Nurse Communication: Mobility status PT Visit Diagnosis: Other abnormalities of gait and mobility (R26.89);Muscle weakness (generalized) (M62.81)     Time: [id number] PT Time Calculation (min) (ACUTE ONLY): 23 min  Charges:  $Therapeutic Activity: 8-22 mins                     Tresa Endo PT Acute Rehabilitation Services Pager [phone] Office [phone]     Claretha Cooper 08/05/2021, 2:11 PM

## 2021-08-05 NOTE — Transfer of Care (Signed)
Immediate Anesthesia Transfer of Care Note  Patient: [name] [name]  Procedure(s) Performed: ENDOSCOPIC RETROGRADE CHOLANGIOPANCREATOGRAPHY (ERCP) REMOVAL OF STONES  Patient Location: Endoscopy Unit  Anesthesia Type:General  Level of Consciousness: awake  Airway & Oxygen Therapy: Patient Spontanous Breathing and Patient connected to face mask oxygen  Post-op Assessment: Report given to RN and Post -op Vital signs reviewed and stable  Post vital signs: Reviewed and stable  Last Vitals:  Vitals Value Taken Time  BP    Temp    Pulse    Resp 0 08/05/21 1503  SpO2    Vitals shown include unvalidated device data.  Last Pain:  Vitals:   08/05/21 1321  TempSrc: Oral  PainSc: 0-No pain      Patients Stated Pain Goal: 1 ([id number])  Complications: No notable events documented.

## 2021-08-05 NOTE — Anesthesia Preprocedure Evaluation (Signed)
Anesthesia Evaluation  Patient identified by MRN, date of birth, ID band Patient awake    Reviewed: Allergy & Precautions, NPO status , Patient's Chart, lab work & pertinent test results  Airway Mallampati: II  TM Distance: >3 FB Neck ROM: Full    Dental   Pulmonary former smoker,    breath sounds clear to auscultation       Cardiovascular hypertension, Pt. on medications + CAD, + Past MI and +CHF   Rhythm:Regular Rate:Normal     Neuro/Psych negative neurological ROS     GI/Hepatic negative GI ROS, Neg liver ROS,   Endo/Other  diabetes, Type 2  Renal/GU CRFRenal disease     Musculoskeletal  (+) Arthritis ,   Abdominal   Peds  Hematology  (+) Blood dyscrasia, anemia ,   Anesthesia Other Findings   Reproductive/Obstetrics                             Anesthesia Physical Anesthesia Plan  ASA: 3  Anesthesia Plan: General   Post-op Pain Management:    Induction: Intravenous  PONV Risk Score and Plan: 2 and Dexamethasone, Ondansetron and Treatment may vary due to age or medical condition  Airway Management Planned: Oral ETT  Additional Equipment:   Intra-op Plan:   Post-operative Plan: Extubation in OR  Informed Consent: I have reviewed the patients History and Physical, chart, labs and discussed the procedure including the risks, benefits and alternatives for the proposed anesthesia with the patient or authorized representative who has indicated his/her understanding and acceptance.     Dental advisory given  Plan Discussed with: CRNA  Anesthesia Plan Comments:         Anesthesia Quick Evaluation

## 2021-08-05 NOTE — Anesthesia Procedure Notes (Signed)
Procedure Name: Intubation Date/Time: 08/05/2021 2:26 PM Performed by: Sharlette Dense, CRNA Pre-anesthesia Checklist: Patient identified, Emergency Drugs available, Suction available and Patient being monitored Patient Re-evaluated:Patient Re-evaluated prior to induction Oxygen Delivery Method: Circle system utilized Preoxygenation: Pre-oxygenation with 100% oxygen Induction Type: IV induction Ventilation: Mask ventilation without difficulty Laryngoscope Size: Miller and 3 Grade View: Grade II Tube type: Oral Tube size: 7.5 mm Number of attempts: 1 Airway Equipment and Method: Stylet Placement Confirmation: ETT inserted through vocal cords under direct vision, positive ETCO2 and breath sounds checked- equal and bilateral Secured at: 22 cm Tube secured with: Tape Dental Injury: Teeth and Oropharynx as per pre-operative assessment

## 2021-08-05 NOTE — Progress Notes (Signed)
PROGRESS NOTE  [name] [name] [id number] DOB: 08-31-1951 DOA: 07/17/2021 PCP: Raeanne Gathers, MD   LOS: 18 days   Brief Narrative / Interim history: 70 year old gentleman prior history of hypertension, diabetes, chronic systolic heart failure, stage IIIa CKD, paroxysmal atrial fibrillation, coronary artery disease presents with abdominal pain and diarrhea patient was found to have acute cholecystitis with choledocholithiasis and mild pancreatitis.  He was started on broad-spectrum antibiotics and general surgery, GI consulted.  In view of increased surgical risk patient did not undergo a cholecystectomy.  He was treated conservatively with antibiotics.  Hospital course complicated by diarrhea which was found to be C. difficile diarrhea, GI bleed probably secondary to Eliquis.  Patient received Kcentra for Eliquis reversal, GI consulted underwent EGD found to have a bleeding vessel at the sphincterotomy site which was endoclipped on 07/24/2021.  He was transferred to ICU for hemorrhagic shock briefly requiring Levophed.  He is currently off all pressors. Over the last one week, we have watched his hemoglobin slowly drop from 9.6 to 8.4,. Eliquis was started on 08/01/21  Subjective / 24h Interval events: He denies any complaints.  He denies any abdominal pain, no nausea or vomiting.  Assessment & Plan: Principal Problem Acute cholecystitis/choledocholithiasis/pain biliary pancreatitis -gastroenterology and general surgery consulted on admission.  S/p ERCP showing choledocholithiasis underwent stone removal with sphincterotomy.  Cholecystectomy was scheduled however it was canceled as cardiology could not clear him for surgery.  General surgery has signed off and recommended outpatient follow-up in the office.  Patient was treated empirically with antibiotics and finished a course while hospitalized. Pt reports occasional intermittent abd pain, resolves spontaneously, and repeat CT scan showed  extensive pneumobilia is noted in the left hepatic ducts as well as in the common bile and cystic ducts. Large amount of air is also noted in the gallbladder lumen. This most likely is due to recent instrumentation.  -due to LFT elevation on 9/14 GI was reconsulted, MRI/MRCP showing mild debris in the CBD, possible 4 mm stone. 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 Cardiology on board and has ordered a PYP scan as outpatient to evaluate for amyloid, SPEP, UPEP and light chains have been ordered. Recommend outpatient follow up. He was initially started on IV Lasix for fluid overload but has been discontinued due to worsening renal parameters.  His renal function has stabilized now. He is starting to have mild LE swelling on 9/15 -Received Lasix x1, kidney function is stable.  Mild thrombocytopenia -platelets are stable today  Lower GI bleed in the setting of C. difficile colitis, hemorrhagic shock-Patient was on Eliquis for anticoagulation for atrial fibrillation which was reversed with Kcentra when he started having GI bleed.  GI consulted patient underwent EGD was found to have a bleeding all vessel at the sphincterotomy site underwent Endo clipping of the bleeding vessel.  Patient was transferred to ICU for hemorrhagic shock and was on Levophed briefly.  Patient currently off all pressors and hemoglobin stable between 8 and 9. No further bleeding seen.    C. difficile diarrhea/ COLITIS-Resolved. Completed the course of oral vancomycin.   DM2 -Hemoglobin A1c is 6.6%.No changes in meds.   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  Currently at baseline  Paroxysmal atrial fibrillation -Rate  controlled, Eliquis now on hold  due to  Coronary artery disease s/p NSTEMI in 2008 s/p stent to RCA and LAD Continue with statin at this time  Mild acute encephalopathy - Probably due to acute illness. Pt appears to be at baseline. MRI brain is negative for acute stroke.   Failure to thrive, poor oral intake-Therapy evaluations are recommending SNF  Scheduled Meds:  Chlorhexidine Gluconate Cloth  6 each Topical Daily   insulin aspart  0-9 Units Subcutaneous TID WC   mouth rinse  15 mL Mouth Rinse BID   melatonin  3 mg Oral QHS   pantoprazole  40 mg Oral BID   rosuvastatin  40 mg Oral QHS   Continuous Infusions:  sodium chloride     PRN Meds:.acetaminophen **OR** acetaminophen, alum & mag hydroxide-simeth, naphazoline-glycerin, ondansetron (ZOFRAN) IV, traMADol, Zinc Oxide  Diet Orders (From admission, onward)     Start     Ordered   08/05/21 0001  Diet NPO time specified  Diet effective midnight        08/04/21 0734            DVT prophylaxis: SCDs Start: 07/18/21 0143     Code Status: DNR  Family Communication: Son over the phone  Status is: Inpatient  Remains inpatient appropriate because:Inpatient level of care appropriate due to severity of illness  Dispo: The patient is from: SNF              Anticipated d/c is to: SNF              Patient currently is not medically stable to d/c.   Difficult to place patient No   Level of care: Telemetry  Consultants:  GI PCCM  Procedures:  ERCP EGD  Microbiology  none  Antimicrobials: None currently    Objective: Vitals:   08/04/21 0523 08/04/21 1326 08/04/21 2033 08/05/21 0529  BP: 102/68 99/61 (!) 95/58 (!) 96/54  Pulse: (!) 55 79 69 71  Resp: '14 17 14 14  '$ Temp: 97.6 F (36.4 C) (!) 97.5 F (36.4 C) 97.7 F (36.5 C) (!) 97.5 F (36.4 C)  TempSrc: Oral Oral Oral Oral  SpO2: 99% 98% 100% 99%  Weight: 94.9 kg   94.8 kg  Height:        Intake/Output Summary (Last 24 hours) at 08/05/2021 1142 Last data filed at 08/05/2021  0533 Gross per 24 hour  Intake 240 ml  Output 900 ml  Net -660 ml    Filed Weights   08/03/21 0458 08/04/21 0523 08/05/21 0529  Weight: 94.6 kg 94.9 kg 94.8 kg    Examination:  Constitutional: In bed, no distress Eyes: Anicteric ENMT: mmm Neck: normal, supple Respiratory: Lungs are clear, no wheezing heard Cardiovascular: Regular rate and rhythm, trace edema Abdomen: Abdomen is soft, nontender, nondistended, bowel sounds positive Musculoskeletal: no clubbing / cyanosis.  Skin: No new rashes seen Neurologic: Nonfocal   Data Reviewed: I have independently reviewed following labs and imaging studies  CBC: Recent Labs  Lab 08/01/21 0909 08/02/21 0444 08/03/21 0505 08/04/21 0532 08/05/21 0500  WBC 8.8 8.6 8.8 7.3 7.2  NEUTROABS 7.2  --   --   --   --   HGB 8.4* 8.4* 8.6* 8.6* 8.4*  HCT 26.9* 26.7* 26.9* 27.8* 27.4*  MCV 84.3 83.2 81.0 81.8 82.3  PLT 143* 143* 143* 124* 124*    Basic Metabolic Panel: Recent Labs  Lab 07/31/21 0607 08/01/21 0909 08/03/21 0505 08/04/21 0532 08/05/21 0500  NA  135 130* 136 132* 133*  K 4.3 3.8 4.0 3.9 3.9  CL 102 97* 104 99 100  CO2 '26 26 25 25 28  '$ GLUCOSE 160* 203* 177* 149* 209*  BUN 30* 26* 25* 25* 28*  CREATININE 1.88* 1.87* 1.72* 1.79* 1.79*  CALCIUM 8.5* 8.1* 8.4* 8.1* 8.2*    Liver Function Tests: Recent Labs  Lab 07/30/21 0919 08/01/21 0909 08/03/21 0505 08/04/21 0532 08/05/21 0500  AST 53* 123* 289* 279* 190*  ALT 23 31 64* 74* 78*  ALKPHOS 285* 241* 231* 207* 218*  BILITOT 1.7* 1.7* 1.5* 1.3* 1.1  PROT 5.7* 5.5* 5.1* 5.1* 5.2*  ALBUMIN 2.5* 2.4* 2.3* 2.3* 2.4*    Coagulation Profile: No results for input(s): INR, PROTIME in the last 168 hours. HbA1C: No results for input(s): HGBA1C in the last 72 hours. CBG: Recent Labs  Lab 08/04/21 0738 08/04/21 1141 08/04/21 1756 08/04/21 2035 08/05/21 0736  GLUCAP 133* 129* 204* 217* 169*     Recent Results (from the past 240 hour(s))  Resp Panel by  RT-PCR (Flu A&B, Covid) Nasopharyngeal Swab     Status: None   Collection Time: 08/01/21  9:52 AM   Specimen: Nasopharyngeal Swab; Nasopharyngeal(NP) swabs in vial transport medium  Result Value Ref Range Status   SARS Coronavirus 2 by RT PCR NEGATIVE NEGATIVE Final    Comment: (NOTE) SARS-CoV-2 target nucleic acids are NOT DETECTED.  The SARS-CoV-2 RNA is generally detectable in upper respiratory specimens during the acute phase of infection. The lowest concentration of SARS-CoV-2 viral copies this assay can detect is 138 copies/mL. A negative result does not preclude SARS-Cov-2 infection and should not be used as the sole basis for treatment or other patient management decisions. A negative result may occur with  improper specimen collection/handling, submission of specimen other than nasopharyngeal swab, presence of viral mutation(s) within the areas targeted by this assay, and inadequate number of viral copies(<138 copies/mL). A negative result must be combined with clinical observations, patient history, and epidemiological information. The expected result is Negative.  Fact Sheet for Patients:  EntrepreneurPulse.com.au  Fact Sheet for Healthcare Providers:  IncredibleEmployment.be  This test is no t yet approved or cleared by the [place] FDA and  has been authorized for detection and/or diagnosis of SARS-CoV-2 by FDA under an Emergency Use Authorization (EUA). This EUA will remain  in effect (meaning this test can be used) for the duration of the COVID-19 declaration under Section 564(b)(1) of the Act, 21 U.S.C.section 360bbb-3(b)(1), unless the authorization is terminated  or revoked sooner.       Influenza A by PCR NEGATIVE NEGATIVE Final   Influenza B by PCR NEGATIVE NEGATIVE Final    Comment: (NOTE) The Xpert Xpress SARS-CoV-2/FLU/RSV plus assay is intended as an aid in the diagnosis of influenza from Nasopharyngeal swab  specimens and should not be used as a sole basis for treatment. Nasal washings and aspirates are unacceptable for Xpert Xpress SARS-CoV-2/FLU/RSV testing.  Fact Sheet for Patients: EntrepreneurPulse.com.au  Fact Sheet for Healthcare Providers: IncredibleEmployment.be  This test is not yet approved or cleared by the [place] FDA and has been authorized for detection and/or diagnosis of SARS-CoV-2 by FDA under an Emergency Use Authorization (EUA). This EUA will remain in effect (meaning this test can be used) for the duration of the COVID-19 declaration under Section 564(b)(1) of the Act, 21 U.S.C. section 360bbb-3(b)(1), unless the authorization is terminated or revoked.  Performed at [facility], [address] [place],  [place] [address]       Radiology Studies: No results found.   Marzetta Board, MD, PhD [facility]  Between 7 am - 7 pm I am available, please contact me via Amion (for emergencies) or Securechat (non urgent messages)  Between 7 pm - 7 am I am not available, please contact night coverage MD/APP via Amion

## 2021-08-05 NOTE — Care Management Important Message (Signed)
Important Message  Patient Details IM Letter placed in Patient's room. Name: [name] [name] MRN: [id number] Date of Birth: 11-24-1950   Medicare Important Message Given:  Yes     Kerin Salen 08/05/2021, 9:43 AM

## 2021-08-06 DIAGNOSIS — K8080 Other cholelithiasis without obstruction: Secondary | ICD-10-CM | POA: Diagnosis not present

## 2021-08-06 DIAGNOSIS — K810 Acute cholecystitis: Secondary | ICD-10-CM | POA: Diagnosis not present

## 2021-08-06 DIAGNOSIS — K8050 Calculus of bile duct without cholangitis or cholecystitis without obstruction: Secondary | ICD-10-CM | POA: Diagnosis not present

## 2021-08-06 LAB — COMPREHENSIVE METABOLIC PANEL
ALT: 78 U/L — ABNORMAL HIGH (ref 0–44)
AST: 133 U/L — ABNORMAL HIGH (ref 15–41)
Albumin: 2.5 g/dL — ABNORMAL LOW (ref 3.5–5.0)
Alkaline Phosphatase: 209 U/L — ABNORMAL HIGH (ref 38–126)
Anion gap: 6 (ref 5–15)
BUN: 25 mg/dL — ABNORMAL HIGH (ref 8–23)
CO2: 27 mmol/L (ref 22–32)
Calcium: 8.3 mg/dL — ABNORMAL LOW (ref 8.9–10.3)
Chloride: 101 mmol/L (ref 98–111)
Creatinine, Ser: 1.7 mg/dL — ABNORMAL HIGH (ref 0.61–1.24)
GFR, Estimated: 43 mL/min — ABNORMAL LOW (ref >60)
Glucose, Bld: 163 mg/dL — ABNORMAL HIGH (ref 70–99)
Potassium: 3.8 mmol/L (ref 3.5–5.1)
Sodium: 134 mmol/L — ABNORMAL LOW (ref 135–145)
Total Bilirubin: 1.2 mg/dL (ref 0.3–1.2)
Total Protein: 5.4 g/dL — ABNORMAL LOW (ref 6.5–8.1)

## 2021-08-06 LAB — CBC
HCT: 27.9 % — ABNORMAL LOW (ref 39.0–52.0)
Hemoglobin: 8.5 g/dL — ABNORMAL LOW (ref 13.0–17.0)
MCH: 25.4 pg — ABNORMAL LOW (ref 26.0–34.0)
MCHC: 30.5 g/dL (ref 30.0–36.0)
MCV: 83.5 fL (ref 80.0–100.0)
Platelets: 116 10*3/uL — ABNORMAL LOW (ref 150–400)
RBC: 3.34 MIL/uL — ABNORMAL LOW (ref 4.22–5.81)
RDW: 20.7 % — ABNORMAL HIGH (ref 11.5–15.5)
WBC: 6.3 10*3/uL (ref 4.0–10.5)
nRBC: 0 % (ref 0.0–0.2)

## 2021-08-06 LAB — GLUCOSE, CAPILLARY
Glucose-Capillary: 153 mg/dL — ABNORMAL HIGH (ref 70–99)
Glucose-Capillary: 147 mg/dL — ABNORMAL HIGH (ref 70–99)
Glucose-Capillary: 165 mg/dL — ABNORMAL HIGH (ref 70–99)
Glucose-Capillary: 223 mg/dL — ABNORMAL HIGH (ref 70–99)

## 2021-08-06 MED ORDER — FUROSEMIDE 40 MG PO TABS
40.0000 mg | ORAL_TABLET | Freq: Once | ORAL | Status: AC
  Administered 2021-08-06: 40 mg via ORAL
  Filled 2021-08-06: qty 1

## 2021-08-06 NOTE — Plan of Care (Signed)
Denies pain, flat affect, eats 50-100% of meals. Lasix PO given. PO intake encouraged. Refused SCDs. Call bell in reach. Fall precautions in place.    Problem: Education: Goal: Knowledge of General Education information will improve Description: Including pain rating scale, medication(s)/side effects and non-pharmacologic comfort measures Outcome: Progressing   Problem: Health Behavior/Discharge Planning: Goal: Ability to manage health-related needs will improve Outcome: Progressing   Problem: Clinical Measurements: Goal: Ability to maintain clinical measurements within normal limits will improve Outcome: Progressing   Problem: Nutrition: Goal: Adequate nutrition will be maintained Outcome: Progressing

## 2021-08-06 NOTE — Progress Notes (Signed)
PROGRESS NOTE  [name] [name] [id number] DOB: May 14, 1951 DOA: 07/17/2021 PCP: Raeanne Gathers, MD   LOS: 19 days   Brief Narrative / Interim history: 70 year old gentleman prior history of hypertension, diabetes, chronic systolic heart failure, stage IIIa CKD, paroxysmal atrial fibrillation, coronary artery disease presents with abdominal pain and diarrhea patient was found to have acute cholecystitis with choledocholithiasis and mild pancreatitis.  He was started on broad-spectrum antibiotics and general surgery, GI consulted.  In view of increased surgical risk patient did not undergo a cholecystectomy.  He was treated conservatively with antibiotics.  Hospital course complicated by diarrhea which was found to be C. difficile diarrhea, GI bleed probably secondary to Eliquis.  Patient received Kcentra for Eliquis reversal, GI consulted underwent EGD found to have a bleeding vessel at the sphincterotomy site which was endoclipped on 07/24/2021.  He was transferred to ICU for hemorrhagic shock briefly requiring Levophed.  He is currently off all pressors. Over the last one week, we have watched his hemoglobin slowly drop from 9.6 to 8.4,. Eliquis was started on 08/01/21.  He had a brief LFT elevation on 9/14, repeat MRCP with concern for recurrent choledocholithiasis, ERCP on 9/16 was actually negative.  Subjective / 24h Interval events: No complaints, feels well, overall weak but no nausea, no vomiting, no abdominal pain, no chest pain, no shortness of breath  Assessment & Plan: Principal Problem Acute cholecystitis/choledocholithiasis/pain biliary pancreatitis -gastroenterology and general surgery consulted on admission.  S/p ERCP showing choledocholithiasis underwent stone removal with sphincterotomy.  Cholecystectomy was scheduled however it was canceled as cardiology could not clear him for surgery.  General surgery has signed off and recommended outpatient follow-up in the office.  Patient was  treated empirically with antibiotics and finished a course while hospitalized. Pt reports occasional intermittent abd pain, resolves spontaneously, and repeat CT scan showed extensive pneumobilia is noted in the left hepatic ducts as well as in the common bile and cystic ducts. Large amount of air is also noted in the gallbladder lumen. This most likely is due to recent instrumentation.  -due to LFT elevation on 9/14 GI was reconsulted, MRI/MRCP showing mild debris in the CBD, possible 4 mm stone. D/w Dr Benson [place], he underwent repeat ERCP on 9/16 which was negative, suggesting that there may have been mild sludge/small stone that has passed.  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 Cardiology on board and has ordered a PYP scan as outpatient to evaluate for amyloid, SPEP, UPEP and light chains have been ordered. Recommend outpatient follow up. He was initially started on IV Lasix for fluid overload but has been discontinued due to worsening renal parameters.  His renal function has stabilized now. He is starting to have mild LE swelling on 9/15 -Repeat Lasix today  Mild thrombocytopenia -platelets are stable today  Lower GI bleed in the setting of C. difficile colitis, hemorrhagic shock-Patient was on Eliquis for anticoagulation for atrial fibrillation which was reversed with Kcentra when he started having GI bleed.  GI consulted patient underwent EGD was found to have a bleeding all vessel at the sphincterotomy site underwent Endo clipping of the bleeding vessel.  Patient was transferred to ICU for hemorrhagic shock and was on Levophed briefly.  Patient currently off all pressors and hemoglobin stable between 8 and 9.  Hemoglobin stable without further bleeding while on Eliquis  C. difficile diarrhea/  COLITIS-Resolved. Completed  the course of oral vancomycin.   DM2 -Hemoglobin A1c is 6.6%.No changes in meds.   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  Currently at baseline  Paroxysmal atrial fibrillation -Rate controlled, Eliquis now on hold due to  Coronary artery disease s/p NSTEMI in 2008 s/p stent to RCA and LAD Continue with statin at this time  Mild acute encephalopathy - Probably due to acute illness. Pt appears to be at baseline. MRI brain is negative for acute stroke.   Failure to thrive, poor oral intake-Therapy evaluations are recommending SNF, likely on Monday per SW  Scheduled Meds:  apixaban  5 mg Oral BID   Chlorhexidine Gluconate Cloth  6 each Topical Daily   furosemide  40 mg Oral Once   insulin aspart  0-9 Units Subcutaneous TID WC   mouth rinse  15 mL Mouth Rinse BID   melatonin  3 mg Oral QHS   pantoprazole  40 mg Oral BID   rosuvastatin  40 mg Oral QHS   Continuous Infusions:  sodium chloride     PRN Meds:.acetaminophen **OR** acetaminophen, alum & mag hydroxide-simeth, naphazoline-glycerin, ondansetron (ZOFRAN) IV, traMADol, Zinc Oxide  Diet Orders (From admission, onward)     Start     Ordered   08/05/21 1609  Diet regular Room service appropriate? Yes; Fluid consistency: Thin  Diet effective now       Question Answer Comment  Room service appropriate? Yes   Fluid consistency: Thin      08/05/21 1608            DVT prophylaxis: SCDs Start: 07/18/21 0143 apixaban (ELIQUIS) tablet 5 mg     Code Status: DNR  Family Communication: Son over the phone  Status is: Inpatient  Remains inpatient appropriate because:Inpatient level of care appropriate due to severity of illness  Dispo: The patient is from: SNF              Anticipated d/c is to: SNF              Patient currently is not medically stable to d/c.   Difficult to place patient No   Level of care: Telemetry  Consultants:   GI PCCM  Procedures:  ERCP EGD  Microbiology  none  Antimicrobials: None currently    Objective: Vitals:   08/05/21 1735 08/05/21 2215 08/06/21 0438 08/06/21 0500  BP: (!) '90/59 92/65 96/64 '$   Pulse: 69 78 68   Resp: '18 18 18   '$ Temp: 97.7 F (36.5 C) 97.6 F (36.4 C) 97.7 F (36.5 C)   TempSrc: Oral Oral Oral   SpO2: 94% 100% 97%   Weight:    96.3 kg  Height:        Intake/Output Summary (Last 24 hours) at 08/06/2021 1137 Last data filed at 08/06/2021 1000 Gross per 24 hour  Intake 660 ml  Output 750 ml  Net -90 ml    Filed Weights   08/04/21 0523 08/05/21 0529 08/06/21 0500  Weight: 94.9 kg 94.8 kg 96.3 kg    Examination:  Constitutional: NAD, in bed Eyes: Anicteric ENMT: Moist mucous membranes Neck: normal, supple Respiratory: Clear bilaterally, no wheezing, no crackles Cardiovascular: Regular rate and rhythm, trace edema Abdomen: Soft, nontender, nondistended, bowel sounds positive Musculoskeletal: no clubbing / cyanosis.  Skin: No rashes seen Neurologic: No focal deficits   Data Reviewed: I have independently reviewed following labs and imaging studies  CBC: Recent Labs  Lab 08/01/21 0909 08/02/21 0444 08/03/21 0505 08/04/21 0532 08/05/21 0500 08/06/21 0836  WBC 8.8 8.6 8.8 7.3 7.2 6.3  NEUTROABS 7.2  --   --   --   --   --   HGB 8.4* 8.4* 8.6* 8.6* 8.4* 8.5*  HCT 26.9* 26.7* 26.9* 27.8* 27.4* 27.9*  MCV 84.3 83.2 81.0 81.8 82.3 83.5  PLT 143* 143* 143* 124* 124* 116*    Basic Metabolic Panel: Recent Labs  Lab 08/01/21 0909 08/03/21 0505 08/04/21 0532 08/05/21 0500 08/06/21 0836  NA 130* 136 132* 133* 134*  K 3.8 4.0 3.9 3.9 3.8  CL 97* 104 99 100 101  CO2 '26 25 25 28 27  '$ GLUCOSE 203* 177* 149* 209* 163*  BUN 26* 25* 25* 28* 25*  CREATININE 1.87* 1.72* 1.79* 1.79* 1.70*  CALCIUM 8.1* 8.4* 8.1* 8.2* 8.3*    Liver Function Tests: Recent Labs  Lab 08/01/21 0909 08/03/21 0505 08/04/21 0532 08/05/21 0500 08/06/21 0836   AST 123* 289* 279* 190* 133*  ALT 31 64* 74* 78* 78*  ALKPHOS 241* 231* 207* 218* 209*  BILITOT 1.7* 1.5* 1.3* 1.1 1.2  PROT 5.5* 5.1* 5.1* 5.2* 5.4*  ALBUMIN 2.4* 2.3* 2.3* 2.4* 2.5*    Coagulation Profile: No results for input(s): INR, PROTIME in the last 168 hours. HbA1C: No results for input(s): HGBA1C in the last 72 hours. CBG: Recent Labs  Lab 08/05/21 1314 08/05/21 1730 08/05/21 2218 08/06/21 0750 08/06/21 1029  GLUCAP 126* 130* 230* 153* 147*     Recent Results (from the past 240 hour(s))  Resp Panel by RT-PCR (Flu A&B, Covid) Nasopharyngeal Swab     Status: None   Collection Time: 08/01/21  9:52 AM   Specimen: Nasopharyngeal Swab; Nasopharyngeal(NP) swabs in vial transport medium  Result Value Ref Range Status   SARS Coronavirus 2 by RT PCR NEGATIVE NEGATIVE Final    Comment: (NOTE) SARS-CoV-2 target nucleic acids are NOT DETECTED.  The SARS-CoV-2 RNA is generally detectable in upper respiratory specimens during the acute phase of infection. The lowest concentration of SARS-CoV-2 viral copies this assay can detect is 138 copies/mL. A negative result does not preclude SARS-Cov-2 infection and should not be used as the sole basis for treatment or other patient management decisions. A negative result may occur with  improper specimen collection/handling, submission of specimen other than nasopharyngeal swab, presence of viral mutation(s) within the areas targeted by this assay, and inadequate number of viral copies(<138 copies/mL). A negative result must be combined with clinical observations, patient history, and epidemiological information. The expected result is Negative.  Fact Sheet for Patients:  EntrepreneurPulse.com.au  Fact Sheet for Healthcare Providers:  IncredibleEmployment.be  This test is no t yet approved or cleared by the [place] FDA and  has been authorized for detection and/or diagnosis of  SARS-CoV-2 by FDA under an Emergency Use Authorization (EUA). This EUA will remain  in effect (meaning this test can be used) for the duration of the COVID-19 declaration under Section 564(b)(1) of the Act, 21 U.S.C.section 360bbb-3(b)(1), unless the authorization is terminated  or revoked sooner.       Influenza A by PCR NEGATIVE NEGATIVE Final   Influenza B by PCR NEGATIVE NEGATIVE Final    Comment: (NOTE) The Xpert Xpress SARS-CoV-2/FLU/RSV plus assay is intended as an aid in the diagnosis of influenza from Nasopharyngeal swab specimens and should not be used as a sole basis for treatment. Nasal washings and aspirates are unacceptable for  Xpert Xpress SARS-CoV-2/FLU/RSV testing.  Fact Sheet for Patients: EntrepreneurPulse.com.au  Fact Sheet for Healthcare Providers: IncredibleEmployment.be  This test is not yet approved or cleared by the [place] FDA and has been authorized for detection and/or diagnosis of SARS-CoV-2 by FDA under an Emergency Use Authorization (EUA). This EUA will remain in effect (meaning this test can be used) for the duration of the COVID-19 declaration under Section 564(b)(1) of the Act, 21 U.S.C. section 360bbb-3(b)(1), unless the authorization is terminated or revoked.  Performed at [facility], [place] [address], [place], [place] [address]       Radiology Studies: DG ERCP BILIARY & PANCREATIC DUCTS  Result Date: 08/05/2021 CLINICAL DATA:  Abdominal pain, possible choledocholithiasis on MRCP EXAM: ERCP TECHNIQUE: Multiple spot images obtained with the fluoroscopic device and submitted for interpretation post-procedure. COMPARISON:  MRCP from 08/03/2021 FINDINGS: Series of fluoroscopic spot images document endoscopic cannulation opacification of the CBD with subsequent passage of a balloon catheter through the length of the CBD. Contrast fills the cystic duct and partially opacifies the  nondilated gallbladder. There is incomplete opacification of the intrahepatic biliary tree which appears decompressed centrally. IMPRESSION: Endoscopic CBD cannulation and intervention as above. These images were submitted for radiologic interpretation only. Please see the procedural report for the amount of contrast and the fluoroscopy time utilized. Electronically Signed   By: Lucrezia Europe M.D.   On: 08/05/2021 15:01     Marzetta Board, MD, PhD [facility]  Between 7 am - 7 pm I am available, please contact me via Amion (for emergencies) or Securechat (non urgent messages)  Between 7 pm - 7 am I am not available, please contact night coverage MD/APP via Amion

## 2021-08-07 DIAGNOSIS — K810 Acute cholecystitis: Secondary | ICD-10-CM | POA: Diagnosis not present

## 2021-08-07 DIAGNOSIS — K8050 Calculus of bile duct without cholangitis or cholecystitis without obstruction: Secondary | ICD-10-CM | POA: Diagnosis not present

## 2021-08-07 DIAGNOSIS — K8080 Other cholelithiasis without obstruction: Secondary | ICD-10-CM | POA: Diagnosis not present

## 2021-08-07 LAB — COMPREHENSIVE METABOLIC PANEL
ALT: 71 U/L — ABNORMAL HIGH (ref 0–44)
AST: 95 U/L — ABNORMAL HIGH (ref 15–41)
Albumin: 2.4 g/dL — ABNORMAL LOW (ref 3.5–5.0)
Alkaline Phosphatase: 196 U/L — ABNORMAL HIGH (ref 38–126)
Anion gap: 9 (ref 5–15)
BUN: 22 mg/dL (ref 8–23)
CO2: 27 mmol/L (ref 22–32)
Calcium: 8.1 mg/dL — ABNORMAL LOW (ref 8.9–10.3)
Chloride: 98 mmol/L (ref 98–111)
Creatinine, Ser: 1.53 mg/dL — ABNORMAL HIGH (ref 0.61–1.24)
GFR, Estimated: 49 mL/min — ABNORMAL LOW (ref >60)
Glucose, Bld: 178 mg/dL — ABNORMAL HIGH (ref 70–99)
Potassium: 3.7 mmol/L (ref 3.5–5.1)
Sodium: 134 mmol/L — ABNORMAL LOW (ref 135–145)
Total Bilirubin: 1.2 mg/dL (ref 0.3–1.2)
Total Protein: 5.3 g/dL — ABNORMAL LOW (ref 6.5–8.1)

## 2021-08-07 LAB — CBC
HCT: 26 % — ABNORMAL LOW (ref 39.0–52.0)
Hemoglobin: 7.9 g/dL — ABNORMAL LOW (ref 13.0–17.0)
MCH: 25.1 pg — ABNORMAL LOW (ref 26.0–34.0)
MCHC: 30.4 g/dL (ref 30.0–36.0)
MCV: 82.5 fL (ref 80.0–100.0)
Platelets: 110 10*3/uL — ABNORMAL LOW (ref 150–400)
RBC: 3.15 MIL/uL — ABNORMAL LOW (ref 4.22–5.81)
RDW: 20.6 % — ABNORMAL HIGH (ref 11.5–15.5)
WBC: 6 10*3/uL (ref 4.0–10.5)
nRBC: 0 % (ref 0.0–0.2)

## 2021-08-07 LAB — GLUCOSE, CAPILLARY
Glucose-Capillary: 150 mg/dL — ABNORMAL HIGH (ref 70–99)
Glucose-Capillary: 200 mg/dL — ABNORMAL HIGH (ref 70–99)
Glucose-Capillary: 200 mg/dL — ABNORMAL HIGH (ref 70–99)
Glucose-Capillary: 187 mg/dL — ABNORMAL HIGH (ref 70–99)

## 2021-08-07 MED ORDER — POLYETHYLENE GLYCOL 3350 17 G PO PACK
17.0000 g | PACK | Freq: Every day | ORAL | Status: DC
  Administered 2021-08-07 – 2021-08-08 (×2): 17 g via ORAL
  Filled 2021-08-07 (×3): qty 1

## 2021-08-07 NOTE — Plan of Care (Signed)
Pt had a pleasant day. Able to ride the halls with [employer]. Good spirits. Ate all meals. Bath given. Family visited. No concerns.    Problem: Education: Goal: Knowledge of General Education information will improve Description: Including pain rating scale, medication(s)/side effects and non-pharmacologic comfort measures Outcome: Progressing   Problem: Health Behavior/Discharge Planning: Goal: Ability to manage health-related needs will improve Outcome: Progressing   Problem: Clinical Measurements: Goal: Ability to maintain clinical measurements within normal limits will improve Outcome: Progressing Goal: Will remain free from infection Outcome: Progressing Goal: Diagnostic test results will improve Outcome: Progressing Goal: Respiratory complications will improve Outcome: Progressing Goal: Cardiovascular complication will be avoided Outcome: Progressing   Problem: Activity: Goal: Risk for activity intolerance will decrease Outcome: Progressing   Problem: Nutrition: Goal: Adequate nutrition will be maintained Outcome: Progressing   Problem: Coping: Goal: Level of anxiety will decrease Outcome: Progressing   Problem: Elimination: Goal: Will not experience complications related to bowel motility Outcome: Progressing Goal: Will not experience complications related to urinary retention Outcome: Progressing   Problem: Pain Managment: Goal: General experience of comfort will improve Outcome: Progressing   Problem: Safety: Goal: Ability to remain free from injury will improve Outcome: Progressing   Problem: Skin Integrity: Goal: Risk for impaired skin integrity will decrease Outcome: Progressing

## 2021-08-07 NOTE — Progress Notes (Signed)
Per telemetry patient had an 11 beat run of V-tach. Provider notified and strip saved. Will continue to monitor.

## 2021-08-07 NOTE — Progress Notes (Signed)
PROGRESS NOTE  [name] [name] [id number] DOB: 04-Jan-1951 DOA: 07/17/2021 PCP: Raeanne Gathers, MD   LOS: 20 days   Brief Narrative / Interim history: 70 year old gentleman prior history of hypertension, diabetes, chronic systolic heart failure, stage IIIa CKD, paroxysmal atrial fibrillation, coronary artery disease presents with abdominal pain and diarrhea patient was found to have acute cholecystitis with choledocholithiasis and mild pancreatitis.  He was started on broad-spectrum antibiotics and general surgery, GI consulted.  In view of increased surgical risk patient did not undergo a cholecystectomy.  He was treated conservatively with antibiotics.  Hospital course complicated by diarrhea which was found to be C. difficile diarrhea, GI bleed probably secondary to Eliquis.  Patient received Kcentra for Eliquis reversal, GI consulted underwent EGD found to have a bleeding vessel at the sphincterotomy site which was endoclipped on 07/24/2021.  He was transferred to ICU for hemorrhagic shock briefly requiring Levophed.  He is currently off all pressors. Over the last one week, we have watched his hemoglobin slowly drop from 9.6 to 8.4,. Eliquis was started on 08/01/21.  He had a brief LFT elevation on 9/14, repeat MRCP with concern for recurrent choledocholithiasis, ERCP on 9/16 was actually negative.  Subjective / 24h Interval events: No abdominal pain, no nausea or vomiting.  No chest pain, no shortness of breath.  Assessment & Plan: Principal Problem Acute cholecystitis/choledocholithiasis/pain biliary pancreatitis -gastroenterology and general surgery consulted on admission.  S/p ERCP showing choledocholithiasis underwent stone removal with sphincterotomy.  Cholecystectomy was scheduled however it was canceled as cardiology could not clear him for surgery.  General surgery has signed off and recommended outpatient follow-up in the office.  Patient was treated empirically with antibiotics and  finished a course while hospitalized. Pt reports occasional intermittent abd pain, resolves spontaneously, and repeat CT scan showed extensive pneumobilia is noted in the left hepatic ducts as well as in the common bile and cystic ducts. Large amount of air is also noted in the gallbladder lumen. This most likely is due to recent instrumentation.  -due to LFT elevation on 9/14 GI was reconsulted, MRI/MRCP showing mild debris in the CBD, possible 4 mm stone. D/w Dr Benson Norway, he underwent repeat ERCP on 9/16 which was negative, suggesting that there may have been mild sludge/small stone that has passed.  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 Cardiology on board and has ordered a PYP scan as outpatient to evaluate for amyloid, SPEP, UPEP and light chains have been ordered. Recommend outpatient follow up. He was initially started on IV Lasix for fluid overload but has been discontinued due to worsening renal parameters.  His renal function has stabilized now. He is starting to have mild LE swelling on 9/15 -Fairly good volume status today  Mild thrombocytopenia -possibly consumptive, platelets are stable today.  There is a chronic component to this as he has been thrombocytopenic as far back as 2017  Lower GI bleed in the setting of C. difficile colitis, hemorrhagic shock-Patient was on Eliquis for anticoagulation for atrial fibrillation which was reversed with Kcentra when he started having GI bleed.  GI consulted patient underwent EGD was found to have a bleeding all vessel at the sphincterotomy site underwent Endo clipping of the bleeding vessel.  Patient was transferred to ICU for hemorrhagic shock and was on Levophed briefly.  Patient currently off all pressors and hemoglobin stable  between 8 and 9.  Hemoglobin stable without  further bleeding while on Eliquis.  Hemoglobin this morning 7.9, but clinically no evidence of bleeding  C. difficile diarrhea/ COLITIS-Resolved. Completed the course of oral vancomycin.   DM2 -Hemoglobin A1c is 6.6%.No changes in meds.   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  Currently at baseline, creatinine 1.5 this morning  Paroxysmal atrial fibrillation -Rate controlled, resumed Eliquis post ERCP  Coronary artery disease s/p NSTEMI in 2008 s/p stent to RCA and LAD Continue with statin at this time  Mild acute encephalopathy - Probably due to acute illness. Pt appears to be at baseline. MRI brain is negative for acute stroke.   Failure to thrive, poor oral intake-Therapy evaluations are recommending SNF, likely on Monday per SW  Scheduled Meds:  apixaban  5 mg Oral BID   Chlorhexidine Gluconate Cloth  6 each Topical Daily   insulin aspart  0-9 Units Subcutaneous TID WC   mouth rinse  15 mL Mouth Rinse BID   melatonin  3 mg Oral QHS   pantoprazole  40 mg Oral BID   polyethylene glycol  17 g Oral Daily   rosuvastatin  40 mg Oral QHS   Continuous Infusions:  sodium chloride     PRN Meds:.acetaminophen **OR** acetaminophen, alum & mag hydroxide-simeth, naphazoline-glycerin, ondansetron (ZOFRAN) IV, traMADol, Zinc Oxide  Diet Orders (From admission, onward)     Start     Ordered   08/05/21 1609  Diet regular Room service appropriate? Yes; Fluid consistency: Thin  Diet effective now       Question Answer Comment  Room service appropriate? Yes   Fluid consistency: Thin      08/05/21 1608            DVT prophylaxis: SCDs Start: 07/18/21 0143 apixaban (ELIQUIS) tablet 5 mg     Code Status: DNR  Family Communication: will call son later  Status is: Inpatient  Remains inpatient appropriate because:Inpatient level of care appropriate due to severity of illness  Dispo: The patient  is from: SNF              Anticipated d/c is to: SNF              Patient currently is not medically stable to d/c.   Difficult to place patient No   Level of care: Telemetry  Consultants:  GI PCCM  Procedures:  ERCP EGD  Microbiology  none  Antimicrobials: None currently    Objective: Vitals:   08/06/21 0500 08/06/21 1418 08/06/21 2232 08/07/21 0643  BP:  93/65 95/61 (!) 95/59  Pulse:  67 76 66  Resp:  '17 20 20  '$ Temp:  97.7 F (36.5 C) (!) 97.5 F (36.4 C) 97.7 F (36.5 C)  TempSrc:  Oral Oral Oral  SpO2:  97% 96% 98%  Weight: 96.3 kg     Height:        Intake/Output Summary (Last 24 hours) at 08/07/2021 1206 Last data filed at 08/07/2021 0706 Gross per 24 hour  Intake 360 ml  Output 1500 ml  Net -1140 ml    Filed Weights   08/04/21 0523 08/05/21 0529 08/06/21 0500  Weight: 94.9 kg 94.8 kg 96.3 kg    Examination:  Constitutional: No distress, in bed Eyes: No scleral icterus ENMT: mmm Neck: normal, supple Respiratory: Clear bilaterally, no wheezing, no crackles Cardiovascular: Regular rate and rhythm, trace edema Abdomen:  Soft, NT, ND, bowel sounds positive Musculoskeletal: no clubbing / cyanosis.  Skin: No rashes seen Neurologic: Nonfocal   Data Reviewed: I have independently reviewed following labs and imaging studies  CBC: Recent Labs  Lab 08/01/21 0909 08/02/21 0444 08/03/21 0505 08/04/21 0532 08/05/21 0500 08/06/21 0836 08/07/21 0559  WBC 8.8   < > 8.8 7.3 7.2 6.3 6.0  NEUTROABS 7.2  --   --   --   --   --   --   HGB 8.4*   < > 8.6* 8.6* 8.4* 8.5* 7.9*  HCT 26.9*   < > 26.9* 27.8* 27.4* 27.9* 26.0*  MCV 84.3   < > 81.0 81.8 82.3 83.5 82.5  PLT 143*   < > 143* 124* 124* 116* 110*   < > = values in this interval not displayed.    Basic Metabolic Panel: Recent Labs  Lab 08/03/21 0505 08/04/21 0532 08/05/21 0500 08/06/21 0836 08/07/21 0559  NA 136 132* 133* 134* 134*  K 4.0 3.9 3.9 3.8 3.7  CL 104 99 100 101 98  CO2 '25  25 28 27 27  '$ GLUCOSE 177* 149* 209* 163* 178*  BUN 25* 25* 28* 25* 22  CREATININE 1.72* 1.79* 1.79* 1.70* 1.53*  CALCIUM 8.4* 8.1* 8.2* 8.3* 8.1*    Liver Function Tests: Recent Labs  Lab 08/03/21 0505 08/04/21 0532 08/05/21 0500 08/06/21 0836 08/07/21 0559  AST 289* 279* 190* 133* 95*  ALT 64* 74* 78* 78* 71*  ALKPHOS 231* 207* 218* 209* 196*  BILITOT 1.5* 1.3* 1.1 1.2 1.2  PROT 5.1* 5.1* 5.2* 5.4* 5.3*  ALBUMIN 2.3* 2.3* 2.4* 2.5* 2.4*    Coagulation Profile: No results for input(s): INR, PROTIME in the last 168 hours. HbA1C: No results for input(s): HGBA1C in the last 72 hours. CBG: Recent Labs  Lab 08/06/21 0750 08/06/21 1029 08/06/21 1635 08/06/21 2233 08/07/21 0809  GLUCAP 153* 147* 165* 223* 150*     Recent Results (from the past 240 hour(s))  Resp Panel by RT-PCR (Flu A&B, Covid) Nasopharyngeal Swab     Status: None   Collection Time: 08/01/21  9:52 AM   Specimen: Nasopharyngeal Swab; Nasopharyngeal(NP) swabs in vial transport medium  Result Value Ref Range Status   SARS Coronavirus 2 by RT PCR NEGATIVE NEGATIVE Final    Comment: (NOTE) SARS-CoV-2 target nucleic acids are NOT DETECTED.  The SARS-CoV-2 RNA is generally detectable in upper respiratory specimens during the acute phase of infection. The lowest concentration of SARS-CoV-2 viral copies this assay can detect is 138 copies/mL. A negative result does not preclude SARS-Cov-2 infection and should not be used as the sole basis for treatment or other patient management decisions. A negative result may occur with  improper specimen collection/handling, submission of specimen other than nasopharyngeal swab, presence of viral mutation(s) within the areas targeted by this assay, and inadequate number of viral copies(<138 copies/mL). A negative result must be combined with clinical observations, patient history, and epidemiological information. The expected result is Negative.  Fact Sheet for  Patients:  EntrepreneurPulse.com.au  Fact Sheet for Healthcare Providers:  IncredibleEmployment.be  This test is no t yet approved or cleared by the [place] FDA and  has been authorized for detection and/or diagnosis of SARS-CoV-2 by FDA under an Emergency Use Authorization (EUA). This EUA will remain  in effect (meaning this test can be used) for the duration of the COVID-19 declaration under Section 564(b)(1) of the Act, 21 U.S.C.section 360bbb-3(b)(1), unless the authorization is terminated  or revoked sooner.       Influenza A by PCR NEGATIVE NEGATIVE Final   Influenza B by PCR NEGATIVE NEGATIVE Final    Comment: (NOTE) The Xpert Xpress SARS-CoV-2/FLU/RSV plus assay is intended as an aid in the diagnosis of influenza from Nasopharyngeal swab specimens and should not be used as a sole basis for treatment. Nasal washings and aspirates are unacceptable for Xpert Xpress SARS-CoV-2/FLU/RSV testing.  Fact Sheet for Patients: EntrepreneurPulse.com.au  Fact Sheet for Healthcare Providers: IncredibleEmployment.be  This test is not yet approved or cleared by the [place] FDA and has been authorized for detection and/or diagnosis of SARS-CoV-2 by FDA under an Emergency Use Authorization (EUA). This EUA will remain in effect (meaning this test can be used) for the duration of the COVID-19 declaration under Section 564(b)(1) of the Act, 21 U.S.C. section 360bbb-3(b)(1), unless the authorization is terminated or revoked.  Performed at [facility], [place] [address], [place], [place] [address]       Radiology Studies: No results found.   Marzetta Board, MD, PhD [facility]  Between 7 am - 7 pm I am available, please contact me via Amion (for emergencies) or Securechat (non urgent messages)  Between 7 pm - 7 am I am not available, please contact night coverage MD/APP via  Amion

## 2021-08-08 ENCOUNTER — Encounter (HOSPITAL_COMMUNITY): Payer: Self-pay | Admitting: Gastroenterology

## 2021-08-08 DIAGNOSIS — K8050 Calculus of bile duct without cholangitis or cholecystitis without obstruction: Secondary | ICD-10-CM | POA: Diagnosis not present

## 2021-08-08 DIAGNOSIS — K810 Acute cholecystitis: Secondary | ICD-10-CM | POA: Diagnosis not present

## 2021-08-08 DIAGNOSIS — K8080 Other cholelithiasis without obstruction: Secondary | ICD-10-CM | POA: Diagnosis not present

## 2021-08-08 LAB — COMPREHENSIVE METABOLIC PANEL
ALT: 65 U/L — ABNORMAL HIGH (ref 0–44)
AST: 77 U/L — ABNORMAL HIGH (ref 15–41)
Albumin: 2.4 g/dL — ABNORMAL LOW (ref 3.5–5.0)
Alkaline Phosphatase: 185 U/L — ABNORMAL HIGH (ref 38–126)
Anion gap: 6 (ref 5–15)
BUN: 21 mg/dL (ref 8–23)
CO2: 27 mmol/L (ref 22–32)
Calcium: 8.1 mg/dL — ABNORMAL LOW (ref 8.9–10.3)
Chloride: 100 mmol/L (ref 98–111)
Creatinine, Ser: 1.44 mg/dL — ABNORMAL HIGH (ref 0.61–1.24)
GFR, Estimated: 53 mL/min — ABNORMAL LOW (ref >60)
Glucose, Bld: 252 mg/dL — ABNORMAL HIGH (ref 70–99)
Potassium: 3.8 mmol/L (ref 3.5–5.1)
Sodium: 133 mmol/L — ABNORMAL LOW (ref 135–145)
Total Bilirubin: 1 mg/dL (ref 0.3–1.2)
Total Protein: 5 g/dL — ABNORMAL LOW (ref 6.5–8.1)

## 2021-08-08 LAB — CBC
HCT: 25.4 % — ABNORMAL LOW (ref 39.0–52.0)
HCT: 29.9 % — ABNORMAL LOW (ref 39.0–52.0)
Hemoglobin: 7.7 g/dL — ABNORMAL LOW (ref 13.0–17.0)
Hemoglobin: 9.2 g/dL — ABNORMAL LOW (ref 13.0–17.0)
MCH: 24.8 pg — ABNORMAL LOW (ref 26.0–34.0)
MCH: 25.5 pg — ABNORMAL LOW (ref 26.0–34.0)
MCHC: 30.3 g/dL (ref 30.0–36.0)
MCHC: 30.8 g/dL (ref 30.0–36.0)
MCV: 81.9 fL (ref 80.0–100.0)
MCV: 82.8 fL (ref 80.0–100.0)
Platelets: 97 10*3/uL — ABNORMAL LOW (ref 150–400)
Platelets: 104 K/uL — ABNORMAL LOW (ref 150–400)
RBC: 3.1 MIL/uL — ABNORMAL LOW (ref 4.22–5.81)
RBC: 3.61 MIL/uL — ABNORMAL LOW (ref 4.22–5.81)
RDW: 20.3 % — ABNORMAL HIGH (ref 11.5–15.5)
RDW: 19.8 % — ABNORMAL HIGH (ref 11.5–15.5)
WBC: 5.4 10*3/uL (ref 4.0–10.5)
WBC: 6.1 K/uL (ref 4.0–10.5)
nRBC: 0 % (ref 0.0–0.2)
nRBC: 0 % (ref 0.0–0.2)

## 2021-08-08 LAB — GLUCOSE, CAPILLARY
Glucose-Capillary: 201 mg/dL — ABNORMAL HIGH (ref 70–99)
Glucose-Capillary: 215 mg/dL — ABNORMAL HIGH (ref 70–99)
Glucose-Capillary: 183 mg/dL — ABNORMAL HIGH (ref 70–99)
Glucose-Capillary: 202 mg/dL — ABNORMAL HIGH (ref 70–99)

## 2021-08-08 LAB — PREPARE RBC (CROSSMATCH)

## 2021-08-08 MED ORDER — FUROSEMIDE 40 MG PO TABS
40.0000 mg | ORAL_TABLET | Freq: Once | ORAL | Status: AC
  Administered 2021-08-08: 40 mg via ORAL
  Filled 2021-08-08: qty 1

## 2021-08-08 MED ORDER — SODIUM CHLORIDE 0.9% IV SOLUTION: Freq: Once | INTRAVENOUS | Status: AC

## 2021-08-08 MED ORDER — INSULIN GLARGINE-YFGN 100 UNIT/ML ~~LOC~~ SOLN
10.0000 [IU] | Freq: Every day | SUBCUTANEOUS | Status: DC
  Administered 2021-08-08: 10 [IU] via SUBCUTANEOUS
  Filled 2021-08-08 (×2): qty 0.1

## 2021-08-08 NOTE — Anesthesia Postprocedure Evaluation (Signed)
Anesthesia Post Note  Patient: [name] [name]  Procedure(s) Performed: ENDOSCOPIC RETROGRADE CHOLANGIOPANCREATOGRAPHY (ERCP) REMOVAL OF STONES     Patient location during evaluation: PACU Anesthesia Type: General Level of consciousness: awake and alert Pain management: pain level controlled Vital Signs Assessment: post-procedure vital signs reviewed and stable Respiratory status: spontaneous breathing, nonlabored ventilation, respiratory function stable and patient connected to nasal cannula oxygen Cardiovascular status: blood pressure returned to baseline and stable Postop Assessment: no apparent nausea or vomiting Anesthetic complications: no   No notable events documented.  Last Vitals:  Vitals:   08/08/21 0453 08/08/21 1145  BP: 94/61 (!) 94/58  Pulse: 74 73  Resp:  18  Temp:  36.5 C  SpO2: 98% 100%    Last Pain:  Vitals:   08/08/21 1000  TempSrc:   PainSc: 0-No pain                 Tiajuana Amass

## 2021-08-08 NOTE — Progress Notes (Signed)
Inpatient Diabetes Program Recommendations  AACE/ADA: New Consensus Statement on Inpatient Glycemic Control (2015)  Target Ranges:  Prepandial:   less than 140 mg/dL      Peak postprandial:   less than 180 mg/dL (1-2 hours)      Critically ill patients:  140 - 180 mg/dL   Lab Results  Component Value Date   GLUCAP 201 (H) 08/08/2021   HGBA1C 6.6 (H) 07/18/2021    Review of Glycemic Control Results for KISER, KURUC" (MRN [id number]) as of 08/08/2021 11:58  Ref. Range 08/07/2021 16:25 08/07/2021 22:07 08/08/2021 07:52  Glucose-Capillary Latest Ref Range: 70 - 99 mg/dL 200 (H) 187 (H) 201 (H)   Diabetes history: DM 2 Outpatient Diabetes medications:  Farxiga 10 mg q HS, Lantus 18 units q HS Current orders for Inpatient glycemic control:  Novolog sensitive tid with meals  Inpatient Diabetes Program Recommendations:    Consider restarting a portion of patient's home basal insulin. Consider adding Semglee 10 units daily.   Thanks,  Adah Perl, RN, BC-ADM Inpatient Diabetes Coordinator Pager [phone]  (8a-5p)

## 2021-08-08 NOTE — Progress Notes (Signed)
PROGRESS NOTE  [name] [name] ZOX:[id number] DOB: [birth year] DOA: 07/17/2021 PCP: Raeanne Gathers, MD   LOS: 21 days   Brief Narrative / Interim history: 70 year old gentleman prior history of hypertension, diabetes, chronic systolic heart failure, stage IIIa CKD, paroxysmal atrial fibrillation, coronary artery disease presents with abdominal pain and diarrhea patient was found to have acute cholecystitis with choledocholithiasis and mild pancreatitis.  He was started on broad-spectrum antibiotics and general surgery, GI consulted.  In view of increased surgical risk patient did not undergo a cholecystectomy.  He was treated conservatively with antibiotics.  Hospital course complicated by diarrhea which was found to be C. difficile diarrhea, GI bleed probably secondary to Eliquis.  Patient received Kcentra for Eliquis reversal, GI consulted underwent EGD found to have a bleeding vessel at the sphincterotomy site which was endoclipped on 07/24/2021.  He was transferred to ICU for hemorrhagic shock briefly requiring Levophed.  He is currently off all pressors. Over the last one week, we have watched his hemoglobin slowly drop from 9.6 to 8.4,. Eliquis was started on 08/01/21.  He had a brief LFT elevation on 9/14, repeat MRCP with concern for recurrent choledocholithiasis, ERCP on 9/16 was actually negative.  Subjective / 24h Interval events: Eating breakfast this morning, no chest pain, no shortness of breath.  No abdominal pain, no nausea or vomiting.  Still has not had a bowel movement yet  Assessment & Plan: Principal Problem Acute cholecystitis/choledocholithiasis/pain biliary pancreatitis - GI and general surgery consulted on admission.  S/p ERCP showing choledocholithiasis underwent stone removal with sphincterotomy.  Cholecystectomy was scheduled however it was canceled as cardiology could not clear him for surgery.  General surgery has signed off and recommended outpatient follow-up in the  office.  Patient was treated empirically with antibiotics and finished a course while hospitalized. Pt reports occasional intermittent abd pain, resolves spontaneously, and repeat CT scan showed extensive pneumobilia is noted in the left hepatic ducts as well as in the common bile and cystic ducts. Large amount of air is also noted in the gallbladder lumen. This most likely is due to recent instrumentation.  -due to LFT elevation on 9/14 GI was reconsulted, MRI/MRCP showing mild debris in the CBD, possible 4 mm stone. D/w Dr Benson [place], he underwent repeat ERCP on 9/16 which was negative, suggesting that there may have been mild sludge/small stone that has passed.  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 Cardiology on board and has ordered a PYP scan as outpatient to evaluate for amyloid, SPEP, UPEP and light chains have been ordered. Recommend outpatient follow up. He was initially started on IV Lasix for fluid overload but has been discontinued due to worsening renal parameters.  His renal function has stabilized now. He is starting to have mild LE swelling on 9/15 -Continue oral Lasix  Mild thrombocytopenia -possibly consumptive, platelets are stable today.  There is a chronic component to this as he has been thrombocytopenic as far back as 2017  Lower GI bleed in the setting of C. difficile colitis, hemorrhagic shock-Patient was on Eliquis for anticoagulation for atrial fibrillation which was reversed with Kcentra when he started having GI bleed.  GI consulted patient underwent EGD was found to have a bleeding all vessel at the sphincterotomy site underwent Endo clipping of the bleeding vessel.  Patient was transferred to ICU for hemorrhagic shock and was  on Levophed briefly.  Patient currently off all pressors and clinically  stable.  He has no evidence of bleeding however hemoglobin trending down to 7.7 today.  I will give him a unit of packed red blood cells.  He has not had a bowel movement yet, but unlikely to bleed given constipation, will closely monitor melena/bright red blood with the next bowel movement  C. difficile diarrhea/ COLITIS-Resolved. Completed the course of oral vancomycin.  Now constipated  DM2 -Hemoglobin A1c is 6.6%.No changes in meds.   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  Currently at baseline, creatinine 1.4 this morning, continue oral Lasix  Paroxysmal atrial fibrillation -Rate controlled, resumed Eliquis post ERCP  Coronary artery disease s/p NSTEMI in 2008 s/p stent to RCA and LAD Continue with statin at this time  Mild acute encephalopathy - Probably due to acute illness. Pt appears to be at baseline. MRI brain is negative for acute stroke.   Failure to thrive, poor oral intake-Therapy evaluations are recommending SNF, likely on Monday per SW  Scheduled Meds:  sodium chloride   Intravenous Once   apixaban  5 mg Oral BID   Chlorhexidine Gluconate Cloth  6 each Topical Daily   furosemide  40 mg Oral Once   insulin aspart  0-9 Units Subcutaneous TID WC   mouth rinse  15 mL Mouth Rinse BID   melatonin  3 mg Oral QHS   pantoprazole  40 mg Oral BID   polyethylene glycol  17 g Oral Daily   rosuvastatin  40 mg Oral QHS   Continuous Infusions:  sodium chloride     PRN Meds:.acetaminophen **OR** acetaminophen, alum & mag hydroxide-simeth, naphazoline-glycerin, ondansetron (ZOFRAN) IV, traMADol, Zinc Oxide  Diet Orders (From admission, onward)     Start     Ordered   08/05/21 1609  Diet regular Room service appropriate? Yes; Fluid consistency: Thin  Diet effective now       Question Answer Comment  Room service appropriate? Yes   Fluid consistency: Thin      08/05/21 1608             DVT prophylaxis: SCDs Start: 07/18/21 0143 apixaban (ELIQUIS) tablet 5 mg     Code Status: DNR  Family Communication: will call son later  Status is: Inpatient  Remains inpatient appropriate because:Inpatient level of care appropriate due to severity of illness  Dispo: The patient is from: SNF              Anticipated d/c is to: SNF              Patient currently is not medically stable to d/c.   Difficult to place patient No   Level of care: Telemetry  Consultants:  GI PCCM  Procedures:  ERCP EGD  Microbiology  none  Antimicrobials: None currently    Objective: Vitals:   08/08/21 0450 08/08/21 0453 08/08/21 0500 08/08/21 1145  BP:  94/61  (!) 94/58  Pulse: (!) 58 74  73  Resp: 18   18  Temp: (!) 97.5 F (36.4 C)   97.7 F (36.5 C)  TempSrc: Oral     SpO2: 98% 98%  100%  Weight:   96.4 kg   Height:        Intake/Output Summary (Last 24 hours) at 08/08/2021 1357 Last data filed at 08/08/2021 0619 Gross per 24 hour  Intake 360 ml  Output 1200 ml  Net -840 ml    Filed Weights   08/05/21 0529 08/06/21 0500 08/08/21 0500  Weight: 94.8 kg 96.3 kg 96.4 kg    Examination:  Constitutional: No distress, in bed, eating breakfast Eyes: Anicteric ENMT: Moist mucous membranes Neck: normal, supple Respiratory: Clear bilaterally, no wheezing, no crackles Cardiovascular: Regular rate and rhythm, trace edema Abdomen: Soft, NT, ND, bowel sounds positive Musculoskeletal: no clubbing / cyanosis.  Skin: No rashes seen Neurologic: No focal deficits   Data Reviewed: I have independently reviewed following labs and imaging studies  CBC: Recent Labs  Lab 08/04/21 0532 08/05/21 0500 08/06/21 0836 08/07/21 0559 08/08/21 0506  WBC 7.3 7.2 6.3 6.0 5.4  HGB 8.6* 8.4* 8.5* 7.9* 7.7*  HCT 27.8* 27.4* 27.9* 26.0* 25.4*  MCV 81.8 82.3 83.5 82.5 81.9  PLT 124* 124* 116* 110* 97*    Basic Metabolic Panel: Recent Labs  Lab 08/04/21 0532 08/05/21 0500  08/06/21 0836 08/07/21 0559 08/08/21 0506  NA 132* 133* 134* 134* 133*  K 3.9 3.9 3.8 3.7 3.8  CL 99 100 101 98 100  CO2 25 28 27 27 27   GLUCOSE 149* 209* 163* 178* 252*  BUN 25* 28* 25* 22 21  CREATININE 1.79* 1.79* 1.70* 1.53* 1.44*  CALCIUM 8.1* 8.2* 8.3* 8.1* 8.1*    Liver Function Tests: Recent Labs  Lab 08/04/21 0532 08/05/21 0500 08/06/21 0836 08/07/21 0559 08/08/21 0506  AST 279* 190* 133* 95* 77*  ALT 74* 78* 78* 71* 65*  ALKPHOS 207* 218* 209* 196* 185*  BILITOT 1.3* 1.1 1.2 1.2 1.0  PROT 5.1* 5.2* 5.4* 5.3* 5.0*  ALBUMIN 2.3* 2.4* 2.5* 2.4* 2.4*    Coagulation Profile: No results for input(s): INR, PROTIME in the last 168 hours. HbA1C: No results for input(s): HGBA1C in the last 72 hours. CBG: Recent Labs  Lab 08/07/21 1236 08/07/21 1625 08/07/21 2207 08/08/21 0752 08/08/21 1205  GLUCAP 200* 200* 187* 201* 215*     Recent Results (from the past 240 hour(s))  Resp Panel by RT-PCR (Flu A&B, Covid) Nasopharyngeal Swab     Status: None   Collection Time: 08/01/21  9:52 AM   Specimen: Nasopharyngeal Swab; Nasopharyngeal(NP) swabs in vial transport medium  Result Value Ref Range Status   SARS Coronavirus 2 by RT PCR NEGATIVE NEGATIVE Final    Comment: (NOTE) SARS-CoV-2 target nucleic acids are NOT DETECTED.  The SARS-CoV-2 RNA is generally detectable in upper respiratory specimens during the acute phase of infection. The lowest concentration of SARS-CoV-2 viral copies this assay can detect is 138 copies/mL. A negative result does not preclude SARS-Cov-2 infection and should not be used as the sole basis for treatment or other patient management decisions. A negative result may occur with  improper specimen collection/handling, submission of specimen other than nasopharyngeal swab, presence of viral mutation(s) within the areas targeted by this assay, and inadequate number of viral copies(<138 copies/mL). A negative result must be combined  with clinical observations, patient history, and epidemiological information. The expected result is Negative.  Fact Sheet for Patients:  EntrepreneurPulse.com.au  Fact Sheet for Healthcare Providers:  IncredibleEmployment.be  This test is no t yet approved or cleared by the [place] FDA and  has been authorized for detection and/or diagnosis of SARS-CoV-2 by FDA under an Emergency Use Authorization (EUA). This EUA will remain  in effect (meaning this test can be used) for the duration of the COVID-19 declaration under Section 564(b)(1) of the Act, 21 U.S.C.section 360bbb-3(b)(1), unless the authorization is terminated  or revoked sooner.       Influenza A by PCR NEGATIVE NEGATIVE Final   Influenza B by PCR NEGATIVE NEGATIVE Final    Comment: (NOTE) The Xpert Xpress SARS-CoV-2/FLU/RSV plus assay is intended as an aid in the diagnosis of influenza from Nasopharyngeal swab specimens and should not be used as a sole basis for treatment. Nasal washings and aspirates are unacceptable for Xpert Xpress SARS-CoV-2/FLU/RSV testing.  Fact Sheet for Patients: EntrepreneurPulse.com.au  Fact Sheet for Healthcare Providers: IncredibleEmployment.be  This test is not yet approved or cleared by the [place] FDA and has been authorized for detection and/or diagnosis of SARS-CoV-2 by FDA under an Emergency Use Authorization (EUA). This EUA will remain in effect (meaning this test can be used) for the duration of the COVID-19 declaration under Section 564(b)(1) of the Act, 21 U.S.C. section 360bbb-3(b)(1), unless the authorization is terminated or revoked.  Performed at [facility], [place] [address] [place], [place] [address]       Radiology Studies: No results found.   Marzetta Board, MD, PhD [facility]  Between 7 am - 7 pm I am available, please contact me via Amion (for  emergencies) or Securechat (non urgent messages)  Between 7 pm - 7 am I am not available, please contact night coverage MD/APP via Amion

## 2021-08-09 DIAGNOSIS — N183 Chronic kidney disease, stage 3 unspecified: Secondary | ICD-10-CM | POA: Diagnosis not present

## 2021-08-09 DIAGNOSIS — Z7401 Bed confinement status: Secondary | ICD-10-CM | POA: Diagnosis not present

## 2021-08-09 DIAGNOSIS — Z743 Need for continuous supervision: Secondary | ICD-10-CM | POA: Diagnosis not present

## 2021-08-09 DIAGNOSIS — M170 Bilateral primary osteoarthritis of knee: Secondary | ICD-10-CM | POA: Diagnosis not present

## 2021-08-09 DIAGNOSIS — M6281 Muscle weakness (generalized): Secondary | ICD-10-CM | POA: Diagnosis not present

## 2021-08-09 DIAGNOSIS — R2689 Other abnormalities of gait and mobility: Secondary | ICD-10-CM | POA: Diagnosis not present

## 2021-08-09 DIAGNOSIS — D696 Thrombocytopenia, unspecified: Secondary | ICD-10-CM | POA: Diagnosis not present

## 2021-08-09 DIAGNOSIS — M2681 Anterior soft tissue impingement: Secondary | ICD-10-CM | POA: Diagnosis not present

## 2021-08-09 DIAGNOSIS — M1990 Unspecified osteoarthritis, unspecified site: Secondary | ICD-10-CM | POA: Diagnosis not present

## 2021-08-09 DIAGNOSIS — R531 Weakness: Secondary | ICD-10-CM | POA: Diagnosis not present

## 2021-08-09 DIAGNOSIS — E1122 Type 2 diabetes mellitus with diabetic chronic kidney disease: Secondary | ICD-10-CM | POA: Diagnosis not present

## 2021-08-09 DIAGNOSIS — K922 Gastrointestinal hemorrhage, unspecified: Secondary | ICD-10-CM | POA: Diagnosis not present

## 2021-08-09 DIAGNOSIS — R1312 Dysphagia, oropharyngeal phase: Secondary | ICD-10-CM | POA: Diagnosis not present

## 2021-08-09 DIAGNOSIS — I509 Heart failure, unspecified: Secondary | ICD-10-CM | POA: Diagnosis not present

## 2021-08-09 DIAGNOSIS — K8050 Calculus of bile duct without cholangitis or cholecystitis without obstruction: Secondary | ICD-10-CM | POA: Diagnosis not present

## 2021-08-09 DIAGNOSIS — K851 Biliary acute pancreatitis without necrosis or infection: Secondary | ICD-10-CM | POA: Diagnosis not present

## 2021-08-09 DIAGNOSIS — E0800 Diabetes mellitus due to underlying condition with hyperosmolarity without nonketotic hyperglycemic-hyperosmolar coma (NKHHC): Secondary | ICD-10-CM | POA: Diagnosis not present

## 2021-08-09 DIAGNOSIS — I5022 Chronic systolic (congestive) heart failure: Secondary | ICD-10-CM | POA: Diagnosis not present

## 2021-08-09 DIAGNOSIS — E119 Type 2 diabetes mellitus without complications: Secondary | ICD-10-CM | POA: Diagnosis not present

## 2021-08-09 DIAGNOSIS — I10 Essential (primary) hypertension: Secondary | ICD-10-CM | POA: Diagnosis not present

## 2021-08-09 DIAGNOSIS — I2510 Atherosclerotic heart disease of native coronary artery without angina pectoris: Secondary | ICD-10-CM | POA: Diagnosis not present

## 2021-08-09 DIAGNOSIS — I480 Paroxysmal atrial fibrillation: Secondary | ICD-10-CM | POA: Diagnosis not present

## 2021-08-09 DIAGNOSIS — K810 Acute cholecystitis: Secondary | ICD-10-CM | POA: Diagnosis not present

## 2021-08-09 DIAGNOSIS — E782 Mixed hyperlipidemia: Secondary | ICD-10-CM | POA: Diagnosis not present

## 2021-08-09 DIAGNOSIS — M109 Gout, unspecified: Secondary | ICD-10-CM | POA: Diagnosis not present

## 2021-08-09 LAB — RESP PANEL BY RT-PCR (FLU A&B, COVID) ARPGX2
Influenza A by PCR: NEGATIVE
Influenza B by PCR: NEGATIVE
SARS Coronavirus 2 by RT PCR: NEGATIVE

## 2021-08-09 LAB — BPAM RBC
Blood Product Expiration Date: 202210182359
ISSUE DATE / TIME: 202209191513
Unit Type and Rh: 5100

## 2021-08-09 LAB — GLUCOSE, CAPILLARY
Glucose-Capillary: 149 mg/dL — ABNORMAL HIGH (ref 70–99)
Glucose-Capillary: 170 mg/dL — ABNORMAL HIGH (ref 70–99)
Glucose-Capillary: 203 mg/dL — ABNORMAL HIGH (ref 70–99)
Glucose-Capillary: 233 mg/dL — ABNORMAL HIGH (ref 70–99)

## 2021-08-09 LAB — TYPE AND SCREEN
ABO/RH(D): O POS
Antibody Screen: NEGATIVE
Unit division: 0

## 2021-08-09 LAB — CBC
HCT: 29.8 % — ABNORMAL LOW (ref 39.0–52.0)
Hemoglobin: 9.3 g/dL — ABNORMAL LOW (ref 13.0–17.0)
MCH: 25.5 pg — ABNORMAL LOW (ref 26.0–34.0)
MCHC: 31.2 g/dL (ref 30.0–36.0)
MCV: 81.6 fL (ref 80.0–100.0)
Platelets: 95 10*3/uL — ABNORMAL LOW (ref 150–400)
RBC: 3.65 MIL/uL — ABNORMAL LOW (ref 4.22–5.81)
RDW: 19.8 % — ABNORMAL HIGH (ref 11.5–15.5)
WBC: 4.9 10*3/uL (ref 4.0–10.5)
nRBC: 0 % (ref 0.0–0.2)

## 2021-08-09 LAB — COMPREHENSIVE METABOLIC PANEL
ALT: 69 U/L — ABNORMAL HIGH (ref 0–44)
AST: 74 U/L — ABNORMAL HIGH (ref 15–41)
Albumin: 2.6 g/dL — ABNORMAL LOW (ref 3.5–5.0)
Alkaline Phosphatase: 179 U/L — ABNORMAL HIGH (ref 38–126)
Anion gap: 11 (ref 5–15)
BUN: 20 mg/dL (ref 8–23)
CO2: 28 mmol/L (ref 22–32)
Calcium: 8.3 mg/dL — ABNORMAL LOW (ref 8.9–10.3)
Chloride: 98 mmol/L (ref 98–111)
Creatinine, Ser: 1.4 mg/dL — ABNORMAL HIGH (ref 0.61–1.24)
GFR, Estimated: 55 mL/min — ABNORMAL LOW (ref >60)
Glucose, Bld: 156 mg/dL — ABNORMAL HIGH (ref 70–99)
Potassium: 3.7 mmol/L (ref 3.5–5.1)
Sodium: 137 mmol/L (ref 135–145)
Total Bilirubin: 1 mg/dL (ref 0.3–1.2)
Total Protein: 5.5 g/dL — ABNORMAL LOW (ref 6.5–8.1)

## 2021-08-09 NOTE — Discharge Summary (Signed)
Physician Discharge Summary  [name] [name] HKV:[id number] DOB: 1951-06-19 DOA: 07/17/2021  PCP: Raeanne Gathers, MD  Admit date: 07/17/2021 Discharge date: 08/09/2021  Admitted From: home Disposition:  SNF  Recommendations for Outpatient Follow-up:  Follow up with PCP in 1-2 weeks Please obtain BMP/CBC in one week  Home Health: none Equipment/Devices: none  Discharge Condition: stable CODE STATUS: DNR Diet recommendation: regular  HPI: Per admitting MD, [name] [name] is a 70 y.o. male with medical history significant for DMT2, Chronic systolic CHF, CKD 3, PAF, CAD, HTN, OA who presents for evaluation of right upper abdominal pain.  Reports he has had pain for the last 3 days and has progressively gotten worse.  He is also had some mild intermittent diarrhea as well as mild nausea but no vomiting with his symptoms.  He states he has not had any fevers or chills.  He has had decreased appetite and p.o. intake secondary to not feeling well and stomach pain.  States he has never had abdominal pain like this in the past.  He has had a hernia repair but no other abdominal surgeries.  He denies any chest pain, palpitations, shortness of breath.  He states that the pain will radiate around to his right flank region.  Not had any abdominal injury or trauma to his back.  He denies any dysuria or urinary hesitancy or frequency. He is widowed and lives alone.  Denies tobacco, alcohol, illicit drug use.  Hospital Course / Discharge diagnoses: Principal Problem Acute cholecystitis/choledocholithiasis/pain biliary pancreatitis - GI and general surgery consulted on admission.  S/p ERCP showing choledocholithiasis underwent stone removal with sphincterotomy.  Cholecystectomy was scheduled however it was canceled as cardiology could not clear him for surgery.  General surgery has signed off and recommended outpatient follow-up in the office.  Patient was treated empirically with antibiotics and  finished a course while hospitalized. Pt reports occasional intermittent abd pain, resolves spontaneously, and repeat CT scan showed extensive pneumobilia is noted in the left hepatic ducts as well as in the common bile and cystic ducts. Large amount of air is also noted in the gallbladder lumen. This most likely is due to recent instrumentation.  He had a transient elevation of LFTs on 9/14 GI was reconsulted, underwent a repeat MRI/MRCP showing mild debris in the CBD, possible 4 mm stone.  Subsequently he underwent repeat ERCP on 9/16 which was negative, suggesting that there may have been mild sludge/small stone that has passed.  LFTs continue to improve   Active Problems Mild acute on chronic combined systolic and diastolic heart failure -Echocardiogram showed left ventricular ejection fraction of 20 to 25% with severe right ventricular dysfunction. Cardiology on board and has ordered a PYP scan as outpatient to evaluate for amyloid, SPEP, UPEP and light chains have been ordered. Recommend outpatient follow up. He was initially started on IV Lasix for fluid overload but has been discontinued due to worsening renal parameters.  His renal function has stabilized now and he was resumed on his oral Lasix which she is tolerating well with stable creatinine.  Mild thrombocytopenia -possibly consumptive, platelets are stable today.  There is a chronic component to this as he has been thrombocytopenic as far back as 2017 Lower GI bleed in the setting of C. difficile colitis, hemorrhagic shock-Patient was on Eliquis for anticoagulation for atrial fibrillation which was reversed with Kcentra when he started having GI bleed.  GI consulted patient underwent EGD was found to have a bleeding all vessel  at the sphincterotomy site underwent Endo clipping of the bleeding vessel.  Patient was transferred to ICU for hemorrhagic shock and was on Levophed briefly.  Patient currently off all pressors and clinically stable.  He  has no evidence of bleeding however hemoglobin trending down to 7.7 status post 1 unit of packed red blood cells.  He has not had any further bleeding, has had normal bowel movements.  His hemoglobin has remained stable and improving on its own C. difficile diarrhea/ COLITIS-Resolved. Completed the course of oral vancomycin.  Now constipated DM2 -Hemoglobin A1c is 6.6%.No changes in meds.  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  Currently at baseline, creatinine 1.4 this morning, continue oral Lasix  Paroxysmal atrial fibrillation -Rate controlled, resumed Eliquis post ERCP, no further bleeding Coronary artery disease s/p NSTEMI in 2008 s/p stent to RCA and LAD Continue with statin at this time Mild acute encephalopathy - Probably due to acute illness. Pt appears to be at baseline. MRI brain is negative for acute stroke.  Failure to thrive, poor oral intake-Therapy evaluations are recommending SNF  Sepsis ruled out   Discharge Instructions   Allergies as of 08/09/2021       Reactions   Allopurinol Other (See Comments)   Felt terrible   Aripiprazole Other (See Comments)   Felt terrible   Bupropion Other (See Comments)   Felt bad   Buspirone Other (See Comments)   Felt bad   Desvenlafaxine Other (See Comments)   Made him hyper   Duloxetine Other (See Comments)   Knocked him out   Escitalopram Other (See Comments)   Felt like crap   Fish Oil    unknown   Fluticasone Furoate Other (See Comments)   Unknown    Hydroxyzine Hcl Other (See Comments)   Knocked him out   Nortriptyline Other (See Comments)   Ineffective   Paroxetine Hcl Other (See Comments)   unknown   Ramipril Cough   Sertraline Other (See Comments)   "felt plugged in"   Venlafaxine Other (See Comments)   Felt bad   Vilazodone Other (See Comments)   "felt weird"        Medication List     STOP taking these medications     Cialis 20 MG tablet Generic drug: tadalafil   spironolactone 25 MG tablet Commonly known as: ALDACTONE       TAKE these medications    albuterol 108 (90 Base) MCG/ACT inhaler Commonly known as: VENTOLIN HFA Inhale 2 puffs into the lungs every 6 (six) hours as needed for shortness of breath.   ALPRAZolam 0.25 MG tablet Commonly known as: XANAX Take 1 tablet (0.25 mg total) by mouth at bedtime as needed for anxiety.   apixaban 5 MG Tabs tablet Commonly known as: ELIQUIS Take 1 tablet (5 mg total) by mouth 2 (two) times daily.   colchicine 0.6 MG tablet Take 0.6 mg by mouth daily as needed for other. Gout flare up   cyanocobalamin 1000 MCG/ML injection Commonly known as: (VITAMIN B-12) Inject 1,000 mcg into the muscle every 30 (thirty) days.   dapagliflozin propanediol 10 MG Tabs tablet Commonly known as: FARXIGA Take 1 tablet (10 mg total) by mouth at bedtime.   fluticasone 50 MCG/ACT nasal spray Commonly known as: FLONASE Place 1 spray into both nostrils daily as needed for allergies or rhinitis.   furosemide 40 MG tablet Commonly known as: LASIX Take 1  tablet (40 mg total) by mouth daily.   Lantus SoloStar 100 UNIT/ML Solostar Pen Generic drug: insulin glargine Inject 18 Units into the skin at bedtime.   levalbuterol 45 MCG/ACT inhaler Commonly known as: XOPENEX HFA Inhale 1 puff into the lungs every 4 (four) hours as needed for wheezing.   potassium chloride SA 20 MEQ tablet Commonly known as: KLOR-CON Take 1 tablet (20 mEq total) by mouth daily.   rosuvastatin 40 MG tablet Commonly known as: CRESTOR Take 1 tablet (40 mg total) by mouth at bedtime.   traMADol 50 MG tablet Commonly known as: ULTRAM Take 0.5 tablets (25 mg total) by mouth every 4 (four) hours as needed. What changed: reasons to take this        Follow-up Information     Raeanne Gathers, MD. Schedule an appointment as soon as possible for a visit in 2 days.   Specialty: Family  Medicine Why: Follow up from ER visit Contact information: [place] [address] [phone]         [place] DEPT. Go to .   Specialty: Emergency Medicine Why: As needed Contact information: [place] [id number] [place] [phone]        Sur[facility], [place]. Schedule an appointment as soon as possible for a visit in 6 month(s).   Specialty: General Surgery Why: Schedule an appointment to discuss elective cholecystectomy if cleared by cardiology to undergo surgery. Contact information: [address] [place] [address] [phone]         [facility] Follow up on 07/26/2021.   Specialty: Cardiology Why: 2:30PM. Heart failure clinic follow up Contact information: [address] [id number] [place] [phone]                Consultations: GI  Procedures/Studies: ERCP x 2  CT ABDOMEN PELVIS WO CONTRAST  Result Date: 07/28/2021 CLINICAL DATA:  Acute generalized abdominal pain.  Elevated lipase. EXAM: CT ABDOMEN AND PELVIS WITHOUT CONTRAST TECHNIQUE: Multidetector CT imaging of the abdomen and pelvis was performed following the standard protocol without IV contrast. COMPARISON:  July 17, 2021. FINDINGS: Lower chest: Small bilateral pleural effusions are noted with adjacent subsegmental atelectasis. Hepatobiliary: No focal abnormality is noted in the liver on these unenhanced images. Left hepatic pneumobilia is noted as well as air noted in common bile duct and cystic duct. Large amount of air is also noted in the gallbladder lumen. Moderate gallbladder wall thickening is noted without cholelithiasis, concerning for cholecystitis. There appears to be a biliary stent in the second portion of the duodenum. Pancreas: Unremarkable. No pancreatic ductal dilatation or surrounding  inflammatory changes. Spleen: Normal in size without focal abnormality. Adrenals/Urinary Tract: Adrenal glands appear normal. Nonobstructive right renal calculus is noted. No hydronephrosis or renal obstruction is noted. Urinary bladder is unremarkable. Stomach/Bowel: Stomach is within normal limits. Appendix appears normal. No evidence of bowel wall thickening, distention, or inflammatory changes. Vascular/Lymphatic: Aortic atherosclerosis. No enlarged abdominal or pelvic lymph nodes. Reproductive: Prostate is unremarkable. Other: Minimal ascites is noted.  No definite hernia is noted. Musculoskeletal: No acute or significant osseous findings. IMPRESSION: Small bilateral pleural effusions are noted with adjacent subsegmental atelectasis. Extensive pneumobilia is noted in the left hepatic ducts as well as in the common bile and cystic ducts. Large amount of air is also noted in the gallbladder lumen. This most likely is due to recent instrumentation.  Moderate gallbladder wall thickening is noted without cholelithiasis concerning for cholecystitis. Nonobstructive right renal calculus. Minimal ascites. Aortic Atherosclerosis (ICD10-I70.0). Electronically Signed   By: Marijo Conception M.D.   On: 07/28/2021 19:57   CT ABDOMEN PELVIS WO CONTRAST  Result Date: 07/18/2021 CLINICAL DATA:  Abdominal pain x3 days. EXAM: CT ABDOMEN AND PELVIS WITHOUT CONTRAST TECHNIQUE: Multidetector CT imaging of the abdomen and pelvis was performed following the standard protocol without IV contrast. COMPARISON:  None. FINDINGS: Lower chest: Moderate severity atelectasis and/or infiltrate is seen within the posterior aspects of the bilateral lung bases. There are small bilateral pleural effusions. Hepatobiliary: No focal liver abnormality is seen. The gallbladder is markedly distended with diffuse gallbladder wall thickening and pericholecystic inflammation. A 2 mm gallstone is suspected within the distal common bile duct (axial CT image  39, CT series 2). Pancreas: Mild inflammatory fat stranding is seen along the posterior aspect of the tail of the pancreas. The pancreatic parenchyma is otherwise normal in appearance. Spleen: Normal in size without focal abnormality. Adrenals/Urinary Tract: Adrenal glands are unremarkable. Kidneys are normal in size, without obstructing renal calculi or hydronephrosis. A 9 mm nonobstructing renal stone is seen within the mid right kidney. A 1.4 cm diameter cyst is seen within the anterior aspect of the mid right kidney. Bladder is unremarkable. Stomach/Bowel: Stomach is within normal limits. Appendix appears normal. No evidence of bowel wall thickening, distention, or inflammatory changes. Vascular/Lymphatic: Aortic atherosclerosis. No enlarged abdominal or pelvic lymph nodes. Reproductive: Prostate is unremarkable. Other: No abdominal wall hernia or abnormality. A small amount of abdominal and pelvic free fluid is seen with diffuse nonspecific mesenteric inflammatory fat stranding noted. Musculoskeletal: Degenerative changes are seen within the lumbar spine, most prominent at the level of L5-S1. IMPRESSION: 1. Findings consistent with acute cholecystitis with a tiny gallstone noted within the distal common bile duct. 2. Mild acute pancreatitis along the tail of the pancreas can not be excluded. Correlation with pancreatic enzymes is recommended. 3. 9 mm nonobstructing renal stone within the right kidney. Electronically Signed   By: Virgina Norfolk M.D.   On: 07/18/2021 00:02   DG Chest 2 View  Result Date: 07/12/2021 CLINICAL DATA:  Shortness of breath EXAM: CHEST - 2 VIEW COMPARISON:  05/03/2021 FINDINGS: Heart is mildly enlarged. No confluent opacities or edema. Small left pleural effusion. No acute bony abnormality. IMPRESSION: Cardiomegaly.  Small left pleural effusion. Electronically Signed   By: Rolm Baptise M.D.   On: 07/12/2021 22:53   CT HEAD WO CONTRAST (5MM)  Result Date: 07/22/2021 CLINICAL  DATA:  Mental status change.  Unknown cause. EXAM: CT HEAD WITHOUT CONTRAST TECHNIQUE: Contiguous axial images were obtained from the base of the skull through the vertex without intravenous contrast. COMPARISON:  CT head 11/04/2018. FINDINGS: Brain: There is no evidence of acute intracranial hemorrhage, mass lesion, brain edema or extra-axial fluid collection. Stable mild atrophy with prominence of the ventricles and subarachnoid spaces. There are mild chronic small vessel ischemic changes in the periventricular white matter. There is no CT evidence of acute cortical infarction. Vascular: Intracranial vascular calcifications. No hyperdense vessel identified. Skull: Negative for fracture or focal lesion. Sinuses/Orbits: The visualized paranasal sinuses and mastoid air cells are clear. No orbital abnormalities are seen. Other: None. IMPRESSION: 1. Stable head CT without acute intracranial findings. 2. Mild atrophy and chronic small vessel ischemic changes. Electronically Signed   By: Richardean Sale M.D.   On: 07/22/2021 17:53   MR BRAIN WO CONTRAST  Result  Date: 07/29/2021 CLINICAL DATA:  Initial evaluation for mental status change. EXAM: MRI HEAD WITHOUT CONTRAST TECHNIQUE: Multiplanar, multiecho pulse sequences of the brain and surrounding structures were obtained without intravenous contrast. COMPARISON:  Head CT from 07/22/2021. FINDINGS: Brain: Diffuse prominence of the CSF containing spaces compatible with generalized cerebral atrophy. No significant cerebral white matter disease for age. No abnormal foci of restricted diffusion to suggest acute or subacute ischemia. Gray-white matter differentiation maintained. No encephalomalacia to suggest chronic cortical infarction. No acute intracranial hemorrhage. Single punctate chronic microhemorrhage noted within the right frontal centrum semi ovale, of doubtful significance in isolation. No mass lesion, midline shift or mass effect. Mild ventricular prominence  related to global parenchymal volume loss without hydrocephalus. No extra-axial fluid collection. Pituitary gland suprasellar region normal. Midline structures intact and normal. Vascular: Right vertebral artery hypoplastic and not well seen. Major intracranial vascular flow voids otherwise maintained. Skull and upper cervical spine: Craniocervical junction within normal limits. Bone marrow signal intensity within normal limits. No scalp soft tissue abnormality. Sinuses/Orbits: Globes and orbital soft tissues demonstrate no acute finding. Paranasal sinuses are largely clear. No significant mastoid effusion. Inner ear structures grossly normal. Other: None. IMPRESSION: 1. No acute intracranial abnormality. 2. Mildly advanced cerebral atrophy for age. Otherwise unremarkable brain MRI. Electronically Signed   By: Jeannine Boga M.D.   On: 07/29/2021 00:30   MR 3D Recon At Scanner  Result Date: 08/03/2021 CLINICAL DATA:  Abdominal pain, evaluate for cholelithiasis EXAM: MRI ABDOMEN WITHOUT AND WITH CONTRAST (INCLUDING MRCP) TECHNIQUE: Multiplanar multisequence MR imaging of the abdomen was performed both before and after the administration of intravenous contrast. Heavily T2-weighted images of the biliary and pancreatic ducts were obtained, and three-dimensional MRCP images were rendered by post processing. CONTRAST:  64mL GADAVIST GADOBUTROL 1 MMOL/ML IV SOLN COMPARISON:  CT abdomen/pelvis dated 07/28/2021 FINDINGS: Blooming/susceptibility artifact in the right mid abdomen related to a surgical clip in the duodenum at the ampulla on CT. Lower chest: Moderate left and small right pleural effusions. Associated lower lobe atelectasis. Hepatobiliary: Liver is within normal limits. No suspicious/enhancing hepatic lesions. Mild gallbladder wall thickening, particularly in the gallbladder neck (series 4/image 21). Intraluminal gas related to recent sphincterotomy (series 4/image 19). No pericholecystic inflammatory  changes. No intrahepatic ductal dilatation. Common duct measures 7 mm. Mild debris is suspected in the common duct, including a possible 4 mm distal CBD stone (series 6/image 15), although equivocal. Pancreas: Partially obscured by artifact but otherwise within normal limits. Spleen:  Within normal limits. Adrenals/Urinary Tract:  Adrenal glands are within normal limits. Right kidney is partially obscured by artifact but otherwise within normal limits. Left kidney is within normal limits. No hydronephrosis. Stomach/Bowel: Stomach is within normal limits. Visualized bowel is grossly unremarkable, noting artifact obscuring the proximal duodenum. Vascular/Lymphatic:  No evidence of abdominal aortic aneurysm. No suspicious abdominal lymphadenopathy. Other:  No abdominal ascites. Musculoskeletal: No focal osseous lesions. IMPRESSION: Blooming/subpleural artifact in the right mid abdomen related to a surgical clip in the duodenum at the ampulla. Common duct measures 7 mm. Mild debris is suspected in the common duct, including a possible 4 mm distal CBD stone, although equivocal. Mild gallbladder wall thickening, although without pericholecystic inflammatory changes. Intraluminal gas, likely related to recent sphincterotomy. Moderate left and small right pleural effusions. Electronically Signed   By: Julian Hy M.D.   On: 08/03/2021 20:53   DG Chest Port 1 View  Result Date: 07/19/2021 CLINICAL DATA:  Respiratory distress. EXAM: PORTABLE CHEST 1 VIEW COMPARISON:  July 18, 2021 FINDINGS: Mild, stable areas of linear scarring and/or atelectasis are seen within the bilateral lung bases. There is no evidence of a pleural effusion or pneumothorax. The cardiac silhouette is enlarged and unchanged in size. The visualized skeletal structures are unremarkable. IMPRESSION: Mild, stable bibasilar linear scarring and/or atelectasis. Electronically Signed   By: Virgina Norfolk M.D.   On: 07/19/2021 17:07   DG CHEST  PORT 1 VIEW  Result Date: 07/18/2021 CLINICAL DATA:  Weakness EXAM: PORTABLE CHEST 1 VIEW COMPARISON:  07/12/2021 FINDINGS: Cardiac shadow is enlarged but stable. Increasing right basilar atelectasis is noted. Left lung remains clear. No bony abnormality is noted. IMPRESSION: Increasing right basilar atelectasis. Electronically Signed   By: Inez Catalina M.D.   On: 07/18/2021 14:36   DG ERCP  Result Date: 07/19/2021 CLINICAL DATA:  Gallstone pancreatitis EXAM: ERCP TECHNIQUE: Multiple spot images obtained with the fluoroscopic device and submitted for interpretation post-procedure. FLUOROSCOPY TIME:  Fluoroscopy Time:  5 minutes 52 seconds Number of Acquired Spot Images: 0 COMPARISON:  CT abdomen/pelvis 07/17/2021 FINDINGS: A total of 22 intraoperative spot images are submitted for review. The images demonstrate a flexible duodenal scope in the descending duodenum with wire cannulation of the main pancreatic duct and common bile duct. Subsequent images demonstrate cholangiography, sphincterotomy and balloon sweeping of the common duct. IMPRESSION: ERCP with sphincterotomy and balloon sweeping of the common duct. These images were submitted for radiologic interpretation only. Please see the procedural report for the amount of contrast and the fluoroscopy time utilized. Electronically Signed   By: Jacqulynn Cadet M.D.   On: 07/19/2021 15:11   DG ERCP BILIARY & PANCREATIC DUCTS  Result Date: 08/05/2021 CLINICAL DATA:  Abdominal pain, possible choledocholithiasis on MRCP EXAM: ERCP TECHNIQUE: Multiple spot images obtained with the fluoroscopic device and submitted for interpretation post-procedure. COMPARISON:  MRCP from 08/03/2021 FINDINGS: Series of fluoroscopic spot images document endoscopic cannulation opacification of the CBD with subsequent passage of a balloon catheter through the length of the CBD. Contrast fills the cystic duct and partially opacifies the nondilated gallbladder. There is incomplete  opacification of the intrahepatic biliary tree which appears decompressed centrally. IMPRESSION: Endoscopic CBD cannulation and intervention as above. These images were submitted for radiologic interpretation only. Please see the procedural report for the amount of contrast and the fluoroscopy time utilized. Electronically Signed   By: Lucrezia Europe M.D.   On: 08/05/2021 15:01   MR ABDOMEN MRCP W WO CONTAST  Result Date: 08/03/2021 CLINICAL DATA:  Abdominal pain, evaluate for cholelithiasis EXAM: MRI ABDOMEN WITHOUT AND WITH CONTRAST (INCLUDING MRCP) TECHNIQUE: Multiplanar multisequence MR imaging of the abdomen was performed both before and after the administration of intravenous contrast. Heavily T2-weighted images of the biliary and pancreatic ducts were obtained, and three-dimensional MRCP images were rendered by post processing. CONTRAST:  24mL GADAVIST GADOBUTROL 1 MMOL/ML IV SOLN COMPARISON:  CT abdomen/pelvis dated 07/28/2021 FINDINGS: Blooming/susceptibility artifact in the right mid abdomen related to a surgical clip in the duodenum at the ampulla on CT. Lower chest: Moderate left and small right pleural effusions. Associated lower lobe atelectasis. Hepatobiliary: Liver is within normal limits. No suspicious/enhancing hepatic lesions. Mild gallbladder wall thickening, particularly in the gallbladder neck (series 4/image 21). Intraluminal gas related to recent sphincterotomy (series 4/image 19). No pericholecystic inflammatory changes. No intrahepatic ductal dilatation. Common duct measures 7 mm. Mild debris is suspected in the common duct, including a possible 4 mm distal CBD stone (series 6/image 15), although equivocal. Pancreas: Partially obscured by  artifact but otherwise within normal limits. Spleen:  Within normal limits. Adrenals/Urinary Tract:  Adrenal glands are within normal limits. Right kidney is partially obscured by artifact but otherwise within normal limits. Left kidney is within normal  limits. No hydronephrosis. Stomach/Bowel: Stomach is within normal limits. Visualized bowel is grossly unremarkable, noting artifact obscuring the proximal duodenum. Vascular/Lymphatic:  No evidence of abdominal aortic aneurysm. No suspicious abdominal lymphadenopathy. Other:  No abdominal ascites. Musculoskeletal: No focal osseous lesions. IMPRESSION: Blooming/subpleural artifact in the right mid abdomen related to a surgical clip in the duodenum at the ampulla. Common duct measures 7 mm. Mild debris is suspected in the common duct, including a possible 4 mm distal CBD stone, although equivocal. Mild gallbladder wall thickening, although without pericholecystic inflammatory changes. Intraluminal gas, likely related to recent sphincterotomy. Moderate left and small right pleural effusions. Electronically Signed   By: Julian Hy M.D.   On: 08/03/2021 20:53   ECHOCARDIOGRAM LIMITED  Result Date: 07/19/2021    ECHOCARDIOGRAM LIMITED REPORT   Patient Name:   DEMORIO SEELEY Date of Exam: 07/19/2021 Medical Rec #:  [id number]          Height:       73.0 in Accession #:    [id number]         Weight:       219.1 lb Date of Birth:  May 02, 1951          BSA:          2.237 m Patient Age:    45 years           BP:           114/85 mmHg Patient Gender: M                  HR:           92 bpm. Exam Location:  Inpatient Procedure: Limited Echo, Limited Color Doppler and Cardiac Doppler Indications:    R94.31 Abnormal EKG  History:        Patient has prior history of Echocardiogram examinations, most                 recent 05/06/2021. CHF and Idiopathic CMP, Previous Myocardial                 Infarction and CAD, Arrythmias:Atrial Fibrillation; Risk                 Factors:Diabetes, Hypertension, Former Smoker and Dyslipidemia.  Sonographer:    Bernadene Person RDCS Referring Phys: [place]  1. 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 Left ventricular ejection fraction, by estimation, is 20 to 25%. The left ventricle has severely decreased function. The left ventricle demonstrates global hypokinesis. There is moderate concentric left ventricular hypertrophy. Left ventricular diastolic function could not be evaluated. There is the interventricular septum is flattened in systole and diastole, consistent with right ventricular pressure and volume overload. There is global hypokinesis with disporportionately severe inferior and inferolateral hypokinesis, consistent with scar in the right coronary artery territory.  2. Right ventricular systolic function is severely reduced. Tricuspid regurgitation signal is inadequate for assessing PA pressure.  3. Left atrial size was mild to moderately dilated.  4. Right atrial size was severely dilated.  5. The pericardial effusion is localized near the right atrium.  6. The mitral valve is normal in structure. No evidence of mitral valve  regurgitation. No evidence of mitral stenosis.  7. The aortic valve is normal in structure. There is mild thickening of the aortic valve. Aortic valve regurgitation is not visualized. Mild aortic valve sclerosis is present, with no evidence of aortic valve stenosis.  8. The inferior vena cava is dilated in size with <50% respiratory variability, suggesting right atrial pressure of 15 mmHg. Comparison(s): The rhythm is now atrial fibrillation. Otherwise, No significant change from prior study. Prior images reviewed side by side. 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 There is evidence of severely reduced cardiac output. 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 There is  superimposed ischemic cardiomyopathy. Left ventricular ejection fraction, by estimation, is 20 to 25%. The  left ventricle has severely decreased function. The left ventricle demonstrates global hypokinesis. There is moderate concentric left ventricular hypertrophy. The interventricular septum is flattened in systole and diastole, consistent with right ventricular pressure and volume overload. Left ventricular diastolic function could not be evaluated. Left ventricular diastolic function could not be evaluated due to atrial fibrillation.  LV Wall Scoring: The inferior wall, mid inferoseptal segment, and basal inferoseptal segment are akinetic. The entire anterior wall, entire lateral wall, entire anterior septum, and entire apex are hypokinetic. There is global hypokinesis with disporportionately severe inferior and inferolateral hypokinesis, consistent with scar in the right coronary artery territory. Right Ventricle: Right ventricular systolic function is severely reduced. Tricuspid regurgitation signal is inadequate for assessing PA pressure. Left Atrium: Left atrial size was mild to moderately dilated. Right Atrium: Right atrial size was severely dilated. Pericardium: Trivial pericardial effusion is present. The pericardial effusion is localized near the right atrium. Mitral Valve: The mitral valve is normal in structure. No evidence of mitral valve stenosis. Tricuspid Valve: Tricuspid valve regurgitation is mild. Aortic Valve: The aortic valve is normal in structure. There is mild thickening of the aortic valve. Aortic valve regurgitation is not visualized. Mild aortic valve sclerosis is present, with no evidence of aortic valve stenosis. Pulmonic Valve: The pulmonic valve was grossly normal. Pulmonic valve regurgitation is not visualized. Venous: The inferior vena cava is dilated in size with less than 50% respiratory variability, suggesting right atrial pressure of 15 mmHg. Additional Comments: There is  pleural effusion in the left lateral region. Moderate ascites is present. LEFT VENTRICLE PLAX 2D LVIDd:         5.00 cm LVIDs:         4.40 cm LV PW:         1.60 cm LV IVS:        1.70 cm LVOT diam:     2.10 cm LV SV:         27 LV SV Index:   12 LVOT Area:     3.46 cm  LV Volumes (MOD) LV vol d, MOD A2C: 151.0 ml LV vol d, MOD A4C: 152.0 ml LV vol s, MOD A2C: 119.0 ml LV vol s, MOD A4C: 105.0 ml LV SV MOD A2C:     32.0 ml LV SV MOD A4C:     47.0 ml LV SV MOD BP:      41.3 ml RIGHT VENTRICLE TAPSE (M-mode): 1.0 cm LEFT ATRIUM           Index       RIGHT ATRIUM           Index LA diam:  4.30 cm 1.92 cm/m  RA Area:     30.76 cm 13.75 cm/m LA Vol (A4C): 57.8 ml 25.82 ml/m RA Volume:   120.58 ml 53.91 ml/m  AORTIC VALVE LVOT Vmax:   61.17 cm/s LVOT Vmean:  35.800 cm/s LVOT VTI:    0.078 m  AORTA Ao Root diam: 3.70 cm  SHUNTS Systemic VTI:  0.08 m Systemic Diam: 2.10 cm Dani Gobble Croitoru MD Electronically signed by Sanda Klein MD Signature Date/Time: 07/19/2021/1:09:52 PM    Final    US Abdomen Limited RUQ (LIVER/GB)  Result Date: 07/17/2021 CLINICAL DATA:  Right upper quadrant pain for 4 days. EXAM: ULTRASOUND ABDOMEN LIMITED RIGHT UPPER QUADRANT COMPARISON:  None. FINDINGS: Gallbladder: The gallbladder wall is thickened and mildly irregular. Sludge is identified. At least one 6 mm stone is noted. Pericholecystic fluid is identified. No Murphy's sign reported. Common bile duct: Diameter: 3 mm Liver: No focal lesion identified. Within normal limits in parenchymal echogenicity. Portal vein is patent on color Doppler imaging with normal direction of blood flow towards the liver. Other: None. IMPRESSION: 1. The gallbladder is abnormal in appearance with a mildly irregular thickened wall. There is sludge in the gallbladder as well as at least 1 stone. Pericholecystic fluid is noted. No Murphy's sign. If the clinical picture remains ambiguous, recommend a HIDA scan. 2. A small amount of free fluid is seen in  the abdomen. 3. No other acute abnormalities. Electronically Signed   By: Dorise Bullion III M.D.   On: 07/17/2021 21:01     Subjective: - no chest pain, shortness of breath, no abdominal pain, nausea or vomiting.   Discharge Exam: BP 99/62 (BP Location: Left Arm)   Pulse 67   Temp (!) 97.5 F (36.4 C) (Oral)   Resp 16   Ht 6\' 1"  (1.854 m)   Wt 96.4 kg   SpO2 100%   BMI 28.04 kg/m   General: Pt is alert, awake, not in acute distress Cardiovascular: RRR, S1/S2 +, no rubs, no gallops Respiratory: CTA bilaterally, no wheezing, no rhonchi Abdominal: Soft, NT, ND, bowel sounds + Extremities: no edema, no cyanosis    The results of significant diagnostics from this hospitalization (including imaging, microbiology, ancillary and laboratory) are listed below for reference.     Microbiology: Recent Results (from the past 240 hour(s))  Resp Panel by RT-PCR (Flu A&B, Covid) Nasopharyngeal Swab     Status: None   Collection Time: 08/01/21  9:52 AM   Specimen: Nasopharyngeal Swab; Nasopharyngeal(NP) swabs in vial transport medium  Result Value Ref Range Status   SARS Coronavirus 2 by RT PCR NEGATIVE NEGATIVE Final    Comment: (NOTE) SARS-CoV-2 target nucleic acids are NOT DETECTED.  The SARS-CoV-2 RNA is generally detectable in upper respiratory specimens during the acute phase of infection. The lowest concentration of SARS-CoV-2 viral copies this assay can detect is 138 copies/mL. A negative result does not preclude SARS-Cov-2 infection and should not be used as the sole basis for treatment or other patient management decisions. A negative result may occur with  improper specimen collection/handling, submission of specimen other than nasopharyngeal swab, presence of viral mutation(s) within the areas targeted by this assay, and inadequate number of viral copies(<138 copies/mL). A negative result must be combined with clinical observations, patient history, and  epidemiological information. The expected result is Negative.  Fact Sheet for Patients:  EntrepreneurPulse.com.au  Fact Sheet for Healthcare Providers:  IncredibleEmployment.be  This test is no t yet approved or cleared by the [place]  States FDA and  has been authorized for detection and/or diagnosis of SARS-CoV-2 by FDA under an Emergency Use Authorization (EUA). This EUA will remain  in effect (meaning this test can be used) for the duration of the COVID-19 declaration under Section 564(b)(1) of the Act, 21 U.S.C.section 360bbb-3(b)(1), unless the authorization is terminated  or revoked sooner.       Influenza A by PCR NEGATIVE NEGATIVE Final   Influenza B by PCR NEGATIVE NEGATIVE Final    Comment: (NOTE) The Xpert Xpress SARS-CoV-2/FLU/RSV plus assay is intended as an aid in the diagnosis of influenza from Nasopharyngeal swab specimens and should not be used as a sole basis for treatment. Nasal washings and aspirates are unacceptable for Xpert Xpress SARS-CoV-2/FLU/RSV testing.  Fact Sheet for Patients: EntrepreneurPulse.com.au  Fact Sheet for Healthcare Providers: IncredibleEmployment.be  This test is not yet approved or cleared by the [place] FDA and has been authorized for detection and/or diagnosis of SARS-CoV-2 by FDA under an Emergency Use Authorization (EUA). This EUA will remain in effect (meaning this test can be used) for the duration of the COVID-19 declaration under Section 564(b)(1) of the Act, 21 U.S.C. section 360bbb-3(b)(1), unless the authorization is terminated or revoked.  Performed at [facility], [place] [address], [place], [place] [address]      Labs: Basic Metabolic Panel: Recent Labs  Lab 08/05/21 0500 08/06/21 0836 08/07/21 0559 08/08/21 0506 08/09/21 0759  NA 133* 134* 134* 133* 137  K 3.9 3.8 3.7 3.8 3.7  CL 100 101 98 100 98  CO2 28 27  27 27 28   GLUCOSE 209* 163* 178* 252* 156*  BUN 28* 25* 22 21 20   CREATININE 1.79* 1.70* 1.53* 1.44* 1.40*  CALCIUM 8.2* 8.3* 8.1* 8.1* 8.3*   Liver Function Tests: Recent Labs  Lab 08/05/21 0500 08/06/21 0836 08/07/21 0559 08/08/21 0506 08/09/21 0759  AST 190* 133* 95* 77* 74*  ALT 78* 78* 71* 65* 69*  ALKPHOS 218* 209* 196* 185* 179*  BILITOT 1.1 1.2 1.2 1.0 1.0  PROT 5.2* 5.4* 5.3* 5.0* 5.5*  ALBUMIN 2.4* 2.5* 2.4* 2.4* 2.6*   CBC: Recent Labs  Lab 08/06/21 0836 08/07/21 0559 08/08/21 0506 08/08/21 1933 08/09/21 0759  WBC 6.3 6.0 5.4 6.1 4.9  HGB 8.5* 7.9* 7.7* 9.2* 9.3*  HCT 27.9* 26.0* 25.4* 29.9* 29.8*  MCV 83.5 82.5 81.9 82.8 81.6  PLT 116* 110* 97* 104* 95*   CBG: Recent Labs  Lab 08/08/21 0752 08/08/21 1205 08/08/21 1700 08/08/21 2206 08/09/21 0826  GLUCAP 201* 215* 183* 202* 149*   Hgb A1c No results for input(s): HGBA1C in the last 72 hours. Lipid Profile No results for input(s): CHOL, HDL, LDLCALC, TRIG, CHOLHDL, LDLDIRECT in the last 72 hours. Thyroid function studies No results for input(s): TSH, T4TOTAL, T3FREE, THYROIDAB in the last 72 hours.  Invalid input(s): FREET3 Urinalysis    Component Value Date/Time   COLORURINE AMBER (A) 07/17/2021 1848   APPEARANCEUR CLOUDY (A) 07/17/2021 1848   LABSPEC 1.016 07/17/2021 1848   PHURINE 5.0 07/17/2021 1848   GLUCOSEU >=500 (A) 07/17/2021 1848   HGBUR MODERATE (A) 07/17/2021 1848   BILIRUBINUR NEGATIVE 07/17/2021 1848   KETONESUR NEGATIVE 07/17/2021 1848   PROTEINUR 100 (A) 07/17/2021 1848   NITRITE NEGATIVE 07/17/2021 1848   LEUKOCYTESUR NEGATIVE 07/17/2021 1848    FURTHER DISCHARGE INSTRUCTIONS:   Get Medicines reviewed and adjusted: Please take all your medications with you for your next visit with your Primary MD   Laboratory/radiological  data: Please request your Primary MD to go over all hospital tests and procedure/radiological results at the follow up, please ask your Primary MD  to get all Hospital records sent to his/her office.   In some cases, they will be blood work, cultures and biopsy results pending at the time of your discharge. Please request that your primary care M.D. goes through all the records of your hospital data and follows up on these results.   Also Note the following: If you experience worsening of your admission symptoms, develop shortness of breath, life threatening emergency, suicidal or homicidal thoughts you must seek medical attention immediately by calling 911 or calling your MD immediately  if symptoms less severe.   You must read complete instructions/literature along with all the possible adverse reactions/side effects for all the Medicines you take and that have been prescribed to you. Take any new Medicines after you have completely understood and accpet all the possible adverse reactions/side effects.    Do not drive when taking Pain medications or sleeping medications (Benzodaizepines)   Do not take more than prescribed Pain, Sleep and Anxiety Medications. It is not advisable to combine anxiety,sleep and pain medications without talking with your primary care practitioner   Special Instructions: If you have smoked or chewed Tobacco  in the last 2 yrs please stop smoking, stop any regular Alcohol  and or any Recreational drug use.   Wear Seat belts while driving.   Please note: You were cared for by a hospitalist during your hospital stay. Once you are discharged, your primary care physician will handle any further medical issues. Please note that NO REFILLS for any discharge medications will be authorized once you are discharged, as it is imperative that you return to your primary care physician (or establish a relationship with a primary care physician if you do not have one) for your post hospital discharge needs so that they can reassess your need for medications and monitor your lab values.  Time coordinating discharge: 50  minutes  SIGNED:  Marzetta Board, MD, PhD 08/09/2021, 9:15 AM

## 2021-08-09 NOTE — Progress Notes (Signed)
Occupational Therapy Progress Note  Patient initially stating does not want to do therapy, with encouragement more agreeable and disposition improved. Does need directions repeated at times "so what are we doing?" After goals of session explained to patient. Initially patient needing heavy mod x2 to try and stand from bed, cues for body mechanics however unable to extend at knees/hips and patient stating "pull!" Returned to sitting, educate patient on using momentum to assist with powering up to standing and bed height elevated, able to standing with min +2 and rolling walker. After seated rest in recliner encourage further functional ambulation in room to work on activity tolerance and dynamic balance, patient needing min x2 for safety, with patient fatiguing quickly. Will continue to benefit from acute services to maximize endurance, global strengthening to improve safety/independence with ADLs.    08/09/21 1100  OT Visit Information  Last OT Received On 08/09/21  Assistance Needed +2 (to increase distance)  PT/OT/SLP Co-Evaluation/Treatment Yes  Reason for Co-Treatment To address functional/ADL transfers  PT goals addressed during session Mobility/safety with mobility  OT goals addressed during session ADL's and self-care  History of Present Illness 70 year old male with history of diabetes mellitus type 2, chronic systolic CHF, CKD stage III, paroxysmal atrial fibrillation, CAD, hypertension, osteoarthritis, patient presented with abdominal pain and diarrhea.  On presentation was found to have acute cholecystitis with choledocholithiasis and mild pancreatitis along with leukocytosis elevated LFTs., + C-Diff.  Precautions  Precautions Fall  Precaution Comments no longer having loose BM, not orthostatic 9/20  Pain Assessment  Pain Assessment Faces  Faces Pain Scale 0  Cognition  Arousal/Alertness Awake/alert  Behavior During Therapy Flat affect  Overall Cognitive Status No family/caregiver  present to determine baseline cognitive functioning  General Comments patient initially resistant to therapy, with encouragement does participate and spirits seem improved. asks "so what are we doing" 1-2x during session after explaining therapy goals for session  ADL  Overall ADL's  Needs assistance/impaired  Grooming Wash/dry face;Wash/dry hands;Set up;Sitting  Toilet Transfer Minimal assistance;Moderate assistance;+2 for physical assistance;+2 for safety/equipment;RW;Cueing for safety;Cueing for sequencing  Toilet Transfer Details (indicate cue type and reason) attempt sit to stand from edge of bed with +2 assist with patient stating "pull!" unable to extend at knees/hips and ultimately sat back onto bed. OT encourage patient to push through arms and utilize momentum to assist with standing with bed height also elevated on second attempt needing min +2.  Functional mobility during ADLs Minimal assistance;+2 for physical assistance;+2 for safety/equipment;Cueing for safety;Cueing for sequencing;Rolling walker;Moderate assistance  General ADL Comments encourage ambulation in room this session to work on functional activity tolerance and balance necessary for ADL tasks. patient min A x2 for safety  Bed Mobility  Overal bed mobility Modified Independent  General bed mobility comments no physical assistance to upright self from bed, HOB up and bed rail use  Balance  Overall balance assessment Needs assistance  Sitting-balance support Feet supported  Sitting balance-Leahy Scale Good  Standing balance support Bilateral upper extremity supported  Standing balance-Leahy Scale Poor  Standing balance comment reliant on B UE support of walker and external assist  Transfers  Overall transfer level Needs assistance  Equipment used Rolling walker (2 wheeled)  Transfers Sit to/from Stand  Sit to Stand Min assist;Mod assist;+2 physical assistance;+2 safety/equipment;From elevated surface  General transfer  comment please see toilet transfer in ADL section  OT - End of Session  Equipment Utilized During Treatment Gait belt;Rolling walker  Activity Tolerance Patient tolerated treatment  well  Patient left in chair;with call bell/phone within reach;with chair alarm set  Nurse Communication Mobility status  OT Assessment/Plan  OT Plan Discharge plan remains appropriate  OT Visit Diagnosis Unsteadiness on feet (R26.81);Other abnormalities of gait and mobility (R26.89)  OT Frequency (ACUTE ONLY) Min 2X/week  Follow Up Recommendations SNF  OT Equipment Tub/shower seat  AM-PAC OT "6 Clicks" Daily Activity Outcome Measure (Version 2)  Help from another person eating meals? 4  Help from another person taking care of personal grooming? 3  Help from another person toileting, which includes using toliet, bedpan, or urinal? 2  Help from another person bathing (including washing, rinsing, drying)? 2  Help from another person to put on and taking off regular upper body clothing? 3  Help from another person to put on and taking off regular lower body clothing? 2  6 Click Score 16  Progressive Mobility  What is the highest level of mobility based on the progressive mobility assessment? Level 4 (Walks with assist in room) - Balance while marching in place and cannot step forward and back - Complete  Mobility Ambulated with assistance in room;Out of bed to chair with meals  OT Goal Progression  Progress towards OT goals Progressing toward goals  Acute Rehab OT Goals  Patient Stated Goal I want to get stronger and get out of this bed.  OT Goal Formulation With patient  Time For Goal Achievement 08/12/21  Potential to Achieve Goals Good  ADL Goals  Pt Will Perform Lower Body Dressing with supervision;sit to/from stand;sitting/lateral leans  Pt Will Transfer to Toilet with min guard assist;ambulating;bedside commode (walker)  Pt Will Perform Toileting - Clothing Manipulation and hygiene with min guard  assist;sit to/from stand;sitting/lateral leans  Additional ADL Goal #1 Patient will follow multistep directions with less than 25% cues in order to participate in daily routine safely.  OT Time Calculation  OT Start Time (ACUTE ONLY) 0825  OT Stop Time (ACUTE ONLY) 0850  OT Time Calculation (min) 25 min  OT General Charges  $OT Visit 1 Visit  OT Treatments  $Self Care/Home Management  8-22 mins   Delbert Phenix OT OT pager: [phone]

## 2021-08-09 NOTE — Progress Notes (Signed)
Physical Therapy Treatment Patient Details Name: [name] [name] MRN: [id number] DOB: February 27, 1951 Today's Date: 08/09/2021   History of Present Illness 70 year old male with history of diabetes mellitus type 2, chronic systolic CHF, CKD stage III, paroxysmal atrial fibrillation, CAD, hypertension, osteoarthritis, patient presented with abdominal pain and diarrhea.  On presentation was found to have acute cholecystitis with choledocholithiasis and mild pancreatitis along with leukocytosis elevated LFTs., + C-Diff.    PT Comments    Patient required some encouragement to mobilize. Once going, patient demonstrates much improvement in strength. Ambulated in room x 25' with RW.  Continue progressive ambulation. No  lose BM incident today.  Recommendations for follow up therapy are one component of a multi-disciplinary discharge planning process, led by the attending physician.  Recommendations may be updated based on patient status, additional functional criteria and insurance authorization.  Follow Up Recommendations  SNF     Equipment Recommendations  None recommended by PT    Recommendations for Other Services       Precautions / Restrictions Precautions Precautions: Fall Precaution Comments: no longer having loose BM Restrictions Weight Bearing Restrictions: No     Mobility  Bed Mobility   Bed Mobility: Rolling;Sidelying to Sit Rolling: Supervision Sidelying to sit: Supervision;HOB elevated Supine to sit: HOB elevated;Mod assist     General bed mobility comments: use of rail to assist to sitting upright, no asssit from therapist    Transfers Overall transfer level: Needs assistance Equipment used: Rolling walker (2 wheeled) Transfers: Sit to/from Stand;Stand Pivot Transfers Sit to Stand: +2 safety/equipment;From elevated surface;Mod assist;Min assist Stand pivot transfers: +2 physical assistance;Min assist       General transfer comment: mod assist and 2 trie from  bed. Min assist from recliner, pushing up on armrest, took small steps to recliner using RW.  Ambulation/Gait Ambulation/Gait assistance: Min assist;+2 safety/equipment Gait Distance (Feet): 25 Feet Assistive device: Rolling walker (2 wheeled) Gait Pattern/deviations: Step-to pattern;Step-through pattern     General Gait Details: Able to ambu;ate with mion assistnace, knees appear much more steady, not buckling.   Stairs             Wheelchair Mobility    Modified Rankin (Stroke Patients Only)       Balance Overall balance assessment: Needs assistance Sitting-balance support: Feet supported Sitting balance-Leahy Scale: Good Sitting balance - Comments: patient was able to maintain sitting balance on edge of bed with no support   Standing balance support: Bilateral upper extremity supported;During functional activity Standing balance-Leahy Scale: Fair Standing balance comment: reliant on UE support                            Cognition Arousal/Alertness: Awake/alert Behavior During Therapy: Flat affect Overall Cognitive Status: Within Functional Limits for tasks assessed                                 General Comments: needs lots of encouragement to mobilize      Exercises      General Comments        Pertinent Vitals/Pain Pain Assessment: No/denies pain    Home Living                      Prior Function            PT Goals (current goals can now be found in the care plan section)  Acute Rehab PT Goals Patient Stated Goal: I want to get stronger and get out of this bed. PT Goal Formulation: With patient Time For Goal Achievement: 08/23/21 Potential to Achieve Goals: Good    Frequency    Min 2X/week      PT Plan Current plan remains appropriate;Frequency needs to be updated    Co-evaluation PT/OT/SLP Co-Evaluation/Treatment: Yes Reason for Co-Treatment: For patient/therapist safety PT goals addressed  during session: Mobility/safety with mobility OT goals addressed during session: ADL's and self-care      AM-PAC PT "6 Clicks" Mobility   Outcome Measure  Help needed turning from your back to your side while in a flat bed without using bedrails?: A Little Help needed moving from lying on your back to sitting on the side of a flat bed without using bedrails?: A Little Help needed moving to and from a bed to a chair (including a wheelchair)?: A Little Help needed standing up from a chair using your arms (e.g., wheelchair or bedside chair)?: A Little Help needed to walk in hospital room?: A Little Help needed climbing 3-5 steps with a railing? : A Lot 6 Click Score: 17    End of Session Equipment Utilized During Treatment: Gait belt Activity Tolerance: Patient tolerated treatment well Patient left: in chair;with call bell/phone within reach;with chair alarm set Nurse Communication: Mobility status PT Visit Diagnosis: Other abnormalities of gait and mobility (R26.89);Muscle weakness (generalized) (M62.81)     Time: 5284-1324 PT Time Calculation (min) (ACUTE ONLY): 27 min  Charges:  $Gait Training: 8-22 mins                     [place] Pager [phone] Office [phone]    Claretha Cooper 08/09/2021, 9:56 AM

## 2021-08-09 NOTE — Progress Notes (Addendum)
Picked up by [facility]. Some night meds were given. Patient belongings were sent with patient including discharge papers.

## 2021-08-09 NOTE — TOC Transition Note (Signed)
Transition of Care [facility]) - CM/SW Discharge Note   Patient Details  Name: [name] [name] MRN: [id number] Date of Birth: 10-Jan-1951  Transition of Care [facility]) CM/SW Contact:  Lynnell Catalan, RN Phone Number: 08/09/2021, 12:58 PM   Clinical Narrative:     Pt to dc to [facility] today. He will go to room 140 and RN to call report to [phone]. [facility] to transport pt to facility. Yellow DNR on chart for transport. Son Will called to inform of DC.  Final next level of care: Skilled Nursing Facility Barriers to Discharge: Continued Medical Work up   Patient Goals and CMS Choice Patient states their goals for this hospitalization and ongoing recovery are:: Rehab CMS Medicare.gov Compare Post Acute Care list provided to:: Patient Choice offered to / list presented to : Patient  Discharge Placement              Patient chooses bed at: Other - please specify in the comment section below: ([facility]) Patient to be transferred to facility by: [place] Name of family member notified: son Will Patient and family notified of of transfer: 08/09/21  Discharge Plan and Services                                     Social Determinants of Health (SDOH) Interventions     Readmission Risk Interventions Readmission Risk Prevention Plan 07/27/2021  Transportation Screening Complete  PCP or Specialist Appt within 3-5 Days Complete  [place]RI or [place] Complete  Social Work Consult for [place] Planning/Counseling Complete  Palliative Care Screening Complete  Medication Review [employer]) Complete  Some recent data might be hidden

## 2021-08-11 DIAGNOSIS — K922 Gastrointestinal hemorrhage, unspecified: Secondary | ICD-10-CM | POA: Diagnosis not present

## 2021-08-11 DIAGNOSIS — K810 Acute cholecystitis: Secondary | ICD-10-CM | POA: Diagnosis not present

## 2021-08-11 DIAGNOSIS — D696 Thrombocytopenia, unspecified: Secondary | ICD-10-CM | POA: Diagnosis not present

## 2021-08-11 DIAGNOSIS — E119 Type 2 diabetes mellitus without complications: Secondary | ICD-10-CM | POA: Diagnosis not present

## 2021-08-11 DIAGNOSIS — K8050 Calculus of bile duct without cholangitis or cholecystitis without obstruction: Secondary | ICD-10-CM | POA: Diagnosis not present

## 2021-08-11 DIAGNOSIS — I10 Essential (primary) hypertension: Secondary | ICD-10-CM | POA: Diagnosis not present

## 2021-08-11 DIAGNOSIS — K851 Biliary acute pancreatitis without necrosis or infection: Secondary | ICD-10-CM | POA: Diagnosis not present

## 2021-08-11 DIAGNOSIS — I509 Heart failure, unspecified: Secondary | ICD-10-CM | POA: Diagnosis not present

## 2021-08-12 DIAGNOSIS — E0800 Diabetes mellitus due to underlying condition with hyperosmolarity without nonketotic hyperglycemic-hyperosmolar coma (NKHHC): Secondary | ICD-10-CM | POA: Diagnosis not present

## 2021-08-15 DIAGNOSIS — R799 Abnormal finding of blood chemistry, unspecified: Secondary | ICD-10-CM | POA: Diagnosis not present

## 2021-08-16 DIAGNOSIS — E119 Type 2 diabetes mellitus without complications: Secondary | ICD-10-CM | POA: Diagnosis not present

## 2021-08-16 DIAGNOSIS — I5022 Chronic systolic (congestive) heart failure: Secondary | ICD-10-CM | POA: Diagnosis not present

## 2021-08-16 DIAGNOSIS — R5381 Other malaise: Secondary | ICD-10-CM | POA: Diagnosis not present

## 2021-08-16 DIAGNOSIS — I480 Paroxysmal atrial fibrillation: Secondary | ICD-10-CM | POA: Diagnosis not present

## 2021-08-16 DIAGNOSIS — E782 Mixed hyperlipidemia: Secondary | ICD-10-CM | POA: Diagnosis not present

## 2021-08-16 DIAGNOSIS — M109 Gout, unspecified: Secondary | ICD-10-CM | POA: Diagnosis not present

## 2021-08-16 DIAGNOSIS — R7309 Other abnormal glucose: Secondary | ICD-10-CM | POA: Diagnosis not present

## 2021-08-17 ENCOUNTER — Ambulatory Visit: Payer: Medicare Other | Admitting: Physician Assistant

## 2021-08-19 DIAGNOSIS — I10 Essential (primary) hypertension: Secondary | ICD-10-CM | POA: Diagnosis not present

## 2021-08-23 DIAGNOSIS — M159 Polyosteoarthritis, unspecified: Secondary | ICD-10-CM | POA: Diagnosis not present

## 2021-08-23 DIAGNOSIS — I252 Old myocardial infarction: Secondary | ICD-10-CM | POA: Diagnosis not present

## 2021-08-23 DIAGNOSIS — I2510 Atherosclerotic heart disease of native coronary artery without angina pectoris: Secondary | ICD-10-CM | POA: Diagnosis not present

## 2021-08-23 DIAGNOSIS — E1122 Type 2 diabetes mellitus with diabetic chronic kidney disease: Secondary | ICD-10-CM | POA: Diagnosis not present

## 2021-08-23 DIAGNOSIS — I480 Paroxysmal atrial fibrillation: Secondary | ICD-10-CM | POA: Diagnosis not present

## 2021-08-23 DIAGNOSIS — Z96652 Presence of left artificial knee joint: Secondary | ICD-10-CM | POA: Diagnosis not present

## 2021-08-23 DIAGNOSIS — I130 Hypertensive heart and chronic kidney disease with heart failure and stage 1 through stage 4 chronic kidney disease, or unspecified chronic kidney disease: Secondary | ICD-10-CM | POA: Diagnosis not present

## 2021-08-23 DIAGNOSIS — I5022 Chronic systolic (congestive) heart failure: Secondary | ICD-10-CM | POA: Diagnosis not present

## 2021-08-23 DIAGNOSIS — Z7984 Long term (current) use of oral hypoglycemic drugs: Secondary | ICD-10-CM | POA: Diagnosis not present

## 2021-08-23 DIAGNOSIS — Z7901 Long term (current) use of anticoagulants: Secondary | ICD-10-CM | POA: Diagnosis not present

## 2021-08-23 DIAGNOSIS — I255 Ischemic cardiomyopathy: Secondary | ICD-10-CM | POA: Diagnosis not present

## 2021-08-23 DIAGNOSIS — Z794 Long term (current) use of insulin: Secondary | ICD-10-CM | POA: Diagnosis not present

## 2021-08-23 DIAGNOSIS — E782 Mixed hyperlipidemia: Secondary | ICD-10-CM | POA: Diagnosis not present

## 2021-08-23 DIAGNOSIS — M1030 Gout due to renal impairment, unspecified site: Secondary | ICD-10-CM | POA: Diagnosis not present

## 2021-08-23 DIAGNOSIS — F32A Depression, unspecified: Secondary | ICD-10-CM | POA: Diagnosis not present

## 2021-08-23 DIAGNOSIS — N183 Chronic kidney disease, stage 3 unspecified: Secondary | ICD-10-CM | POA: Diagnosis not present

## 2021-08-24 DIAGNOSIS — I252 Old myocardial infarction: Secondary | ICD-10-CM | POA: Diagnosis not present

## 2021-08-24 DIAGNOSIS — Z794 Long term (current) use of insulin: Secondary | ICD-10-CM | POA: Diagnosis not present

## 2021-08-24 DIAGNOSIS — I5022 Chronic systolic (congestive) heart failure: Secondary | ICD-10-CM | POA: Diagnosis not present

## 2021-08-24 DIAGNOSIS — N183 Chronic kidney disease, stage 3 unspecified: Secondary | ICD-10-CM | POA: Diagnosis not present

## 2021-08-24 DIAGNOSIS — Z96652 Presence of left artificial knee joint: Secondary | ICD-10-CM | POA: Diagnosis not present

## 2021-08-24 DIAGNOSIS — E782 Mixed hyperlipidemia: Secondary | ICD-10-CM | POA: Diagnosis not present

## 2021-08-24 DIAGNOSIS — M159 Polyosteoarthritis, unspecified: Secondary | ICD-10-CM | POA: Diagnosis not present

## 2021-08-24 DIAGNOSIS — I130 Hypertensive heart and chronic kidney disease with heart failure and stage 1 through stage 4 chronic kidney disease, or unspecified chronic kidney disease: Secondary | ICD-10-CM | POA: Diagnosis not present

## 2021-08-24 DIAGNOSIS — M1030 Gout due to renal impairment, unspecified site: Secondary | ICD-10-CM | POA: Diagnosis not present

## 2021-08-24 DIAGNOSIS — E1122 Type 2 diabetes mellitus with diabetic chronic kidney disease: Secondary | ICD-10-CM | POA: Diagnosis not present

## 2021-08-24 DIAGNOSIS — F32A Depression, unspecified: Secondary | ICD-10-CM | POA: Diagnosis not present

## 2021-08-24 DIAGNOSIS — I255 Ischemic cardiomyopathy: Secondary | ICD-10-CM | POA: Diagnosis not present

## 2021-08-24 DIAGNOSIS — Z7901 Long term (current) use of anticoagulants: Secondary | ICD-10-CM | POA: Diagnosis not present

## 2021-08-24 DIAGNOSIS — Z7984 Long term (current) use of oral hypoglycemic drugs: Secondary | ICD-10-CM | POA: Diagnosis not present

## 2021-08-24 DIAGNOSIS — I480 Paroxysmal atrial fibrillation: Secondary | ICD-10-CM | POA: Diagnosis not present

## 2021-08-24 DIAGNOSIS — I2510 Atherosclerotic heart disease of native coronary artery without angina pectoris: Secondary | ICD-10-CM | POA: Diagnosis not present

## 2021-08-30 DIAGNOSIS — K769 Liver disease, unspecified: Secondary | ICD-10-CM | POA: Diagnosis not present

## 2021-08-30 DIAGNOSIS — I482 Chronic atrial fibrillation, unspecified: Secondary | ICD-10-CM | POA: Diagnosis not present

## 2021-08-30 DIAGNOSIS — K750 Abscess of liver: Secondary | ICD-10-CM | POA: Diagnosis not present

## 2021-08-30 DIAGNOSIS — Z96653 Presence of artificial knee joint, bilateral: Secondary | ICD-10-CM | POA: Diagnosis not present

## 2021-08-30 DIAGNOSIS — J984 Other disorders of lung: Secondary | ICD-10-CM | POA: Diagnosis not present

## 2021-08-30 DIAGNOSIS — M1990 Unspecified osteoarthritis, unspecified site: Secondary | ICD-10-CM | POA: Diagnosis not present

## 2021-08-30 DIAGNOSIS — R7881 Bacteremia: Secondary | ICD-10-CM | POA: Diagnosis not present

## 2021-08-30 DIAGNOSIS — N183 Chronic kidney disease, stage 3 unspecified: Secondary | ICD-10-CM | POA: Diagnosis not present

## 2021-08-30 DIAGNOSIS — K838 Other specified diseases of biliary tract: Secondary | ICD-10-CM | POA: Diagnosis not present

## 2021-08-30 DIAGNOSIS — A419 Sepsis, unspecified organism: Secondary | ICD-10-CM | POA: Diagnosis not present

## 2021-08-30 DIAGNOSIS — N281 Cyst of kidney, acquired: Secondary | ICD-10-CM | POA: Diagnosis not present

## 2021-08-30 DIAGNOSIS — N289 Disorder of kidney and ureter, unspecified: Secondary | ICD-10-CM | POA: Diagnosis not present

## 2021-08-30 DIAGNOSIS — I517 Cardiomegaly: Secondary | ICD-10-CM | POA: Diagnosis not present

## 2021-08-30 DIAGNOSIS — G4733 Obstructive sleep apnea (adult) (pediatric): Secondary | ICD-10-CM | POA: Diagnosis not present

## 2021-08-30 DIAGNOSIS — I130 Hypertensive heart and chronic kidney disease with heart failure and stage 1 through stage 4 chronic kidney disease, or unspecified chronic kidney disease: Secondary | ICD-10-CM | POA: Diagnosis not present

## 2021-08-30 DIAGNOSIS — Z515 Encounter for palliative care: Secondary | ICD-10-CM | POA: Diagnosis not present

## 2021-08-30 DIAGNOSIS — R188 Other ascites: Secondary | ICD-10-CM | POA: Diagnosis not present

## 2021-08-30 DIAGNOSIS — I2510 Atherosclerotic heart disease of native coronary artery without angina pectoris: Secondary | ICD-10-CM | POA: Diagnosis not present

## 2021-08-30 DIAGNOSIS — R9431 Abnormal electrocardiogram [ECG] [EKG]: Secondary | ICD-10-CM | POA: Diagnosis not present

## 2021-08-30 DIAGNOSIS — A4150 Gram-negative sepsis, unspecified: Secondary | ICD-10-CM | POA: Diagnosis not present

## 2021-08-30 DIAGNOSIS — C787 Secondary malignant neoplasm of liver and intrahepatic bile duct: Secondary | ICD-10-CM | POA: Diagnosis not present

## 2021-08-30 DIAGNOSIS — J90 Pleural effusion, not elsewhere classified: Secondary | ICD-10-CM | POA: Diagnosis not present

## 2021-08-30 DIAGNOSIS — R6521 Severe sepsis with septic shock: Secondary | ICD-10-CM | POA: Diagnosis not present

## 2021-08-30 DIAGNOSIS — R52 Pain, unspecified: Secondary | ICD-10-CM | POA: Diagnosis not present

## 2021-08-30 DIAGNOSIS — E872 Acidosis, unspecified: Secondary | ICD-10-CM | POA: Diagnosis not present

## 2021-08-30 DIAGNOSIS — D688 Other specified coagulation defects: Secondary | ICD-10-CM | POA: Diagnosis not present

## 2021-08-30 DIAGNOSIS — A4189 Other specified sepsis: Secondary | ICD-10-CM | POA: Diagnosis not present

## 2021-08-30 DIAGNOSIS — R0602 Shortness of breath: Secondary | ICD-10-CM | POA: Diagnosis not present

## 2021-08-30 DIAGNOSIS — R5381 Other malaise: Secondary | ICD-10-CM | POA: Diagnosis not present

## 2021-08-30 DIAGNOSIS — I081 Rheumatic disorders of both mitral and tricuspid valves: Secondary | ICD-10-CM | POA: Diagnosis not present

## 2021-08-30 DIAGNOSIS — K8042 Calculus of bile duct with acute cholecystitis without obstruction: Secondary | ICD-10-CM | POA: Diagnosis not present

## 2021-08-30 DIAGNOSIS — I480 Paroxysmal atrial fibrillation: Secondary | ICD-10-CM | POA: Diagnosis not present

## 2021-08-30 DIAGNOSIS — I5021 Acute systolic (congestive) heart failure: Secondary | ICD-10-CM | POA: Diagnosis not present

## 2021-08-30 DIAGNOSIS — Z95811 Presence of heart assist device: Secondary | ICD-10-CM | POA: Diagnosis not present

## 2021-08-30 DIAGNOSIS — R799 Abnormal finding of blood chemistry, unspecified: Secondary | ICD-10-CM | POA: Diagnosis not present

## 2021-08-30 DIAGNOSIS — D649 Anemia, unspecified: Secondary | ICD-10-CM | POA: Diagnosis not present

## 2021-08-30 DIAGNOSIS — R140 Abdominal distension (gaseous): Secondary | ICD-10-CM | POA: Diagnosis not present

## 2021-08-30 DIAGNOSIS — K810 Acute cholecystitis: Secondary | ICD-10-CM | POA: Diagnosis not present

## 2021-08-30 DIAGNOSIS — I499 Cardiac arrhythmia, unspecified: Secondary | ICD-10-CM | POA: Diagnosis not present

## 2021-08-30 DIAGNOSIS — E1122 Type 2 diabetes mellitus with diabetic chronic kidney disease: Secondary | ICD-10-CM | POA: Diagnosis not present

## 2021-08-30 DIAGNOSIS — R109 Unspecified abdominal pain: Secondary | ICD-10-CM | POA: Diagnosis not present

## 2021-08-30 DIAGNOSIS — E43 Unspecified severe protein-calorie malnutrition: Secondary | ICD-10-CM | POA: Diagnosis not present

## 2021-08-30 DIAGNOSIS — E785 Hyperlipidemia, unspecified: Secondary | ICD-10-CM | POA: Diagnosis not present

## 2021-08-30 DIAGNOSIS — R778 Other specified abnormalities of plasma proteins: Secondary | ICD-10-CM | POA: Diagnosis not present

## 2021-08-30 DIAGNOSIS — N200 Calculus of kidney: Secondary | ICD-10-CM | POA: Diagnosis not present

## 2021-08-30 DIAGNOSIS — Z66 Do not resuscitate: Secondary | ICD-10-CM | POA: Diagnosis not present

## 2021-08-30 DIAGNOSIS — K828 Other specified diseases of gallbladder: Secondary | ICD-10-CM | POA: Diagnosis not present

## 2021-08-30 DIAGNOSIS — I5023 Acute on chronic systolic (congestive) heart failure: Secondary | ICD-10-CM | POA: Diagnosis not present

## 2021-08-30 DIAGNOSIS — K819 Cholecystitis, unspecified: Secondary | ICD-10-CM | POA: Diagnosis not present

## 2021-08-30 DIAGNOSIS — Z743 Need for continuous supervision: Secondary | ICD-10-CM | POA: Diagnosis not present

## 2021-08-30 DIAGNOSIS — N179 Acute kidney failure, unspecified: Secondary | ICD-10-CM | POA: Diagnosis not present

## 2021-08-30 DIAGNOSIS — Z7189 Other specified counseling: Secondary | ICD-10-CM | POA: Diagnosis not present

## 2021-08-30 DIAGNOSIS — D684 Acquired coagulation factor deficiency: Secondary | ICD-10-CM | POA: Diagnosis not present

## 2021-08-30 DIAGNOSIS — R0902 Hypoxemia: Secondary | ICD-10-CM | POA: Diagnosis not present

## 2021-08-30 DIAGNOSIS — I5022 Chronic systolic (congestive) heart failure: Secondary | ICD-10-CM | POA: Diagnosis not present

## 2021-08-30 DIAGNOSIS — D689 Coagulation defect, unspecified: Secondary | ICD-10-CM | POA: Diagnosis not present

## 2021-08-31 DIAGNOSIS — I5023 Acute on chronic systolic (congestive) heart failure: Secondary | ICD-10-CM | POA: Diagnosis not present

## 2021-08-31 DIAGNOSIS — I482 Chronic atrial fibrillation, unspecified: Secondary | ICD-10-CM | POA: Diagnosis not present

## 2021-08-31 DIAGNOSIS — N179 Acute kidney failure, unspecified: Secondary | ICD-10-CM | POA: Diagnosis not present

## 2021-08-31 DIAGNOSIS — A419 Sepsis, unspecified organism: Secondary | ICD-10-CM | POA: Diagnosis not present

## 2021-08-31 DIAGNOSIS — R7881 Bacteremia: Secondary | ICD-10-CM | POA: Diagnosis not present

## 2021-08-31 DIAGNOSIS — A4189 Other specified sepsis: Secondary | ICD-10-CM | POA: Diagnosis not present

## 2021-08-31 DIAGNOSIS — R9431 Abnormal electrocardiogram [ECG] [EKG]: Secondary | ICD-10-CM | POA: Diagnosis not present

## 2021-08-31 DIAGNOSIS — K750 Abscess of liver: Secondary | ICD-10-CM | POA: Diagnosis not present

## 2021-08-31 DIAGNOSIS — K828 Other specified diseases of gallbladder: Secondary | ICD-10-CM | POA: Diagnosis not present

## 2021-08-31 DIAGNOSIS — D689 Coagulation defect, unspecified: Secondary | ICD-10-CM | POA: Diagnosis not present

## 2021-08-31 DIAGNOSIS — K810 Acute cholecystitis: Secondary | ICD-10-CM | POA: Diagnosis not present

## 2021-08-31 DIAGNOSIS — R778 Other specified abnormalities of plasma proteins: Secondary | ICD-10-CM | POA: Diagnosis not present

## 2021-08-31 DIAGNOSIS — K8042 Calculus of bile duct with acute cholecystitis without obstruction: Secondary | ICD-10-CM | POA: Diagnosis not present

## 2021-08-31 DIAGNOSIS — R6521 Severe sepsis with septic shock: Secondary | ICD-10-CM | POA: Diagnosis not present

## 2021-09-01 DIAGNOSIS — K8042 Calculus of bile duct with acute cholecystitis without obstruction: Secondary | ICD-10-CM | POA: Diagnosis not present

## 2021-09-01 DIAGNOSIS — A419 Sepsis, unspecified organism: Secondary | ICD-10-CM | POA: Diagnosis not present

## 2021-09-01 DIAGNOSIS — I081 Rheumatic disorders of both mitral and tricuspid valves: Secondary | ICD-10-CM | POA: Diagnosis not present

## 2021-09-01 DIAGNOSIS — R52 Pain, unspecified: Secondary | ICD-10-CM | POA: Diagnosis not present

## 2021-09-01 DIAGNOSIS — D689 Coagulation defect, unspecified: Secondary | ICD-10-CM | POA: Diagnosis not present

## 2021-09-01 DIAGNOSIS — Z7189 Other specified counseling: Secondary | ICD-10-CM | POA: Diagnosis not present

## 2021-09-01 DIAGNOSIS — Z515 Encounter for palliative care: Secondary | ICD-10-CM | POA: Diagnosis not present

## 2021-09-01 DIAGNOSIS — I482 Chronic atrial fibrillation, unspecified: Secondary | ICD-10-CM | POA: Diagnosis not present

## 2021-09-01 DIAGNOSIS — R7881 Bacteremia: Secondary | ICD-10-CM | POA: Diagnosis not present

## 2021-09-01 DIAGNOSIS — K750 Abscess of liver: Secondary | ICD-10-CM | POA: Diagnosis not present

## 2021-09-01 DIAGNOSIS — J90 Pleural effusion, not elsewhere classified: Secondary | ICD-10-CM | POA: Diagnosis not present

## 2021-09-01 DIAGNOSIS — R109 Unspecified abdominal pain: Secondary | ICD-10-CM | POA: Diagnosis not present

## 2021-09-02 DIAGNOSIS — R109 Unspecified abdominal pain: Secondary | ICD-10-CM | POA: Diagnosis not present

## 2021-09-02 DIAGNOSIS — Z515 Encounter for palliative care: Secondary | ICD-10-CM | POA: Diagnosis not present

## 2021-09-02 DIAGNOSIS — K8042 Calculus of bile duct with acute cholecystitis without obstruction: Secondary | ICD-10-CM | POA: Diagnosis not present

## 2021-09-02 DIAGNOSIS — R52 Pain, unspecified: Secondary | ICD-10-CM | POA: Diagnosis not present

## 2021-09-07 ENCOUNTER — Encounter (HOSPITAL_COMMUNITY): Payer: Medicare Other

## 2021-09-26 IMAGING — DX DG KNEE 1-2V PORT*L*
2 series · 2 of 2 positions shown · non-contrast
Comparison: 08/17/2020

CLINICAL DATA: Left knee arthroplasty

EXAM:
PORTABLE LEFT KNEE - 1-2 VIEW

[knee ap]
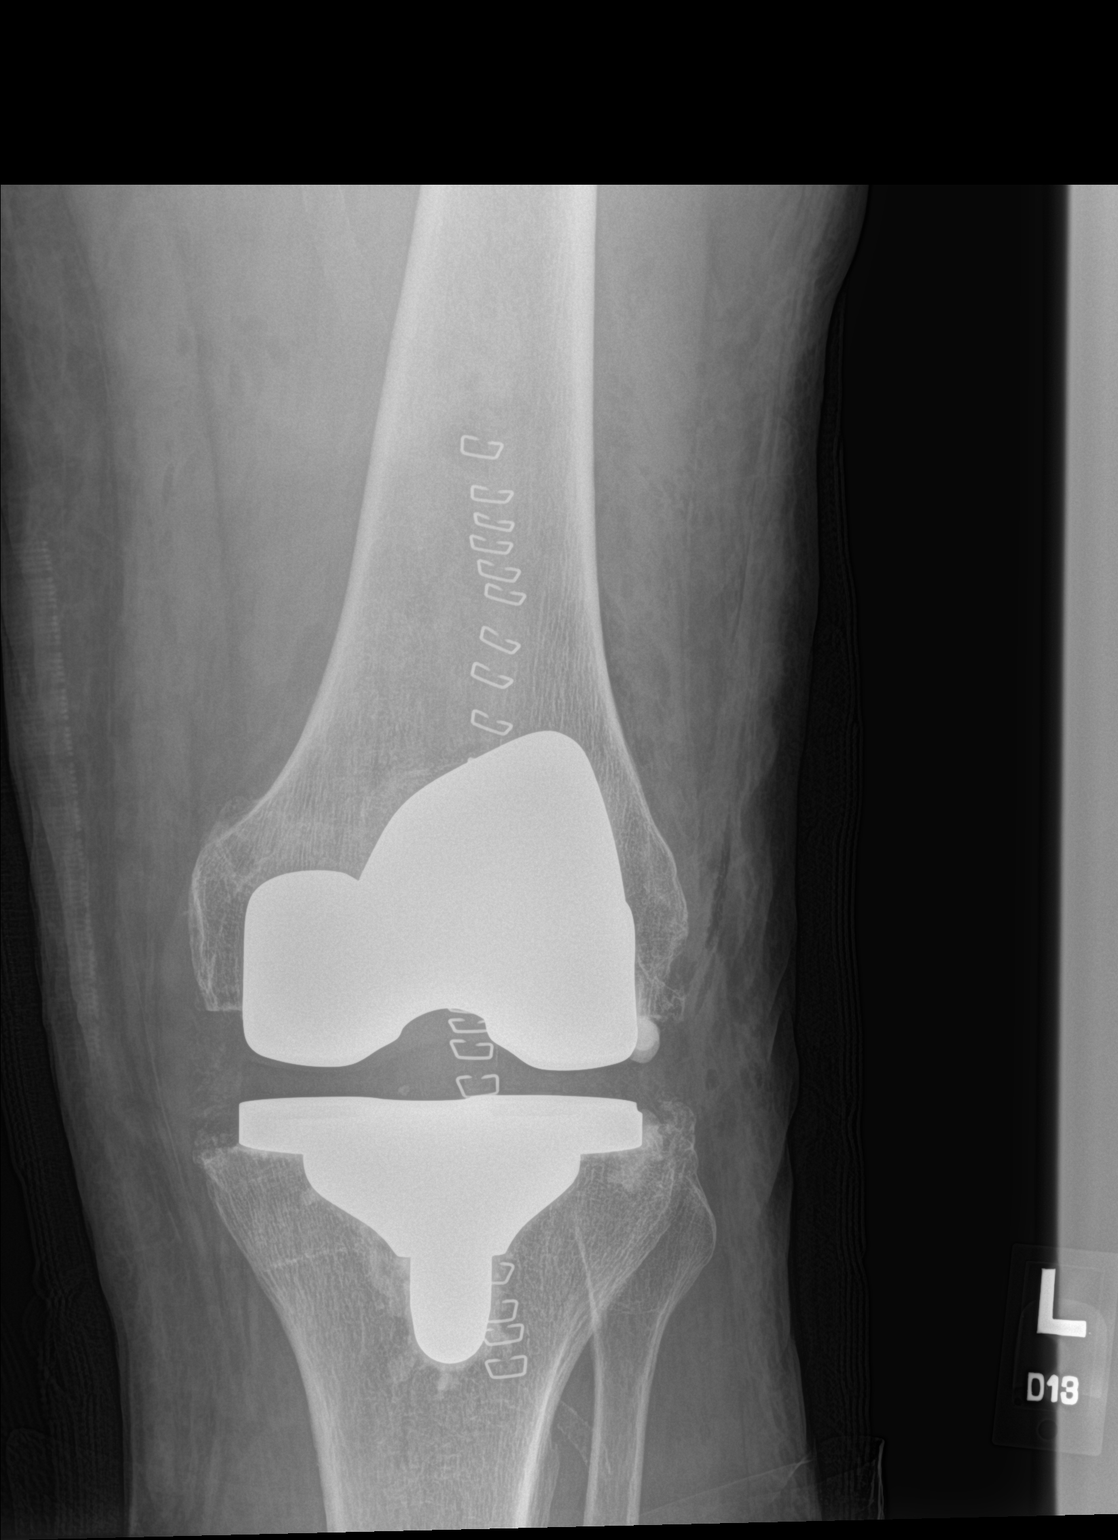

[knee lat]
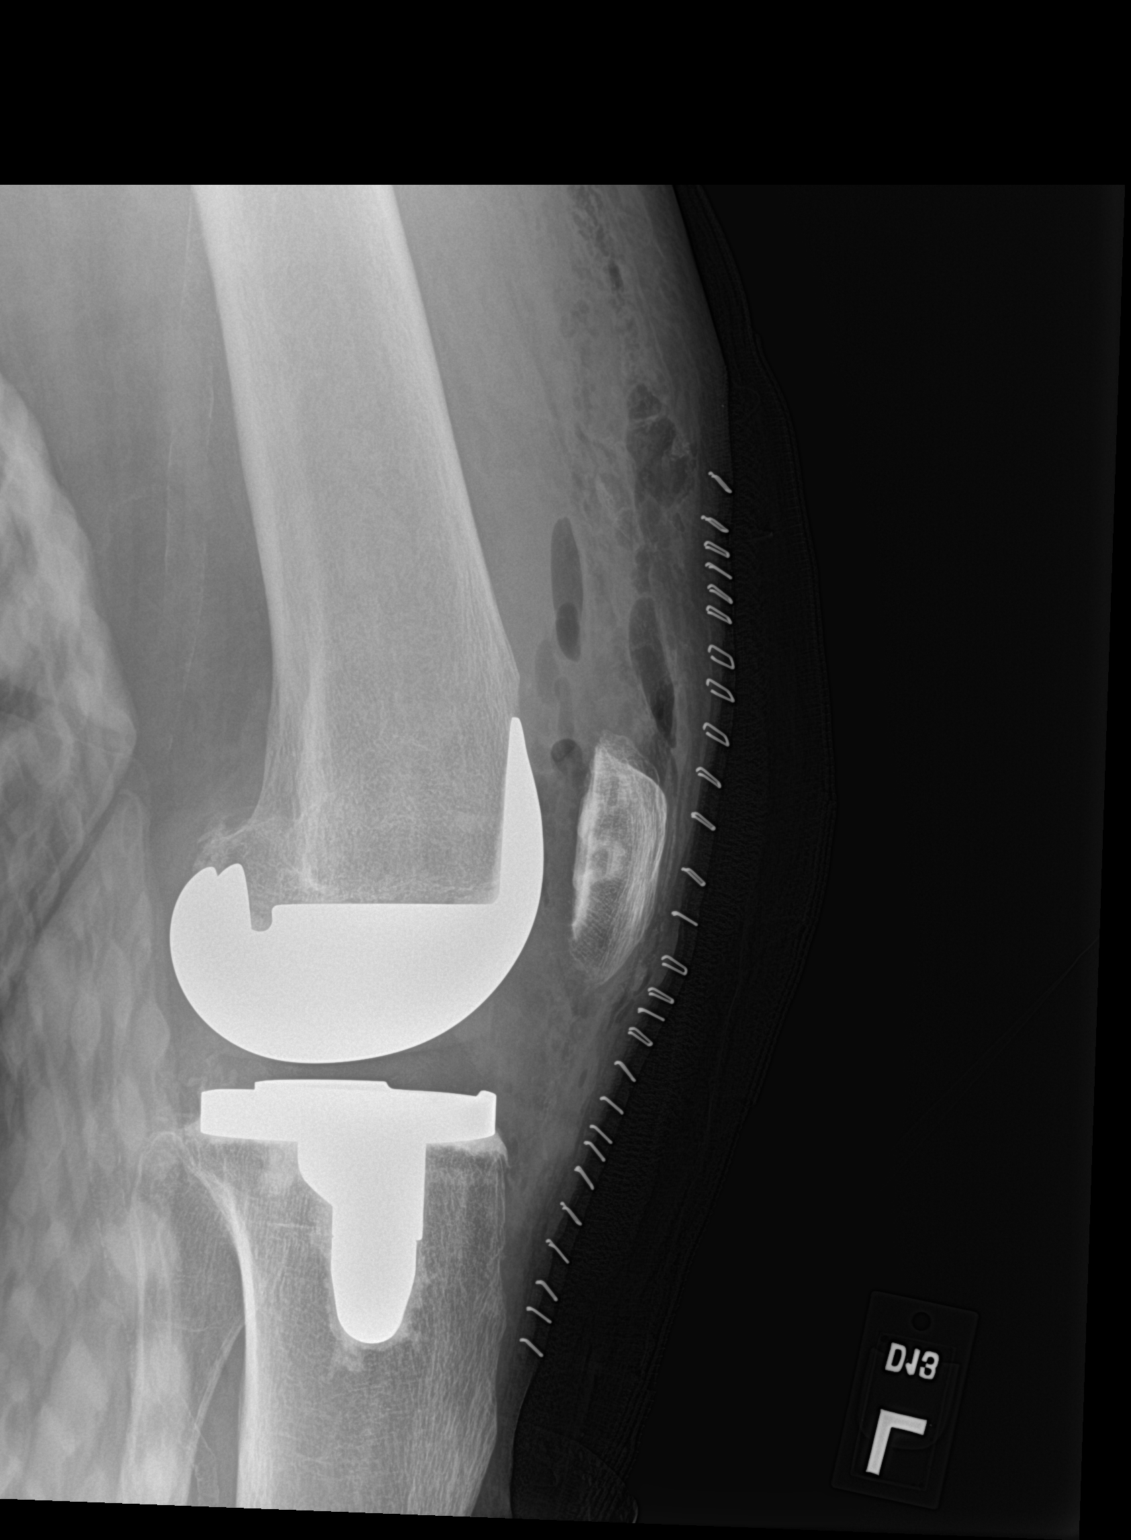

[2 of 2 positions shown; findings below may reference images not displayed]

FINDINGS: Interval postsurgical changes from left total knee arthroplasty.
Arthroplasty components are in their expected alignment without
evidence of complication. Expected postoperative changes within the
overlying soft tissues.
IMPRESSION: Satisfactory postoperative appearance status post left total knee
arthroplasty.

## 2022-02-24 IMAGING — CR DG CHEST 2V
2 series · 2 of 2 positions shown · non-contrast
Comparison: August 17, 2016

CLINICAL DATA: Shortness of breath

EXAM:
CHEST - 2 VIEW

[w chest lat]
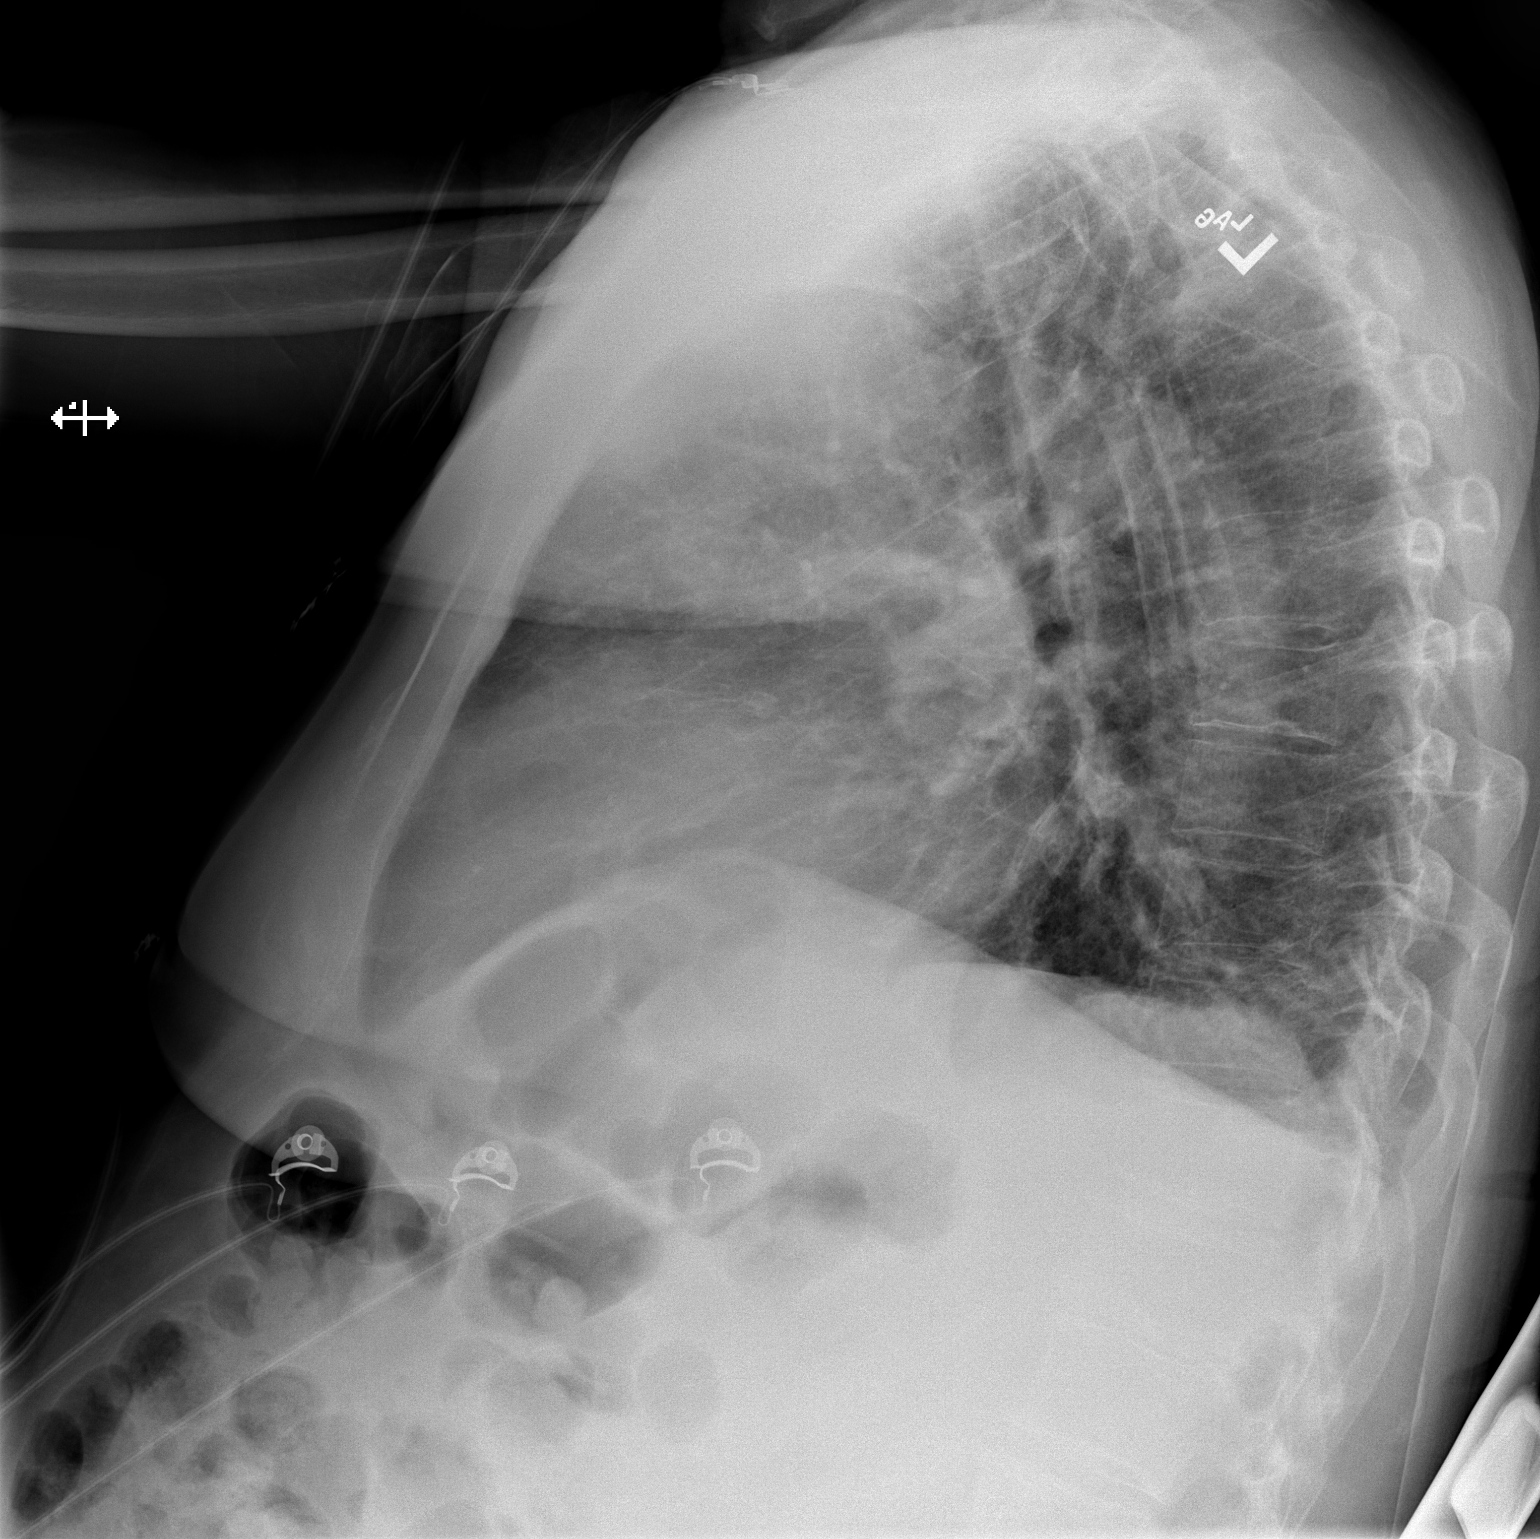

[x chest ap]
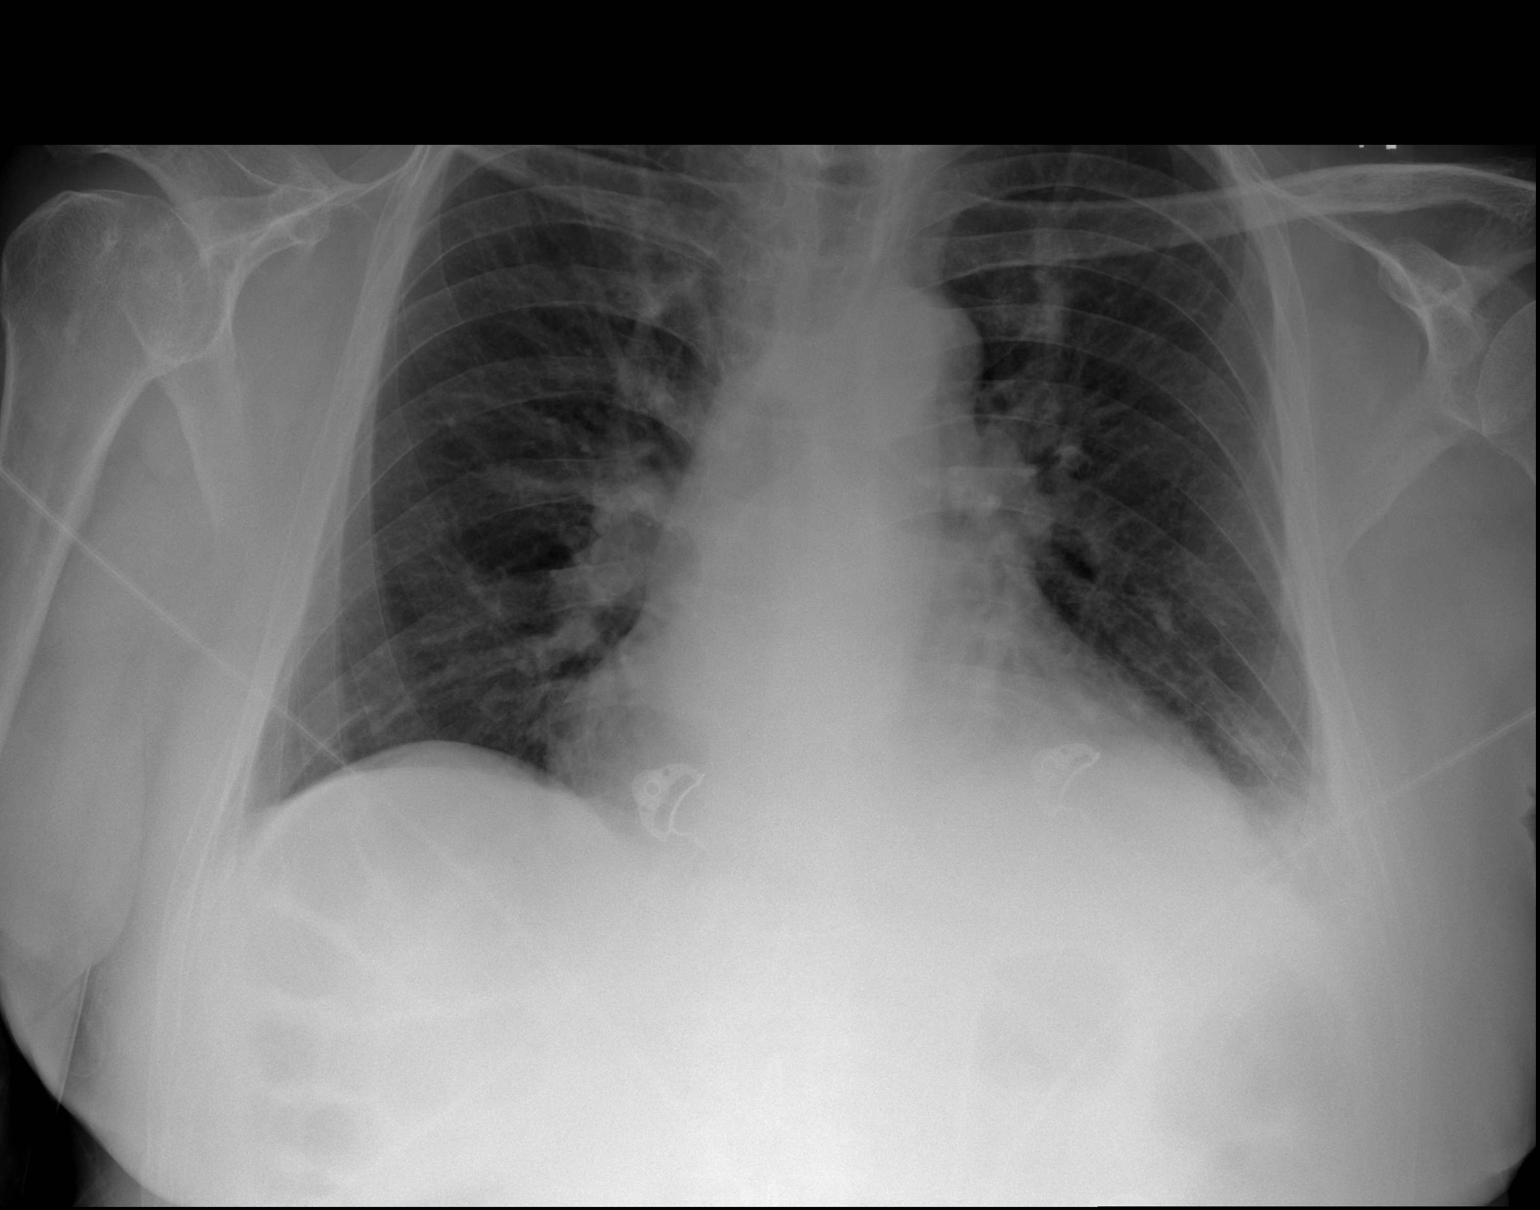

[2 of 2 positions shown; findings below may reference images not displayed]

FINDINGS: There is mild left base atelectasis. The lungs elsewhere are clear.
Heart is borderline enlarged with pulmonary vascularity normal. No
adenopathy. No bone lesions.
IMPRESSION: Left base atelectasis. No frank edema or consolidation. Heart
borderline enlarged.

## 2022-04-10 ENCOUNTER — Telehealth: Payer: Self-pay | Admitting: *Deleted

## 2022-04-10 NOTE — Telephone Encounter (Signed)
No 1 year Ortho bundle call completed as per chart patient is now deceased per outside episode of care at [facility] in October 2022.
